# Patient Record
Sex: Female | Born: 1985
Health system: Southern US, Community
[De-identification: ages and names within clinical notes are randomized; demographics above are authoritative.]

## PROBLEM LIST (undated history)

## (undated) ENCOUNTER — Inpatient Hospital Stay (HOSPITAL_COMMUNITY): Payer: Self-pay

## (undated) DIAGNOSIS — N390 Urinary tract infection, site not specified: Secondary | ICD-10-CM

## (undated) DIAGNOSIS — Z21 Asymptomatic human immunodeficiency virus [HIV] infection status: Secondary | ICD-10-CM

## (undated) DIAGNOSIS — B20 Human immunodeficiency virus [HIV] disease: Secondary | ICD-10-CM

## (undated) DIAGNOSIS — F32A Depression, unspecified: Secondary | ICD-10-CM

## (undated) DIAGNOSIS — K649 Unspecified hemorrhoids: Secondary | ICD-10-CM

## (undated) DIAGNOSIS — K819 Cholecystitis, unspecified: Secondary | ICD-10-CM

## (undated) DIAGNOSIS — N83209 Unspecified ovarian cyst, unspecified side: Secondary | ICD-10-CM

## (undated) DIAGNOSIS — D649 Anemia, unspecified: Secondary | ICD-10-CM

## (undated) DIAGNOSIS — I10 Essential (primary) hypertension: Secondary | ICD-10-CM

## (undated) DIAGNOSIS — O139 Gestational [pregnancy-induced] hypertension without significant proteinuria, unspecified trimester: Secondary | ICD-10-CM

## (undated) DIAGNOSIS — R519 Headache, unspecified: Secondary | ICD-10-CM

## (undated) DIAGNOSIS — Z8742 Personal history of other diseases of the female genital tract: Secondary | ICD-10-CM

## (undated) DIAGNOSIS — A630 Anogenital (venereal) warts: Secondary | ICD-10-CM

## (undated) DIAGNOSIS — D573 Sickle-cell trait: Secondary | ICD-10-CM

## (undated) HISTORY — PX: CHOLECYSTECTOMY: SHX55

## (undated) HISTORY — PX: WISDOM TOOTH EXTRACTION: SHX21

## (undated) HISTORY — DX: Anemia, unspecified: D64.9

## (undated) HISTORY — DX: Anogenital (venereal) warts: A63.0

## (undated) HISTORY — DX: Unspecified hemorrhoids: K64.9

## (undated) HISTORY — DX: Cholecystitis, unspecified: K81.9

## (undated) HISTORY — DX: Gestational (pregnancy-induced) hypertension without significant proteinuria, unspecified trimester: O13.9

---
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
## 2005-02-24 ENCOUNTER — Ambulatory Visit: Payer: Self-pay | Admitting: Family Medicine

## 2005-03-06 ENCOUNTER — Ambulatory Visit: Payer: Self-pay | Admitting: Family Medicine

## 2005-04-02 ENCOUNTER — Ambulatory Visit: Payer: Self-pay | Admitting: Family Medicine

## 2005-04-20 ENCOUNTER — Ambulatory Visit: Payer: Self-pay | Admitting: Family Medicine

## 2005-04-20 ENCOUNTER — Encounter: Admission: RE | Admit: 2005-04-20 | Discharge: 2005-04-20 | Payer: Self-pay | Admitting: Sports Medicine

## 2005-04-20 IMAGING — CR DG RIBS W/ CHEST 3+V*R*
3 series · 3 of 3 positions shown · non-contrast
Comparison: none

CLINICAL DATA: Pain lower right ribs. 
 RIGHT VIEWS ? 2 VIEW AND PA CHEST ? 1 VIEW:

[w chest pa]
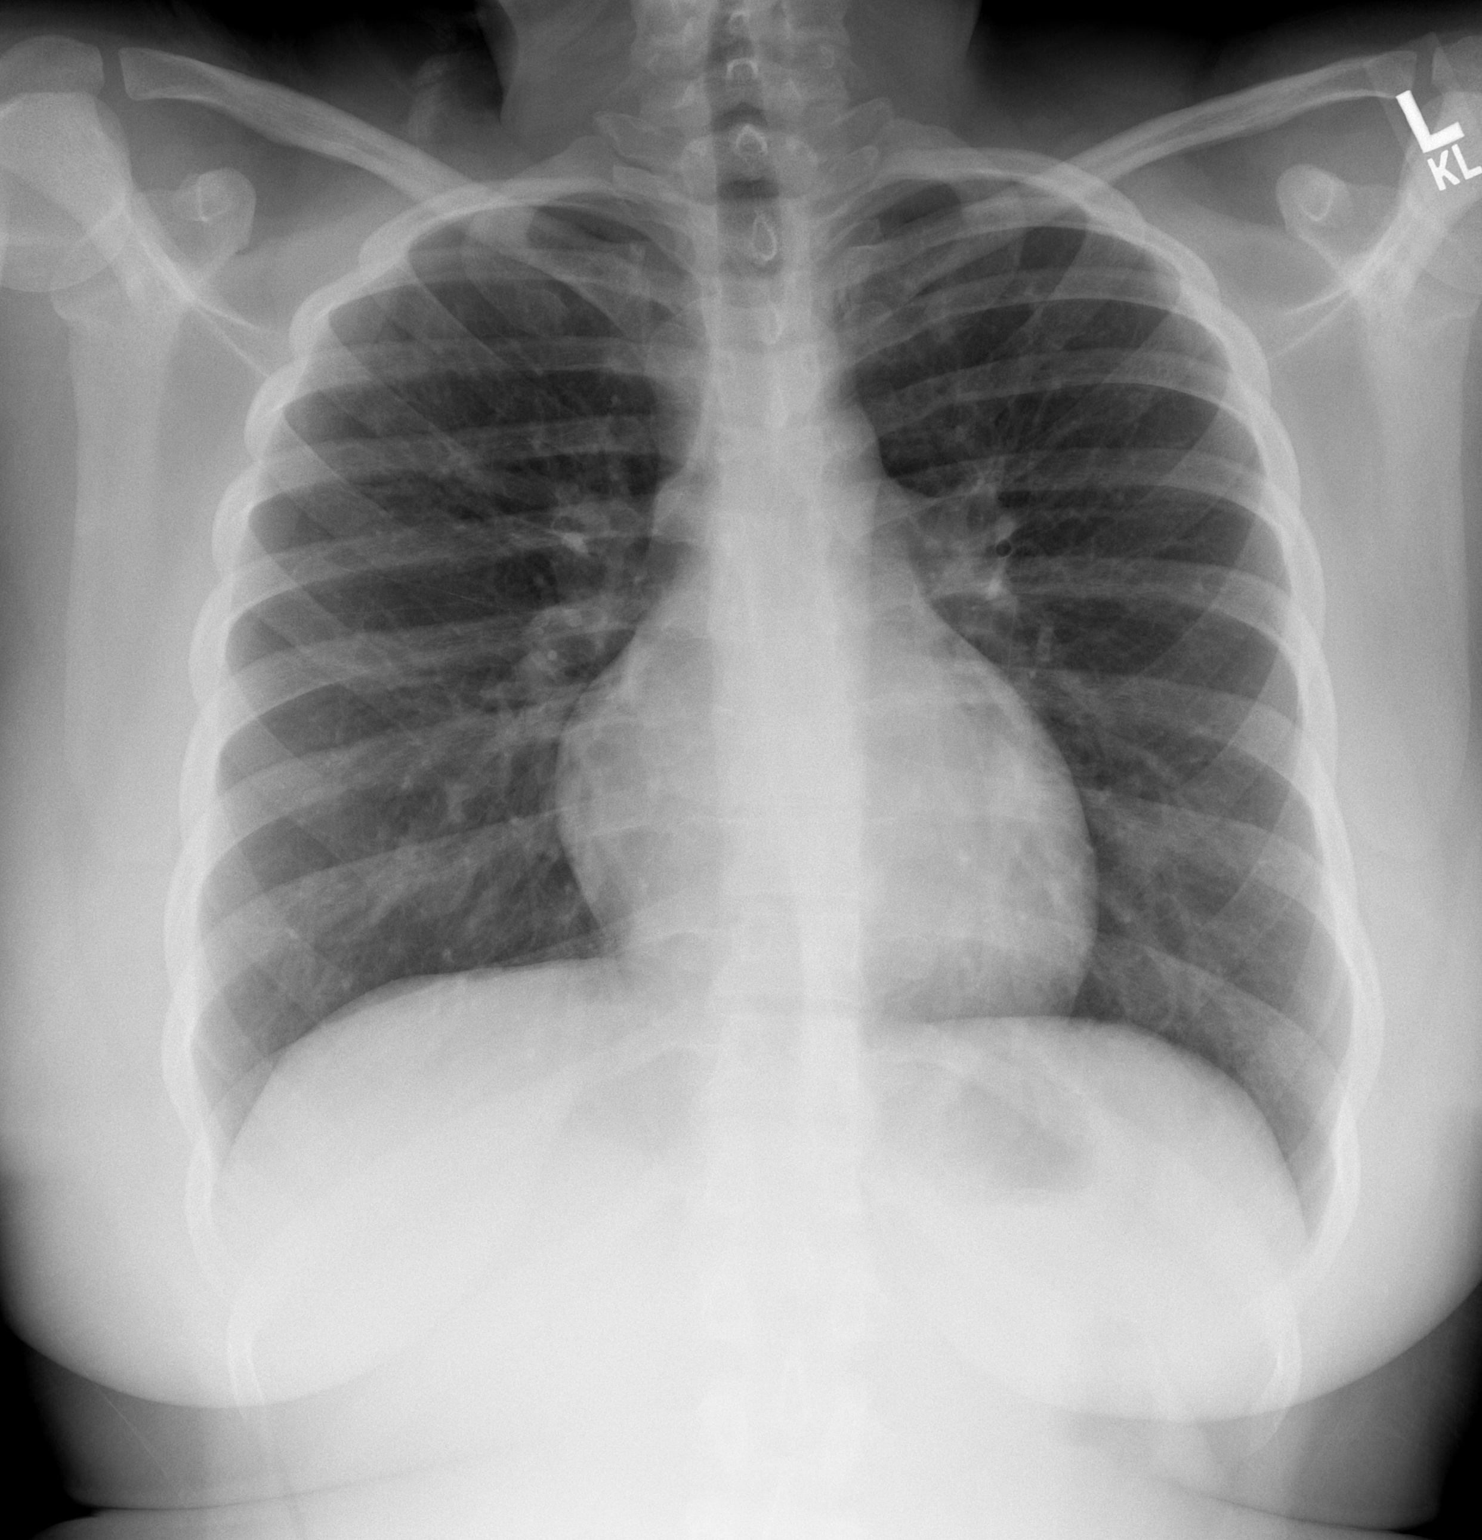

[w ribs ap/pa lower right]
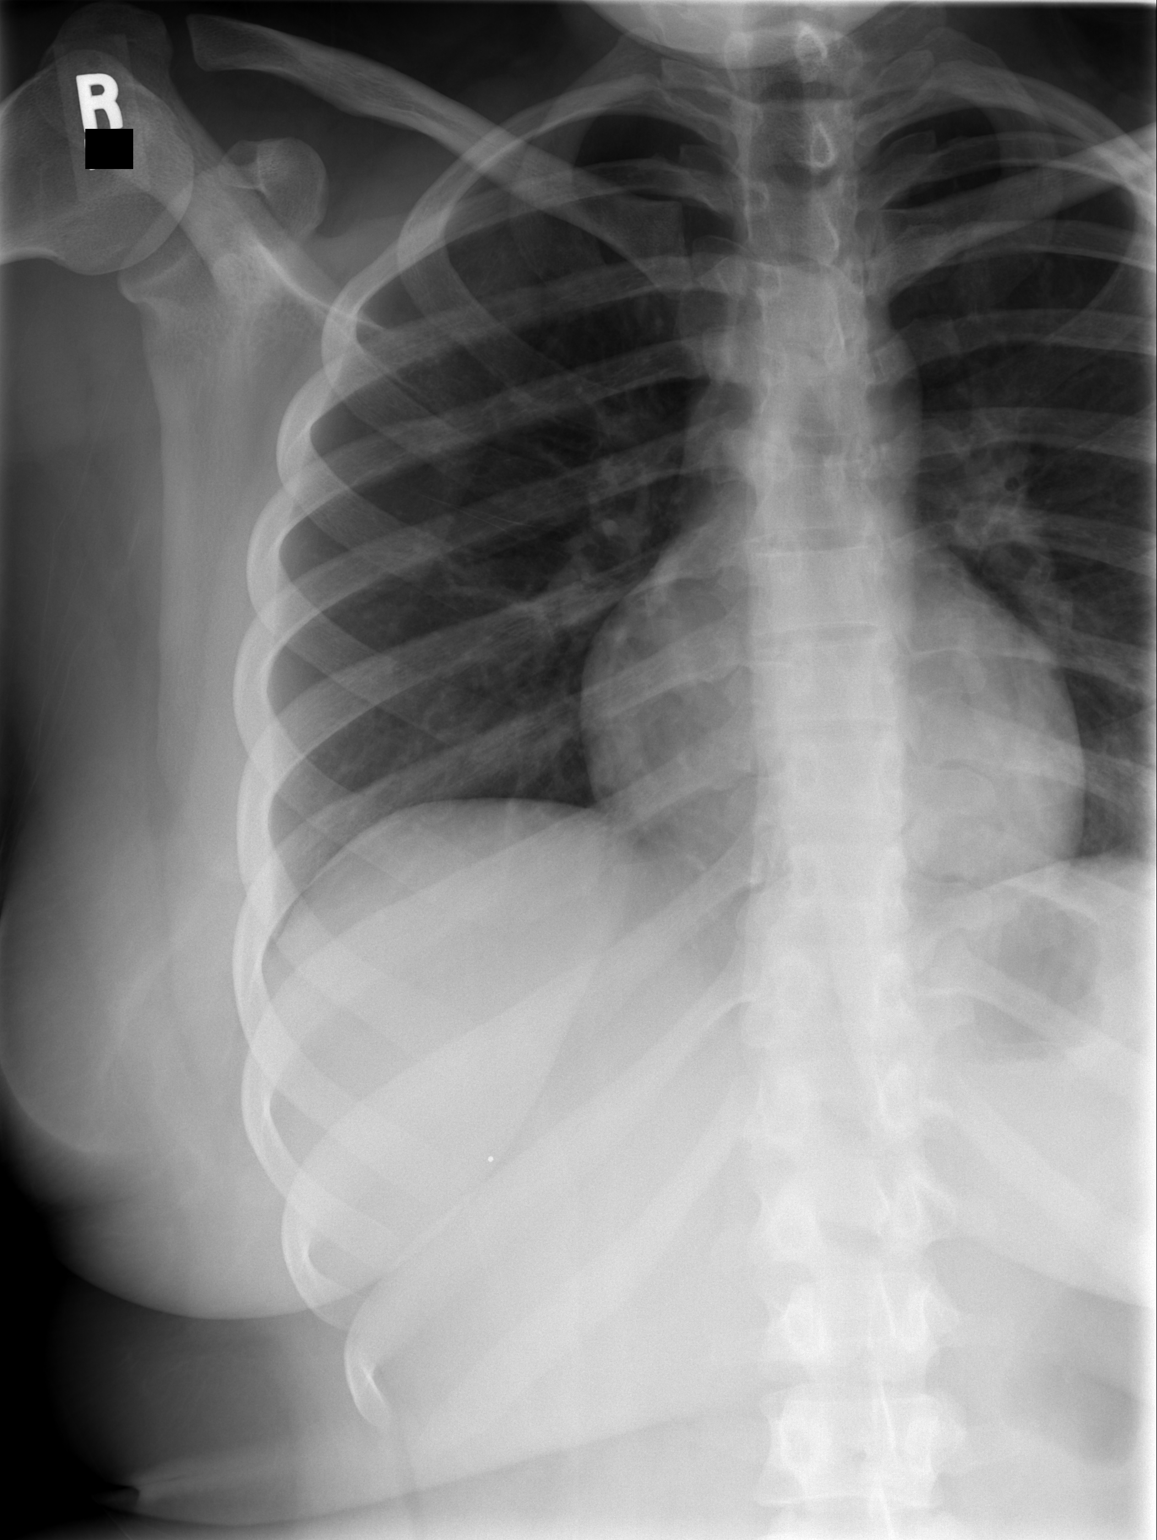

[w ribs oblique right]
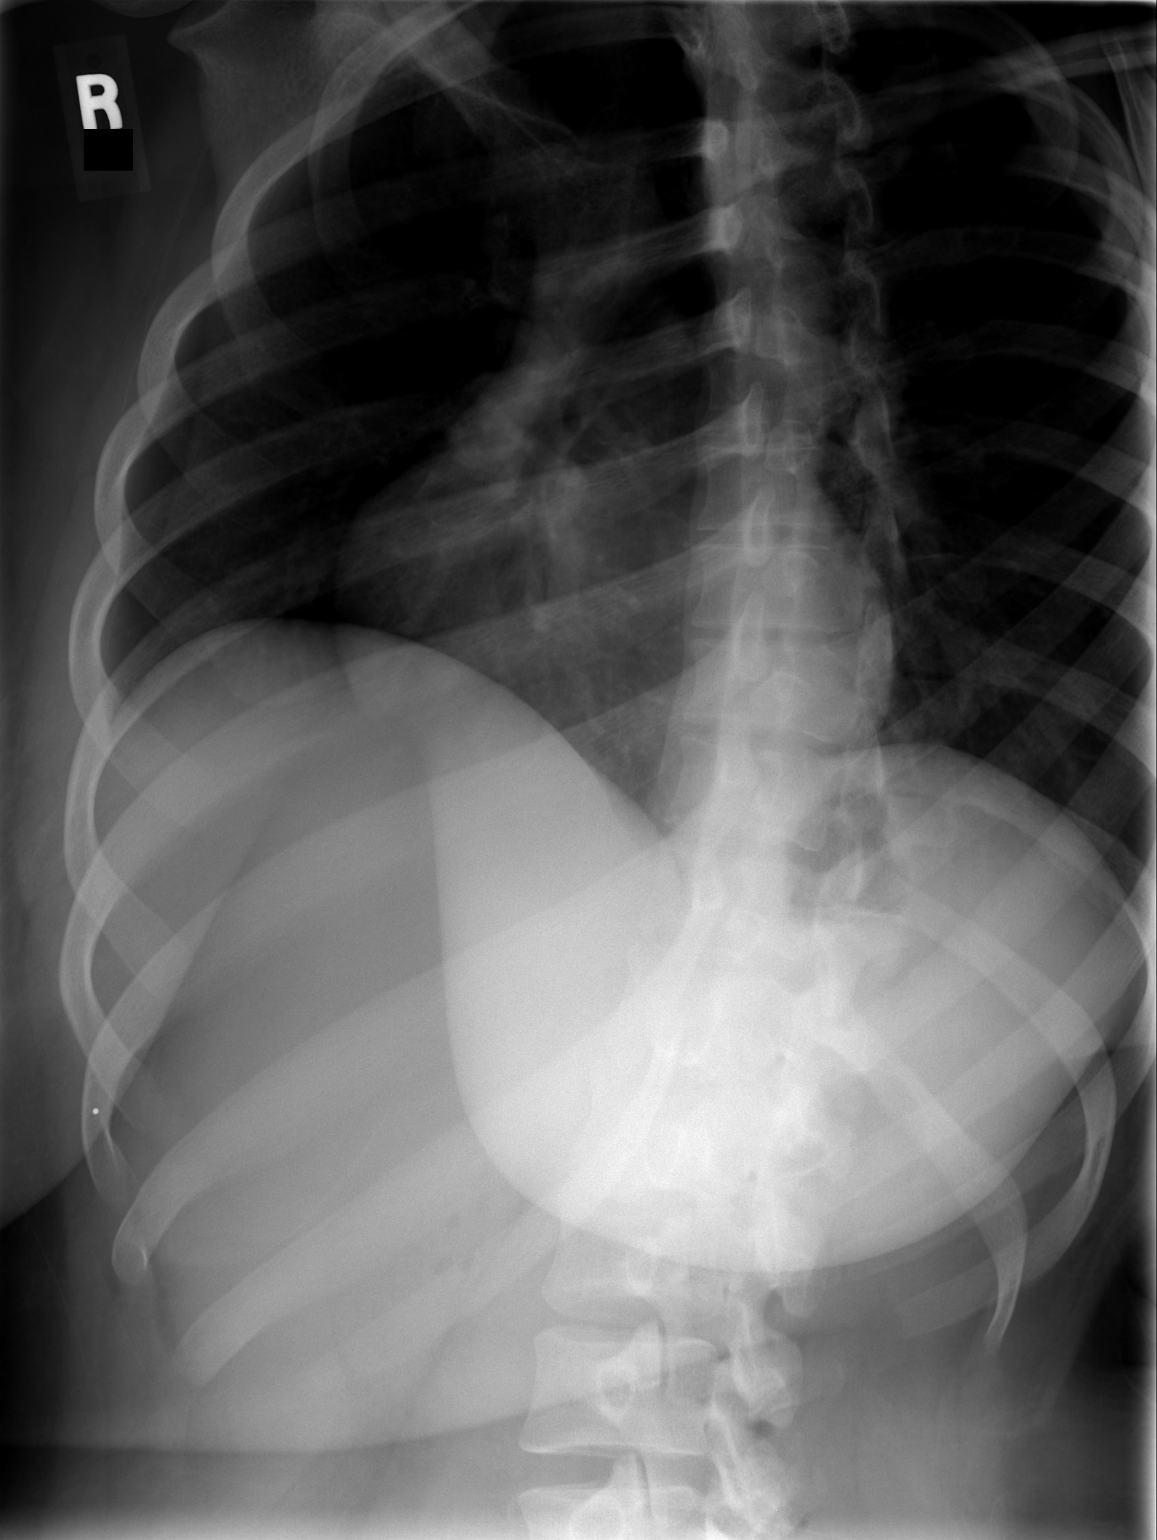

[3 of 3 positions shown; findings below may reference images not displayed]

FINDINGS: There are no fractures.  There are no lytic or blastic lesions, and the bones appear intrinsically normal.  The PA chest view included with the study is normal.
IMPRESSION: Normal study.

## 2005-06-10 ENCOUNTER — Ambulatory Visit: Payer: Self-pay | Admitting: Family Medicine

## 2005-07-10 ENCOUNTER — Ambulatory Visit: Payer: Self-pay | Admitting: Family Medicine

## 2005-07-16 ENCOUNTER — Ambulatory Visit: Payer: Self-pay | Admitting: Family Medicine

## 2005-11-20 ENCOUNTER — Ambulatory Visit: Payer: Self-pay | Admitting: Family Medicine

## 2005-11-26 ENCOUNTER — Ambulatory Visit: Payer: Self-pay | Admitting: Sports Medicine

## 2005-12-11 ENCOUNTER — Ambulatory Visit: Payer: Self-pay | Admitting: Family Medicine

## 2005-12-11 ENCOUNTER — Encounter: Admission: RE | Admit: 2005-12-11 | Discharge: 2005-12-11 | Payer: Self-pay | Admitting: Sports Medicine

## 2006-04-09 ENCOUNTER — Encounter (INDEPENDENT_AMBULATORY_CARE_PROVIDER_SITE_OTHER): Payer: Self-pay | Admitting: *Deleted

## 2006-05-03 ENCOUNTER — Telehealth: Payer: Self-pay | Admitting: *Deleted

## 2006-05-04 ENCOUNTER — Ambulatory Visit: Payer: Self-pay | Admitting: Sports Medicine

## 2006-07-01 ENCOUNTER — Encounter: Payer: Self-pay | Admitting: *Deleted

## 2006-07-01 ENCOUNTER — Ambulatory Visit: Payer: Self-pay | Admitting: Sports Medicine

## 2006-07-01 ENCOUNTER — Encounter: Payer: Self-pay | Admitting: Family Medicine

## 2006-07-01 LAB — CONVERTED CEMR LAB
Nitrite: NEGATIVE
Protein, U semiquant: 30
Urobilinogen, UA: 0.2
pH: 7

## 2006-07-02 ENCOUNTER — Ambulatory Visit: Payer: Self-pay | Admitting: Family Medicine

## 2006-07-02 DIAGNOSIS — N12 Tubulo-interstitial nephritis, not specified as acute or chronic: Secondary | ICD-10-CM | POA: Insufficient documentation

## 2006-07-06 ENCOUNTER — Ambulatory Visit: Payer: Self-pay | Admitting: Family Medicine

## 2006-07-06 ENCOUNTER — Encounter (INDEPENDENT_AMBULATORY_CARE_PROVIDER_SITE_OTHER): Payer: Self-pay | Admitting: Family Medicine

## 2006-07-13 ENCOUNTER — Ambulatory Visit: Payer: Self-pay | Admitting: Sports Medicine

## 2006-07-13 ENCOUNTER — Encounter (INDEPENDENT_AMBULATORY_CARE_PROVIDER_SITE_OTHER): Payer: Self-pay | Admitting: Family Medicine

## 2006-07-13 LAB — CONVERTED CEMR LAB
Blood in Urine, dipstick: NEGATIVE
Protein, U semiquant: NEGATIVE
Specific Gravity, Urine: 1.02

## 2006-10-12 ENCOUNTER — Encounter: Payer: Self-pay | Admitting: Family Medicine

## 2006-10-12 ENCOUNTER — Ambulatory Visit: Payer: Self-pay

## 2006-10-13 LAB — CONVERTED CEMR LAB: Chlamydia, DNA Probe: NEGATIVE

## 2006-11-25 ENCOUNTER — Telehealth (INDEPENDENT_AMBULATORY_CARE_PROVIDER_SITE_OTHER): Payer: Self-pay | Admitting: *Deleted

## 2006-11-26 ENCOUNTER — Encounter (INDEPENDENT_AMBULATORY_CARE_PROVIDER_SITE_OTHER): Payer: Self-pay | Admitting: Family Medicine

## 2006-11-26 ENCOUNTER — Ambulatory Visit: Payer: Self-pay | Admitting: Internal Medicine

## 2006-12-28 ENCOUNTER — Encounter: Payer: Self-pay | Admitting: *Deleted

## 2006-12-28 ENCOUNTER — Ambulatory Visit: Payer: Self-pay | Admitting: Family Medicine

## 2006-12-28 ENCOUNTER — Telehealth (INDEPENDENT_AMBULATORY_CARE_PROVIDER_SITE_OTHER): Payer: Self-pay | Admitting: *Deleted

## 2006-12-29 ENCOUNTER — Encounter (INDEPENDENT_AMBULATORY_CARE_PROVIDER_SITE_OTHER): Payer: Self-pay | Admitting: *Deleted

## 2006-12-30 ENCOUNTER — Telehealth: Payer: Self-pay | Admitting: *Deleted

## 2007-01-12 ENCOUNTER — Encounter: Payer: Self-pay | Admitting: *Deleted

## 2007-03-25 ENCOUNTER — Telehealth: Payer: Self-pay | Admitting: *Deleted

## 2007-03-29 ENCOUNTER — Ambulatory Visit: Payer: Self-pay | Admitting: Family Medicine

## 2007-03-29 DIAGNOSIS — N920 Excessive and frequent menstruation with regular cycle: Secondary | ICD-10-CM | POA: Insufficient documentation

## 2007-03-29 LAB — CONVERTED CEMR LAB
Beta hcg, urine, semiquantitative: NEGATIVE
Bilirubin Urine: NEGATIVE
Hemoglobin: 15.5 g/dL
Ketones, urine, test strip: NEGATIVE
Nitrite: NEGATIVE
Urobilinogen, UA: 0.2

## 2007-11-10 ENCOUNTER — Encounter (INDEPENDENT_AMBULATORY_CARE_PROVIDER_SITE_OTHER): Payer: Self-pay | Admitting: Family Medicine

## 2007-11-10 ENCOUNTER — Ambulatory Visit: Payer: Self-pay | Admitting: Family Medicine

## 2007-11-11 ENCOUNTER — Encounter: Payer: Self-pay | Admitting: Family Medicine

## 2008-01-16 ENCOUNTER — Ambulatory Visit: Payer: Self-pay | Admitting: Family Medicine

## 2008-01-16 ENCOUNTER — Telehealth: Payer: Self-pay | Admitting: *Deleted

## 2008-01-16 ENCOUNTER — Encounter: Payer: Self-pay | Admitting: Family Medicine

## 2008-01-17 ENCOUNTER — Encounter: Payer: Self-pay | Admitting: Family Medicine

## 2008-01-19 ENCOUNTER — Telehealth: Payer: Self-pay | Admitting: *Deleted

## 2008-01-19 LAB — CONVERTED CEMR LAB
Chlamydia, DNA Probe: NEGATIVE
GC Probe Amp, Genital: POSITIVE — AB

## 2008-01-20 ENCOUNTER — Ambulatory Visit: Payer: Self-pay | Admitting: Family Medicine

## 2008-04-04 ENCOUNTER — Emergency Department (HOSPITAL_COMMUNITY): Admission: EM | Admit: 2008-04-04 | Discharge: 2008-04-04 | Payer: Self-pay | Admitting: Emergency Medicine

## 2008-04-04 IMAGING — CT CT HEAD W/O CM
1 series · 16 of 30 positions shown, 20 images · non-contrast
Comparison: None

CLINICAL DATA: Left side headache and left eye pain

CT HEAD WITHOUT CONTRAST
TECHNIQUE: Contiguous axial images were obtained from the base of
the skull through the vertex without contrast.

[Series 2: head_seq 4.5 h37s st · axial · 0.43mm/px · z∈[-147,-21]mm · 16 of 32 slices shown, 20 images]
[im 2/32  brain]
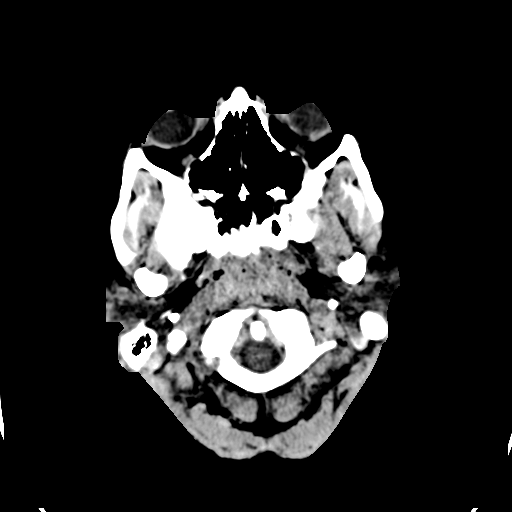
[im 2/32  bone]
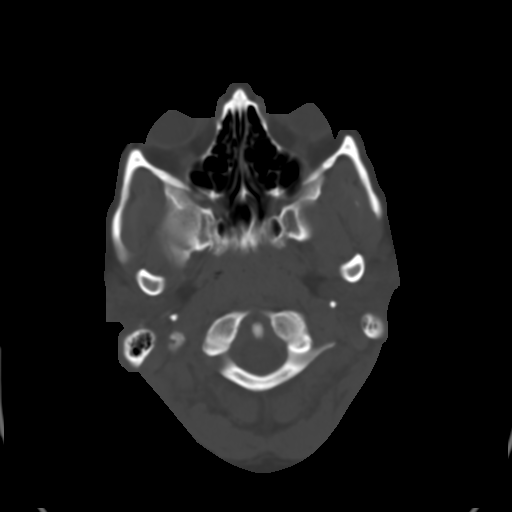
[im 4/32  brain]
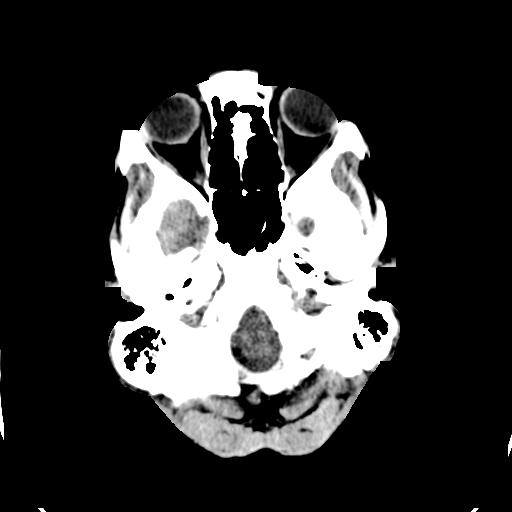
[im 6/32  brain]
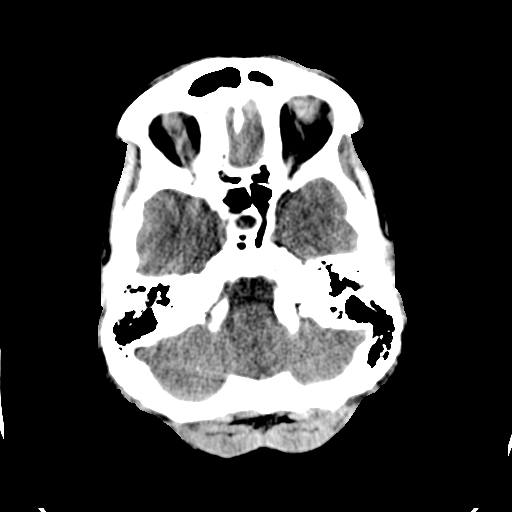
[im 8/32  brain]
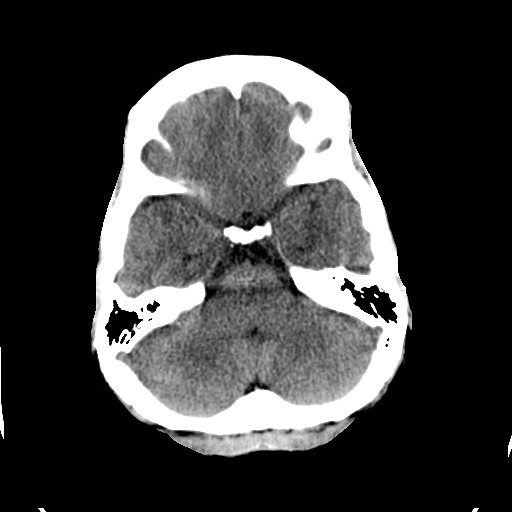
[im 9/32  brain]
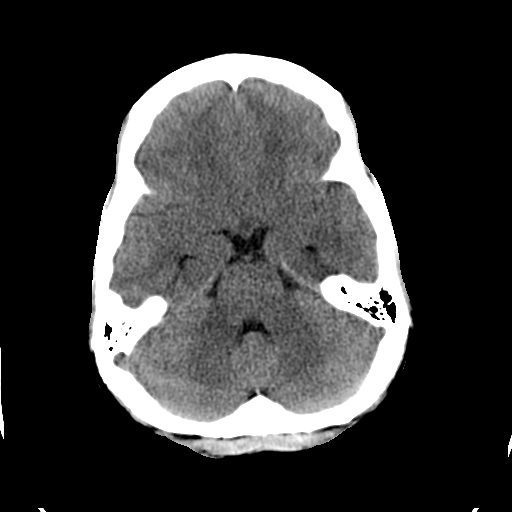
[im 9/32  bone]
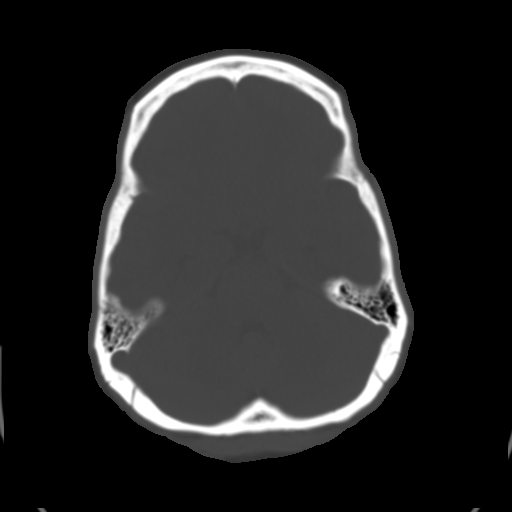
[im 11/32  brain]
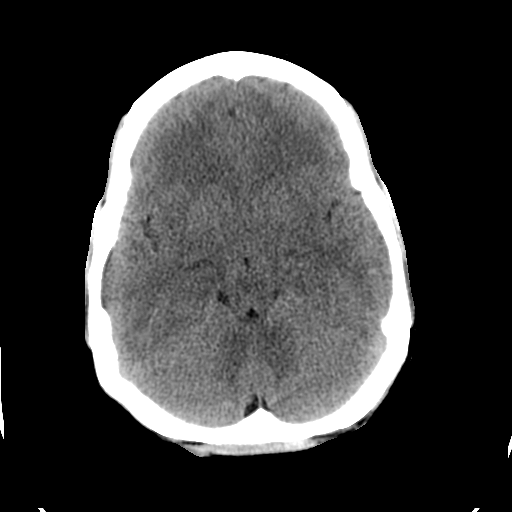
[im 13/32  brain]
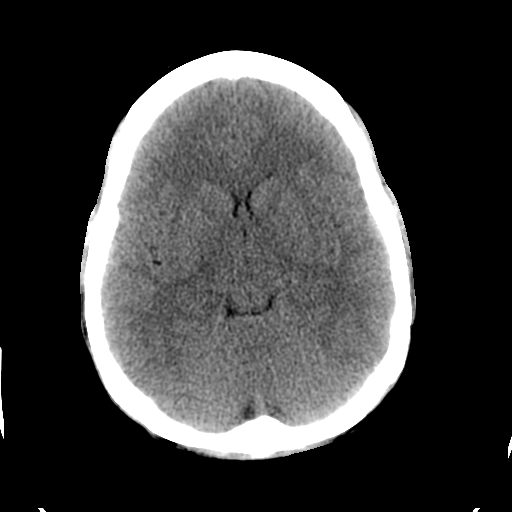
[im 15/32  brain]
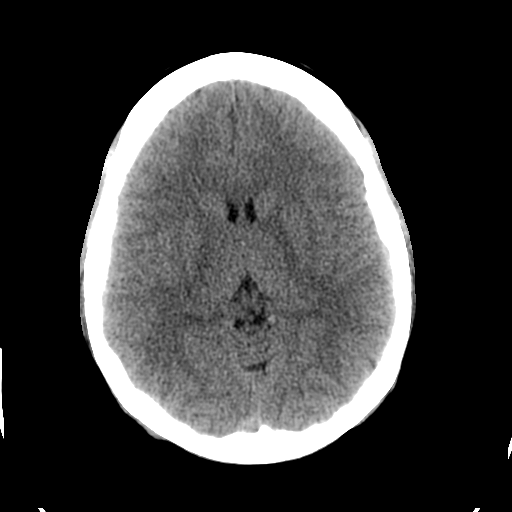
[im 17/32  brain]
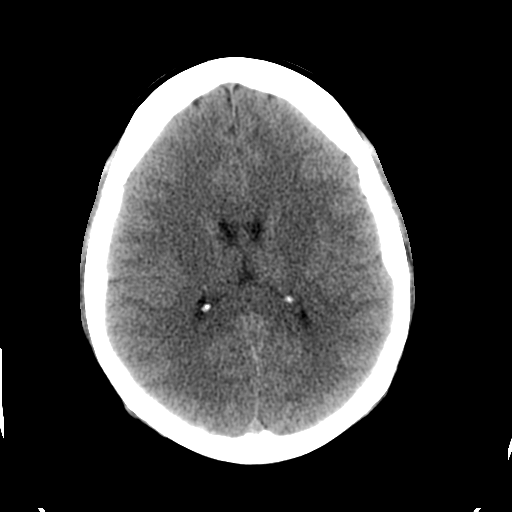
[im 17/32  bone]
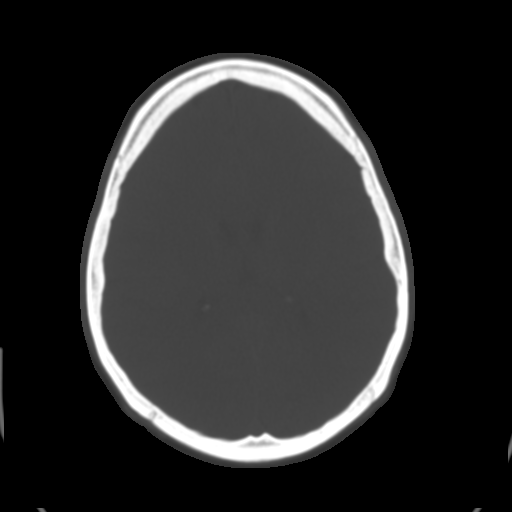
[im 19/32  brain]
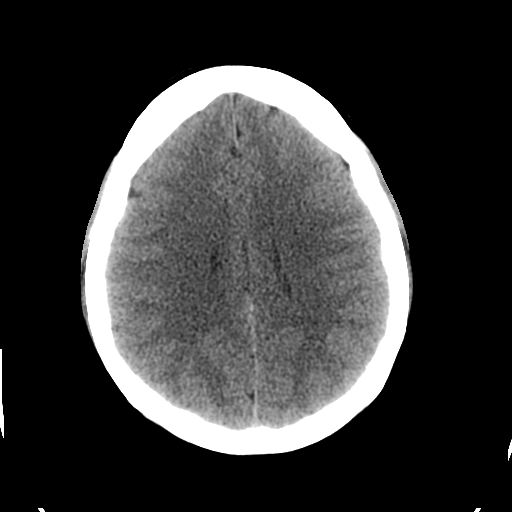
[im 21/32  brain]
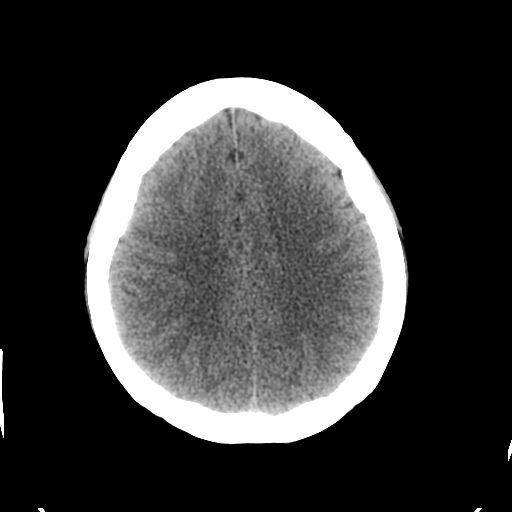
[im 23/32  brain]
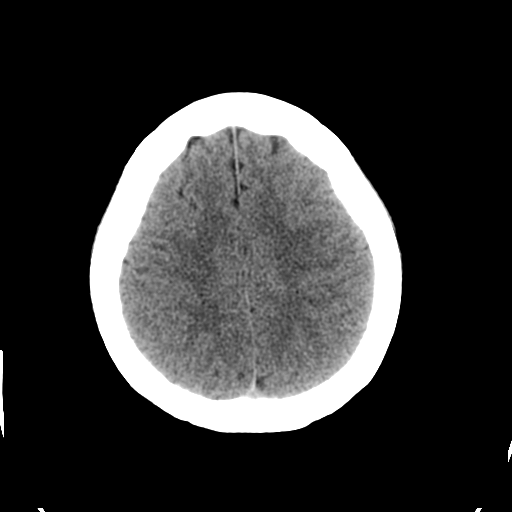
[im 24/32  brain]
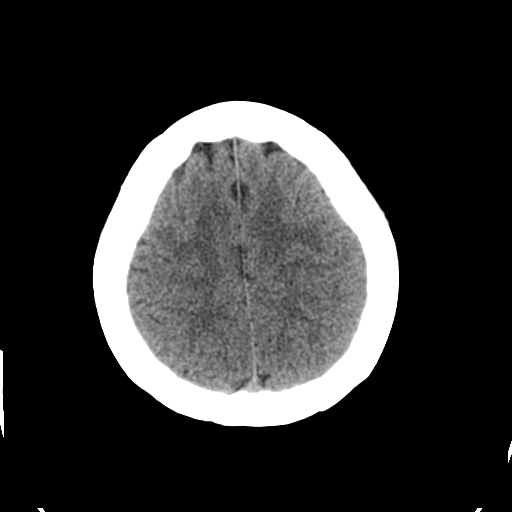
[im 24/32  bone]
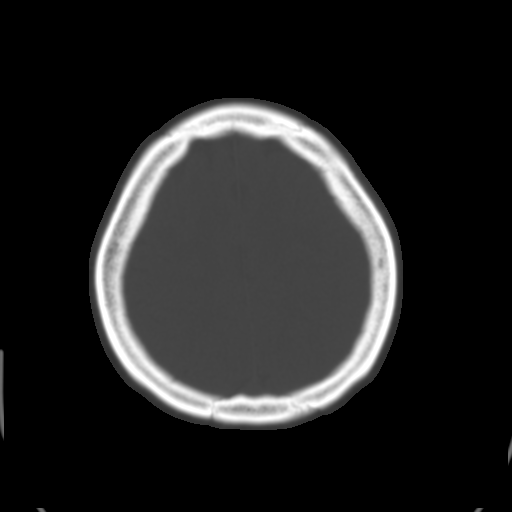
[im 26/32  brain]
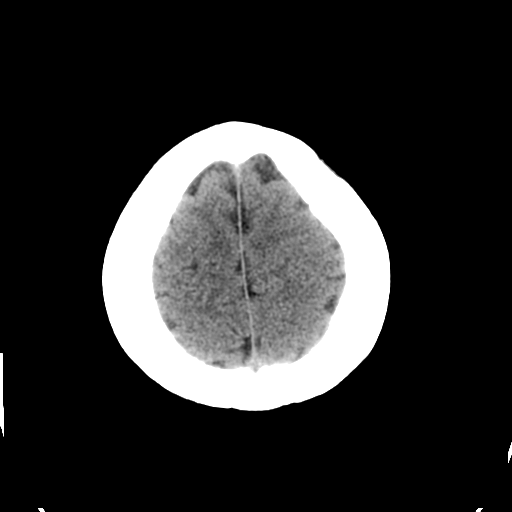
[im 28/32  brain]
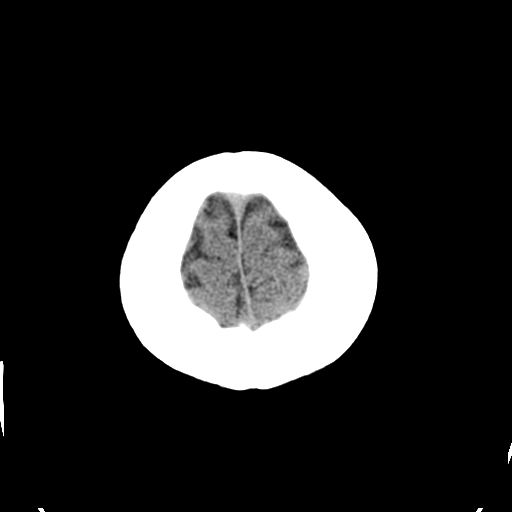
[im 30/32  brain]
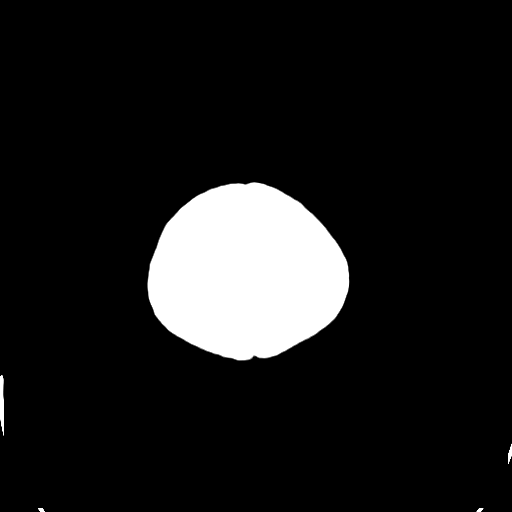

[16 of 30 positions shown; findings below may reference images not displayed]

FINDINGS: Normal ventricular morphology.
No midline shift or mass effect.
Normal appearance of brain parenchyma.
No intracranial hemorrhage, mass lesion, or acute infarct.
Visualized paranasal sinuses and mastoid air cells clear.
Bones unremarkable.
IMPRESSION: No acute intracranial abnormalities.

## 2008-05-09 ENCOUNTER — Encounter (INDEPENDENT_AMBULATORY_CARE_PROVIDER_SITE_OTHER): Payer: Self-pay | Admitting: Family Medicine

## 2008-05-09 ENCOUNTER — Ambulatory Visit: Payer: Self-pay | Admitting: Family Medicine

## 2008-05-09 ENCOUNTER — Other Ambulatory Visit: Admission: RE | Admit: 2008-05-09 | Discharge: 2008-05-09 | Payer: Self-pay | Admitting: Family Medicine

## 2008-05-09 DIAGNOSIS — O99210 Obesity complicating pregnancy, unspecified trimester: Secondary | ICD-10-CM | POA: Insufficient documentation

## 2008-05-09 DIAGNOSIS — A5403 Gonococcal cervicitis, unspecified: Secondary | ICD-10-CM | POA: Insufficient documentation

## 2008-05-09 DIAGNOSIS — F17200 Nicotine dependence, unspecified, uncomplicated: Secondary | ICD-10-CM | POA: Insufficient documentation

## 2008-05-09 DIAGNOSIS — F4321 Adjustment disorder with depressed mood: Secondary | ICD-10-CM | POA: Insufficient documentation

## 2008-05-09 LAB — CONVERTED CEMR LAB
Calcium: 9.1 mg/dL (ref 8.4–10.5)
Chlamydia, DNA Probe: NEGATIVE
Cholesterol: 171 mg/dL (ref 0–200)
GC Probe Amp, Genital: NEGATIVE
Glucose, Bld: 87 mg/dL (ref 70–99)
HDL: 59 mg/dL (ref >39)
LDL Cholesterol: 101 mg/dL — ABNORMAL HIGH (ref 0–99)
Potassium: 4.1 meq/L (ref 3.5–5.3)
Sodium: 141 meq/L (ref 135–145)
TSH: 1.471 microintl units/mL (ref 0.350–4.500)

## 2008-05-14 ENCOUNTER — Encounter (INDEPENDENT_AMBULATORY_CARE_PROVIDER_SITE_OTHER): Payer: Self-pay | Admitting: Family Medicine

## 2008-06-22 ENCOUNTER — Emergency Department (HOSPITAL_COMMUNITY): Admission: EM | Admit: 2008-06-22 | Discharge: 2008-06-22 | Payer: Self-pay | Admitting: Emergency Medicine

## 2008-07-24 ENCOUNTER — Telehealth: Payer: Self-pay | Admitting: Family Medicine

## 2008-09-06 ENCOUNTER — Telehealth: Payer: Self-pay | Admitting: Family Medicine

## 2008-12-13 ENCOUNTER — Emergency Department (HOSPITAL_COMMUNITY): Admission: EM | Admit: 2008-12-13 | Discharge: 2008-12-13 | Payer: Self-pay | Admitting: Emergency Medicine

## 2009-02-20 ENCOUNTER — Emergency Department (HOSPITAL_COMMUNITY): Admission: EM | Admit: 2009-02-20 | Discharge: 2009-02-20 | Payer: Self-pay | Admitting: Emergency Medicine

## 2009-05-07 ENCOUNTER — Telehealth: Payer: Self-pay | Admitting: Family Medicine

## 2009-05-08 ENCOUNTER — Ambulatory Visit: Payer: Self-pay | Admitting: Family Medicine

## 2009-05-08 ENCOUNTER — Encounter: Payer: Self-pay | Admitting: Family Medicine

## 2009-05-08 LAB — CONVERTED CEMR LAB
Beta hcg, urine, semiquantitative: NEGATIVE
Chlamydia, DNA Probe: POSITIVE — AB

## 2009-05-09 ENCOUNTER — Telehealth (INDEPENDENT_AMBULATORY_CARE_PROVIDER_SITE_OTHER): Payer: Self-pay | Admitting: *Deleted

## 2009-05-09 ENCOUNTER — Telehealth: Payer: Self-pay | Admitting: Family Medicine

## 2009-05-09 ENCOUNTER — Encounter: Payer: Self-pay | Admitting: Family Medicine

## 2009-06-22 ENCOUNTER — Emergency Department (HOSPITAL_COMMUNITY): Admission: EM | Admit: 2009-06-22 | Discharge: 2009-06-22 | Payer: Self-pay | Admitting: Emergency Medicine

## 2009-06-22 IMAGING — US US OB TRANSVAGINAL MODIFY
1 series · 13 of 28 positions shown · non-contrast
Comparison: None this gestation.

CLINICAL DATA: 23-year-old G2 P1, unknown LMP, presenting with
pelvic pain.  Quantitative beta HCG pending.

OBSTETRIC <14 WK US AND TRANSVAGINAL OB US [DATE]:
TECHNIQUE: Both transabdominal and transvaginal ultrasound
examinations were performed for complete evaluation of the
gestation as well as the maternal uterus, adnexal regions, and
pelvic cul-de-sac.  Transvaginal imaging was performed as the
transabdominal images did not provide adequate detail.

[Series 1: us ob transvaginal modify · 0.30mm/px · 62 acquisitions, 13 frames shown]
[im 3/62]
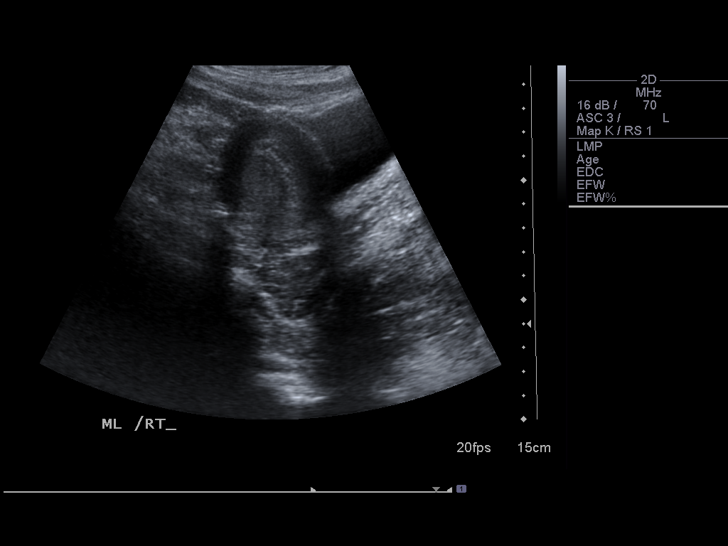
[im 7/62]
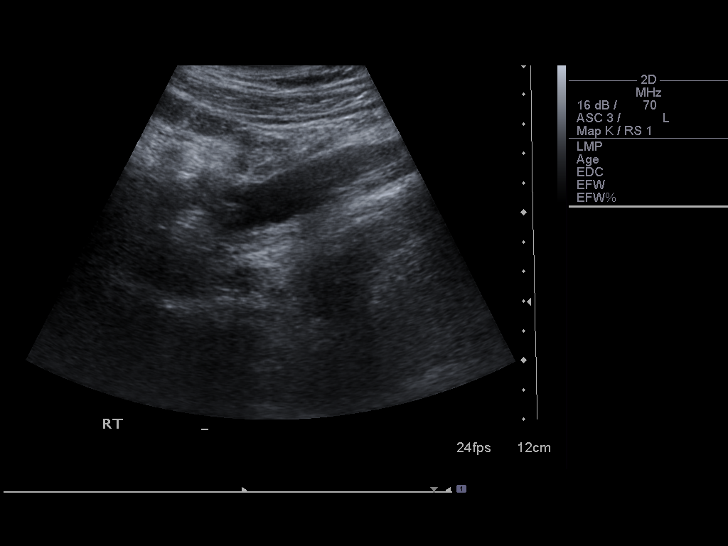
[im 12/62]
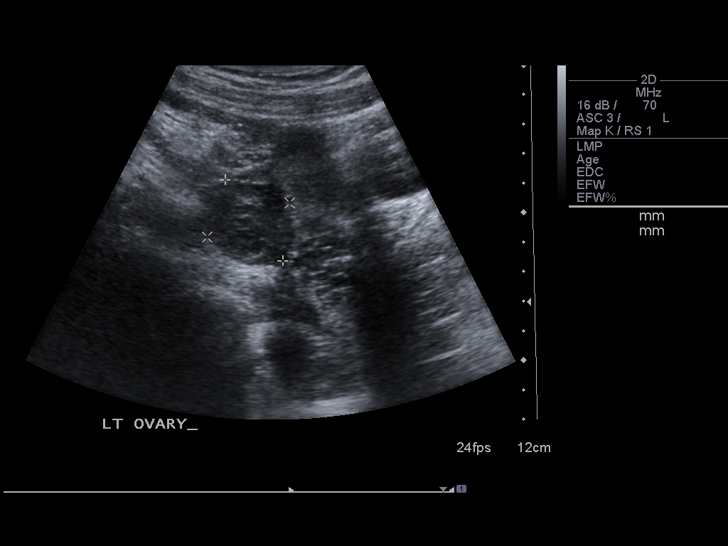
[im 16/62]
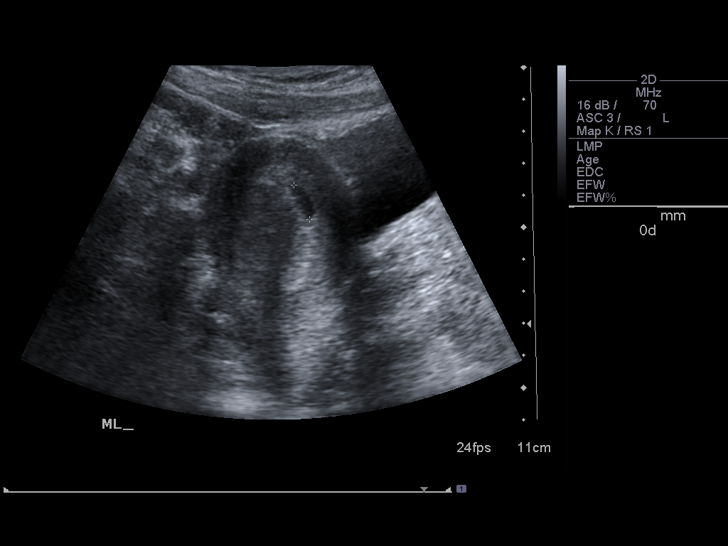
[im 21/62]
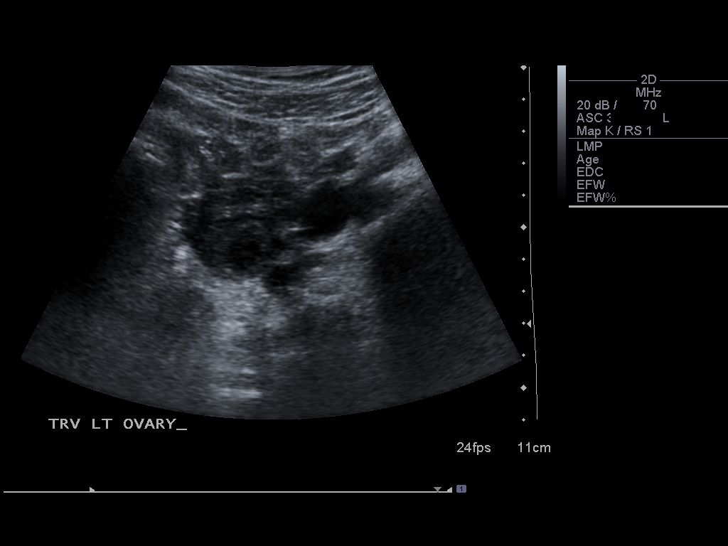
[im 25/62]
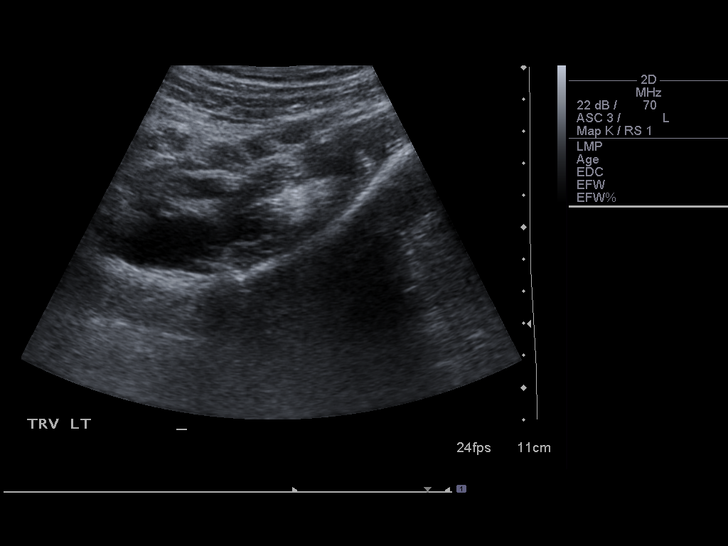
[im 32/62]
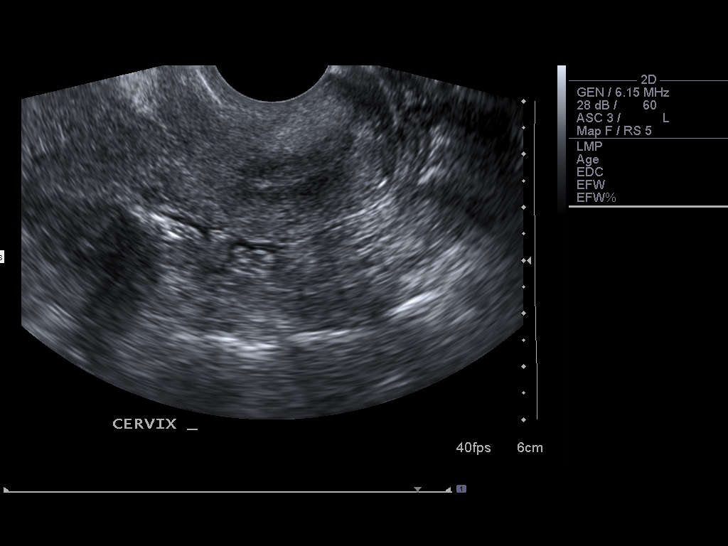
[im 37/62]
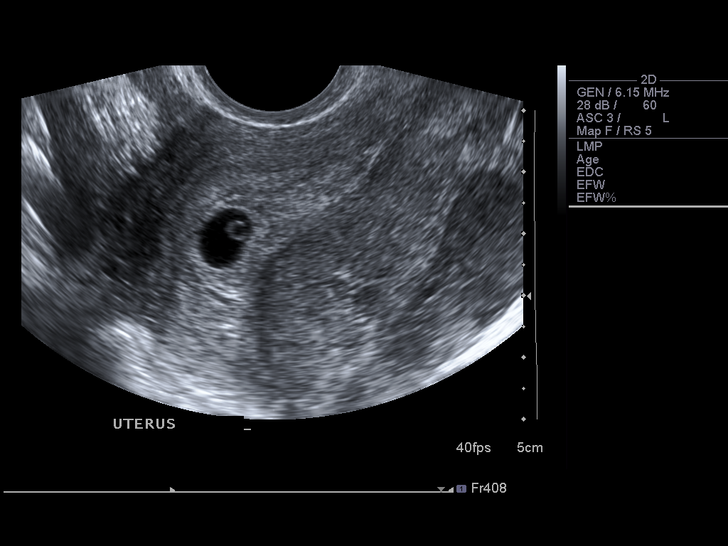
[im 41/62]
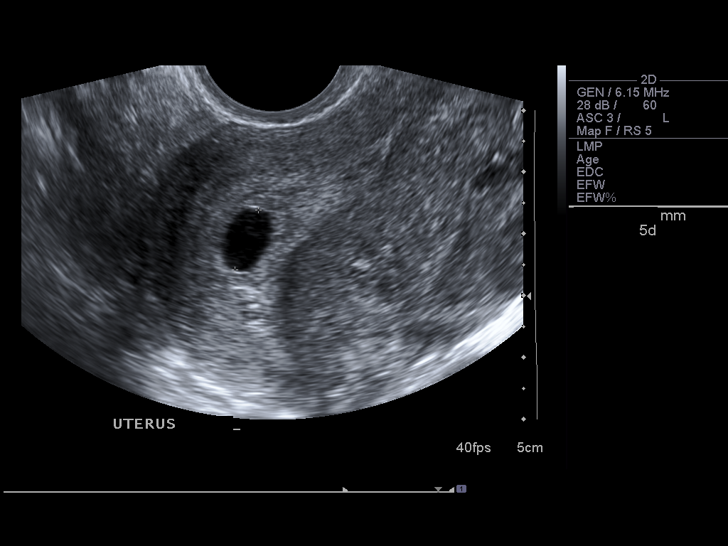
[im 46/62]
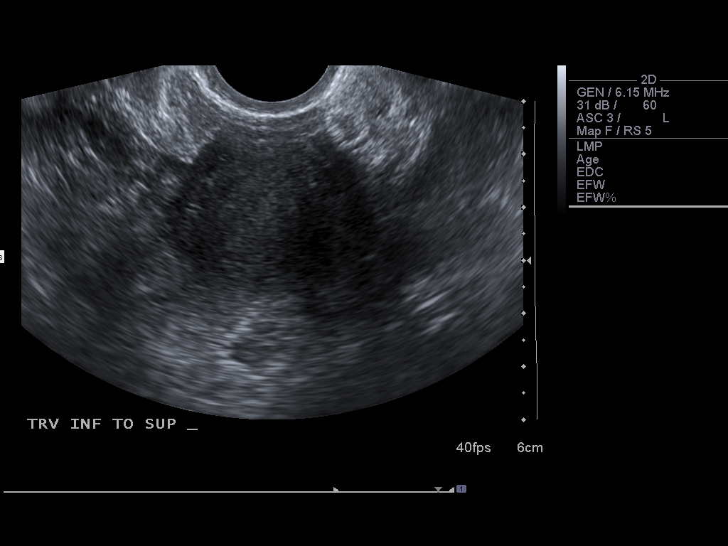
[im 50/62]
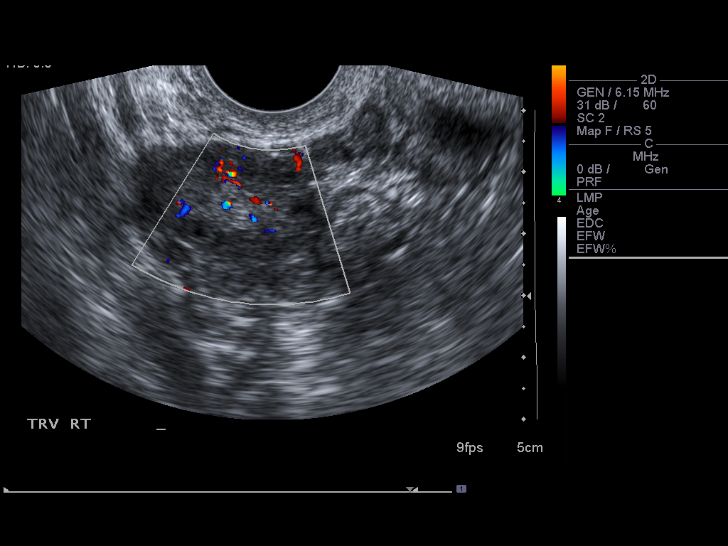
[im 55/62]
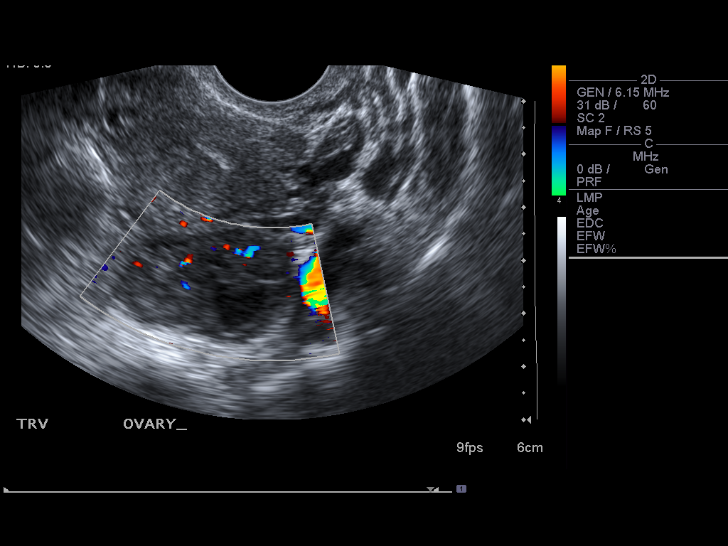
[im 59/62]
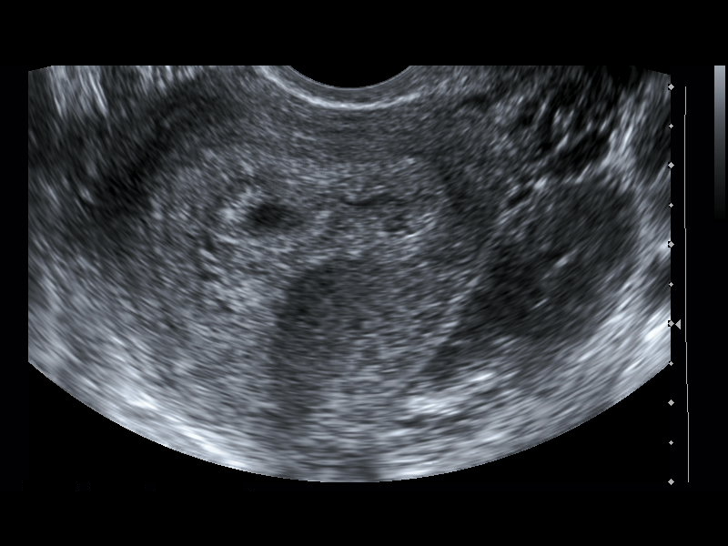

[13 of 28 positions shown; findings below may reference images not displayed]

Intrauterine gestational sac: Single.
Yolk sac: Visualized.
Embryo: Not yet visualized.
Cardiac Activity: Not yet visualized.
Heart Rate: Not applicable.

MSD: 9.9  mm  5 w 5 d
US EDC: [DATE].

Maternal uterus/adnexae:
No subchorionic hemorrhage.  Right ovary normal in appearance
containing several small follicular cysts.  Normal color Doppler
signal within the right ovary.  Left ovary normal in appearance,
containing an approximate 1.1 cm hemorrhagic corpus luteum cyst.
Normal color Doppler signal within the left ovary.  No adnexal
masses or free fluid.
IMPRESSION: 1.  Single intrauterine gestational sac containing a yolk sac.
Embryo not yet visualized. Estimated gestational age of 5 weeks 5
days by mean sac diameter.  It is therefore too early to visualize
the embryo.
2.  Likely hemorrhagic corpus luteum cyst within the left ovary.

## 2009-06-25 ENCOUNTER — Ambulatory Visit: Payer: Self-pay | Admitting: Family Medicine

## 2009-06-25 LAB — CONVERTED CEMR LAB: Beta hcg, urine, semiquantitative: POSITIVE

## 2009-06-26 ENCOUNTER — Ambulatory Visit: Payer: Self-pay | Admitting: Family Medicine

## 2009-06-26 ENCOUNTER — Encounter: Payer: Self-pay | Admitting: Family Medicine

## 2009-06-26 LAB — CONVERTED CEMR LAB
Antibody Screen: NEGATIVE
Basophils Relative: 0 % (ref 0–1)
Hemoglobin: 11.3 g/dL — ABNORMAL LOW (ref 12.0–15.0)
Lymphocytes Relative: 46 % (ref 12–46)
MCV: 79.7 fL (ref 78.0–100.0)
Monocytes Absolute: 0.4 10*3/uL (ref 0.1–1.0)
Neutrophils Relative %: 45 % (ref 43–77)
Platelets: 210 10*3/uL (ref 150–400)
RBC: 4.34 M/uL (ref 3.87–5.11)
Rh Type: POSITIVE
Rubella: 56.9 intl units/mL — ABNORMAL HIGH
Sickle Cell Screen: POSITIVE — AB

## 2009-06-27 ENCOUNTER — Encounter: Payer: Self-pay | Admitting: Family Medicine

## 2009-07-04 ENCOUNTER — Encounter: Payer: Self-pay | Admitting: Family Medicine

## 2009-07-04 ENCOUNTER — Inpatient Hospital Stay (HOSPITAL_COMMUNITY): Admission: AD | Admit: 2009-07-04 | Discharge: 2009-07-04 | Payer: Self-pay | Admitting: Family Medicine

## 2009-07-04 IMAGING — US US OB TRANSVAGINAL
1 series · 14 of 28 positions shown · non-contrast
Comparison: none

OBSTETRICAL ULTRASOUND:
 This ultrasound exam was performed in the [HOSPITAL] Ultrasound Department.  The OB US report was generated in the AS system, and faxed to the ordering physician.  This report is also available in [HOSPITAL]?s Access[employer]ware and in [REDACTED] PACS.

[Series 1: us ob transvaginal · 0.15mm/px · 14 of 38 slices shown]
[im 2/38]
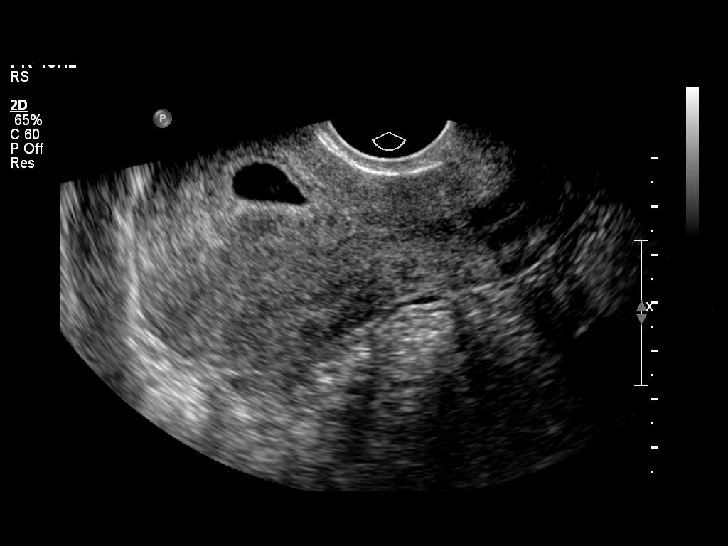
[im 5/38]
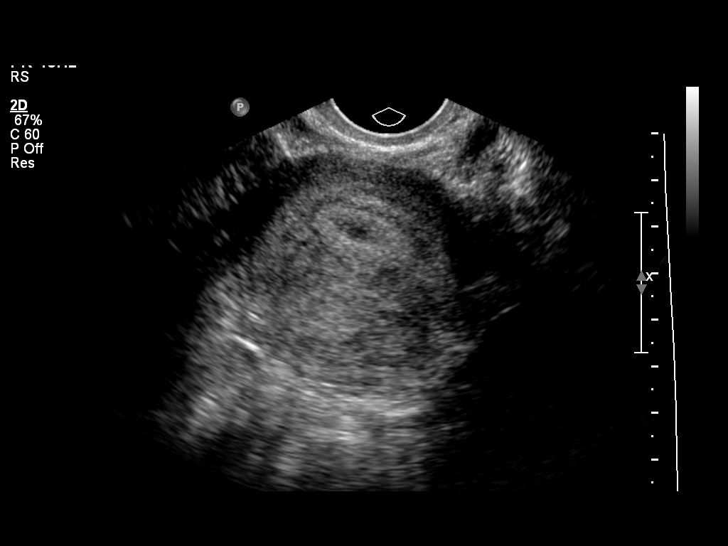
[im 7/38]
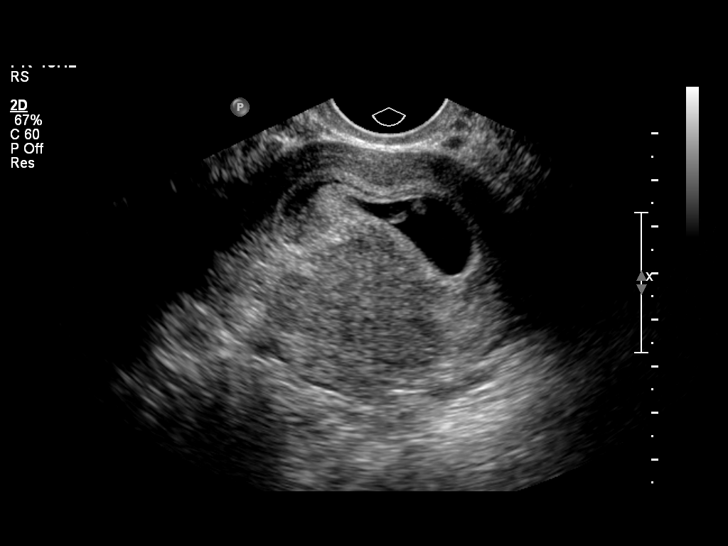
[im 10/38]
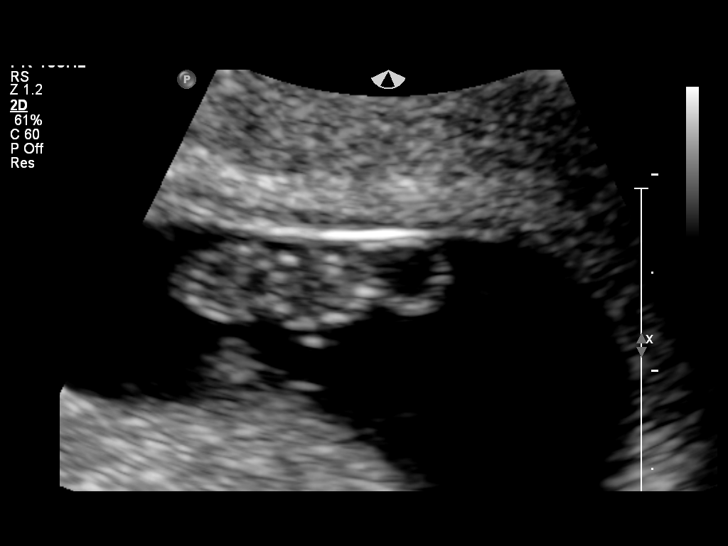
[im 13/38]
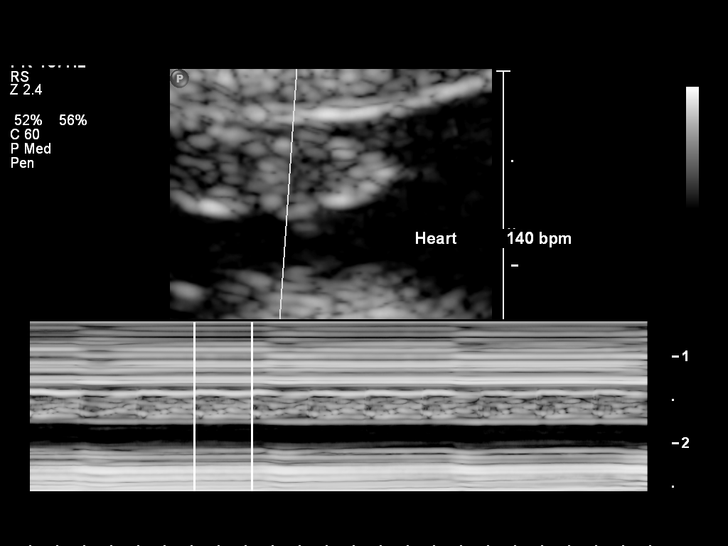
[im 16/38]
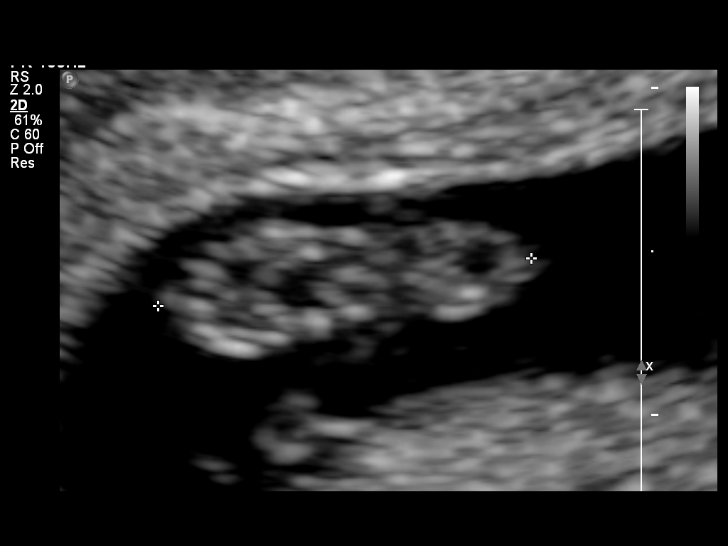
[im 18/38]
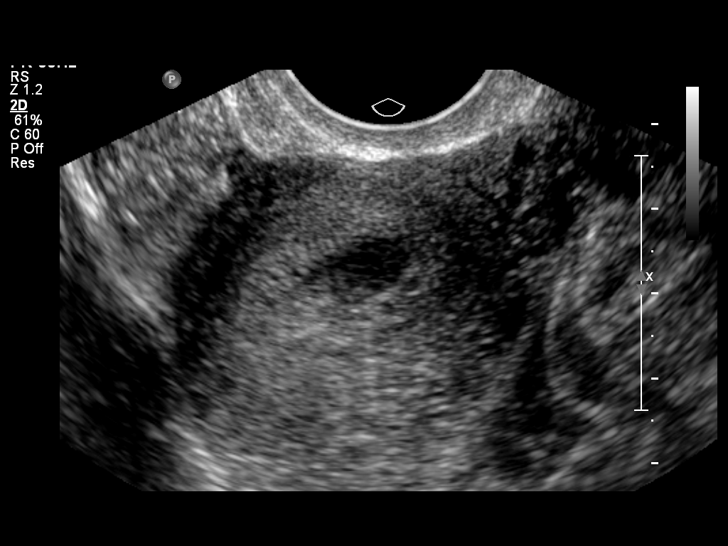
[im 21/38]
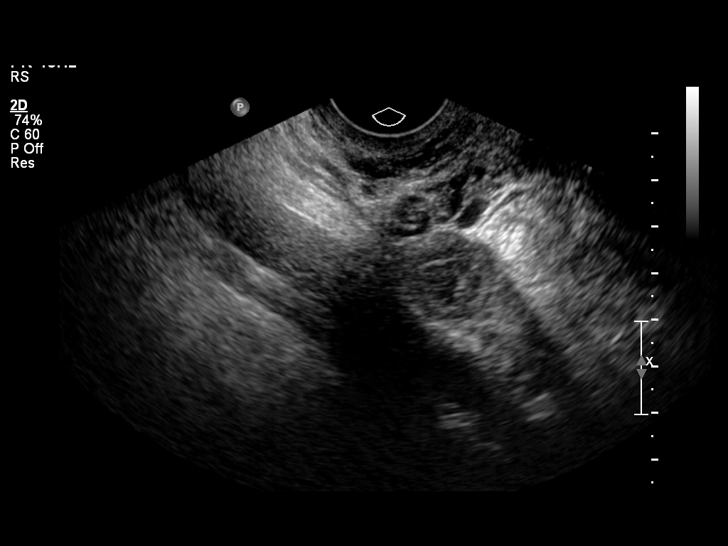
[im 24/38]
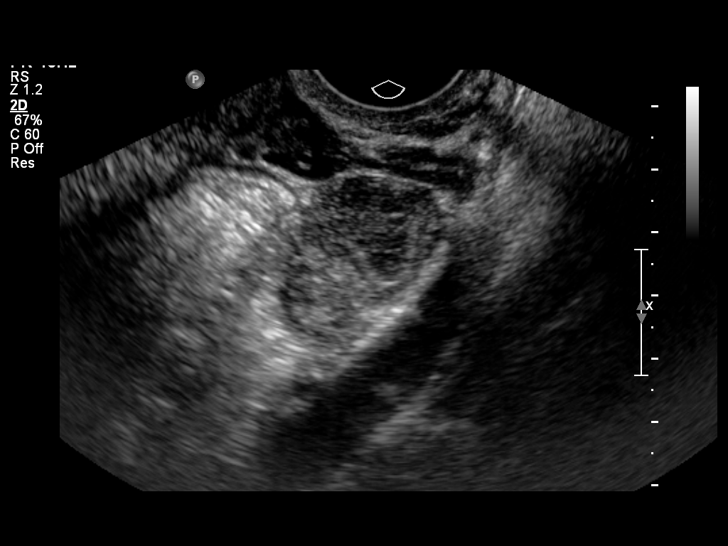
[im 27/38]
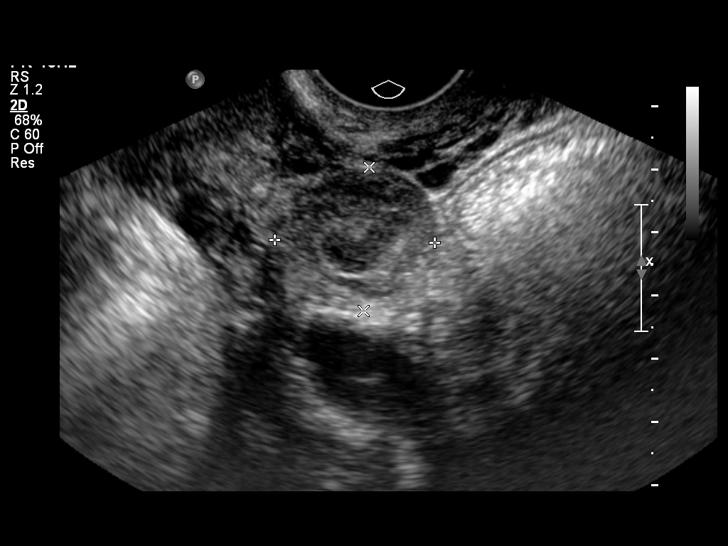
[im 29/38]
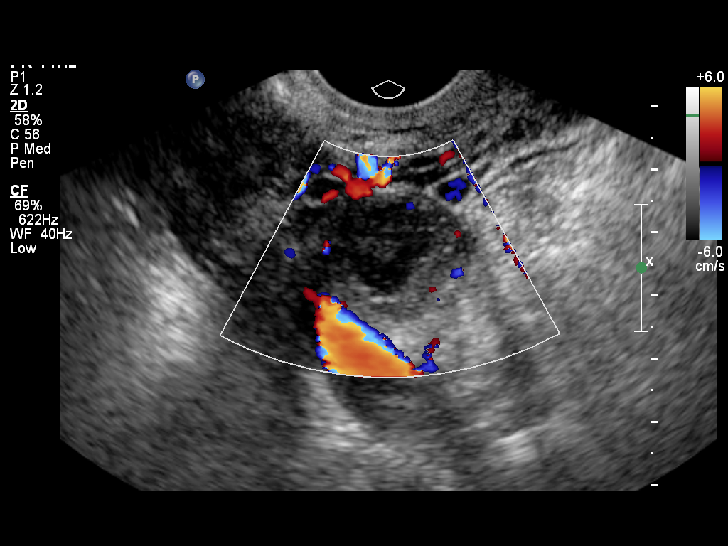
[im 32/38]
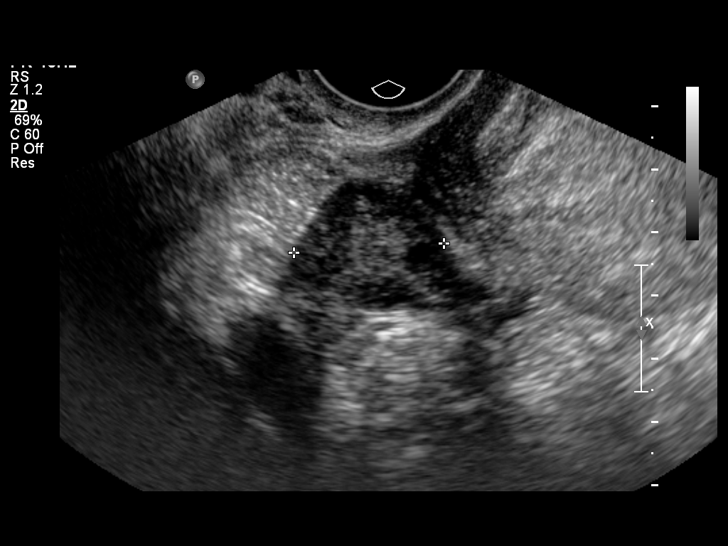
[im 35/38]
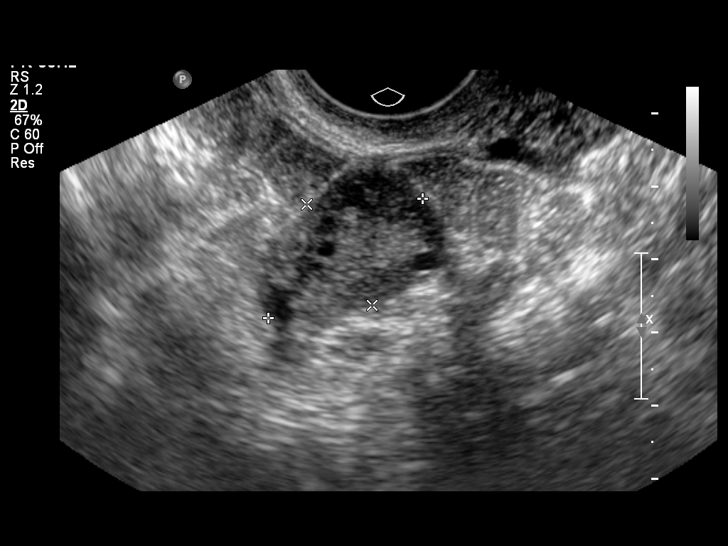
[im 38/38]
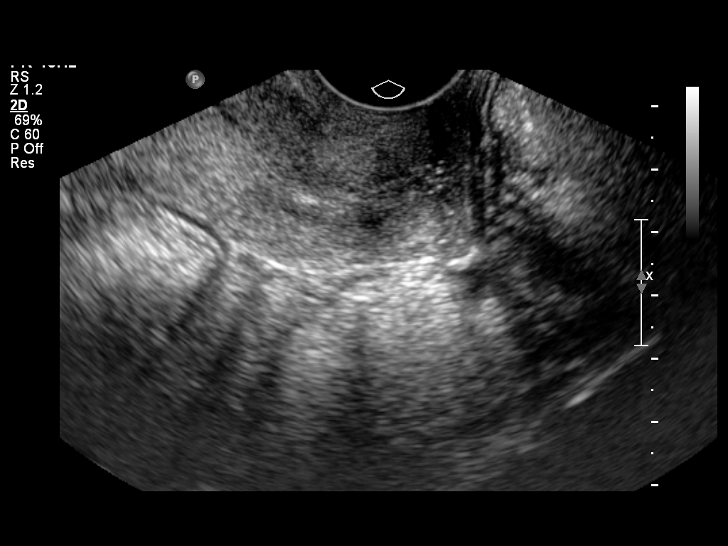

[14 of 28 positions shown; findings below may reference images not displayed]

IMPRESSION: See AS Obstetric US report.

## 2009-07-10 ENCOUNTER — Telehealth: Payer: Self-pay | Admitting: Family Medicine

## 2009-07-10 ENCOUNTER — Encounter: Payer: Self-pay | Admitting: Family Medicine

## 2009-07-10 ENCOUNTER — Ambulatory Visit: Payer: Self-pay | Admitting: Family Medicine

## 2009-07-10 ENCOUNTER — Other Ambulatory Visit: Admission: RE | Admit: 2009-07-10 | Discharge: 2009-07-10 | Payer: Self-pay | Admitting: Family Medicine

## 2009-07-10 LAB — CONVERTED CEMR LAB
Chlamydia, DNA Probe: NEGATIVE
GC Probe Amp, Genital: NEGATIVE
Pap Smear: NEGATIVE

## 2009-07-26 ENCOUNTER — Telehealth: Payer: Self-pay | Admitting: Family Medicine

## 2009-08-13 ENCOUNTER — Ambulatory Visit: Payer: Self-pay | Admitting: Family Medicine

## 2009-08-13 DIAGNOSIS — K089 Disorder of teeth and supporting structures, unspecified: Secondary | ICD-10-CM | POA: Insufficient documentation

## 2009-08-13 LAB — CONVERTED CEMR LAB
Urobilinogen, UA: 0.2
WBC Urine, dipstick: NEGATIVE

## 2009-08-21 ENCOUNTER — Telehealth (INDEPENDENT_AMBULATORY_CARE_PROVIDER_SITE_OTHER): Payer: Self-pay | Admitting: *Deleted

## 2009-08-22 ENCOUNTER — Ambulatory Visit: Payer: Self-pay | Admitting: Family Medicine

## 2009-08-22 DIAGNOSIS — S025XXA Fracture of tooth (traumatic), initial encounter for closed fracture: Secondary | ICD-10-CM | POA: Insufficient documentation

## 2009-09-10 ENCOUNTER — Encounter: Payer: Self-pay | Admitting: Family Medicine

## 2009-09-10 ENCOUNTER — Ambulatory Visit: Payer: Self-pay | Admitting: Family Medicine

## 2009-09-10 DIAGNOSIS — R300 Dysuria: Secondary | ICD-10-CM

## 2009-09-10 DIAGNOSIS — N898 Other specified noninflammatory disorders of vagina: Secondary | ICD-10-CM | POA: Insufficient documentation

## 2009-09-10 LAB — CONVERTED CEMR LAB
Bilirubin Urine: NEGATIVE
Blood in Urine, dipstick: NEGATIVE
Glucose, Urine, Semiquant: NEGATIVE
Ketones, urine, test strip: NEGATIVE
Nitrite: NEGATIVE
Protein, U semiquant: NEGATIVE
Urobilinogen, UA: 0.2
WBC Urine, dipstick: NEGATIVE
pH: 6

## 2009-09-11 ENCOUNTER — Encounter: Payer: Self-pay | Admitting: Family Medicine

## 2009-09-12 ENCOUNTER — Ambulatory Visit: Payer: Self-pay | Admitting: Family Medicine

## 2009-09-17 ENCOUNTER — Telehealth: Payer: Self-pay | Admitting: *Deleted

## 2009-09-17 ENCOUNTER — Ambulatory Visit (HOSPITAL_COMMUNITY): Admission: RE | Admit: 2009-09-17 | Discharge: 2009-09-17 | Payer: Self-pay | Admitting: Family Medicine

## 2009-09-17 ENCOUNTER — Encounter: Payer: Self-pay | Admitting: Family Medicine

## 2009-09-17 IMAGING — US US OB DETAIL+14 WK
2 series · 14 of 28 positions shown · non-contrast
Comparison: none

OBSTETRICAL ULTRASOUND:
 This ultrasound exam was performed in the [HOSPITAL] Ultrasound Department.  The OB US report was generated in the AS system, and faxed to the ordering physician.  This report is also available in [HOSPITAL]?s [employer] and in [REDACTED] PACS.

[Series 1: us ob detail +14 wk · 0.25mm/px · 55 acquisitions, 13 frames shown (1 of 2)]
[im 3/55]
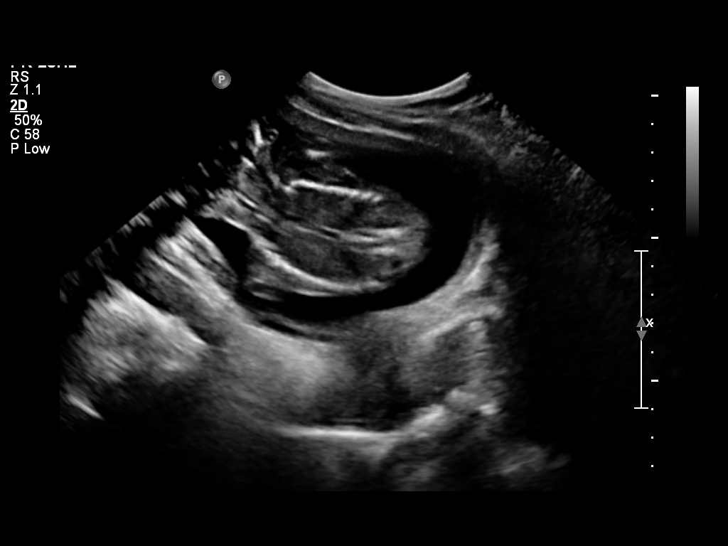
[im 7/55]
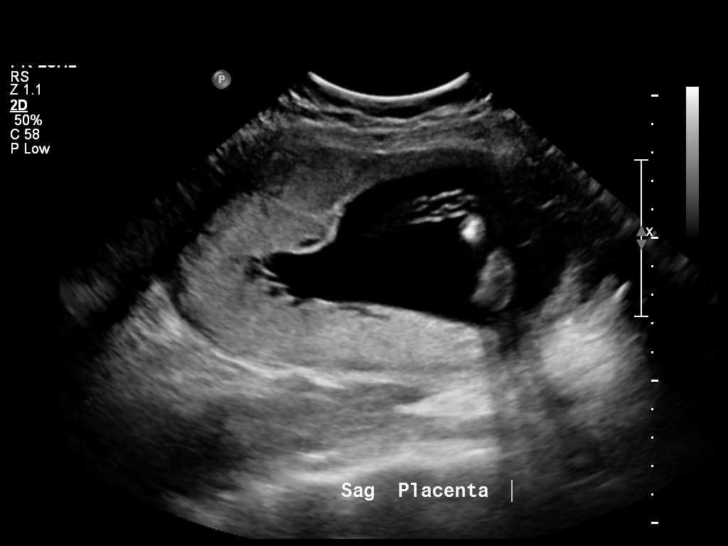
[im 11/55]
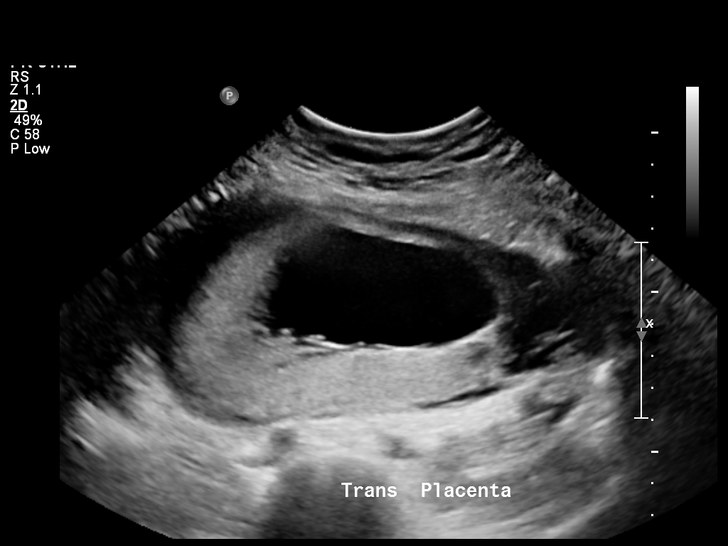
[im 15/55]
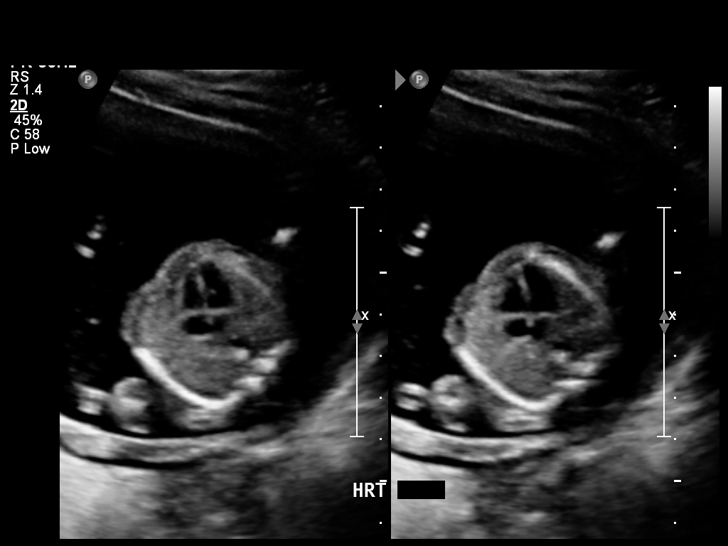
[im 19/55]
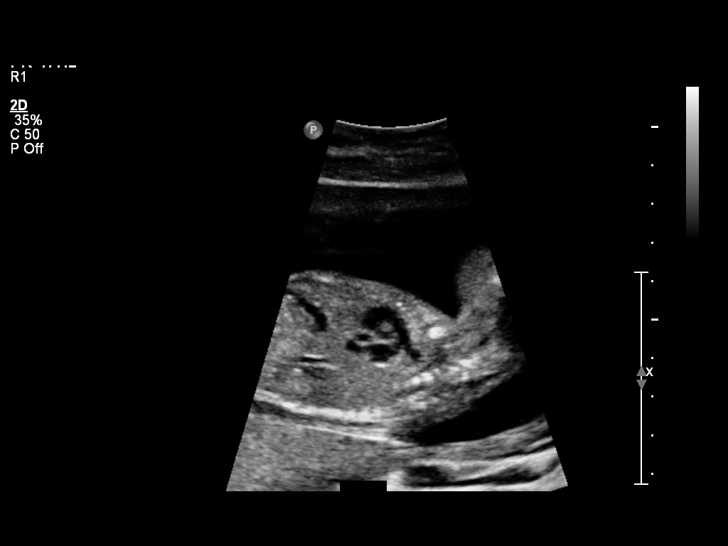
[im 23/55]
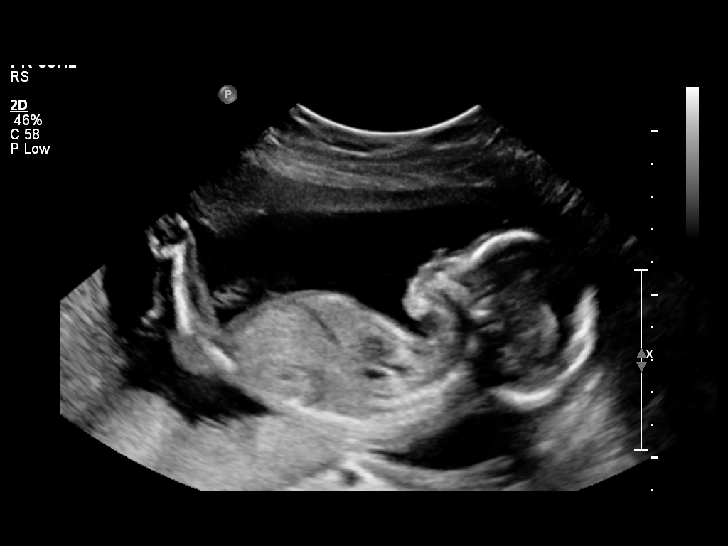
[im 28/55]
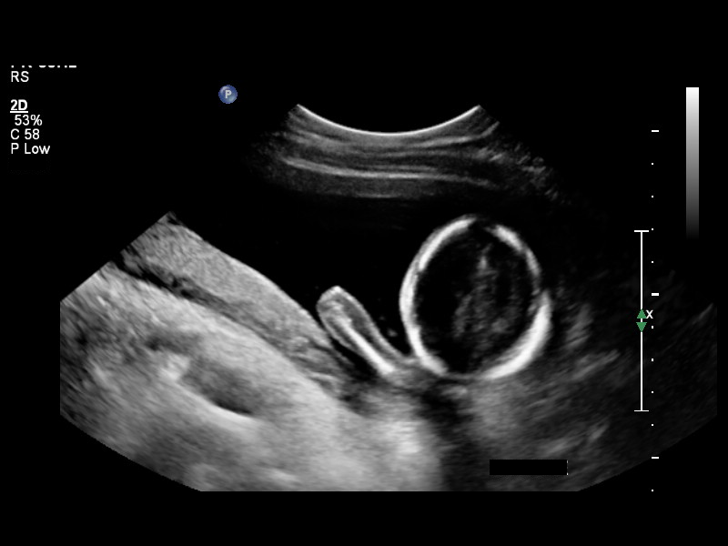
[im 32/55]
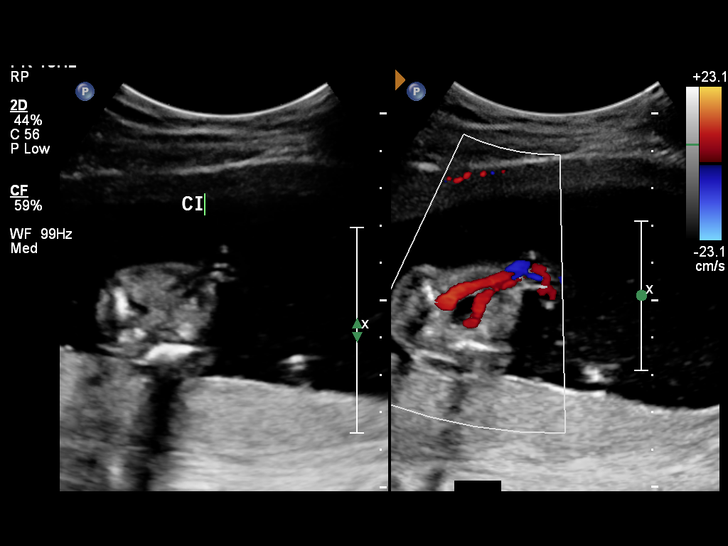
[im 36/55]
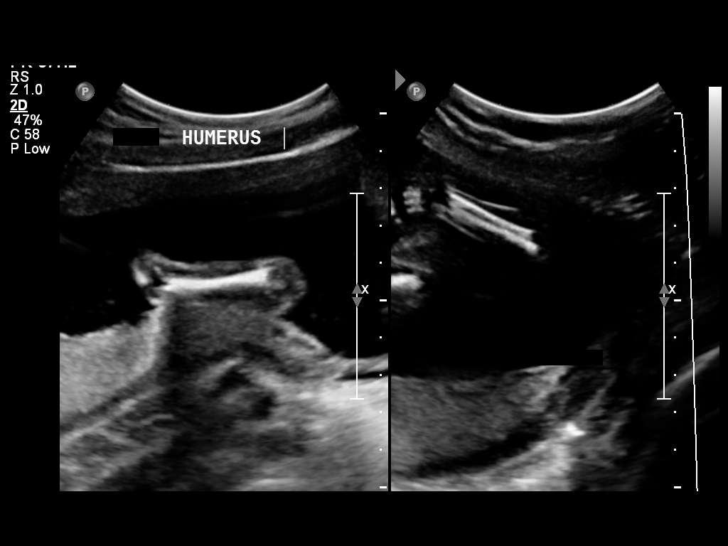
[im 40/55]
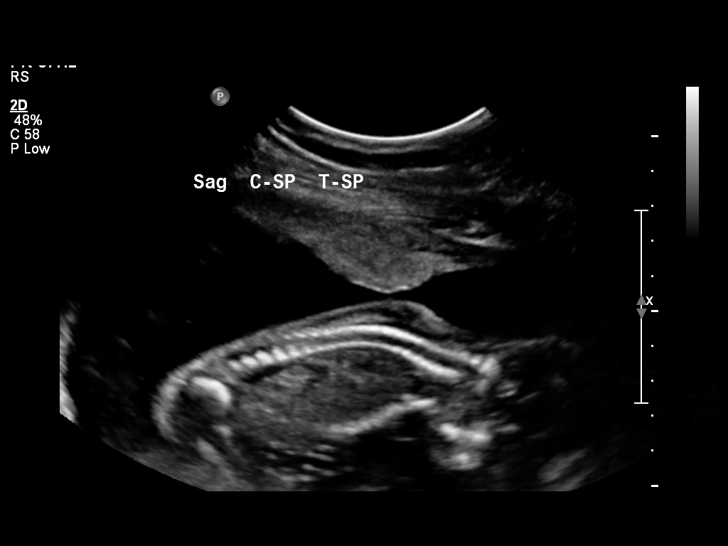
[im 44/55]
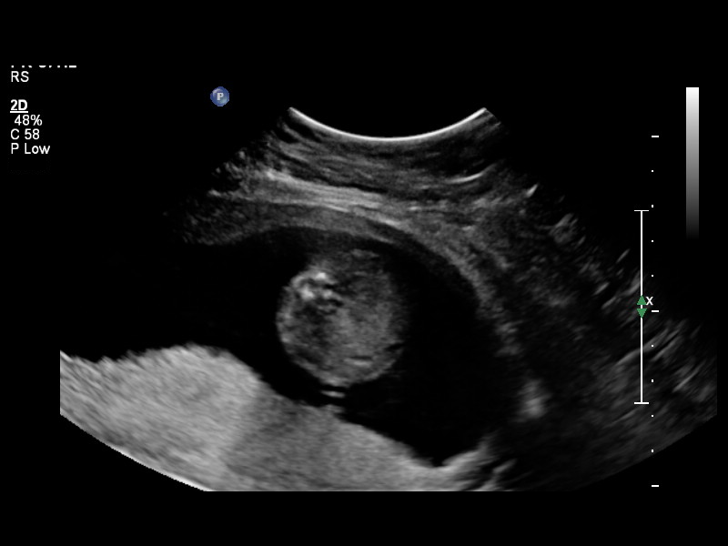
[im 48/55]
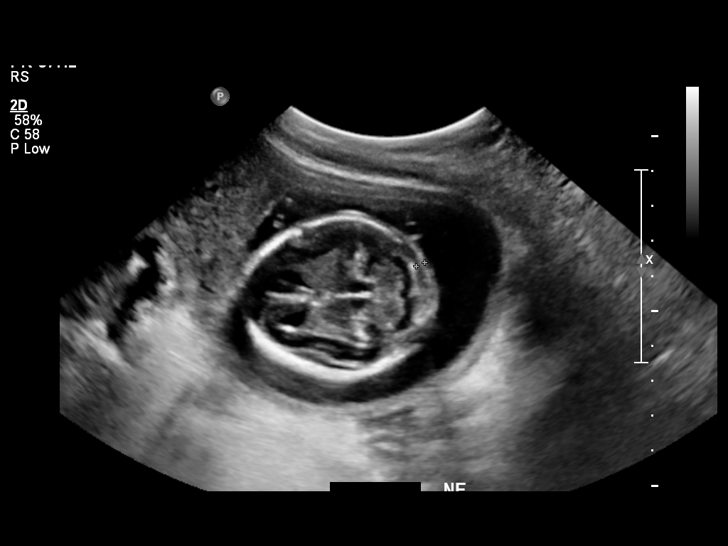
[im 52/55]
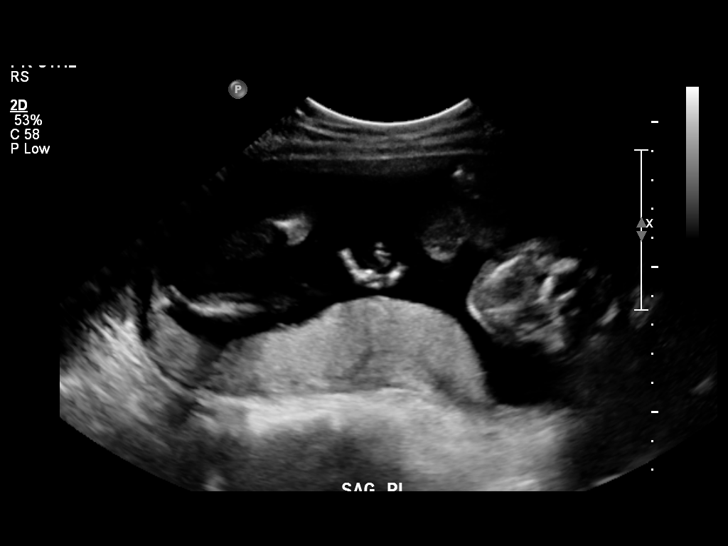

[Series 1: us ob detail +14 wk · 0.27mm/px · 1 of 176 frames shown (2 of 2)]
[frame 89/176]
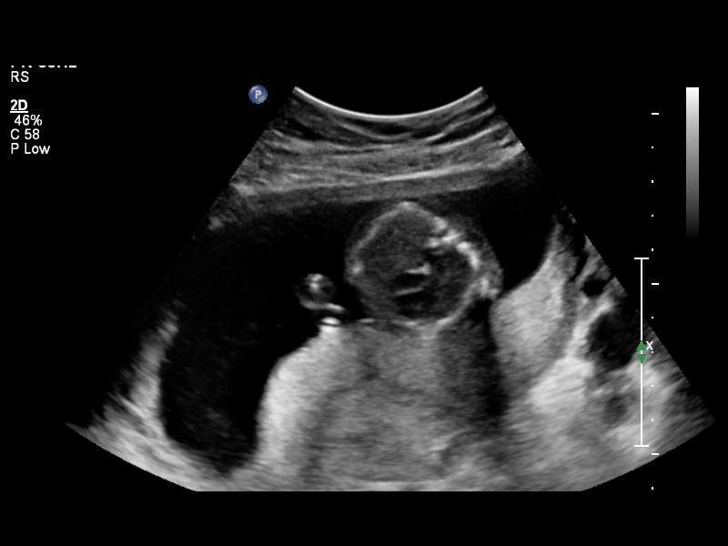

[14 of 28 positions shown; findings below may reference images not displayed]

IMPRESSION: See AS Obstetric US report.

## 2009-10-15 ENCOUNTER — Ambulatory Visit: Payer: Self-pay | Admitting: Family Medicine

## 2009-10-28 ENCOUNTER — Ambulatory Visit: Payer: Self-pay | Admitting: Family Medicine

## 2009-10-28 ENCOUNTER — Encounter: Payer: Self-pay | Admitting: Family Medicine

## 2009-10-28 LAB — CONVERTED CEMR LAB
BUN: 7 mg/dL (ref 6–23)
Chloride: 105 meq/L (ref 96–112)
Creatinine, Ser: 0.37 mg/dL — ABNORMAL LOW (ref 0.40–1.20)
HCT: 28.5 % — ABNORMAL LOW (ref 36.0–46.0)
Hemoglobin: 9.6 g/dL — ABNORMAL LOW (ref 12.0–15.0)
MCHC: 33.7 g/dL (ref 30.0–36.0)
MCV: 82.1 fL (ref 78.0–100.0)
Platelets: 212 10*3/uL (ref 150–400)
Potassium: 3.9 meq/L (ref 3.5–5.3)
Protein, U semiquant: NEGATIVE
RBC: 3.47 M/uL — ABNORMAL LOW (ref 3.87–5.11)
Sodium: 137 meq/L (ref 135–145)
Total Bilirubin: 0.2 mg/dL — ABNORMAL LOW (ref 0.3–1.2)
Total Protein: 5.6 g/dL — ABNORMAL LOW (ref 6.0–8.3)

## 2009-10-30 ENCOUNTER — Encounter: Payer: Self-pay | Admitting: Family Medicine

## 2009-11-14 ENCOUNTER — Ambulatory Visit: Payer: Self-pay | Admitting: Family Medicine

## 2009-11-14 DIAGNOSIS — L259 Unspecified contact dermatitis, unspecified cause: Secondary | ICD-10-CM | POA: Insufficient documentation

## 2009-11-14 DIAGNOSIS — G5600 Carpal tunnel syndrome, unspecified upper limb: Secondary | ICD-10-CM | POA: Insufficient documentation

## 2009-11-16 ENCOUNTER — Inpatient Hospital Stay (HOSPITAL_COMMUNITY): Admission: AD | Admit: 2009-11-16 | Discharge: 2009-11-17 | Payer: Self-pay | Admitting: Obstetrics and Gynecology

## 2009-11-16 ENCOUNTER — Ambulatory Visit: Payer: Self-pay | Admitting: Advanced Practice Midwife

## 2009-11-18 ENCOUNTER — Encounter: Payer: Self-pay | Admitting: Family Medicine

## 2009-11-20 ENCOUNTER — Encounter: Payer: Self-pay | Admitting: Family Medicine

## 2009-11-20 ENCOUNTER — Ambulatory Visit: Payer: Self-pay | Admitting: Family Medicine

## 2009-12-05 ENCOUNTER — Ambulatory Visit: Payer: Self-pay | Admitting: Family Medicine

## 2009-12-05 LAB — CONVERTED CEMR LAB: Hemoglobin: 10.1 g/dL

## 2009-12-06 ENCOUNTER — Telehealth: Payer: Self-pay | Admitting: Family Medicine

## 2009-12-06 ENCOUNTER — Encounter: Payer: Self-pay | Admitting: Family Medicine

## 2009-12-06 LAB — CONVERTED CEMR LAB: HIV: NONREACTIVE

## 2009-12-11 ENCOUNTER — Encounter: Payer: Self-pay | Admitting: Family Medicine

## 2009-12-11 ENCOUNTER — Ambulatory Visit: Payer: Self-pay | Admitting: Family Medicine

## 2009-12-11 LAB — CONVERTED CEMR LAB
ALT: 11 units/L (ref 0–35)
Alkaline Phosphatase: 82 units/L (ref 39–117)
BUN: 4 mg/dL — ABNORMAL LOW (ref 6–23)
Chloride: 107 meq/L (ref 96–112)
Creatinine, Ser: 0.41 mg/dL (ref 0.40–1.20)
Glucose, Urine, Semiquant: NEGATIVE
Hemoglobin: 9.8 g/dL — ABNORMAL LOW (ref 12.0–15.0)
MCHC: 33.3 g/dL (ref 30.0–36.0)
Potassium: 3.9 meq/L (ref 3.5–5.3)
Protein, U semiquant: NEGATIVE
Total Protein: 5.6 g/dL — ABNORMAL LOW (ref 6.0–8.3)

## 2009-12-13 ENCOUNTER — Encounter: Payer: Self-pay | Admitting: Family Medicine

## 2009-12-13 LAB — CONVERTED CEMR LAB: Protein, Ur: 78 mg/24hr (ref 50–100)

## 2009-12-18 ENCOUNTER — Ambulatory Visit: Payer: Self-pay | Admitting: Family Medicine

## 2009-12-24 ENCOUNTER — Ambulatory Visit: Payer: Self-pay | Admitting: Family Medicine

## 2009-12-24 ENCOUNTER — Ambulatory Visit (HOSPITAL_COMMUNITY): Admission: RE | Admit: 2009-12-24 | Discharge: 2009-12-24 | Payer: Self-pay | Admitting: Family Medicine

## 2009-12-24 ENCOUNTER — Encounter: Payer: Self-pay | Admitting: Family Medicine

## 2009-12-24 IMAGING — US US OB FOLLOW-UP
1 series · 14 of 28 positions shown · non-contrast
Comparison: none

[Series 1: us ob follow up · 0.24mm/px · 14 of 37 slices shown]
[im 2/37]
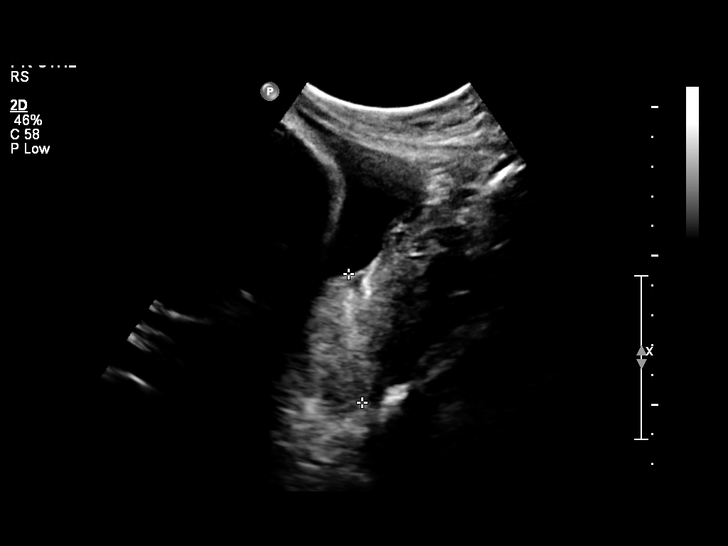
[im 5/37]
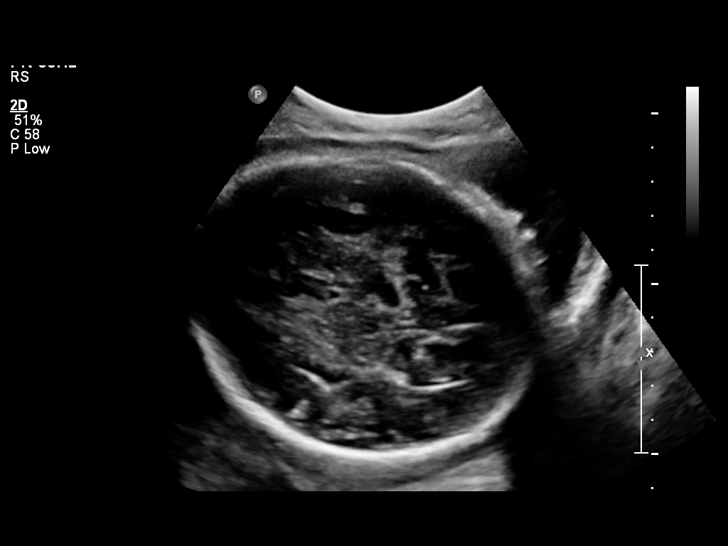
[im 7/37]
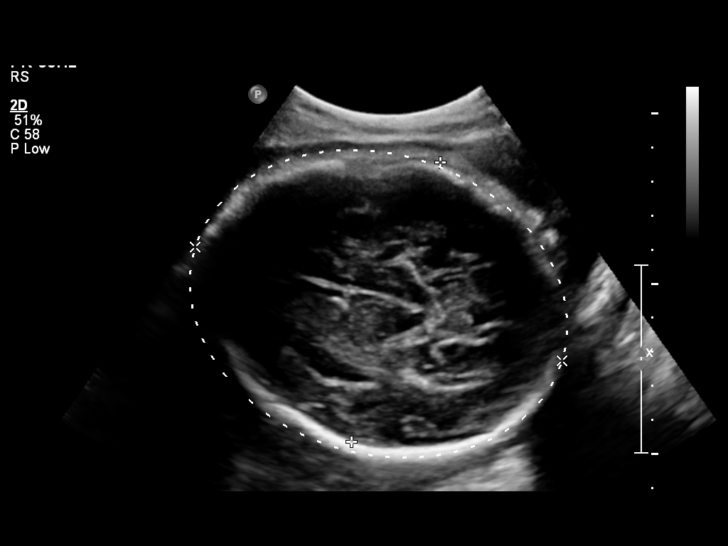
[im 10/37]
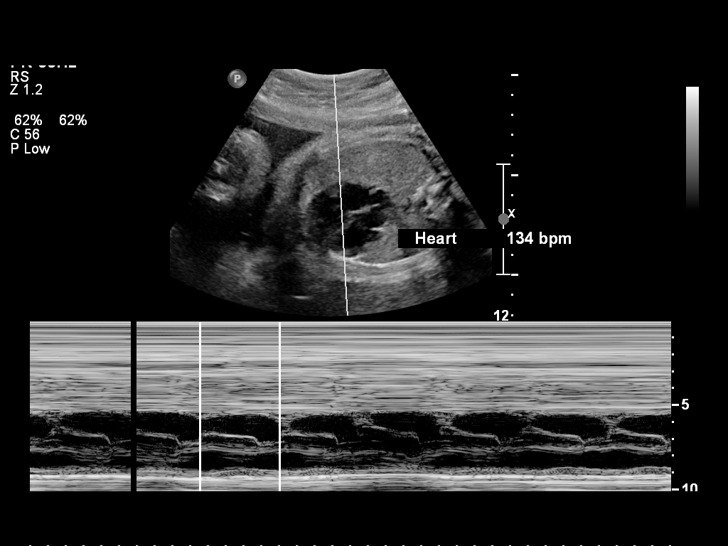
[im 13/37]
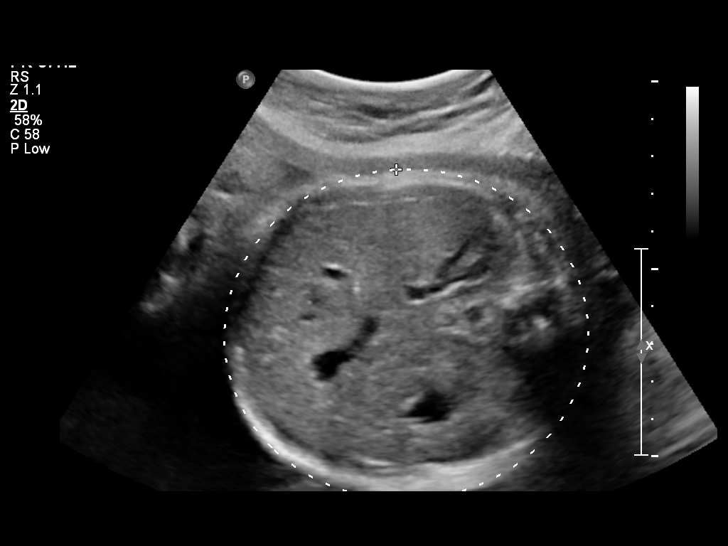
[im 15/37]
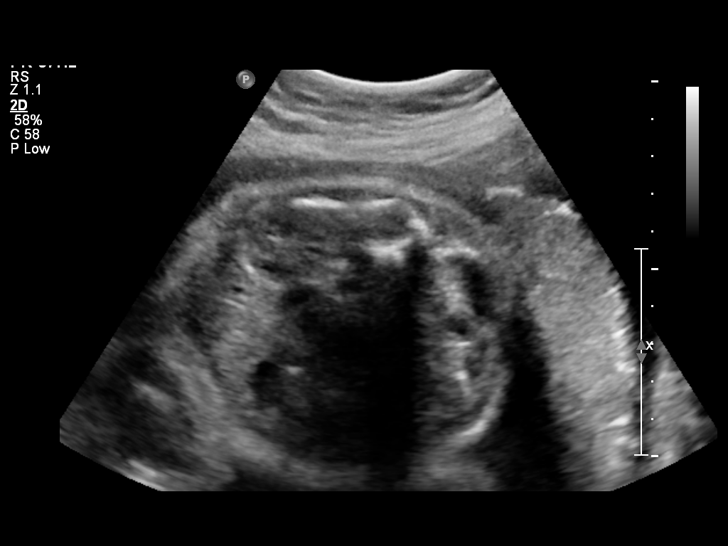
[im 18/37]
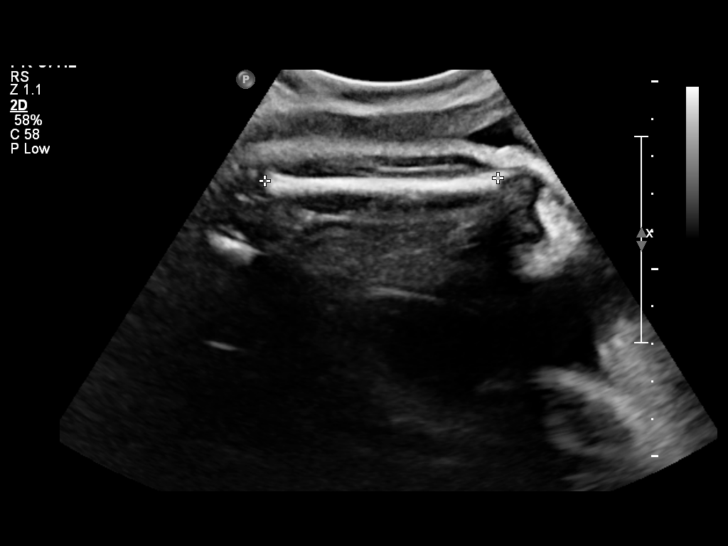
[im 21/37]
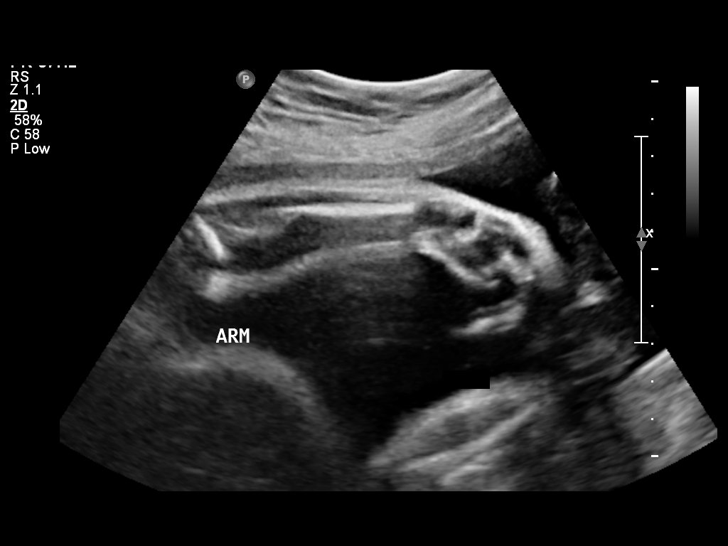
[im 23/37]
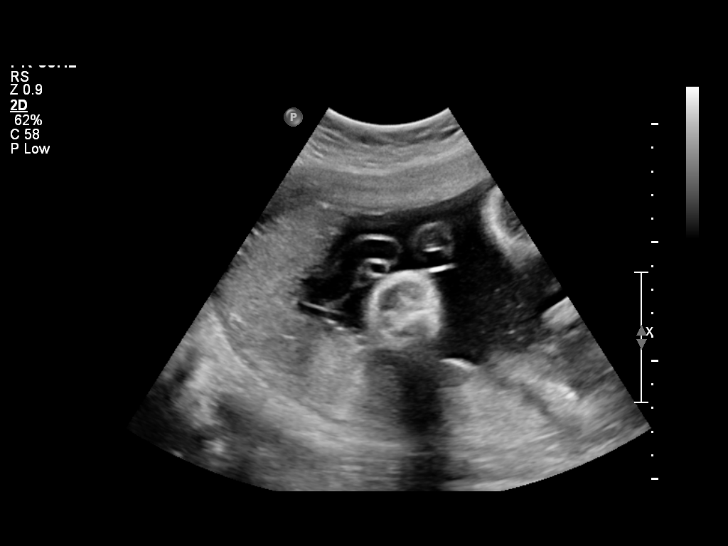
[im 26/37]
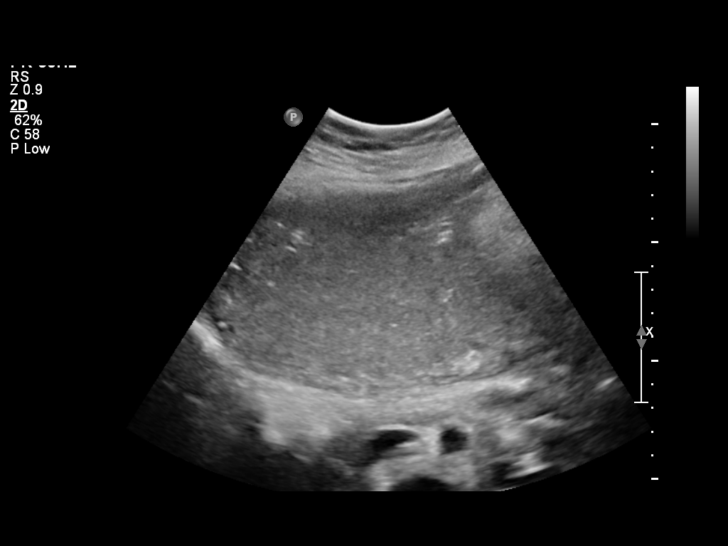
[im 29/37]
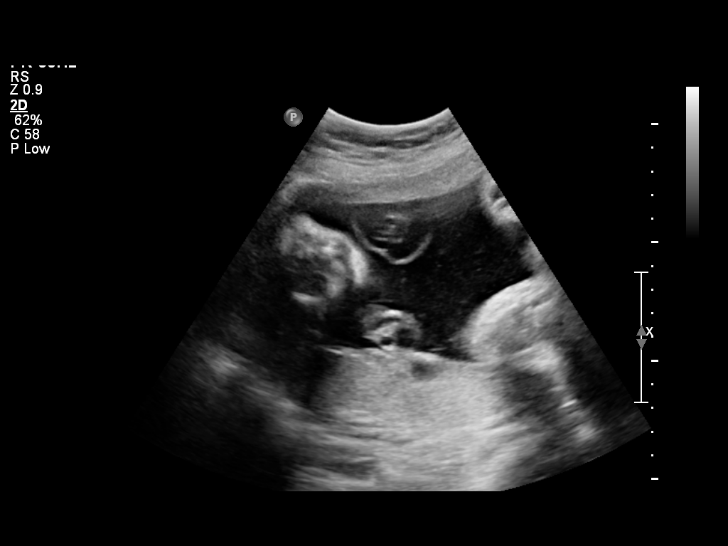
[im 31/37]
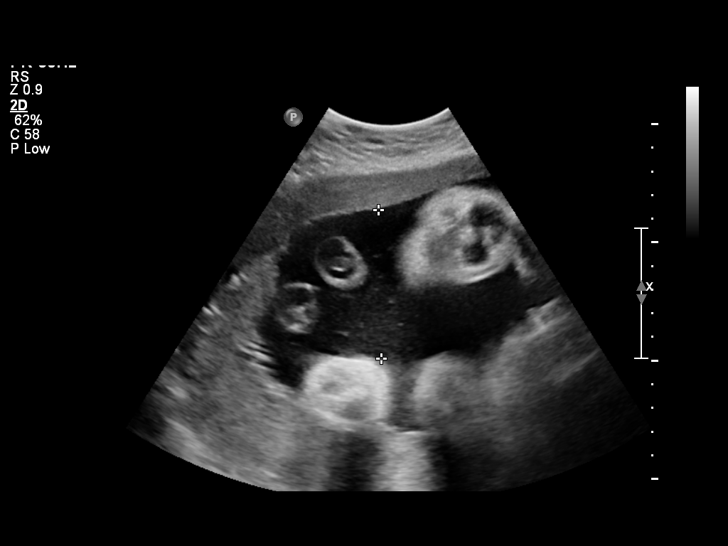
[im 34/37]
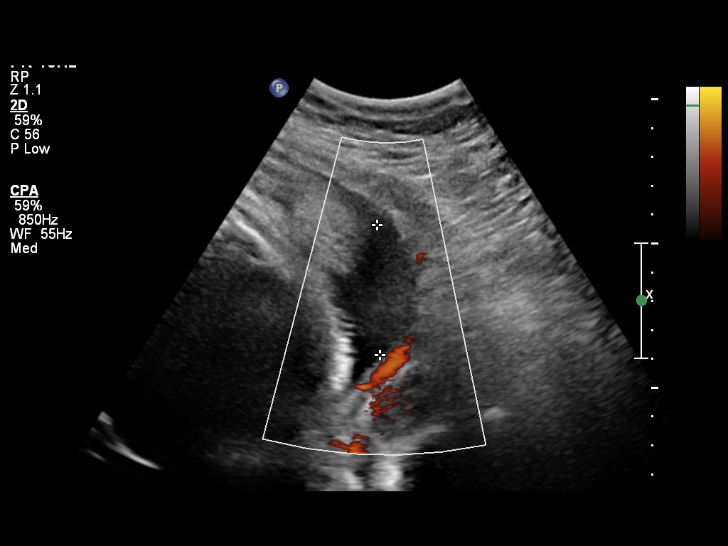
[im 37/37]
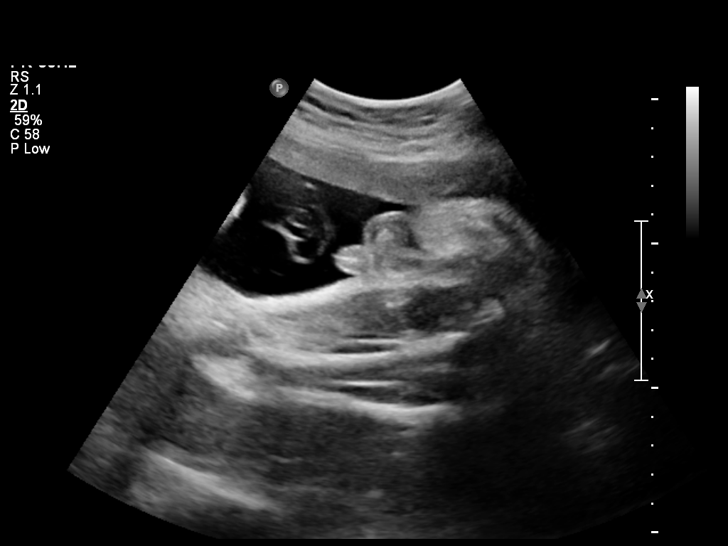

[14 of 28 positions shown; findings below may reference images not displayed]

Canned report from images found in remote index.

Refer to host system for actual result text.

## 2009-12-26 ENCOUNTER — Telehealth: Payer: Self-pay | Admitting: Family Medicine

## 2009-12-30 ENCOUNTER — Ambulatory Visit: Payer: Self-pay | Admitting: Family Medicine

## 2010-01-01 ENCOUNTER — Encounter: Payer: Self-pay | Admitting: Family Medicine

## 2010-01-01 ENCOUNTER — Ambulatory Visit: Payer: Self-pay | Admitting: Family Medicine

## 2010-01-01 DIAGNOSIS — N760 Acute vaginitis: Secondary | ICD-10-CM | POA: Insufficient documentation

## 2010-01-01 LAB — CONVERTED CEMR LAB
Bilirubin Urine: NEGATIVE
Blood in Urine, dipstick: NEGATIVE
Chlamydia, DNA Probe: NEGATIVE
Glucose, Urine, Semiquant: NEGATIVE
HIV: NONREACTIVE
Nitrite: NEGATIVE
Protein, U semiquant: NEGATIVE
pH: 6.5

## 2010-01-02 ENCOUNTER — Encounter: Payer: Self-pay | Admitting: Family Medicine

## 2010-01-08 ENCOUNTER — Ambulatory Visit: Payer: Self-pay | Admitting: Family Medicine

## 2010-01-08 LAB — CONVERTED CEMR LAB: Protein, U semiquant: NEGATIVE

## 2010-01-15 ENCOUNTER — Ambulatory Visit: Payer: Self-pay | Admitting: Family Medicine

## 2010-01-22 ENCOUNTER — Encounter: Payer: Self-pay | Admitting: Family Medicine

## 2010-01-22 ENCOUNTER — Inpatient Hospital Stay (HOSPITAL_COMMUNITY)
Admission: AD | Admit: 2010-01-22 | Discharge: 2010-01-22 | Payer: Self-pay | Source: Home / Self Care | Attending: Obstetrics & Gynecology | Admitting: Obstetrics & Gynecology

## 2010-01-22 ENCOUNTER — Ambulatory Visit: Payer: Self-pay | Admitting: Family Medicine

## 2010-01-22 LAB — CONVERTED CEMR LAB
Glucose, Urine, Semiquant: NEGATIVE
Protein, U semiquant: NEGATIVE

## 2010-01-28 ENCOUNTER — Inpatient Hospital Stay (HOSPITAL_COMMUNITY)
Admission: AD | Admit: 2010-01-28 | Discharge: 2010-01-28 | Payer: Self-pay | Source: Home / Self Care | Attending: Obstetrics & Gynecology | Admitting: Obstetrics & Gynecology

## 2010-01-30 ENCOUNTER — Ambulatory Visit: Payer: Self-pay | Admitting: Family Medicine

## 2010-02-06 ENCOUNTER — Ambulatory Visit: Payer: Self-pay | Admitting: Family Medicine

## 2010-02-06 ENCOUNTER — Encounter: Payer: Self-pay | Admitting: Family Medicine

## 2010-02-06 ENCOUNTER — Inpatient Hospital Stay (HOSPITAL_COMMUNITY)
Admission: AD | Admit: 2010-02-06 | Discharge: 2010-02-07 | Payer: Self-pay | Source: Home / Self Care | Attending: Family Medicine | Admitting: Family Medicine

## 2010-02-06 DIAGNOSIS — Z22330 Carrier of Group B streptococcus: Secondary | ICD-10-CM | POA: Insufficient documentation

## 2010-02-06 LAB — CONVERTED CEMR LAB: Protein, U semiquant: NEGATIVE

## 2010-02-11 ENCOUNTER — Ambulatory Visit: Admit: 2010-02-11 | Payer: Self-pay

## 2010-02-11 ENCOUNTER — Telehealth: Payer: Self-pay | Admitting: Family Medicine

## 2010-02-11 ENCOUNTER — Inpatient Hospital Stay (HOSPITAL_COMMUNITY)
Admission: AD | Admit: 2010-02-11 | Discharge: 2010-02-14 | Payer: Self-pay | Source: Home / Self Care | Attending: Obstetrics & Gynecology | Admitting: Obstetrics & Gynecology

## 2010-02-12 LAB — CBC
HCT: 30.4 % — ABNORMAL LOW (ref 36.0–46.0)
HCT: 28.5 % — ABNORMAL LOW (ref 36.0–46.0)
Hemoglobin: 10 g/dL — ABNORMAL LOW (ref 12.0–15.0)
Hemoglobin: 9.5 g/dL — ABNORMAL LOW (ref 12.0–15.0)
MCH: 25.5 pg — ABNORMAL LOW (ref 26.0–34.0)
MCH: 25.8 pg — ABNORMAL LOW (ref 26.0–34.0)
MCHC: 32.9 g/dL (ref 30.0–36.0)
MCHC: 33.3 g/dL (ref 30.0–36.0)
MCV: 77.6 fL — ABNORMAL LOW (ref 78.0–100.0)
MCV: 77.4 fL — ABNORMAL LOW (ref 78.0–100.0)
Platelets: 192 10*3/uL (ref 150–400)
Platelets: 174 10*3/uL (ref 150–400)
RBC: 3.92 MIL/uL (ref 3.87–5.11)
RBC: 3.68 MIL/uL — ABNORMAL LOW (ref 3.87–5.11)
RDW: 12.6 % (ref 11.5–15.5)
RDW: 12.4 % (ref 11.5–15.5)
WBC: 9.5 10*3/uL (ref 4.0–10.5)
WBC: 8 10*3/uL (ref 4.0–10.5)

## 2010-02-12 LAB — ANTIBODY SCREEN: Antibody Screen: NEGATIVE

## 2010-02-13 LAB — CBC
HCT: 25.4 % — ABNORMAL LOW (ref 36.0–46.0)
Hemoglobin: 8.5 g/dL — ABNORMAL LOW (ref 12.0–15.0)
MCH: 25.8 pg — ABNORMAL LOW (ref 26.0–34.0)
MCHC: 33.5 g/dL (ref 30.0–36.0)
MCV: 77 fL — ABNORMAL LOW (ref 78.0–100.0)
Platelets: 181 10*3/uL (ref 150–400)
RBC: 3.3 MIL/uL — ABNORMAL LOW (ref 3.87–5.11)
RDW: 12.6 % (ref 11.5–15.5)
WBC: 12.3 10*3/uL — ABNORMAL HIGH (ref 4.0–10.5)

## 2010-02-13 LAB — MRSA PCR SCREENING: MRSA by PCR: NEGATIVE

## 2010-02-15 ENCOUNTER — Telehealth: Payer: Self-pay | Admitting: Family Medicine

## 2010-02-16 ENCOUNTER — Inpatient Hospital Stay (HOSPITAL_COMMUNITY)
Admission: AD | Admit: 2010-02-16 | Discharge: 2010-02-18 | Payer: Self-pay | Source: Home / Self Care | Attending: Obstetrics & Gynecology | Admitting: Obstetrics & Gynecology

## 2010-02-16 IMAGING — CR DG CHEST 1V PORT
1 series · 1 of 1 positions shown · non-contrast
Comparison: [DATE]

CLINICAL DATA: Shortness of breath.  Hypertension.

PORTABLE CHEST - 1 VIEW

[view not recorded]
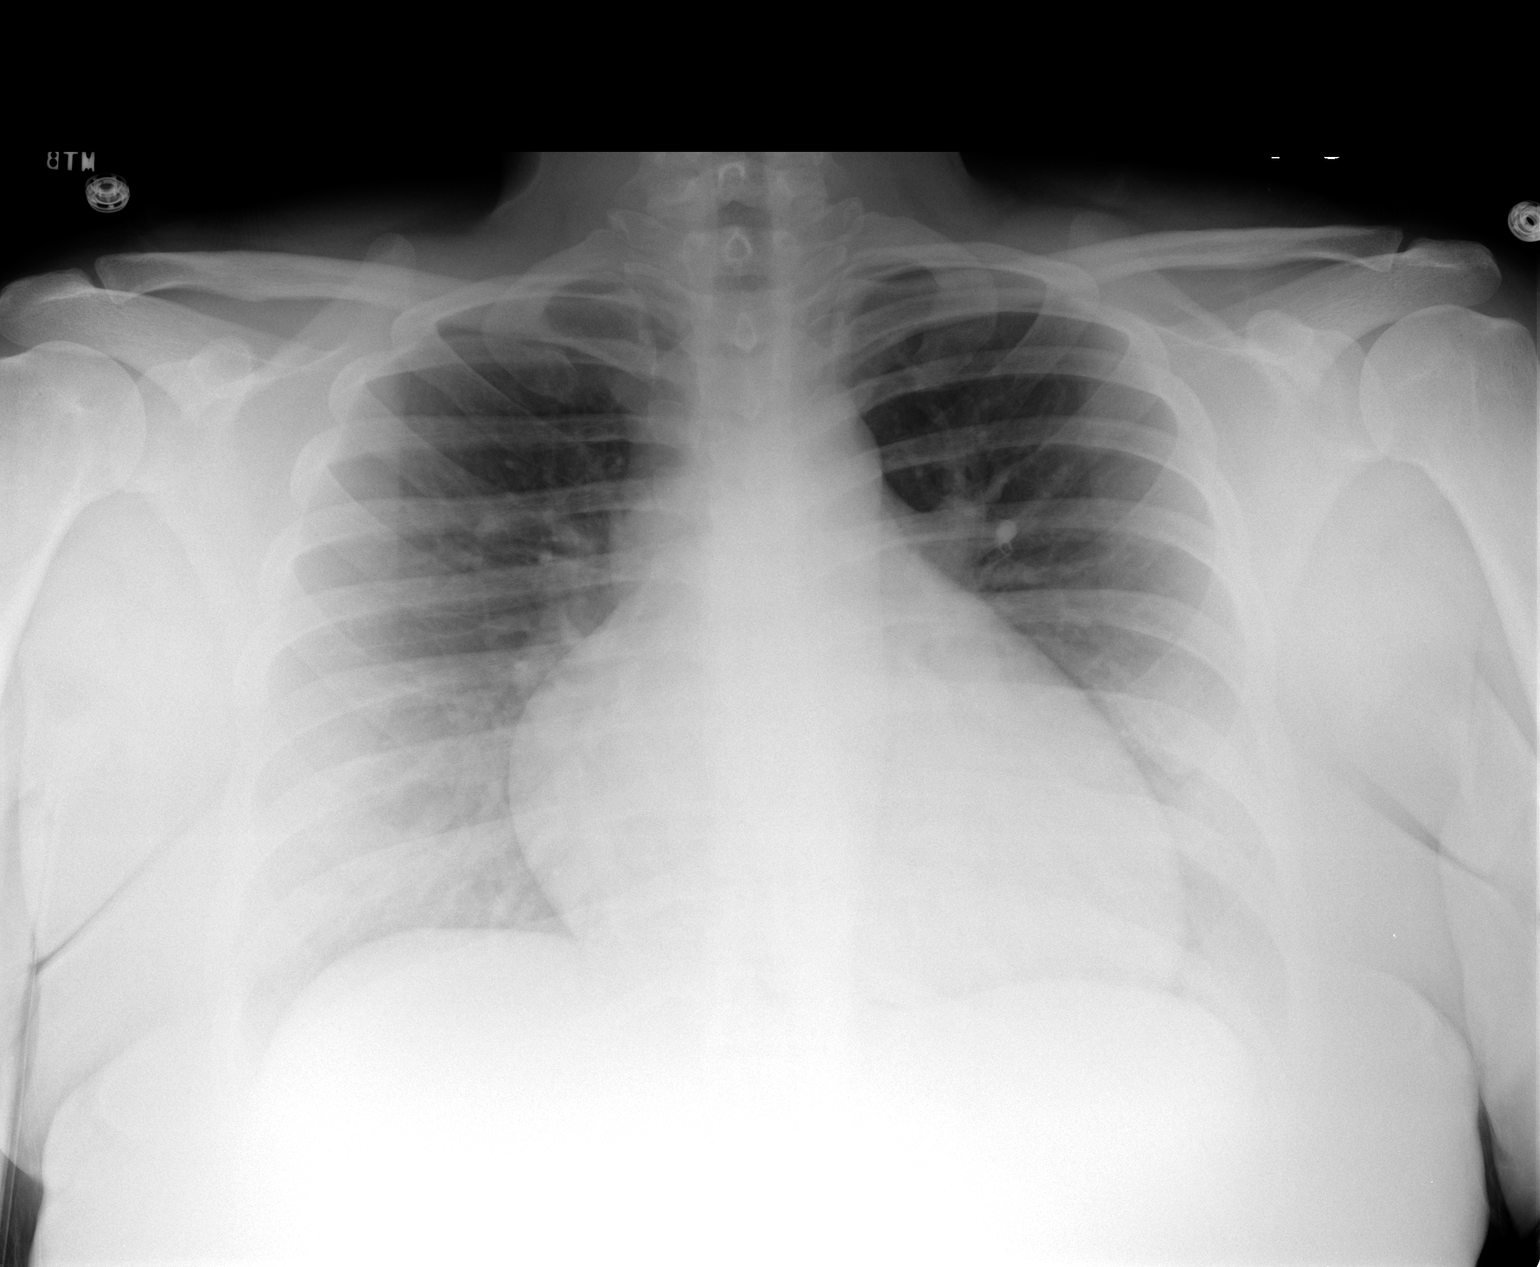

[1 of 1 positions shown; findings below may reference images not displayed]

FINDINGS: Heart size appears slightly prominent even considering
the portable technique.

The vascularity is normal and the lungs are clear.  No osseous
abnormality.
IMPRESSION: Possible mild cardiomegaly.

## 2010-02-16 IMAGING — CT CT ANGIO CHEST
1 series · 20 of 34 positions shown · IV contrast (agent unspecified)
Comparison: None

CLINICAL DATA: Shortness of breath, postpartum

CT ANGIOGRAPHY CHEST WITH CONTRAST
TECHNIQUE: Multidetector CT imaging of the chest was performed
using the standard protocol during bolus administration of
intravenous contrast.  Multiplanar CT image reconstructions
including MIPs were obtained to evaluate the vascular anatomy.
Contrast:  100 ml [7G] IV

[Series 5: pe chest · axial · 0.59mm/px · z∈[+85,+291]mm · 20 of 113 slices shown]
[im 5/113  lung]
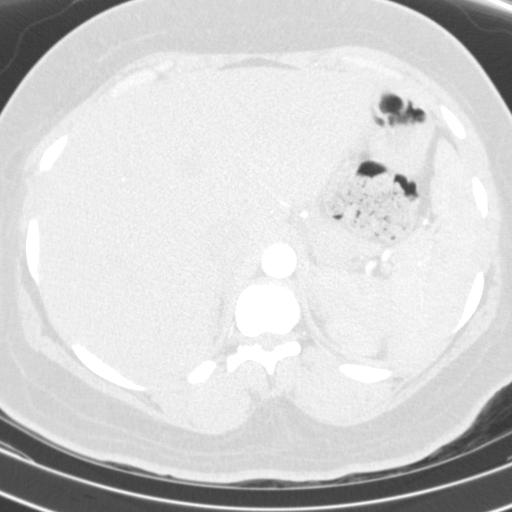
[im 13/113  mediastinal]
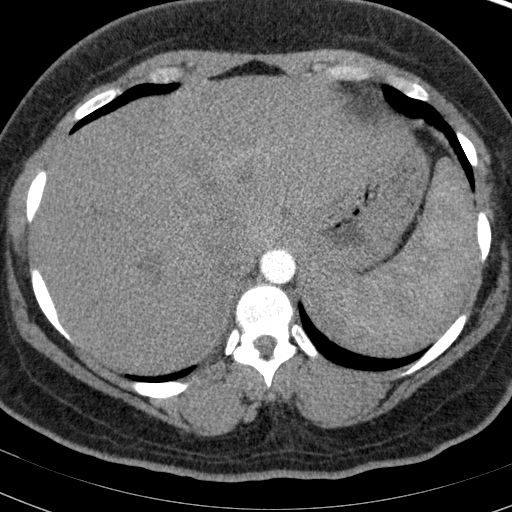
[im 21/113  lung]
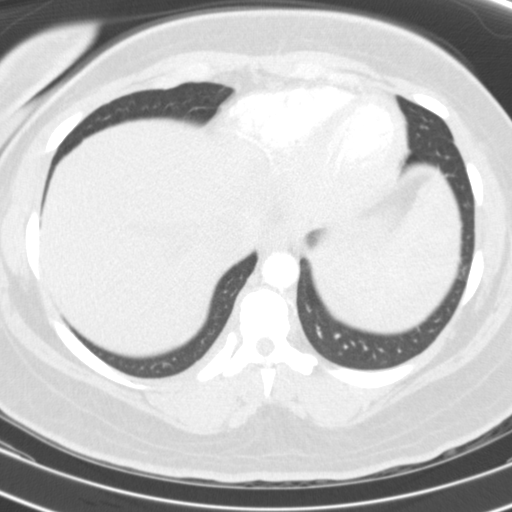
[im 23/113  mediastinal]
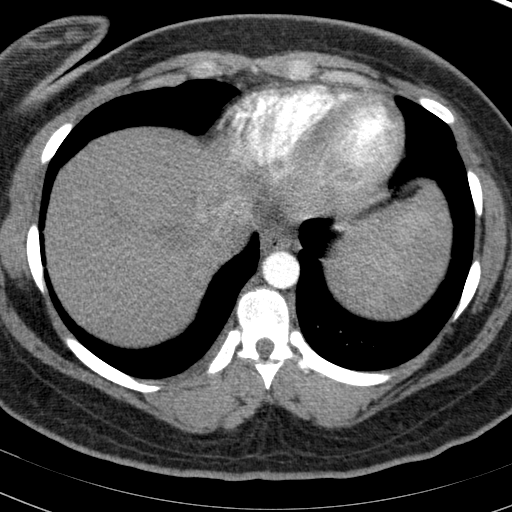
[im 30/113  lung]
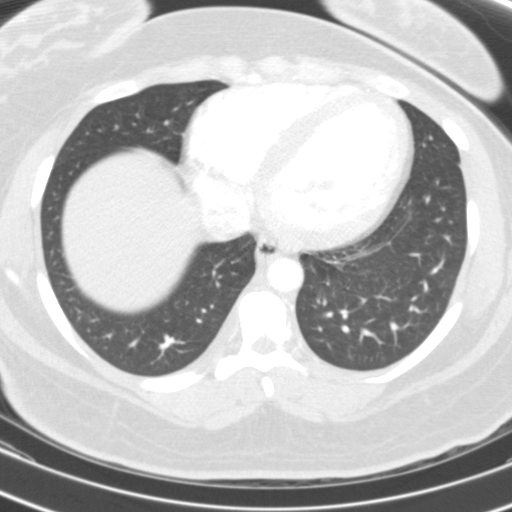
[im 34/113  mediastinal]
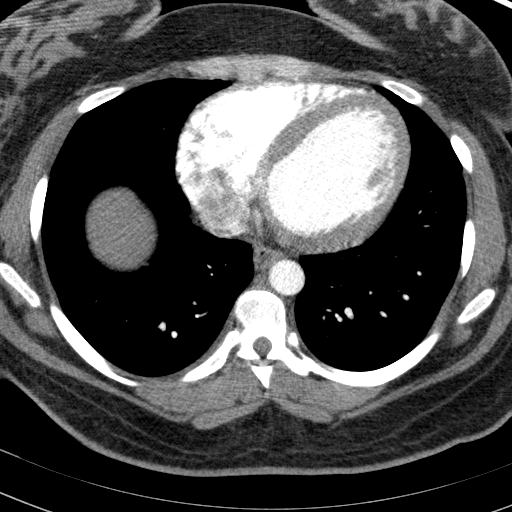
[im 42/113  lung]
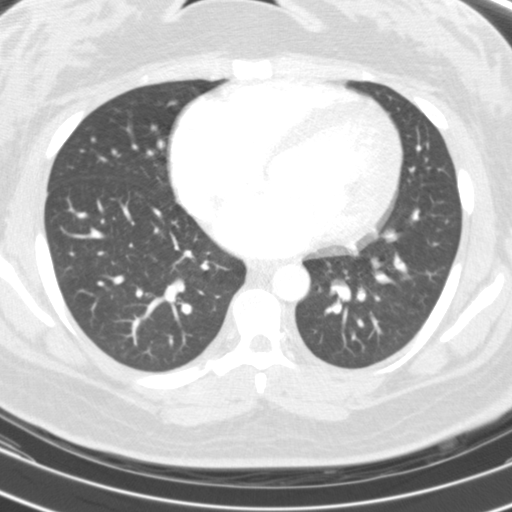
[im 46/113  mediastinal]
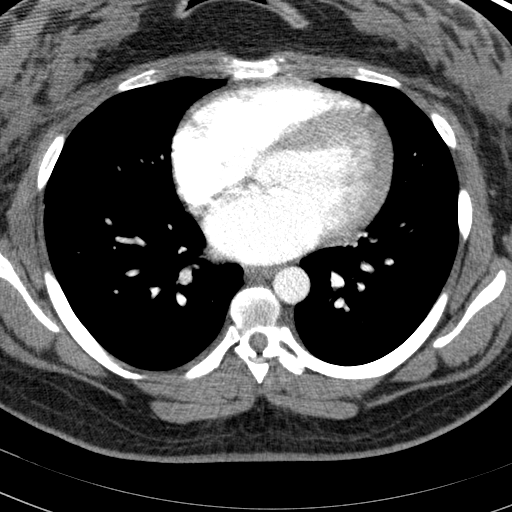
[im 50/113  lung]
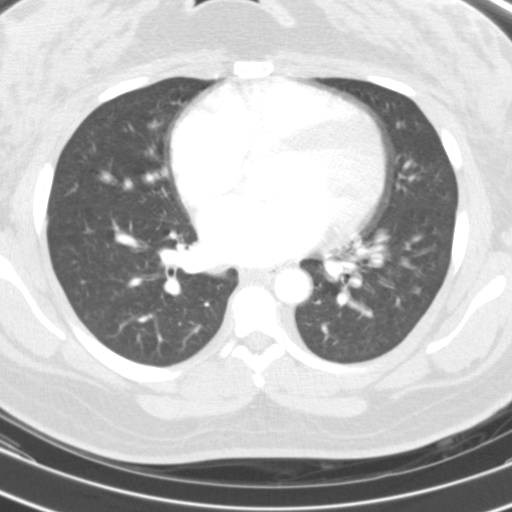
[im 54/113  mediastinal]
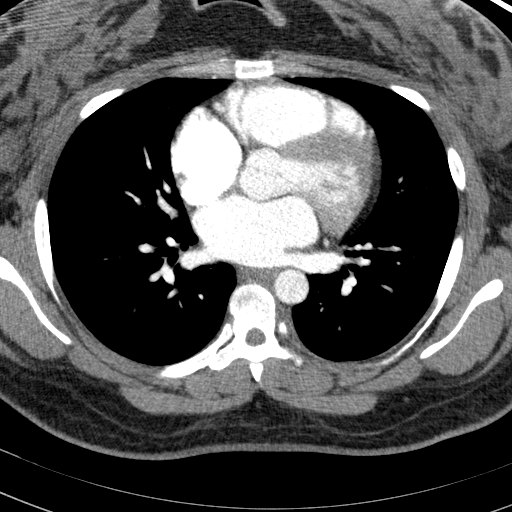
[im 59/113  lung]
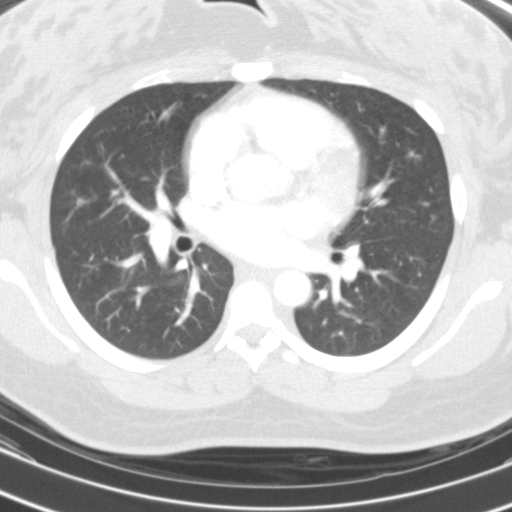
[im 63/113  mediastinal]
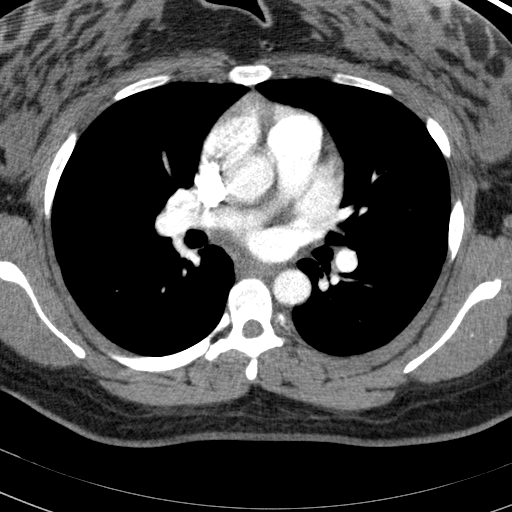
[im 68/113  lung]
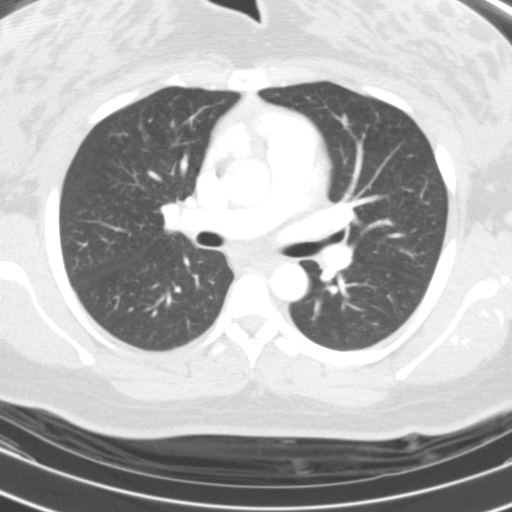
[im 71/113  mediastinal]
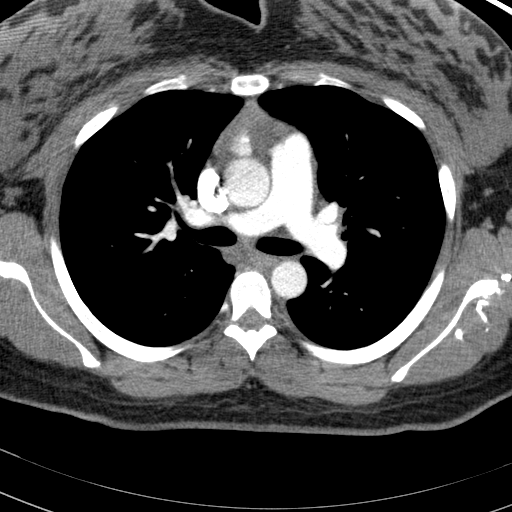
[im 79/113  lung]
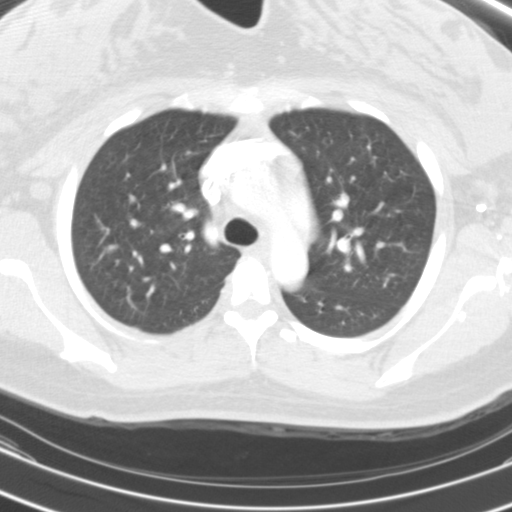
[im 83/113  mediastinal]
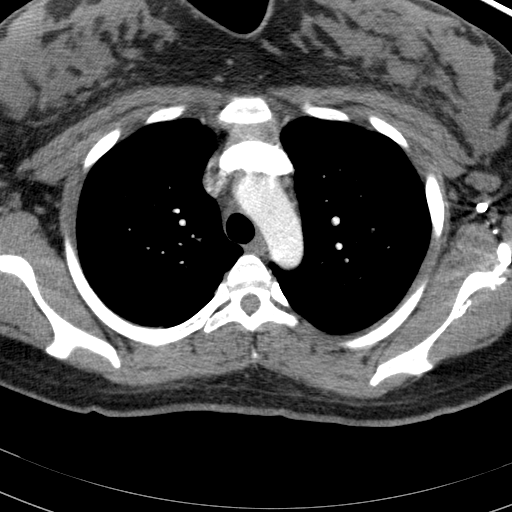
[im 90/113  lung]
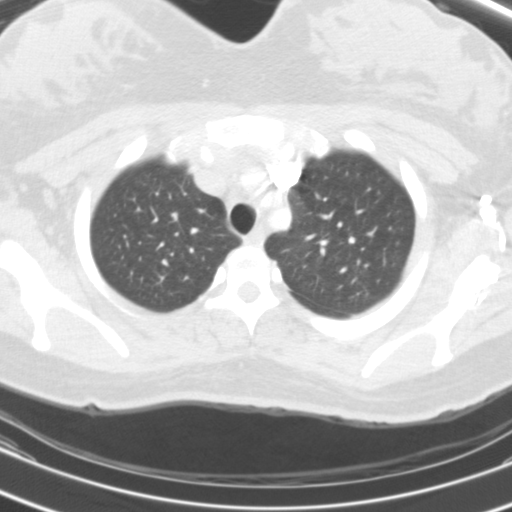
[im 96/113  mediastinal]
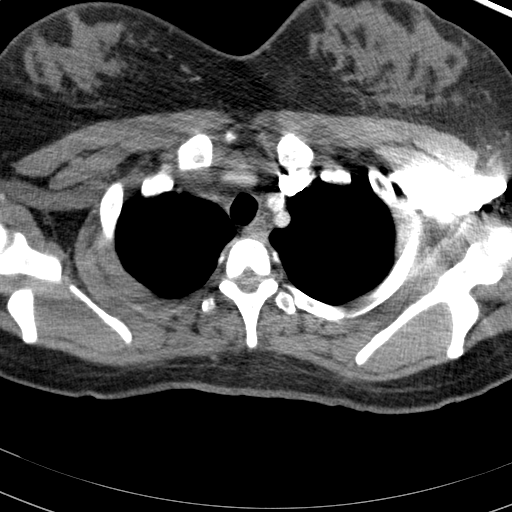
[im 100/113  lung]
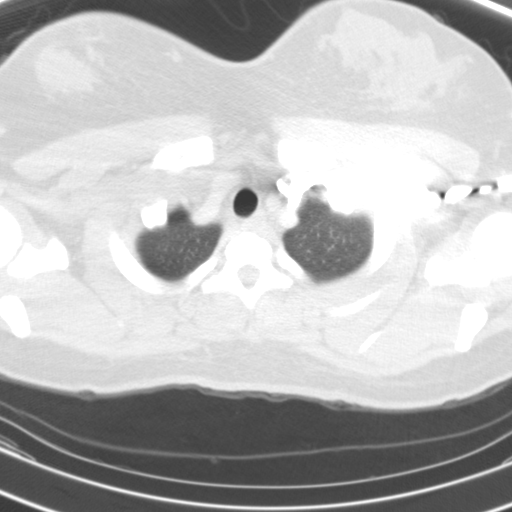
[im 108/113  mediastinal]
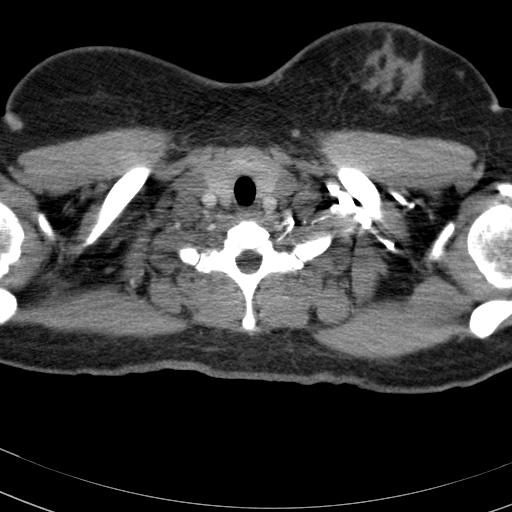

[20 of 34 positions shown; findings below may reference images not displayed]

FINDINGS: There is fairly good contrast opacification of pulmonary
artery branches.  No discrete filling defects to suggest acute PE.
Patient breathing during the acquisition degrades some of the
images.  There is fairly good contrast opacification of the
thoracic aorta with no suggestion of dissection, aneurysm, or
stenosis.

No pleural or pericardial effusion.  No hilar or mediastinal
adenopathy.  Residual thymic tissue.  Visualized portions of upper
abdomen unremarkable.

Lungs are clear.  Regional bones unremarkable.

Review of the MIP images confirms the above findings.
IMPRESSION: 1.  Negative for acute PE or thoracic aortic dissection.

## 2010-02-17 ENCOUNTER — Encounter: Payer: Self-pay | Admitting: Obstetrics & Gynecology

## 2010-02-17 ENCOUNTER — Ambulatory Visit: Admit: 2010-02-17 | Payer: Self-pay

## 2010-02-24 LAB — COMPREHENSIVE METABOLIC PANEL
ALT: 18 U/L (ref 0–35)
ALT: 19 U/L (ref 0–35)
AST: 21 U/L (ref 0–37)
AST: 19 U/L (ref 0–37)
Albumin: 2.5 g/dL — ABNORMAL LOW (ref 3.5–5.2)
Albumin: 2.4 g/dL — ABNORMAL LOW (ref 3.5–5.2)
Alkaline Phosphatase: 139 U/L — ABNORMAL HIGH (ref 39–117)
Alkaline Phosphatase: 134 U/L — ABNORMAL HIGH (ref 39–117)
BUN: 6 mg/dL (ref 6–23)
BUN: 8 mg/dL (ref 6–23)
CO2: 26 mEq/L (ref 19–32)
CO2: 25 mEq/L (ref 19–32)
Calcium: 8.5 mg/dL (ref 8.4–10.5)
Calcium: 8.4 mg/dL (ref 8.4–10.5)
Chloride: 106 mEq/L (ref 96–112)
Chloride: 104 mEq/L (ref 96–112)
Creatinine, Ser: 0.53 mg/dL (ref 0.4–1.2)
Creatinine, Ser: 0.62 mg/dL (ref 0.4–1.2)
GFR calc Af Amer: >60 mL/min (ref >60)
GFR calc Af Amer: >60 mL/min (ref >60)
GFR calc non Af Amer: >60 mL/min (ref >60)
GFR calc non Af Amer: >60 mL/min (ref >60)
Glucose, Bld: 74 mg/dL (ref 70–99)
Glucose, Bld: 86 mg/dL (ref 70–99)
Potassium: 4.1 mEq/L (ref 3.5–5.1)
Potassium: 3.8 mEq/L (ref 3.5–5.1)
Sodium: 137 mEq/L (ref 135–145)
Sodium: 137 mEq/L (ref 135–145)
Total Bilirubin: 0.5 mg/dL (ref 0.3–1.2)
Total Bilirubin: 0.2 mg/dL — ABNORMAL LOW (ref 0.3–1.2)
Total Protein: 5.3 g/dL — ABNORMAL LOW (ref 6.0–8.3)
Total Protein: 5.1 g/dL — ABNORMAL LOW (ref 6.0–8.3)

## 2010-02-24 LAB — URINALYSIS, ROUTINE W REFLEX MICROSCOPIC
Bilirubin Urine: NEGATIVE
Bilirubin Urine: NEGATIVE
Ketones, ur: NEGATIVE mg/dL
Ketones, ur: NEGATIVE mg/dL
Leukocytes, UA: NEGATIVE
Nitrite: NEGATIVE
Nitrite: NEGATIVE
Protein, ur: NEGATIVE mg/dL
Protein, ur: NEGATIVE mg/dL
Specific Gravity, Urine: 1.015 (ref 1.005–1.030)
Specific Gravity, Urine: 1.01 (ref 1.005–1.030)
Urine Glucose, Fasting: NEGATIVE mg/dL
Urine Glucose, Fasting: NEGATIVE mg/dL
Urobilinogen, UA: 0.2 mg/dL (ref 0.0–1.0)
Urobilinogen, UA: 0.2 mg/dL (ref 0.0–1.0)
pH: 7 (ref 5.0–8.0)
pH: 7 (ref 5.0–8.0)

## 2010-02-24 LAB — URINE MICROSCOPIC-ADD ON

## 2010-02-24 LAB — SAMPLE TO BLOOD BANK

## 2010-02-24 LAB — TSH: TSH: 1.729 u[IU]/mL (ref 0.350–4.500)

## 2010-02-24 LAB — CBC
HCT: 23.7 % — ABNORMAL LOW (ref 36.0–46.0)
HCT: 25.7 % — ABNORMAL LOW (ref 36.0–46.0)
Hemoglobin: 7.9 g/dL — ABNORMAL LOW (ref 12.0–15.0)
Hemoglobin: 8.5 g/dL — ABNORMAL LOW (ref 12.0–15.0)
MCH: 26.1 pg (ref 26.0–34.0)
MCH: 25.5 pg — ABNORMAL LOW (ref 26.0–34.0)
MCHC: 33.3 g/dL (ref 30.0–36.0)
MCHC: 33.1 g/dL (ref 30.0–36.0)
MCV: 78.2 fL (ref 78.0–100.0)
MCV: 77.2 fL — ABNORMAL LOW (ref 78.0–100.0)
Platelets: 231 10*3/uL (ref 150–400)
Platelets: 248 10*3/uL (ref 150–400)
RBC: 3.03 MIL/uL — ABNORMAL LOW (ref 3.87–5.11)
RBC: 3.33 MIL/uL — ABNORMAL LOW (ref 3.87–5.11)
RDW: 12.6 % (ref 11.5–15.5)
RDW: 12.8 % (ref 11.5–15.5)
WBC: 7.8 10*3/uL (ref 4.0–10.5)
WBC: 7.7 10*3/uL (ref 4.0–10.5)

## 2010-02-24 LAB — BRAIN NATRIURETIC PEPTIDE
Pro B Natriuretic peptide (BNP): 434 pg/mL — ABNORMAL HIGH (ref 0.0–100.0)
Pro B Natriuretic peptide (BNP): 128 pg/mL — ABNORMAL HIGH (ref 0.0–100.0)

## 2010-02-24 LAB — CARDIAC PANEL(CRET KIN+CKTOT+MB+TROPI)
CK, MB: 1.1 ng/mL (ref 0.3–4.0)
Relative Index: INVALID (ref 0.0–2.5)
Total CK: 62 U/L (ref 7–177)
Troponin I: 0.01 ng/mL (ref 0.00–0.06)

## 2010-03-05 ENCOUNTER — Ambulatory Visit: Admit: 2010-03-05 | Payer: Self-pay | Admitting: Obstetrics and Gynecology

## 2010-03-13 NOTE — Assessment & Plan Note (Signed)
Summary: OB f/up 32.6 weeks   Vital Signs:  Patient profile:   25 year old female Weight:      224.5 pounds BP sitting:   114 / 76  Vitals Entered By: Arlyss Repress CMA, (December 30, 2009 1:42 PM)  Primary Care Provider:  Doree Albee MD   History of Present Illness: pt states that her only complaint today is discomfort low in right lower abd  that occurs sometimes with movement and sometimes when she lays down on bed.  no n/v/d.   Pt also endorses occasional "black spots" in field of vision off and on throughout pregnancy.  Usually occur when she is sitting or watching tv.  no dizziness. no double vision.  No loss of vision. no blurry vision.  pt went to dental appt- she states that they told her she needs to have multiple teeth extracted but that they want to wait until after she has delivered.  Are treating with antibiotics for now. -- amoxicillin.   Habits & Providers  Alcohol-Tobacco-Diet     Cigarette Packs/Day: n/a  Current Medications (verified): 1)  Prenatal Vitamins 0.8 Mg Tabs (Prenatal Multivit-Min-Fe-Fa) .Marland Kitchen.. 1 Tab By Mouth Daily; Dispense Generic 2)  Amoxicillin 500 Mg Caps (Amoxicillin) .Marland Kitchen.. 1 By Mouth Three Times A Day X 10 Days 3)  Fluoxetine Hcl 20 Mg Caps (Fluoxetine Hcl) .... Take One Every Day For Your Mood. 4)  Tylenol With Codeine #3 300-30 Mg Tabs (Acetaminophen-Codeine) .... Take 1-2 Every 8 Hours For Pain 5)  Zofran 4 Mg Tabs (Ondansetron Hcl) .Marland Kitchen.. 1 Tab Every 6-8 Hours As Needed For Nausea  Allergies (verified): 1)  Butorphanol Tartrate (Butorphanol Tartrate)  Review of Systems       as per hpi and ob flowsheet.   Physical Exam  General:  VSS Well-developed,well-nourished,in no acute distress; alert,appropriate and cooperative throughout examination Abdomen:  gravid, no abd pain on palpation.  some discomfort with movement of right leg in the area of the round ligament.  Neurologic:  No cranial nerve deficits noted. Station and gait are normal.   Sensory, motor and coordinative functions appear intact. CN II- XII intact. alert & oriented X3.   Skin:  Intact without suspicious lesions or rashes Psych:  Cognition and judgment appear intact. Alert and cooperative with normal attention span and concentration. No apparent delusions, illusions, hallucinations   Impression & Recommendations:  Problem # 1:  PREGNANCY (ICD-V22.2) Pt is a G2P1001 (previous birth NSVD) with a EDC of 02/18/10 by u/s at 7.2 weeks presents for today's appt at 32.6 weeks.  important labs: Hep B- neg, RPR- NR, Rubella-immune, A+, antibody negative, Sickle cell screen- positive, HIV- NR  today's visit: pt asked to come back for early f/up to follow elevated BP.  Today's bp improved at 114/76.  no le edema on physical. exam.  Some right lower quad discomort consistent with round ligament pain.  +headache approx 3-4 x per week in the morning. reports occasional black spots in field of vision (unsure of cause of this symptom).  EFW 2121 grams- Dr. Alvester Morin discussed pt case with [facility] at last visit (see last note).  Will continue to follow bp and physical exam.  pt given red flags for return.   discussed this pt case with Dr. Mauricio Po- decided to have pt return in 1 week for regular scheduled f/up appt.    Orders: Other OB visit- [facility] ([facility])  Problem # 2:  DEPRESSION (ICD-311) pt states that she has been trying to take  the fluoxetine as directed.  Encouraged pt to continue to take as directed.    Her updated medication list for this problem includes:    Fluoxetine Hcl 20 Mg Caps (Fluoxetine hcl) .Marland Kitchen... Take one every day for your mood.  Problem # 3:  BROKEN TOOTH, INFECTED (ICD-873.73) per dentist- pt to follow up as arranged. continue amoxicillin as directed.   Complete Medication List: 1)  Prenatal Vitamins 0.8 Mg Tabs (Prenatal multivit-min-fe-fa) .Marland Kitchen.. 1 tab by mouth daily; dispense generic 2)  Amoxicillin 500 Mg Caps (Amoxicillin) .Marland Kitchen.. 1 by mouth three  times a day x 10 days 3)  Fluoxetine Hcl 20 Mg Caps (Fluoxetine hcl) .... Take one every day for your mood. 4)  Tylenol With Codeine #3 300-30 Mg Tabs (Acetaminophen-codeine) .... Take 1-2 every 8 hours for pain 5)  Zofran 4 Mg Tabs (Ondansetron hcl) .Marland Kitchen.. 1 tab every 6-8 hours as needed for nausea  Patient Instructions: 1)  return in 1 week for your next ob visit.     Orders Added: 1)  Other OB visit- [facility] [[facility]]     OB Initial Intake Information    Positive HCG by: At [facility]    Race: Black    Marital status: Single    Occupation: homemaker    Education (last grade completed): 12th grade     Number of children at home: 1  FOB Information    Husband/Father of baby: Merilyn Baba     FOB occupation General Labor     Phone: [phone]  Menstrual History    LMP (date): 04/17/2009    LMP - Character: light    Menarche: 12 years    Menses interval: 25 days    Menstrual flow 5 days    On BCP's at conception: no   Flowsheet View for Follow-up Visit    Estimated weeks of       gestation:     32 6/7    Weight:     224.5    Blood pressure:   114 / 76    Headache:     +    Nausea/vomiting:   nausea    Edema:     0    Vaginal bleeding:   no    Vaginal discharge:   no    Fundal height:      36    FHR:       140s    Fetal activity:     yes    Labor symptoms:   few ctx    Taking prenatal vits?   Y    Smoking:     n/a    Next visit:     1 wk    Resident:     Edmonia James    Preceptor:     Montel [place] for Follow-up Visit    Estimated weeks of       gestation:     32 6/7    Weight:     224.5    Blood pressure:   114 / 76    Hx headache?     +    Nausea/vomiting?   nausea    Edema?     0    Bleeding?     no    Leakage/discharge?   no    Fetal activity:       yes    Labor symptoms?   few ctx    Fundal height:      36  FHR:       140s    Taking Vitamins?   Y    Smoking PPD:   n/a    Next visit:     1 wk    Resident:     Edmonia James    Preceptor:      Montel [place] for Follow-up Visit    Estimated weeks of       gestation:     32 6/7    Weight:     224.5    Blood pressure:   114 / 76    Hx headache?     +    Nausea/vomiting?   nausea    Edema?     0    Bleeding?     no    Leakage/discharge?   no    Fetal activity:       yes    Labor symptoms?   few ctx    Fundal height:      36    FHR:       140s    Taking Vitamins?   Y    Smoking PPD:   n/a    Next visit:     1 wk    Resident:     Edmonia James    Preceptor:     Mauricio Po

## 2010-03-13 NOTE — Assessment & Plan Note (Signed)
Summary: pregnancy test & see doctor,tcb   Vital Signs:  Patient profile:   25 year old female LMP:     04/17/2009 Weight:      205.6 pounds Temp:     98 degrees F oral Pulse rate:   67 / minute BP sitting:   124 / 83  (right arm) Cuff size:   large  Vitals Entered By: Loralee Pacas CMA (Jun 25, 2009 11:37 AM) CC: ? pregnancy Comments unexpected preg, went to [facility] ed on sat and was told that she had a cyst and the u preg was positive LMP (date): 04/17/2009     Enter LMP: 04/17/2009 Last PAP Result NEGATIVE FOR INTRAEPITHELIAL LESIONS OR MALIGNANCY.   Primary Care Provider:  Asher Muir MD  CC:  ? pregnancy.  History of Present Illness: 1.  positive pregnancy test--last period 3/7 (relatively certain).  had positive pregnancy in ER, which is confirmed today.  u/s done showed intrauterine pregnancy at about 5 weeks/5 days on the 14th (which would put her at 6 weeks today.    Symptoms include back pain and some abdominal cramps and lightheadedness, but no nausea or vomitting.    Also, diagnosed with BV at that visit and treated with clinda  Below is copy of ultrasound report from 5/14:    1. Single intrauterine gestational sac containing a yolk sac. Embryo not yet visualized. Estimated gestational age of [redacted] weeks 5 days by mean sac diameter. It is therefore too early to visualize the embryo. 2. Likely hemorrhagic corpus luteum cyst within the left ovary.  Current Medications (verified): 1)  None  Allergies: No Known Drug Allergies  Past History:  Past Medical History: Last updated: 05/09/2008 breast abscesses, elev BP post partum G1P1 sickle cell trait - mild anemia (Hgb 11.5-12)  Review of Systems       The patient complains of weight gain.  The patient denies anorexia and chest pain.   GU:  Denies abnormal vaginal bleeding; amennorhea.  Physical Exam  General:  Well-developed,well-nourished,in no acute distress; alert,appropriate and cooperative  throughout examination Abdomen:  soft, non-tender, no distention, no masses, no guarding, and no rigidity.  cannot really appreciate uterine size on exam Additional Exam:  vital signs reviewed    Impression & Recommendations:  Problem # 1:  PREGNANCY (ICD-V22.2) Assessment New pregnancy is unplanned, but wanted.  she will come in for labs tomorrow, including 1-hour GTT because of risk factors (obesity and african [place]).  At that point, Lupita Leash will assign OB continuity provider.  previous pregnancy was uncomplicated.    start prenatal vitamins  Best EDD is January 9th from early ultrasound.   Orders: [facility]- Est  Level 4 (99214)Future Orders: Prenatal-[facility] ([id number]) ... 06/12/2010 HIV-[facility] ([id number]) ... 06/12/2010 Sickle Cell Scr-[facility] ([id number]) ... 06/26/2010 Urine Culture-[facility] ([id number]) ... 06/19/2010 Glucose 1 hr-[facility] ([id number]) ... 06/25/2010  Complete Medication List: 1)  Prenatal Vitamins 0.8 Mg Tabs (Prenatal multivit-min-fe-fa) .Marland Kitchen.. 1 tab by mouth daily; dispense generic  Other Orders: U Preg-[facility] ([id number])  Patient Instructions: 1)  It was nice to see you today. 2)  Congratulations. 3)  Make a lab appointment for tomorrow morning to check your prenatal labs and do your sugar test. 4)  Your due date is January 9th 2012. 5)  Make a new OB appointment at the first available slot. Prescriptions: PRENATAL VITAMINS 0.8 MG TABS (PRENATAL MULTIVIT-MIN-FE-FA) 1 tab by mouth daily; dispense generic  #30 x 12   Entered and Authorized by:   Asher Muir MD  Signed by:   Asher Muir MD on 06/25/2009   Method used:   Electronically to        [facility] (retail)       [address]       [place], [place]  [address]       Ph: [phone]       Fax: [phone]   RxID:   [id number]   Laboratory Results   Urine Tests  Date/Time Received: Jun 25, 2009 11:34 AM  Date/Time Reported: Jun 25, 2009 11:36 AM     Urine HCG:  positive Comments: ...........test performed by...........Marland KitchenTerese Door, CMA

## 2010-03-13 NOTE — Assessment & Plan Note (Signed)
Summary: ob,df   Vital Signs:  Patient profile:   25 year old female Height:      62 inches Weight:      228 pounds Pulse rate:   92 / minute BP sitting:   140 / 88  (left arm) Cuff size:   regular  Vitals Entered By: Tessie Fass CMA (December 11, 2009 10:37 AM)  Habits & Providers  Alcohol-Tobacco-Diet     Cigarette Packs/Day: n/a  Allergies: 1)  Butorphanol Tartrate (Butorphanol Tartrate)  Physical Exam  General:  up in chair, NAD Head:  normocephalic and atraumatic.   Eyes:  vision grossly intact.   Mouth:  good dentition.   Neck:  supple and full ROM.   Lungs:  CTAB, no wheezes, rales, rhoncii  Heart:  RRR, no rubs, gallops, murmurs  Abdomen:  gravid abdomen, fundal height > dates but consistent with previous exams, FHR appropriate  Extremities:  2+ peripheral pulses, no edema  Neurologic:  alert & oriented X3.   Psych:  good eye contact, not anxious appearing, and not depressed appearing.     Impression & Recommendations:  Problem # 1:  PREGNANCY (ICD-V22.2) Overall normal fetal development to date-see flowsheet for full details.  Pre-X sxs: Pt reports + intermittent  RUQ pain, however no headache, visual sxs, or other systemic sxs.  Repeat manual BP 128/86. Will check pre-X labs as well as 24 hour urine. Pt instructed extensively of pre-X symptoms  to go to [facility] for evaluation. Pt agreeable to plan. Will followup in 1 week.  Orders: UA Glucose/Protein-[facility] (81002) CBC-[facility] (16109) Comp Met-[facility] ([id number]) 24hr. Urine TP- [facility] [phone]) [facility]- Est Level  3 (13086)  Problem # 2:  BROKEN TOOTH, INFECTED (ICD-873.73) Assessment: Improved Tooth pain currently well controlled w/ intermittent tylenol use. Using <2 gms daily.   Problem # 3:  DEPRESSION (ICD-311) Assessment: Improved Pt has not fill rx yet. However mood is much improved per pt. Pt encouraged to fill rx. Will followup in 1 week.  Her updated medication list for this problem includes:  Fluoxetine Hcl 20 Mg Caps (Fluoxetine hcl) .Marland Kitchen... Take one every day for your mood.  Complete Medication List: 1)  Prenatal Vitamins 0.8 Mg Tabs (Prenatal multivit-min-fe-fa) .Marland Kitchen.. 1 tab by mouth daily; dispense generic 2)  Amoxicillin 500 Mg Caps (Amoxicillin) .Marland Kitchen.. 1 by mouth three times a day x 10 days 3)  Vicodin 5-500 Mg Tabs (Hydrocodone-acetaminophen) .Marland Kitchen.. 1 by mouth three times a day as needed pain 4)  Fluoxetine Hcl 20 Mg Caps (Fluoxetine hcl) .... Take one every day for your mood. 5)  Tylenol With Codeine #3 300-30 Mg Tabs (Acetaminophen-codeine) .... Take 1-2 every 8 hours for pain  Patient Instructions: 1)  It was good to see you again  2)  I will be checking some labs on you today  3)  If you develop any worsening headache, vision changes, abdominal pain, or any concerning pre term labor symptoms (decreased fetal movement, contraction type pain), give [place] a call, ot go to the MAU at [facility] for further evalaution 4)  Come back to see me in 1 week 5)  Otherwise, call with any questions 6)  God Bless,  7)  Doree Albee MD   Orders Added: 1)  UA Glucose/Protein-[facility] [81002] 2)  CBC-[facility] [85027] 3)  Comp Met-[facility] [[id number]] 4)  24hr. 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   Y    Smoking:     n/a    Next visit:     1 wk    Comment:     Pt reports intermittent RUQ pain     Flowsheet View for Follow-up Visit    Estimated weeks of       gestation:     30 1/7    Weight:     228    Blood pressure:   140 / 88    Urine protein:       negative    Urine glucose:    negative    Fundal height:      33.5    FHR:       140s    Taking Vitamins?   Y    Smoking PPD:   n/a    Comment:     Pt reports intermittent RUQ pain     Next visit:     1 wk  Laboratory Results   Urine Tests  Date/Time Received: December 11, 2009 11:01 AM  Date/Time Reported: December 11, 2009 11:08 AM   Routine Urinalysis   Glucose: negative   (Normal Range: Negative) Protein: negative   (Normal Range: Negative)    Comments: ...........test performed by...........Marland KitchenTerese Door, CMA

## 2010-03-13 NOTE — Progress Notes (Signed)
Summary: results  Phone Note Call from Patient Call back at Home Phone [phone]   Caller: Patient Summary of Call: wants to know results of [place] Initial call taken by: De Nurse,  December 26, 2009 3:24 PM  Follow-up for Phone Call        Call and spoke to pt about u/s and ob discussion about findings (had previously discussed this at most recent OB visit). Told pt that she is not a c-section candidate per ob-gyn and will followup with me as previously scheduled. Pt agreeable to plan.  Doree Albee MD December 31, 2009 11:41 AM

## 2010-03-13 NOTE — Progress Notes (Signed)
Summary: phn msg  Phone Note Call from Patient   Caller: Patient Summary of Call: pt is taking bus to get here and will be about 15 minutes late. Initial call taken by: De Nurse,  July 10, 2009 1:49 PM

## 2010-03-13 NOTE — Progress Notes (Signed)
Summary: pt does not feel well, swollen hands and feet   Phone Note Outgoing Call   Call placed by: Elveria Lauderbaugh MD,  February 11, 2010 9:41 AM Call placed to: Patient Summary of Call: 38 5/7 wga.  Spoke with patient.  She states that her feet and swollen.  She is not having contractions.  Feels like she needs to push.  She is not sure if she is constipated, last BM 2 days ago.  Fetal movement normal.  NO bleeding.  NO discharge.   She is also having stabbing pain in vaginal area.  She also has nausea, but no vomiting yet.  She does not feel well.   She was seen in our clinic last week and was hypertensive.  She was sent to MAU.  She was dilated to 4cm on she was in MAU 02/06/10. Advised pt to go to MAU right away (her dad and mom are at home and can drive her).  I will let Dr Alvester Morin know of our plan.  She agreed.  Initial call taken by: Theophil Thivierge MD,  February 11, 2010 9:46 AM     Appended Document: pt does not feel well, swollen hands and feet  Spoke to pt over the phone. Pt feels the urge to push, but unsure if this is from [facility] or baby. Pt has not felt contractions. Encouraged pt to go to MAU. Instructed pt that she may likley be in labor. Will followup with pt psnding MAU eval.  Doree Albee MD

## 2010-03-13 NOTE — Progress Notes (Signed)
Summary: triage  Phone Note Call from Patient Call back at Home Phone [phone]   Caller: Patient Summary of Call: Pt wondering about having a DNA test done while she is pregnant. Initial call taken by: Clydell Hakim,  July 26, 2009 9:20 AM  Follow-up for Phone Call        states the FOB does not believe he is the dad. they had tried for 3 years to have a baby.  they then broke up, gt back together & she is now pregnant.  told he she most likely will have to wait for baby to be born. amnio carries many risks & they may not do it for this reason. she will discuss with md at next appt Follow-up by: Golden Circle RN,  July 26, 2009 9:26 AM

## 2010-03-13 NOTE — Assessment & Plan Note (Signed)
Summary: 17 week OB visit   Vital Signs:  Patient profile:   25 year old female Weight:      211 pounds BP sitting:   138 / 80  Vitals Entered By: Jone Baseman CMA (September 10, 2009 4:07 PM)  Primary Care Provider:  Doree Albee MD   History of Present Illness: 25 YO G2P1 here at 79 0/7 weeks by U/S w/ EDD 02/18/10 here for OB followup:    Dysuria- increased frequency, foul smelling urine. + vaginal discharge x 1 week. Had unprotected sexual intercourse w/ FOB, sxs started 1-2 days after intercourse. No vaginal bleeding.  Pt concerned it may be from FOB.   Toothache: Much improved s/p amoxicillin and vicodin. No odynophagia, fever,  trismus. Able to tolerate by mouth intake well.   OB: see flowsheet.   Habits & Providers  Alcohol-Tobacco-Diet     Cigarette Packs/Day: n/a  Allergies: No Known Drug Allergies  Physical Exam  General:  alert, well-hydrated, and overweight-appearing.   Head:  normocephalic and atraumatic.   Eyes:  vision grossly intact.   Mouth:  good dentition, no exudates, and no posterior lymphoid hypertrophy.   Neck:  supple and full ROM.   Lungs:  normal respiratory effort, normal breath sounds, and no wheezes.   Heart:  normal rate, regular rhythm, and no murmur.   Abdomen:  gravid abdomen, uterine fundus Extremities:  no edema Neurologic:  alert & oriented X3.  alert & oriented X3.   Psych:  normally interactive, good eye contact, not anxious appearing, not suicidal, and not homicidal.  normally interactive, good eye contact, not anxious appearing, not suicidal, and not homicidal.     Impression & Recommendations:  Problem # 1:  PREGNANCY (ICD-V22.2) Otherwise normal fetal development to date. No signifcant life/social stressors per pt. Weight gain appropriate thus far. No HI/SI. mood much improved per pt. Has been in contact w/ medicaid representative.  Pt noted w/ elevated BP on manual BP recheck. Will check random BPs x3 over the next week to  rule out gestational hypertension. No HA, vision changes, abd pain.  Red flags discussed w/ patient.  Anatomy u/s ordered. Followup in 4 weeks.  Orders: Prenatal U/S > 14 weeks - 32355 (Prenatal U/S) GC/Chlamydia-[facility] (87591/87491) [facility]- Est Level  3 (73220)  Problem # 2:  DYSURIA (ICD-788.1) Plan to obtain wet prep, GC/Chl as well as UA/urine cx to rule out infectious source. No CMT on exam. No vaginal bleeding, FHTs reassuring.  Red flags reviewed w/ patient. Will followup w/ patient about results.  Her updated medication list for this problem includes:    Amoxicillin 500 Mg Caps (Amoxicillin) .Marland Kitchen... 1 by mouth three times a day x 10 days  Orders: Urinalysis-[facility] (00000) Urine Culture-[facility] ([id number]) Wet Prep- [facility] (62831) [facility]- Est Level  3 (51761)  Problem # 3:  BROKEN TOOTH, INFECTED (ICD-873.73)  Improved s/p amoxicillin and vicodin. No active signs of infection on exam. Pt encouraged to followup w/ dental clinic when available. Pt agreeable to plan.   Orders: [facility]- Est Level  3 (60737)  Complete Medication List: 1)  Prenatal Vitamins 0.8 Mg Tabs (Prenatal multivit-min-fe-fa) .Marland Kitchen.. 1 tab by mouth daily; dispense generic 2)  Amoxicillin 500 Mg Caps (Amoxicillin) .Marland Kitchen.. 1 by mouth three times a day x 10 days 3)  Vicodin 5-500 Mg Tabs (Hydrocodone-acetaminophen) .Marland Kitchen.. 1 by mouth three times a day as needed pain  Patient Instructions: 1)  It was good to see you today 2)  We will schedule  you for your prenatal ultrasound anatomy scan  3)  I will call you with results of the tests that we did today 4)  You will need to come in for a few BP checks over the next week; come in this friday as well as 2 times next week.  5)  If you have any severe headache, vision changes or worsening abdominal pain, please give [place] a call. 6)  Otherwise follwup with me in 4 weeks.  7)  If you have any further questions, please feel free to call the [facility] at your earliest convenience.     Flowsheet View for  Follow-up Visit    Estimated weeks of       gestation:     17 0/7    Weight:     211    Blood pressure:   138 / 80    Urine Protein:     negative    Urine Glucose:   negative    Urine Nitrite:     negative    Headache:     No    Nausea/vomiting:   No    Edema:     0    Vaginal bleeding:   no    Vaginal discharge:   d/c    FHR:       150s    Fetal activity:     yes    Labor symptoms:   no    Cx Dilation:     0    Taking prenatal vits?   Y    Smoking:     n/a    Next visit:     4 wk    Resident:     Alvester Morin    Preceptor:     Neal   Laboratory Results   Urine Tests  Date/Time Received: September 10, 2009 4:14 PM  Date/Time Reported: September 10, 2009 4:24 PM   Routine Urinalysis   Color: yellow Appearance: Clear Glucose: negative   (Normal Range: Negative) Bilirubin: negative   (Normal Range: Negative) Ketone: negative   (Normal Range: Negative) Spec. Gravity: 1.015   (Normal Range: 1.003-1.035) Blood: negative   (Normal Range: Negative) pH: 6.0   (Normal Range: 5.0-8.0) Protein: negative   (Normal Range: Negative) Urobilinogen: 0.2   (Normal Range: 0-1) Nitrite: negative   (Normal Range: Negative) Leukocyte Esterace: negative   (Normal Range: Negative)    Comments: urine sent for culture ...............test performed by......Marland KitchenBonnie A. [place], MLS (ASCP)cm  Date/Time Received: September 10, 2009 4:14 PM  Date/Time Reported: September 10, 2009 4:46 PM   [employer] Source: vag WBC/hpf: 10-20 Bacteria/hpf: 3+  Rods Clue cells/hpf: few  Negative whiff Yeast/hpf: few Trichomonas/hpf: none Comments: ...............test performed by......Marland KitchenBonnie A. [place], MLS (ASCP)cm

## 2010-03-13 NOTE — Assessment & Plan Note (Signed)
Summary: OB 37 1/7   Vital Signs:  Patient profile:   25 year old female Weight:      232 pounds Temp:     98.4 degrees F oral Pulse rate:   119 / minute Pulse rhythm:   regular BP sitting:   125 / 82  (left arm) Cuff size:   large  Vitals Entered By: Loralee Pacas CMA (January 30, 2010 2:12 PM) CC: ob   Primary Care Provider:  Doree Albee MD  CC:  ob.  History of Present Illness: 25 y/o G2P1001 @ 37 2/7 wga with EDD 02/17/10  Allergies: 1)  Butorphanol Tartrate (Butorphanol Tartrate)   Impression & Recommendations:  Problem # 1:  PREGNANCY (ICD-V22.2) Assessment Unchanged 24 y/o G2P1001 @ 37 1/7 wga with EDD 02/17/10.  She was at MAU 2 days ago (Tues) for headache and toothache.  She was rx oxycodone-apap for pain.  She was also found to have UTI and Rx nitrofurantoin.  Her CVE on Tues was 2/80/high.  She feels much better today.  She has no questions or concerns.   GBS: positive; discussed with pt that this is normal flora found on skin in some pts and that it means she will receive PCN while in labor so that baby will not be exposed upon delivery.   Pain control: would like epidural Fetus gender: Female, she would like circucision.  Discussed cost of this at [facility] and costs done outpt.  Pt will need to be provided with list of providers in town who can give her this service. Will try to breastfeed, for at least  wks. Contraception:  Discussed IUD, Implanon, Depo.  She states that she bleed continuously on Depo.  Pt states she will likely use condoms and ocp.  Discussed progesterone only ocp if she is breastfeeding.  Labor precautions given.   Orders: Medicaid OB visit - [facility] (16109)  Complete Medication List: 1)  Prenatal Vitamins 0.8 Mg Tabs (Prenatal multivit-min-fe-fa) .Marland Kitchen.. 1 tab by mouth daily; dispense generic 2)  Amoxicillin 500 Mg Caps (Amoxicillin) .Marland Kitchen.. 1 by mouth three times a day x 10 days 3)  Fluoxetine Hcl 20 Mg Caps (Fluoxetine hcl) .... Take one every day for  your mood. 4)  Tylenol With Codeine #3 300-30 Mg Tabs (Acetaminophen-codeine) .... Take 1-2 every 8 hours for pain 5)  Zofran Odt 4 Mg Tbdp (Ondansetron) .... Take 1-2 tabs evry 6-8 hours for nausea 6)  Diflucan 150 Mg Tabs (Fluconazole) .... Take 1 tab by mouth x1 7)  Miralax Powd (Polyethylene glycol 3350) .Marland KitchenMarland KitchenMarland Kitchen 17 grams in 8 ozs of water/juice twice daily  quantity: qs  Patient Instructions: 1)  Please schedule a follow-up appointment in 1 week with Dr Alvester Morin for OB. 2)  If you are admitted to [facility] hospital please ask the nurse to call Dr. Alvester Morin. 3)  Kick Counts:  If the baby is not moving as much as normal, then count for 1 hr.  If less than 5 movements in one hour, then drink water, lay down for 30 minutes.  Then count for 2 hrs, should move at least 10 times.  If not go to [facility] hospital. 4)  Go to [facility] hospital for bleeding or water breaking.  5)  I want you to drink plenty of fluids.   6)      Orders Added: 1)  Medicaid OB visit - [facility] [[id number]]      Flowsheet View for Follow-up Visit    Estimated weeks of  gestation:     37 2/7    Weight:     232    Blood pressure:   125 / 82    Headache:     few    Nausea/vomiting:   nausea    Edema:     0    Vaginal bleeding:   no    Vaginal discharge:   no    Fundal height:      38.5    FHR:       140s    Fetal activity:     yes    Labor symptoms:   no    Next visit:     1 wk    Resident:     Ta    Comment:     Headache 4-5 days per week.  Taking oxycodone 5/325 from MAU for toothache/headache, Nitrofurantoin for UTI (from MAU)    Flowsheet View for Follow-up Visit    Estimated weeks of       gestation:     37 2/7    Weight:     232    Blood pressure:   125 / 82    Hx headache?     few    Nausea/vomiting?   nausea    Edema?     0    Bleeding?     no    Leakage/discharge?   no    Fetal activity:       yes    Labor symptoms?   no    Fundal height:      38.5    FHR:       140s    Comment:     Headache 4-5  days per week.  Taking oxycodone 5/325 from MAU for toothache/headache, Nitrofurantoin for UTI (from MAU)    Next visit:     1 wk    Resident:     Ta

## 2010-03-13 NOTE — Letter (Signed)
Summary: Handout Printed  Printed Handout:  - Prenatal-Record-[facility] 

## 2010-03-13 NOTE — Progress Notes (Signed)
Summary: referral  Phone Note Call from Patient Call back at Home Phone [phone]   Caller: Patient Summary of Call: pt can't sleep b/c of tooth ache and wants to know the status of dental referral Initial call taken by: De Nurse,  August 21, 2009 1:35 PM  Follow-up for Phone Call        spoke with patient and she does not have the orange card that qualifies her to be seen by the [facility].  patient only has pregnancy medicaid . advised patient she will need to call a dentist and work out a plan to be self pay as there are no other resources available that we can refer her to.  gave her [facility] [place]'s phone number to try to see if she would qualify for dental services .   appointment scheduled for work in tomorrow regarding tooth pain. Follow-up by: Theresia Lo RN,  August 21, 2009 2:04 PM

## 2010-03-13 NOTE — Assessment & Plan Note (Signed)
Summary: toothache/ls   Vital Signs:  Patient profile:   25 year old female Weight:      209 pounds Temp:     98 degrees F oral Pulse rate:   84 / minute BP sitting:   130 / 80  (right arm)  Vitals Entered By: Arlyss Repress CMA, (August 22, 2009 9:46 AM) CC: tooth ache. upper left x 2-3 weeks. Pain Assessment Patient in pain? yes     Location: tooth Intensity: 10 Onset of pain  x 2 week    Primary Care Provider:  Doree Albee MD  CC:  tooth ache. upper left x 2-3 weeks.Marland Kitchen  History of Present Illness:   Tooth pain in left upper jaw x 2 weeks, past 2 days pain has worsened to point pt unable to sleep, Tried Tylenol no relief. Denies discharge from mouth, but states breath has a foul odor, no fever,pain with by mouth intake. Unabel to afford a dentist. History of mulitple cavities, fillings. Tooth in question broke off a few weeks ago  Habits & Providers  Alcohol-Tobacco-Diet     Tobacco Status: quit < 6 months     Cigarette Packs/Day: n/a  Current Medications (verified): 1)  Prenatal Vitamins 0.8 Mg Tabs (Prenatal Multivit-Min-Fe-Fa) .Marland Kitchen.. 1 Tab By Mouth Daily; Dispense Generic 2)  Amoxicillin 500 Mg Caps (Amoxicillin) .Marland Kitchen.. 1 By Mouth Three Times A Day X 10 Days 3)  Vicodin 5-500 Mg Tabs (Hydrocodone-Acetaminophen) .Marland Kitchen.. 1 By Mouth Three Times A Day As Needed Pain  Allergies (verified): No Known Drug Allergies  Social History: Smoking Status:  quit < 6 months Packs/Day:  n/a  Physical Exam  General:  alert, well-developed, and normal appearance.   Vital signs noted overweight Ears:  erythema left canal TM intact Right canal obscurred by wax, wax removed, TM clear no erythema Mouth:  fair dentition, multiple fillings L upper (last) molar with half of tooth visualized in gumline, TTP, erythema and mild odor, no abscess seen Lungs:  normal respiratory effort and no wheezes.   Heart:  normal rate, regular rhythm, and no murmur.     Impression &  Recommendations:  Problem # 1:  BROKEN TOOTH, INFECTED (ICD-873.73) Assessment New  Start Amox treatment, vicodin as needed pain, given instruction regarding tylenol. Pt to try payment plan with list of dentitst  Orders: [facility]- Est Level  3 (08657)  Problem # 2:  PREGNANCY (ICD-V22.2) Assessment: Unchanged  FHT heard on exam, RTC with PCP in 2 weeks for routine [facility] given script for chewable vitamins   Orders: [facility]- Est Level  3 (84696)  Complete Medication List: 1)  Prenatal Vitamins 0.8 Mg Tabs (Prenatal multivit-min-fe-fa) .Marland Kitchen.. 1 tab by mouth daily; dispense generic 2)  Amoxicillin 500 Mg Caps (Amoxicillin) .Marland Kitchen.. 1 by mouth three times a day x 10 days 3)  Vicodin 5-500 Mg Tabs (Hydrocodone-acetaminophen) .Marland Kitchen.. 1 by mouth three times a day as needed pain  Patient Instructions: 1)  Start the antibiotics and use the pain medication 2)  Follow-up with Dr. Alvester Morin - 2 weeks 3)  Call the dental list and call  [facility] 4)  If you do not take a pain pill you can take 1 extra strength Tylenol in its place.  Prescriptions: VICODIN 5-500 MG TABS (HYDROCODONE-ACETAMINOPHEN) 1 by mouth three times a day as needed pain  #40 x 0   Entered and Authorized by:   Milinda Antis MD   Signed by:   Milinda Antis MD on 08/22/2009   Method used:  Handwritten   RxID:   [id number] AMOXICILLIN 500 MG CAPS (AMOXICILLIN) 1 by mouth three times a day x 10 days  #30 x 0   Entered and Authorized by:   Milinda Antis MD   Signed by:   Milinda Antis MD on 08/22/2009   Method used:   Electronically to        [facility] [address] (retail)       [address]       [place], [place]  [address]       Ph: [phone]       Fax: [phone]   RxID:   [id number]    Flowsheet View for Follow-up Visit    Estimated weeks of       gestation:     14 2/7    Weight:     209    Blood pressure:   130 / 80    Hx headache?     +secondary in toothache    Nausea/vomiting?    nausea    Edema?     0    Bleeding?     no    Leakage/discharge?   no    Fetal activity:       N/A    Labor symptoms?   no    Fundal height:      n/a    FHR:       150    Fetal position:      N/A    Taking Vitamins?   Y    Smoking PPD:   n/a    Next visit:     2 wk    Resident:     Jeanice Lim    Preceptor:     [place]

## 2010-03-13 NOTE — Assessment & Plan Note (Signed)
Summary: ob visit/eo   Vital Signs:  Patient profile:   25 year old female Height:      62 inches Weight:      224.1 pounds BMI:     41.14 Temp:     98.1 degrees F oral Pulse rate:   90 / minute BP sitting:   127 / 85  (right arm) Cuff size:   regular CC: OB  34  1/7 Is Patient Diabetic? No Pain Assessment Patient in pain? yes     Location: lower back   CC:  OB  34  1/7.  Habits & Providers  Alcohol-Tobacco-Diet     Tobacco Status: quit     Cigarette Packs/Day: n/a  Allergies: 1)  Butorphanol Tartrate (Butorphanol Tartrate)  Social History: Smoking Status:  quit  Physical Exam  General:  alert and well-developed.   Head:  normocephalic and atraumatic.   Eyes:  vision grossly intact.   Nose:  no external deformity.   Mouth:  good dentition.   Neck:  supple and full ROM.   Lungs:  normal respiratory effort and normal breath sounds.   Heart:  normal rate, regular rhythm, and no murmur.   Abdomen:  gravid abdomen, size> dates but consistent w/ previous exams, fetal heart tones reassuring Extremities:  2+ peripheral pulses, trace edema  Neurologic:  alert & oriented X3.  Psych:  PHQ score-9   Impression & Recommendations:  Problem # 1:  PREGNANCY (ICD-V22.2) 24 YO G2P1001 here for followup appt. Otherwise normal fetal development to date. Vaginal discharge from previous visit resiolved per pt. No vaginal bleeding. No abdominal pain. Fetal activity at baseline per pt. Pt does report nausea. However, this has somewhat improved w/ zofran, though pt sometimes unable to swallow zofran. Plan to rx ODT zofran for nausea. Will also rx miralax for constipation. See flowsheet for full details. Will follow up in 1 week for OB visit.  11/23 Labs: GC, Chl, RPR, HIV--> negative Orders: UA Glucose/Protein-[facility] (81002)  Complete Medication List: 1)  Prenatal Vitamins 0.8 Mg Tabs (Prenatal multivit-min-fe-fa) .Marland Kitchen.. 1 tab by mouth daily; dispense generic 2)  Amoxicillin 500 Mg  Caps (Amoxicillin) .Marland Kitchen.. 1 by mouth three times a day x 10 days 3)  Fluoxetine Hcl 20 Mg Caps (Fluoxetine hcl) .... Take one every day for your mood. 4)  Tylenol With Codeine #3 300-30 Mg Tabs (Acetaminophen-codeine) .... Take 1-2 every 8 hours for pain 5)  Zofran Odt 4 Mg Tbdp (Ondansetron) .... Take 1-2 tabs evry 6-8 hours for nausea 6)  Diflucan 150 Mg Tabs (Fluconazole) .... Take 1 tab by mouth x1 7)  Miralax Powd (Polyethylene glycol 3350) .Marland KitchenMarland KitchenMarland Kitchen 17 grams in 8 ozs of water/juice twice daily  quantity: qs  Patient Instructions: 1)  It was good to see you today 2)  The baby is doing well 3)  I will prescribe oral dissolving zofran for your nausea 4)  I will also prescribe miralax for your constipation  5)  If you develop any severe abominal pain, vaginal bleeding, or decreased fetal movement please give [place] a call or [facility] 6)  Otheriwise, come back to see me in 1 week 7)  God Bless,  8)  Doree Albee MD   Prescriptions: Eather Colas  POWD (POLYETHYLENE GLYCOL 3350) 17 grams in 8 ozs of water/juice twice daily  quantity: qs  #1 x 3   Entered and Authorized by:   Doree Albee MD   Signed by:   Doree Albee MD on 01/08/2010  Method used:   [facility]       [address]       [place], [place]  [address]       Ph: [phone]       Fax: [phone]   RxID:   [id number] ZOFRAN ODT 4 MG TBDP (ONDANSETRON) take 1-2 tabs evry 6-8 hours for nausea  #90 x 0   Entered and Authorized by:   Doree Albee MD   Signed by:   Doree Albee MD on 01/08/2010   Method used:   Electronically to        [facility] (retail)       [address]       [place], [place]  [address]       Ph: [phone]       Fax: [phone]   RxID:   [id number]    Orders Added: 1)  UA Glucose/Protein-[facility] [81002]     Flowsheet View for Follow-up Visit    Estimated weeks of       gestation:      34 1/7    Weight:     224.1    Blood pressure:   127 / 85    Urine protein:       negative    Urine glucose:    negative    Hx headache?     No    Nausea/vomiting?   nausea    Edema?     0    Bleeding?     no    Leakage/discharge?   no    Fetal activity:       yes    Labor symptoms?   no    Fundal height:      37.0    FHR:       140s    Taking Vitamins?   Y    Smoking PPD:   n/a    Comment:     Still w/ persistent nausea; improved when can swallow zofran     Next visit:     1 wk    Laboratory Results   Urine Tests  Date/Time Received: January 08, 2010 10:28 AM  Date/Time Reported: January 08, 2010 10:41 AM   Routine Urinalysis   Color: yellow Appearance: Clear Glucose: negative   (Normal Range: Negative) Protein: negative   (Normal Range: Negative)    Comments: ...............test performed by......Marland KitchenBonnie A. [place], MLS (ASCP)cm      Appended Document: ob visit/eo    Clinical Lists Changes  Orders: Added new Test order of [facility]- Est Level  3 (06301) - Signed

## 2010-03-13 NOTE — Progress Notes (Signed)
Summary: Positive Gonorrhea, Positive Chlamydia  Phone Note Outgoing Call Call back at Work Phone [phone]   Call placed by: Romero Belling MD,  May 09, 2009 5:09 PM Call placed to: Patient Summary of Call: Called to report positive Gonorrhea and Chlamydia.  There is no Julio at this number.

## 2010-03-13 NOTE — Assessment & Plan Note (Signed)
Summary: ob ck,df   Vital Signs:  Patient profile:   25 year old female Weight:      211 pounds Temp:     98.4 degrees F oral Pulse rate:   93 / minute Pulse rhythm:   regular BP sitting:   118 / 73  (left arm) Cuff size:   large  Vitals Entered By: Loralee Pacas CMA (August 13, 2009 4:09 PM)  Primary Care Provider:  Doree Albee MD   History of Present Illness: 25 YO G2P1001 here for followup OB visit @ 12 5/7 weeks by U/S w/ EDD 02/18/10. Pt denies any HA, abd pain, vaginal bleeding. Vaginal discharge x 1-non purulent; non-foul smelling. No dysuria. Pt does report intermittent nausea and emesis x 1 episode.  Pt denies SI/HI. Pt w/ intermittent HA-relieved w/ tylenol. Pt reports frustration w/ FOB secondary to FOB inquiring of paternity status of baby(mom w/ multiple partners prior to conception). No domestic/verbal abuse per pt. Good home resources.   Allergies: No Known Drug Allergies  Physical Exam  General:  alert, well-developed, and normal appearance.   Head:  normocephalic and atraumatic.   Eyes:  vision grossly intact.   Ears:  R ear normal and L ear normal.   Nose:  no external deformity.   Mouth:  good dentition.   Neck:  supple and full ROM.   Chest Wall:  no deformities.   Lungs:  normal respiratory effort and no wheezes.   Heart:  normal rate, regular rhythm, and no murmur.   Abdomen:  gravid abdomen, fundal height below umbilicus   Impression & Recommendations:  Problem # 1:  PREGNANCY (ICD-V22.2) Discussed dietary changes for nausea. Appropriate weight gain and fundal growth to date. Unbale to fully assess heart tones-however intra-uterine heart tones heard transiently.  Pt declining quad screening. Advised patient to hold on amniocentesis/chorionic villus sampling for paternity testing-waiting until after birth of child. Pt agreeable. PHQ-9 score at 12-improved from previous visit. No SI-HI. Reemphasized behavioral options for pt. Plan to re-refer patient  through [facility] for behavioral health resources (pink form). Psychiatric red flags discussed. Otherwise plan to followup in 4 weeks.   Serologies: H&H: 11.3/34.6, HepBSag neg, RI, A+, HIV NR, SS trait pos-Will consider Hgb electropheresis for further assessment.   Early 1 hr GTT @ 95 Orders: Urinalysis-[facility] (00000) [facility]- Est Level  3 ([address])  Complete Medication List: 1)  Prenatal Vitamins 0.8 Mg Tabs (Prenatal multivit-min-fe-fa) .Marland Kitchen.. 1 tab by mouth daily; dispense generic  Other Orders: Dental Referral (Dentist)  Patient Instructions: 1)  It was good to see you today.  2)  Try taking vitamin b6 andn eating smaller meals for nausea.  3)  Continue to use tylenol for headache- use no more than 6 extra strength pills in one day.  4)  Try eating a high fruit and vegetable diet 5)  If you have any sever abd pain or vaginal bleeding, go to [facility] ED for evaluation 6)  If you have any other questions, feel free to call the [facility] 7)  God Bless, 8)  Doree Albee MD  Laboratory Results   Urine Tests  Date/Time Received: August 13, 2009 4:15 PM  Date/Time Reported: August 13, 2009 4:43 PM   Routine Urinalysis   Color: yellow Appearance: Clear Glucose: negative   (Normal Range: Negative) Bilirubin: negative   (Normal Range: Negative) Ketone: negative   (Normal Range: Negative) Spec. Gravity: 1.020   (Normal Range: 1.003-1.035) Blood: negative   (Normal Range: Negative)  pH: 5.5   (Normal Range: 5.0-8.0) Protein: negative   (Normal Range: Negative) Urobilinogen: 0.2   (Normal Range: 0-1) Nitrite: negative   (Normal Range: Negative) Leukocyte Esterace: negative   (Normal Range: Negative)    Comments: ...............test performed by......Marland KitchenBonnie A. [place], MLS (ASCP)cm      Flowsheet View for Follow-up Visit    Estimated weeks of       gestation:     13 1/7    Weight:     211    Blood pressure:   118 / 73    Urine protein:       negative    Urine glucose:    negative    Urine  nitrite:     negative    Hx headache?     +    Nausea/vomiting?   nausea    Edema?     0    Bleeding?     no    Leakage/discharge?   d/c    Fetal activity:       no    Labor symptoms?   no    Next visit:     4 wk    Resident:     SN    Preceptor:     Dianah Field Initial Intake Information    Positive HCG by: At [facility]    Race: Black    Marital status: Single    Occupation: homemaker    Education (last grade completed): 12th grade     Number of children at home: 1  FOB Information    Husband/Father of baby: Merilyn Baba     FOB occupation General Labor     Phone: [phone]  Menstrual History    LMP (date): 04/17/2009    [facility]: 02/18/2010    LMP - Character: light    Menarche: 12 years    Menses interval: 25 days    Menstrual flow 5 days    On BCP's at conception: no   Prenatal Visit EDC Confirmation:    [facility] working [facility]: 02/18/2010   Flowsheet View for Follow-up Visit    Estimated weeks of       gestation:     13 1/7    Weight:     211    Blood pressure:   118 / 73    Urine Protein:     negative    Urine Glucose:   negative    Urine Nitrite:     negative    Headache:     +    Nausea/vomiting:   nausea    Edema:     0    Vaginal bleeding:   no    Vaginal discharge:   d/c    Fetal activity:     no    Labor symptoms:   no    Next visit:     4 wk    Resident:     SN    Preceptor:     Sheffield Slider

## 2010-03-13 NOTE — Assessment & Plan Note (Signed)
Summary: OB Visit 26 2/7 weeks   Vital Signs:  Patient profile:   25 year old female Height:      62 inches Weight:      229 pounds Pulse rate:   97 / minute BP sitting:   113 / 69  (left arm) Cuff size:   large  Vitals Entered By: Tessie Fass CMA (November 14, 2009 3:21 PM) CC: OB visit   CC:  OB visit.  Habits & Providers  Alcohol-Tobacco-Diet     Cigarette Packs/Day: n/a  Allergies: No Known Drug Allergies  Physical Exam  General:  alert and overweight-appearing.   Head:  normocephalic and atraumatic.   Eyes:  vision grossly intact.   Nose:  no external deformity.   Mouth:  good dentition.   Neck:  supple and full ROM.   Lungs:  normal respiratory effort and normal breath sounds.   Heart:  normal rate, regular rhythm, and no murmur.   Abdomen:  gravid abdomen, fundal height appropriate in setting of baseline obesity, fetal heart tones adequate  Extremities:  2+ peripheral pulses, no edema  Neurologic:  alert & oriented X3.  + strength grasp and sensation in upper extremities bilaterally Skin:  vitiligo noted on L hip, intermittent coin shaped macular lesions, non tender to palpation  Psych:  Oriented X3, good eye contact, and not anxious appearing.     Impression & Recommendations:  Problem # 1:  PREGNANCY (ICD-V22.2) Otherwise normal fetal development and growth to date. See flowsheet for full details. Overall social situation w/ FOB improved per pt. Has decreased overall contact per pt. Tolerating vitamins per pt. Adequate weight gain to date. Plan to also start pt on iron for low hemoglobin. Also plan to obtain 1 hr glucola today. Will refer pt to PB clinic for next vist w/ subsequent followup PCP visit 1-2 weeks therafter. 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 No other systemic symptoms per pt. Has had history of eczema in the past per pt.  -Overall skin exzm consistent with nummular eczema. Itching has been improving w/ OTC lotion/lubrication per pt. Plan to continue with surveillance. Broached topic of using low-dose topical steroid for  targeted areas if sxs not improved at next clinical visit. Pt agreeable to plan.   Problem # 3:  CARPAL TUNNEL SYNDROME (ICD-354.0) Pt reports hx/o carpal tunnel syndrome in the past. Now with intermittent numbness in hands bilaterally. Strength and function intact per pt. Numbness worse at night per pt.  -Pt instructed to use bilateral wrist splints at night for alleviation of pain in setting of baseline carpal tunnel disease. Pt agreeable to plan.   Complete Medication List: 1)  Prenatal Vitamins 0.8 Mg Tabs (Prenatal multivit-min-fe-fa) .Marland Kitchen.. 1 tab by mouth daily; dispense generic 2)  Amoxicillin 500 Mg Caps (Amoxicillin) .Marland Kitchen.. 1 by mouth three times a day x 10 days 3)  Vicodin 5-500 Mg Tabs (Hydrocodone-acetaminophen) .Marland Kitchen.. 1 by mouth three times a day as needed pain 4)  Ferrous Sulfate 325 (65 Fe) Mg Tabs (Ferrous sulfate) .... Take 1 tablet daily  Patient Instructions: 1)   It was good to see you today 2)   If you have any worsening in headache, visual symptoms, abdominal pain, or any other concerns go to the  [facility] ED for further evaluation 3)  Continue using mostruizing creams for your eczema. However, we may need low dose topical steroid to treat if the itching doesnt improve 4)  Use wrist splints on each hand at night for your carpal tunnel, this should help relieve the pain and numbness 5)  Otherwise, set up an appointment with OB clinic in 2 weeks 6)   God Bless,  7)  Catherine Albee MD Prescriptions: FERROUS SULFATE 325 (65 FE) MG TABS (FERROUS SULFATE) take 1 tablet daily  #30 x 3   Entered and Authorized by:   Catherine Albee MD   Signed by:   Catherine Albee MD on 11/14/2009    Method used:   Electronically to        [facility] (retail)       [address]       [place], [place]  [address]       Ph: [phone]       Fax: [phone]   RxID:   [id number]    OB Initial Intake Information    Positive HCG by: At [facility]    Race: Black    Marital status: Single    Occupation: homemaker    Education (last grade completed): 12th grade     Number of children at home: 1  FOB Information    Husband/Father of baby: Merilyn Baba     FOB occupation General Labor     Phone: [phone]  Menstrual History    LMP (date): 04/17/2009    LMP - Character: light    Menarche: 12 years    Menses interval: 25 days    Menstrual flow 5 days    On BCP's at conception: no   Flowsheet View for Follow-up Visit    Estimated weeks of       gestation:     26 2/7    Weight:     229    Blood pressure:   113 / 69    Headache:     No    Nausea/vomiting:   No    Edema:     0    Vaginal bleeding:   no    Vaginal discharge:   no    Fundal height:      18.0    FHR:       140s    Fetal activity:     yes    Labor symptoms:   no    Taking prenatal vits?   Y    Smoking:     n/a    Next visit:     2 wk    Resident:     SN    Flowsheet View for Follow-up Visit    Estimated weeks of       gestation:     26 2/7    Weight:     229    Blood pressure:   113 / 69    Hx headache?     No    Nausea/vomiting?   No    Edema?     0    Bleeding?     no    Leakage/discharge?   no    Fetal activity:       yes    Labor symptoms?   no    Fundal height:      18.0    FHR:       140s  Taking Vitamins?   Y    Smoking PPD:   n/a    Next visit:     2 wk    Resident:     SN

## 2010-03-13 NOTE — Letter (Signed)
Summary: Positive Gonorrhea, Positive Chlamydia  [facility]  [address]   [place], [place] [address]   Phone: [phone]  Fax: [phone]    05/09/2009  [address] [place], [place]  [address]  Dear Ms. Catherine Simmons,  I attempted to reach you by phone, but neither of the numbers we have listed are correct.  Please call my office to make sure we have your updated telephone number and address.  Gonorrhea -- positive, you have been treated Chlamydia -- positive, you have been treated  Make sure that all of your sexual partners are notified that you have tested positive for both of these sexually transmitted diseases and tell them that they must get treated as well.  Sincerely, Romero Belling MD  Appended Document: Positive Gonorrhea, Positive Chlamydia mailed.

## 2010-03-13 NOTE — Progress Notes (Signed)
  Phone Note Outgoing Call   Call placed by: Ellery Plunk MD,  February 15, 2010 2:24 PM Summary of Call: called pt to let her know about baby's bili level.  direct bili was 13.4.  This is high/int risk and needs to be rechecked in 48 hours.  Mom is not sure when her nurse visit is..  I told her she will need to come in on Monday to have the level rechecked.  Please call her and make an appt for her to go to the lab.  Initial call taken by: Ellery Plunk MD,  February 15, 2010 2:26 PM     Appended Document:  baby has appt for wt and billi check

## 2010-03-13 NOTE — Assessment & Plan Note (Signed)
Summary: OB 38 and 1/7 to MAU for BP   Vital Signs:  Patient profile:   25 year old female Height:      62 inches Weight:      234 pounds BMI:     42.95 Temp:     98.2 degrees F oral Pulse rate:   70 / minute BP sitting:   161 / 93  (left arm) Cuff size:   large  Vitals Entered By: Jimmy Footman, CMA (February 06, 2010 3:34 PM) CC: ob visit Is Patient Diabetic? No Pain Assessment Patient in pain? yes        CC:  ob visit.  Habits & Providers  Alcohol-Tobacco-Diet     Cigarette Packs/Day: n/a  Allergies: 1)  Butorphanol Tartrate (Butorphanol Tartrate)  Physical Exam  General:  Very uncomfortable appearing.  Tearful.   Genitalia:  Nl external genitalia.  Nl vaginal mucosa.  No cervical lesions.  + white discharge, no pooling.  Amnio swab negative.  Cervical exam in flowsheet. Psych:  Tearful, but reports depression is better   Impression & Recommendations:  Problem # 1:  PREGNANCY (ICD-V22.2) 24 you G2P1 at 38 and 1/7 by 7 week sono presents with elevated BP, HA, visual changes and epigastric pain.  (Symptoms have been present during pregnancy and pt has been ruled out for preeclampsia before).  Also with concern of SROM, but SSE negative.  Will send to MAU for further evaluation for concerns of preeclampsia.   Orders: UA Glucose/Protein-[facility] (81002) Urine Culture-[facility] ([id number]) Miscellaneous Lab Charge-[facility] (14782)  Problem # 2:  GROUP B STREPTOCOCCUS CARRIER (ICD-V02.51) Needs intrapartum prophylaxis.  Problem # 3:  DEPRESSION (ICD-311) Improved per pt.  Feels safe at home. Her updated medication list for this problem includes:    Fluoxetine Hcl 20 Mg Caps (Fluoxetine hcl) .Marland Kitchen... Take one every day for your mood.  Complete Medication List: 1)  Prenatal Vitamins 0.8 Mg Tabs (Prenatal multivit-min-fe-fa) .Marland Kitchen.. 1 tab by mouth daily; dispense generic 2)  Amoxicillin 500 Mg Caps (Amoxicillin) .Marland Kitchen.. 1 by mouth three times a day x 10 days 3)  Fluoxetine Hcl 20 Mg Caps  (Fluoxetine hcl) .... Take one every day for your mood. 4)  Tylenol With Codeine #3 300-30 Mg Tabs (Acetaminophen-codeine) .... Take 1-2 every 8 hours for pain 5)  Zofran Odt 4 Mg Tbdp (Ondansetron) .... Take 1-2 tabs evry 6-8 hours for nausea 6)  Diflucan 150 Mg Tabs (Fluconazole) .... Take 1 tab by mouth x1 7)  Miralax Powd (Polyethylene glycol 3350) .Marland KitchenMarland KitchenMarland Kitchen 17 grams in 8 ozs of water/juice twice daily  quantity: qs   Orders Added: 1)  UA Glucose/Protein-[facility] [81002] 2)  Urine Culture-[facility] [[id number]] 3)  Miscellaneous Lab Charge-[facility] [78469]    Prenatal Visit Concerns noted: Pt reports pain and trouble breathing since last night.  Pain is epigastric and in low abdomen, like a circle.  Unsure if this might be contractions.  Reports clear watery discharge in underwear.  Pain and leaking started 2 days ago.  Still with HA, Still sees stars, (no change since previous visit).  + nausea/vominting without relief from zofran.  REports depression is better and she is not in contact with FOP.  Denies feeling unsafe about him.  He is still in contact with her mother.     Flowsheet View for Follow-up Visit    Estimated weeks of       gestation:     38 2/7    Weight:     234  Blood pressure:   161 / 93    Urine Protein:     negative    Urine Glucose:   negative    Headache:     +    Nausea/vomiting:   nausea    Edema:     TrLE    Vaginal bleeding:   no    Vaginal discharge:   leak?    Fundal height:      39    FHR:       130s    Fetal activity:     yes    Labor symptoms:   few ctx    Fetal position:      by cervical examvertex    Cx Dilation:     2-3    Cx Effacement:   20%    Cx Station:     high    Taking prenatal vits?   Y    Smoking:     n/a    Next visit:     1 wk   Laboratory Results   Urine Tests  Date/Time Received: February 06, 2010 4:09 PM  Date/Time Reported: February 06, 2010 4:11 PM   Routine Urinalysis   Glucose: negative   (Normal Range: Negative) Protein:  negative   (Normal Range: Negative)    Comments: urine sent for culture ...............test performed by......Marland KitchenBonnie A. [place], MLS (ASCP)cm  Date/Time Received: February 06, 2010 3:57 PM  Date/Time Reported: February 06, 2010 4:03 PM   Other Tests  [place]: absent Comments: ...........test performed by...........Marland KitchenTerese Door, CMA      Appended Document: OB 38 and 1/7 to MAU for BP Discussed with Sarah at [facility].  Will send pt now.  Appended Document: OB 38 and 1/7 to MAU for BP Repeat BP done (large cuff) 150/82  Appended Document: OB 38 and 1/7 to MAU for BP    Clinical Lists Changes  Orders: Added new Test order of Medicaid OB visit - [facility] [phone]) - Signed

## 2010-03-13 NOTE — Assessment & Plan Note (Signed)
Summary: OB/KH   Vital Signs:  Patient profile:   25 year old female Height:      62 inches Weight:      231.8 pounds Pulse rate:   98 / minute BP sitting:   130 / 78  (right arm) Cuff size:   large  Vitals Entered By: Tessie Fass CMA (January 22, 2010 8:54 AM) CC: OB Visit Pain Assessment Patient in pain? no        CC:  OB Visit.  Allergies: 1)  Butorphanol Tartrate (Butorphanol Tartrate)  Physical Exam  General:  alert and overweight-appearing.   Eyes:  vision grossly intact, pupils equal, pupils round, and pupils reactive to light.   Mouth:  good dentition.   Lungs:  CTAB, no wheezes, rales, rhoncii Heart:  RRR, no rubs, gallops, murmurs  Abdomen:  + RUQ pain gravid abdomen, FHR reassuring  Extremities:  2+ peripheral pulses, no edema  Neurologic:  alert & oriented X3.     Impression & Recommendations:  Problem # 1:  PREGNANCY (ICD-V22.2) 24 YO G2P1001 here at 36 1/7 weeks here for routine prenatal visit. Pt noted to have + HA, blurry vision, RUQ pain. Pt has been worked up in the past with for pre-X with negative workup. Case precepted with Dr. [place]. Plan to send pt to MAU for evaluation of pre-x sxs. Case discussed on batphone with on call intern. GBS obtained prior to visit. Will otherwise followup in 1 week if cleared from MAU. Pt agreeabel to plan. See flowsheet for full details.  Orders: UA Glucose/Protein-[facility] (81002) Grp B Probe-[facility] ([address])  Complete Medication List: 1)  Prenatal Vitamins 0.8 Mg Tabs (Prenatal multivit-min-fe-fa) .Marland Kitchen.. 1 tab by mouth daily; dispense generic 2)  Amoxicillin 500 Mg Caps (Amoxicillin) .Marland Kitchen.. 1 by mouth three times a day x 10 days 3)  Fluoxetine Hcl 20 Mg Caps (Fluoxetine hcl) .... Take one every day for your mood. 4)  Tylenol With Codeine #3 300-30 Mg Tabs (Acetaminophen-codeine) .... Take 1-2 every 8 hours for pain 5)  Zofran Odt 4 Mg Tbdp (Ondansetron) .... Take 1-2 tabs evry 6-8 hours for nausea 6)  Diflucan 150  Mg Tabs (Fluconazole) .... Take 1 tab by mouth x1 7)  Miralax Powd (Polyethylene glycol 3350) .Marland KitchenMarland KitchenMarland Kitchen 17 grams in 8 ozs of water/juice twice daily  quantity: qs  Patient Instructions: 1)  It was good to see you today 2)  I am checking your GBS today 3)  Your mood and nausea seem to be better 4)  I am sending you to [facility] to make sure that you are not developing preeclampsia 5)  Come back to see me in 1 week    Orders Added: 1)  UA Glucose/Protein-[facility] [81002] 2)  Grp B Probe-[facility] [[id number]]    Laboratory Results   Urine Tests  Date/Time Received: January 22, 2010 9:03 AM  Date/Time Reported: January 22, 2010 9:48 AM   Routine Urinalysis   Glucose: negative   (Normal Range: Negative) Protein: negative   (Normal Range: Negative)    Comments: ...............test performed by......Marland KitchenBonnie A. [place], MLS (ASCP)cm      Flowsheet View for Follow-up Visit    Estimated weeks of       gestation:     36 1/7    Weight:     231.8    Blood pressure:   130 / 78    Urine protein:       negative    Urine glucose:    negative  Hx headache?     +    Nausea/vomiting?   improved     Edema?     0    Bleeding?     no    Leakage/discharge?   no    Fetal activity:       yes    Labor symptoms?   no    Fundal height:      38.0    FHR:       140s    Fetal position:      vertex    Cx dilation:     0    Cx effacement:   0    Fetal station:     high    Taking Vitamins?   Y    Comment:     nausea much improved s/p regular BMs    Next visit:     1 wk    Resident:     SN    Preceptor:     [place]   Appended Document: OB/KH    Clinical Lists Changes  Orders: Added new Test order of [facility]- Est Level  3 ([address]) - Signed

## 2010-03-13 NOTE — Assessment & Plan Note (Signed)
Summary: OB Visit @ 31 1/7 weeks   Vital Signs:  Patient profile:   25 year old female Height:      62 inches Weight:      227 pounds Pulse rate:   108 / minute BP sitting:   138 / 68  (right arm) Cuff size:   large  Vitals Entered By: Tessie Fass CMA (December 18, 2009 9:20 AM) CC: OB Visit Pain Assessment Patient in pain? no        CC:  OB Visit.  Allergies: 1)  Butorphanol Tartrate (Butorphanol Tartrate)  Physical Exam  General:  up in bed, NAD Eyes:  vision grossly intact.   Nose:  no external deformity.   Mouth:  good dentition.   Neck:  supple and full ROM.   Lungs:  CTAB, no wheezes, rales, rhoncii  Heart:  RRR, no rubs, gallops, murmurs  Abdomen:  gravid abdomen, fundal height > dates but consistent with previous exams, FHR appropriate  Extremities:  2+ peripheral pulses, no edema  Neurologic:  alert & oriented X3.   Psych:  good eye contact, not anxious appearing, and not depressed appearing.     Impression & Recommendations:  Problem # 1:  PREGNANCY (ICD-V22.2) Overall normal fetal development to date-see flowsheet for full details.  Reviewed pre-x labs with patient. Overall stable/no signs/sxs of Pre-X per pt. Plan to send for U/S for EFW in setting of size consitently >dates.   Will follow up next week. Will obtain GC/Chl and GBS at 36 week mark.  BPs noted to be 130s/90s throughout pregnancy w/ question of BPs trending toward gestational HTN. Will consider NST/BPP if BPs trend toward >140/90 in upcoming visits (currently asymptomatic).   Orders: Prenatal U/S > 14 weeks - [address] (Prenatal U/S)  Problem # 2:  BROKEN TOOTH, INFECTED (ICD-873.73) Tooth pain currently well controlled w/ intermittent tylenol use. Using <2 gms daily.   Problem # 3:  DEPRESSION (ICD-311) Assessment: Unchanged No HI/SI per pt. PHQ-9 score (essentially unchanged form 10/27 visit.) Still not taking prozac. Pt instructed/encouraged  to begin taking. Also rxd zofran to take w/  medication as pt reports nausea with taking fluoxetine. Will re-adress at next clinic visit.  Her updated medication list for this problem includes:    Fluoxetine Hcl 20 Mg Caps (Fluoxetine hcl) .Marland Kitchen... Take one every day for your mood.  Complete Medication List: 1)  Prenatal Vitamins 0.8 Mg Tabs (Prenatal multivit-min-fe-fa) .Marland Kitchen.. 1 tab by mouth daily; dispense generic 2)  Amoxicillin 500 Mg Caps (Amoxicillin) .Marland Kitchen.. 1 by mouth three times a day x 10 days 3)  Vicodin 5-500 Mg Tabs (Hydrocodone-acetaminophen) .Marland Kitchen.. 1 by mouth three times a day as needed pain 4)  Fluoxetine Hcl 20 Mg Caps (Fluoxetine hcl) .... Take one every day for your mood. 5)  Tylenol With Codeine #3 300-30 Mg Tabs (Acetaminophen-codeine) .... Take 1-2 every 8 hours for pain 6)  Zofran 4 Mg Tabs (Ondansetron hcl) .Marland Kitchen.. 1 tab every 6-8 hours as needed for nausea  Patient Instructions: 1)  It was good to see you again  2)  If you develop any worsening headache, vision changes, abdominal pain, or any concerning pre term labor symptoms (decreased fetal movement, contraction type pain), give [place] a call, ot go to the MAU at [facility] for further evalaution 3)  I will prescribe some zofran for your nausea  4)  Start taking the prozac as prescribed 5)  Come back to see me in 1 week 6)  Otherwise,  call with any questions 7)  God Bless,  8)  Doree Albee MD Prescriptions: ZOFRAN 4 MG TABS (ONDANSETRON HCL) 1 tab every 6-8 hours as needed for nausea  #30 x 3   Entered and Authorized by:   Doree Albee MD   Signed by:   Doree Albee MD on 12/18/2009   Method used:   Electronically to        [facility] (retail)       [address]       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     Number of children at home: 1  FOB Information    Husband/Father of baby: Merilyn Baba     FOB occupation General Labor     Phone: [phone]  Menstrual History    LMP (date): 04/17/2009    LMP - Character: light    Menarche: 12 years    Menses interval: 25 days    Menstrual flow 5 days    On BCP's at conception: no   Flowsheet View for Follow-up Visit    Estimated weeks of       gestation:     31 1/7    Weight:     227    Blood pressure:   138 / 68    Headache:     +    Nausea/vomiting:   nausea    Edema:     TrLE    Vaginal bleeding:   no    Vaginal discharge:   no    OB Initial Intake Information    Positive HCG by: At [facility]    Race: Black    Marital status: Single    Occupation: homemaker    Education (last grade completed): 12th grade     Number of children at home: 1  FOB Information    Husband/Father of baby: Merilyn Baba     FOB occupation General Labor     Phone: [phone]  Menstrual History    LMP (date): 04/17/2009    LMP - Character: light    Menarche: 12 years    Menses interval: 25 days    Menstrual flow 5 days    On BCP's at conception: no  Appended Document: OB Visit @ 31 1/7 weeks    Clinical Lists Changes  Orders: Added new Test order of [facility]- Est Level  3 ([address]38) - Signed
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
## 2010-03-13 NOTE — Progress Notes (Signed)
  Phone Note Outgoing Call   Call placed by: Paula Compton MD,  December 06, 2009 9:58 AM Call placed to: Patient Summary of Call: Called [phone] to report negative labs. Left voice message.  I INTEND TO LET HER KNOW THAT ALL HER LABS DONE YESTERDAY WERE NORMAL. Initial call taken by: Paula Compton MD,  December 06, 2009 9:59 AM

## 2010-03-13 NOTE — Assessment & Plan Note (Signed)
Summary: ob visit/eo   Vital Signs:  Patient profile:   25 year old female Height:      62 inches Weight:      226 pounds Temp:     98.7 degrees F oral Pulse rate:   90 / minute BP sitting:   130 / 82  (left arm) Cuff size:   regular  Vitals Entered By: Arlyss Repress CMA, (January 15, 2010 1:44 PM) CC: OB Visit   CC:  OB Visit.  Allergies: 1)  Butorphanol Tartrate (Butorphanol Tartrate)  Physical Exam  General:  alert and well-developed.   Head:  normocephalic and atraumatic.   Eyes:  vision grossly intact.   Nose:  no external deformity.   Mouth:  good dentition.   Neck:  supple and full ROM.   Lungs:  normal respiratory effort.   Heart:  normal rate, regular rhythm, and no murmur.   Abdomen:  gravid abdomen, size> dates but consistent w/ previous exams, fetal heart tones reassuring Extremities:  2+ peripheral pulses, trace edema    Impression & Recommendations:  Problem # 1:  PREGNANCY (ICD-V22.2) 24 YO G2P1001 here for followup appt. Otherwise normal fetal development to date. No vaginal bleeding. No abdominal pain. Fetal activity at baseline per pt. Nausea improved with zofran and multiple BMs (likely contribution of significant constipaton).  Has had multiple screenings for Pre-X. No HA, vision changes, RUQ pain.  Mood stable. Tolerating SSRI. Wil check urine for protein. Will follow up in 1 week for OB visit.  Orders: UA Glucose/Protein-[facility] (81002)  Complete Medication List: 1)  Prenatal Vitamins 0.8 Mg Tabs (Prenatal multivit-min-fe-fa) .Marland Kitchen.. 1 tab by mouth daily; dispense generic 2)  Amoxicillin 500 Mg Caps (Amoxicillin) .Marland Kitchen.. 1 by mouth three times a day x 10 days 3)  Fluoxetine Hcl 20 Mg Caps (Fluoxetine hcl) .... Take one every day for your mood. 4)  Tylenol With Codeine #3 300-30 Mg Tabs (Acetaminophen-codeine) .... Take 1-2 every 8 hours for pain 5)  Zofran Odt 4 Mg Tbdp (Ondansetron) .... Take 1-2 tabs evry 6-8 hours for nausea 6)  Diflucan 150 Mg Tabs  (Fluconazole) .... Take 1 tab by mouth x1 7)  Miralax Powd (Polyethylene glycol 3350) .Marland KitchenMarland KitchenMarland Kitchen 17 grams in 8 ozs of water/juice twice daily  quantity: qs  Patient Instructions: 1)  It was good to see you today 2)  The baby is doing well 3)  The zofran see to be helping alot with your nausea 4)  If you develop any severe abominal pain, vaginal bleeding, or decreased fetal movement please give [place] a call or go to [facility] 5)  Otherwise, come back to see me in 1 week 6)  God Bless,  7)  Doree Albee MD    Orders Added: 1)  UA Glucose/Protein-[facility] [81002]     OB Initial Intake Information    Positive HCG by: At [facility]    Race: Black    Marital status: Single    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     Number of children at home: 1  FOB Information    Husband/Father of baby: Merilyn Baba     FOB occupation General Labor     Phone: [phone]  Menstrual History    LMP (date): 04/17/2009    LMP - Character: light    Menarche: 12 years    Menses interval: 25 days    Menstrual flow 5 days    On BCP's at conception: no  Flowsheet View for Follow-up Visit    Estimated weeks of       gestation:     35 1/7    Weight:     226    Blood pressure:   130 / 82    Urine Protein:     negative    Urine Glucose:   negative    Headache:     +    Nausea/vomiting:   nausea    Edema:     0    Vaginal bleeding:   no    Vaginal discharge:   no    Fundal height:      37.5    FHR:       140s    Fetal activity:     yes    Labor symptoms:   few ctx    Next visit:     1 wk    Resident:     SN    Comment:     nausea much improved s/p multiple BMs   Flowsheet View for Follow-up Visit    Estimated weeks of       gestation:     35 1/7    Weight:     226    Blood pressure:   130 / 82    Urine protein:       negative    Urine glucose:    negative    Hx headache?     +    Nausea/vomiting?   nausea    Edema?     0    Bleeding?     no     Leakage/discharge?   no    Fetal activity:       yes    Labor symptoms?   few ctx    Fundal height:      37.5    FHR:       140s    Comment:     nausea much improved s/p multiple BMs    Next visit:     1 wk    Resident:     SN  Laboratory Results   Urine Tests  Date/Time Received: January 15, 2010 2:20  PM  Date/Time Reported: January 15, 2010 2:51 PM   Routine Urinalysis   Glucose: negative   (Normal Range: Negative) Protein: negative   (Normal Range: Negative)    Comments: ...............test performed by......Marland KitchenBonnie A. [place], MLS (ASCP)cm     Appended Document: ob visit/eo    Clinical Lists Changes  Orders: Added new Test order of [facility]- Est Level  3 ([address]) - Signed

## 2010-03-13 NOTE — Letter (Signed)
Summary: Generic Letter  [facility]  [address]   [place], [place] [address]   Phone: [phone]  Fax: [phone]    12/06/2009  Catherine Simmons [address] [place], [place]  [address]  Dear Ms. [name],    This letter is to let you know that your labs, done yesterday during your visit, were all normal.  Please call with any questions.     Sincerely,   Paula Compton MD  Appended Document: Generic Letter mailed.

## 2010-03-13 NOTE — Letter (Signed)
Summary: Handout Printed  Printed Handout:  - Prenatal-Flowsheet-CCC 

## 2010-03-13 NOTE — Letter (Signed)
Summary: Generic Letter  [facility]     [place], [place]    Phone:   Fax:     01/02/2010  [place] [address] [place], [place]  [address]  Dear Ms. Catherine Simmons,   I am pleased to inform you that the results of your gonorrhea, chlamydia, HIV, and syphilis tests have all come back and are negative. Please let us know if there is anything else that you need.     Sincerely,   Demetria Pore MD  Appended Document: Generic Letter mailed
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
## 2010-03-13 NOTE — Assessment & Plan Note (Signed)
Summary: bp ck,tcb  Nurse Visit   Vital Signs:  Patient profile:   25 year old female Pulse rate:   82 / minute BP sitting:   112 / 80  (left arm)  Vitals Entered By: Theresia Lo RN (September 12, 2009 4:36 PM) will return Monday 09/16/2009 for BP check. Theresia Lo RN  September 12, 2009 4:37 PM   Allergies: No Known Drug Allergies  Orders Added: 1)  No Charge Patient Arrived (NCPA0) [NCPA0]

## 2010-03-13 NOTE — Progress Notes (Signed)
Summary: Positive Gonorrhea, Positive Chlamydia  Phone Note Outgoing Call Call back at [facility] Phone [phone]   Call placed by: Romero Belling MD,  May 09, 2009 5:08 PM Call placed to: Patient Summary of Call: Called to report positive Gonorrhea and Chlamydia.  Listed phone is not in service.

## 2010-03-13 NOTE — Letter (Signed)
Summary: Handout Printed  Printed Handout:  - Pregnancy

## 2010-03-13 NOTE — Assessment & Plan Note (Signed)
Summary: exposed to gonorrhea/[place]   Vital Signs:  Patient profile:   25 year old female Height:      62 inches Weight:      200 pounds BMI:     36.71 BSA:     1.91 Temp:     97.9 degrees F Pulse rate:   82 / minute BP sitting:   148 / 97  Vitals Entered By: Jone Baseman CMA (May 08, 2009 8:41 AM) CC: exposed to gonorrhea Is Patient Diabetic? No Pain Assessment Patient in pain? no        Primary Care Provider:  Asher Muir MD  CC:  exposed to gonorrhea.  History of Present Illness: GONORRHEA EXPOSURE.  2 sexual partners past 3 months.  No condom use.  One partner told her he had gonorrhea, she has informed the other partner.  Denies abdominal pain, vaginal discharge, fever.  Has had gonorrhea in the past, but it has been several years.   AMENORRHEA.  Is 1 week overdue on menses--no birth control.  Periods are regular, lasting 5 days every 26 days.  Is never late on period.  Habits & Providers  Alcohol-Tobacco-Diet     Tobacco Status: current     Tobacco Counseling: to quit use of tobacco products     Cigarette Packs/Day: <0.25  Allergies (verified): No Known Drug Allergies  Social History: Packs/Day:  <0.25  Physical Exam  General:  Well-developed,well-nourished,in no acute distress; alert,appropriate and cooperative throughout examination Genitalia:  Normal introitus for age, no external lesions, no vaginal discharge, mucosa pink and moist, no vaginal or cervical lesions, no vaginal atrophy, no friaility or hemorrhage, normal uterus size and position, no adnexal masses or tenderness   Impression & Recommendations:  Problem # 1:  R/O GONOCOCCAL CERVICITIS (ZOX-096.04) Assessment New Following labs drawn.  Treated presumptively with Ceftriaxone 250 mg IM x1 and Azithromycin 1 gram by mouth x1.  Wet prep essentially wnl (few clue cells, but clinically no discharge and no c/o discharge). Orders: [facility]- Est  Level 4 (99214) HIV-[facility]  ([id number]) GC/Chlamydia-[facility] (87591/87491) RPR-[facility] [phone]) Wet Prep- [facility] (86578)  Problem # 2:  AMENORRHEA (ICD-626.0) Assessment: New UPT today -- negative. Orders: [facility]- Est  Level 4 (99214) U Preg-[facility] (81025)  Complete Medication List: 1)  Ibuprofen 800 Mg Tabs (Ibuprofen) .Marland Kitchen.. 1 tab by mouth three times a day as needed pain 2)  Celexa 20 Mg Tabs (Citalopram hydrobromide) .Marland Kitchen.. 1 tablet by mouth daily  Patient Instructions: 1)  I will let you know the results of the labs I drew today. 2)  Use condoms every time.  Laboratory Results   Urine Tests  Date/Time Received: May 08, 2009 9:07 AM  Date/Time Reported: May 08, 2009 9:23 AM     Urine HCG: negative Comments: ...........test performed by...........Marland KitchenTerese Door, CMA  Date/Time Received: May 08, 2009 9:07 AM  Date/Time Reported: May 08, 2009 9:24 AM   [facility] Source: vaginal WBC/hpf: 5-10 Bacteria/hpf: 3+ Clue cells/hpf: few Yeast/hpf: none Trichomonas/hpf: none Comments: ...........test performed by...........Marland KitchenTerese Door, CMA    Appended Document: exposed to gonorrhea/[place]   Medication Administration  Injection # 1:    Medication: Rocephin  250mg     Diagnosis: CONTACT OR EXPOSURE TO OTHER VIRAL DISEASES (ICD-V01.79)    Route: IM    Site: RUOQ gluteus    Exp Date: 08/10/2011    Lot #: [id number]    Mfr: Sandoz    Patient tolerated injection without complications    Given by: Garen Grams LPN (May 08, 2009 10:34 AM)  Medication # 1:    Medication: Azithromycin oral    Diagnosis: CONTACT OR EXPOSURE TO OTHER VIRAL DISEASES (ICD-V01.79)    Dose: 1 gram    Route: po    Exp Date: 01/09/2010    Lot #: [id number]    Mfr: Pfizer    Patient tolerated medication without complications    Given by: Garen Grams LPN (May 08, 2009 10:35 AM)  Orders Added: 1)  Rocephin  250mg  [[id number]] 2)  Azithromycin oral [Q0144]   Complete Medication List: 1)  Ibuprofen 800 Mg Tabs  (Ibuprofen) .Marland Kitchen.. 1 tab by mouth three times a day as needed pain 2)  Celexa 20 Mg Tabs (Citalopram hydrobromide) .Marland Kitchen.. 1 tablet by mouth daily  Other Orders: Rocephin  250mg  (W1191) Azithromycin oral (Y7829)

## 2010-03-13 NOTE — Assessment & Plan Note (Signed)
Summary: OB/KH   Vital Signs:  Patient profile:   25 year old female Height:      62 inches Weight:      227 pounds Pulse rate:   103 / minute BP sitting:   112 / 77  (right arm) Cuff size:   large  Vitals Entered By: Tessie Fass CMA (December 24, 2009 1:49 PM) CC: OB Visit Pain Assessment Patient in pain? no        CC:  OB Visit.  Habits & Providers  Alcohol-Tobacco-Diet     Cigarette Packs/Day: n/a  Allergies: 1)  Butorphanol Tartrate (Butorphanol Tartrate)  Physical Exam  General:  alert and overweight-appearing.   Head:  normocephalic and atraumatic.   Eyes:  vision grossly intact.   Ears:  R ear normal and L ear normal.   Nose:  no external deformity.   Mouth:  good dentition.   Neck:  supple and full ROM.   Lungs:  normal respiratory effort.   Heart:  normal rate, regular rhythm, and no murmur.   Abdomen:  fundal height >dates but consistent w/ previous exams, FHTs appropriate Msk:  No deformity or scoliosis noted of thoracic or lumbar spine.   Extremities:  2+ peripheral pulses, trace Le edema    Impression & Recommendations:  Problem # 1:  PREGNANCY (ICD-V22.2) Overall normal fetal development to date-see flowsheet for full details.  Overall stable/no signs/sxs of Pre-X per pt.  Discussed U/S for EFW w/ pt. Case precepted on batphone. Pt currently not candidate for Ob-Gyn referral for c-section.  Weight gain ULN thus far throughout pregnancy. Will follow up next week. Will obtain GC/Chl and GBS at 36 week mark.  BP normal in setting of borderline elevated BP.  Will consider NST/BPP if BPs trend toward >140/90 in upcoming visits (currently asymptomatic).   Orders: Prenatal U/S > 14 weeks - 16109 (Prenatal U/S)  Problem # 2:  BROKEN TOOTH, INFECTED (ICD-873.73) Pending dental visit after clinic appt today. Will followup at next clinic visit.   Complete Medication List: 1)  Prenatal Vitamins 0.8 Mg Tabs (Prenatal multivit-min-fe-fa) .Marland Kitchen.. 1 tab by  mouth daily; dispense generic 2)  Amoxicillin 500 Mg Caps (Amoxicillin) .Marland Kitchen.. 1 by mouth three times a day x 10 days 3)  Vicodin 5-500 Mg Tabs (Hydrocodone-acetaminophen) .Marland Kitchen.. 1 by mouth three times a day as needed pain 4)  Fluoxetine Hcl 20 Mg Caps (Fluoxetine hcl) .... Take one every day for your mood. 5)  Tylenol With Codeine #3 300-30 Mg Tabs (Acetaminophen-codeine) .... Take 1-2 every 8 hours for pain 6)  Zofran 4 Mg Tabs (Ondansetron hcl) .Marland Kitchen.. 1 tab every 6-8 hours as needed for nausea      OB Initial Intake Information    Positive HCG by: At [facility]    Race: Black    Marital status: Single    Occupation: homemaker    Education (last grade completed): 12th grade     Number of children at home: 1  FOB Information    Husband/Father of baby: Merilyn Baba     FOB occupation General Labor     Phone: [phone]  Menstrual History    LMP (date): 04/17/2009    LMP - Character: light    Menarche: 12 years    Menses interval: 25 days    Menstrual flow 5 days    On BCP's at conception: no   Flowsheet View for Follow-up Visit    Estimated weeks of       gestation:  32 0/7    Weight:     227    Blood pressure:   112 / 77    Headache:     No    Nausea/vomiting:   nausea    Edema:     0    Vaginal bleeding:   no    Vaginal discharge:   no    Fundal height:      35.0    FHR:       140s    Fetal activity:     yes    Labor symptoms:   no    Taking prenatal vits?   Y    Smoking:     n/a    Next visit:     1 wk    Resident:     SN    Flowsheet View for Follow-up Visit    Estimated weeks of       gestation:     32 0/7    Weight:     227    Blood pressure:   112 / 77    Hx headache?     No    Nausea/vomiting?   nausea    Edema?     0    Bleeding?     no    Leakage/discharge?   no    Fetal activity:       yes    Labor symptoms?   no    Fundal height:      35.0    FHR:       140s    Taking Vitamins?   Y    Smoking PPD:   n/a    Next visit:     1 wk    Resident:      SN    Appended Document: OB/KH    Clinical Lists Changes  Orders: Added new Test order of [facility]- Est Level  3 (04540) - Signed

## 2010-03-13 NOTE — Progress Notes (Signed)
Summary: test results  Phone Note Call from Patient Call back at Home Phone [phone]   Caller: Patient Summary of Call: wants results of std test Initial call taken by: De Nurse,  September 17, 2009 11:07 AM  Follow-up for Phone Call        LM Follow-up by: Golden Circle RN,  September 17, 2009 11:14 AM  Additional Follow-up for Phone Call Additional follow up Details #1::        pt returned call - she is on her way up here to pick up papers for her daughter Additional Follow-up by: De Nurse,  September 17, 2009 11:21 AM    Additional Follow-up for Phone Call Additional follow up Details #2::    told her labs from 8/2 were both negative Follow-up by: Golden Circle RN,  September 17, 2009 11:46 AM

## 2010-03-13 NOTE — Assessment & Plan Note (Signed)
Summary: std ck,df   Vital Signs:  Patient profile:   25 year old female Height:      62 inches Weight:      228.2 pounds BMI:     41.89 Temp:     98.7 degrees F oral Pulse rate:   86 / minute BP sitting:   132 / 86  (left arm) Cuff size:   large  Vitals Entered By: Garen Grams LPN (January 01, 2010 2:04 PM) CC: STD Check Is Patient Diabetic? No Pain Assessment Patient in pain? no        Primary Provider:  Doree Albee MD  CC:  STD Check.  History of Present Illness: 33.1 week pt p/w concern for STD.  Pt has been having suprepubic pain for the past week.  She thinks she may have been having it at her last visit, but it is different than the right sided pain she described at that time.  It is sharp/stabbing and comes and goes.  It has gotten worse.  She does not think it feels like ctx. No dysuria.  Vagina feels "swollen" and she has been having a white discharge with a "funny" smell, but not fishy.  The d/c started  ~1 week ago, around the same time that the pain started.  It is "snotty" and she has itching.  No bleeding. No fevers/chills. No other systemic symptoms.  She states that she doesn't trust her baby daddy.  She thinks that they last had sex 1 1/2-2 weeks ago.  Good fetal movement, no ctx, no vaginal pressure, no LOF, no bleeding.   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) 7)  Leukorrhea   (ICD-623.5) 8)  Dysuria  (ICD-788.1) 9)  Broken Tooth, Infected  (ICD-873.73) 10)  Unspecified Disorder Teeth&supporting Structures  (ICD-525.9) 11)  Pregnancy  (ICD-V22.2) 12)  Amenorrhea  (ICD-626.0) 13)  R/O Gonococcal Cervicitis  (ICD-098.15) 14)  Smoker  (ICD-305.1) 15)  Obesity  (ICD-278.00) 16)  Screening For Lipoid Disorders  (ICD-V77.91) 17)  Depression  (ICD-311) 18)  Hx of Gonococcal Cervicitis  (ICD-098.15) 19)  Routine Gynecological Examination  (ICD-V72.31) 20)  Contact or Exposure To Other Viral Diseases  (ICD-V01.79) 21)  Screening For Malignant Neoplasm of The Cervix  (ICD-V76.2) 22)  Excessive or Frequent Menstruation  (ICD-626.2) 23)  Hx of Pyelonephritis  (ICD-590.80)  Current Medications (verified): 1)  Prenatal Vitamins 0.8 Mg Tabs (Prenatal Multivit-Min-Fe-Fa) .Marland Kitchen.. 1 Tab By Mouth Daily; Dispense Generic 2)  Amoxicillin 500 Mg Caps (Amoxicillin) .Marland Kitchen.. 1 By Mouth Three Times A Day X 10 Days 3)  Fluoxetine Hcl 20 Mg Caps (Fluoxetine Hcl) .... Take One Every Day For Your Mood. 4)  Tylenol With Codeine #3 300-30 Mg Tabs (Acetaminophen-Codeine) .... Take 1-2 Every 8 Hours For Pain 5)  Zofran 4 Mg Tabs (Ondansetron Hcl) .Marland Kitchen.. 1 Tab Every 6-8 Hours As Needed For  Nausea  Allergies (verified): 1)  Butorphanol Tartrate (Butorphanol Tartrate)  Physical Exam  General:  alert, well-developed, well-nourished, and well-hydrated.   Abdomen:  Gravid, no TTP. Genitalia:  no external lesions, labial erythema, thick, white vaginal discharge.  no external lesions, mucosa pink and moist, labial erythema, and vaginal discharge. uterus gravid. no cervical motion tenderness.     Impression & Recommendations:  Problem # 1:  UNSPECIFIED VAGINITIS AND VULVOVAGINITIS (ICD-616.10) Pt's symptoms are consistent with candidal infection.  Rare yeast seen on microscopy but will treat since pt is pregnant. Diflucan 150mg  by mouth x1.  Neg for BV. UA neg for infection.  [facility] GC/Chlamydia-[facility] (87591/87491) [facility]- Est Level  3 (98119)  Problem # 2:  CONTACT OR EXPOSURE TO OTHER VIRAL DISEASES (ICD-V01.79) GC/chlamydia done, RPR and HIV drawn. Will send pt letter w/ results.  Orders: Wet Prep- [facility] ([id number]) GC/Chlamydia-[facility] (87591/87491) [facility]- Est Level  3 (95621)  Complete Medication List: 1)  Prenatal Vitamins 0.8 Mg Tabs (Prenatal multivit-min-fe-fa) .Marland Kitchen.. 1 tab by mouth daily; dispense generic 2)  Amoxicillin 500 Mg Caps (Amoxicillin) .Marland Kitchen.. 1 by mouth three times a day x 10 days 3)  Fluoxetine Hcl 20 Mg Caps (Fluoxetine hcl) .... Take one every day for your mood. 4)  Tylenol With Codeine #3 300-30 Mg Tabs (Acetaminophen-codeine) .... Take 1-2 every 8 hours for pain 5)  Zofran 4 Mg Tabs (Ondansetron hcl) .Marland Kitchen.. 1 tab every 6-8 hours as needed for nausea 6)  Diflucan 150 Mg Tabs (Fluconazole) .... Take 1 tab by mouth x1  Other Orders: Urinalysis-[facility] (00000) HIV-[facility] ([id number]) RPR-[facility] ([id number]) Prescriptions: DIFLUCAN 150 MG TABS (FLUCONAZOLE) Take 1 tab by mouth x1  #1 x 0   Entered and Authorized by:   Demetria Pore MD   Signed by:   Demetria Pore MD on 01/01/2010   Method used:   Electronically to        [facility] (retail)       [address]       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     Number of children at home: 1  FOB Information    Husband/Father of baby: Merilyn Baba     FOB occupation General Labor     Phone: [phone]  Menstrual History    LMP (date):  04/17/2009    LMP - Character: light    Menarche: 12 years    Menses interval: 25 days    Menstrual flow 5 days    On BCP's at conception: no   Flowsheet View for Follow-up Visit    Estimated weeks of       gestation:     33 1/7    Weight:     228.2    Blood pressure:   132 / 86    Urine Protein:     negative    Urine  Glucose:   negative    Urine Nitrite:     negative    Smoking:     n/a   Laboratory Results   Urine Tests  Date/Time Received: January 01, 2010 2:34 PM  Date/Time Reported: January 01, 2010 3:14 PM   Routine Urinalysis   Color: yellow Appearance: Clear Glucose: negative   (Normal Range: Negative) Bilirubin: negative   (Normal Range: Negative) Ketone: small (15)   (Normal Range: Negative) Spec. Gravity: 1.020   (Normal Range: 1.003-1.035) Blood: negative   (Normal Range: Negative) pH: 6.5   (Normal Range: 5.0-8.0) Protein: negative   (Normal Range: Negative) Urobilinogen: 0.2   (Normal Range: 0-1) Nitrite: negative   (Normal Range: Negative) Leukocyte Esterace: negative   (Normal Range: Negative)    Comments: .........Marland Kitchenbiochemical negative; microscopic not indicated ...............test performed by......Marland KitchenBonnie A. [place], MLS (ASCP)cm  Date/Time Received: January 01, 2010 2:34 PM  Date/Time Reported: January 01, 2010 3:15 PM   [employer] Source: vag WBC/hpf: 10-20 Bacteria/hpf: 2+  Rods Clue cells/hpf: none  Negative whiff Yeast/hpf: rare Trichomonas/hpf: none Comments: ...............test performed by......Marland KitchenBonnie A. [place], MLS (ASCP)cm

## 2010-03-13 NOTE — Assessment & Plan Note (Signed)
Summary: OB visit: 23 6/7: Mood/? Pre-X   Vital Signs:  Patient profile:   25 year old female Height:      62 inches Weight:      227 pounds Pulse rate:   90 / minute BP sitting:   133 / 79  (left arm) Cuff size:   large  Vitals Entered By: Tessie Fass CMA (October 28, 2009 3:59 PM) CC: OB Visit   CC:  OB Visit.  Habits & Providers  Alcohol-Tobacco-Diet     Cigarette Packs/Day: n/a  Allergies: No Known Drug Allergies  Physical Exam  General:  alert and well-developed.   Head:  normocephalic and atraumatic.   Eyes:  vision grossly intact.   Mouth:  good dentition.   Neck:  supple and full ROM, no JVD Lungs:  CTAB, no wheezes, rales, rhoncii Heart:  RRR, no rubs, gallops, murmurs Abdomen:  minimal + tenderness to palpation in RUQ; gravid abdomen w/ size greater than dates but consistent; fetal heart tones appropriate Extremities:  2+ peripheral pulses, 1+ edema in LEs bilaterally  Neurologic:  alert & oriented X3, cranial nerves II-XII intact, and strength normal in all extremities, no hyper/hyporeflexia   Impression & Recommendations:  Problem # 1:  PREGNANCY (ICD-V22.2) Improved mood secondary to decreased interaction w/ FOB. No longer using phone/waiting on phone number to be changed.  Overall normal fetal development to date-see flowsheet for full details.  Pre-X sxs: Pt reports + HA, RUQ pain, visual floaters, increased swelling in hands and feet. No CP, minimal SOB.  Noted 7 lb weight gain since last visit. Repeat manual BP 138/88. Case precepted w/ Dr. Leveda Anna and Dr. Macon Large on bat phone. No current need for acute intervention. Dx less likely given gestational age. Will obtain Pre-X labs as well as 24 hour urine. Pt instructed extensively that if sxs worsen at all to go to [facility] ED for evaluation. Pt agreeable to plan. Pt otherwise will followup in OB clinic in 2 weeks. Will also obtain 1 hr gtt given followup at 26 week mark.  Orders: Comp Met-[facility]  ([id number]) CBC-[facility] (28413) UA Glucose/Protein-[facility] (81002) [facility]- Est Level  3 (99213)Future Orders: 24hr. Urine TP- [facility] [id number]) ... 10/28/2010  Complete Medication List: 1)  Prenatal Vitamins 0.8 Mg Tabs (Prenatal multivit-min-fe-fa) .Marland Kitchen.. 1 tab by mouth daily; dispense generic 2)  Amoxicillin 500 Mg Caps (Amoxicillin) .Marland Kitchen.. 1 by mouth three times a day x 10 days 3)  Vicodin 5-500 Mg Tabs (Hydrocodone-acetaminophen) .Marland Kitchen.. 1 by mouth three times a day as needed pain  Patient Instructions: 1)  It was good to see you today 2)  If you have any worsening in headache, visual symptoms, abdominal pain, or any other concerns [facility] 3)  Otherwise, set up an appointment with OB clinic in 2 weeks 4)  God Bless,  5)  Doree Albee MD   OB Initial Intake Information    Positive HCG by: At [facility]    Race: Black    Marital status: Single    Occupation: homemaker    Education (last grade completed): 12th grade     Number of children at home: 1  FOB Information    Husband/Father of baby: Merilyn Baba     FOB occupation General Labor     Phone: [phone]  Menstrual History    LMP (date): 04/17/2009    LMP - Character: light    Menarche: 12 years    Menses interval: 25 days  Menstrual flow 5 days    On BCP's at conception: no   Flowsheet View for Follow-up Visit    Estimated weeks of       gestation:     23 6/7    Weight:     227    Blood pressure:   133 / 79    Urine Protein:     negative    Urine Glucose:   negative    Headache:     +    Nausea/vomiting:   No    Edema:     1+LE    Vaginal bleeding:   no    Vaginal discharge:   d/c    Fundal height:      25.0    FHR:       140s    Fetal activity:     yes    Labor symptoms:   no    Taking prenatal vits?   Y    Smoking:     n/a    Next visit:     2 wk    Resident:     SN     Preceptor:     Hensel    Laboratory Results   Urine Tests  Date/Time Received: October 28, 2009 4:45 PM  Date/Time Reported: October 28, 2009 5:09 PM   Routine Urinalysis   Glucose: negative   (Normal Range: Negative) Protein: negative   (Normal Range: Negative)    Comments: ...............test performed by......Marland KitchenBonnie A. [place], MLS (ASCP)cm

## 2010-03-13 NOTE — Progress Notes (Signed)
Summary: Triage  Phone Note Call from Patient Call back at [phone]   Caller: Patient Summary of Call: Pt needs to be seen due to her partner telling her that he had something. Initial call taken by: Clydell Hakim,  May 07, 2009 11:51 AM  Follow-up for Phone Call        states he has gonorrhea. she will be here at 8:30 tomorrow to see Dr. Constance Goltz Follow-up by: Golden Circle RN,  May 07, 2009 12:14 PM

## 2010-04-08 ENCOUNTER — Other Ambulatory Visit (HOSPITAL_COMMUNITY)
Admission: RE | Admit: 2010-04-08 | Discharge: 2010-04-08 | Disposition: A | Discharge status: Home / Self Care | Payer: Medicaid Other | Source: Ambulatory Visit | Attending: Family Medicine | Admitting: Family Medicine

## 2010-04-08 DIAGNOSIS — Z01419 Encounter for gynecological examination (general) (routine) without abnormal findings: Secondary | ICD-10-CM | POA: Insufficient documentation

## 2010-04-09 ENCOUNTER — Encounter: Payer: Self-pay | Admitting: Family Medicine

## 2010-04-09 ENCOUNTER — Other Ambulatory Visit: Payer: Self-pay | Admitting: Family Medicine

## 2010-04-09 ENCOUNTER — Ambulatory Visit (INDEPENDENT_AMBULATORY_CARE_PROVIDER_SITE_OTHER): Payer: Medicaid Other | Admitting: Family Medicine

## 2010-04-09 DIAGNOSIS — N898 Other specified noninflammatory disorders of vagina: Secondary | ICD-10-CM

## 2010-04-09 DIAGNOSIS — F329 Major depressive disorder, single episode, unspecified: Secondary | ICD-10-CM

## 2010-04-09 DIAGNOSIS — K089 Disorder of teeth and supporting structures, unspecified: Secondary | ICD-10-CM

## 2010-04-09 DIAGNOSIS — I10 Essential (primary) hypertension: Secondary | ICD-10-CM

## 2010-04-09 DIAGNOSIS — K044 Acute apical periodontitis of pulpal origin: Secondary | ICD-10-CM

## 2010-04-09 DIAGNOSIS — K0889 Other specified disorders of teeth and supporting structures: Secondary | ICD-10-CM

## 2010-04-09 DIAGNOSIS — N920 Excessive and frequent menstruation with regular cycle: Secondary | ICD-10-CM

## 2010-04-09 DIAGNOSIS — K047 Periapical abscess without sinus: Secondary | ICD-10-CM

## 2010-04-09 LAB — POCT WET PREP (WET MOUNT): Yeast Wet Prep HPF POC: NEGATIVE

## 2010-04-09 LAB — CONVERTED CEMR LAB: GC Probe Amp, Genital: NEGATIVE

## 2010-04-09 MED ORDER — AMLODIPINE BESYLATE 10 MG PO TABS: 10.0000 mg | ORAL_TABLET | Freq: Every day | ORAL | Status: DC

## 2010-04-09 MED ORDER — FLUOXETINE HCL 20 MG PO CAPS: 20.0000 mg | ORAL_CAPSULE | Freq: Every day | ORAL | Status: DC

## 2010-04-09 MED ORDER — HYDROCODONE-ACETAMINOPHEN 5-500 MG PO TABS: 1.0000 | ORAL_TABLET | ORAL | Status: DC | PRN

## 2010-04-09 MED ORDER — AMOXICILLIN 500 MG PO CAPS: 500.0000 mg | ORAL_CAPSULE | Freq: Three times a day (TID) | ORAL | Status: DC

## 2010-04-09 NOTE — Progress Notes (Signed)
  Subjective:     Catherine Simmons is a 25 y.o. female who presents for a postpartum visit. She is 7 weeks postpartum following a spontaneous vaginal delivery. I have fully reviewed the prenatal and intrapartum course. The delivery was at >39 gestational weeks. Outcome: spontaneous vaginal delivery. Anesthesia: epidural. Postpartum course has been complicated by post partum hypertension.  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 Baby is feeding by bottle - infamil. Bleeding changing a thin pad 4 times a day in setting of taking qid bd powders for tooth pain.. Bowel function is normal. Bladder function is normal. Patient is not sexually active. Contraception method is Depo-Provera injections. Postpartum depression screening:PHQ-9 score of 9.   Hypertension: Pt is noted to have developed hypertension in the post partum period with the patient being discharged on HCTZ 25mg  daily. Pt is noted to have multiple episodes of Pre-X rule outs in the prenatal period that were negative. Pt states that she has been compliant with taking the HCTZ daily. Pt denies any HA, vision changes, RUQ pain, CP, dyspnea, LE swelling. Pt has not been checking blood pressures at home.  Review of Systems See HPI   Objective:    BP 150/100  Pulse 77  Temp(Src) 98.5 F (36.9 C) (Oral)  Ht 5\' 2"  (1.575 m)  Wt 221 lb (100.245 kg)  BMI 40.42 kg/m2  General:  alert and cooperative   Breasts:  negative  Lungs: clear to auscultation bilaterally  Heart:  regular rate and rhythm, S1, S2 normal, no murmur, click, rub or gallop  Abdomen: soft, non-tender; bowel sounds normal; no masses,  no organomegaly   Vulva:  normal and visible blood on exterior of vagina   Vagina: normal vagina and minimal discharge   Cervix:  no cervical motion tenderness and no lesions  Corpus: not examined  Adnexa:  normal adnexa and  no mass, fullness, tenderness  Rectal Exam: Normal rectovaginal exam        Assessment:    Otherwise normal  postpartum exam. Pap smear done at today's visit.   Plan:    1. Contraception: Depo-Provera injections  2. Follow up in: 1 month  For followup on hypertension.

## 2010-04-09 NOTE — Patient Instructions (Addendum)
HOME CARE INSTRUCTIONS AFTER YOUR DELIVERY:  Follow instructions and take the medicines given to you.  Only take over-the-counter or prescription medicines for pain, discomfort, or fever as directed by your caregiver.   Do not take aspirin, because it can cause bleeding.  Increase your activities a little bit every day to build up your strength and endurance.  Do not drink alcohol, especially if you are breastfeeding or taking pain medicine.  Take your temperature twice a day and record it.  You may have a small amount of bleeding or spotting for 2 to 4 weeks. This is normal.  Do not use tampons or douche. Use sanitary pads.  Try to have someone stay and help you for a few days when you go home.  Try to rest or take a nap when the baby is sleeping.  If you are breastfeeding, wear a good support bra. If you are not breastfeeding, wear a tight bra, do not stimulate your nipples, and decrease your fluid intake.  Eat a healthy, nutritious diet and continue to take your prenatal vitamins.  Do not drive, do any heavy activities or travel until your caregiver tells you it is OK.  Do not have intercourse until your caregiver gives you permission to do so.  Ask your caregiver when you can begin to exercise and what type of exercises to do.  Call your caregiver if you think you are having a problem from your delivery.  Call your pediatrician if you are having a problem with the baby.  Schedule your postpartum visit and keep it.   SEEK MEDICAL CARE IF:  You have a temperature of 100 F (37.8 C) or higher.  You have increased vaginal bleeding or are passing clots. Save any clots to show your caregiver.  You have bloody urine, or pain when you urinate.  You have a bad smelling vaginal discharge.  You have increasing pain or swelling on your episiotomy (surgically enlarged opening).  You develop a severe headache.  You feel depressed.  The episiotomy is separating.  You  become dizzy or lightheaded.  You develop a rash.  You have a reaction or problems with your medicine.  You have pain, redness, and/or swelling at the intravenous site.   SEEK IMMEDIATE MEDICAL CARE IF:  You have chest pain.  You develop shortness of breath.  You pass out.  You develop pain, with or without swelling or redness in your leg.  You develop heavy vaginal bleeding, with or without blood clots.  You develop stomach pain.  You develop a bad smelling vaginal discharge.   MAKE SURE YOU:   Understand these instructions.   Will watch your condition.  Will get help right away if you are not doing well or get worse.   Document Released: 11/23/2006  Document Re-Released: 01/14/2009 [facility] Patient Information 2011 [place], [place].Hypertension (High Blood Pressure)   As your heart beats, it forces blood through your arteries. This force is your blood pressure. If the pressure is too high, it is called hypertension (HTN) or high blood pressure. HTN is dangerous because you may have it and not know it. High blood pressure may mean that your heart has to work harder to pump blood. Your arteries may be narrow or stiff. The extra work puts you at risk for heart disease, stroke, and other problems.    Blood pressure consists of two numbers, a higher number over a lower, 110/72, for example. It is stated as "110 over 72." The ideal  is below 120 for the top number (systolic) and under 80 for the bottom (diastolic).   You should pay close attention to your blood pressure if you have certain conditions such as:  Heart failure.  Prior heart attack.   Diabetes  Chronic kidney disease.   Prior stroke.  Multiple risk factors for heart disease.    To see if you have HTN, your blood pressure should be measured while you are seated with your arm held at the level of the heart. It should be measured at least twice. A one-time elevated blood pressure reading (especially in the  Emergency Department) does not mean that you need treatment. There may be conditions in which the blood pressure is different between your right and left arms. It is important to see your caregiver soon for a recheck.   Most people have essential hypertension which means that there is not a specific cause. This type of high blood pressure may be lowered by changing lifestyle factors such as:  Stress.  Smoking.  Lack of exercise.  Excessive weight.  Drug/tobacco/alcohol use.   Eating less salt.     Most people do not have symptoms from high blood pressure until it has caused damage to the body. Effective treatment can often prevent, delay or reduce that damage.   TREATMENT: Treatment for high blood pressure, when a cause has been identified, is directed at the cause. There are a large number of medications to treat HTN. These fall into several categories, and your caregiver will help you select the medicines that are best for you. Medications may have side effects. You should review side effects with your caregiver.   If your blood pressure stays high after you have made lifestyle changes or started on medicines,   Your medication(s) may need to be changed.  Other problems may need to be addressed.  Be certain you understand your prescriptions, and know how and when to take your medicine.  Be sure to follow up with your caregiver within the time frame advised (usually within two weeks) to have your blood pressure rechecked and to review your medications.  If you are taking more than one medicine to lower your blood pressure, make sure you know how and at what times they should be taken. Taking two medicines at the same time can result in blood pressure that is too low.   SEEK IMMEDIATE MEDICAL CARE IF YOU DEVELOP:  A severe headache, blurred or changing vision, or confusion.  Unusual weakness or numbness, or a faint feeling.  Severe chest or abdominal pain, vomiting, or breathing  problems.   It is your responsibility to follow up with your caregiver in order to determine if your elevated blood pressure should be treated.     MAKE SURE YOU:   Understand these instructions.   Will watch your condition.  Will get help right away if you are not doing well or get worse.   Document Released: 07/09/2005  Document Re-Released: 04/24/2008 [facility] Patient Information 2011 [place], [place].Dental Pain (Tooth Ache) A tooth ache may be caused by cavities (tooth decay). Cavities expose the nerve of the tooth to air and hot or cold temperatures. It may come from an infection or abscess (also called a boil or furuncle) around your tooth. It is also often caused by dental caries (tooth decay). This causes the pain you are having. DIAGNOSIS  Your caregiver can diagnose this problem by exam. TREATMENT  If caused by an infection, it may be treated  with medications which kill germs (antibiotics) and pain medications as prescribed by your caregiver. Take medications as directed.   Only take over-the-counter or prescription medicines for pain, discomfort, or fever as directed by your caregiver.   Whether the tooth ache today is caused by infection or dental disease, you should see your dentist as soon as possible for further care.  SEEK MEDICAL CARE: The exam and treatment you received today has been provided on an emergency basis only. This is not a substitute for complete medical or dental care. If your problem worsens or new problems (symptoms) appear, and you are unable to meet with your dentist, call or return to this location. SEEK IMMEDIATE MEDICAL CARE IF:  You develop a fever over .   You develop redness and swelling of your face, jaw, or neck.   You are unable to open your mouth.   You have severe pain uncontrolled by pain medicine.  MAKE SURE YOU:   Understand these instructions.   Will watch your condition.   Will get help right away if you are not doing well or  get worse.  Document Released: 01/26/2005 Document Re-Released: 01/09/2008 [facility] Patient Information 2011 [place], [place]. Place postpartum visit patient instructions here.

## 2010-04-10 DIAGNOSIS — I10 Essential (primary) hypertension: Secondary | ICD-10-CM | POA: Insufficient documentation

## 2010-04-10 LAB — GC/CHLAMYDIA PROBE AMP, GENITAL
Chlamydia, DNA Probe: NEGATIVE
GC Probe Amp, Genital: NEGATIVE

## 2010-04-10 NOTE — Assessment & Plan Note (Addendum)
Hypertension noted in the post partum period. Pt with multiple RFs given obesity, poor diet and exercise. Will check hypertensive labs today. Will add on norvasc to HCTZ.Also broached issue of bc powder avoidance as this is likely major precipitant in hypertensive disease. Discussed hypertensive red flags for return. Will follow up in 1 month.

## 2010-04-10 NOTE — Assessment & Plan Note (Signed)
PAP, wet prep, GC/Chl performed today. Otherwise normal postpartum exam. Will follow up with the patient about results via letter and at next clinical visit.

## 2010-04-10 NOTE — Assessment & Plan Note (Signed)
Pt does report some improvement in mood with prozac. PHQ score at 9 which is somewhat borderline. Will restart prozac given clinical improvement with medication in the past. Will follow up on mood in 1 month.

## 2010-04-10 NOTE — Assessment & Plan Note (Signed)
Excessive post partum bleeding likely secondary to high ASA intake in setting of chronic BC powder use. Stressed discontinuation of product. Will follow up in 1 month.

## 2010-04-10 NOTE — Assessment & Plan Note (Signed)
Pt with chronic teeth issues. L post lower teeth look somewhat infected. Pt pending for medicaid set up in next 4-6 weeks for removal of teeth. Will rx amoxicillin for abx treatment as well as vicodin for prn pain. Will followup in 1 month.

## 2010-04-11 ENCOUNTER — Telehealth: Payer: Self-pay | Admitting: Sports Medicine

## 2010-04-11 NOTE — Telephone Encounter (Signed)
Pt calling for refill on Vicodin, advised emergencies only on this line, she would need to wait until Monday and call office.

## 2010-04-21 LAB — COMPREHENSIVE METABOLIC PANEL
ALT: 10 U/L (ref 0–35)
ALT: 9 U/L (ref 0–35)
ALT: 13 U/L (ref 0–35)
AST: 19 U/L (ref 0–37)
AST: 17 U/L (ref 0–37)
Albumin: 2.6 g/dL — ABNORMAL LOW (ref 3.5–5.2)
Alkaline Phosphatase: 166 U/L — ABNORMAL HIGH (ref 39–117)
Alkaline Phosphatase: 199 U/L — ABNORMAL HIGH (ref 39–117)
Alkaline Phosphatase: 208 U/L — ABNORMAL HIGH (ref 39–117)
Alkaline Phosphatase: 146 U/L — ABNORMAL HIGH (ref 39–117)
BUN: 4 mg/dL — ABNORMAL LOW (ref 6–23)
BUN: 4 mg/dL — ABNORMAL LOW (ref 6–23)
CO2: 24 mEq/L (ref 19–32)
CO2: 24 mEq/L (ref 19–32)
CO2: 24 mEq/L (ref 19–32)
Chloride: 103 mEq/L (ref 96–112)
Chloride: 106 mEq/L (ref 96–112)
Chloride: 106 mEq/L (ref 96–112)
GFR calc Af Amer: >60 mL/min (ref >60)
GFR calc Af Amer: >60 mL/min (ref >60)
GFR calc non Af Amer: >60 mL/min (ref >60)
GFR calc non Af Amer: >60 mL/min (ref >60)
GFR calc non Af Amer: >60 mL/min (ref >60)
Glucose, Bld: 79 mg/dL (ref 70–99)
Glucose, Bld: 89 mg/dL (ref 70–99)
Potassium: 3.6 mEq/L (ref 3.5–5.1)
Potassium: 3.8 mEq/L (ref 3.5–5.1)
Potassium: 3.8 mEq/L (ref 3.5–5.1)
Potassium: 3.6 mEq/L (ref 3.5–5.1)
Sodium: 135 mEq/L (ref 135–145)
Sodium: 135 mEq/L (ref 135–145)
Sodium: 137 mEq/L (ref 135–145)
Total Bilirubin: 0.5 mg/dL (ref 0.3–1.2)
Total Bilirubin: 0.5 mg/dL (ref 0.3–1.2)
Total Bilirubin: 0.8 mg/dL (ref 0.3–1.2)
Total Bilirubin: 0.1 mg/dL — ABNORMAL LOW (ref 0.3–1.2)
Total Protein: 5.3 g/dL — ABNORMAL LOW (ref 6.0–8.3)
Total Protein: 5.4 g/dL — ABNORMAL LOW (ref 6.0–8.3)

## 2010-04-21 LAB — CBC
HCT: 26.1 % — ABNORMAL LOW (ref 36.0–46.0)
HCT: 27.9 % — ABNORMAL LOW (ref 36.0–46.0)
HCT: 29.3 % — ABNORMAL LOW (ref 36.0–46.0)
Hemoglobin: 9 g/dL — ABNORMAL LOW (ref 12.0–15.0)
Hemoglobin: 9.3 g/dL — ABNORMAL LOW (ref 12.0–15.0)
Hemoglobin: 9.2 g/dL — ABNORMAL LOW (ref 12.0–15.0)
MCH: 27.9 pg (ref 26.0–34.0)
MCV: 76.5 fL — ABNORMAL LOW (ref 78.0–100.0)
MCV: 77.9 fL — ABNORMAL LOW (ref 78.0–100.0)
Platelets: 170 10*3/uL (ref 150–400)
Platelets: 184 10*3/uL (ref 150–400)
RBC: 3.67 MIL/uL — ABNORMAL LOW (ref 3.87–5.11)
RBC: 3.29 MIL/uL — ABNORMAL LOW (ref 3.87–5.11)
RDW: 12.2 % (ref 11.5–15.5)
RDW: 12.3 % (ref 11.5–15.5)
RDW: 12.4 % (ref 11.5–15.5)
RDW: 12.5 % (ref 11.5–15.5)
WBC: 7.1 10*3/uL (ref 4.0–10.5)
WBC: 7.4 10*3/uL (ref 4.0–10.5)
WBC: 7.7 10*3/uL (ref 4.0–10.5)
WBC: 9.3 10*3/uL (ref 4.0–10.5)

## 2010-04-21 LAB — RPR: RPR Ser Ql: NONREACTIVE

## 2010-04-21 LAB — URINALYSIS, DIPSTICK ONLY
Bilirubin Urine: NEGATIVE
Glucose, UA: NEGATIVE mg/dL
Ketones, ur: >80 mg/dL — AB
Leukocytes, UA: NEGATIVE
Nitrite: NEGATIVE
Protein, ur: NEGATIVE mg/dL
pH: 6 (ref 5.0–8.0)

## 2010-04-21 LAB — URINALYSIS, ROUTINE W REFLEX MICROSCOPIC
Bilirubin Urine: NEGATIVE
Bilirubin Urine: NEGATIVE
Glucose, UA: NEGATIVE mg/dL
Glucose, UA: NEGATIVE mg/dL
Glucose, UA: NEGATIVE mg/dL
Ketones, ur: NEGATIVE mg/dL
Ketones, ur: NEGATIVE mg/dL
Protein, ur: NEGATIVE mg/dL
Protein, ur: NEGATIVE mg/dL
pH: 6 (ref 5.0–8.0)
pH: 6 (ref 5.0–8.0)

## 2010-04-21 LAB — RAPID URINE DRUG SCREEN, HOSP PERFORMED
Amphetamines: NOT DETECTED
Barbiturates: NOT DETECTED
Benzodiazepines: NOT DETECTED
Cocaine: NOT DETECTED
Opiates: NOT DETECTED
Tetrahydrocannabinol: POSITIVE — AB

## 2010-04-21 LAB — LACTATE DEHYDROGENASE: LDH: 142 U/L (ref 94–250)

## 2010-04-21 LAB — PROTEIN / CREATININE RATIO, URINE: Protein Creatinine Ratio: 0.12 (ref 0.00–0.15)

## 2010-04-21 LAB — URIC ACID: Uric Acid, Serum: 5.6 mg/dL (ref 2.4–7.0)

## 2010-04-23 LAB — COMPREHENSIVE METABOLIC PANEL
ALT: 13 U/L (ref 0–35)
AST: 16 U/L (ref 0–37)
Albumin: 2.9 g/dL — ABNORMAL LOW (ref 3.5–5.2)
Alkaline Phosphatase: 64 U/L (ref 39–117)
BUN: 4 mg/dL — ABNORMAL LOW (ref 6–23)
Chloride: 106 mEq/L (ref 96–112)
Potassium: 3.4 mEq/L — ABNORMAL LOW (ref 3.5–5.1)
Sodium: 135 mEq/L (ref 135–145)
Total Bilirubin: 0.3 mg/dL (ref 0.3–1.2)

## 2010-04-23 LAB — URINALYSIS, ROUTINE W REFLEX MICROSCOPIC
Bilirubin Urine: NEGATIVE
Glucose, UA: NEGATIVE mg/dL
Hgb urine dipstick: NEGATIVE
Ketones, ur: NEGATIVE mg/dL
Nitrite: NEGATIVE
Protein, ur: NEGATIVE mg/dL
Specific Gravity, Urine: 1.02 (ref 1.005–1.030)
Urobilinogen, UA: 0.2 mg/dL (ref 0.0–1.0)
pH: 5.5 (ref 5.0–8.0)

## 2010-04-23 LAB — CBC
HCT: 30.1 % — ABNORMAL LOW (ref 36.0–46.0)
MCV: 85.4 fL (ref 78.0–100.0)
Platelets: 192 10*3/uL (ref 150–400)
RBC: 3.52 MIL/uL — ABNORMAL LOW (ref 3.87–5.11)
WBC: 9.9 10*3/uL (ref 4.0–10.5)

## 2010-04-23 LAB — AMYLASE: Amylase: 68 U/L (ref 0–105)

## 2010-04-23 LAB — WET PREP, GENITAL: Trich, Wet Prep: NONE SEEN

## 2010-04-23 LAB — GC/CHLAMYDIA PROBE AMP, URINE: GC Probe Amp, Urine: NEGATIVE

## 2010-04-24 LAB — GLUCOSE, CAPILLARY
Glucose-Capillary: 142 mg/dL — ABNORMAL HIGH (ref 70–99)
Glucose-Capillary: 95 mg/dL (ref 70–99)

## 2010-04-27 LAB — POCT I-STAT, CHEM 8
Calcium, Ion: 1.08 mmol/L — ABNORMAL LOW (ref 1.12–1.32)
Creatinine, Ser: 0.6 mg/dL (ref 0.4–1.2)
Glucose, Bld: 84 mg/dL (ref 70–99)
HCT: 30 % — ABNORMAL LOW (ref 36.0–46.0)
Hemoglobin: 10.2 g/dL — ABNORMAL LOW (ref 12.0–15.0)

## 2010-04-27 LAB — URINALYSIS, ROUTINE W REFLEX MICROSCOPIC
Bilirubin Urine: NEGATIVE
Ketones, ur: 15 mg/dL — AB
Nitrite: NEGATIVE
Protein, ur: NEGATIVE mg/dL
Urobilinogen, UA: 0.2 mg/dL (ref 0.0–1.0)

## 2010-04-28 LAB — URINE CULTURE

## 2010-04-28 LAB — DIFFERENTIAL
Basophils Absolute: 0 10*3/uL (ref 0.0–0.1)
Eosinophils Absolute: 0.1 10*3/uL (ref 0.0–0.7)
Eosinophils Relative: 1 % (ref 0–5)
Neutrophils Relative %: 49 % (ref 43–77)

## 2010-04-28 LAB — URINALYSIS, ROUTINE W REFLEX MICROSCOPIC
Bilirubin Urine: NEGATIVE
Hgb urine dipstick: NEGATIVE
Ketones, ur: NEGATIVE mg/dL
Nitrite: NEGATIVE
Specific Gravity, Urine: >1.03 — ABNORMAL HIGH (ref 1.005–1.030)
Urobilinogen, UA: 0.2 mg/dL (ref 0.0–1.0)

## 2010-04-28 LAB — CBC
HCT: 33.6 % — ABNORMAL LOW (ref 36.0–46.0)
MCV: 83.1 fL (ref 78.0–100.0)
Platelets: 188 10*3/uL (ref 150–400)
RDW: 12.9 % (ref 11.5–15.5)
WBC: 6.8 10*3/uL (ref 4.0–10.5)

## 2010-04-28 LAB — WET PREP, GENITAL: Yeast Wet Prep HPF POC: NONE SEEN

## 2010-04-28 LAB — URINE MICROSCOPIC-ADD ON

## 2010-04-28 LAB — GC/CHLAMYDIA PROBE AMP, GENITAL: Chlamydia, DNA Probe: NEGATIVE

## 2010-04-29 LAB — WET PREP, GENITAL: Yeast Wet Prep HPF POC: NONE SEEN

## 2010-04-29 LAB — POCT I-STAT, CHEM 8
BUN: 6 mg/dL (ref 6–23)
Calcium, Ion: 1.22 mmol/L (ref 1.12–1.32)
Chloride: 104 meq/L (ref 96–112)
Creatinine, Ser: 0.6 mg/dL (ref 0.4–1.2)
Glucose, Bld: 76 mg/dL (ref 70–99)
HCT: 36 % (ref 36.0–46.0)
Hemoglobin: 12.2 g/dL (ref 12.0–15.0)
Potassium: 3.8 meq/L (ref 3.5–5.1)
Sodium: 137 meq/L (ref 135–145)
TCO2: 25 mmol/L (ref 0–100)

## 2010-04-29 LAB — CBC
HCT: 32.3 % — ABNORMAL LOW (ref 36.0–46.0)
Hemoglobin: 11.2 g/dL — ABNORMAL LOW (ref 12.0–15.0)
MCHC: 34.7 g/dL (ref 30.0–36.0)
MCV: 82.2 fL (ref 78.0–100.0)
Platelets: 173 10*3/uL (ref 150–400)
Platelets: 186 K/uL (ref 150–400)
RBC: 3.93 MIL/uL (ref 3.87–5.11)
RDW: 12.7 % (ref 11.5–15.5)
WBC: 5.5 10*3/uL (ref 4.0–10.5)
WBC: 5.5 K/uL (ref 4.0–10.5)

## 2010-04-29 LAB — HCG, QUANTITATIVE, PREGNANCY: hCG, Beta Chain, Quant, S: 5271 m[IU]/mL — ABNORMAL HIGH (ref <5)

## 2010-04-29 LAB — URINALYSIS, ROUTINE W REFLEX MICROSCOPIC
Bilirubin Urine: NEGATIVE
Glucose, UA: NEGATIVE mg/dL
Hgb urine dipstick: NEGATIVE
Specific Gravity, Urine: 1.009 (ref 1.005–1.030)
Urobilinogen, UA: 0.2 mg/dL (ref 0.0–1.0)
pH: 6 (ref 5.0–8.0)

## 2010-04-29 LAB — DIFFERENTIAL
Basophils Absolute: 0 10*3/uL (ref 0.0–0.1)
Basophils Relative: 1 % (ref 0–1)
Lymphocytes Relative: 55 % — ABNORMAL HIGH (ref 12–46)
Monocytes Relative: 9 % (ref 3–12)
Neutro Abs: 1.9 10*3/uL (ref 1.7–7.7)
Neutrophils Relative %: 34 % — ABNORMAL LOW (ref 43–77)

## 2010-04-29 LAB — GC/CHLAMYDIA PROBE AMP, GENITAL
Chlamydia, DNA Probe: NEGATIVE
GC Probe Amp, Genital: NEGATIVE

## 2010-04-29 LAB — ABO/RH: ABO/RH(D): A POS

## 2010-04-29 LAB — POCT PREGNANCY, URINE: Preg Test, Ur: POSITIVE

## 2010-05-12 ENCOUNTER — Encounter: Payer: Self-pay | Admitting: Family Medicine

## 2010-05-12 ENCOUNTER — Ambulatory Visit (INDEPENDENT_AMBULATORY_CARE_PROVIDER_SITE_OTHER): Payer: Medicaid Other | Admitting: Family Medicine

## 2010-05-12 ENCOUNTER — Ambulatory Visit (HOSPITAL_COMMUNITY)
Admission: RE | Admit: 2010-05-12 | Discharge: 2010-05-12 | Disposition: A | Discharge status: Home / Self Care | Payer: Medicaid Other | Source: Ambulatory Visit | Attending: Family Medicine | Admitting: Family Medicine

## 2010-05-12 DIAGNOSIS — K8020 Calculus of gallbladder without cholecystitis without obstruction: Secondary | ICD-10-CM | POA: Insufficient documentation

## 2010-05-12 DIAGNOSIS — R109 Unspecified abdominal pain: Secondary | ICD-10-CM | POA: Insufficient documentation

## 2010-05-12 DIAGNOSIS — N730 Acute parametritis and pelvic cellulitis: Secondary | ICD-10-CM

## 2010-05-12 DIAGNOSIS — K5900 Constipation, unspecified: Secondary | ICD-10-CM

## 2010-05-12 LAB — POCT URINALYSIS DIPSTICK
Glucose, UA: NEGATIVE
Leukocytes, UA: NEGATIVE
Nitrite, UA: POSITIVE

## 2010-05-12 LAB — POCT UA - MICROSCOPIC ONLY

## 2010-05-12 IMAGING — US US ABDOMEN COMPLETE
1 series · 14 of 25 positions shown · non-contrast
Comparison: None.

CLINICAL DATA: Pain

COMPLETE ABDOMINAL ULTRASOUND

[Series 1: us abdomen complete · 0.35mm/px · 14 of 70 slices shown]
[im 1/70]
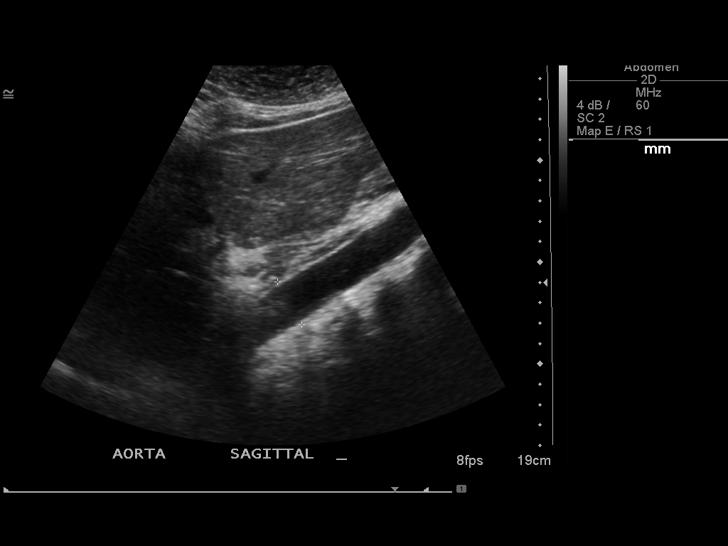
[im 6/70]
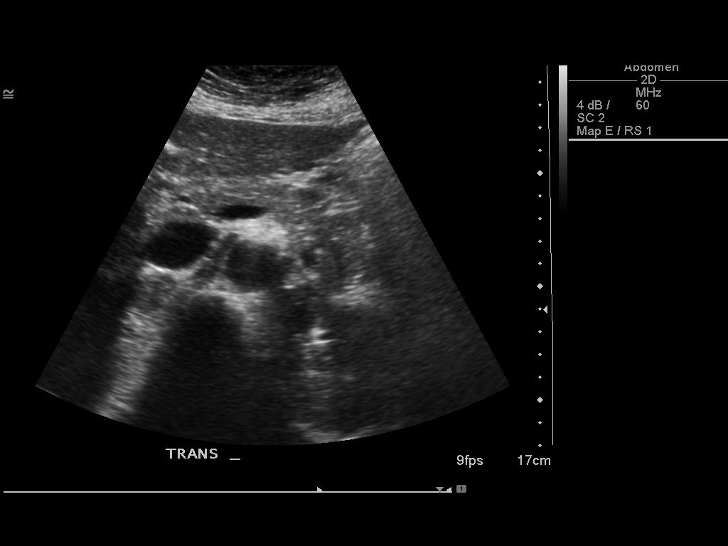
[im 12/70]
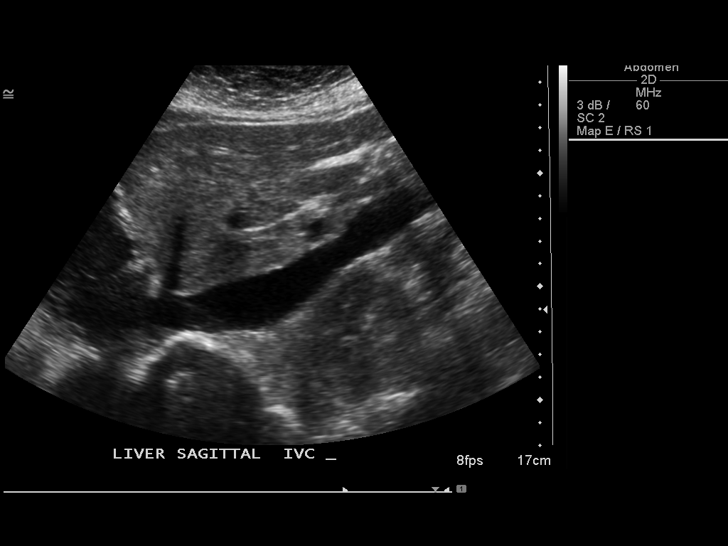
[im 18/70]
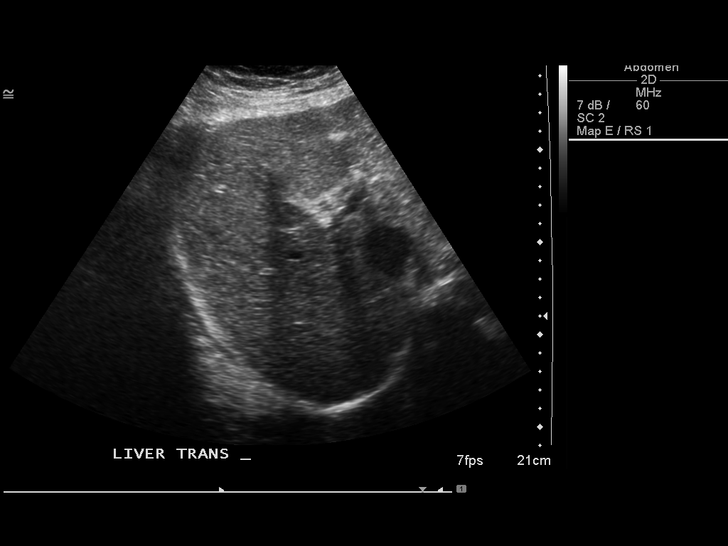
[im 24/70]
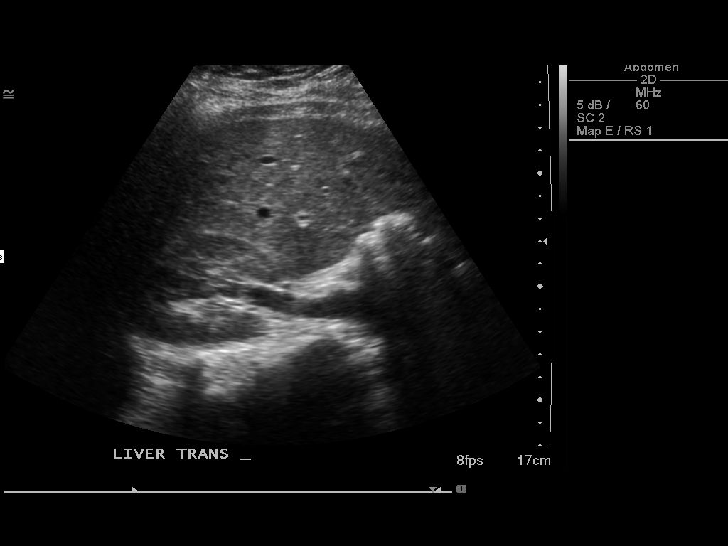
[im 26/70]
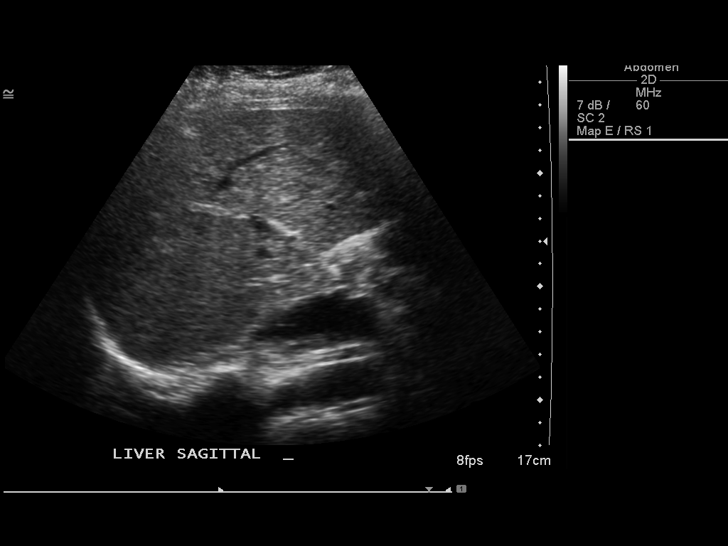
[im 32/70]
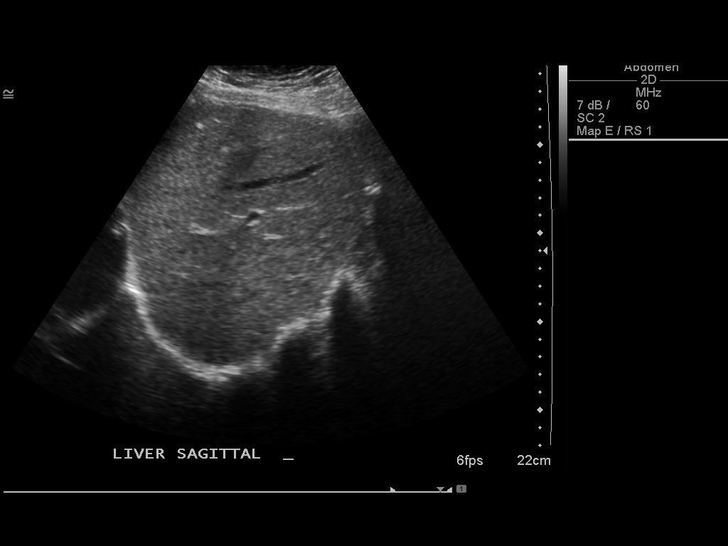
[im 38/70]
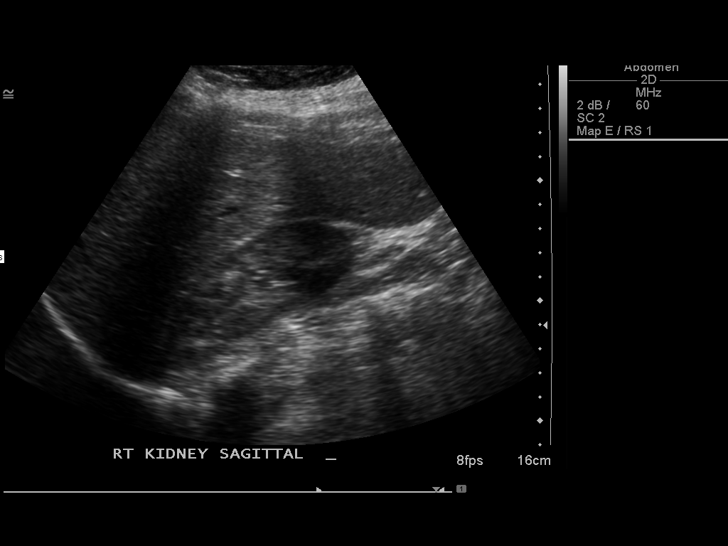
[im 44/70]
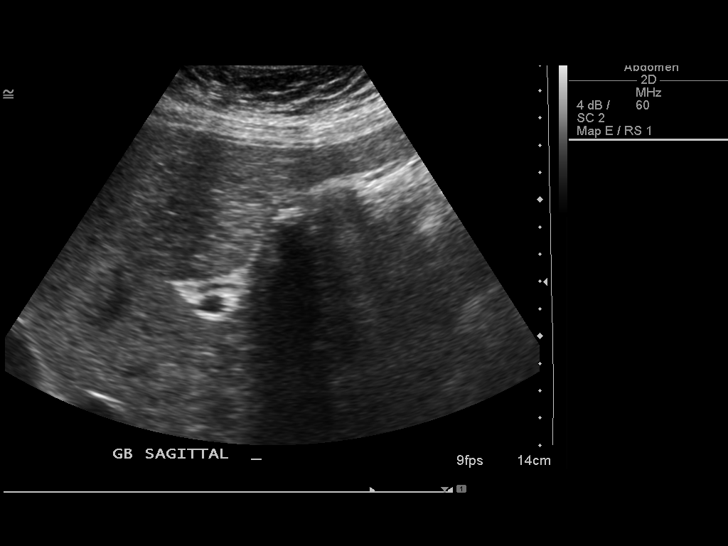
[im 47/70]
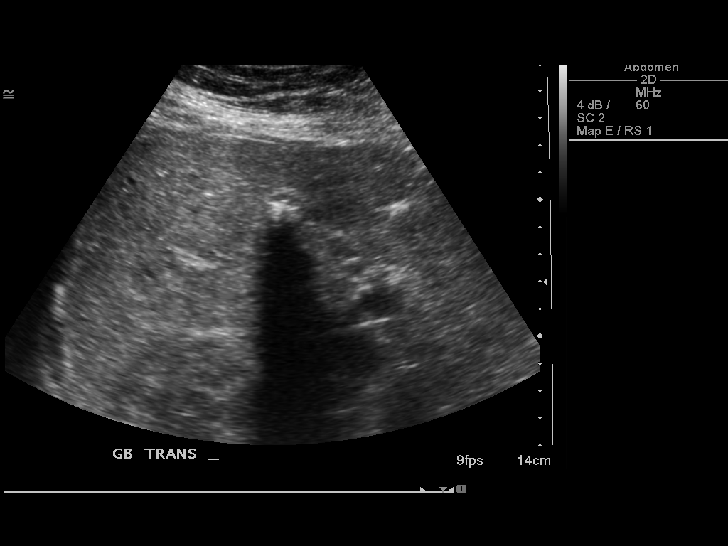
[im 52/70]
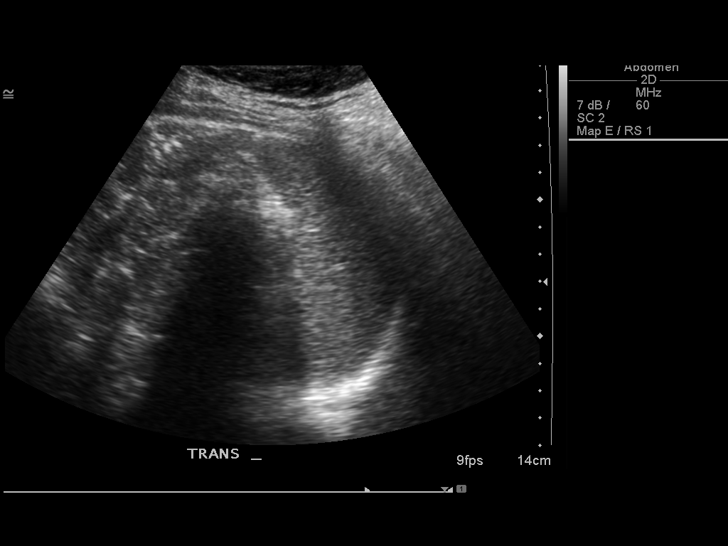
[im 58/70]
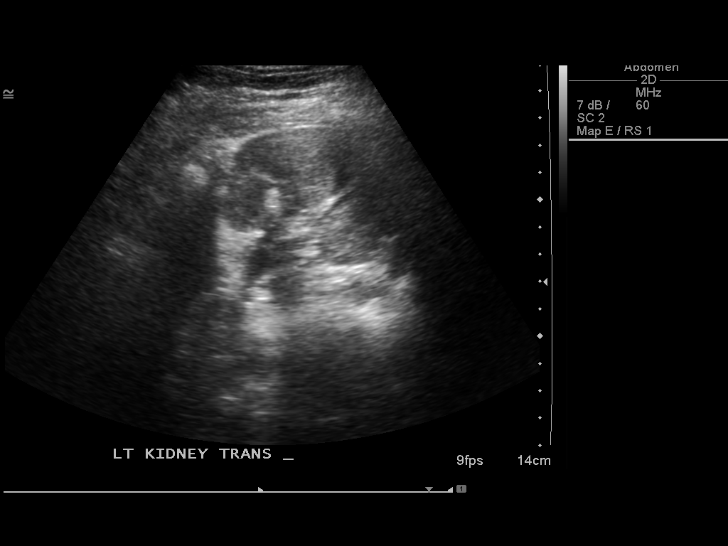
[im 64/70]
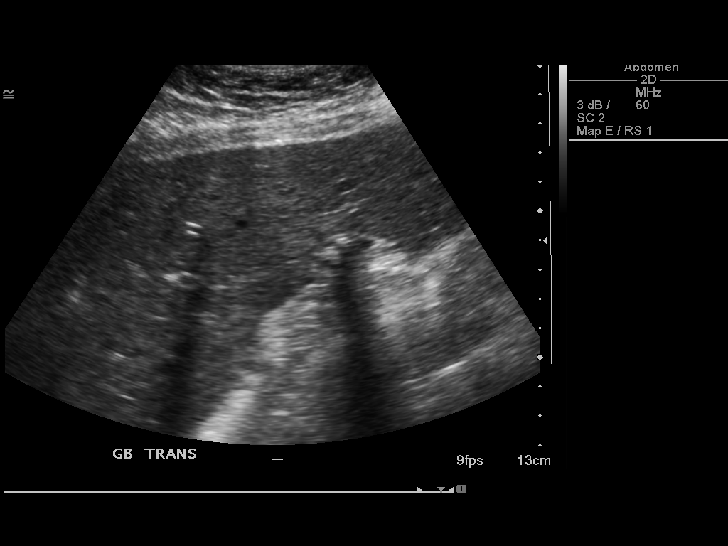
[im 70/70]
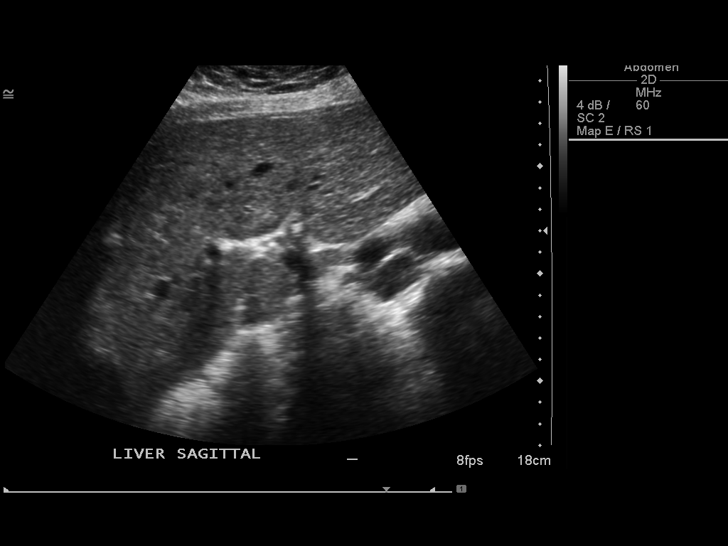

[14 of 25 positions shown; findings below may reference images not displayed]

FINDINGS: Gallbladder:  Incompletely distended containing innumerable stones.
Shadowing obscures visualization of the distal wall (wall-echo-
shadow complex).  No pericholecystic fluid or definite gallbladder
wall thickening.  Sonographer reports no sonographic Murphy's sign.

Common bile duct:  3.6 mm diameter, unremarkable

Liver:  No focal lesion identified.  Within normal limits in
parenchymal echogenicity.

IVC:  Appears normal.

Pancreas:  No focal abnormality seen.

Spleen:  7.9 cm.    Normal size and echotexture without focal
parenchymal abnormality.

Right Kidney:  11.4 cm. No hydronephrosis.  Well-preserved cortex.
Normal size and parenchymal echotexture without focal
abnormalities.

Left Kidney:  11.2 cm. No hydronephrosis.  Well-preserved cortex.
Normal size and parenchymal echotexture without focal
abnormalities.

Abdominal aorta:  No aneurysm identified.
IMPRESSION: 1. Cholelithiasis without other ultrasound evidence of
cholecystitis or biliary obstruction.

## 2010-05-12 MED ORDER — METRONIDAZOLE 500 MG PO TABS: ORAL_TABLET | ORAL | Status: DC

## 2010-05-12 MED ORDER — DOXYCYCLINE HYCLATE 100 MG PO TABS: 100.0000 mg | ORAL_TABLET | Freq: Two times a day (BID) | ORAL | Status: AC

## 2010-05-12 MED ORDER — KETOROLAC TROMETHAMINE 30 MG/ML IJ SOLN: 30.0000 mg | Freq: Once | INTRAMUSCULAR | Status: DC

## 2010-05-12 MED ORDER — CEFTRIAXONE SODIUM 250 MG IJ SOLR
250.0000 mg | Freq: Once | INTRAMUSCULAR | Status: AC
  Administered 2010-05-12: 250 mg via INTRAMUSCULAR

## 2010-05-12 MED ORDER — CEFTRIAXONE SODIUM 250 MG IJ SOLR: 250.0000 mg | Freq: Once | INTRAMUSCULAR | Status: DC

## 2010-05-12 MED ORDER — POLYETHYLENE GLYCOL 3350 17 G PO PACK: PACK | ORAL | Status: DC

## 2010-05-12 MED ORDER — KETOROLAC TROMETHAMINE 30 MG/ML IJ SOLN
30.0000 mg | Freq: Once | INTRAMUSCULAR | Status: AC
  Administered 2010-05-12: 30 mg via INTRAMUSCULAR

## 2010-05-12 NOTE — Progress Notes (Signed)
  Subjective:    Patient ID: Catherine Simmons, female    DOB: 1985/02/27, 25 y.o.   MRN: [id number]  Abdominal Pain This is a new problem. The current episode started in the past 7 days. The onset quality is sudden. The problem occurs intermittently. The problem has been waxing and waning. The pain is located in the RUQ, LLQ and RLQ. The pain is moderate. The quality of the pain is cramping and colicky. The abdominal pain does not radiate. Associated symptoms include constipation. The pain is aggravated by bowel movement. Treatments tried: NSAIDs. The treatment provided mild relief. Constipation, recurrent STDs  Pt states that pain started 2-3 days ago with onset of menses. Had post-partum depo shot 02/14/10. Denies any sexual activity since 12/2010. Pain exacerbated with bowel movements. No fever, rash, nausea, diarrhea, dysuria, vaginal discharge. + Intermittent back pain. Has been treated for constipation in the past with instructions to use miralax daily. Has been intermittently using.  Pt reports pain in RUQ and lower abdomen diffusely. Pain not worsened with eating. Pain is worst that she has ever felt per pt.     Review of Systems  Gastrointestinal: Positive for abdominal pain and constipation.       Objective:   Physical Exam Gen: up in chair, in moderate distress secondary to pain  CV: RRR, no murmurs auscultated PULM: CTAB, no wheezes, rales, rhoncii ABD: + TTP in RUQ and lower abdomen diffusely GU: + mild cervical motion tenderness, marked palpable stool on palpation of rectum posteriorly EXT: 2+ peripheral pulses     Assessment & Plan:  Abd pain- relatively broad differential for pt including constipation, dysmenorrhea, UTI, PID. Will obtain abdominal u/s to r/o cholecystitis, appendicitis, and ovarian torsion. 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 Will start pt on tid miralax given marked distal colon stool burden.  Upreg negative. Toradol 30mg  IM x1 in clinic. High dose ibuprofen for pain. Will avoid narcotics given constipation.

## 2010-05-12 NOTE — Patient Instructions (Addendum)
I have prescribed you a course of antibiotics to take for your pelvic infection I am also sending you for an ultrasound of your belly You can take ibuprofen 600 mg three times a day for your pain  Take miralax 3 times a day for your constipation I will call you or send a letter to you about the results of your lab work.  Come back in 1 week.  Come back sooner or call if your symptoms fail to improve.  God Bless Catherine Albee MD  Pelvic Inflammatory Disease (PID)  PID is an infection in some or all of your female organs. This includes the womb (uterus), ovaries, fallopian tubes and tissues in the pelvis. PID is a common cause of sudden onset (acute) lower abdominal (pelvic) pain. PID can be treated, but it is a serious infection. It may take weeks before you are completely well. In some cases, hospitalization is needed for surgery or to administer medications to kill germs (antibiotics) through your veins (intravenously). CAUSES  It may be caused by germs that are spread during sexual contact.  PID can also occur following: Constipation in Adults Constipation is having fewer than 2 bowel movements per week. Usually, the stools are hard. As we grow older, constipation is more common. If you try to fix constipation with laxatives, the problem may get worse. This is because laxatives taken over a long period of time make the colon muscles weaker. A low-fiber diet, not taking in enough fluids, and taking some medicines may make these problems worse. MEDICATIONS THAT MAY CAUSE CONSTIPATION Water pills (diuretics). Calcium channel blockers (used to control blood pressure and for the heart).  Certain pain medicines (narcotics).  Anticholinergics. Anti-inflammatory agents.  Antacids that contain aluminum.   DISEASES THAT CONTRIBUTE TO CONSTIPATION Diabetes. Parkinson's disease.  Dementia.  Stroke. Depression.  Illnesses that cause problems with salt and water metabolism.   HOME CARE  INSTRUCTIONS Constipation is usually best cared for without medicines. Increasing dietary fiber and eating more fruits and vegetables is the best way to manage constipation.  Slowly increase fiber intake to 25 to 38 grams per day. Whole grains, fruits, vegetables, and legumes are good sources of fiber. A dietitian can further help you incorporate high-fiber foods into your diet.  Drink enough water and fluids to keep your urine clear or pale yellow.  A fiber supplement may be added to your diet if you cannot get enough fiber from foods.  Increasing your activities also helps improve regularity.  Suppositories, as suggested by your caregiver, will also help. If you are using antacids, such as aluminum or calcium containing products, it will be helpful to switch to products containing magnesium if your caregiver says it is okay.  If you have been given a liquid injection (enema) today, this is only a temporary measure. It should not be relied on for treatment of longstanding (chronic) constipation.  Stronger measures, such as magnesium sulfate, should be avoided if possible. This may cause uncontrollable diarrhea. Using magnesium sulfate may not allow you time to make it to the bathroom.  SEEK IMMEDIATE MEDICAL CARE IF: There is bright red blood in the stool.  The constipation stays for more than 4 days.  There is belly (abdominal) or rectal pain.  You do not seem to be getting better.  You have any questions or concerns.  MAKE SURE YOU: Understand these instructions.  Will watch your condition.  Will get help right away if you are not doing well or get  worse.  Document Released: 10/25/2003 Document Re-Released: 04/22/2009  [facility] Patient Information 2011 [place], [place].  The birth of a baby.   A miscarriage.   An abortion.   Major surgery of the pelvis.   Use of an IUD.   Sexual assault.  SYMPTOMS  Abdominal or pelvic pain.   Fever.   Chills.   Abnormal vaginal discharge.   DIAGNOSIS Your caregiver will choose some of these methods to make a diagnosis:  A physical exam and history.   Blood tests.   Cultures of the vagina and cervix.   X-rays or ultrasound.   A procedure to look inside the pelvis (laparoscopy).  TREATMENT  Use of antibiotics by mouth or intravenously.   Treatment of sexual partners when the infection is an sexually transmitted disease (STD).   Hospitalization and surgery may be needed.  RISKS AND COMPLICATIONS  PID can cause women to become unable to have children (sterile) if left untreated or if partially treated. That is why it is important to finish all medications given to you.   Sterility or future tubal (ectopic) pregnancies can occur in fully treated individuals. This is why it is so important to follow your prescribed treatment.   It can cause longstanding (chronic) pelvic pain after frequent infections.   Painful intercourse.   Pelvic abscesses.   In rare cases, surgery or a hysterectomy may be needed.   If this is a sexually transmitted infection (STI), you are also at risk for any other STD including AIDS or human papillomavirus (HIV).  HOME CARE INSTRUCTIONS  Finish all medication as prescribed. Incomplete treatment will put you at risk for sterility and tubal pregnancy.   Only take over-the-counter or prescription medicines for pain, discomfort, or fever as directed by your caregiver.   Do not have sex until treatment is completed or as directed by your caregiver. If PID is confirmed, your recent sexual contacts will need treatment.   Keep your follow-up appointments.  SEEK MEDICAL CARE IF:  You have an oral temperature above 100.5.   You have increased or abnormal vaginal discharge.   You need prescription medication for your pain.   Your partner has an STD.   You are vomiting.   You cannot take your medications.  SEEK IMMEDIATE MEDICAL CARE IF:  You have an oral temperature above 100.5, not  controlled by medicine.   You develop increased abdominal or pelvic pain.   You develop chills.   You have pain when you urinate.   You are not better after 72 hours following treatment.  FINDING OUT THE RESULTS OF YOUR TEST Not all test results are available during your visit. If your test results are not back during the visit, make an appointment with your caregiver to find out the results. Do not assume everything is normal if you have not heard from your caregiver or the medical facility. It is important for you to follow up on all of your test results. Document Released: 01/26/2005 Document Re-Released: 07/16/2009 [facility] Patient Information 2011 [place], [place].

## 2010-05-13 DIAGNOSIS — R109 Unspecified abdominal pain: Secondary | ICD-10-CM | POA: Insufficient documentation

## 2010-05-13 NOTE — Assessment & Plan Note (Signed)
relatively broad differential for pt including constipation, dysmenorrhea, UTI, PID. Will obtain abdominal u/s to r/o cholecystitis, appendicitis, and ovarian torsion. 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 Will start pt on tid miralax given marked distal colon stool burden. Upreg negative. Toradol 30mg  IM x1 in clinic. High dose ibuprofen for pain. Will avoid narcotics given constipation.

## 2010-05-14 ENCOUNTER — Ambulatory Visit (INDEPENDENT_AMBULATORY_CARE_PROVIDER_SITE_OTHER): Payer: Medicaid Other | Admitting: Family Medicine

## 2010-05-14 ENCOUNTER — Encounter: Payer: Self-pay | Admitting: Family Medicine

## 2010-05-14 ENCOUNTER — Ambulatory Visit (HOSPITAL_COMMUNITY): Payer: Medicaid Other | Attending: Family Medicine

## 2010-05-14 VITALS — BP 155/104 | HR 80 | Temp 98.2°F | Wt 215.0 lb

## 2010-05-14 DIAGNOSIS — K047 Periapical abscess without sinus: Secondary | ICD-10-CM

## 2010-05-14 DIAGNOSIS — R079 Chest pain, unspecified: Secondary | ICD-10-CM

## 2010-05-14 DIAGNOSIS — N898 Other specified noninflammatory disorders of vagina: Secondary | ICD-10-CM

## 2010-05-14 DIAGNOSIS — I10 Essential (primary) hypertension: Secondary | ICD-10-CM | POA: Insufficient documentation

## 2010-05-14 DIAGNOSIS — K0889 Other specified disorders of teeth and supporting structures: Secondary | ICD-10-CM

## 2010-05-14 DIAGNOSIS — F32A Depression, unspecified: Secondary | ICD-10-CM

## 2010-05-14 DIAGNOSIS — F329 Major depressive disorder, single episode, unspecified: Secondary | ICD-10-CM

## 2010-05-14 LAB — URINALYSIS, ROUTINE W REFLEX MICROSCOPIC
Bilirubin Urine: NEGATIVE
Glucose, UA: NEGATIVE mg/dL
Hgb urine dipstick: NEGATIVE
Nitrite: NEGATIVE
Protein, ur: NEGATIVE mg/dL
Specific Gravity, Urine: 1.022 (ref 1.005–1.030)
Urobilinogen, UA: 0.2 mg/dL (ref 0.0–1.0)
pH: 6 (ref 5.0–8.0)

## 2010-05-14 LAB — WET PREP, GENITAL
Trich, Wet Prep: NONE SEEN
Yeast Wet Prep HPF POC: NONE SEEN

## 2010-05-14 LAB — PREGNANCY, URINE: Preg Test, Ur: NEGATIVE

## 2010-05-14 LAB — GC/CHLAMYDIA PROBE AMP, GENITAL
Chlamydia, DNA Probe: NEGATIVE
GC Probe Amp, Genital: NEGATIVE

## 2010-05-14 LAB — COMPREHENSIVE METABOLIC PANEL
AST: 14 U/L (ref 0–37)
Alkaline Phosphatase: 72 U/L (ref 39–117)
BUN: 17 mg/dL (ref 6–23)
Calcium: 9.3 mg/dL (ref 8.4–10.5)
Creat: 0.81 mg/dL (ref 0.40–1.20)

## 2010-05-14 MED ORDER — HYDROCHLOROTHIAZIDE 25 MG PO TABS: 25.0000 mg | ORAL_TABLET | Freq: Every day | ORAL | Status: DC

## 2010-05-14 MED ORDER — AMLODIPINE BESYLATE 10 MG PO TABS: 10.0000 mg | ORAL_TABLET | Freq: Every day | ORAL | Status: DC

## 2010-05-14 MED ORDER — CLONIDINE HCL 0.2 MG PO TABS
0.2000 mg | ORAL_TABLET | Freq: Once | ORAL | Status: AC
  Administered 2010-05-14: 0.2 mg via ORAL

## 2010-05-14 NOTE — Progress Notes (Signed)
  Subjective:    Patient ID: Catherine Simmons, female    DOB: 1985-07-19, 25 y.o.   MRN: [id number]  HPI      HTN- pt seen at dentist office today, prior to anesthesia BP was 192/127, multiple readings were elevated, HR was 60 so meds could not be given, upon re-evaluation in office initial manual BP taken by me was 180/120.  Pt admits to chest pain on and off the past few days- located in center of chest, moves to back that last a few seconds, last occurred yesterday while walking to the park with her son. Also admits to floaters she has in both eyes for the past year, no SOB, no nausea, no diaphoresis Seen Monday for GYN complaint given NSAIDS   Review of Systems per above     Objective:   Physical Exam     GEN- NAD, alert and oriented obese    CVS- RRR, no murmur- HR 80's    HEENT- PERRL, EOMI, fundoscopic exam benign    Neck Supple    RESP- CTAB    EXT- no edema  EKG-NSR, mild background interference, flat T wave in V 3, no ST changes  Assessment & Plan:

## 2010-05-14 NOTE — Patient Instructions (Signed)
For your blood pressure- take both the norvasc and the HCTZ Stop taking the ibuprofen until cleared by Dr. Alvester Morin We will check your labs today. Follow-up tomorrow for a nurse visit for blood pressure check- make sure you take both of your medications. Schedule a visit with Dr. Alvester Morin next week to follow up your blood pressure

## 2010-05-14 NOTE — Assessment & Plan Note (Signed)
No signs of ischemia on EKG today BP is being managed Labs obtained Pt stable for d/c home from clinic

## 2010-05-14 NOTE — Assessment & Plan Note (Addendum)
Severely high BP, chest discomoft and vision changes could be from BP, though vision changes occurred prior to diagnosis of HTN.  EKG reassuring very young female otherwise Given clonidine during visit- pt tolerated well BP improved to 150/105, pt should have been on both meds HCTZ and norvasc but was confused. Hold NSAIDS  Restart dual therapy, RTC for recheck Will need a note for BP clearance for Benson Norway DMD

## 2010-05-15 ENCOUNTER — Ambulatory Visit (INDEPENDENT_AMBULATORY_CARE_PROVIDER_SITE_OTHER): Payer: Medicaid Other | Admitting: *Deleted

## 2010-05-15 VITALS — BP 140/90 | HR 84

## 2010-05-15 DIAGNOSIS — I10 Essential (primary) hypertension: Secondary | ICD-10-CM

## 2010-05-15 LAB — CBC
HCT: 33 % — ABNORMAL LOW (ref 36.0–46.0)
Hemoglobin: 10.5 g/dL — ABNORMAL LOW (ref 12.0–15.0)
Platelets: 301 10*3/uL (ref 150–400)
RDW: 14 % (ref 11.5–15.5)

## 2010-05-15 NOTE — Progress Notes (Signed)
In for BP check today. BP checked manually with large adult cuff.  BP 140/90 bilaterally. Pulse 84. Consulted Dr. Deirdre Priest and he advises for patient to continue current meds and follow up with Dr. Alvester Morin in two weeks . Appointment schedueld for 05/28/2010.

## 2010-05-17 NOTE — Progress Notes (Signed)
Lab not drawn

## 2010-05-17 NOTE — Progress Notes (Signed)
Lab not drawn at visit

## 2010-05-19 ENCOUNTER — Telehealth: Payer: Self-pay | Admitting: Family Medicine

## 2010-05-19 NOTE — Telephone Encounter (Signed)
Discussed lab results with patient She likely needs to restart Iron supplementation for another 6 months, post partum She has been taking both anti-hypertensives and tolerating well. She wanted a note faxed to Dr. Chipper Herb office. I told her she needs to see Dr. Alvester Morin first, then he will clear her if BP under control

## 2010-05-28 ENCOUNTER — Ambulatory Visit (INDEPENDENT_AMBULATORY_CARE_PROVIDER_SITE_OTHER): Payer: Medicaid Other | Admitting: Family Medicine

## 2010-05-28 ENCOUNTER — Other Ambulatory Visit: Payer: Self-pay | Admitting: Family Medicine

## 2010-05-28 ENCOUNTER — Encounter: Payer: Self-pay | Admitting: *Deleted

## 2010-05-28 ENCOUNTER — Encounter: Payer: Self-pay | Admitting: Family Medicine

## 2010-05-28 VITALS — BP 133/89 | HR 93 | Temp 98.1°F | Ht 62.0 in | Wt 215.8 lb

## 2010-05-28 DIAGNOSIS — N920 Excessive and frequent menstruation with regular cycle: Secondary | ICD-10-CM

## 2010-05-28 DIAGNOSIS — I10 Essential (primary) hypertension: Secondary | ICD-10-CM

## 2010-05-28 DIAGNOSIS — K089 Disorder of teeth and supporting structures, unspecified: Secondary | ICD-10-CM

## 2010-05-28 DIAGNOSIS — R109 Unspecified abdominal pain: Secondary | ICD-10-CM

## 2010-05-28 DIAGNOSIS — K8020 Calculus of gallbladder without cholecystitis without obstruction: Secondary | ICD-10-CM | POA: Insufficient documentation

## 2010-05-28 MED ORDER — NORGESTIMATE-ETH ESTRADIOL 0.25-35 MG-MCG PO TABS: 1.0000 | ORAL_TABLET | Freq: Every day | ORAL | Status: DC

## 2010-05-28 NOTE — Assessment & Plan Note (Signed)
Medical clearance letter given to pt.

## 2010-05-28 NOTE — Progress Notes (Signed)
[place] scheduled at [place] 05/29/10 at 1:30. Patient notified of appointment in office. Prior Berkley Harvey #[phone]

## 2010-05-28 NOTE — Assessment & Plan Note (Signed)
Overall well controlled currently. Would anticipate titration of BP meds given starting of OCPs. Next step would be increase in HCTZ vs. Addition of ACEi. Will follow up in 2 weeks to reassess BP.

## 2010-05-28 NOTE — Patient Instructions (Signed)
Gallbladder Disease (Cholecystitis) Gallbladder disease (cholecystitis) is an inflammation of your gallbladder. It is usually caused by a build-up of stones (gallstones) or sludge (cholelithiasis) in your gallbladder. The gallbladder is not an essential organ. It is located slightly to the right of center in the belly (abdomen), behind the liver. It stores bile made in the liver. Bile aids in digestion of fats. Gallbladder disease may result in nausea (feeling sick to your stomach), abdominal pain, and jaundice. In severe cases, emergency surgery may be required. The most common type of gallbladder disease is gallstones. They begin as small crystals and slowly grow into stones. Gallstone pain occurs when the bile duct has spasms. The spasms are caused by the stone passing out of the duct. The stone is trying to pass at the same time bile is passing into the small bowel for digestion. The pain usually begins suddenly. It may persist from several minutes to several hours. Infection can occur. Infection can add to discomfort and severity of an acute attack. The pain may be made worse by breathing deeply or by being jarred. There may be fever and tenderness to the touch. In some cases, when gallstones do not move into the bile duct, people have no pain or symptoms. These are called "silent" gallstones. Women are three times more likely to develop gallstones than men. Women who have had several pregnancies are more likely to have gallbladder disease. Physicians sometimes advise removing diseased gallbladders before future pregnancies. Other factors that increase the risk of gallbladder disease are obesity, diets heavy in fried foods and dairy products, increasing age, prolonged use of medications containing female hormones, and heredity. HOME CARE INSTRUCTIONS  If your physician prescribed an antibiotic, take as directed.   Only take over-the-counter or prescription medicines for pain, discomfort, or fever as  directed by your caregiver.   Follow a low fat diet until seen again. (Fat causes the gallbladder to contract.)   Follow-up as instructed. Attacks are almost always recurrent and surgery is usually required for permanent treatment.  SEEK IMMEDIATE MEDICAL CARE IF:  Pain is increasing and not controlled by medications.   The pain moves to another part of your abdomen or to your back. (Right sided pain can be appendicitis and left sided pain in adults can be diverticulitis).   You have an oral temperature above 100.5 develops, is not controlled by medication, or as your caregiver suggests.   You develop nausea and vomiting.  Document Released: 01/26/2005 Document Re-Released: 07/15/2007 [facility] Patient Information 2011 [place], [place].

## 2010-05-28 NOTE — Assessment & Plan Note (Signed)
Multifactorial with likely contribution of intermittent biliary colic in setting of baseline cholelithiasis and dysmenorrhea. Will obtain pelvic u/s to further evaluate dysmenorrhea. Will start pt on trial of OCPs. Also encouraged fatty food avoidance for RUQ pain. Plan to follow up in 1-2 weeks.   -Pt also reports intermittent smoking of "black and mild". Discouraged use. Discussed risk of blood clots with pt. Pt agreeable to discontinuation of use.

## 2010-05-28 NOTE — Progress Notes (Signed)
  Subjective:    Patient ID: Catherine Simmons, female    DOB: 05-07-1985, 25 y.o.   MRN: [id number]  HPI 1)HTN - Pt recently seen 4/9 for elevated SBPs into 180s in setting of high dose NSAID use and medication non-use. SBPs now ranging  in 130s at home. Now has home BP cuff. No HA, CP, SOB. Is no longer taking NSAIDs or tylenol per pt. Currently compliant with taking norvasc and HCTZ.   2)Abd Pain- Still with abdominal pain. Was scheduled for complete U/S. However, only received RUQ ultrasound per report.  Pt reports persistence of abdominal pain. No fevers, dysuria, flank pain. Now stooling once daily in setting of BID miralax use. Pain present in RUQ and pelvis. Pain most prominent in pelvic area.   RUQ- Pain intermittently colicky in nature. + Radiation to back intermittently. Sometimes associated with fatty foods.   Pelvic- Pt also with persistent intemittent menstrual bleeding and pelvic pain. Pt states that pelvic pain is associated with menstrual bleeding. Has been on OCPs in the past for dysmenorrhea with marked improvement and pelvic pain per pt.  3)Dental Pain- Had elevated SBPs into the 180s on day of tooth removal(Clinic visit 4/9). Procedure was cancelled pending BP control. Pt now with intermittent pain. No fevers, swelling, redness, drainage, or trouble swallowing. Pt in need of medical clearance for tooth extraction.    Review of Systems See HPI     Objective:   Physical Exam Gen: up in chair, NAD HEENT: NCAT, + broken tooth in R upper teeth.  CV: RRR, no murmurs auscultated PULM: CTAB, no wheezes ABD: S/mild TTP in lower abdomen EXT: 2+ peripheral pulses    Assessment & Plan:  Abd Pain- Multifactorial with likely contribution of intermittent biliary colic in setting of baseline cholelithiasis and dysmenorrhea. Will obtain pelvic u/s to further evaluate dysmenorrhea. Will start pt on trial of OCPs. Also encouraged fatty food avoidance for RUQ pain. Plan to follow up in 1-2  weeks.   -Pt also reports intermittent smoking of "black and mild". Discouraged use. Discussed risk of blood clots with pt. Pt agreeable to discontinuation of use.   Dental Pain- Medical clearance letter given to pt.  HTN- Overall well controlled currently. Would anticipate titration of BP meds given starting of OCPs. Next step would be increase in HCTZ vs. Addition of ACEi. Will follow up in 2 weeks to reassess BP.

## 2010-05-29 ENCOUNTER — Ambulatory Visit
Admission: RE | Admit: 2010-05-29 | Discharge: 2010-05-29 | Disposition: A | Discharge status: Home / Self Care | Payer: Medicaid Other | Source: Ambulatory Visit | Attending: Family Medicine | Admitting: Family Medicine

## 2010-05-29 DIAGNOSIS — N920 Excessive and frequent menstruation with regular cycle: Secondary | ICD-10-CM

## 2010-05-29 IMAGING — US US TRANSVAGINAL NON-OB
1 series · 14 of 25 positions shown · non-contrast
Comparison: None.

CLINICAL DATA: History of menorrhagia.



[Series 1: us transvaginal non-ob · 0.27mm/px · 14 of 58 slices shown]
[im 1/58]
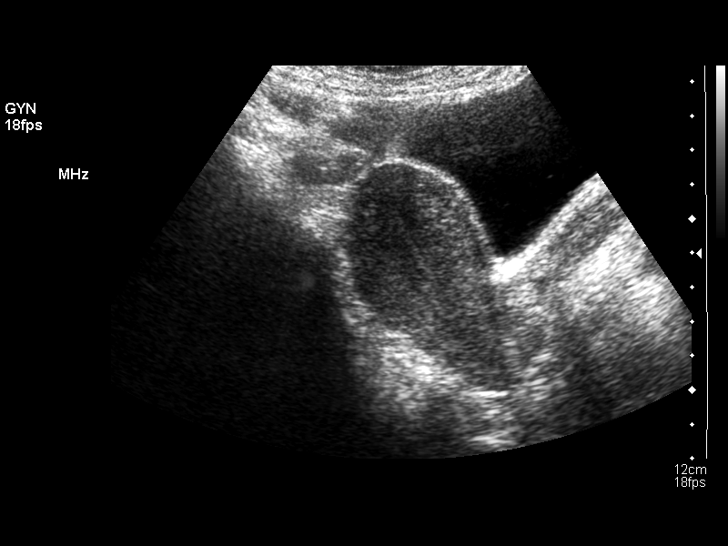
[im 5/58]
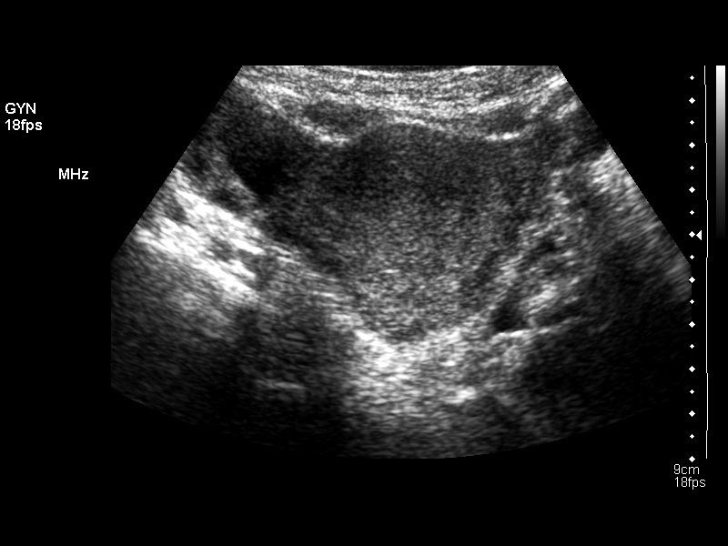
[im 10/58]
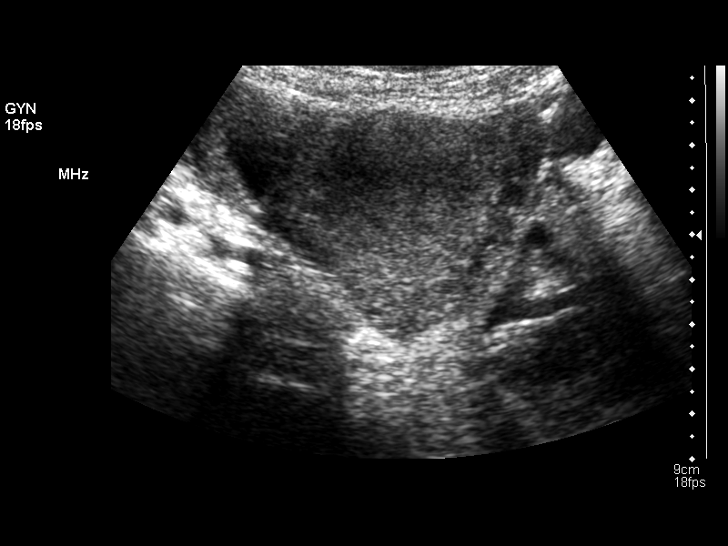
[im 15/58]
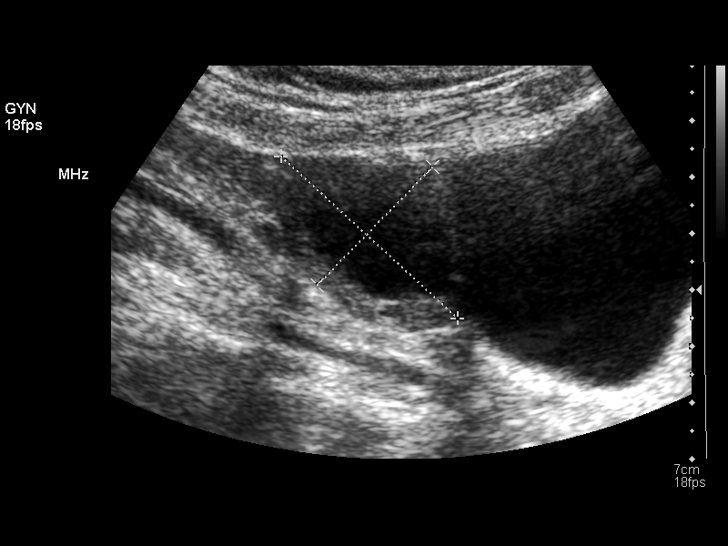
[im 20/58]
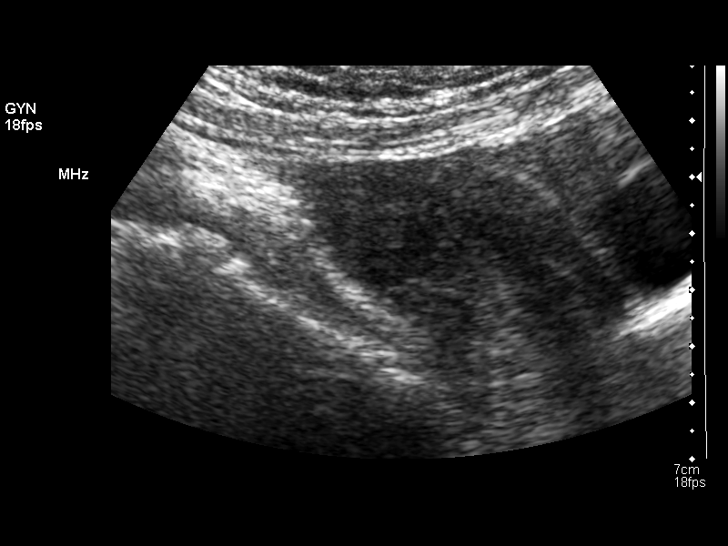
[im 22/58]
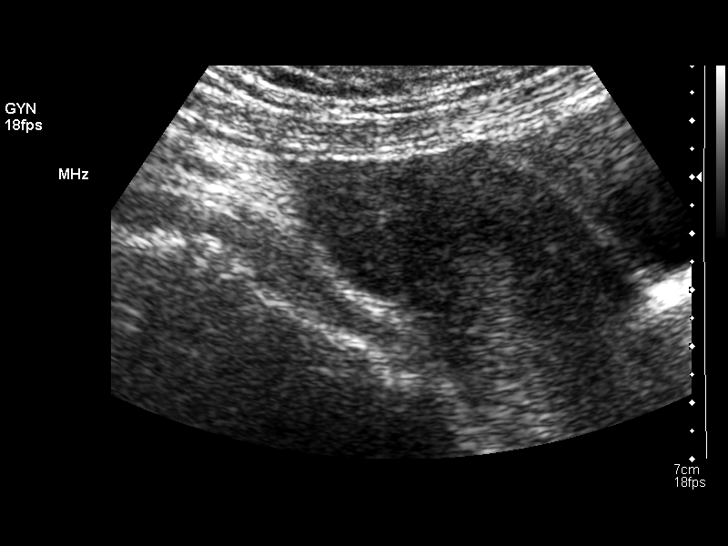
[im 27/58]
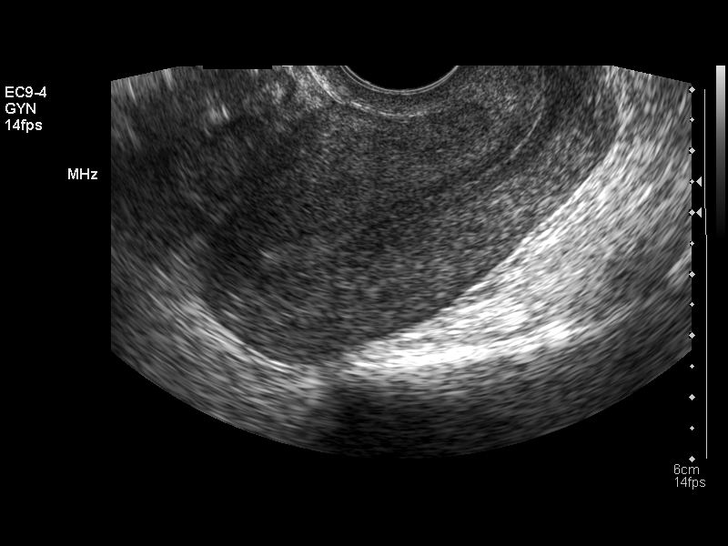
[im 31/58]
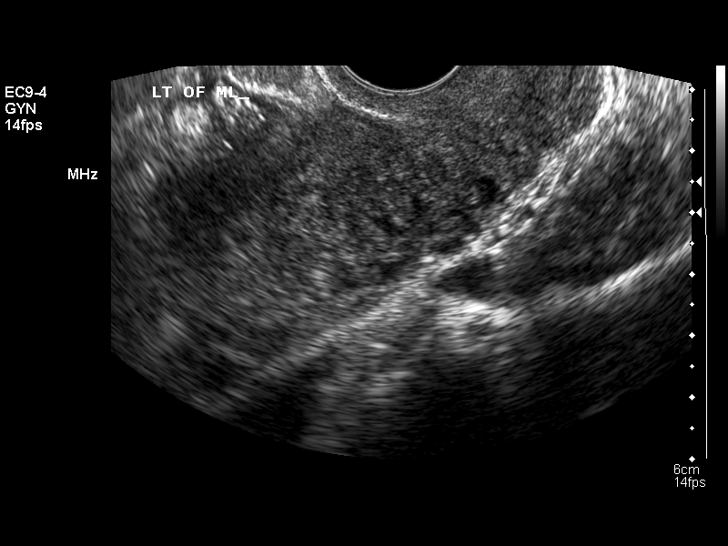
[im 36/58]
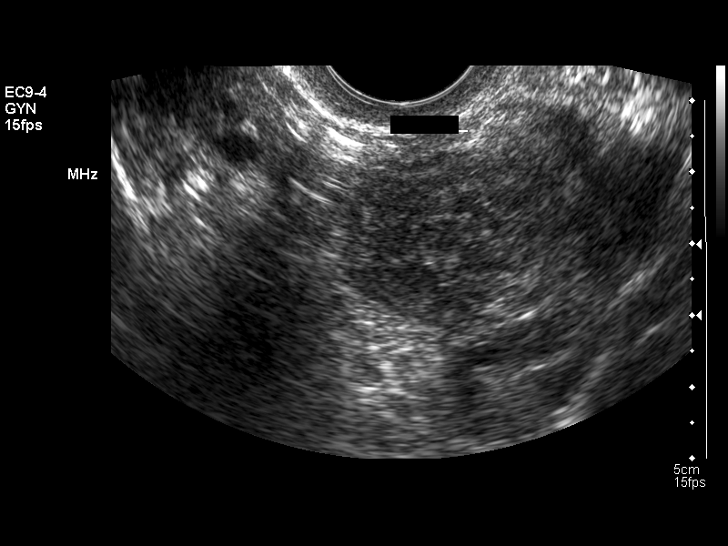
[im 39/58]
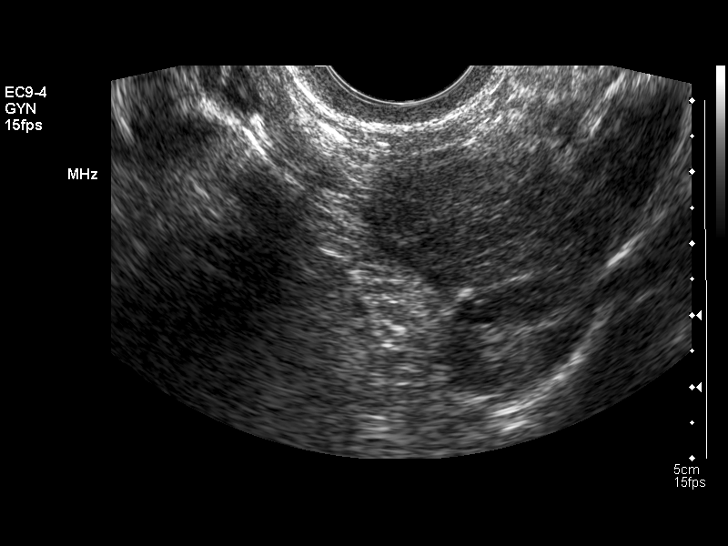
[im 43/58]
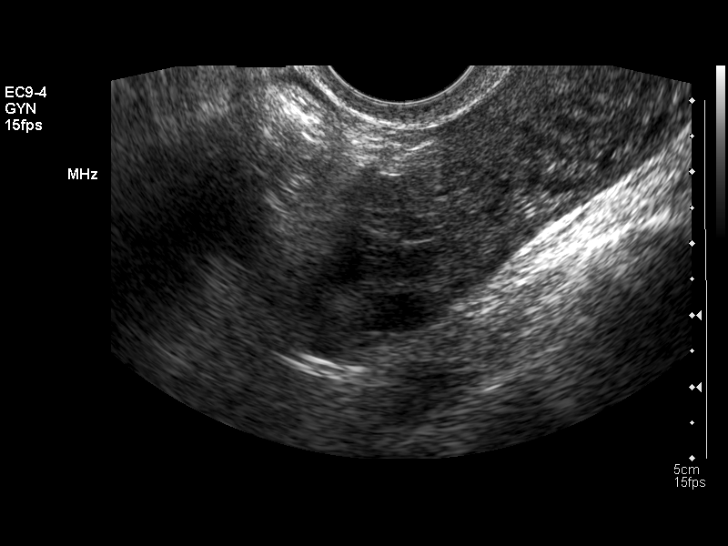
[im 48/58]
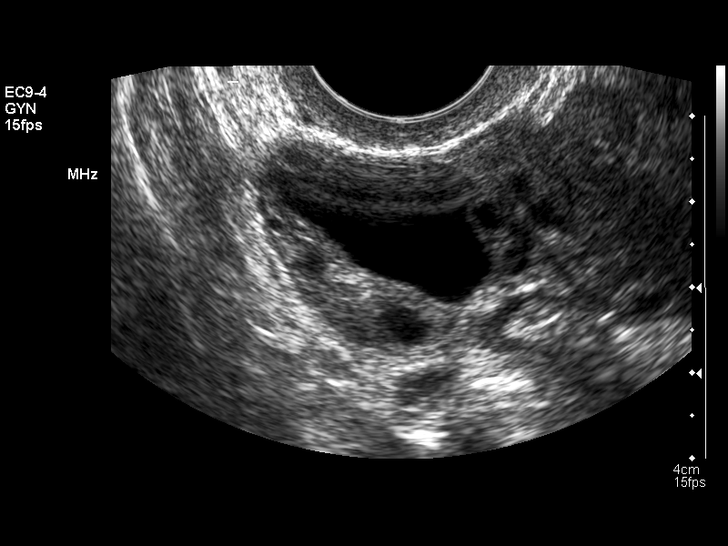
[im 53/58]
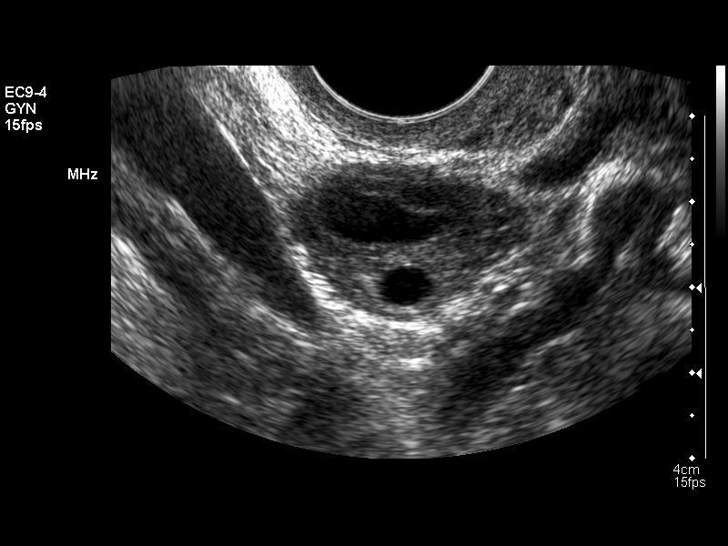
[im 58/58]
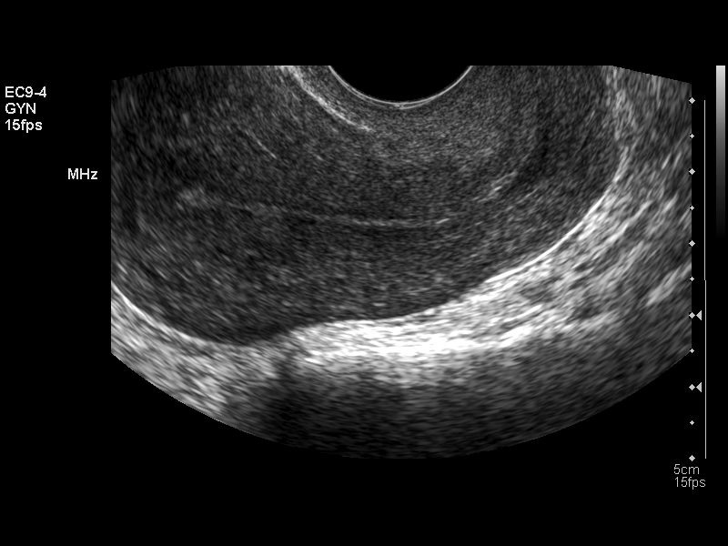

[14 of 25 positions shown; findings below may reference images not displayed]

FINDINGS: Uterus:  Uterus measures 7.7 x 4.0 x 5.3 cm.  No fibroids or other
uterine masses identified.

Endometrium:  Measures at 3.7 mm thickness.  No endometrial mass or
fluid collection is seen.

Right ovary:  Right ovary measures 3.8 x 2.4 x 3.3 cm.  .  A cyst
measures 2.8 x 2.5 x 1.2 cm. This appears simple and is less than 3
cm and is felt to be functional normal cyst.

Left ovary:  Left ovary measures 3.2 x 1.7 x 2.5 cm.  Normal
appearance/no adnexal mass.

Other findings:  No free fluid.
IMPRESSION: No evidence of pelvic mass or other significant abnormality.

## 2010-05-30 ENCOUNTER — Encounter: Payer: Self-pay | Admitting: Family Medicine

## 2010-06-20 ENCOUNTER — Ambulatory Visit: Payer: Medicaid Other | Admitting: Family Medicine

## 2010-07-16 ENCOUNTER — Emergency Department (HOSPITAL_COMMUNITY): Payer: Medicaid Other

## 2010-07-16 ENCOUNTER — Inpatient Hospital Stay (HOSPITAL_COMMUNITY)
Admission: EM | Admit: 2010-07-16 | Discharge: 2010-07-18 | DRG: 419 | Disposition: A | Discharge status: Home / Self Care | Payer: Medicaid Other | Attending: General Surgery | Admitting: General Surgery

## 2010-07-16 DIAGNOSIS — Z6839 Body mass index (BMI) 39.0-39.9, adult: Secondary | ICD-10-CM

## 2010-07-16 DIAGNOSIS — I10 Essential (primary) hypertension: Secondary | ICD-10-CM | POA: Diagnosis present

## 2010-07-16 DIAGNOSIS — E669 Obesity, unspecified: Secondary | ICD-10-CM | POA: Diagnosis present

## 2010-07-16 DIAGNOSIS — F17200 Nicotine dependence, unspecified, uncomplicated: Secondary | ICD-10-CM | POA: Diagnosis present

## 2010-07-16 DIAGNOSIS — G4730 Sleep apnea, unspecified: Secondary | ICD-10-CM | POA: Diagnosis present

## 2010-07-16 DIAGNOSIS — Z79899 Other long term (current) drug therapy: Secondary | ICD-10-CM

## 2010-07-16 DIAGNOSIS — N83209 Unspecified ovarian cyst, unspecified side: Secondary | ICD-10-CM | POA: Diagnosis present

## 2010-07-16 DIAGNOSIS — K8000 Calculus of gallbladder with acute cholecystitis without obstruction: Principal | ICD-10-CM | POA: Diagnosis present

## 2010-07-16 DIAGNOSIS — K819 Cholecystitis, unspecified: Secondary | ICD-10-CM

## 2010-07-16 HISTORY — DX: Cholecystitis, unspecified: K81.9

## 2010-07-16 LAB — CBC
HCT: 34.9 % — ABNORMAL LOW (ref 36.0–46.0)
Hemoglobin: 11.3 g/dL — ABNORMAL LOW (ref 12.0–15.0)
MCH: 23.8 pg — ABNORMAL LOW (ref 26.0–34.0)
MCV: 73.5 fL — ABNORMAL LOW (ref 78.0–100.0)
Platelets: 284 10*3/uL (ref 150–400)
RBC: 4.75 MIL/uL (ref 3.87–5.11)
WBC: 7.7 10*3/uL (ref 4.0–10.5)

## 2010-07-16 LAB — COMPREHENSIVE METABOLIC PANEL
Albumin: 3.1 g/dL — ABNORMAL LOW (ref 3.5–5.2)
Alkaline Phosphatase: 70 U/L (ref 39–117)
BUN: 15 mg/dL (ref 6–23)
CO2: 22 mEq/L (ref 19–32)
Chloride: 106 mEq/L (ref 96–112)
GFR calc non Af Amer: >60 mL/min (ref >60)
Potassium: 4 mEq/L (ref 3.5–5.1)
Total Bilirubin: 0.2 mg/dL — ABNORMAL LOW (ref 0.3–1.2)

## 2010-07-16 LAB — URINALYSIS, ROUTINE W REFLEX MICROSCOPIC
Bilirubin Urine: NEGATIVE
Glucose, UA: NEGATIVE mg/dL
Ketones, ur: NEGATIVE mg/dL
Nitrite: NEGATIVE
Protein, ur: NEGATIVE mg/dL
pH: 5.5 (ref 5.0–8.0)

## 2010-07-16 LAB — DIFFERENTIAL
Basophils Relative: 0 % (ref 0–1)
Eosinophils Absolute: 0.2 10*3/uL (ref 0.0–0.7)
Eosinophils Relative: 3 % (ref 0–5)
Lymphocytes Relative: 50 % — ABNORMAL HIGH (ref 12–46)
Monocytes Relative: 6 % (ref 3–12)
Neutro Abs: 3.2 10*3/uL (ref 1.7–7.7)
Neutrophils Relative %: 41 % — ABNORMAL LOW (ref 43–77)

## 2010-07-16 LAB — POCT PREGNANCY, URINE: Preg Test, Ur: NEGATIVE

## 2010-07-16 IMAGING — US US ABDOMEN COMPLETE
1 series · 14 of 25 positions shown · non-contrast
Comparison: [DATE]

CLINICAL DATA: Abdominal pain

COMPLETE ABDOMINAL ULTRASOUND

[Series 1: us abdomen complete · 0.31mm/px · 14 of 64 slices shown]
[im 1/64]
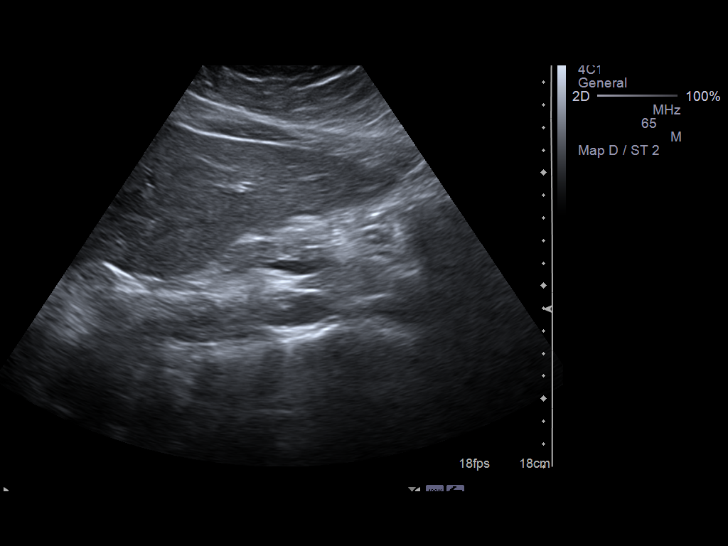
[im 6/64]
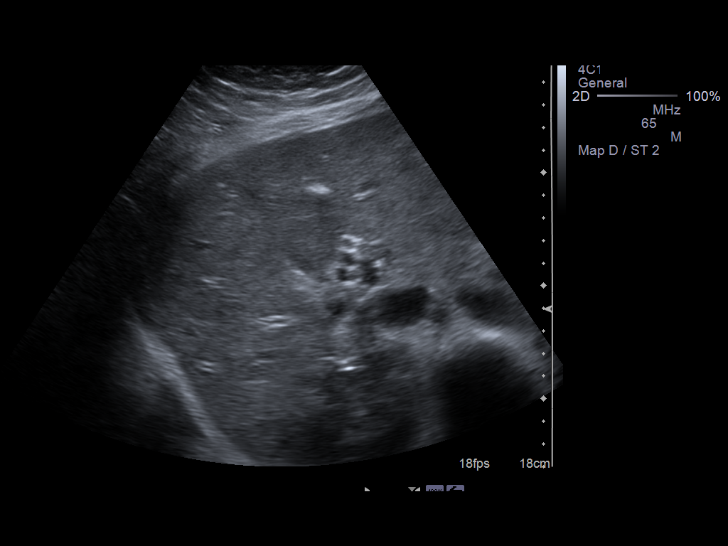
[im 11/64]
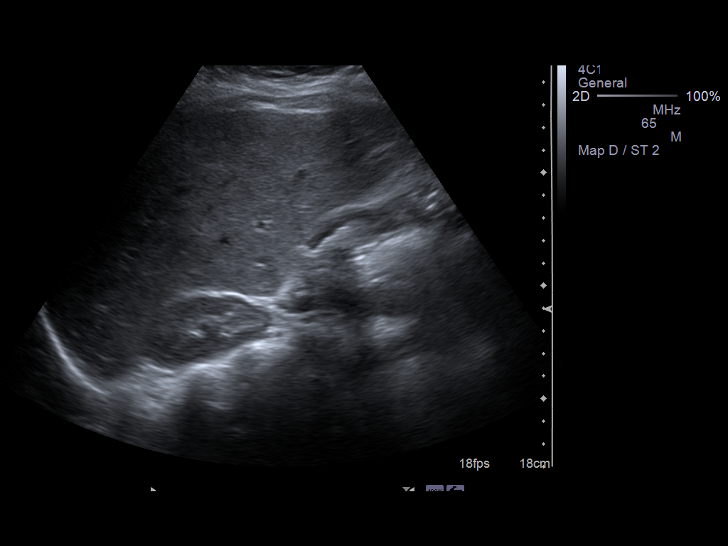
[im 16/64]
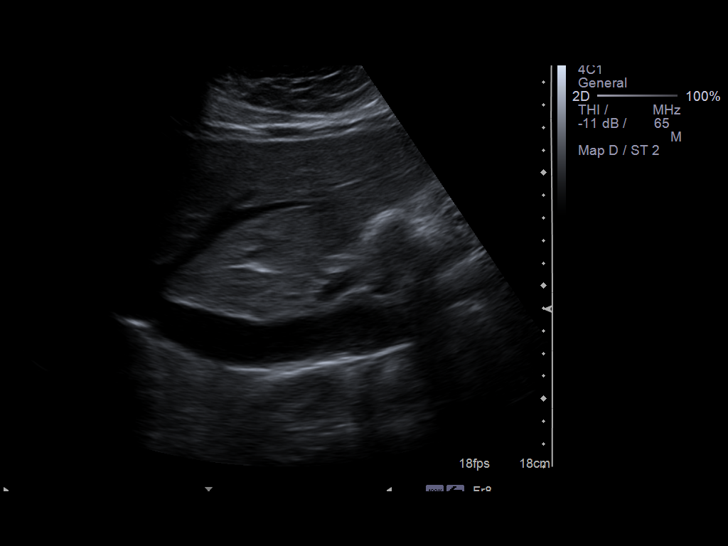
[im 22/64]
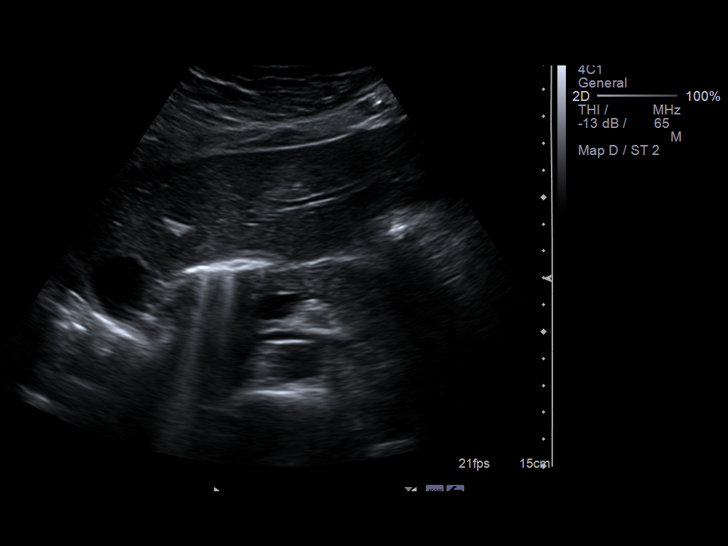
[im 24/64]
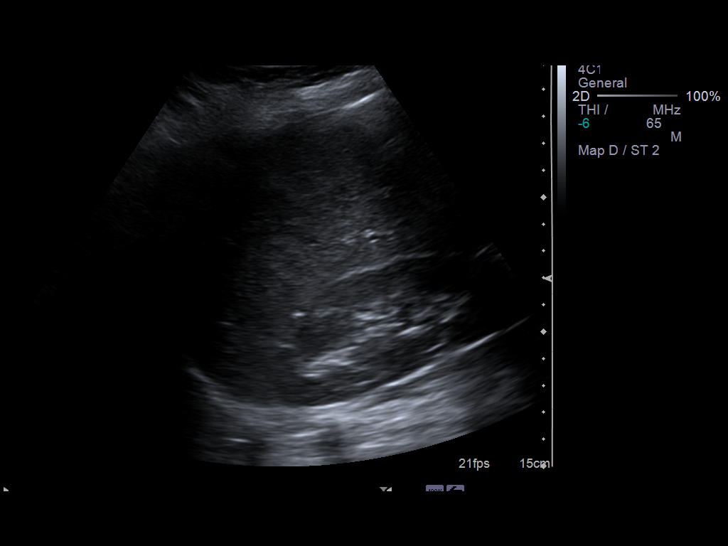
[im 29/64]
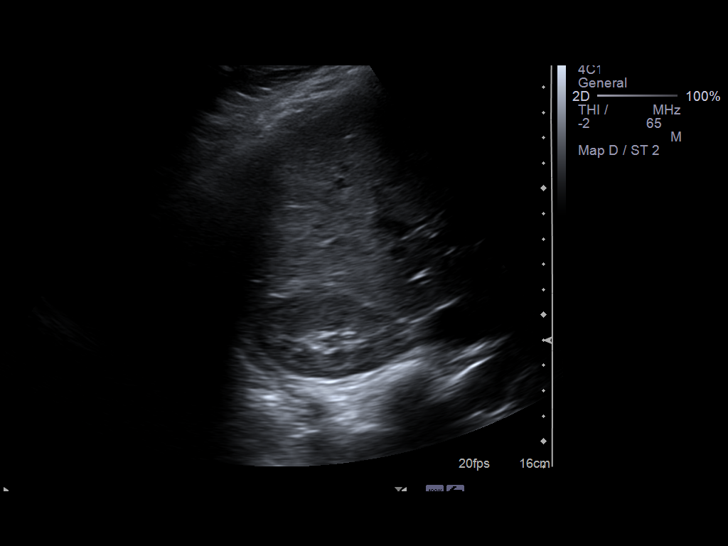
[im 35/64]
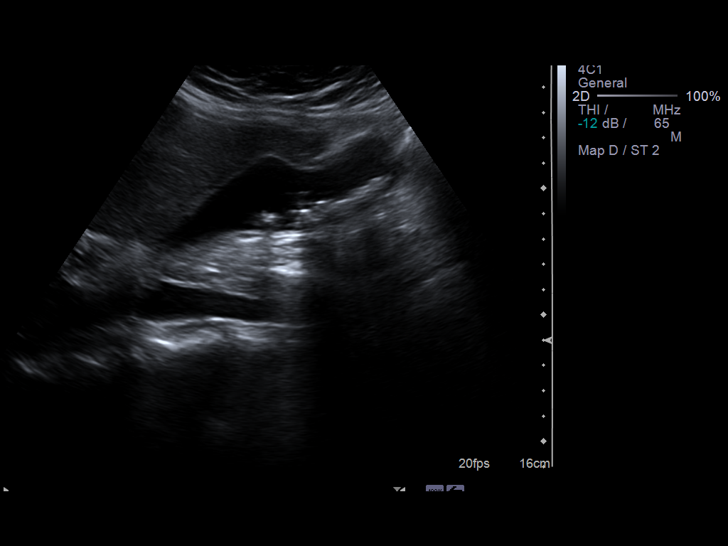
[im 40/64]
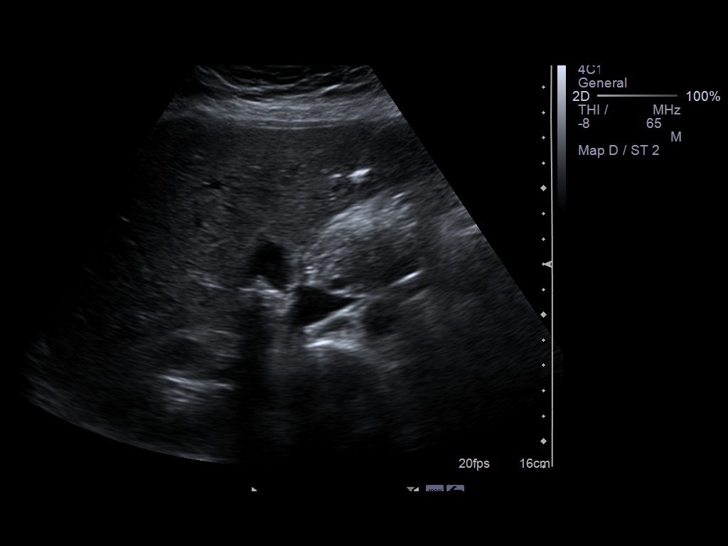
[im 43/64]
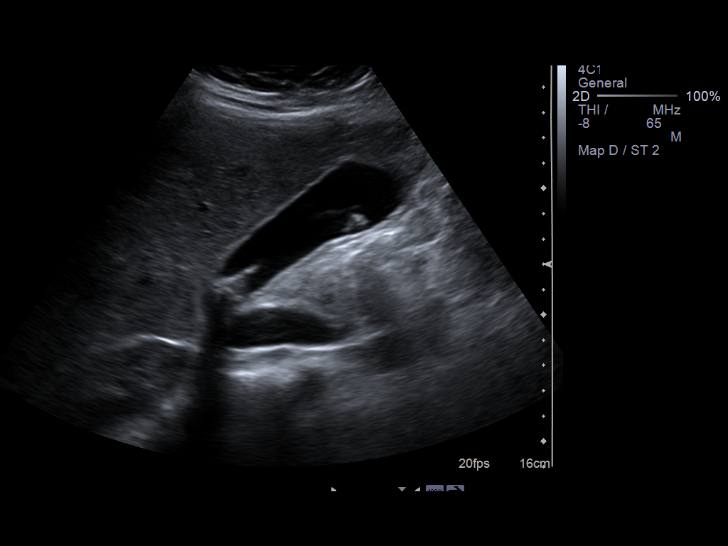
[im 48/64]
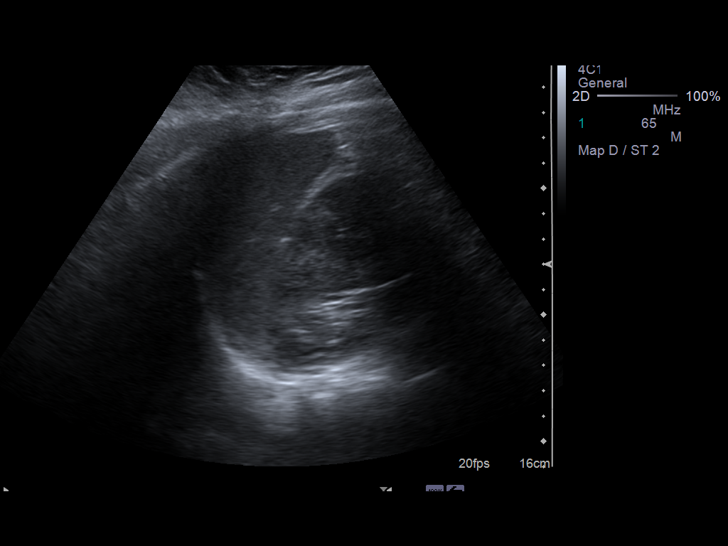
[im 53/64]
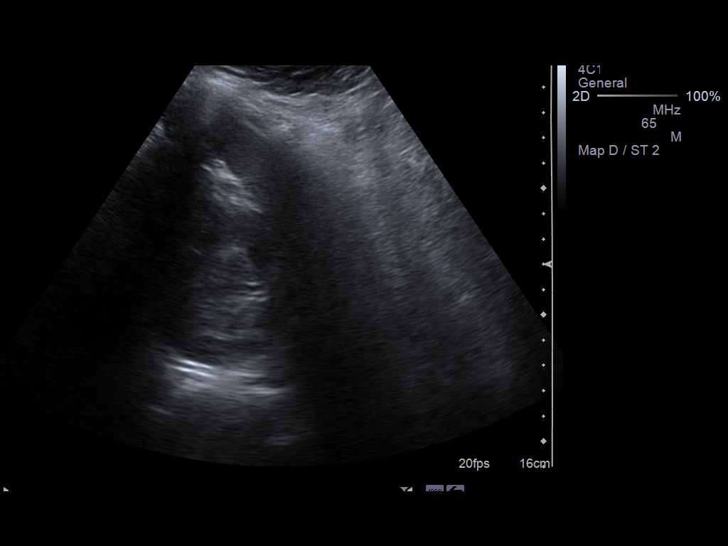
[im 58/64]
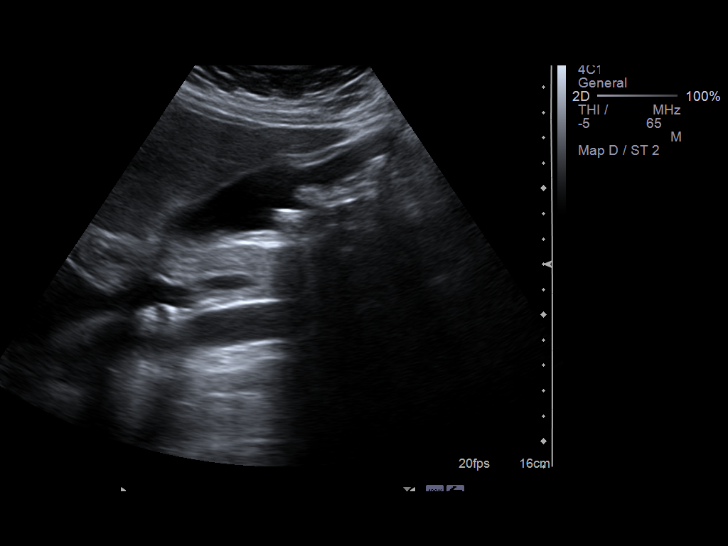
[im 64/64]
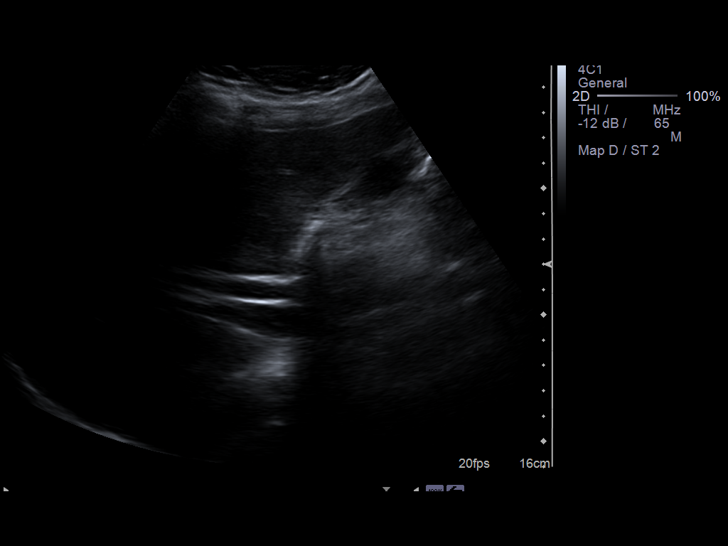

[14 of 25 positions shown; findings below may reference images not displayed]

FINDINGS: Gallbladder:  Gallstones are present.  No pericholecystic fluid or
gallbladder wall thickening.  Negative sonographic Murphy's sign.

Common bile duct:  Mildly prominent at 8 mm.  The distal duct is
obscured by overlying bowel gas artifact.

Liver:  No focal lesion identified.  Within normal limits in
parenchymal echogenicity.

IVC:  Appears normal.

Pancreas:  Incompletely imaged, no focal abnormality where seen.

Spleen:  Measures 8.9 cm in craniocaudal length.

Right Kidney:  11.5 cm in length.  No hydronephrosis or focal
lesion.

Left Kidney:  10.9 cm in length.  No hydronephrosis or focal
lesion.

Abdominal aorta:  No aneurysm identified.  Measures up to 2.2 cm in
diameter.  The distal aorta is not visualized secondary to
overlying bowel gas artifact.
IMPRESSION: Cholelithiasis without sonographic evidence for cholecystitis.

Mildly prominent common bile duct up to 8 mm.  No obstructing
lesion identified however the distal duct is obscured by overlying
bowel gas artifact.  If there is clinical concern for obstruction,
consider MRCP.

## 2010-07-17 ENCOUNTER — Other Ambulatory Visit: Payer: Self-pay | Admitting: General Surgery

## 2010-07-17 ENCOUNTER — Inpatient Hospital Stay (HOSPITAL_COMMUNITY): Payer: Medicaid Other

## 2010-07-17 LAB — COMPREHENSIVE METABOLIC PANEL
Alkaline Phosphatase: 82 U/L (ref 39–117)
BUN: 6 mg/dL (ref 6–23)
Chloride: 103 mEq/L (ref 96–112)
GFR calc non Af Amer: >60 mL/min (ref >60)
Glucose, Bld: 104 mg/dL — ABNORMAL HIGH (ref 70–99)
Potassium: 3.8 mEq/L (ref 3.5–5.1)
Total Bilirubin: 0.5 mg/dL (ref 0.3–1.2)

## 2010-07-17 LAB — CBC
HCT: 32.9 % — ABNORMAL LOW (ref 36.0–46.0)
MCH: 23.9 pg — ABNORMAL LOW (ref 26.0–34.0)
MCV: 72.9 fL — ABNORMAL LOW (ref 78.0–100.0)
RBC: 4.51 MIL/uL (ref 3.87–5.11)
WBC: 6 10*3/uL (ref 4.0–10.5)

## 2010-07-17 LAB — LIPASE, BLOOD: Lipase: 22 U/L (ref 11–59)

## 2010-07-17 IMAGING — RF DG CHOLANGIOGRAM OPERATIVE
1 series · 6 of 6 positions shown · non-contrast
Comparison: Ultrasound [DATE]

CLINICAL DATA: Cholelithiasis.

INTRAOPERATIVE CHOLANGIOGRAM

[Series 1: run · 3 acquisitions, 6 frames shown]
[im 1/3]
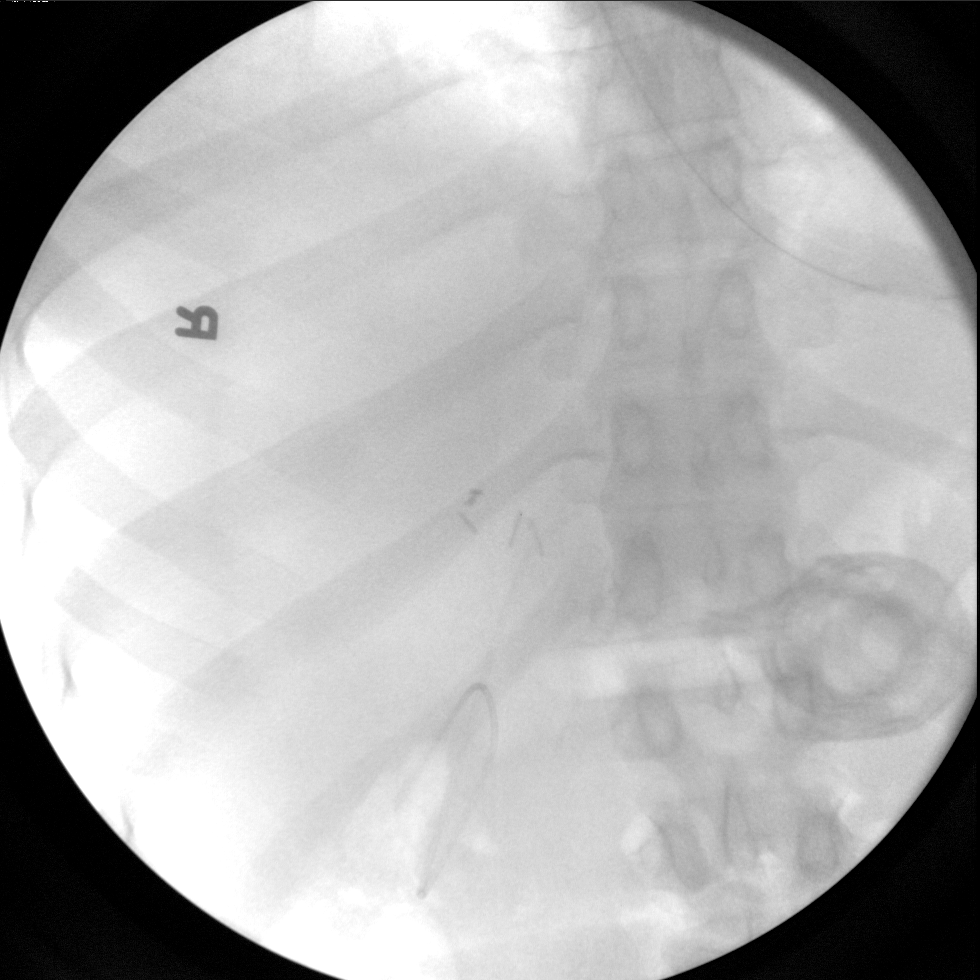
[im 1/3]
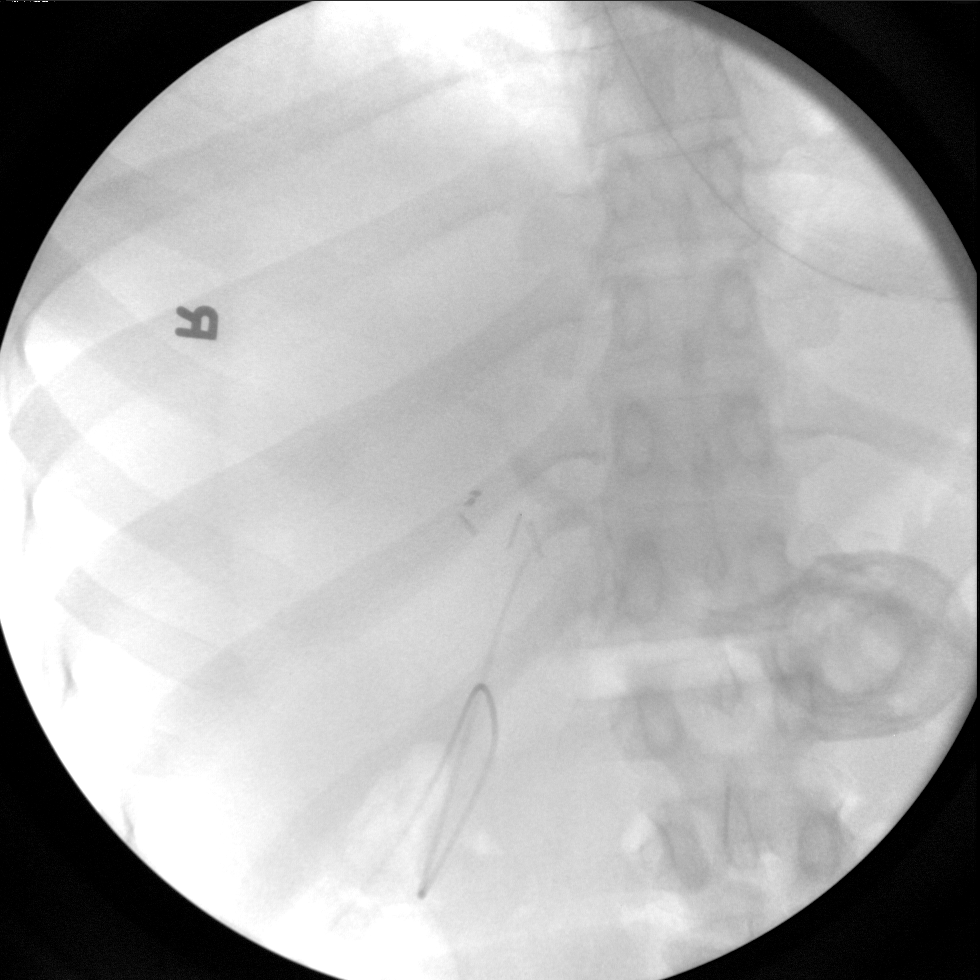
[im 1/3]
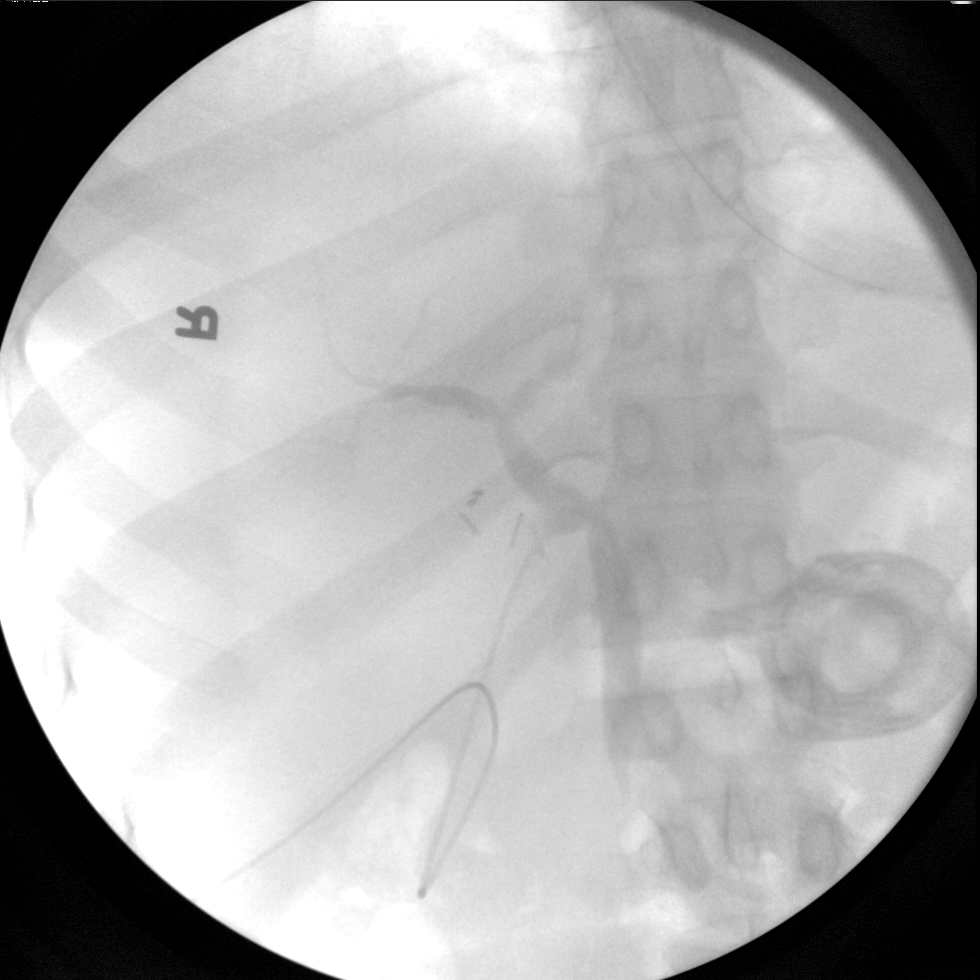
[im 1/3]
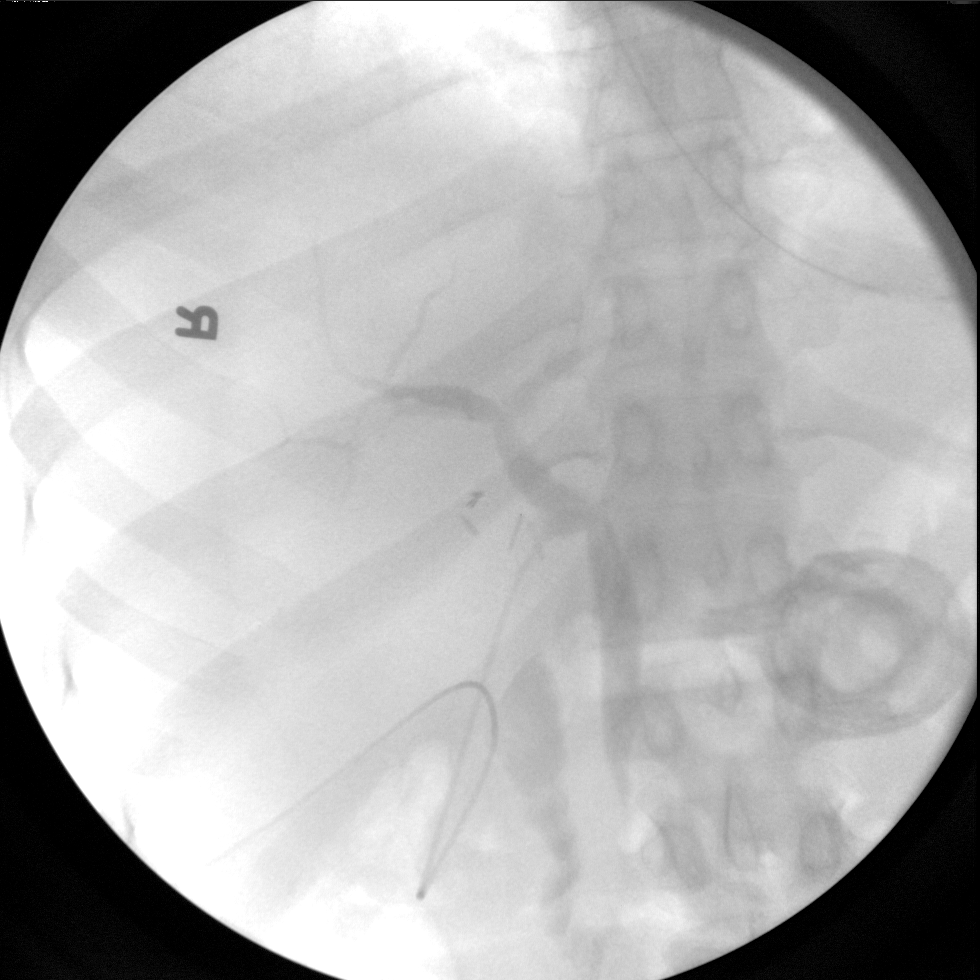
[im 2/3]
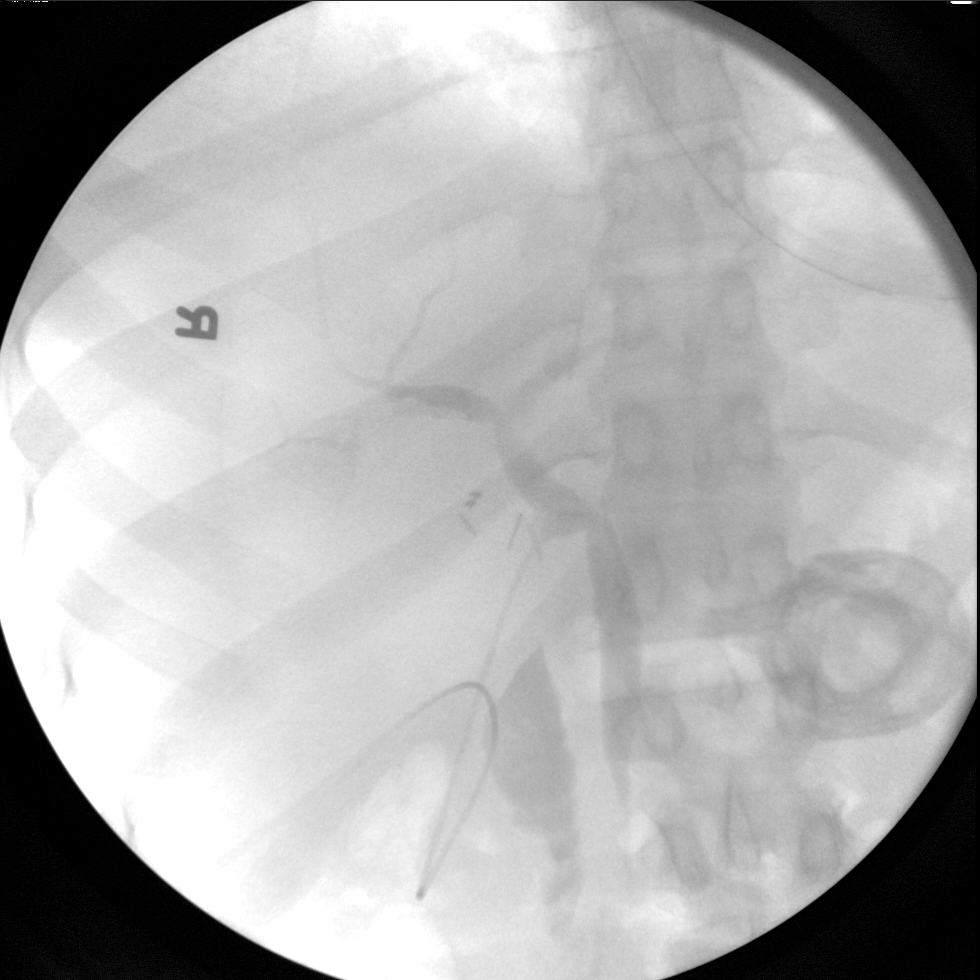
[im 3/3]
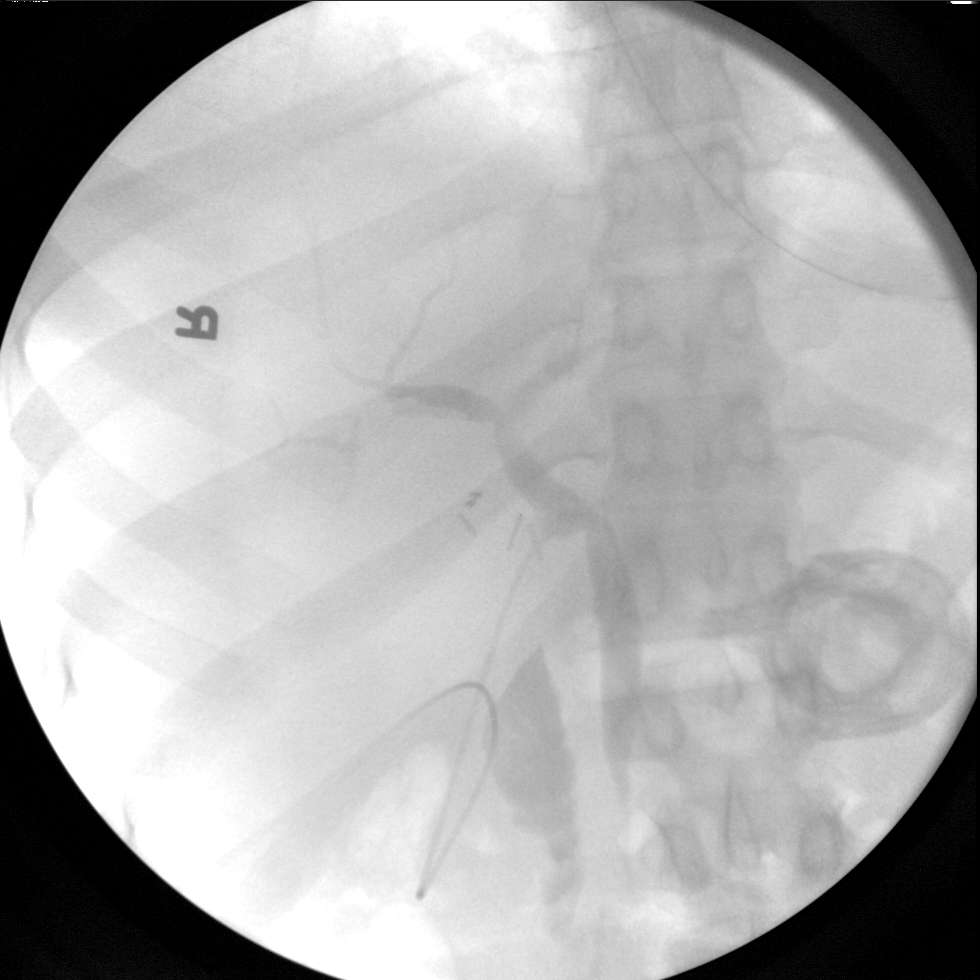

[6 of 6 positions shown; findings below may reference images not displayed]

FINDINGS: Intraoperative spot images show normal caliber biliary
system.  No evidence of retained stone or obstruction.  Free
passage of contrast into the small bowel noted.
IMPRESSION: No evidence of retained stone or obstruction.

These images were submitted for radiologic interpretation only.
Please see the procedural report for the amount of contrast and the
fluoroscopy time utilized.

## 2010-07-19 HISTORY — PX: CHOLECYSTECTOMY: SHX55

## 2010-07-23 ENCOUNTER — Encounter: Payer: Self-pay | Admitting: Family Medicine

## 2010-07-23 ENCOUNTER — Telehealth: Payer: Self-pay | Admitting: Family Medicine

## 2010-07-23 NOTE — Telephone Encounter (Signed)
Called to follow up with pt s/p cholecystectomy. Pt states that she symptomatically feels much better. Has been working hard to cut back on her fatty foods. Is planning to follow up with Surgery in 2-4 weeks. Planning to follow up with me in approx 1-2 months. Told pt to call us if she ahd any questions.  Pt appreciable.

## 2010-07-29 NOTE — Op Note (Signed)
NAMEJASMA, SEEVERS            ACCOUNT NO.:  [id number]  MEDICAL RECORD NO.:  [id number]  LOCATION:  5157                         FACILITY:  [facility]  PHYSICIAN:  Cherylynn Ridges, M.D.    DATE OF BIRTH:  [birth year]  DATE OF PROCEDURE:  07/17/2010 DATE OF DISCHARGE:                              OPERATIVE REPORT   PREOPERATIVE DIAGNOSIS:  Cholelithiasis and acute cholecystitis.  POSTOPERATIVE DIAGNOSIS:  Cholelithiasis and acute cholecystitis with a normal intraoperative cholangiogram.  PROCEDURE:  Laparoscopic cholecystectomy with cholangiogram.  SURGEON:  Cherylynn Ridges, MD.  ASSISTANT:  Letha Cape, PA.  ANESTHESIA:  General endotracheal.  ESTIMATED BLOOD LOSS:  Less than 30 mL.  COMPLICATIONS:  None.  CONDITION:  Stable.  FINDINGS:  An edematous acutely inflamed gallbladder with normal intraoperative cholangiogram.  INDICATIONS FOR OPERATION:  The patient is a 25 year old with abdominal pain and ultrasound demonstrating cholelithiasis.  Ultrasound did not show evidence of cholecystitis, normal LFTs.  She now comes in for laparoscopic cholecystectomy.  OPERATION:  The patient was taken to the operating room, placed on table in supine position.  After an adequate general endotracheal anesthetic was administered, she was prepped and draped in usual sterile manner, exposing entire abdomen.  After proper time-out was performed, identifying the patient and procedure to be performed.  A supraumbilical midline incision was made using #15 blade and taken down to the midline fascia.  We grabbed the fascia with Kocher clamp x2 and then incised in the fascia between the clamps using a #15 blade.  We grabbed the edges of the incised fascia and tented up as we bluntly dissected down into the peritoneal cavity with Kelly clamp.  Once this was done, a pursestring suture of 0 Vicryl was passed around the fascial opening, which secured and Hassan cannula which was subsequently  passed.  Through the [facility] cannula, carbon dioxide gas was insufflated up to maximal intra-abdominal pressure of 50 mmHg.  With this being done, the patient was placed in a reverse Trendelenburg and left-side was tilted down.  Two right costal margin, 5-mm cannulas, and a subxiphoid initially 5-mm, which was converted to a 10/11 mm cannula, and a subxiphoid area were passed under direct vision.  A grasper was passed through the lateral- most cannula and retract the gallbladder towards the anterior abdominal wall and right upper quadrant.  A second grasper was passed onto the infundibulum, which retracted [place] against the dissection of the edematous planes.  We dissected out the peritoneum overlying the triangle of Fallot and hepatic duodenal triangle, isolating what again appeared to be a large cystic duct going into the gallbladder.  We circumferentially dissected out the cystic duct, and once we had an isolated, we clipped the proximal sidewall and gallbladder wall and made cholecystodochotomy, then performed a cholangiogram using a Cook catheter which had been passed through the anterior abdominal wall.  This flowed easily and the cholangiogram showed good flow into the duodenum, good proximal filling, no intraductal filling defects, and no dilatation.  The catheter was subsequently removed.  We clipped the cystic duct distally x3 entirely across the wall, then transected the cystic duct.  We then dissected out the gallbladder from its bed with  minimal difficulty, retrieving it from the supraumbilical site using an EndoCatch bag.  Once this was done, we inspected the bed for hemostasis.  Cauterization was used liberally, but we only required a liter saline to irrigate out the area.  Once this was done, we closed the supraumbilical fascial site using a pursestring suture, which was in place.  We aspirated all fluid and gas from above the liver.  We removed all cannulas.  A 0.25%  Marcaine with epi was injected at all sites.  The supraumbilical and subxiphoid skin sites were closed using running subcuticular stitch of 4-0 Monocryl.  Steri-Strips, Dermabond, and Tegaderm used to complete all dressing.  All needle count, sponge counts, and instrument counts were correct.     Cherylynn Ridges, M.D.     JOW/MEDQ  D:  07/17/2010  T:  07/18/2010  Job:  [id number]  Electronically Signed by Jimmye Norman M.D. on 07/29/2010 08:09:42 AM

## 2010-08-05 ENCOUNTER — Ambulatory Visit (INDEPENDENT_AMBULATORY_CARE_PROVIDER_SITE_OTHER): Payer: Medicaid Other | Admitting: Radiology

## 2010-08-05 ENCOUNTER — Encounter (INDEPENDENT_AMBULATORY_CARE_PROVIDER_SITE_OTHER): Payer: Self-pay | Admitting: Radiology

## 2010-08-05 DIAGNOSIS — K810 Acute cholecystitis: Secondary | ICD-10-CM

## 2010-08-05 NOTE — Progress Notes (Signed)
Catherine Simmons 25 y.o. female had a laparoscopic cholecystectomy with intraoperative cholangiogram on 07/16/10.  The pathology report confirmed chronic cholecystitis.  The patient reports that they are feeling well with normal bowel movements and good appetite. She is using the Miralax to maintain normal bowel movements. She reports some soreness on her right side but it seems to be getting better. The pre-operative symptoms of abdominal pain, nausea, and vomiting have resolved.    Physical examination - Incisions appear well-healed with no sign of infection or bleeding.   Abdomen - soft, non-tender.  Impression:  Chronic cholecystitis s/p laparoscopic cholecystectomy  Plan:  Resume regular diet and full activity.  Follow-up on a PRN basis.

## 2010-08-12 NOTE — H&P (Signed)
Catherine Simmons, Catherine Simmons            ACCOUNT NO.:  [id number]  MEDICAL RECORD NO.:  [id number]  LOCATION:  [facility]                         FACILITY:  [facility]  PHYSICIAN:  Cherylynn Ridges, M.D.    DATE OF BIRTH:  05-12-1985  DATE OF ADMISSION:  07/16/2010 DATE OF DISCHARGE:                             HISTORY & PHYSICAL   CHIEF COMPLAINT:  Abdominal pain, right upper quadrant.  BRIEF HISTORY:  The patient is a 25 year old [place] American female who presents with right upper quadrant pain.  She said this episode of pain started on Monday, it has been ongoing and became progressively worse. She subsequently came to the ER this morning.  The patient relates she has had pain since January after her birth of her second child.  She says it comes and goes usually in the early a.m. while she is in bed asleep.  It gets better normally with a heating pad and is fine in the morning.  She says oral intake including high fatty foods to not reproduce her pain.  Again it normally comes on at night or early a.m. while asleep.  She was seen in January with complaints of chest and abdominal pain.  Workup at that time was negative.  She reports pain that started Monday has been ongoing, it has become progressively worse. She has had a couple episodes of nausea, but no vomiting except for one time on Monday and that is her first episode of nausea and vomiting. She normally gets better in 3 or 4 hours, but this has been ongoing. She also had some diarrhea early Monday, but none since.  She feels better standing or leaning over.  She has been treated with Zofran and Dilaudid here in the ER initially and she said she felt better, but started crying, complained of pain during the interview.  PAST MEDICAL HISTORY: 1. She has a history of Chlamydia, gonorrhea, and genital warts.  She     has a history of depression.  She took herself off medicines last     year.  She has sickle cell trait, but no history of  sickling in the     past.  She had an episode of shortness of breath and chest     discomfort on February 16, 2010, this was accompanied by abdominal     pain.  She got a full workup including a CT scan of the chest which     was negative for PE. 2. History of hypertension. 3. BMI of 39, height is 62 inches, last weight taken was 215.  PAST SURGICAL HISTORY:  She had a right-sided buttocks abscess which was I and D in the past.  FAMILY HISTORY:  Father is living, but she does not know him.  Mother is living with high blood pressure, diabetes, and apparently a benign brain tumor.  One brother in good health.  Two sisters one of whom has asthma, the other is in good health.  SOCIAL HISTORY:  She smoked for 9 years less than a pack a day, it sounds like only a couple cigarettes a day.  Alcohol:  Social.  Drugs: She has tried marijuana in the past.  She has a  68-year-old and 63-month- old at home.  She has had intercourse since her child birth and recently.  She is unemployed and lives with her parents and her two children.  REVIEW OF SYSTEMS:  Fever none.  She thinks she had some chills on Monday.  Skin:  No changes.  Weight is stable since January.  CV: Positive for headache, less frequent than when she was pregnant.  She also has occasional vision changes which she describes as black spots. Pulmonary:  Negative for orthopnea, PND.  She does wake up at night occasionally short of breath.  She gets short of breath with her abdominal pain.  She denies coughing or wheezing, dyspnea on exertion. CARDIAC:  She had chest discomfort with her episode in January and had a workup at which time PE was ruled out.  CKs were negative.  GI: Positive for GERD.  No nausea or vomiting until Monday.  She had some diarrhea on Monday.  It sounds like she has problems with constipation. She actually took some MiraLax on Monday also.  GU:  She denies any odor discomfort voiding.  Lower Extremities:  No edema,  claudication.  GYN: She does note chronic pain with each.  ALLERGIES:  None.  PHYSICAL EXAMINATION:  GENERAL:  This is an overweight African American female, crying at the end of the interview with abdominal pain.  She also having discomfort because of needing to go to the bathroom.  Of note at the end of the interview and exam, she walked to the bathroom without any apparent discomfort. VITAL SIGNS:  Temperature is 98.6, heart rate is 72, blood pressure 175/99, respiratory rate is 21 on room air.  Sats are normal. HEAD:  Normocephalic. EYES, EARS, NOSE, THROAT, AND MOUTH:  All within normal limits. NECK:  Trachea is in the midline.  No bruits.  No palpable thyromegaly. No JVD. CARDIAC:  Normal S1 and S2.  No murmurs or rubs. ABDOMEN:  Soft.  She is tender in the right upper quadrant.  She is actually tearful before I start the exam.  There is no abscesses, hernia or masses noted. GU/RECTAL:  Deferred. LYMPHADENOPATHY:  None palpated cervical, axillary or femoral. PSYCH:  Very anxious. NEUROLOGIC:  No focal changes.  Cranial nerves II through XII are intact.  Gait appears normal.  LABORATORY DATA:  White count is 7.7, hemoglobin 11, hematocrit is 34.9, platelets 284,000.  Lipase is 29.  Sodium is 138, potassium is 4.0, chloride is 106, CO2 is 22, BUN is 15, creatinine 0.57, total bilirubin 0.2, alk phos 70, SGOT and SGPT are 12 and 14 respectively.  Labs from January 2012 showed negative CK and troponin, TSH a 1.7, and BNP of 128 preceded by 434.  DIAGNOSTICS: 1. She had a gallbladder ultrasound which showed no pericholecystic     fluid.  No Murphy's.  Common bile duct was somewhat elevated in     size at 8 mm.  The patient had cholelithiasis without     cholecystitis. 2. Transvaginal ultrasound on May 29, 2010 showed an incomplete     gallbladder distention, multiple stones.  No fluid or Murphy sign     over the gallbladder.  PE was negative.  Gallbladder ultrasound      May 12, 2010 showed incomplete gallbladder distention, multiple     stones.  No fluid or Murphy sign.  IMPRESSION: 1. Cholelithiasis with ongoing abdominal pain since January 2012, when     she was postpartum. 2. History of Chlamydia, gonorrhea, and genital  warts. 3. Ovarian cyst on the right with chronic pain each menstrual cycle. 4. Workup for shortness of breath, abdominal pain January 2012. 5. Hypertension. 6. Sleep apnea. 7. BMI of 39.  PLAN:  We are going to check her UA, urine pregnancy, urine for gonorrhea or Chlamydia.  We will plan to admit her and discuss elective cholecystectomy in the a.m.  We will make her n.p.o. after midnight, clear liquids today.  Antibiotic and nausea medicines.     Eber Hong, P.A.   ______________________________ Cherylynn Ridges, M.D.    WDJ/MEDQ  D:  07/16/2010  T:  07/16/2010  Job:  [id number]  cc:   Doree Albee, MD  Electronically Signed by Sherrie George P.A. on 07/29/2010 06:58:11 PM Electronically Signed by Jimmye Norman M.D. on 08/12/2010 08:14:49 AM

## 2010-09-16 ENCOUNTER — Emergency Department (HOSPITAL_COMMUNITY)
Admission: EM | Admit: 2010-09-16 | Discharge: 2010-09-16 | Disposition: A | Discharge status: Home / Self Care | Payer: Medicaid Other | Attending: Emergency Medicine | Admitting: Emergency Medicine

## 2010-09-16 DIAGNOSIS — R51 Headache: Secondary | ICD-10-CM | POA: Insufficient documentation

## 2010-09-16 DIAGNOSIS — I10 Essential (primary) hypertension: Secondary | ICD-10-CM | POA: Insufficient documentation

## 2010-09-16 DIAGNOSIS — R112 Nausea with vomiting, unspecified: Secondary | ICD-10-CM | POA: Insufficient documentation

## 2010-10-09 ENCOUNTER — Ambulatory Visit: Payer: Medicaid Other | Admitting: Family Medicine

## 2010-10-10 ENCOUNTER — Encounter: Payer: Self-pay | Admitting: Family Medicine

## 2010-10-10 ENCOUNTER — Ambulatory Visit (INDEPENDENT_AMBULATORY_CARE_PROVIDER_SITE_OTHER): Payer: Medicaid Other | Admitting: Family Medicine

## 2010-10-10 VITALS — BP 148/95 | HR 75 | Ht 62.0 in | Wt 243.1 lb

## 2010-10-10 DIAGNOSIS — A599 Trichomoniasis, unspecified: Secondary | ICD-10-CM

## 2010-10-10 DIAGNOSIS — A499 Bacterial infection, unspecified: Secondary | ICD-10-CM

## 2010-10-10 DIAGNOSIS — N760 Acute vaginitis: Secondary | ICD-10-CM

## 2010-10-10 DIAGNOSIS — R109 Unspecified abdominal pain: Secondary | ICD-10-CM

## 2010-10-10 DIAGNOSIS — B9689 Other specified bacterial agents as the cause of diseases classified elsewhere: Secondary | ICD-10-CM

## 2010-10-10 LAB — POCT URINALYSIS DIPSTICK
Nitrite, UA: NEGATIVE
Protein, UA: NEGATIVE
Urobilinogen, UA: 0.2
pH, UA: 6

## 2010-10-10 MED ORDER — METRONIDAZOLE 500 MG PO TABS: 500.0000 mg | ORAL_TABLET | Freq: Three times a day (TID) | ORAL | Status: AC

## 2010-10-10 NOTE — Progress Notes (Signed)
  Subjective:    Patient ID: Catherine Simmons, female    DOB: 10-10-1985, 25 y.o.   MRN: [id number]  HPI 1. Vaginal discharge One week fo vaginal discharge. Had unprotected sex 2 weeks ago. History of GC/CL/Trich. Foul smelling yellow-white discharge. Examined pt. With speculum and sent samples for gc/cl/wet prep. Examined under microscope - few trichomonas seen and many clue cells and wbc's. Urinalysis had LE. No dysuria. No fevers. No arthritis/joint swelling.    Review of Systems  Constitutional: Negative for fever.  Cardiovascular: Negative for leg swelling.  Gastrointestinal: Positive for abdominal pain. Negative for nausea.  Genitourinary: Positive for vaginal discharge. Negative for dysuria, urgency, hematuria, flank pain, vaginal bleeding, difficulty urinating, genital sores and vaginal pain.  Musculoskeletal: Negative for joint swelling and arthralgias.  Skin: Negative for rash.       Objective:   Physical Exam  Nursing note and vitals reviewed. Genitourinary: There is no rash, tenderness, lesion or injury on the right labia. There is no rash, tenderness, lesion or injury on the left labia. No erythema, tenderness or bleeding around the vagina. No foreign body around the vagina. No signs of injury around the vagina. Vaginal discharge found.       Yellowish- white discharge, foul smelling      Assessment & Plan:  1. Bacterial Vaginosis, few Trichomonas seen - will treat with 7 days of Flagyl 500mg  tabs TID for recurrent Trich and extensive BV - advised to inform partner.

## 2010-10-15 NOTE — Progress Notes (Signed)
Visit diagnosis should include Trichomonas vaginitis

## 2010-10-17 ENCOUNTER — Ambulatory Visit (INDEPENDENT_AMBULATORY_CARE_PROVIDER_SITE_OTHER): Payer: Medicaid Other | Admitting: Family Medicine

## 2010-10-17 ENCOUNTER — Encounter: Payer: Self-pay | Admitting: Family Medicine

## 2010-10-17 VITALS — BP 143/95 | HR 82 | Temp 98.4°F | Ht 62.0 in | Wt 239.7 lb

## 2010-10-17 DIAGNOSIS — R109 Unspecified abdominal pain: Secondary | ICD-10-CM

## 2010-10-17 DIAGNOSIS — N730 Acute parametritis and pelvic cellulitis: Secondary | ICD-10-CM

## 2010-10-17 DIAGNOSIS — N390 Urinary tract infection, site not specified: Secondary | ICD-10-CM | POA: Insufficient documentation

## 2010-10-17 LAB — POCT URINALYSIS DIPSTICK
Ketones, UA: NEGATIVE
Protein, UA: 30
Spec Grav, UA: 1.015

## 2010-10-17 MED ORDER — CEFIXIME 400 MG PO TABS: 400.0000 mg | ORAL_TABLET | Freq: Two times a day (BID) | ORAL | Status: DC

## 2010-10-17 MED ORDER — CEFTRIAXONE SODIUM 1 G IJ SOLR
1.0000 g | Freq: Once | INTRAMUSCULAR | Status: AC
  Administered 2010-10-17: 1 g via INTRAMUSCULAR

## 2010-10-17 MED ORDER — CEFIXIME 400 MG PO TABS: 400.0000 mg | ORAL_TABLET | Freq: Every day | ORAL | Status: AC

## 2010-10-17 MED ORDER — AZITHROMYCIN 1 G PO PACK
1.0000 g | PACK | Freq: Once | ORAL | Status: AC
  Administered 2010-10-17: 1 g via ORAL

## 2010-10-17 MED ORDER — CEFTRIAXONE SODIUM 500 MG IJ SOLR: 500.0000 mg | Freq: Once | INTRAMUSCULAR | Status: DC

## 2010-10-17 NOTE — Assessment & Plan Note (Signed)
See abd pain assessment.

## 2010-10-17 NOTE — Assessment & Plan Note (Addendum)
+   Leukocytes on UA. 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 Will treat for UTI and PID. Will culture urine. GC/Chl also obtained. Case reviewed with Dr. Raymondo Band. Rocephin and Azithro x 1 in clinic. Will start pt on suprax(3rd gen oral cephalosporin) for combined anaerobic and gram negative coverage in setting of UTI. Will follow up in 1 week.

## 2010-10-17 NOTE — Assessment & Plan Note (Signed)
See abdominal pain assessment.

## 2010-10-17 NOTE — Patient Instructions (Signed)
Urinary Tract Infection (UTI) Infections of the urinary tract can start in several places. A bladder infection (cystitis), a kidney infection (pyelonephritis), and a prostate infection (prostatitis) are different types of urinary tract infections. They usually get better if treated with medicines (antibiotics) that kill germs. Take all the medicine until it is gone. You or your child may feel better in a few days, but TAKE ALL MEDICINE or the infection may not respond and may become more difficult to treat. HOME CARE INSTRUCTIONS  Drink enough water and fluids to keep the urine clear or pale yellow. Cranberry juice is especially recommended, in addition to large amounts of water.   Avoid caffeine, tea, and carbonated beverages. They tend to irritate the bladder.   Alcohol may irritate the prostate.   Only take over-the-counter or prescription medicines for pain, discomfort, or fever as directed by your caregiver.  FINDING OUT THE RESULTS OF YOUR TEST Not all test results are available during your visit. If your or your child's test results are not back during the visit, make an appointment with your caregiver to find out the results. Do not assume everything is normal if you have not heard from your caregiver or the medical facility. It is important for you to follow up on all test results. TO PREVENT FURTHER INFECTIONS:  Empty the bladder often. Avoid holding urine for long periods of time.   After a bowel movement, women should cleanse from front to back. Use each tissue only once.   Empty the bladder before and after sexual intercourse.  SEEK MEDICAL CARE IF:  There is back pain.   You or your child has an oral temperature above 100.5.   Your baby is older than 3 months with a rectal temperature of 100.5 F (38.1 C) or higher for more than 1 day.   Your or your child's problems (symptoms) are no better in 3 days. Return sooner if you or your child is getting worse.  SEEK IMMEDIATE  MEDICAL CARE IF:  There is severe back pain or lower abdominal pain.   You or your child develops chills.   You or your child has an oral temperature above 100.5, not controlled by medicine.   Your baby is older than 3 months with a rectal temperature of 102 F (38.9 C) or higher.   Your baby is 61 months old or younger with a rectal temperature of 100.4 F (38 C) or higher.   There is nausea or vomiting.   There is continued burning or discomfort with urination.  MAKE SURE YOU:  Understand these instructions.   Will watch this condition.   Will get help right away if you or your child is not doing well or gets worse.  Document Released: 11/05/2004 Document Re-Released: 04/22/2009 [facility] Patient Information 2011 [place], [place].Pelvic Inflammatory Disease (PID) Pelvic inflammatory disease (PID) is an infection of some, or all, of the female pelvic organs. PID is caused by viruses, fungi, parasites or most often by germs (bacteria). HOME CARE  Take all medicine as told by your doctor, especially the ones that kill germs (antibiotics).   Only take over-the-counter medicine for pain and discomfort as told by your doctor.   Do not have sex until the infection is gone. Do so only after your doctor says it is okay.   Tell your sex partner you have PID. He or she may need treatment. It may be a sexually transmitted disease (STD).   Keep your follow-up appointments.  GET HELP  IF:  You have a temperature by mouth above 100.5.   You have an increase in belly pain.   You start to get the chills.   You have pain when you pee (urinate).   You are not better after 72 hours.   Your sex partner tells you he or she has an STD.   You are throwing up (vomiting).   You cannot take your medicines.  MAKE SURE YOU:  Understand these instructions.   Will watch your condition.   Will get help right away if you are not doing well or get worse.  Document Released: 04/24/2008  Document Re-Released: 07/16/2009 [facility] Patient Information 2011 [place], [place].

## 2010-10-17 NOTE — Progress Notes (Signed)
  Subjective:    Patient ID: Catherine Simmons, female    DOB: 09-Nov-1985, 25 y.o.   MRN: [id number]  HPI L sided flank pain x 6 days. Worse with movement and deep breathing.  + Nausea, but no vmiting. + Dysuria. Pt states that she had a temp 104 at home 2 days ago. Also assd with HA and deep breathing. Prior hx/o PID. No vaginal discharge. Currently menstruating. Last sexual activity was 2 months ago per pt. Pt also recently treated for BV and trichamoniasis 10/10/10. Finished flagyl 2 days ago.    Review of Systems See HPI     Objective:   Physical Exam Gen: in bed, in minimal distress CV: RRR PULM: CTAB ABD: +LLQ TTP/+ L flank pain  GU: normal external genitalia, visible blood in cervical os, + CMT on bimanual exam.    Assessment & Plan:

## 2010-10-18 LAB — GC/CHLAMYDIA PROBE AMP, GENITAL
Chlamydia, DNA Probe: NEGATIVE
GC Probe Amp, Genital: NEGATIVE

## 2010-10-20 ENCOUNTER — Encounter: Payer: Self-pay | Admitting: Family Medicine

## 2010-10-20 LAB — CULTURE, URINE COMPREHENSIVE

## 2010-10-29 ENCOUNTER — Ambulatory Visit: Payer: Medicaid Other | Admitting: Family Medicine

## 2011-02-12 ENCOUNTER — Inpatient Hospital Stay (HOSPITAL_COMMUNITY)
Admission: AD | Admit: 2011-02-12 | Discharge: 2011-02-12 | Disposition: A | Discharge status: Home / Self Care | Payer: Medicaid Other | Source: Ambulatory Visit | Attending: Obstetrics & Gynecology | Admitting: Obstetrics & Gynecology

## 2011-02-12 DIAGNOSIS — R109 Unspecified abdominal pain: Secondary | ICD-10-CM | POA: Insufficient documentation

## 2011-02-12 DIAGNOSIS — A499 Bacterial infection, unspecified: Secondary | ICD-10-CM | POA: Insufficient documentation

## 2011-02-12 DIAGNOSIS — B9689 Other specified bacterial agents as the cause of diseases classified elsewhere: Secondary | ICD-10-CM | POA: Insufficient documentation

## 2011-02-12 DIAGNOSIS — K529 Noninfective gastroenteritis and colitis, unspecified: Secondary | ICD-10-CM

## 2011-02-12 DIAGNOSIS — N760 Acute vaginitis: Secondary | ICD-10-CM | POA: Insufficient documentation

## 2011-02-12 DIAGNOSIS — A088 Other specified intestinal infections: Secondary | ICD-10-CM | POA: Insufficient documentation

## 2011-02-12 LAB — WET PREP, GENITAL

## 2011-02-12 LAB — URINALYSIS, ROUTINE W REFLEX MICROSCOPIC
Bilirubin Urine: NEGATIVE
Ketones, ur: NEGATIVE mg/dL
Nitrite: NEGATIVE
Specific Gravity, Urine: 1.025 (ref 1.005–1.030)
Urobilinogen, UA: 0.2 mg/dL (ref 0.0–1.0)

## 2011-02-12 LAB — URINE MICROSCOPIC-ADD ON

## 2011-02-12 MED ORDER — METRONIDAZOLE 500 MG PO TABS: 500.0000 mg | ORAL_TABLET | Freq: Two times a day (BID) | ORAL | Status: DC

## 2011-02-12 MED ORDER — DIPHENOXYLATE-ATROPINE 2.5-0.025 MG PO TABS: 1.0000 | ORAL_TABLET | Freq: Four times a day (QID) | ORAL | Status: AC | PRN

## 2011-02-12 MED ORDER — GI COCKTAIL ~~LOC~~
30.0000 mL | Freq: Once | ORAL | Status: AC
  Administered 2011-02-12: 30 mL via ORAL
  Filled 2011-02-12: qty 30

## 2011-02-12 MED ORDER — ONDANSETRON 8 MG PO TBDP
8.0000 mg | ORAL_TABLET | Freq: Once | ORAL | Status: AC
  Administered 2011-02-12: 8 mg via ORAL
  Filled 2011-02-12: qty 1

## 2011-02-12 MED ORDER — PROMETHAZINE HCL 25 MG PO TABS: 25.0000 mg | ORAL_TABLET | Freq: Four times a day (QID) | ORAL | Status: DC | PRN

## 2011-02-12 NOTE — Progress Notes (Signed)
PT RE-ASSESSED    SAYS UPPER/ LOWER ABD  PAIN- 8.   HAS SOME SPOTTING.  BACK TO LOBBY

## 2011-02-12 NOTE — Progress Notes (Signed)
Patient states she has been having lower abdominal pain for 4 days. Missed her November period. Has had some diarrhea today, no vomiting today. Has a white discharge.

## 2011-02-12 NOTE — ED Provider Notes (Signed)
History     Chief Complaint  Patient presents with  . Abdominal Pain   HPI Generalized, sharp abd pain x 3 days, vomiting at night x 3 days, diarrhea x 2 days. Requesting STD test.     Past Medical History  Diagnosis Date  . Cholecystitis 07/16/10    Past Surgical History  Procedure Date  . Cholecystectomy     No family history on file.  History  Substance Use Topics  . Smoking status: Current Some Day Smoker    Types: Cigars  . Smokeless tobacco: Never Used  . Alcohol Use: No    Allergies:  Allergies  Allergen Reactions  . Butorphanol Tartrate     REACTION: Itching    Prescriptions prior to admission  Medication Sig Dispense Refill  . acetaminophen (TYLENOL) 500 MG tablet Take 500 mg by mouth every 6 (six) hours as needed. Stomach pains       . amLODipine (NORVASC) 10 MG tablet Take 1 tablet (10 mg total) by mouth daily.  30 tablet  6  . hydrochlorothiazide 25 MG tablet Take 1 tablet (25 mg total) by mouth daily.  30 tablet  6  . norgestimate-ethinyl estradiol (ORTHO-CYCLEN) 0.25-35 MG-MCG per tablet Take 1 tablet by mouth daily.  30 tablet  11  . polyethylene glycol (MIRALAX / GLYCOLAX) packet 1 cap in 8 ozs water tid x 10 days  100 each  2  . amoxicillin (AMOXIL) 500 MG capsule Take 1 capsule (500 mg total) by mouth 3 (three) times daily. For 10 days.  30 capsule  0  . FLUoxetine (PROZAC) 20 MG capsule Take 1 capsule (20 mg total) by mouth daily. For mood.  30 capsule  3  . DISCONTD: Prenatal Vit-Fe Sulfate-FA (PRENATAL VITAMIN PO) Take 0.8 mg by mouth daily. Dispense generic.         Review of Systems  Constitutional: Negative.   Respiratory: Negative.   Cardiovascular: Negative.   Gastrointestinal: Positive for nausea, vomiting, abdominal pain and diarrhea. Negative for constipation.  Genitourinary: Negative for dysuria, urgency, frequency, hematuria and flank pain.       Positive for vaginal discharge  Musculoskeletal: Negative.   Neurological: Negative.    Psychiatric/Behavioral: Negative.    Physical Exam   Blood pressure 140/85, pulse 90, temperature 98.5 F (36.9 C), temperature source Oral, resp. rate 18, height 5' 3.5" (1.613 m), weight 249 lb 9.6 oz (113.218 kg), last menstrual period 12/30/2010, SpO2 98.00%.  Physical Exam  Nursing note and vitals reviewed. Constitutional: She is oriented to person, place, and time. She appears well-developed and well-nourished. No distress.  HENT:  Head: Normocephalic and atraumatic.  Cardiovascular: Normal rate, regular rhythm and normal heart sounds.   Respiratory: Effort normal and breath sounds normal. No respiratory distress.  GI: Soft. Bowel sounds are normal. She exhibits no distension and no mass. There is no tenderness. There is no rebound and no guarding.  Genitourinary: There is no rash or lesion on the right labia. There is no rash or lesion on the left labia. Uterus is not deviated, not enlarged, not fixed and not tender. Cervix exhibits no motion tenderness, no discharge and no friability. Right adnexum displays no mass, no tenderness and no fullness. Left adnexum displays no mass, no tenderness and no fullness. No erythema, tenderness or bleeding around the vagina. Vaginal discharge (malodorous, white) found.  Neurological: She is alert and oriented to person, place, and time.  Skin: Skin is warm and dry.  Psychiatric: She has a normal  mood and affect.    MAU Course  Procedures  Results for orders placed during the hospital encounter of 02/12/11 (from the past 24 hour(s))  URINALYSIS, ROUTINE W REFLEX MICROSCOPIC     Status: Abnormal   Collection Time   02/12/11  5:30 PM      Component Value Range   Color, Urine YELLOW  YELLOW    APPearance CLEAR  CLEAR    Specific Gravity, Urine 1.025  1.005 - 1.030    pH 6.0  5.0 - 8.0    Glucose, UA NEGATIVE  NEGATIVE (mg/dL)   Hgb urine dipstick MODERATE (*) NEGATIVE    Bilirubin Urine NEGATIVE  NEGATIVE    Ketones, ur NEGATIVE  NEGATIVE  (mg/dL)   Protein, ur NEGATIVE  NEGATIVE (mg/dL)   Urobilinogen, UA 0.2  0.0 - 1.0 (mg/dL)   Nitrite NEGATIVE  NEGATIVE    Leukocytes, UA NEGATIVE  NEGATIVE   URINE MICROSCOPIC-ADD ON     Status: Abnormal   Collection Time   02/12/11  5:30 PM      Component Value Range   Squamous Epithelial / LPF FEW (*) RARE    WBC, UA 3-6  <3 (WBC/hpf)   RBC / HPF 0-2  <3 (RBC/hpf)   Bacteria, UA FEW (*) RARE   POCT PREGNANCY, URINE     Status: Normal   Collection Time   02/12/11  5:54 PM      Component Value Range   Preg Test, Ur NEGATIVE    WET PREP, GENITAL     Status: Abnormal   Collection Time   02/12/11 10:30 PM      Component Value Range   Yeast, Wet Prep NONE SEEN  NONE SEEN    Trich, Wet Prep NONE SEEN  NONE SEEN    Clue Cells, Wet Prep MODERATE (*) NONE SEEN    WBC, Wet Prep HPF POC FEW (*) NONE SEEN    Abd pain improved with GI cocktail, nausea improved with Zofran ODT  Assessment and Plan  26 y.o. female with BV and likely viral gastroenteritis  Medication List  As of 02/13/2011  2:17 AM   START taking these medications         diphenoxylate-atropine 2.5-0.025 MG per tablet   Commonly known as: LOMOTIL   Take 1 tablet by mouth 4 (four) times daily as needed for diarrhea or loose stools.      metroNIDAZOLE 500 MG tablet   Commonly known as: FLAGYL   Take 1 tablet (500 mg total) by mouth 2 (two) times daily.      promethazine 25 MG tablet   Commonly known as: PHENERGAN   Take 1 tablet (25 mg total) by mouth every 6 (six) hours as needed for nausea.         CONTINUE taking these medications         acetaminophen 500 MG tablet   Commonly known as: TYLENOL      amLODipine 10 MG tablet   Commonly known as: NORVASC   Take 1 tablet (10 mg total) by mouth daily.      amoxicillin 500 MG capsule   Commonly known as: AMOXIL   Take 1 capsule (500 mg total) by mouth 3 (three) times daily. For 10 days.      FLUoxetine 20 MG capsule   Commonly known as: PROZAC   Take 1 capsule  (20 mg total) by mouth daily. For mood.      hydrochlorothiazide 25 MG tablet   Commonly  known as: HYDRODIURIL   Take 1 tablet (25 mg total) by mouth daily.      norgestimate-ethinyl estradiol 0.25-35 MG-MCG tablet   Commonly known as: ORTHO-CYCLEN,SPRINTEC,PREVIFEM   Take 1 tablet by mouth daily.      polyethylene glycol packet   Commonly known as: MIRALAX / GLYCOLAX   1 cap in 8 ozs water tid x 10 days         STOP taking these medications         PRENATAL VITAMIN PO          Where to get your medications    These are the prescriptions that you need to pick up. We sent them to a specific pharmacy, so you will need to go there to get them.   [id number] - [place] ([place]), [place] - [address]    [address] [place] ([place]) [place] [address]    Phone: [phone]        metroNIDAZOLE 500 MG tablet   promethazine 25 MG tablet         You may get these medications from any pharmacy.         diphenoxylate-atropine 2.5-0.025 MG per tablet             Rashaun Wichert 02/12/2011, 9:59 PM

## 2011-02-13 LAB — GC/CHLAMYDIA PROBE AMP, GENITAL
Chlamydia, DNA Probe: NEGATIVE
GC Probe Amp, Genital: NEGATIVE

## 2011-02-13 NOTE — ED Provider Notes (Signed)
Attestation of Attending Supervision of Advanced Practitioner: Evaluation and management procedures were performed by the PA/NP/CNM/OB Fellow under my supervision/collaboration. Chart reviewed, and agree with management and plan.  Maribella Kuna, M.D. 02/13/2011 6:37 AM   

## 2011-04-22 ENCOUNTER — Ambulatory Visit (INDEPENDENT_AMBULATORY_CARE_PROVIDER_SITE_OTHER): Payer: Medicaid Other | Admitting: Family Medicine

## 2011-04-22 ENCOUNTER — Encounter: Payer: Self-pay | Admitting: Family Medicine

## 2011-04-22 VITALS — BP 160/101 | HR 69 | Temp 98.0°F | Ht 63.5 in | Wt 249.0 lb

## 2011-04-22 DIAGNOSIS — R300 Dysuria: Secondary | ICD-10-CM

## 2011-04-22 DIAGNOSIS — N898 Other specified noninflammatory disorders of vagina: Secondary | ICD-10-CM

## 2011-04-22 DIAGNOSIS — N912 Amenorrhea, unspecified: Secondary | ICD-10-CM

## 2011-04-22 DIAGNOSIS — N939 Abnormal uterine and vaginal bleeding, unspecified: Secondary | ICD-10-CM

## 2011-04-22 DIAGNOSIS — I10 Essential (primary) hypertension: Secondary | ICD-10-CM

## 2011-04-22 LAB — POCT URINALYSIS DIPSTICK
Bilirubin, UA: NEGATIVE
Ketones, UA: NEGATIVE
Protein, UA: 30
Spec Grav, UA: 1.02

## 2011-04-22 LAB — POCT UA - MICROSCOPIC ONLY

## 2011-04-22 MED ORDER — CEPHALEXIN 500 MG PO CAPS: 500.0000 mg | ORAL_CAPSULE | Freq: Three times a day (TID) | ORAL | Status: AC

## 2011-04-22 NOTE — Assessment & Plan Note (Signed)
Will check CBC and TSH today. Pt willing to give trial of scheduled NSAIDs to help with bleeding. If this is not effective over 1-2 weeks, will consider higher dose progesterone OCP.

## 2011-04-22 NOTE — Patient Instructions (Signed)
It was good to see you today  You likely have a urinary tract infection Take your antibiotics as prescribed  Take ibuprofen 800mg  3 times a day for your menstrual bleeding for 2 weeks If your bleeding is still severe, I can prescribe you a stronger birth control pill.  I am checking some blood work  Come back to see me in 2 weeks for a blood pressure follow up Call if any questions, God Bless, Doree Albee MD

## 2011-04-22 NOTE — Progress Notes (Signed)
  Subjective:    Patient ID: Catherine Simmons, female    DOB: [birth year], 26 y.o.   MRN: [id number]  HPI Pt presents today with chief complaint of breakthrough menorrhagia and dysuria. Pt states that sxs have been present for the last 4-5 days. Pt is currently on sprintec for contraception. Pt was changed to sprintec from depo-provera 05/2010 because of mennorhagia. Pt states that she has had no episodes of menorrhagia since being on sprintec. However, noticed significant bleeding after her regular menstrual cycle last week. Pt denies any strenuous activity or ASA use. Pt states that she has used up 16 pads in the last 24 hours. No significant abdominal pain. Pt had a TVUS last year that showed no uterine fibroids or endometrial polyps/hyperplasia. Pt has used ibuprofen for bleeding starting this morning.   Pt states that dysuria also started around this same time frame. Mildly increased urinary frequency. No fever, nausea, vomiting. Pt denies any vaginal discharge.    Review of Systems See HPI, otherwise ROS negative     Objective:   Physical Exam Gen: up in chair, NAD, obese HEENT: NCAT, EOMI, TMs clear bilaterally CV: RRR, no murmurs auscultated PULM: CTAB, no wheezes, rales, rhoncii ABD: S/NT/+ bowel sounds, mild suprapubic, RLQ pain EXT: 2+ peripheral pulses         Assessment & Plan:

## 2011-04-22 NOTE — Assessment & Plan Note (Addendum)
Elevated BP in setting of NSAID use. Asymptomatic. Will check Cr and K today. Will likely further tirate up BP meds. Pt agreeable to plan.

## 2011-04-22 NOTE — Assessment & Plan Note (Signed)
Will clinically treat with keflex. Will follow prn. Infectious red flags discuss.

## 2011-04-23 LAB — COMPREHENSIVE METABOLIC PANEL
ALT: 11 U/L (ref 0–35)
AST: 11 U/L (ref 0–37)
Albumin: 3.6 g/dL (ref 3.5–5.2)
Alkaline Phosphatase: 65 U/L (ref 39–117)
Calcium: 8.9 mg/dL (ref 8.4–10.5)
Chloride: 107 mEq/L (ref 96–112)
Potassium: 4.3 mEq/L (ref 3.5–5.3)
Sodium: 141 mEq/L (ref 135–145)

## 2011-04-23 LAB — CBC
MCHC: 32.6 g/dL (ref 30.0–36.0)
Platelets: 279 10*3/uL (ref 150–400)
RDW: 13.6 % (ref 11.5–15.5)
WBC: 7.1 10*3/uL (ref 4.0–10.5)

## 2011-07-28 ENCOUNTER — Emergency Department (HOSPITAL_COMMUNITY)
Admission: EM | Admit: 2011-07-28 | Discharge: 2011-07-28 | Disposition: A | Discharge status: Home / Self Care | Payer: Medicaid Other | Attending: Emergency Medicine | Admitting: Emergency Medicine

## 2011-07-28 ENCOUNTER — Emergency Department (HOSPITAL_COMMUNITY): Payer: Medicaid Other

## 2011-07-28 ENCOUNTER — Encounter (HOSPITAL_COMMUNITY): Payer: Self-pay | Admitting: Emergency Medicine

## 2011-07-28 DIAGNOSIS — N390 Urinary tract infection, site not specified: Secondary | ICD-10-CM | POA: Insufficient documentation

## 2011-07-28 DIAGNOSIS — A499 Bacterial infection, unspecified: Secondary | ICD-10-CM | POA: Insufficient documentation

## 2011-07-28 DIAGNOSIS — R1032 Left lower quadrant pain: Secondary | ICD-10-CM | POA: Insufficient documentation

## 2011-07-28 DIAGNOSIS — M549 Dorsalgia, unspecified: Secondary | ICD-10-CM | POA: Insufficient documentation

## 2011-07-28 DIAGNOSIS — N760 Acute vaginitis: Secondary | ICD-10-CM | POA: Insufficient documentation

## 2011-07-28 DIAGNOSIS — B9689 Other specified bacterial agents as the cause of diseases classified elsewhere: Secondary | ICD-10-CM | POA: Insufficient documentation

## 2011-07-28 LAB — LIPASE, BLOOD: Lipase: 18 U/L (ref 11–59)

## 2011-07-28 LAB — DIFFERENTIAL
Basophils Absolute: 0 10*3/uL (ref 0.0–0.1)
Basophils Relative: 0 % (ref 0–1)
Lymphocytes Relative: 25 % (ref 12–46)
Neutro Abs: 6.7 10*3/uL (ref 1.7–7.7)
Neutrophils Relative %: 64 % (ref 43–77)

## 2011-07-28 LAB — COMPREHENSIVE METABOLIC PANEL
ALT: 12 U/L (ref 0–35)
Alkaline Phosphatase: 80 U/L (ref 39–117)
BUN: 8 mg/dL (ref 6–23)
CO2: 24 mEq/L (ref 19–32)
Calcium: 9.1 mg/dL (ref 8.4–10.5)
GFR calc Af Amer: >90 mL/min (ref >90)
GFR calc non Af Amer: >90 mL/min (ref >90)
Glucose, Bld: 88 mg/dL (ref 70–99)
Potassium: 3.4 mEq/L — ABNORMAL LOW (ref 3.5–5.1)
Sodium: 138 mEq/L (ref 135–145)
Total Protein: 7.6 g/dL (ref 6.0–8.3)

## 2011-07-28 LAB — WET PREP, GENITAL
Trich, Wet Prep: NONE SEEN
Yeast Wet Prep HPF POC: NONE SEEN

## 2011-07-28 LAB — URINALYSIS, ROUTINE W REFLEX MICROSCOPIC
Bilirubin Urine: NEGATIVE
Glucose, UA: NEGATIVE mg/dL
Protein, ur: 30 mg/dL — AB
Urobilinogen, UA: 1 mg/dL (ref 0.0–1.0)

## 2011-07-28 LAB — CBC
HCT: 33.5 % — ABNORMAL LOW (ref 36.0–46.0)
MCHC: 33.7 g/dL (ref 30.0–36.0)
RDW: 12.8 % (ref 11.5–15.5)
WBC: 10.4 10*3/uL (ref 4.0–10.5)

## 2011-07-28 IMAGING — CT CT ABD-PELV W/O CM
3 of 4 series · 16 of 46 positions shown, 20 images · non-contrast
Comparison: None.

CLINICAL DATA: Flank pain.  UTI.  Left lower quadrant pain

CT ABDOMEN AND PELVIS WITHOUT CONTRAST
TECHNIQUE: Multidetector CT imaging of the abdomen and pelvis was
performed following the standard protocol without intravenous
contrast.

[Series 2: — · axial · 0.86mm/px · z∈[-446,-46]mm · 12 of 92 slices shown, 16 images]
[im 8/92  soft-tissue]
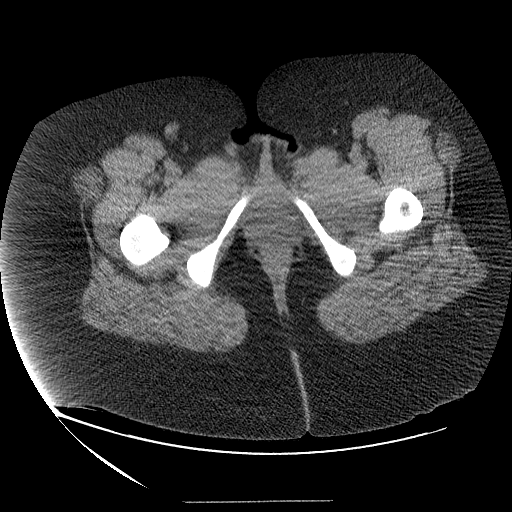
[im 8/92  bone]
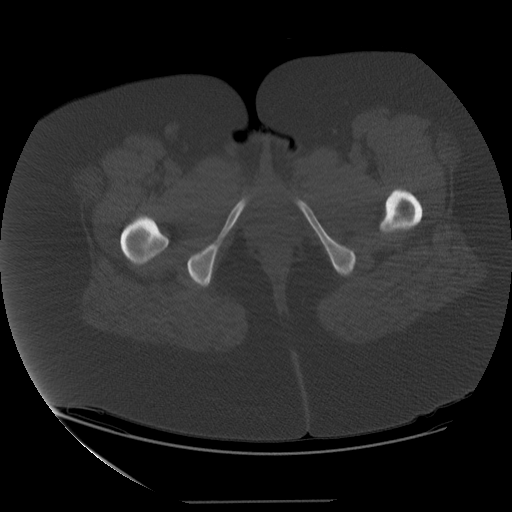
[im 16/92  soft-tissue]
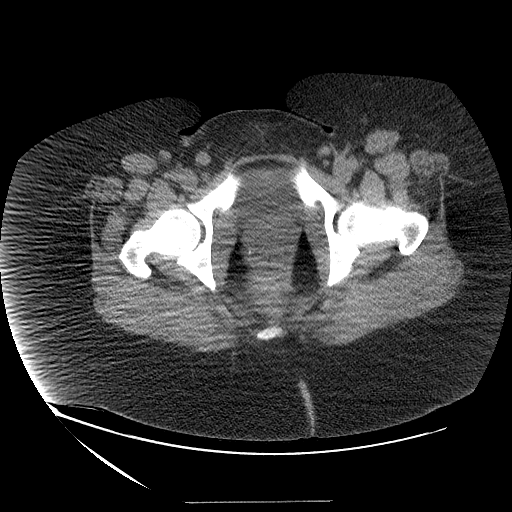
[im 24/92  soft-tissue]
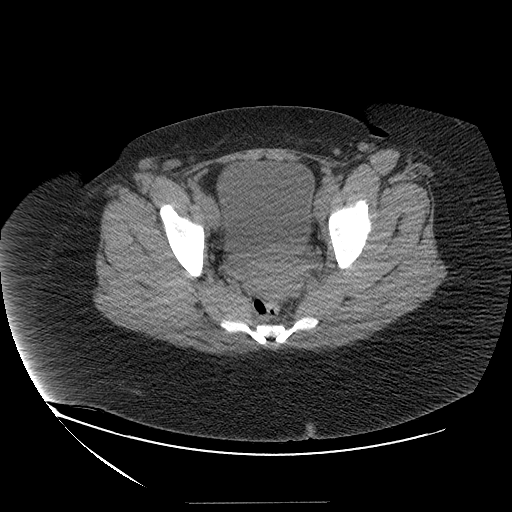
[im 32/92  soft-tissue]
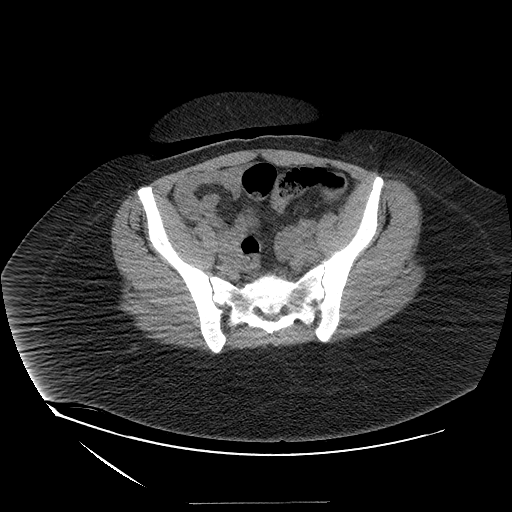
[im 40/92  soft-tissue]
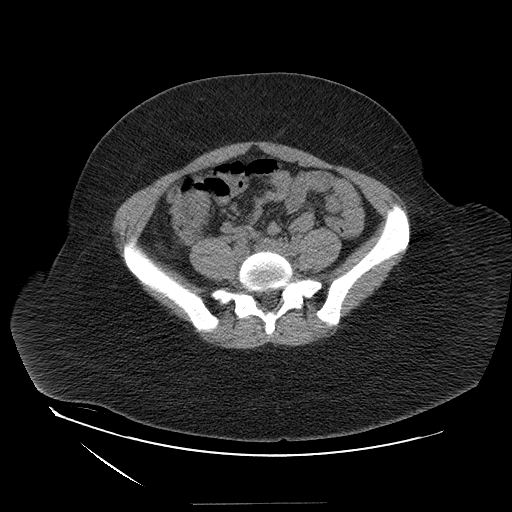
[im 52/92  soft-tissue]
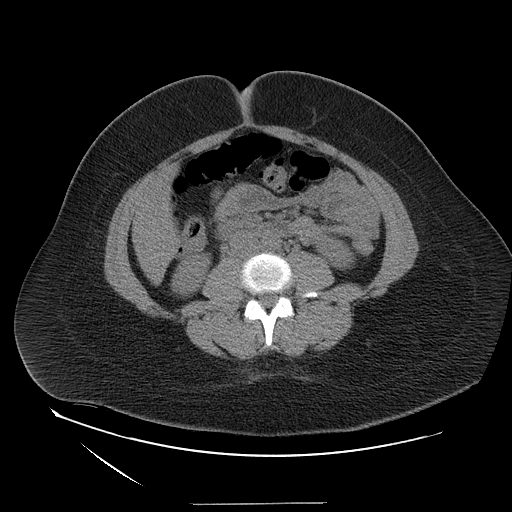
[im 60/92  soft-tissue]
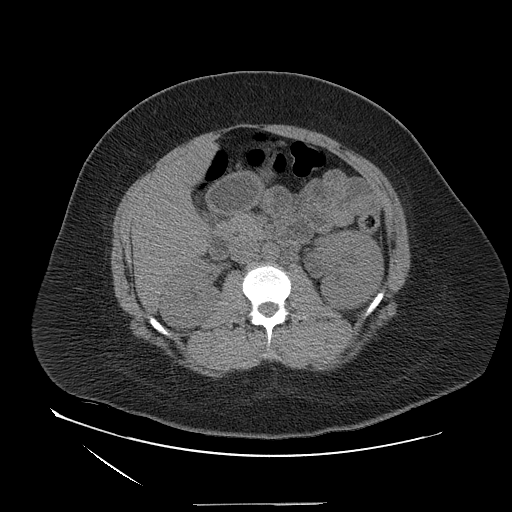
[im 68/92  soft-tissue]
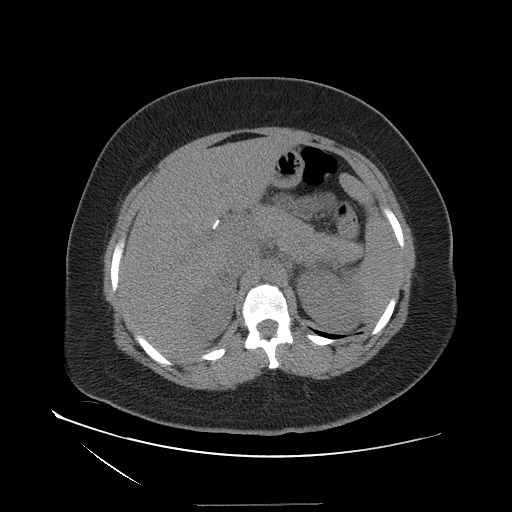
[im 76/92  soft-tissue]
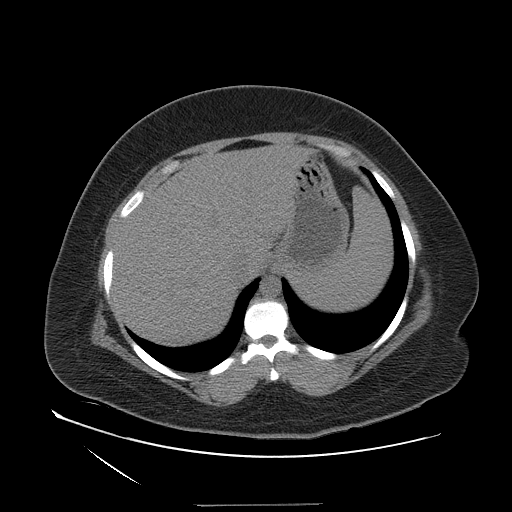
[im 76/92  lung]
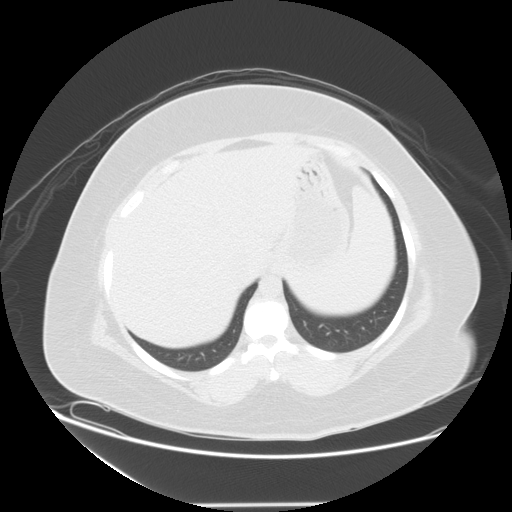
[im 76/92  bone]
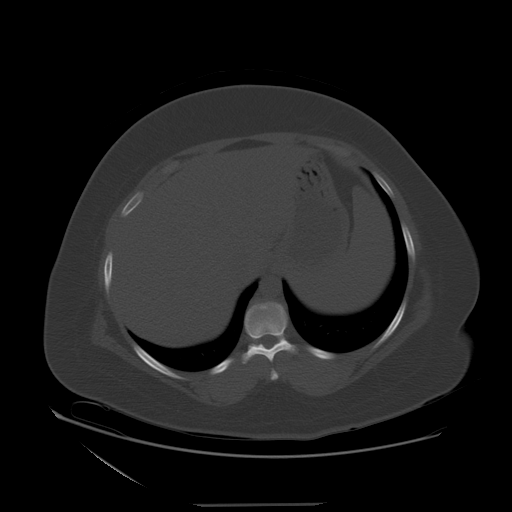
[im 80/92  lung]
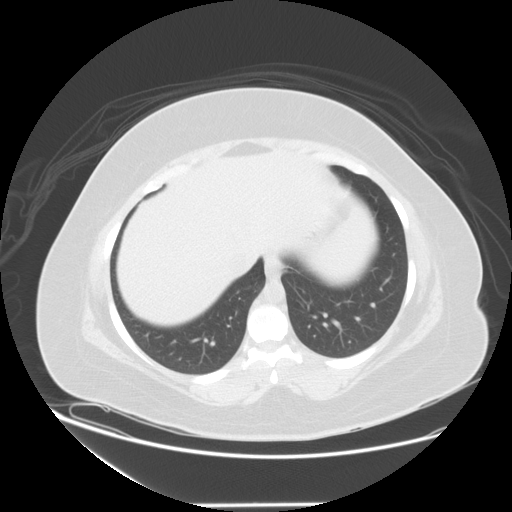
[im 84/92  soft-tissue]
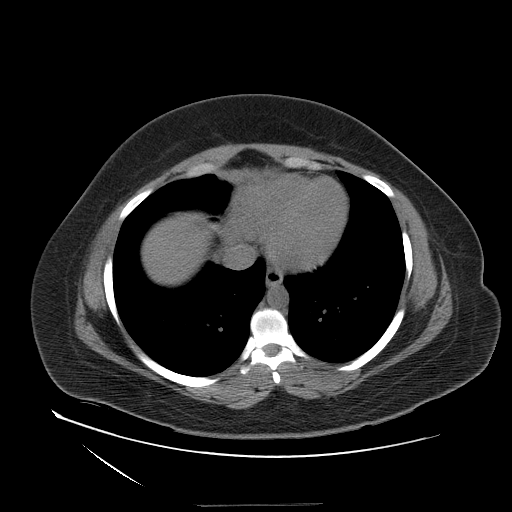
[im 84/92  lung]
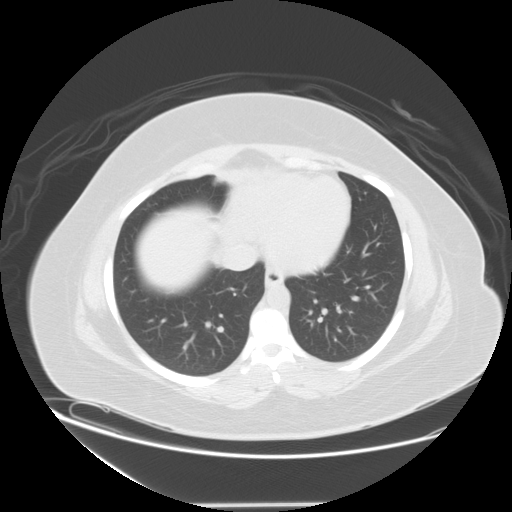
[im 88/92  lung]
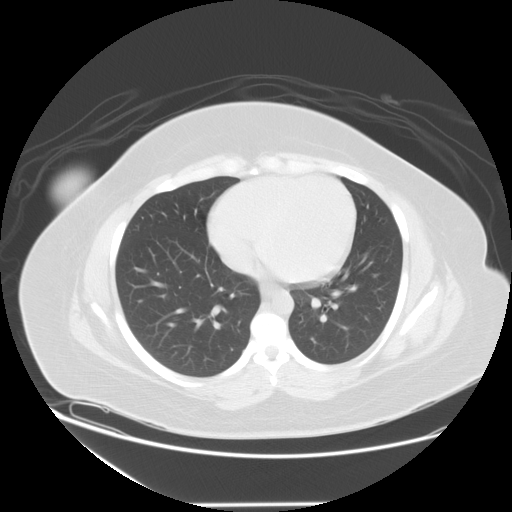

[Series 400: sag · sagittal · 0.92mm/px · 1 of 130 slices shown]
[im 44/130  soft-tissue]
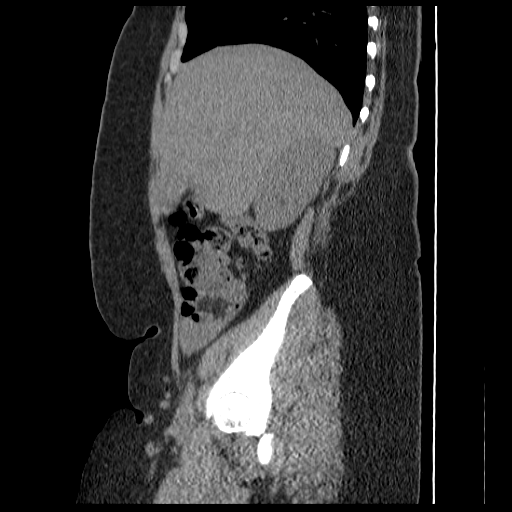

[Series 401: cor · coronal · 0.92mm/px · 3 of 121 slices shown]
[im 41/121  soft-tissue]
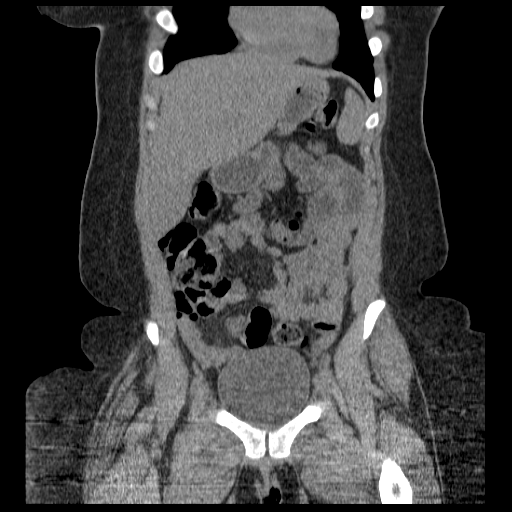
[im 54/121  soft-tissue]
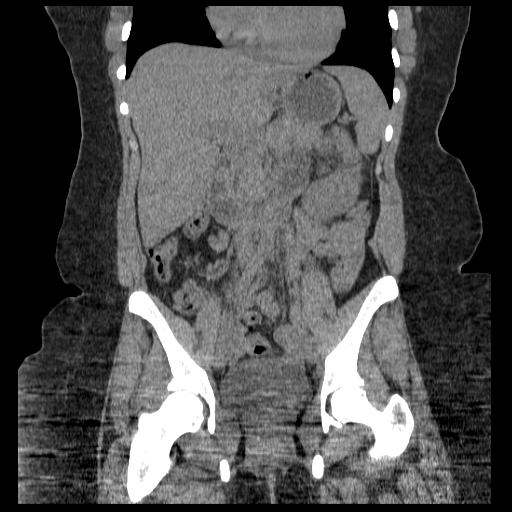
[im 67/121  soft-tissue]
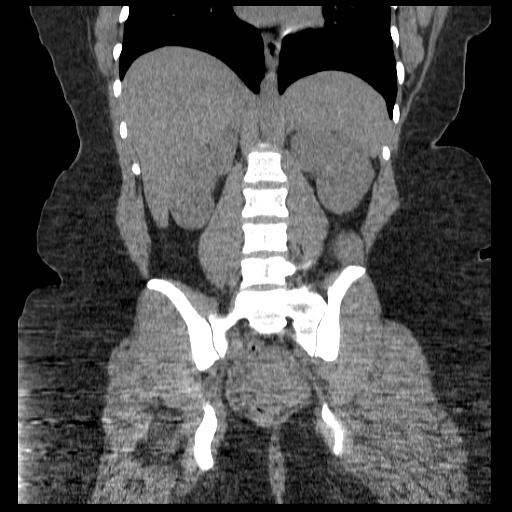

[16 of 46 positions shown; findings below may reference images not displayed]

FINDINGS: Negative for renal calculi or obstruction.  Bladder is
normal.

Lung bases are clear.  Prior cholecystectomy.  Liver spleen and
pancreas are normal.  No bowel obstruction.  No mass or adenopathy
or free fluid.
IMPRESSION: No renal calculi.  No acute abnormality.

## 2011-07-28 MED ORDER — HYDROMORPHONE HCL PF 1 MG/ML IJ SOLN
1.0000 mg | Freq: Once | INTRAMUSCULAR | Status: AC
  Administered 2011-07-28: 1 mg via INTRAVENOUS
  Filled 2011-07-28: qty 1

## 2011-07-28 MED ORDER — ONDANSETRON HCL 4 MG/2ML IJ SOLN
4.0000 mg | Freq: Once | INTRAMUSCULAR | Status: AC
  Administered 2011-07-28: 4 mg via INTRAVENOUS
  Filled 2011-07-28: qty 2

## 2011-07-28 MED ORDER — METRONIDAZOLE 500 MG PO TABS: 500.0000 mg | ORAL_TABLET | Freq: Two times a day (BID) | ORAL | Status: AC

## 2011-07-28 MED ORDER — CIPROFLOXACIN HCL 500 MG PO TABS: 500.0000 mg | ORAL_TABLET | Freq: Two times a day (BID) | ORAL | Status: AC

## 2011-07-28 MED ORDER — CEFTRIAXONE SODIUM 1 G IJ SOLR
1.0000 g | Freq: Once | INTRAMUSCULAR | Status: DC
  Filled 2011-07-28: qty 10

## 2011-07-28 MED ORDER — CEFTRIAXONE SODIUM 1 G IJ SOLR
1.0000 g | Freq: Once | INTRAMUSCULAR | Status: AC
  Administered 2011-07-28: 1 g via INTRAMUSCULAR
  Filled 2011-07-28: qty 10

## 2011-07-28 MED ORDER — HYDROMORPHONE HCL PF 1 MG/ML IJ SOLN
1.0000 mg | Freq: Once | INTRAMUSCULAR | Status: AC
  Administered 2011-07-28: 1 mg via INTRAMUSCULAR
  Filled 2011-07-28: qty 1

## 2011-07-28 MED ORDER — SODIUM CHLORIDE 0.9 % IV BOLUS (SEPSIS)
1000.0000 mL | Freq: Once | INTRAVENOUS | Status: AC
  Administered 2011-07-28: 1000 mL via INTRAVENOUS

## 2011-07-28 NOTE — ED Provider Notes (Signed)
History     CSN: [id number]  Arrival date & time 07/28/11  1305   None     Chief Complaint  Patient presents with  . Abdominal Pain  . Flank Pain    Patient is a 26 y.o. female presenting with abdominal pain. The history is provided by the patient.  Abdominal Pain The primary symptoms of the illness include abdominal pain (LLQ), dysuria, vaginal discharge and vaginal bleeding. The primary symptoms of the illness do not include fever, shortness of breath, vomiting or diarrhea. The current episode started yesterday. The onset of the illness was sudden. The problem has been gradually worsening.  The abdominal pain began yesterday. The pain came on gradually. The abdominal pain has been gradually worsening since its onset. The abdominal pain is located in the LLQ and suprapubic region. The abdominal pain does not radiate. The abdominal pain is relieved by nothing.  The dysuria is not associated with hematuria, frequency, urgency or dyspareunia.  The vaginal discharge is associated with dysuria. The vaginal discharge is not associated with dyspareunia.  Associated with: started menstrual period today. Pregnant now: Unknown. The patient has not had a change in bowel habit. Additional symptoms associated with the illness include back pain (LLQ and suprapubic). Symptoms associated with the illness do not include chills, constipation, urgency, hematuria or frequency.    Past Medical History  Diagnosis Date  . Cholecystitis 07/16/10    Past Surgical History  Procedure Date  . Cholecystectomy     History reviewed. No pertinent family history.  History  Substance Use Topics  . Smoking status: Current Some Day Smoker    Types: Cigars  . Smokeless tobacco: Never Used  . Alcohol Use: Yes    OB History    Grav Para Term Preterm Abortions TAB SAB Ect Mult Living                  Review of Systems  Constitutional: Negative for fever, chills, activity change and appetite change.  HENT:  Negative for neck pain and neck stiffness.   Respiratory: Negative for cough, chest tightness, shortness of breath and wheezing.   Cardiovascular: Negative for chest pain and palpitations.  Gastrointestinal: Positive for abdominal pain (LLQ). Negative for vomiting, diarrhea and constipation.  Genitourinary: Positive for dysuria, flank pain (left flank pain), vaginal bleeding and vaginal discharge. Negative for urgency, frequency, hematuria, decreased urine volume, difficulty urinating, menstrual problem, pelvic pain and dyspareunia.  Musculoskeletal: Positive for back pain (LLQ and suprapubic).  Skin: Negative for rash and wound.  Neurological: Negative for seizures, syncope, facial asymmetry and light-headedness.  Psychiatric/Behavioral: Negative for confusion and agitation.  All other systems reviewed and are negative.    Allergies  Butorphanol tartrate  Home Medications   Current Outpatient Rx  Name Route Sig Dispense Refill  . ACETAMINOPHEN 500 MG PO TABS Oral Take 1,000 mg by mouth every 6 (six) hours as needed. Stomach pains    . AMLODIPINE BESYLATE 10 MG PO TABS Oral Take 10 mg by mouth daily.    Marland Kitchen HYDROCHLOROTHIAZIDE 25 MG PO TABS Oral Take 25 mg by mouth daily.    Marland Kitchen NORGESTIMATE-ETH ESTRADIOL 0.25-35 MG-MCG PO TABS Oral Take 1 tablet by mouth daily.      BP 139/77  Pulse 88  Temp 98.4 F (36.9 C) (Oral)  Resp 18  SpO2 99%  Physical Exam  Nursing note and vitals reviewed. Constitutional: She appears well-developed and well-nourished.  HENT:  Head: Normocephalic and atraumatic.  Right Ear: External  ear normal.  Left Ear: External ear normal.  Nose: Nose normal.  Mouth/Throat: Oropharynx is clear and moist. No oropharyngeal exudate.  Eyes: Conjunctivae are normal. Pupils are equal, round, and reactive to light.  Neck: Normal range of motion. Neck supple.  Cardiovascular: Normal rate, regular rhythm, normal heart sounds and intact distal pulses.  Exam reveals no  gallop and no friction rub.   No murmur heard. Pulmonary/Chest: Effort normal and breath sounds normal. No respiratory distress. She has no wheezes. She has no rales. She exhibits no tenderness.  Abdominal: Soft. Bowel sounds are normal. She exhibits no distension and no mass. There is tenderness (LLQ). There is no rebound and no guarding.  Musculoskeletal: Normal range of motion. She exhibits no edema and no tenderness.  Neurological: She is alert.  Skin: Skin is warm and dry.  Psychiatric: She has a normal mood and affect. Her behavior is normal. Judgment and thought content normal.    ED Course  Procedures (including critical care time)  Labs Reviewed  URINALYSIS, ROUTINE W REFLEX MICROSCOPIC - Abnormal; Notable for the following:    APPearance CLOUDY (*)     Hgb urine dipstick LARGE (*)     Protein, ur 30 (*)     Nitrite POSITIVE (*)     Leukocytes, UA SMALL (*)     All other components within normal limits  COMPREHENSIVE METABOLIC PANEL - Abnormal; Notable for the following:    Potassium 3.4 (*)     All other components within normal limits  CBC - Abnormal; Notable for the following:    Hemoglobin 11.3 (*)     HCT 33.5 (*)     MCV 75.5 (*)     MCH 25.5 (*)     All other components within normal limits  WET PREP, GENITAL - Abnormal; Notable for the following:    Clue Cells Wet Prep HPF POC FEW (*)     WBC, Wet Prep HPF POC FEW (*)     All other components within normal limits  URINE MICROSCOPIC-ADD ON - Abnormal; Notable for the following:    Squamous Epithelial / LPF FEW (*)     Bacteria, UA FEW (*)     All other components within normal limits  DIFFERENTIAL  LIPASE, BLOOD  POCT PREGNANCY, URINE  GC/CHLAMYDIA PROBE AMP, GENITAL  URINE CULTURE   Ct Abdomen Pelvis Wo Contrast  07/28/2011  *RADIOLOGY REPORT*  Clinical Data: Flank pain.  UTI.  Left lower quadrant pain  CT ABDOMEN AND PELVIS WITHOUT CONTRAST  Technique:  Multidetector CT imaging of the abdomen and pelvis  was performed following the standard protocol without intravenous contrast.  Comparison: None.  Findings: Negative for renal calculi or obstruction.  Bladder is normal.  Lung bases are clear.  Prior cholecystectomy.  Liver spleen and pancreas are normal.  No bowel obstruction.  No mass or adenopathy or free fluid.  IMPRESSION: No renal calculi.  No acute abnormality.  Original Report Authenticated By: Camelia Phenes, M.D.     1. Urinary tract infection   2. Bacterial vaginosis       MDM  26 yo F presents with 1 day of intermittent left flank and LLQ abdominal pain. Pt states she started her period today; LMP 6 weeks ago. AFVSS, but very tearful and appears to be in pain on arrival. Pelvic reveals discharge but no cervical motion or adnexal tenderness; doubt PID. Urine hCG negative. U/A c/w UTI.   Because of flank pain with radiation to  groin in setting of UTI, Abd CT obtained and negative for evidence of ureterolithiasis. Pain resolved with symptomatic treatment in ED. Labs also c/w BV. Will treat BV with PO Flagyl and mild pyelonephritis with one dose of IM Rocephin and provide prescription for PO Cipro in this patient who is tolerating PO intake well. Patient given return precautions, including worsening of signs or symptoms. Patient instructed to follow-up with primary care physician.          Clemetine Marker, MD 07/28/11 2350

## 2011-07-28 NOTE — ED Notes (Signed)
Pt c/o left sided flank and abd pain starting yesterday; pt sts burning with urination and sts some cloudy urine; pt tearful

## 2011-07-28 NOTE — ED Notes (Signed)
MD at bedside. 

## 2011-07-28 NOTE — Discharge Instructions (Signed)
Bacterial Vaginosis Bacterial vaginosis (BV) is a vaginal infection where the normal balance of bacteria in the vagina is disrupted. The normal balance is then replaced by an overgrowth of certain bacteria. There are several different kinds of bacteria that can cause BV. BV is the most common vaginal infection in women of childbearing age. CAUSES   The cause of BV is not fully understood. BV develops when there is an increase or imbalance of harmful bacteria.   Some activities or behaviors can upset the normal balance of bacteria in the vagina and put women at increased risk including:   Having a new sex partner or multiple sex partners.   Douching.   Using an intrauterine device (IUD) for contraception.   It is not clear what role sexual activity plays in the development of BV. However, women that have never had sexual intercourse are rarely infected with BV.  Women do not get BV from toilet seats, bedding, swimming pools or from touching objects around them.  SYMPTOMS   Grey vaginal discharge.   A fish-like odor with discharge, especially after sexual intercourse.   Itching or burning of the vagina and vulva.   Burning or pain with urination.   Some women have no signs or symptoms at all.  DIAGNOSIS  Your caregiver must examine the vagina for signs of BV. Your caregiver will perform lab tests and look at the sample of vaginal fluid through a microscope. They will look for bacteria and abnormal cells (clue cells), a pH test higher than 4.5, and a positive amine test all associated with BV.  RISKS AND COMPLICATIONS   Pelvic inflammatory disease (PID).   Infections following gynecology surgery.   Developing HIV.   Developing herpes virus.  TREATMENT  Sometimes BV will clear up without treatment. However, all women with symptoms of BV should be treated to avoid complications, especially if gynecology surgery is planned. Female partners generally do not need to be treated. However,  BV may spread between female sex partners so treatment is helpful in preventing a recurrence of BV.   BV may be treated with antibiotics. The antibiotics come in either pill or vaginal cream forms. Either can be used with nonpregnant or pregnant women, but the recommended dosages differ. These antibiotics are not harmful to the baby.   BV can recur after treatment. If this happens, a second round of antibiotics will often be prescribed.   Treatment is important for pregnant women. If not treated, BV can cause a premature delivery, especially for a pregnant woman who had a premature birth in the past. All pregnant women who have symptoms of BV should be checked and treated.   For chronic reoccurrence of BV, treatment with a type of prescribed gel vaginally twice a week is helpful.  HOME CARE INSTRUCTIONS   Finish all medication as directed by your caregiver.   Do not have sex until treatment is completed.   Tell your sexual partner that you have a vaginal infection. They should see their caregiver and be treated if they have problems, such as a mild rash or itching.   Practice safe sex. Use condoms. Only have 1 sex partner.  PREVENTION  Basic prevention steps can help reduce the risk of upsetting the natural balance of bacteria in the vagina and developing BV:  Do not have sexual intercourse (be abstinent).   Do not douche.   Use all of the medicine prescribed for treatment of BV, even if the signs and symptoms go away.     Tell your sex partner if you have BV. That way, they can be treated, if needed, to prevent reoccurrence.  SEEK MEDICAL CARE IF:   Your symptoms are not improving after 3 days of treatment.   You have increased discharge, pain, or fever.  MAKE SURE YOU:   Understand these instructions.   Will watch your condition.   Will get help right away if you are not doing well or get worse.  FOR MORE INFORMATION  Division of STD Prevention (DSTDP), Centers for Disease  Control and Prevention: SolutionApps.[employer].za American Social Health Association (ASHA): www.ashastd.org  Document Released: 01/26/2005 Document Revised: 01/15/2011 Document Reviewed: 07/19/2008 [facility] Patient Information 2012 [place], [place].Urinary Tract Infection A urinary tract infection (UTI) is often caused by a germ (bacteria). A UTI is usually helped with medicine (antibiotics) that kills germs. Take all the medicine until it is gone. Do this even if you are feeling better. You are usually better in 7 to 10 days. HOME CARE   Drink enough water and fluids to keep your pee (urine) clear or pale yellow. Drink:   Cranberry juice.   Water.   Avoid:   Caffeine.   Tea.   Bubbly (carbonated) drinks.   Alcohol.   Only take medicine as told by your doctor.   To prevent further infections:   Pee often.   After pooping (bowel movement), women should wipe from front to back. Use each tissue only once.   Pee before and after having sex (intercourse).  Ask your doctor when your test results will be ready. Make sure you follow up and get your test results.  GET HELP RIGHT AWAY IF:   There is very bad back pain or lower belly (abdominal) pain.   You get the chills.   You have a fever.   Your baby is older than 3 months with a rectal temperature of 102 F (38.9 C) or higher.   Your baby is 68 months old or younger with a rectal temperature of 100.4 F (38 C) or higher.   You feel sick to your stomach (nauseous) or throw up (vomit).   There is continued burning with peeing.   Your problems are not better in 3 days. Return sooner if you are getting worse.  MAKE SURE YOU:   Understand these instructions.   Will watch your condition.   Will get help right away if you are not doing well or get worse.  Document Released: 07/15/2007 Document Revised: 01/15/2011 Document Reviewed: 07/15/2007 [facility] Patient Information 2012 [place], [place].Vaginitis Vaginitis in a soreness,  swelling and redness (inflammation) of the vagina and vulva. This is not a sexually transmitted infection.  CAUSES  Yeast vaginitis is caused by yeast (candida) that is normally found in your vagina. With a yeast infection, the candida has over grown in number to a point that upsets the chemical balance. SYMPTOMS   White thick vaginal discharge.   Swelling, itching, redness and irritation of the vagina and possibly the lips of the vagina (vulva).   Burning or painful urination.   Painful intercourse.  HOME CARE INSTRUCTIONS   Finish all medication as prescribed.   Do not have sex until treatment is completed or instructed by your healthcare giver.   Take warm sitz baths.   Do not douche.   Do not use tampons, especially scented ones.   Wear cotton underwear.   Avoid tight pants and panty hose.   Tell your sexual partner that you have a yeast infection. They should  go to their caregiver if they have symptoms such as mild rash or itching.   Your sexual partner should be treated if your infection is difficult to eliminate.   Practice safer sex. Use condoms.   Some vaginal medications cause latex condoms to fail. Ask your caregiver this.  SEEK MEDICAL CARE IF:   You develop a fever.   The infection is getting worse after 2 days of treatment.   The infection is not getting better after 3 days of treatment.   You develop blisters in or around your vagina.   You develop vaginal bleeding, and it is not your menstrual period.   You have pain when you urinate.   You develop intestinal problems.   You have pain with sexual intercourse.  Document Released: 03/05/2004 Document Revised: 01/15/2011 Document Reviewed: 10/11/2008 [facility] Patient Information 2012 [place], [place].

## 2011-07-28 NOTE — ED Provider Notes (Signed)
Complains of left flank pain radiating to suprapubic area onset yesterday not made better or worse by anything vomited slight amount of yellow material yesterday no nausea today no fever nothing makes symptoms better or worse. On exam appears uncomfortable tearful lungs clear auscultation abdomen obese minimally tender left lower quadrant, left flank tender.  Doug Sou, MD 07/29/11 [phone]

## 2011-07-28 NOTE — ED Notes (Signed)
Pt discharged in stable condition with significant other. Pt reports no reaction to Rocephin injection. Pt discharge instructions including importance of finishing all antibiotics discussed with pt. Pt verbalizes understanding of these instructions and denies any questions at this time.

## 2011-07-29 LAB — GC/CHLAMYDIA PROBE AMP, GENITAL: Chlamydia, DNA Probe: NEGATIVE

## 2011-07-29 NOTE — ED Provider Notes (Signed)
I have personally seen and examined the patient.  I have discussed the plan of care with the resident.  I have reviewed the documentation on PMH/FH/Soc. History.  I have reviewed the documentation of the resident and agree.  Doug Sou, MD 07/29/11 0157

## 2011-07-31 LAB — URINE CULTURE
Colony Count: >=100000
Culture  Setup Time: 201306190157

## 2011-08-01 NOTE — ED Notes (Signed)
+  Urine. Patient treated with Cipro. Sensitive to same. Per protocol MD. °

## 2011-09-13 ENCOUNTER — Encounter (HOSPITAL_COMMUNITY): Payer: Self-pay

## 2011-09-13 ENCOUNTER — Emergency Department (HOSPITAL_COMMUNITY)
Admission: EM | Admit: 2011-09-13 | Discharge: 2011-09-14 | Disposition: A | Discharge status: Home / Self Care | Payer: Medicaid Other | Attending: Emergency Medicine | Admitting: Emergency Medicine

## 2011-09-13 DIAGNOSIS — A499 Bacterial infection, unspecified: Secondary | ICD-10-CM | POA: Insufficient documentation

## 2011-09-13 DIAGNOSIS — I10 Essential (primary) hypertension: Secondary | ICD-10-CM | POA: Insufficient documentation

## 2011-09-13 DIAGNOSIS — Z9089 Acquired absence of other organs: Secondary | ICD-10-CM | POA: Insufficient documentation

## 2011-09-13 DIAGNOSIS — N760 Acute vaginitis: Secondary | ICD-10-CM | POA: Insufficient documentation

## 2011-09-13 DIAGNOSIS — K0889 Other specified disorders of teeth and supporting structures: Secondary | ICD-10-CM

## 2011-09-13 DIAGNOSIS — R109 Unspecified abdominal pain: Secondary | ICD-10-CM

## 2011-09-13 DIAGNOSIS — K089 Disorder of teeth and supporting structures, unspecified: Secondary | ICD-10-CM | POA: Insufficient documentation

## 2011-09-13 DIAGNOSIS — F17200 Nicotine dependence, unspecified, uncomplicated: Secondary | ICD-10-CM | POA: Insufficient documentation

## 2011-09-13 DIAGNOSIS — B9689 Other specified bacterial agents as the cause of diseases classified elsewhere: Secondary | ICD-10-CM | POA: Insufficient documentation

## 2011-09-13 HISTORY — DX: Essential (primary) hypertension: I10

## 2011-09-13 LAB — URINALYSIS, ROUTINE W REFLEX MICROSCOPIC
Bilirubin Urine: NEGATIVE
Glucose, UA: NEGATIVE mg/dL
Hgb urine dipstick: NEGATIVE
Ketones, ur: NEGATIVE mg/dL
Leukocytes, UA: NEGATIVE
Nitrite: NEGATIVE
Protein, ur: NEGATIVE mg/dL
Specific Gravity, Urine: 1.019 (ref 1.005–1.030)
Urobilinogen, UA: 0.2 mg/dL (ref 0.0–1.0)
pH: 5.5 (ref 5.0–8.0)

## 2011-09-13 LAB — POCT PREGNANCY, URINE: Preg Test, Ur: NEGATIVE

## 2011-09-13 NOTE — ED Notes (Signed)
Pt. Reports lower abdominal pain  X 3 days. Reports nausea and vomiting x 2 episodes. Reports diarrhea yesterday.

## 2011-09-14 ENCOUNTER — Emergency Department (HOSPITAL_COMMUNITY): Payer: Medicaid Other

## 2011-09-14 LAB — CBC WITH DIFFERENTIAL/PLATELET
Basophils Absolute: 0 10*3/uL (ref 0.0–0.1)
Basophils Relative: 0 % (ref 0–1)
Eosinophils Absolute: 0.2 10*3/uL (ref 0.0–0.7)
Eosinophils Relative: 3 % (ref 0–5)
HCT: 31.8 % — ABNORMAL LOW (ref 36.0–46.0)
Hemoglobin: 10.8 g/dL — ABNORMAL LOW (ref 12.0–15.0)
Lymphocytes Relative: 48 % — ABNORMAL HIGH (ref 12–46)
Lymphs Abs: 2.6 K/uL (ref 0.7–4.0)
MCH: 25.7 pg — ABNORMAL LOW (ref 26.0–34.0)
MCHC: 34 g/dL (ref 30.0–36.0)
MCV: 75.5 fL — ABNORMAL LOW (ref 78.0–100.0)
Monocytes Absolute: 0.4 10*3/uL (ref 0.1–1.0)
Monocytes Relative: 8 % (ref 3–12)
Neutro Abs: 2.3 10*3/uL (ref 1.7–7.7)
Neutrophils Relative %: 41 % — ABNORMAL LOW (ref 43–77)
Platelets: 240 K/uL (ref 150–400)
RBC: 4.21 MIL/uL (ref 3.87–5.11)
RDW: 14.4 % (ref 11.5–15.5)
WBC: 5.5 K/uL (ref 4.0–10.5)

## 2011-09-14 LAB — COMPREHENSIVE METABOLIC PANEL
AST: 12 U/L (ref 0–37)
Albumin: 3.4 g/dL — ABNORMAL LOW (ref 3.5–5.2)
Chloride: 103 mEq/L (ref 96–112)
Creatinine, Ser: 0.69 mg/dL (ref 0.50–1.10)
Total Bilirubin: 0.1 mg/dL — ABNORMAL LOW (ref 0.3–1.2)
Total Protein: 6.8 g/dL (ref 6.0–8.3)

## 2011-09-14 LAB — COMPREHENSIVE METABOLIC PANEL WITH GFR
ALT: 9 U/L (ref 0–35)
Alkaline Phosphatase: 66 U/L (ref 39–117)
BUN: 13 mg/dL (ref 6–23)
CO2: 28 meq/L (ref 19–32)
Calcium: 9.4 mg/dL (ref 8.4–10.5)
GFR calc Af Amer: >90 mL/min (ref >90)
GFR calc non Af Amer: >90 mL/min (ref >90)
Glucose, Bld: 99 mg/dL (ref 70–99)
Potassium: 4.1 meq/L (ref 3.5–5.1)
Sodium: 138 meq/L (ref 135–145)

## 2011-09-14 LAB — WET PREP, GENITAL
Trich, Wet Prep: NONE SEEN
Yeast Wet Prep HPF POC: NONE SEEN

## 2011-09-14 IMAGING — US US PELVIS COMPLETE
1 series · 13 of 25 positions shown · non-contrast
Comparison: CT abdomen and pelvis [DATE]

CLINICAL DATA: Pelvic pain.

TRANSABDOMINAL AND TRANSVAGINAL ULTRASOUND OF PELVIS
DOPPLER ULTRASOUND OF OVARIES
TECHNIQUE: Both transabdominal and transvaginal ultrasound
examinations of the pelvis were performed. Transabdominal technique
was performed for global imaging of the pelvis including uterus,
ovaries, adnexal regions, and pelvic cul-de-sac.
It was necessary to proceed with endovaginal exam following the
transabdominal exam to visualize the endometrium and ovaries..
Color and duplex Doppler ultrasound was utilized to evaluate blood
flow to the ovaries.

[Series 1: us pelvis complete · 0.27mm/px · 13 of 58 slices shown]
[im 1/58]
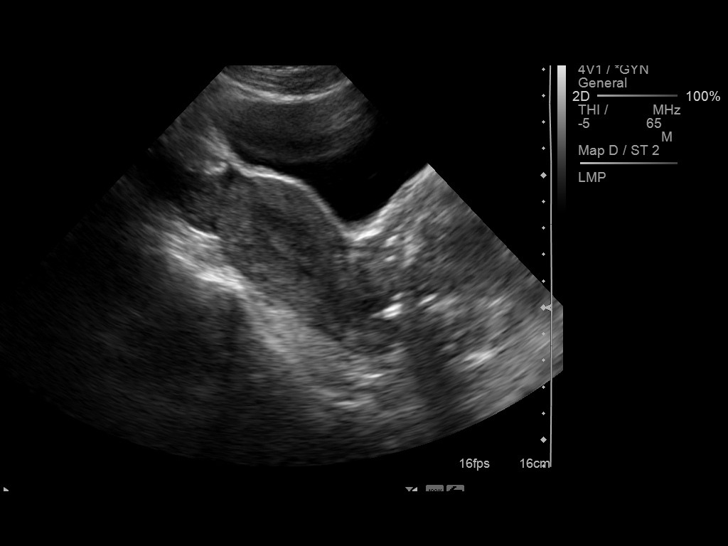
[im 5/58]
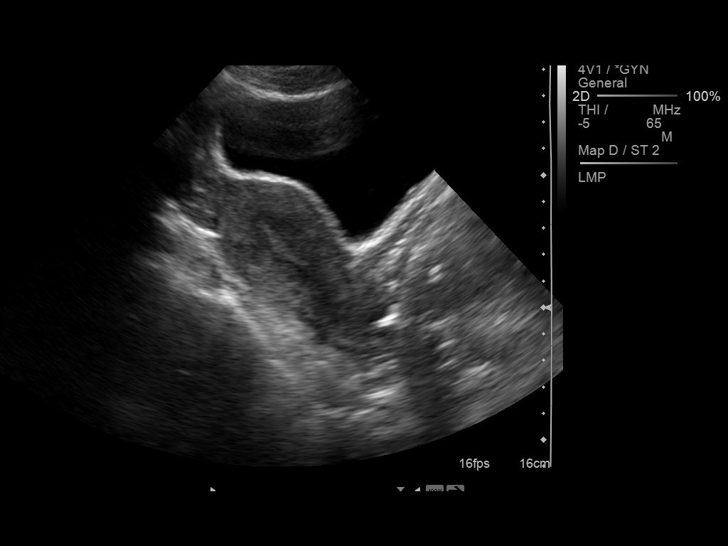
[im 10/58]
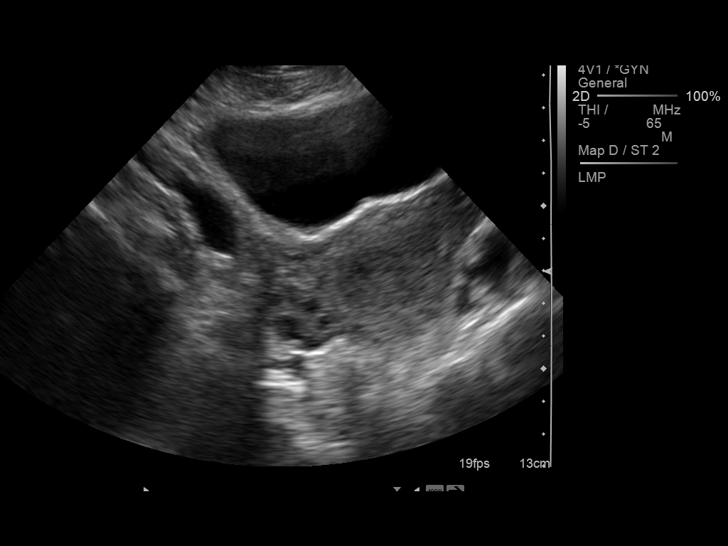
[im 15/58]
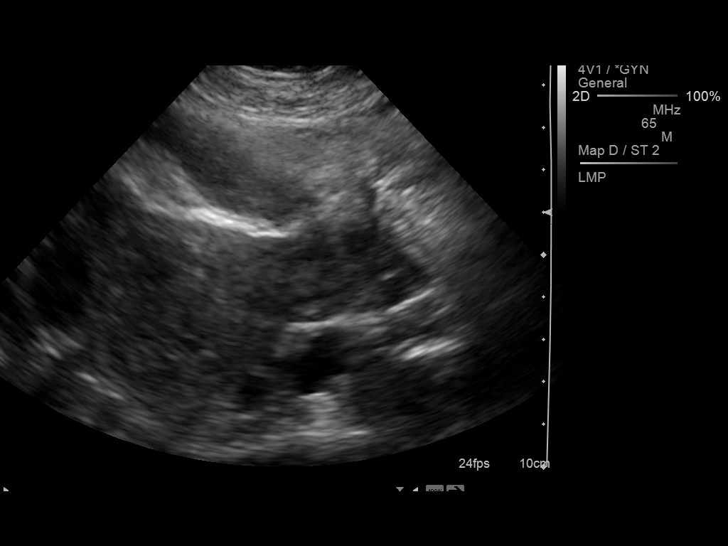
[im 20/58]
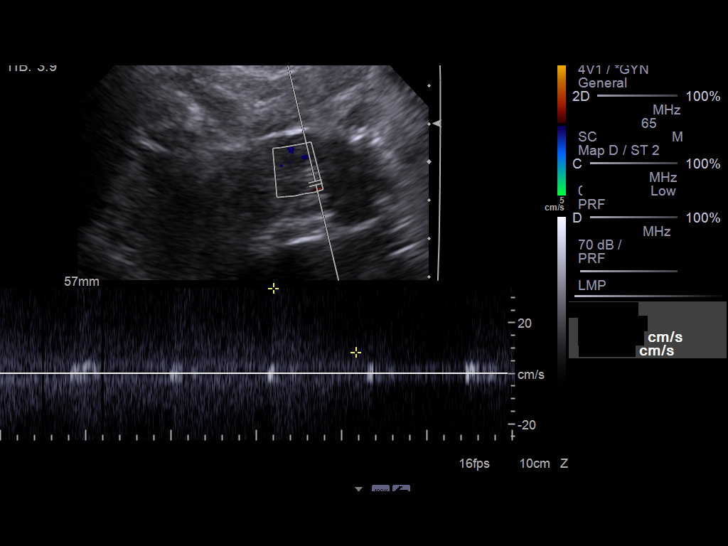
[im 24/58]
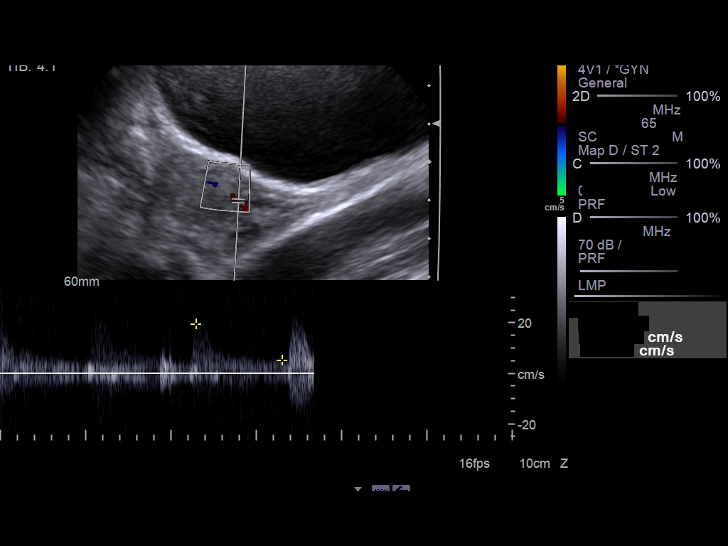
[im 29/58]
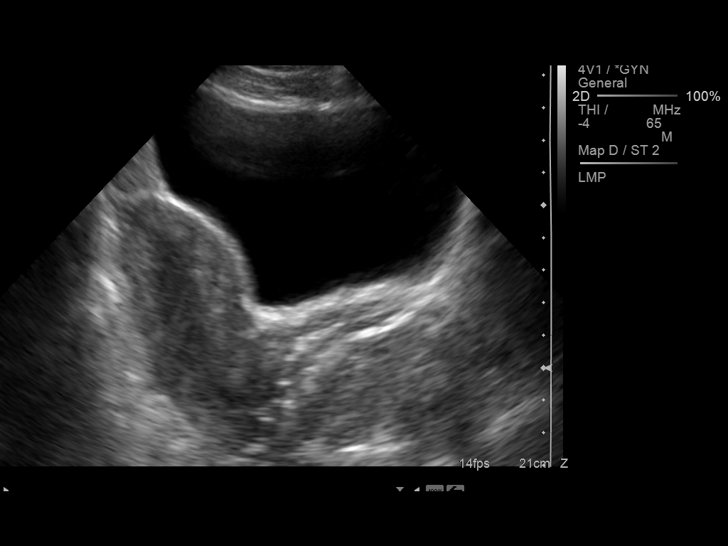
[im 34/58]
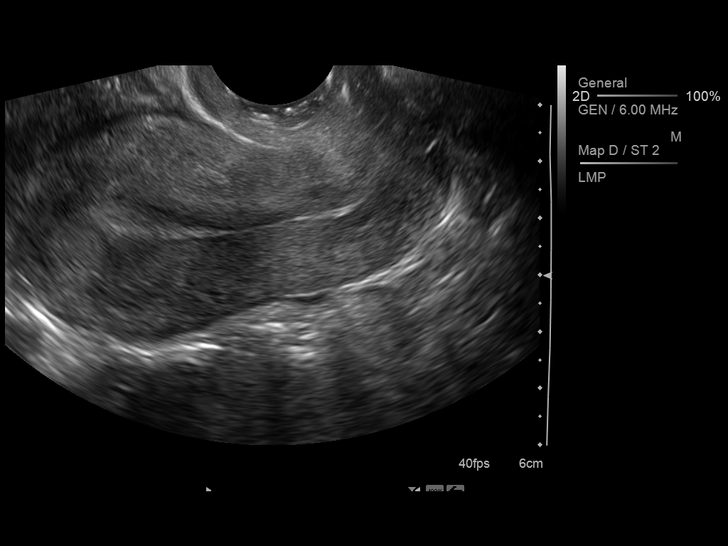
[im 39/58]
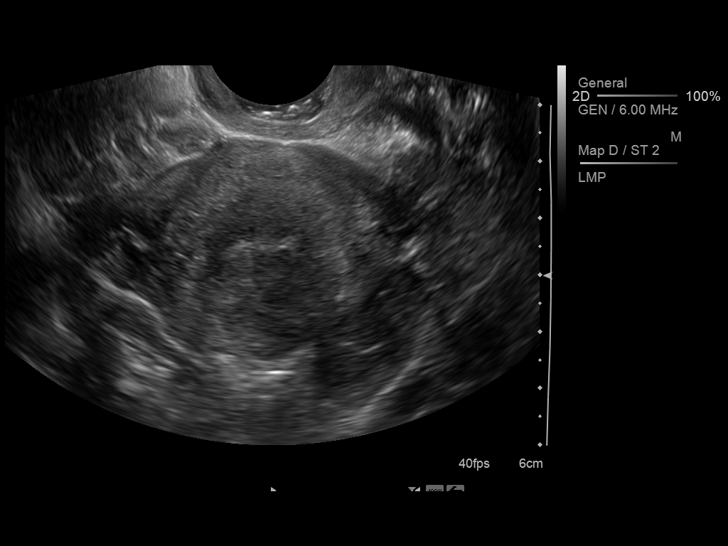
[im 43/58]
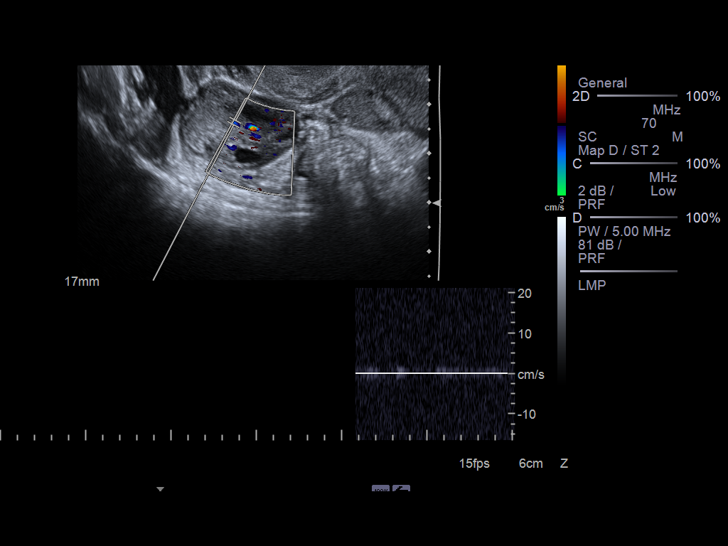
[im 48/58]
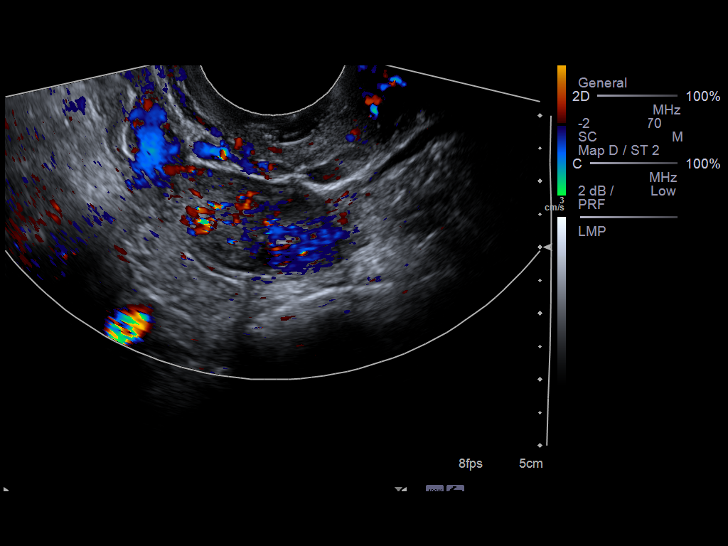
[im 53/58]
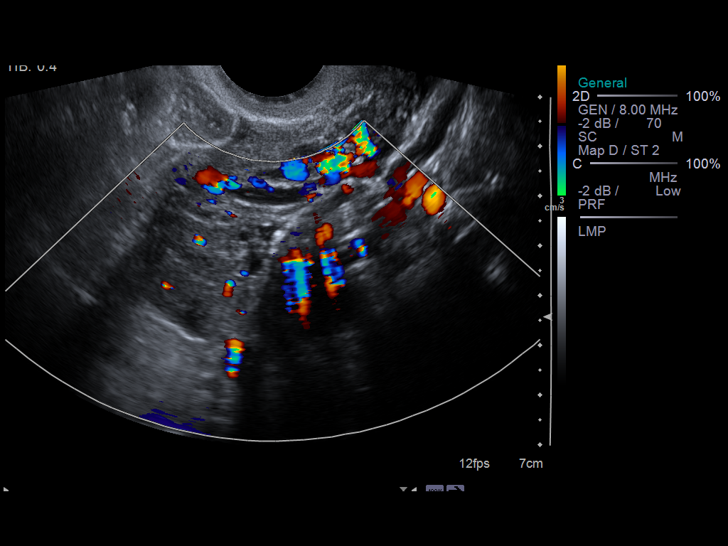
[im 58/58]
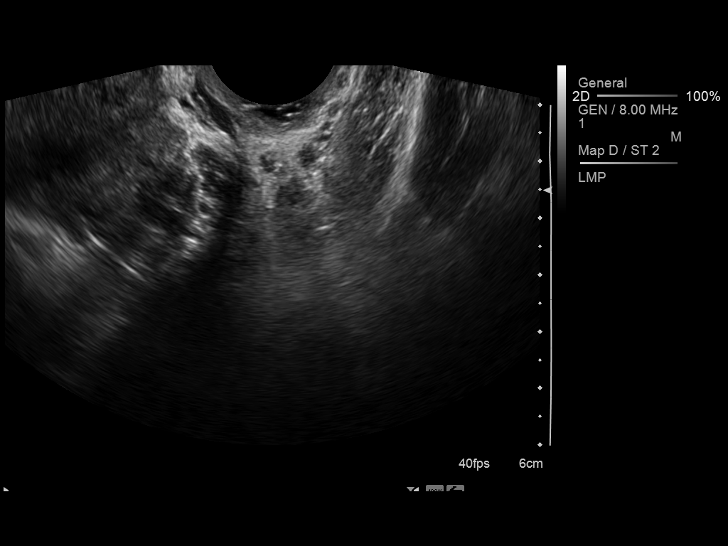

[13 of 25 positions shown; findings below may reference images not displayed]

FINDINGS: Uterus:  The uterus is anteverted and measures 8.1 x 4 x 5 cm.
Normal homogeneous parenchymal echotexture without focal mass
lesions.

Endometrium:  Normal endometrial stripe thickness measured at about
7 mm.  No endometrial fluid collections.

Right ovary: Right ovary measures 2.9 x 1.5 x 2.2 cm.  Normal
follicular changes.  Normal flow on color flow Doppler imaging.

Left ovary:   Left ovary measures 3.5 x 2.1 x 2.8 cm.  Normal
follicular changes.  Normal flow on color flow Doppler imaging.

Pulsed Doppler evaluation demonstrates normal arterial and venous
flow velocity waveform patterns in both ovaries.

No free pelvic fluid collections.
IMPRESSION: Normal exam.  No evidence of pelvic mass or other significant
abnormality.

No sonographic evidence for ovarian torsion.

## 2011-09-14 MED ORDER — METRONIDAZOLE 500 MG PO TABS: 500.0000 mg | ORAL_TABLET | Freq: Two times a day (BID) | ORAL | Status: AC

## 2011-09-14 MED ORDER — SODIUM CHLORIDE 0.9 % IV BOLUS (SEPSIS)
1000.0000 mL | Freq: Once | INTRAVENOUS | Status: AC
  Administered 2011-09-14: 1000 mL via INTRAVENOUS

## 2011-09-14 MED ORDER — ONDANSETRON HCL 4 MG/2ML IJ SOLN
4.0000 mg | Freq: Once | INTRAMUSCULAR | Status: AC
  Administered 2011-09-14: 4 mg via INTRAVENOUS
  Filled 2011-09-14: qty 2

## 2011-09-14 MED ORDER — HYDROCODONE-ACETAMINOPHEN 5-325 MG PO TABS: 1.0000 | ORAL_TABLET | ORAL | Status: AC | PRN

## 2011-09-14 MED ORDER — ONDANSETRON HCL 4 MG/2ML IJ SOLN
4.0000 mg | Freq: Once | INTRAMUSCULAR | Status: DC
  Filled 2011-09-14: qty 2

## 2011-09-14 MED ORDER — PENICILLIN V POTASSIUM 500 MG PO TABS: 500.0000 mg | ORAL_TABLET | Freq: Three times a day (TID) | ORAL | Status: AC

## 2011-09-14 MED ORDER — MORPHINE SULFATE 4 MG/ML IJ SOLN
4.0000 mg | Freq: Once | INTRAMUSCULAR | Status: AC
  Administered 2011-09-14: 4 mg via INTRAVENOUS
  Filled 2011-09-14: qty 1

## 2011-09-14 MED ORDER — HYDROCODONE-ACETAMINOPHEN 5-325 MG PO TABS
2.0000 | ORAL_TABLET | Freq: Once | ORAL | Status: AC
  Administered 2011-09-14: 2 via ORAL
  Filled 2011-09-14: qty 2

## 2011-09-14 NOTE — ED Notes (Signed)
Pt returns from ultrasound

## 2011-09-14 NOTE — ED Provider Notes (Signed)
History     CSN: [id number]  Arrival date & time 09/13/11  2239   First MD Initiated Contact with Patient 09/14/11 0001      Chief Complaint  Patient presents with  . Abdominal Pain  . Dental Pain   HPI  History provided by the patient. Patient is a 26 year old female with history of hypertension and cholecystectomy presents with complaints of diffuse lower abdominal pain for the past 3 days. Pain has been waxing and waning and persistent. Pain was associated with episodes of nausea and vomiting today. Patient also reports one episode of loose soft stool last night. She denies any watery diarrhea. She denies any blood or mucus in stool. She denies any dysuria, hematuria or flank pain. She does report slight urinary frequency. Patient also noticed slight vaginal discharge. She denies any vaginal bleeding. Last menstrual cycle was 3 weeks ago without significant change. Patient denies any fever, chills or sweats. Patient has been using Tylenol without significant relief. She is also use heat over the abdomen with slight improvement. She denies any other aggravating or alleviating factors. Patient also complains of left upper canine tooth pain. Patient reports having part of the tooth broken off. Pain is worse with pressure. Denies any swelling of gums or face.     Past Medical History  Diagnosis Date  . Cholecystitis 07/16/10  . Hypertension     Past Surgical History  Procedure Date  . Cholecystectomy     History reviewed. No pertinent family history.  History  Substance Use Topics  . Smoking status: Current Some Day Smoker    Types: Cigars  . Smokeless tobacco: Never Used  . Alcohol Use: No    OB History    Grav Para Term Preterm Abortions TAB SAB Ect Mult Living                  Review of Systems  Constitutional: Negative for fever, chills and appetite change.  Respiratory: Negative for cough.   Cardiovascular: Negative for chest pain.  Gastrointestinal: Positive for  vomiting, abdominal pain and diarrhea. Negative for constipation.  Genitourinary: Positive for frequency and vaginal discharge. Negative for dysuria, hematuria, flank pain and vaginal bleeding.  Musculoskeletal: Negative for back pain.    Allergies  Stadol  Home Medications   Current Outpatient Rx  Name Route Sig Dispense Refill  . ACETAMINOPHEN 500 MG PO TABS Oral Take 1,000 mg by mouth every 6 (six) hours as needed. Stomach pains    . AMLODIPINE BESYLATE 10 MG PO TABS Oral Take 10 mg by mouth daily.    Marland Kitchen HYDROCHLOROTHIAZIDE 25 MG PO TABS Oral Take 25 mg by mouth daily.      BP 122/85  Pulse 84  Temp 98.6 F (37 C) (Oral)  Resp 16  SpO2 100%  LMP 08/27/2011  Physical Exam  Nursing note and vitals reviewed. Constitutional: She is oriented to person, place, and time. She appears well-developed and well-nourished. No distress.  HENT:  Head: Normocephalic.  Cardiovascular: Normal rate and regular rhythm.   Pulmonary/Chest: Effort normal and breath sounds normal.  Abdominal: Soft. There is tenderness in the right lower quadrant, suprapubic area and left lower quadrant. There is no rebound, no guarding, no CVA tenderness, no tenderness at McBurney's point and negative Murphy's sign.       Obese  Genitourinary:       Chaperone was present. There is thick white discharge. There is tenderness of the right adnexa.  Neurological: She is alert and  oriented to person, place, and time.  Skin: Skin is warm and dry.  Psychiatric: She has a normal mood and affect. Her behavior is normal.    ED Course  Procedures  Results for orders placed during the hospital encounter of 09/13/11  CBC WITH DIFFERENTIAL      Component Value Range   WBC 5.5  4.0 - 10.5 K/uL   RBC 4.21  3.87 - 5.11 MIL/uL   Hemoglobin 10.8 (*) 12.0 - 15.0 g/dL   HCT 16.1 (*) 09.6 - 04.5 %   MCV 75.5 (*) 78.0 - 100.0 fL   MCH 25.7 (*) 26.0 - 34.0 pg   MCHC 34.0  30.0 - 36.0 g/dL   RDW 40.9  81.1 - 91.4 %    Platelets 240  150 - 400 K/uL   Neutrophils Relative 41 (*) 43 - 77 %   Neutro Abs 2.3  1.7 - 7.7 K/uL   Lymphocytes Relative 48 (*) 12 - 46 %   Lymphs Abs 2.6  0.7 - 4.0 K/uL   Monocytes Relative 8  3 - 12 %   Monocytes Absolute 0.4  0.1 - 1.0 K/uL   Eosinophils Relative 3  0 - 5 %   Eosinophils Absolute 0.2  0.0 - 0.7 K/uL   Basophils Relative 0  0 - 1 %   Basophils Absolute 0.0  0.0 - 0.1 K/uL  COMPREHENSIVE METABOLIC PANEL      Component Value Range   Sodium 138  135 - 145 mEq/L   Potassium 4.1  3.5 - 5.1 mEq/L   Chloride 103  96 - 112 mEq/L   CO2 28  19 - 32 mEq/L   Glucose, Bld 99  70 - 99 mg/dL   BUN 13  6 - 23 mg/dL   Creatinine, Ser 7.82  0.50 - 1.10 mg/dL   Calcium 9.4  8.4 - 95.6 mg/dL   Total Protein 6.8  6.0 - 8.3 g/dL   Albumin 3.4 (*) 3.5 - 5.2 g/dL   AST 12  0 - 37 U/L   ALT 9  0 - 35 U/L   Alkaline Phosphatase 66  39 - 117 U/L   Total Bilirubin 0.1 (*) 0.3 - 1.2 mg/dL   GFR calc non Af Amer >90  >90 mL/min   GFR calc Af Amer >90  >90 mL/min  URINALYSIS, ROUTINE W REFLEX MICROSCOPIC      Component Value Range   Color, Urine YELLOW  YELLOW   APPearance CLEAR  CLEAR   Specific Gravity, Urine 1.019  1.005 - 1.030   pH 5.5  5.0 - 8.0   Glucose, UA NEGATIVE  NEGATIVE mg/dL   Hgb urine dipstick NEGATIVE  NEGATIVE   Bilirubin Urine NEGATIVE  NEGATIVE   Ketones, ur NEGATIVE  NEGATIVE mg/dL   Protein, ur NEGATIVE  NEGATIVE mg/dL   Urobilinogen, UA 0.2  0.0 - 1.0 mg/dL   Nitrite NEGATIVE  NEGATIVE   Leukocytes, UA NEGATIVE  NEGATIVE  POCT PREGNANCY, URINE      Component Value Range   Preg Test, Ur NEGATIVE  NEGATIVE  WET PREP, GENITAL      Component Value Range   Yeast Wet Prep HPF POC NONE SEEN  NONE SEEN   Trich, Wet Prep NONE SEEN  NONE SEEN   Clue Cells Wet Prep HPF POC MODERATE (*) NONE SEEN   WBC, Wet Prep HPF POC FEW (*) NONE SEEN   US Transvaginal Non-ob  09/14/2011  *RADIOLOGY REPORT*  Clinical Data:  Pelvic pain.  TRANSABDOMINAL AND TRANSVAGINAL  ULTRASOUND OF PELVIS DOPPLER ULTRASOUND OF OVARIES  Technique:  Both transabdominal and transvaginal ultrasound examinations of the pelvis were performed. Transabdominal technique was performed for global imaging of the pelvis including uterus, ovaries, adnexal regions, and pelvic cul-de-sac.  It was necessary to proceed with endovaginal exam following the transabdominal exam to visualize the endometrium and ovaries.  Color and duplex Doppler ultrasound was utilized to evaluate blood flow to the ovaries.  Comparison:  CT abdomen and pelvis 07/28/2011  Findings:  Uterus:  The uterus is anteverted and measures 8.1 x 4 x 5 cm. Normal homogeneous parenchymal echotexture without focal mass lesions.  Endometrium:  Normal endometrial stripe thickness measured at about 7 mm.  No endometrial fluid collections.  Right ovary: Right ovary measures 2.9 x 1.5 x 2.2 cm.  Normal follicular changes.  Normal flow on color flow Doppler imaging.  Left ovary:   Left ovary measures 3.5 x 2.1 x 2.8 cm.  Normal follicular changes.  Normal flow on color flow Doppler imaging.  Pulsed Doppler evaluation demonstrates normal arterial and venous flow velocity waveform patterns in both ovaries.  No free pelvic fluid collections.  IMPRESSION: Normal exam.  No evidence of pelvic mass or other significant abnormality.  No sonographic evidence for ovarian torsion.  Original Report Authenticated By: Marlon Pel, M.D.   US Pelvis Complete  09/14/2011  *RADIOLOGY REPORT*  Clinical Data:  Pelvic pain.  TRANSABDOMINAL AND TRANSVAGINAL ULTRASOUND OF PELVIS DOPPLER ULTRASOUND OF OVARIES  Technique:  Both transabdominal and transvaginal ultrasound examinations of the pelvis were performed. Transabdominal technique was performed for global imaging of the pelvis including uterus, ovaries, adnexal regions, and pelvic cul-de-sac.  It was necessary to proceed with endovaginal exam following the transabdominal exam to visualize the endometrium and  ovaries.  Color and duplex Doppler ultrasound was utilized to evaluate blood flow to the ovaries.  Comparison:  CT abdomen and pelvis 07/28/2011  Findings:  Uterus:  The uterus is anteverted and measures 8.1 x 4 x 5 cm. Normal homogeneous parenchymal echotexture without focal mass lesions.  Endometrium:  Normal endometrial stripe thickness measured at about 7 mm.  No endometrial fluid collections.  Right ovary: Right ovary measures 2.9 x 1.5 x 2.2 cm.  Normal follicular changes.  Normal flow on color flow Doppler imaging.  Left ovary:   Left ovary measures 3.5 x 2.1 x 2.8 cm.  Normal follicular changes.  Normal flow on color flow Doppler imaging.  Pulsed Doppler evaluation demonstrates normal arterial and venous flow velocity waveform patterns in both ovaries.  No free pelvic fluid collections.  IMPRESSION: Normal exam.  No evidence of pelvic mass or other significant abnormality.  No sonographic evidence for ovarian torsion.  Original Report Authenticated By: Marlon Pel, M.D.   [place] Art/ven Flow Abd Pelv Doppler  09/14/2011  *RADIOLOGY REPORT*  Clinical Data:  Pelvic pain.  TRANSABDOMINAL AND TRANSVAGINAL ULTRASOUND OF PELVIS DOPPLER ULTRASOUND OF OVARIES  Technique:  Both transabdominal and transvaginal ultrasound examinations of the pelvis were performed. Transabdominal technique was performed for global imaging of the pelvis including uterus, ovaries, adnexal regions, and pelvic cul-de-sac.  It was necessary to proceed with endovaginal exam following the transabdominal exam to visualize the endometrium and ovaries.  Color and duplex Doppler ultrasound was utilized to evaluate blood flow to the ovaries.  Comparison:  CT abdomen and pelvis 07/28/2011  Findings:  Uterus:  The uterus is anteverted and measures 8.1 x 4 x 5 cm. Normal  homogeneous parenchymal echotexture without focal mass lesions.  Endometrium:  Normal endometrial stripe thickness measured at about 7 mm.  No endometrial fluid collections.   Right ovary: Right ovary measures 2.9 x 1.5 x 2.2 cm.  Normal follicular changes.  Normal flow on color flow Doppler imaging.  Left ovary:   Left ovary measures 3.5 x 2.1 x 2.8 cm.  Normal follicular changes.  Normal flow on color flow Doppler imaging.  Pulsed Doppler evaluation demonstrates normal arterial and venous flow velocity waveform patterns in both ovaries.  No free pelvic fluid collections.  IMPRESSION: Normal exam.  No evidence of pelvic mass or other significant abnormality.  No sonographic evidence for ovarian torsion.  Original Report Authenticated By: Marlon Pel, M.D.           1. Pain, dental   2. Abdominal pain   3. Bacterial vaginosis       MDM  12:05 AM patient seen and evaluated. Patient appears uncomfortable.  Labs unremarkable. UA without signs of UTI. Will perform pelvic exam.  Patient with tenderness on pelvic exam. Will obtain ultrasound to rule out any concerning pathology.   Ultrasound are unremarkable. Patient having some improvement of abdominal pains. Patient continues to complain of dental pain. Will provide dental referral.      Angus Seller, [place] 09/14/11 [phone]

## 2011-09-14 NOTE — ED Notes (Signed)
Pt given discharge and follow up instructions after speaking with provider. Denies further needs at this time. Ambulates to lobby in NAD  

## 2011-09-14 NOTE — ED Notes (Signed)
Pt c/o LLQ pain x 3 days. Reports nausea and vomiting as well. States 3 episodes in the last 24 hours. Also reports urinary frequency and c/o left sided dental pain for same amt of time

## 2011-09-14 NOTE — ED Provider Notes (Signed)
Medical screening examination/treatment/procedure(s) were performed by non-physician practitioner and as supervising physician I was immediately available for consultation/collaboration.   Gavin Pound. Oletta Lamas, MD 09/14/11 0981

## 2011-09-15 LAB — GC/CHLAMYDIA PROBE AMP, GENITAL
Chlamydia, DNA Probe: NEGATIVE
GC Probe Amp, Genital: POSITIVE — AB

## 2011-09-16 NOTE — ED Notes (Signed)
+  Gonorrhea. Chart sent to EDP office for review. 

## 2011-09-20 NOTE — ED Notes (Signed)
Chart back from MD office.  Chart reviewed by Magnus Sinning PA "Cefixime 400 mg, one tablet po x 1 dose" 8/11 ID verified x 2.  Pt informed of dx, need for adll tx, notify partner(s) for testing and tx and abstain from sex 2 wks from tx.  Rx call to [facility] [phone]

## 2011-09-30 ENCOUNTER — Encounter (HOSPITAL_COMMUNITY): Payer: Self-pay | Admitting: *Deleted

## 2011-09-30 ENCOUNTER — Inpatient Hospital Stay (HOSPITAL_COMMUNITY)
Admission: AD | Admit: 2011-09-30 | Discharge: 2011-09-30 | Disposition: A | Discharge status: Home / Self Care | Payer: Medicaid Other | Source: Ambulatory Visit | Attending: Obstetrics & Gynecology | Admitting: Obstetrics & Gynecology

## 2011-09-30 DIAGNOSIS — N730 Acute parametritis and pelvic cellulitis: Secondary | ICD-10-CM | POA: Insufficient documentation

## 2011-09-30 DIAGNOSIS — R109 Unspecified abdominal pain: Secondary | ICD-10-CM | POA: Insufficient documentation

## 2011-09-30 DIAGNOSIS — N949 Unspecified condition associated with female genital organs and menstrual cycle: Secondary | ICD-10-CM | POA: Insufficient documentation

## 2011-09-30 LAB — CBC
Hemoglobin: 10.8 g/dL — ABNORMAL LOW (ref 12.0–15.0)
MCH: 24.9 pg — ABNORMAL LOW (ref 26.0–34.0)
MCHC: 33.1 g/dL (ref 30.0–36.0)
Platelets: 226 10*3/uL (ref 150–400)
RDW: 14.1 % (ref 11.5–15.5)

## 2011-09-30 LAB — URINALYSIS, ROUTINE W REFLEX MICROSCOPIC
Ketones, ur: NEGATIVE mg/dL
Leukocytes, UA: NEGATIVE
Nitrite: NEGATIVE
Protein, ur: NEGATIVE mg/dL
Urobilinogen, UA: 0.2 mg/dL (ref 0.0–1.0)

## 2011-09-30 LAB — WET PREP, GENITAL: Yeast Wet Prep HPF POC: NONE SEEN

## 2011-09-30 MED ORDER — FENTANYL CITRATE 0.05 MG/ML IJ SOLN
100.0000 ug | Freq: Once | INTRAMUSCULAR | Status: AC
  Administered 2011-09-30: 100 ug via INTRAMUSCULAR
  Filled 2011-09-30: qty 2

## 2011-09-30 MED ORDER — HYDROCODONE-ACETAMINOPHEN 5-500 MG PO TABS: 1.0000 | ORAL_TABLET | Freq: Four times a day (QID) | ORAL | Status: AC | PRN

## 2011-09-30 MED ORDER — DOXYCYCLINE HYCLATE 50 MG PO CAPS: 50.0000 mg | ORAL_CAPSULE | Freq: Two times a day (BID) | ORAL | Status: AC

## 2011-09-30 MED ORDER — METRONIDAZOLE 500 MG PO TABS: 500.0000 mg | ORAL_TABLET | Freq: Two times a day (BID) | ORAL | Status: AC

## 2011-09-30 MED ORDER — DICLOFENAC SODIUM 75 MG PO TBEC: 75.0000 mg | DELAYED_RELEASE_TABLET | Freq: Two times a day (BID) | ORAL | Status: DC

## 2011-09-30 MED ORDER — CEFTRIAXONE SODIUM 250 MG IJ SOLR
250.0000 mg | Freq: Once | INTRAMUSCULAR | Status: AC
  Administered 2011-09-30: 250 mg via INTRAMUSCULAR
  Filled 2011-09-30: qty 250

## 2011-09-30 NOTE — MAU Provider Note (Signed)
Chief Complaint:  Abdominal Pain and Vaginal Discharge    First Provider Initiated Contact with Patient 09/30/11 2152      Catherine Simmons is  26 y.o. G2P2.  Patient's last menstrual period was 08/27/2011.Marland Kitchen  Her pregnancy status is negative.  She presents complaining of Abdominal Pain and Vaginal Discharge . Onset is described as ongoing and has been present for  3 weeks.  Obstetrical/Gynecological History: OB History    Grav Para Term Preterm Abortions TAB SAB Ect Mult Living   2 2              Past Medical History: Past Medical History  Diagnosis Date  . Cholecystitis 07/16/10  . Hypertension     Past Surgical History: Past Surgical History  Procedure Date  . Cholecystectomy     Family History: History reviewed. No pertinent family history.  Social History: History  Substance Use Topics  . Smoking status: Current Some Day Smoker    Types: Cigars  . Smokeless tobacco: Never Used  . Alcohol Use: No    Allergies:  Allergies  Allergen Reactions  . Stadol (Butorphanol Tartrate) Itching    No prescriptions prior to admission    Review of Systems - History obtained from chart review and the patient General ROS: negative for - chills or fever Respiratory ROS: no cough, shortness of breath, or wheezing Cardiovascular ROS: no chest pain or dyspnea on exertion Gastrointestinal ROS: positive for - abdominal pain and nausea negative for - appetite loss or vomiting Genito-Urinary ROS: no dysuria, trouble voiding, or hematuria positive for - genital discharge, pelvic pain and vulvar/vaginal symptoms negative for - genital ulcers or urinary frequency/urgency  Physical Exam   Blood pressure 155/78, pulse 70, temperature 98.6 F (37 C), temperature source Oral, resp. rate 18, height 5\' 2"  (1.575 m), weight 244 lb 9.6 oz (110.95 kg), last menstrual period 08/27/2011.  General: General appearance - alert, well appearing, and in no distress, oriented to person, place,  and time and uncomfortable appearing Mental status - alert, oriented to person, place, and time, normal mood, behavior, speech, dress, motor activity, and thought processes, anxious, teary Abdomen - soft, nontender, nondistended, no masses or organomegaly Extremities - peripheral pulses normal, no pedal edema, no clubbing or cyanosis Focused Gynecological Exam: VULVA: normal appearing vulva with no masses, tenderness or lesions, VAGINA: normal appearing vagina with normal color and discharge, no lesions, CERVIX: cervical motion tenderness present, UTERUS: + tender, ADNEXA: normal adnexa in size, nontender and no masses  Labs: Recent Results (from the past 24 hour(s))  URINALYSIS, ROUTINE W REFLEX MICROSCOPIC   Collection Time   09/30/11  8:03 PM      Component Value Range   Color, Urine YELLOW  YELLOW   APPearance CLEAR  CLEAR   Specific Gravity, Urine 1.020  1.005 - 1.030   pH 5.5  5.0 - 8.0   Glucose, UA NEGATIVE  NEGATIVE mg/dL   Hgb urine dipstick NEGATIVE  NEGATIVE   Bilirubin Urine NEGATIVE  NEGATIVE   Ketones, ur NEGATIVE  NEGATIVE mg/dL   Protein, ur NEGATIVE  NEGATIVE mg/dL   Urobilinogen, UA 0.2  0.0 - 1.0 mg/dL   Nitrite NEGATIVE  NEGATIVE   Leukocytes, UA NEGATIVE  NEGATIVE  POCT PREGNANCY, URINE   Collection Time   09/30/11  8:35 PM      Component Value Range   Preg Test, Ur NEGATIVE  NEGATIVE  WET PREP, GENITAL   Collection Time   09/30/11  9:58 PM  Component Value Range   Yeast Wet Prep HPF POC NONE SEEN  NONE SEEN   Trich, Wet Prep NONE SEEN  NONE SEEN   Clue Cells Wet Prep HPF POC FEW (*) NONE SEEN   WBC, Wet Prep HPF POC FEW (*) NONE SEEN  CBC   Collection Time   09/30/11 10:13 PM      Component Value Range   WBC 9.3  4.0 - 10.5 K/uL   RBC 4.33  3.87 - 5.11 MIL/uL   Hemoglobin 10.8 (*) 12.0 - 15.0 g/dL   HCT 16.1 (*) 09.6 - 04.5 %   MCV 75.3 (*) 78.0 - 100.0 fL   MCH 24.9 (*) 26.0 - 34.0 pg   MCHC 33.1  30.0 - 36.0 g/dL   RDW 40.9  81.1 - 91.4 %    Platelets 226  150 - 400 K/uL   Imaging Studies:  US Transvaginal Non-ob  09/14/2011  *RADIOLOGY REPORT*  Clinical Data:  Pelvic pain.  TRANSABDOMINAL AND TRANSVAGINAL ULTRASOUND OF PELVIS DOPPLER ULTRASOUND OF OVARIES  Technique:  Both transabdominal and transvaginal ultrasound examinations of the pelvis were performed. Transabdominal technique was performed for global imaging of the pelvis including uterus, ovaries, adnexal regions, and pelvic cul-de-sac.  It was necessary to proceed with endovaginal exam following the transabdominal exam to visualize the endometrium and ovaries.  Color and duplex Doppler ultrasound was utilized to evaluate blood flow to the ovaries.  Comparison:  CT abdomen and pelvis 07/28/2011  Findings:  Uterus:  The uterus is anteverted and measures 8.1 x 4 x 5 cm. Normal homogeneous parenchymal echotexture without focal mass lesions.  Endometrium:  Normal endometrial stripe thickness measured at about 7 mm.  No endometrial fluid collections.  Right ovary: Right ovary measures 2.9 x 1.5 x 2.2 cm.  Normal follicular changes.  Normal flow on color flow Doppler imaging.  Left ovary:   Left ovary measures 3.5 x 2.1 x 2.8 cm.  Normal follicular changes.  Normal flow on color flow Doppler imaging.  Pulsed Doppler evaluation demonstrates normal arterial and venous flow velocity waveform patterns in both ovaries.  No free pelvic fluid collections.  IMPRESSION: Normal exam.  No evidence of pelvic mass or other significant abnormality.  No sonographic evidence for ovarian torsion.  Original Report Authenticated By: Marlon Pel, M.D.   US Pelvis Complete  09/14/2011  *RADIOLOGY REPORT*  Clinical Data:  Pelvic pain.  TRANSABDOMINAL AND TRANSVAGINAL ULTRASOUND OF PELVIS DOPPLER ULTRASOUND OF OVARIES  Technique:  Both transabdominal and transvaginal ultrasound examinations of the pelvis were performed. Transabdominal technique was performed for global imaging of the pelvis including uterus,  ovaries, adnexal regions, and pelvic cul-de-sac.  It was necessary to proceed with endovaginal exam following the transabdominal exam to visualize the endometrium and ovaries.  Color and duplex Doppler ultrasound was utilized to evaluate blood flow to the ovaries.  Comparison:  CT abdomen and pelvis 07/28/2011  Findings:  Uterus:  The uterus is anteverted and measures 8.1 x 4 x 5 cm. Normal homogeneous parenchymal echotexture without focal mass lesions.  Endometrium:  Normal endometrial stripe thickness measured at about 7 mm.  No endometrial fluid collections.  Right ovary: Right ovary measures 2.9 x 1.5 x 2.2 cm.  Normal follicular changes.  Normal flow on color flow Doppler imaging.  Left ovary:   Left ovary measures 3.5 x 2.1 x 2.8 cm.  Normal follicular changes.  Normal flow on color flow Doppler imaging.  Pulsed Doppler evaluation demonstrates normal arterial and venous  flow velocity waveform patterns in both ovaries.  No free pelvic fluid collections.  IMPRESSION: Normal exam.  No evidence of pelvic mass or other significant abnormality.  No sonographic evidence for ovarian torsion.  Original Report Authenticated By: Marlon Pel, M.D.   [place] Art/ven Flow Abd Pelv Doppler  09/14/2011  *RADIOLOGY REPORT*  Clinical Data:  Pelvic pain.  TRANSABDOMINAL AND TRANSVAGINAL ULTRASOUND OF PELVIS DOPPLER ULTRASOUND OF OVARIES  Technique:  Both transabdominal and transvaginal ultrasound examinations of the pelvis were performed. Transabdominal technique was performed for global imaging of the pelvis including uterus, ovaries, adnexal regions, and pelvic cul-de-sac.  It was necessary to proceed with endovaginal exam following the transabdominal exam to visualize the endometrium and ovaries.  Color and duplex Doppler ultrasound was utilized to evaluate blood flow to the ovaries.  Comparison:  CT abdomen and pelvis 07/28/2011  Findings:  Uterus:  The uterus is anteverted and measures 8.1 x 4 x 5 cm. Normal homogeneous  parenchymal echotexture without focal mass lesions.  Endometrium:  Normal endometrial stripe thickness measured at about 7 mm.  No endometrial fluid collections.  Right ovary: Right ovary measures 2.9 x 1.5 x 2.2 cm.  Normal follicular changes.  Normal flow on color flow Doppler imaging.  Left ovary:   Left ovary measures 3.5 x 2.1 x 2.8 cm.  Normal follicular changes.  Normal flow on color flow Doppler imaging.  Pulsed Doppler evaluation demonstrates normal arterial and venous flow velocity waveform patterns in both ovaries.  No free pelvic fluid collections.  IMPRESSION: Normal exam.  No evidence of pelvic mass or other significant abnormality.  No sonographic evidence for ovarian torsion.  Original Report Authenticated By: Marlon Pel, M.D.   ED Course: Review of previous labs reveal positive Chlamydia on 09/13/11. Pt reports untreated. Pt and partner informed of + results. Partner referral for treatment.    Assessment: 1. PID (acute pelvic inflammatory disease)     Plan: Discharge home Rocephin in MAU Rx Doxy, Flagyl, Diclofenac, Vicodin sent to pharmacy FU in clinic, staff will call with date/time appt  Trianna Lupien E. 09/30/2011,11:57 PM

## 2011-09-30 NOTE — MAU Note (Signed)
Pt LMP 08/27/2011, had bleeding x 1 day 09/20/2011.  Having lower abd pain x 2 days and vag discharge x 1 week with odor.

## 2011-10-01 LAB — GC/CHLAMYDIA PROBE AMP, GENITAL: GC Probe Amp, Genital: NEGATIVE

## 2011-10-15 ENCOUNTER — Ambulatory Visit (INDEPENDENT_AMBULATORY_CARE_PROVIDER_SITE_OTHER): Payer: Self-pay | Admitting: Obstetrics & Gynecology

## 2011-10-15 ENCOUNTER — Encounter: Payer: Self-pay | Admitting: Obstetrics & Gynecology

## 2011-10-15 VITALS — BP 154/95 | HR 62 | Temp 98.1°F | Wt 243.8 lb

## 2011-10-15 DIAGNOSIS — I10 Essential (primary) hypertension: Secondary | ICD-10-CM

## 2011-10-15 DIAGNOSIS — N730 Acute parametritis and pelvic cellulitis: Secondary | ICD-10-CM

## 2011-10-15 MED ORDER — DOXYCYCLINE HYCLATE 100 MG PO CAPS: 100.0000 mg | ORAL_CAPSULE | Freq: Two times a day (BID) | ORAL | Status: AC

## 2011-10-15 MED ORDER — HYDROCHLOROTHIAZIDE 25 MG PO TABS: 25.0000 mg | ORAL_TABLET | Freq: Every day | ORAL | Status: DC

## 2011-10-15 MED ORDER — AZITHROMYCIN 250 MG PO TABS: 1000.0000 mg | ORAL_TABLET | Freq: Once | ORAL | Status: DC

## 2011-10-15 MED ORDER — AZITHROMYCIN 250 MG PO TABS
1000.0000 mg | ORAL_TABLET | Freq: Once | ORAL | Status: AC
  Administered 2011-10-15: 1000 mg via ORAL

## 2011-10-15 MED ORDER — DICLOFENAC SODIUM 75 MG PO TBEC: 75.0000 mg | DELAYED_RELEASE_TABLET | Freq: Two times a day (BID) | ORAL | Status: DC

## 2011-10-15 MED ORDER — AMLODIPINE BESYLATE 10 MG PO TABS: 10.0000 mg | ORAL_TABLET | Freq: Every day | ORAL | Status: DC

## 2011-10-15 MED ORDER — METRONIDAZOLE 500 MG PO TABS: 500.0000 mg | ORAL_TABLET | Freq: Two times a day (BID) | ORAL | Status: AC

## 2011-10-15 NOTE — Patient Instructions (Signed)
Pelvic Inflammatory Disease  Pelvic Inflammatory Disease (PID) is an infection in some or all of your female organs. This includes the womb (uterus), ovaries, fallopian tubes and tissues in the pelvis. PID is a common cause of sudden onset (acute) lower abdominal (pelvic) pain. PID can be treated, but it is a serious infection. It may take weeks before you are completely well. In some cases, hospitalization is needed for surgery or to administer medications to kill germs (antibiotics) through your veins (intravenously).  CAUSES    It may be caused by germs that are spread during sexual contact.   PID can also occur following:   The birth of a baby.   A miscarriage.   An abortion.   Major surgery of the pelvis.   Use of an IUD.   Sexual assault.  SYMPTOMS    Abdominal or pelvic pain.   Fever.   Chills.   Abnormal vaginal discharge.  DIAGNOSIS   Your caregiver will choose some of these methods to make a diagnosis:   A physical exam and history.   Blood tests.   Cultures of the vagina and cervix.   X-rays or ultrasound.   A procedure to look inside the pelvis (laparoscopy).  TREATMENT    Use of antibiotics by mouth or intravenously.   Treatment of sexual partners when the infection is an sexually transmitted disease (STD).   Hospitalization and surgery may be needed.  RISKS AND COMPLICATIONS    PID can cause women to become unable to have children (sterile) if left untreated or if partially treated. That is why it is important to finish all medications given to you.   Sterility or future tubal (ectopic) pregnancies can occur in fully treated individuals. This is why it is so important to follow your prescribed treatment.   It can cause longstanding (chronic) pelvic pain after frequent infections.   Painful intercourse.   Pelvic abscesses.   In rare cases, surgery or a hysterectomy may be needed.   If this is a sexually transmitted infection (STI), you are also at risk for any other STD  including AIDSor human papillomavirus (HPV).  HOME CARE INSTRUCTIONS    Finish all medication as prescribed. Incomplete treatment will put you at risk for sterility and tubal pregnancy.   Only take over-the-counter or prescription medicines for pain, discomfort, or fever as directed by your caregiver.   Do not have sex until treatment is completed or as directed by your caregiver. If PID is confirmed, your recent sexual contacts will need treatment.   Keep your follow-up appointments.  SEEK MEDICAL CARE IF:    You have increased or abnormal vaginal discharge.   You need prescription medication for your pain.   Your partner has an STD.   You are vomiting.   You cannot take your medications.  SEEK IMMEDIATE MEDICAL CARE IF:    You have a fever.   You develop increased abdominal or pelvic pain.   You develop chills.   You have pain when you urinate.   You are not better after 72 hours following treatment.  Document Released: 01/26/2005 Document Revised: 01/15/2011 Document Reviewed: 10/09/2006  [employer] Patient Information 2012 [employer].

## 2011-10-15 NOTE — Progress Notes (Signed)
History:  26 y.o. G2P2 here today for followup for PID. Was seen in the MAu on 09/30/11 for pelvic pain and vaginal discharge; lab evaluation revealed that patient had gonorrhea and bacterial vaginosis. Her symptoms and lab findings were consistent with PID. Normal pelvic ultrasound. Patient was treated with Rocephin, and given Rx for Doxycycline and Flagyl. She was also prescribed Diclofenac for pain, Norvasc for HTN.  Today, patient reports that she did not fill her prescriptions for Flagyl, Doxycycline, Diclofenac and Norvasc secondary to cost.    The following portions of the patient's history were reviewed and updated as appropriate: allergies, current medications, past family history, past medical history, past social history, past surgical history and problem list. Patient reports having normal pap smear in 03/2011.  Review of Systems:  A comprehensive review of systems was negative.  Objective:  Physical Exam Weight 243 lb 12.8 oz (110.587 kg), last menstrual period 10/14/2011. Gen: NAD Abd: Soft, nontender and nondistended Pelvic: Deferred  Assessment & Plan:  Azithromycin 1 g po x 1 dose given here in clinic Patient encouraged to fill other prescriptions, maybe at [facility] where antibiotics are covered. Routine preventative health maintenance measures emphasized

## 2011-10-15 NOTE — Progress Notes (Signed)
Here for follow up, states due to no insurance  Has not filled Diclofenac or Norvasc prescriptions, will get insurance next month as just got a new job. Encouraged her to call Family Medicine and ask for cheaper alternative on [facility] $4 list. Also states hasn't gotten the STD /PID medicne she was prescribed 2 weeks ago(doxycycline and flagyl) or the inflammation medicine due to lack of money.

## 2012-03-18 ENCOUNTER — Encounter: Payer: Self-pay | Admitting: Family Medicine

## 2012-03-18 ENCOUNTER — Other Ambulatory Visit (HOSPITAL_COMMUNITY)
Admission: RE | Admit: 2012-03-18 | Discharge: 2012-03-18 | Disposition: A | Discharge status: Home / Self Care | Payer: Medicaid Other | Source: Ambulatory Visit | Attending: Family Medicine | Admitting: Family Medicine

## 2012-03-18 ENCOUNTER — Telehealth: Payer: Self-pay | Admitting: Family Medicine

## 2012-03-18 ENCOUNTER — Ambulatory Visit (INDEPENDENT_AMBULATORY_CARE_PROVIDER_SITE_OTHER): Payer: Medicaid Other | Admitting: Family Medicine

## 2012-03-18 VITALS — BP 147/97 | HR 65 | Ht 62.0 in | Wt 237.0 lb

## 2012-03-18 DIAGNOSIS — R300 Dysuria: Secondary | ICD-10-CM

## 2012-03-18 DIAGNOSIS — R0789 Other chest pain: Secondary | ICD-10-CM

## 2012-03-18 DIAGNOSIS — R109 Unspecified abdominal pain: Secondary | ICD-10-CM | POA: Insufficient documentation

## 2012-03-18 DIAGNOSIS — N760 Acute vaginitis: Secondary | ICD-10-CM

## 2012-03-18 DIAGNOSIS — Z113 Encounter for screening for infections with a predominantly sexual mode of transmission: Secondary | ICD-10-CM | POA: Insufficient documentation

## 2012-03-18 DIAGNOSIS — I10 Essential (primary) hypertension: Secondary | ICD-10-CM

## 2012-03-18 LAB — POCT WET PREP (WET MOUNT): Clue Cells Wet Prep Whiff POC: POSITIVE

## 2012-03-18 LAB — POCT URINALYSIS DIPSTICK
Glucose, UA: NEGATIVE
Spec Grav, UA: 1.01
Urobilinogen, UA: 0.2

## 2012-03-18 MED ORDER — IBUPROFEN 800 MG PO TABS: 800.0000 mg | ORAL_TABLET | Freq: Three times a day (TID) | ORAL | Status: DC | PRN

## 2012-03-18 MED ORDER — AMLODIPINE BESYLATE 10 MG PO TABS: 10.0000 mg | ORAL_TABLET | Freq: Every day | ORAL | Status: DC

## 2012-03-18 MED ORDER — METRONIDAZOLE 500 MG PO TABS: 500.0000 mg | ORAL_TABLET | Freq: Two times a day (BID) | ORAL | Status: DC

## 2012-03-18 NOTE — Progress Notes (Addendum)
  Subjective:    Patient ID: Catherine Simmons, female    DOB: 1985/12/24, 27 y.o.   MRN: [id number]  HPI Pt here with acute problems  Chest pain Onset Sunday, on L chest desc as sharp and rad to L scapula occasionally and feeling like it's under her breast No trauma Some associated shortness of breath, denies nausea, resolves spontaneously, not helped by tylenol.  Denies cough or Hx of asthma,  Feels like pain is exertional Smokes 3 -4 ciggarettes daily.  + PND   ABdominal pain Onset 1 month ago, suprapubic in location, dull and non radiating Concerned about STDS, 1 sexual partner in 6 months Hx of mult UTI- + Urgency, frequency, no dysuria Regular periods lasting 3-4 days, 4-5 pads per day, no dyspareunia   No fevers, chills, sweats + vision not as sharp as before Daily headaches relieved by tylenol Not taking BP meds for a while b/c out of Rx  Review of Systems Per hpi    Objective:   Physical Exam  Gen: NAD, alert, cooperative with exam HEENT: NCAT CV: RRR, good S1/S2, no murmur Resp: CTABL, no wheezes, non-labored Abd: SNTND, BS present, no guarding  Neuro: Alert and oriented, No gross deficits Pelvic: Thick white discahrge, no pain or palp masses on Bimanual,  MSK: no tenderness on palpof chest wall, pain reproduced with squeezing BL arms outward against resistnace      Assessment & Plan:

## 2012-03-18 NOTE — Assessment & Plan Note (Signed)
Uncontrolled, out of meds, wants to be compliant  Having headaches and complains of vision changes, some scotoma with headaches  Refill amlodipine

## 2012-03-18 NOTE — Assessment & Plan Note (Signed)
Consistent with chest wall/musculoskeletal pain.  Some rsik factors for CAD- smoking, HTN IS exertional with dyspnea but given her age and reproducibility i feel conficent it is not cardiac in origin  Ibuprofen 800 mg TID PRN, RTC if doesn't resolve  CV warning signs given.

## 2012-03-18 NOTE — Telephone Encounter (Signed)
Called to inform patient that she has bacterial vaginosis. I e-scribed flagyl 500 BID for 7 days elsewhere. Advised to avoid alcohol while taking this medicine.

## 2012-03-18 NOTE — Patient Instructions (Signed)
Your chest pain is most consistent with chostochondritis - I sent you a prescription for ibuprofen, you can take 1 every 8 hours, it should resolve in the next week or two.    I will call you with the results of your tests and send medications as you need them.   If your chest pain becomes worse, persistent, is associated with nausea and vomiting then seek medical help.

## 2012-03-18 NOTE — Assessment & Plan Note (Addendum)
Bacterial vaginosis-  Suprapubic in location, thick whit d/c, abdomen soft Wet prep loaded with WBC UA unremarkable GCC: pending, report and treat when results return  Will call pt with results and Rx flagyl 500 BID for 7 days

## 2012-03-25 ENCOUNTER — Encounter: Payer: Self-pay | Admitting: Medical

## 2012-04-04 ENCOUNTER — Encounter: Payer: Self-pay | Admitting: Advanced Practice Midwife

## 2012-04-21 ENCOUNTER — Encounter: Payer: Medicaid Other | Admitting: Advanced Practice Midwife

## 2012-05-08 ENCOUNTER — Emergency Department (HOSPITAL_COMMUNITY)
Admission: EM | Admit: 2012-05-08 | Discharge: 2012-05-08 | Disposition: A | Discharge status: Home / Self Care | Payer: Medicaid Other | Attending: Emergency Medicine | Admitting: Emergency Medicine

## 2012-05-08 ENCOUNTER — Encounter (HOSPITAL_COMMUNITY): Payer: Self-pay | Admitting: Emergency Medicine

## 2012-05-08 ENCOUNTER — Emergency Department (HOSPITAL_COMMUNITY): Payer: Medicaid Other

## 2012-05-08 DIAGNOSIS — F1010 Alcohol abuse, uncomplicated: Secondary | ICD-10-CM | POA: Insufficient documentation

## 2012-05-08 DIAGNOSIS — R51 Headache: Secondary | ICD-10-CM | POA: Insufficient documentation

## 2012-05-08 DIAGNOSIS — Z3202 Encounter for pregnancy test, result negative: Secondary | ICD-10-CM | POA: Insufficient documentation

## 2012-05-08 DIAGNOSIS — R112 Nausea with vomiting, unspecified: Secondary | ICD-10-CM | POA: Insufficient documentation

## 2012-05-08 DIAGNOSIS — Z8719 Personal history of other diseases of the digestive system: Secondary | ICD-10-CM | POA: Insufficient documentation

## 2012-05-08 DIAGNOSIS — R079 Chest pain, unspecified: Secondary | ICD-10-CM | POA: Insufficient documentation

## 2012-05-08 DIAGNOSIS — R197 Diarrhea, unspecified: Secondary | ICD-10-CM

## 2012-05-08 DIAGNOSIS — I10 Essential (primary) hypertension: Secondary | ICD-10-CM | POA: Insufficient documentation

## 2012-05-08 DIAGNOSIS — R0602 Shortness of breath: Secondary | ICD-10-CM | POA: Insufficient documentation

## 2012-05-08 DIAGNOSIS — Z79899 Other long term (current) drug therapy: Secondary | ICD-10-CM | POA: Insufficient documentation

## 2012-05-08 DIAGNOSIS — Z87891 Personal history of nicotine dependence: Secondary | ICD-10-CM | POA: Insufficient documentation

## 2012-05-08 LAB — URINE MICROSCOPIC-ADD ON

## 2012-05-08 LAB — URINALYSIS, ROUTINE W REFLEX MICROSCOPIC
Glucose, UA: NEGATIVE mg/dL
Hgb urine dipstick: NEGATIVE
Ketones, ur: NEGATIVE mg/dL
Protein, ur: NEGATIVE mg/dL
Urobilinogen, UA: 1 mg/dL (ref 0.0–1.0)

## 2012-05-08 LAB — BASIC METABOLIC PANEL
BUN: 12 mg/dL (ref 6–23)
CO2: 25 mEq/L (ref 19–32)
Calcium: 8.8 mg/dL (ref 8.4–10.5)
Creatinine, Ser: 0.6 mg/dL (ref 0.50–1.10)
Glucose, Bld: 117 mg/dL — ABNORMAL HIGH (ref 70–99)

## 2012-05-08 LAB — CBC
Hemoglobin: 10.8 g/dL — ABNORMAL LOW (ref 12.0–15.0)
MCH: 24.9 pg — ABNORMAL LOW (ref 26.0–34.0)
MCV: 73 fL — ABNORMAL LOW (ref 78.0–100.0)
RBC: 4.34 MIL/uL (ref 3.87–5.11)

## 2012-05-08 IMAGING — CR DG CHEST 2V
2 series · 2 of 2 positions shown · non-contrast
Comparison: [DATE]

CLINICAL DATA: Chest pain, prior smoker, hypertension

CHEST - 2 VIEW

[w chest pa]
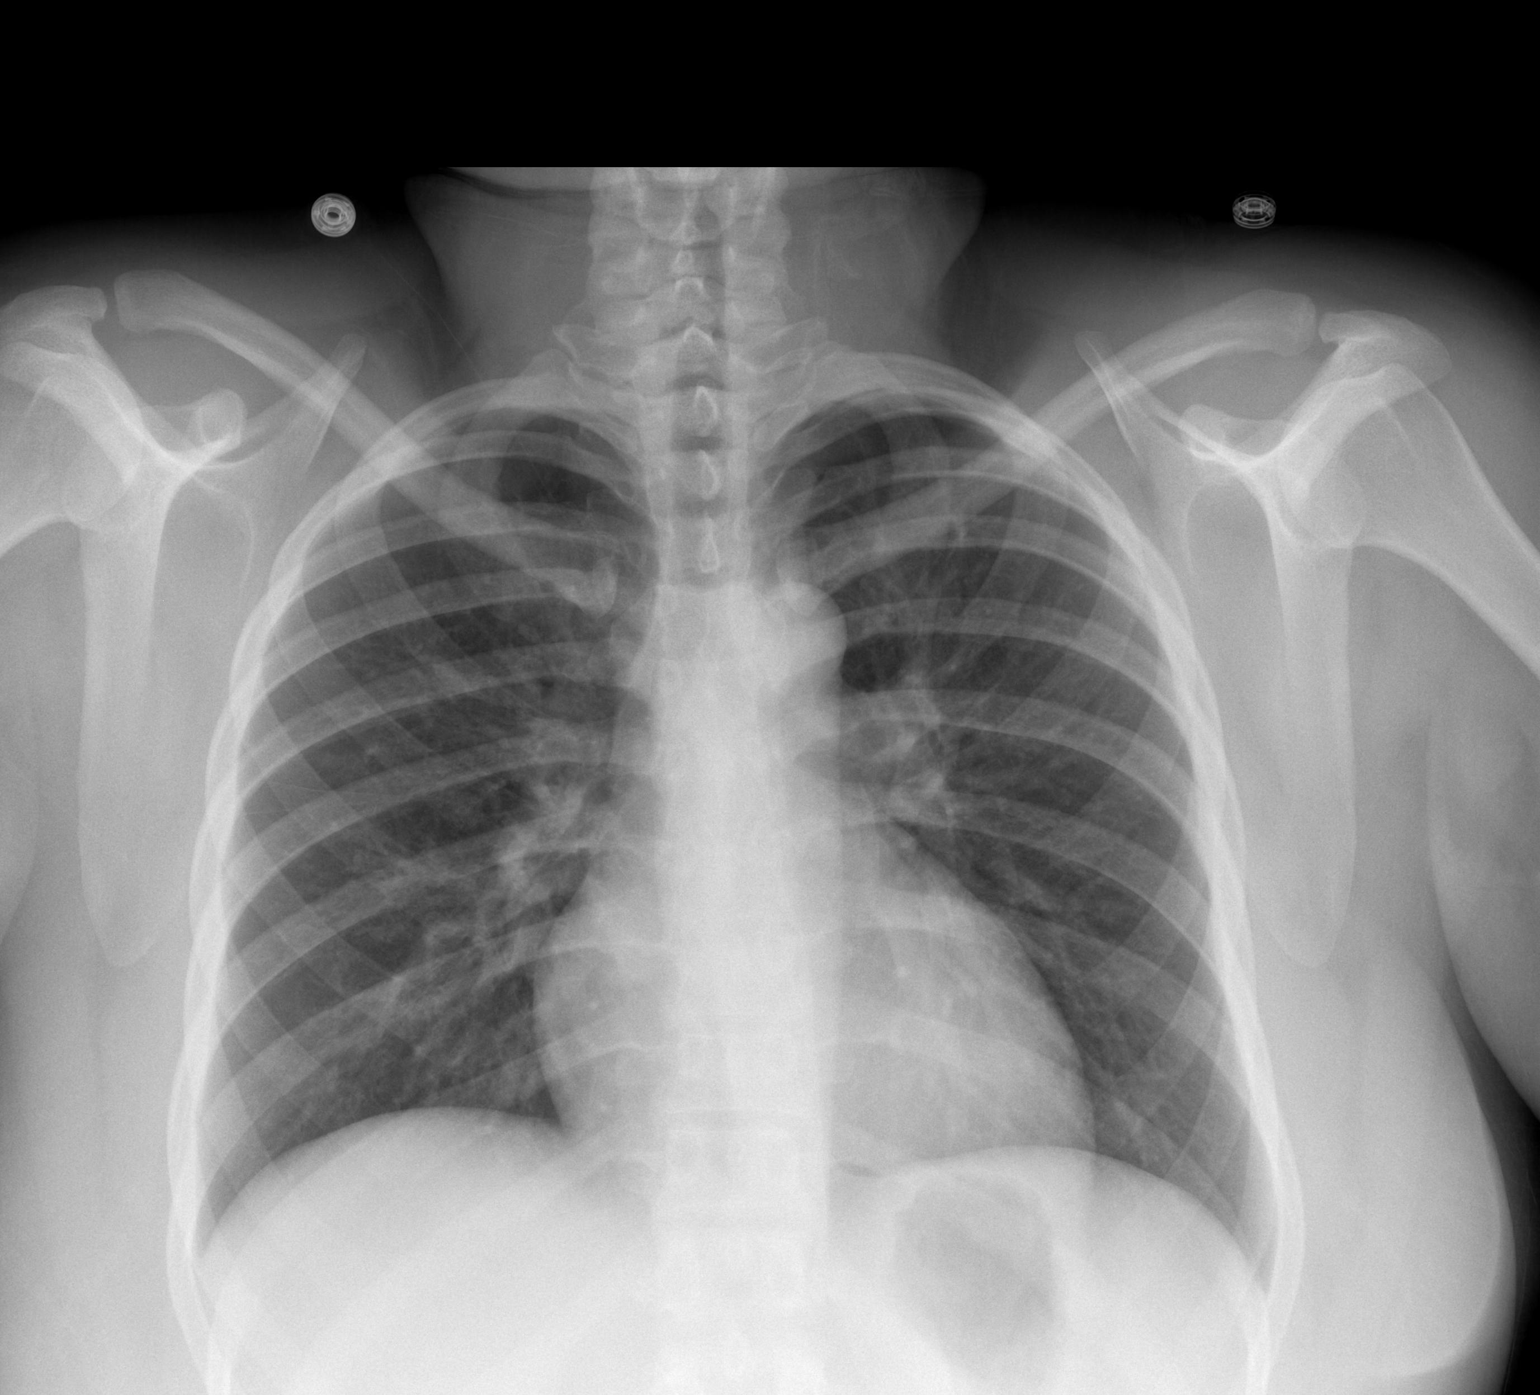

[w chest lat]
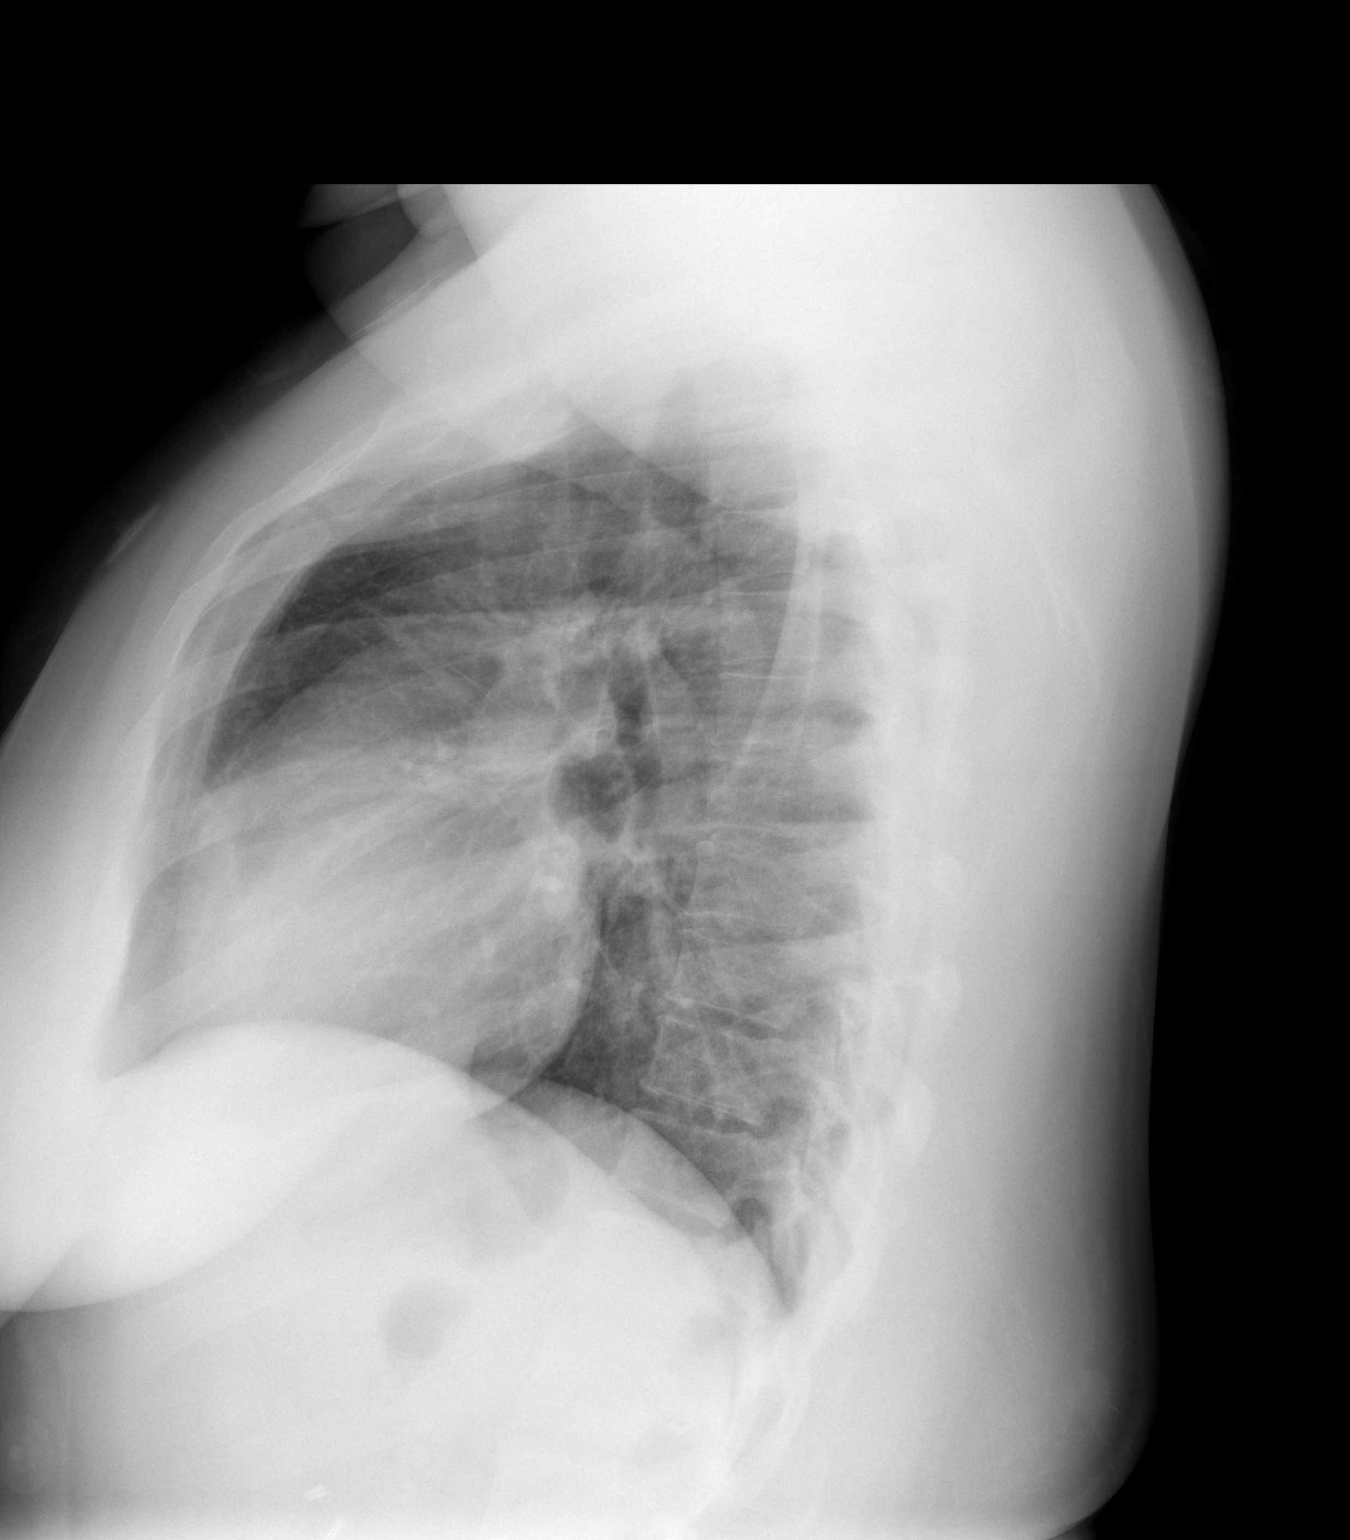

[2 of 2 positions shown; findings below may reference images not displayed]

FINDINGS: Normal heart size and vascularity.  Minimal central
bronchitic changes can be seen with smoking versus bronchitis.  No
focal pneumonia, collapse consolidation.  No effusion or
pneumothorax.  Trachea midline.  No osseous abnormality.  Prior
cholecystectomy noted.
IMPRESSION: Central bronchitic changes.

## 2012-05-08 MED ORDER — ONDANSETRON 4 MG PO TBDP
8.0000 mg | ORAL_TABLET | Freq: Once | ORAL | Status: AC
  Administered 2012-05-08: 8 mg via ORAL
  Filled 2012-05-08: qty 2

## 2012-05-08 MED ORDER — PROMETHAZINE HCL 12.5 MG PO TABS: 12.5000 mg | ORAL_TABLET | Freq: Four times a day (QID) | ORAL | Status: DC | PRN

## 2012-05-08 MED ORDER — HYDROCODONE-ACETAMINOPHEN 5-325 MG PO TABS
2.0000 | ORAL_TABLET | Freq: Once | ORAL | Status: AC
  Administered 2012-05-08: 2 via ORAL
  Filled 2012-05-08: qty 2

## 2012-05-08 NOTE — ED Notes (Signed)
C/o headache, sob, and L sided stabbing chest pain since 9pm last night.  Reports nausea, vomiting, and diarrhea since 2am.

## 2012-05-08 NOTE — ED Notes (Signed)
Pt c/o N/V/D and headache rated 10/10.  Pt laying in bed with baby on her chest and tv volume is loud.  Pt appears to be in NAD.

## 2012-05-08 NOTE — ED Provider Notes (Signed)
History     CSN: [id number]  Arrival date & time 05/08/12  0919   First MD Initiated Contact with Patient 05/08/12 1235      Chief Complaint  Patient presents with  . Headache  . Emesis  . Chest Pain    (Consider location/radiation/quality/duration/timing/severity/associated sxs/prior treatment) HPI Comments: This is a 27 year old patient who is complaining of left sided severe sharp stabbing chest pain that sometimes radiates to her back. She has a headache, nausea, vomiting, and diarrhea that started at 2am. The headache is on the top of her head. She is having some shortness of breath worse when laying down, not relieved by sitting up. She has never had pain like this before. She took ibuprofen with no relief. No recreational drug use. Occasional alcohol use. No fever, abdominal pain, rash, cough, hemoptysis, recent illness.   The history is provided by the patient. No language interpreter was used.    Past Medical History  Diagnosis Date  . Cholecystitis 07/16/10  . Hypertension     Past Surgical History  Procedure Laterality Date  . Cholecystectomy      No family history on file.  History  Substance Use Topics  . Smoking status: Former Smoker    Types: Cigars    Start date: 03/11/2012  . Smokeless tobacco: Never Used  . Alcohol Use: No    OB History   Grav Para Term Preterm Abortions TAB SAB Ect Mult Living   2 2              Review of Systems  Constitutional: Negative for fever and chills.  Respiratory: Positive for shortness of breath. Negative for cough.   Cardiovascular: Positive for chest pain.  Skin: Negative for rash.  All other systems reviewed and are negative.    Allergies  Stadol  Home Medications   Current Outpatient Rx  Name  Route  Sig  Dispense  Refill  . acetaminophen (TYLENOL) 500 MG tablet   Oral   Take 1,000 mg by mouth every 6 (six) hours as needed. Stomach pains         . amLODipine (NORVASC) 10 MG tablet   Oral   Take  1 tablet (10 mg total) by mouth daily.   30 tablet   11   . diclofenac (VOLTAREN) 75 MG EC tablet   Oral   Take 1 tablet (75 mg total) by mouth 2 (two) times daily.   30 tablet   2   . hydrochlorothiazide (HYDRODIURIL) 25 MG tablet   Oral   Take 25 mg by mouth daily.         . hydrochlorothiazide (HYDRODIURIL) 25 MG tablet   Oral   Take 1 tablet (25 mg total) by mouth daily.   30 tablet   3   . ibuprofen (ADVIL,MOTRIN) 800 MG tablet   Oral   Take 1 tablet (800 mg total) by mouth every 8 (eight) hours as needed for pain.   30 tablet   0   . metroNIDAZOLE (FLAGYL) 500 MG tablet   Oral   Take 1 tablet (500 mg total) by mouth 2 (two) times daily.   14 tablet   0     BP 157/103  Pulse 71  Temp(Src) 98.5 F (36.9 C) (Oral)  Resp 22  SpO2 100%  LMP 04/22/2012  Physical Exam  Nursing note and vitals reviewed. Constitutional: She is oriented to person, place, and time. She appears well-developed and well-nourished. No distress.  Baby  on lap, NAD  HENT:  Head: Normocephalic and atraumatic.  Right Ear: External ear normal.  Left Ear: External ear normal.  Nose: Nose normal.  Mouth/Throat: Oropharynx is clear and moist.  Eyes: Conjunctivae are normal.  Neck: Normal range of motion.  Cardiovascular: Normal rate, regular rhythm and normal heart sounds.   Pulmonary/Chest: Effort normal and breath sounds normal. No stridor. No respiratory distress. She has no wheezes. She has no rales. She exhibits no tenderness.  Abdominal: Soft. She exhibits no distension. There is no tenderness.  Musculoskeletal: Normal range of motion.  Neurological: She is alert and oriented to person, place, and time. She has normal strength.  Skin: Skin is warm and dry. No rash noted. She is not diaphoretic. No erythema.  Psychiatric: She has a normal mood and affect. Her behavior is normal.    ED Course  Procedures (including critical care time)  Labs Reviewed  CBC - Abnormal; Notable for  the following:    Hemoglobin 10.8 (*)    HCT 31.7 (*)    MCV 73.0 (*)    MCH 24.9 (*)    All other components within normal limits  BASIC METABOLIC PANEL - Abnormal; Notable for the following:    Glucose, Bld 117 (*)    All other components within normal limits  PRO B NATRIURETIC PEPTIDE - Abnormal; Notable for the following:    Pro B Natriuretic peptide (BNP) 180.3 (*)    All other components within normal limits  URINALYSIS, ROUTINE W REFLEX MICROSCOPIC - Abnormal; Notable for the following:    APPearance HAZY (*)    Leukocytes, UA TRACE (*)    All other components within normal limits  URINE MICROSCOPIC-ADD ON - Abnormal; Notable for the following:    Squamous Epithelial / LPF MANY (*)    Bacteria, UA FEW (*)    All other components within normal limits  URINE CULTURE  PREGNANCY, URINE  POCT I-STAT TROPONIN I   Dg Chest 2 View  05/08/2012  *RADIOLOGY REPORT*  Clinical Data: Chest pain, prior smoker, hypertension  CHEST - 2 VIEW  Comparison: 02/16/2010  Findings: Normal heart size and vascularity.  Minimal central bronchitic changes can be seen with smoking versus bronchitis.  No focal pneumonia, collapse consolidation.  No effusion or pneumothorax.  Trachea midline.  No osseous abnormality.  Prior cholecystectomy noted.  IMPRESSION: Central bronchitic changes.   Original Report Authenticated By: Judie Petit. Miles Costain, M.D.      Date: 05/08/2012  Rate: 66  Rhythm: normal sinus rhythm  QRS Axis: normal  Intervals: normal  ST/T Wave abnormalities: normal  Conduction Disutrbances:none  Narrative Interpretation:   Old EKG Reviewed: unchanged    1. Chest pain   2. Vomiting and diarrhea       MDM  Patient presents to ED today after sudden onset of chest pain since 9pm last night. Associated with headache, nausea, vomiting, diarrhea, and shortness of breath worse while laying down. Oxygen sat remained at 100 on RA through ED stay. EKG WNL, labs generally WNL. CXR shows bronchitic changes  probably due to smoking history. She reveals she still smokes when she goes out drinking. PERC negative. No concern for PE. No concern for endocarditis, pericarditis. Worsening orthopnea after norco given for pain. She states she will follow up with her PCP tomorrow. Discussed this case with the attending and he agrees with plan. Patient / Family / Caregiver informed of clinical course, understand medical decision-making process, and agree with plan.  Mora Bellman, PA-C 05/09/12 1058

## 2012-05-09 LAB — URINE CULTURE
Colony Count: NO GROWTH
Culture: NO GROWTH

## 2012-05-09 NOTE — ED Provider Notes (Signed)
Medical screening examination/treatment/procedure(s) were performed by non-physician practitioner and as supervising physician I was immediately available for consultation/collaboration.  Flint Melter, MD 05/09/12 [phone]

## 2012-06-01 ENCOUNTER — Ambulatory Visit: Payer: Medicaid Other | Admitting: Family Medicine

## 2012-06-02 ENCOUNTER — Ambulatory Visit (INDEPENDENT_AMBULATORY_CARE_PROVIDER_SITE_OTHER): Payer: Medicaid Other | Admitting: Family Medicine

## 2012-06-03 ENCOUNTER — Ambulatory Visit (INDEPENDENT_AMBULATORY_CARE_PROVIDER_SITE_OTHER): Payer: Medicaid Other | Admitting: Family Medicine

## 2012-06-03 ENCOUNTER — Other Ambulatory Visit (HOSPITAL_COMMUNITY)
Admission: RE | Admit: 2012-06-03 | Discharge: 2012-06-03 | Disposition: A | Discharge status: Home / Self Care | Payer: Medicaid Other | Source: Ambulatory Visit | Attending: Family Medicine | Admitting: Family Medicine

## 2012-06-03 ENCOUNTER — Encounter: Payer: Self-pay | Admitting: Family Medicine

## 2012-06-03 VITALS — BP 141/87 | HR 80 | Ht 62.0 in | Wt 233.0 lb

## 2012-06-03 DIAGNOSIS — R102 Pelvic and perineal pain: Secondary | ICD-10-CM | POA: Insufficient documentation

## 2012-06-03 DIAGNOSIS — N898 Other specified noninflammatory disorders of vagina: Secondary | ICD-10-CM

## 2012-06-03 DIAGNOSIS — Z113 Encounter for screening for infections with a predominantly sexual mode of transmission: Secondary | ICD-10-CM | POA: Insufficient documentation

## 2012-06-03 DIAGNOSIS — N72 Inflammatory disease of cervix uteri: Secondary | ICD-10-CM

## 2012-06-03 LAB — POCT WET PREP (WET MOUNT)

## 2012-06-03 MED ORDER — METRONIDAZOLE 500 MG PO TABS: 500.0000 mg | ORAL_TABLET | Freq: Two times a day (BID) | ORAL | Status: DC

## 2012-06-03 MED ORDER — IBUPROFEN 800 MG PO TABS: 800.0000 mg | ORAL_TABLET | Freq: Three times a day (TID) | ORAL | Status: DC | PRN

## 2012-06-03 MED ORDER — NORGESTIMATE-ETH ESTRADIOL 0.25-35 MG-MCG PO TABS: 1.0000 | ORAL_TABLET | Freq: Every day | ORAL | Status: DC

## 2012-06-03 NOTE — Progress Notes (Signed)
S: Pt comes in today for SDA for pelvic pain/STD check.  She reports pain since 11/2011.  She does have a history of PID, which was diagnosed in 09/2011 and treated with doxy and flagyl (+gonorrhea and BV).  She reports 1 sexual partner, but he cheats on her. Last sexual intercourse was 3-4 weeks ago.  LMP was 05/17/12, lasted for 5 days, which is typical.  No intercourse since LMP.  Pain is sharp, bilateral pelvis, feels similar to when she had PID in 09/2011.  Pain has not really changed, she came in today because she found out her boyfriend cheated on her again and she wants to make sure she didn't get something else.  She denies dysuria or vaginal discharge.  She has been using miralax because she thought the pain may be from constipation, but she has been having daily, soft BMs for a few weeks and it is not better yet.   She had a normal pelvic U/S 09/2011.   She does report having frequent and heavy periods.  Has been having q2 week periods, usually lasting 5 days, most of those days she goes through 8 pads per day.  She changes the pad because they are full but not soaked.  She has some small clots with her periods.  Her pain is significantly worse with her periods.     ROS: Per HPI  History  Smoking status  . Former Smoker  . Types: Cigars  . Start date: 03/11/2012  Smokeless tobacco  . Never Used    O:  Filed Vitals:   06/03/12 1631  BP: 141/87  Pulse: 80    Gen: NAD Pelvic: normal external genitalia, vaginal mucosa normal appears, + thick, white, malodorous d/c, cervix not well visualized; no CMT but mild tenderness over uterus and bilateral adnexa on bimanual exam, no adnexal masses appreciated but difficult due to large body habitus    A/P: 27 y.o. female p/w pelvic pain, dysmenorrhea, menometrorrhagia  -See problem list -f/u in 4-8 weeks

## 2012-06-03 NOTE — Assessment & Plan Note (Addendum)
Will try scheduled ibuprofen + OCP to control pain and bleeding. Wet prep + for BV- will treat.  GC/Chlam sent.  F/u 4-8 weeks with PCP

## 2012-06-03 NOTE — Patient Instructions (Addendum)
It was nice to meet you.  We are checking for STDs.  We will call if one is positive.   For your pain, take ibuprofen 800mg  3 times per day, every day, for the next few weeks-- we will see if this helps with the pain. I also want you to start taking the birth control pill that I sent in to the pharmacy.  Come back to see Dr. Ermalinda Memos in 4-8 weeks. Go to the ER if you get swelling or lots of pain in your calf or sudden shortness of breath.

## 2012-06-08 ENCOUNTER — Ambulatory Visit (INDEPENDENT_AMBULATORY_CARE_PROVIDER_SITE_OTHER): Payer: Medicaid Other | Admitting: *Deleted

## 2012-06-08 ENCOUNTER — Telehealth: Payer: Self-pay | Admitting: Family Medicine

## 2012-06-08 ENCOUNTER — Encounter: Payer: Self-pay | Admitting: *Deleted

## 2012-06-08 DIAGNOSIS — A64 Unspecified sexually transmitted disease: Secondary | ICD-10-CM

## 2012-06-08 MED ORDER — AZITHROMYCIN 250 MG PO TABS
1000.0000 mg | ORAL_TABLET | Freq: Once | ORAL | Status: AC
  Administered 2012-06-08: 1000 mg via ORAL

## 2012-06-08 NOTE — Progress Notes (Signed)
Pt given 500mg  x 2 (1000 mg total) as ordered per Dr. Fara Boros after allergies and birthdate verified. Pt given condoms as requested and educational info. Instructed to abstain from intercourse for 7 days and partner needs tx . Letter given for work purposes. Hilman Kissling, Harold Hedge, RN

## 2012-06-08 NOTE — Telephone Encounter (Signed)
Please treat with Azithromycin 1g po slurry.

## 2012-06-08 NOTE — Telephone Encounter (Signed)
Pt with POSITIVE CHLAMYDIA from visit 4/25. Please call pt and let her know she needs to come in for treatment. Thanks!

## 2012-06-08 NOTE — Telephone Encounter (Signed)
Pt returned call and informed of positive test results and need for treatment. Pt stated that she was on her way to the clinic for further instructions and treatment Fran Mcree, Harold Hedge, RN

## 2012-06-08 NOTE — Telephone Encounter (Signed)
Called pt , no answer  - will call back later - Anquanette Bahner, Harold Hedge, RN

## 2012-08-14 ENCOUNTER — Emergency Department (HOSPITAL_COMMUNITY)
Admission: EM | Admit: 2012-08-14 | Discharge: 2012-08-14 | Disposition: A | Discharge status: Home / Self Care | Payer: Self-pay | Attending: Emergency Medicine | Admitting: Emergency Medicine

## 2012-08-14 ENCOUNTER — Other Ambulatory Visit: Payer: Self-pay

## 2012-08-14 ENCOUNTER — Emergency Department (HOSPITAL_COMMUNITY): Payer: Self-pay

## 2012-08-14 ENCOUNTER — Encounter (HOSPITAL_COMMUNITY): Payer: Self-pay

## 2012-08-14 ENCOUNTER — Emergency Department (HOSPITAL_COMMUNITY)
Admission: EM | Admit: 2012-08-14 | Discharge: 2012-08-14 | Disposition: A | Discharge status: Home / Self Care | Payer: Self-pay

## 2012-08-14 DIAGNOSIS — R0609 Other forms of dyspnea: Secondary | ICD-10-CM | POA: Insufficient documentation

## 2012-08-14 DIAGNOSIS — I10 Essential (primary) hypertension: Secondary | ICD-10-CM | POA: Insufficient documentation

## 2012-08-14 DIAGNOSIS — R42 Dizziness and giddiness: Secondary | ICD-10-CM | POA: Insufficient documentation

## 2012-08-14 DIAGNOSIS — Z87891 Personal history of nicotine dependence: Secondary | ICD-10-CM | POA: Insufficient documentation

## 2012-08-14 DIAGNOSIS — F411 Generalized anxiety disorder: Secondary | ICD-10-CM | POA: Insufficient documentation

## 2012-08-14 DIAGNOSIS — Z8719 Personal history of other diseases of the digestive system: Secondary | ICD-10-CM | POA: Insufficient documentation

## 2012-08-14 DIAGNOSIS — R0989 Other specified symptoms and signs involving the circulatory and respiratory systems: Secondary | ICD-10-CM | POA: Insufficient documentation

## 2012-08-14 DIAGNOSIS — Z79899 Other long term (current) drug therapy: Secondary | ICD-10-CM | POA: Insufficient documentation

## 2012-08-14 DIAGNOSIS — R091 Pleurisy: Secondary | ICD-10-CM | POA: Insufficient documentation

## 2012-08-14 LAB — POCT I-STAT TROPONIN I: Troponin i, poc: 0.01 ng/mL (ref 0.00–0.08)

## 2012-08-14 LAB — CBC
HCT: 32.6 % — ABNORMAL LOW (ref 36.0–46.0)
Hemoglobin: 10.9 g/dL — ABNORMAL LOW (ref 12.0–15.0)
RBC: 4.37 MIL/uL (ref 3.87–5.11)
WBC: 7.8 10*3/uL (ref 4.0–10.5)

## 2012-08-14 LAB — BASIC METABOLIC PANEL
BUN: 9 mg/dL (ref 6–23)
CO2: 23 mEq/L (ref 19–32)
Chloride: 101 mEq/L (ref 96–112)
GFR calc non Af Amer: >90 mL/min (ref >90)
Glucose, Bld: 106 mg/dL — ABNORMAL HIGH (ref 70–99)
Potassium: 3.3 mEq/L — ABNORMAL LOW (ref 3.5–5.1)
Sodium: 135 mEq/L (ref 135–145)

## 2012-08-14 IMAGING — CR DG CHEST 2V
2 series · 2 of 2 positions shown · non-contrast
Comparison: [DATE]

CLINICAL DATA: Chest pain, shortness of breath.

CHEST - 2 VIEW

[w chest pa]
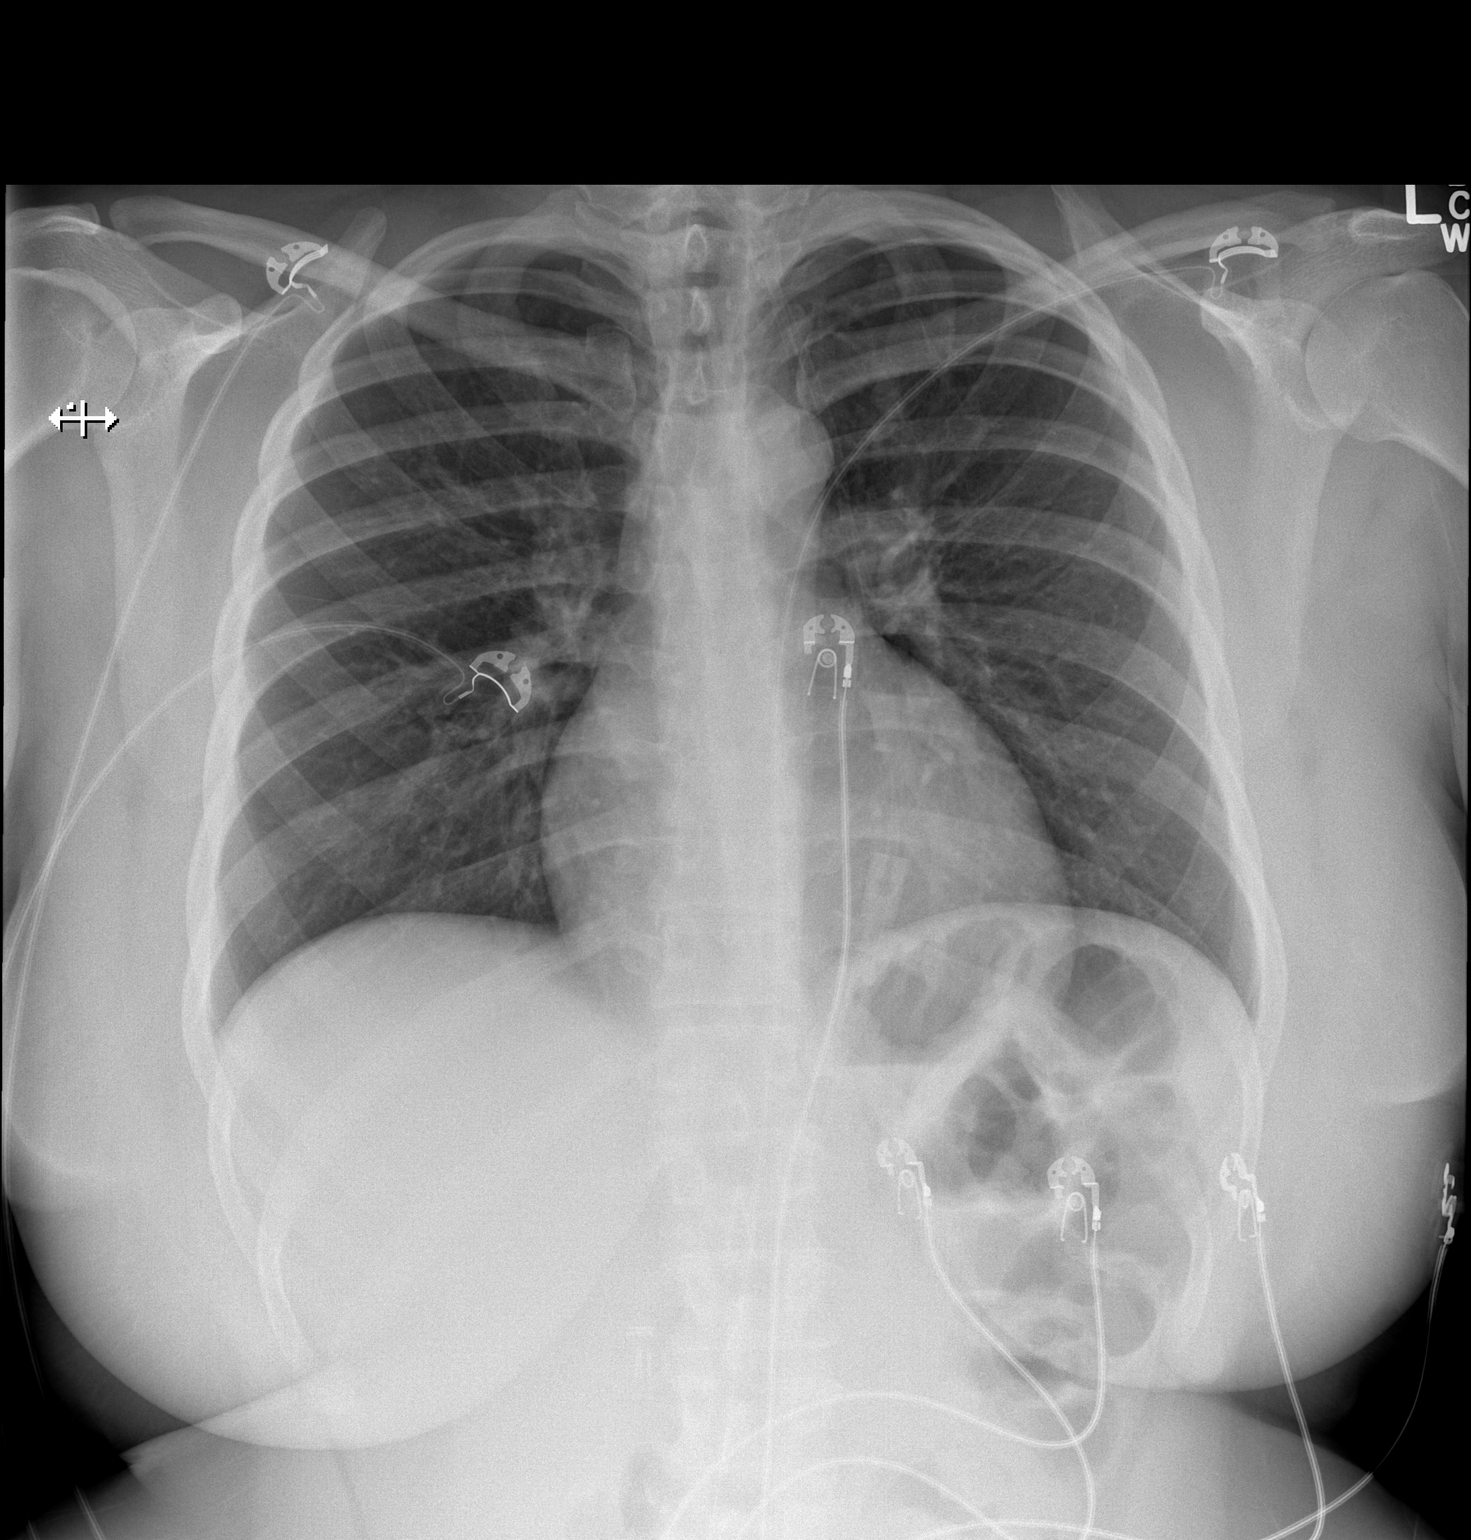

[w chest lat]
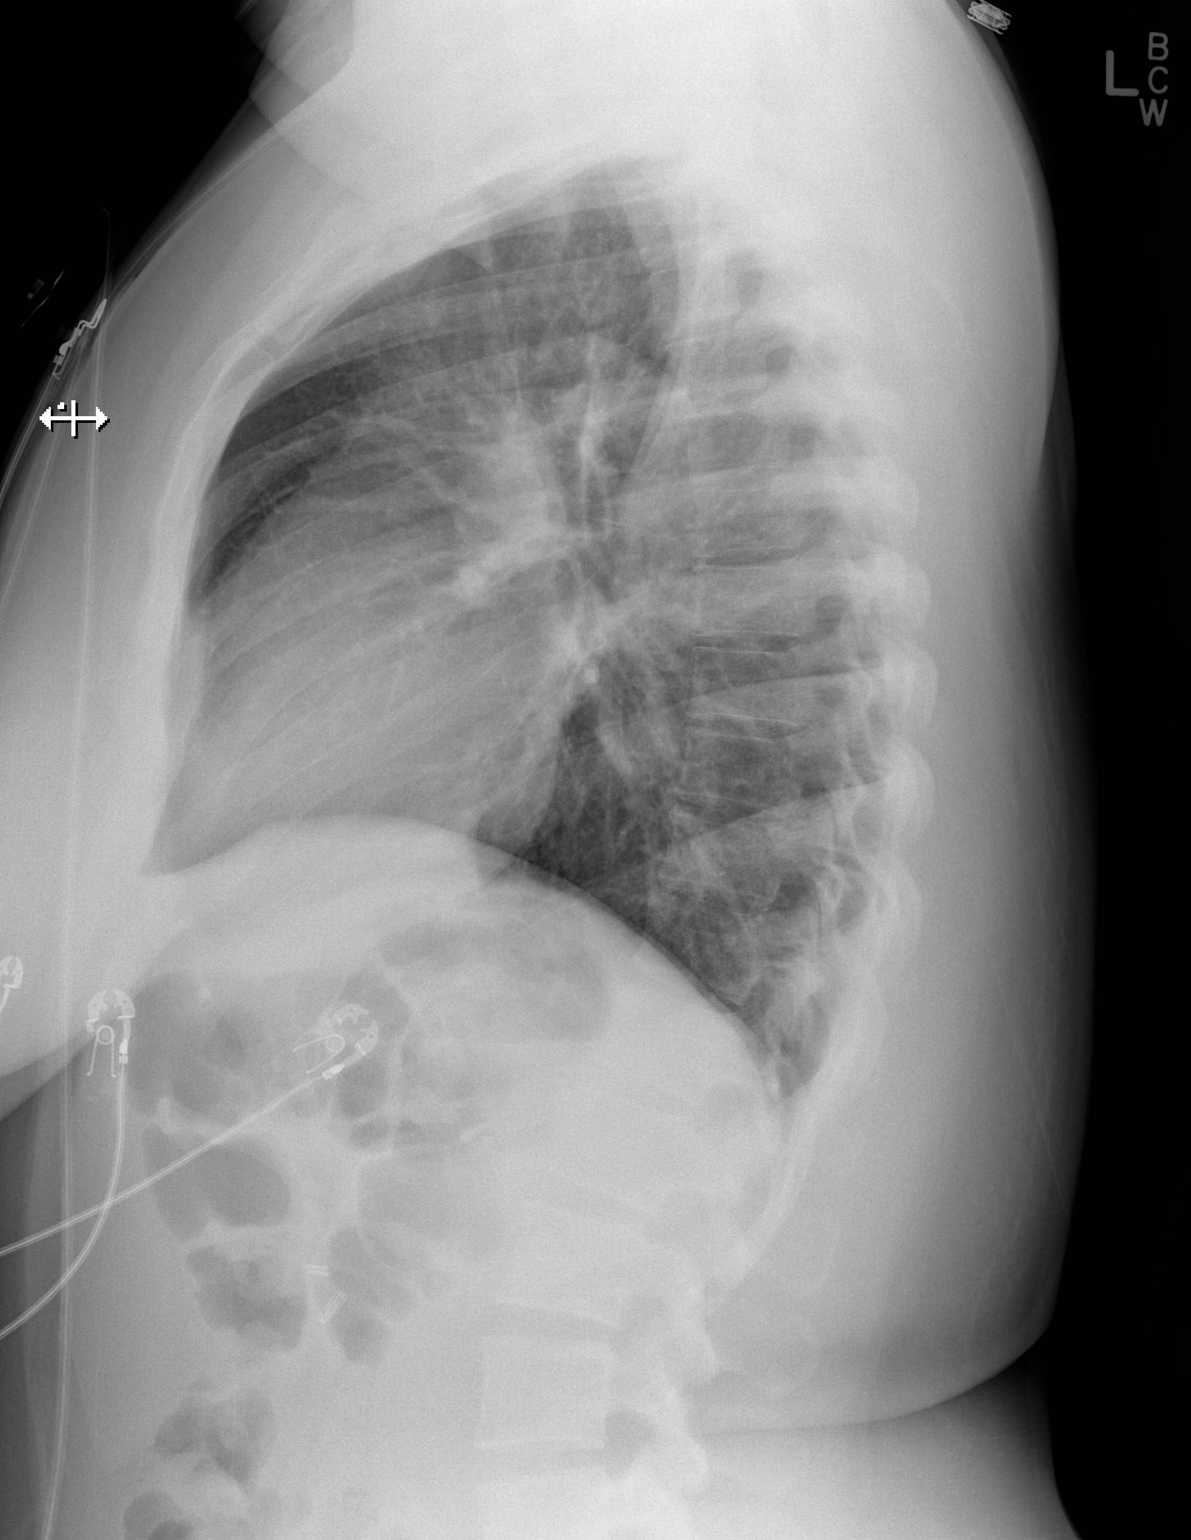

[2 of 2 positions shown; findings below may reference images not displayed]

FINDINGS: Heart and mediastinal contours are within normal limits.
No focal opacities or effusions.  No acute bony abnormality.
IMPRESSION: No active cardiopulmonary disease.

## 2012-08-14 MED ORDER — KETOROLAC TROMETHAMINE 30 MG/ML IJ SOLN
30.0000 mg | Freq: Once | INTRAMUSCULAR | Status: DC
  Filled 2012-08-14: qty 1

## 2012-08-14 MED ORDER — MORPHINE SULFATE 4 MG/ML IJ SOLN
4.0000 mg | Freq: Once | INTRAMUSCULAR | Status: DC
  Filled 2012-08-14: qty 1

## 2012-08-14 MED ORDER — METHOCARBAMOL 750 MG PO TABS: 750.0000 mg | ORAL_TABLET | Freq: Four times a day (QID) | ORAL | Status: DC

## 2012-08-14 MED ORDER — ALBUTEROL SULFATE (5 MG/ML) 0.5% IN NEBU
5.0000 mg | INHALATION_SOLUTION | Freq: Once | RESPIRATORY_TRACT | Status: AC
  Administered 2012-08-14: 5 mg via RESPIRATORY_TRACT
  Filled 2012-08-14: qty 1

## 2012-08-14 MED ORDER — OXYCODONE-ACETAMINOPHEN 5-325 MG PO TABS: 1.0000 | ORAL_TABLET | ORAL | Status: DC | PRN

## 2012-08-14 MED ORDER — IBUPROFEN 600 MG PO TABS: 600.0000 mg | ORAL_TABLET | Freq: Four times a day (QID) | ORAL | Status: DC | PRN

## 2012-08-14 MED ORDER — IPRATROPIUM BROMIDE 0.02 % IN SOLN
0.5000 mg | Freq: Once | RESPIRATORY_TRACT | Status: AC
  Administered 2012-08-14: 0.5 mg via RESPIRATORY_TRACT
  Filled 2012-08-14: qty 2.5

## 2012-08-14 MED ORDER — LORAZEPAM 2 MG/ML IJ SOLN
1.0000 mg | Freq: Once | INTRAMUSCULAR | Status: AC
  Administered 2012-08-14: 1 mg via INTRAVENOUS
  Filled 2012-08-14: qty 1

## 2012-08-14 MED ORDER — MORPHINE SULFATE 4 MG/ML IJ SOLN
4.0000 mg | Freq: Once | INTRAMUSCULAR | Status: AC
  Administered 2012-08-14: 4 mg via INTRAMUSCULAR

## 2012-08-14 MED ORDER — KETOROLAC TROMETHAMINE 60 MG/2ML IM SOLN
60.0000 mg | Freq: Once | INTRAMUSCULAR | Status: AC
  Administered 2012-08-14: 60 mg via INTRAMUSCULAR
  Filled 2012-08-14: qty 2

## 2012-08-14 NOTE — ED Notes (Signed)
Pt presents with c/o chest pain that started last night. Pt says the pain is under her left breast. Pt says she she is also experiencing dizziness and shortness of breath. Pt checked in at [facility] about 30 minutes ago and said she could not wait that long to see a doctor so she came over here. Hyperventilating on arrival.

## 2012-08-14 NOTE — ED Notes (Signed)
[id number]<AV> Expected date:<BR> Expected time:<BR> Means of arrival:<BR> Comments:<BR> Pt in room.

## 2012-08-14 NOTE — ED Notes (Signed)
Patient transported to X-ray 

## 2012-08-14 NOTE — ED Notes (Signed)
Name called no answer x 1 

## 2012-08-14 NOTE — ED Provider Notes (Signed)
History    CSN: [id number] Arrival date & time 08/14/12  1950  First MD Initiated Contact with Patient 08/14/12 2004     Chief Complaint  Patient presents with  . Chest Pain   (Consider location/radiation/quality/duration/timing/severity/associated sxs/prior Treatment) Patient is a 27 y.o. female presenting with chest pain. The history is provided by the patient.  Chest Pain  patient here with constant sharp chest pain under her left breast which began last night. She also notes that she is dyspneic has been dizzy. Denies any associated diaphoresis or syncope or near-syncope. Went to [place] prior to arrival here but became frustrated when he went time and presents here. Denies any fever or chills. Denies any worsening swelling. No prior history of same. No treatment used prior to arrival. Denies any recent travel history. She did note some audible wheezing Past Medical History  Diagnosis Date  . Cholecystitis 07/16/10  . Hypertension    Past Surgical History  Procedure Laterality Date  . Cholecystectomy     No family history on file. History  Substance Use Topics  . Smoking status: Former Smoker    Types: Cigars    Start date: 03/11/2012  . Smokeless tobacco: Never Used  . Alcohol Use: No   OB History   Grav Para Term Preterm Abortions TAB SAB Ect Mult Living   2 2             Review of Systems  Cardiovascular: Positive for chest pain.  All other systems reviewed and are negative.    Allergies  Stadol  Home Medications   Current Outpatient Rx  Name  Route  Sig  Dispense  Refill  . acetaminophen (TYLENOL) 500 MG tablet   Oral   Take 1,000 mg by mouth every 6 (six) hours as needed. Stomach pains         . amLODipine (NORVASC) 10 MG tablet   Oral   Take 1 tablet (10 mg total) by mouth daily.   30 tablet   11   . hydrochlorothiazide (HYDRODIURIL) 25 MG tablet   Oral   Take 25 mg by mouth daily.         Marland Kitchen ibuprofen (ADVIL,MOTRIN) 800 MG tablet  Oral   Take 1 tablet (800 mg total) by mouth every 8 (eight) hours as needed for pain.   30 tablet   0   . metroNIDAZOLE (FLAGYL) 500 MG tablet   Oral   Take 1 tablet (500 mg total) by mouth 2 (two) times daily.   14 tablet   0   . norgestimate-ethinyl estradiol (ORTHO-CYCLEN,SPRINTEC,PREVIFEM) 0.25-35 MG-MCG tablet   Oral   Take 1 tablet by mouth daily.   1 Package   11   . promethazine (PHENERGAN) 12.5 MG tablet   Oral   Take 1 tablet (12.5 mg total) by mouth every 6 (six) hours as needed for nausea.   30 tablet   0   . EXPIRED: promethazine (PHENERGAN) 25 MG tablet   Oral   Take 1 tablet (25 mg total) by mouth every 6 (six) hours as needed for nausea.   60 tablet   0    BP 166/86  Pulse 71  Temp(Src) 99.2 F (37.3 C) (Oral)  Resp 19  SpO2 100%  LMP 08/07/2012 Physical Exam  Nursing note and vitals reviewed. Constitutional: She is oriented to person, place, and time. She appears well-developed and well-nourished.  Non-toxic appearance. No distress.  HENT:  Head: Normocephalic and atraumatic.  Eyes: Conjunctivae,  EOM and lids are normal. Pupils are equal, round, and reactive to light.  Neck: Normal range of motion. Neck supple. No tracheal deviation present. No mass present.  Cardiovascular: Normal rate, regular rhythm and normal heart sounds.  Exam reveals no gallop.   No murmur heard. Pulmonary/Chest: Effort normal. No stridor. No respiratory distress. She has decreased breath sounds. She has wheezes. She has no rhonchi. She has no rales.    Abdominal: Soft. Normal appearance and bowel sounds are normal. She exhibits no distension. There is no tenderness. There is no rebound and no CVA tenderness.  Musculoskeletal: Normal range of motion. She exhibits no edema and no tenderness.  Neurological: She is alert and oriented to person, place, and time. She has normal strength. No cranial nerve deficit or sensory deficit. GCS eye subscore is 4. GCS verbal subscore is 5.  GCS motor subscore is 6.  Skin: Skin is warm and dry. No abrasion and no rash noted.  Psychiatric: Her speech is normal and behavior is normal. Her mood appears anxious.    ED Course  Procedures (including critical care time) Labs Reviewed  CBC  BASIC METABOLIC PANEL  D-DIMER, QUANTITATIVE   No results found. No diagnosis found.  MDM   Date: 08/14/2012  Rate: 67  Rhythm: normal sinus rhythm  QRS Axis: normal  Intervals: normal  ST/T Wave abnormalities: normal  Conduction Disutrbances:none  Narrative Interpretation: LVH  Old EKG Reviewed: unchanged  Patient given medications here for suspected chest wall pain/pleurisy and patient feels better. Her EKG is nonacute. D-dimer is negative. Troponins negative as well 2. She feels better at this time and is stable for discharge. Doubt that patient has ACS   Toy Baker, MD 08/14/12 2157

## 2012-08-16 ENCOUNTER — Ambulatory Visit (INDEPENDENT_AMBULATORY_CARE_PROVIDER_SITE_OTHER): Payer: Self-pay | Admitting: Family Medicine

## 2012-08-16 ENCOUNTER — Encounter: Payer: Self-pay | Admitting: Family Medicine

## 2012-08-16 VITALS — BP 165/100 | HR 91 | Temp 98.5°F | Ht 62.0 in | Wt 215.6 lb

## 2012-08-16 DIAGNOSIS — L0292 Furuncle, unspecified: Secondary | ICD-10-CM

## 2012-08-16 DIAGNOSIS — L02224 Furuncle of groin: Secondary | ICD-10-CM

## 2012-08-16 MED ORDER — SULFAMETHOXAZOLE-TRIMETHOPRIM 800-160 MG PO TABS: 2.0000 | ORAL_TABLET | Freq: Two times a day (BID) | ORAL | Status: DC

## 2012-08-16 MED ORDER — KETOROLAC TROMETHAMINE 30 MG/ML IJ SOLN
30.0000 mg | Freq: Once | INTRAMUSCULAR | Status: AC
  Administered 2012-08-16: 30 mg via INTRAMUSCULAR

## 2012-08-16 NOTE — Patient Instructions (Addendum)
It was a pleasure to see you today.  We drained the boil on the inner right thigh.    I sent a prescription for antibiotic to your pharmacy.  Take the Bactrim DS  2 tablets by mouth TWICE daily, for 7 days.   Please remove the small piece of gauze from the wound in 36 to 48 hours.  Keep covered and dry. For follow up in the coming 1 week.

## 2012-08-16 NOTE — Progress Notes (Signed)
  Subjective:    Patient ID: Catherine Simmons, female    DOB: 1985-03-06, 27 y.o.   MRN: [id number]  HPI Patient with SDA for worsening painful boil that has developed along R medial thigh near groin.  Has grown in the past few days, too painful to wear underwear (rubs against lesion and causes pain). No objective fevers.  Has been using warm compresses that make it hurt worse.  Will be driven home (is not driving today). Review of Systems     Objective:   Physical Exam Generally well appearing, no apparent distress SKIN: 5cm diameter erythematous tense boil along the R inner thigh.  Shotty inguinal adenopathy on R.       Assessment & Plan:  Procedure Note; Explained risks and benefits of I&D boil, as well as r/b of no intervention.  Patient gives written and verbal consent after given opportunity to ask questions. Area prepped in sterile fashion.  2% lido with epinephrine infiltrated into lesion. 11-blade scalpel used to make a 1cm incision across middle of abscess. Copious amount of purulent fluid expressed from wound.  Irrigated with 15cc sterile water. Packed with 5cm of 1/2 inch wide sterile gauze packing.  Well tolerated, adequate hemostasis.

## 2012-08-16 NOTE — Assessment & Plan Note (Signed)
I&D performed today.  Given size (just at 5cm diameter), will initiate oral abx (studies on treatment of SSTI with uncomplicated abscesses limited I&D-only therapy to boils up to 5cm diameter).  Bactrim DS 2 tabs po bid FOR 7 DAYS.  For follow up in the coming week, instructed to remove the gauze packing in 36 to 48 hours, keep covered.

## 2012-09-20 ENCOUNTER — Other Ambulatory Visit: Payer: Self-pay | Admitting: Family Medicine

## 2012-10-05 ENCOUNTER — Telehealth: Payer: Self-pay | Admitting: Family Medicine

## 2012-10-05 NOTE — Telephone Encounter (Signed)
Pt dropped off form to be filled out regarding [facility] Plasma donations.

## 2012-10-05 NOTE — Telephone Encounter (Signed)
Placed in Dr. Felipa Emory box Wyatt Haste, RN-BSN

## 2012-10-06 ENCOUNTER — Encounter: Payer: Self-pay | Admitting: Family Medicine

## 2012-10-06 ENCOUNTER — Ambulatory Visit (INDEPENDENT_AMBULATORY_CARE_PROVIDER_SITE_OTHER): Payer: Self-pay | Admitting: Family Medicine

## 2012-10-06 ENCOUNTER — Other Ambulatory Visit (HOSPITAL_COMMUNITY)
Admission: RE | Admit: 2012-10-06 | Discharge: 2012-10-06 | Disposition: A | Discharge status: Home / Self Care | Payer: Self-pay | Source: Ambulatory Visit | Attending: Family Medicine | Admitting: Family Medicine

## 2012-10-06 VITALS — BP 140/98 | HR 74 | Temp 98.8°F | Ht 62.0 in | Wt 216.0 lb

## 2012-10-06 DIAGNOSIS — R1031 Right lower quadrant pain: Secondary | ICD-10-CM

## 2012-10-06 DIAGNOSIS — N949 Unspecified condition associated with female genital organs and menstrual cycle: Secondary | ICD-10-CM

## 2012-10-06 DIAGNOSIS — Z113 Encounter for screening for infections with a predominantly sexual mode of transmission: Secondary | ICD-10-CM | POA: Insufficient documentation

## 2012-10-06 DIAGNOSIS — Z9189 Other specified personal risk factors, not elsewhere classified: Secondary | ICD-10-CM

## 2012-10-06 DIAGNOSIS — Z202 Contact with and (suspected) exposure to infections with a predominantly sexual mode of transmission: Secondary | ICD-10-CM

## 2012-10-06 DIAGNOSIS — G8929 Other chronic pain: Secondary | ICD-10-CM

## 2012-10-06 DIAGNOSIS — N938 Other specified abnormal uterine and vaginal bleeding: Secondary | ICD-10-CM

## 2012-10-06 DIAGNOSIS — R1032 Left lower quadrant pain: Secondary | ICD-10-CM

## 2012-10-06 LAB — POCT WET PREP (WET MOUNT)

## 2012-10-06 LAB — CBC
Hemoglobin: 10.5 g/dL — ABNORMAL LOW (ref 12.0–15.0)
MCV: 77.3 fL — ABNORMAL LOW (ref 78.0–100.0)
Platelets: 235 10*3/uL (ref 150–400)
RBC: 4.18 MIL/uL (ref 3.87–5.11)
WBC: 5.3 10*3/uL (ref 4.0–10.5)

## 2012-10-06 MED ORDER — NORGESTIMATE-ETH ESTRADIOL 0.25-35 MG-MCG PO TABS: 1.0000 | ORAL_TABLET | Freq: Every day | ORAL | Status: DC

## 2012-10-06 NOTE — Patient Instructions (Addendum)
It was good to see you today.  I have started you on a birth control pill to help regulate your periods.  I will call you with your results if anything is abnormal. We are also checking your blood count and your electrolytes for your fatigue.  Please follow up with Dr. Ermalinda Memos in one month, or sooner if needed.  Zyshonne Malecha M. Antero Derosia, M.D.

## 2012-10-06 NOTE — Progress Notes (Signed)
Patient ID: Catherine Simmons, female   DOB: 1985/06/23, 27 y.o.   MRN: [id number]  [facility] Paysen Goza M. Timotheus Salm, MD Phone: [phone]   Subjective: HPI: Patient is a 27 y.o. female presenting to clinic today for abnormal menstrual periods.  1. Spotting - Had abnormal periods since beginning of July. Not using anything for birth control. Last normal cycle was in June (4-5 days with heavy flow.) She had I&D in July and started antibiotics. Since then she has 2 cycles of abnormal bleeding about 2 weeks ago. Last time was this past Saturday (2 days of heavy bleeding.) She had history of abnormal periods as a teenager, then again post-partum. She has been regulated in the past with OCP. She states current bleeding is painful, which is new. Passed nickel sized clots. She is feeling more fatigued than usual and her mouth is dry. She would like to be checked for diabetes.  2. H/o chlamydia - Diagnosed in April. Would like recheck. Does have clear discharge. Some odor. No dyspareunia, no dysuria. Unsure if her partner has had other partners.   History Reviewed: Former smoker.  ROS: Please see HPI above.  Objective: Office vital signs reviewed. BP 140/98  Pulse 74  Temp(Src) 98.8 F (37.1 C) (Oral)  Ht 5\' 2"  (1.575 m)  Wt 216 lb (97.977 kg)  BMI 39.5 kg/m2  LMP 10/01/2012  Physical Examination:  General: Awake, alert. NAD HEENT: Atraumatic, normocephalic. MMM. Hyperpigmentation of hard/soft palate Pulm: CTAB, no wheezes Cardio: RRR, no murmurs appreciated Abdomen:+BS, soft, nondistended. TTP suprapubic GU: Normal external genitalia. Scant white vaginal discharge with mild odor. No CMT Extremities: No edema Neuro: Grossly intact  Assessment: 27 y.o. female with spotting, abd pain, fatigue and possible exposure to STD  Plan: See Problem List and After Visit Summary

## 2012-10-06 NOTE — Telephone Encounter (Signed)
Filled out form and returned to RN. No apparent contraindications.   Murtis Sink, MD [facility] Resident, PGY-2 10/06/2012, 1:35 PM

## 2012-10-07 DIAGNOSIS — N949 Unspecified condition associated with female genital organs and menstrual cycle: Secondary | ICD-10-CM | POA: Insufficient documentation

## 2012-10-07 DIAGNOSIS — Z202 Contact with and (suspected) exposure to infections with a predominantly sexual mode of transmission: Secondary | ICD-10-CM | POA: Insufficient documentation

## 2012-10-07 LAB — BASIC METABOLIC PANEL
CO2: 25 mEq/L (ref 19–32)
Chloride: 108 mEq/L (ref 96–112)
Creat: 0.65 mg/dL (ref 0.50–1.10)
Glucose, Bld: 85 mg/dL (ref 70–99)

## 2012-10-07 NOTE — Assessment & Plan Note (Signed)
Will start on OCP to try to regulate bleeding. She had some TTP on exam. May benefit from abd ultrasound if pain and bleeding continues.

## 2012-10-07 NOTE — Assessment & Plan Note (Signed)
Will check wet prep, GC/Ch, HIV and RPR.

## 2012-10-10 LAB — HIV 1/2 CONFIRMATION
HIV-1 antibody: POSITIVE — AB
HIV-2 Ab: NEGATIVE

## 2012-10-11 ENCOUNTER — Other Ambulatory Visit: Payer: Self-pay | Admitting: Family Medicine

## 2012-10-11 MED ORDER — METRONIDAZOLE 500 MG PO TABS: 500.0000 mg | ORAL_TABLET | Freq: Two times a day (BID) | ORAL | Status: AC

## 2012-10-11 NOTE — Progress Notes (Signed)
Flagyl sent to pharmacy for BV.  Also, discussed HIV test with patient. She was tearful, but will come to clinic tomorrow at 2:30pm to further discuss results.  Natashia Roseman M. Markez Dowland, M.D.

## 2012-10-12 ENCOUNTER — Encounter: Payer: Self-pay | Admitting: Family Medicine

## 2012-10-12 ENCOUNTER — Ambulatory Visit (INDEPENDENT_AMBULATORY_CARE_PROVIDER_SITE_OTHER): Payer: Self-pay | Admitting: Family Medicine

## 2012-10-12 VITALS — BP 155/101 | HR 64 | Temp 98.2°F

## 2012-10-12 DIAGNOSIS — B20 Human immunodeficiency virus [HIV] disease: Secondary | ICD-10-CM | POA: Insufficient documentation

## 2012-10-12 NOTE — Patient Instructions (Addendum)
Please call us if you have any questions or need to talk. We will make an appointment for the ID clinic. Your labs today will be more for our information about how your immune system is doing, but you can call to ask the results, if you would like to know.  Please follow up with your regular doctor within the next month.   Amber M. Hairford, M.D.

## 2012-10-12 NOTE — Progress Notes (Signed)
Patient ID: [name], female   DOB: 1985/03/15, 27 y.o.   MRN: [id number]  [facility] Latrisa Hellums M. Tondra Reierson, MD Phone: [phone]   Subjective: HPI: Patient is a 27 y.o. female presenting to clinic today for same day appointment for follow up on HIV results.  Patient tested positive for HIV last week; made aware of these results yesterday. She states she does not know how she is going to deal with all of this. She states she has had 4 partners since her last negative result. 2 of these partners are incarcerated, one she has not been in contact with (but was the initial reason we checked the lab test because she thought he was cheating) and she now has a new partner of 2 weeks. She states she has heard rumors that her new partner may also be MSM.   She is having difficulty adjusting to the news, as expected, but I have laid out the plan for her and hopefully she will return as needed for help. At this time no SI/HI although she is afraid someone will "kill her" if they find out about her status.  History Reviewed: Former smoker.  ROS: Please see HPI above.  Objective: Office vital signs reviewed. BP 155/101  Pulse 64  Temp(Src) 98.2 F (36.8 C) (Oral)  LMP 10/01/2012  Physical Examination:  General: Awake, alert. Crying uncontrollably. Pulm: CTAB, no wheezes Cardio: RRR, no murmurs appreciated Neuro: Grossly intact Psych: Makes good eye contact, able to smile with son. Crying.  Assessment: 27 y.o. female follow up for HIV results.  Plan: See Problem List and After Visit Summary

## 2012-10-12 NOTE — Assessment & Plan Note (Signed)
New diagnosis. Will refer to ID. I have completed the report form for the health dept and faxed back. Will collect RNA quant, CD4 and genotype today. F/u with PCP as needed.

## 2012-10-13 LAB — T-HELPER CELLS (CD4) COUNT (NOT AT ARMC)
Total Lymphocyte: 49 % — ABNORMAL HIGH (ref 12–46)
Total lymphocyte count: 2744 /uL (ref 700–3300)
WBC, lymph enumeration: 5.6 10*3/uL (ref 4.0–10.5)

## 2012-10-14 LAB — HIV-1 RNA ULTRAQUANT REFLEX TO GENTYP+
HIV 1 RNA Quant: 620 copies/mL — ABNORMAL HIGH (ref <20)
HIV-1 RNA Quant, Log: 2.79 {Log} — ABNORMAL HIGH (ref <1.30)

## 2012-10-17 ENCOUNTER — Telehealth: Payer: Self-pay

## 2012-10-17 NOTE — Telephone Encounter (Signed)
Spoke with patient regarding new intake for HIV.  Referral from [facility].  She has agreed to come for visit on 10-20-12 @ 9 am.   Laurell Josephs ,RN

## 2012-10-20 ENCOUNTER — Ambulatory Visit (INDEPENDENT_AMBULATORY_CARE_PROVIDER_SITE_OTHER): Payer: Self-pay

## 2012-10-20 DIAGNOSIS — Z79899 Other long term (current) drug therapy: Secondary | ICD-10-CM

## 2012-10-20 DIAGNOSIS — B20 Human immunodeficiency virus [HIV] disease: Secondary | ICD-10-CM

## 2012-10-20 DIAGNOSIS — Z23 Encounter for immunization: Secondary | ICD-10-CM

## 2012-10-20 LAB — CBC WITH DIFFERENTIAL/PLATELET
Basophils Absolute: 0 10*3/uL (ref 0.0–0.1)
Eosinophils Absolute: 0.1 10*3/uL (ref 0.0–0.7)
Eosinophils Relative: 2 % (ref 0–5)
HCT: 33.4 % — ABNORMAL LOW (ref 36.0–46.0)
MCH: 25.1 pg — ABNORMAL LOW (ref 26.0–34.0)
MCV: 77.7 fL — ABNORMAL LOW (ref 78.0–100.0)
Monocytes Absolute: 0.4 10*3/uL (ref 0.1–1.0)
Platelets: 223 10*3/uL (ref 150–400)
RDW: 15.6 % — ABNORMAL HIGH (ref 11.5–15.5)

## 2012-10-20 LAB — LIPID PANEL
HDL: 50 mg/dL (ref >39)
LDL Cholesterol: 83 mg/dL (ref 0–99)
Total CHOL/HDL Ratio: 2.9 Ratio
Triglycerides: 61 mg/dL (ref <150)
VLDL: 12 mg/dL (ref 0–40)

## 2012-10-20 LAB — HEPATITIS B SURFACE ANTIBODY,QUALITATIVE: Hep B S Ab: POSITIVE — AB

## 2012-10-20 LAB — COMPLETE METABOLIC PANEL WITH GFR
ALT: <8 U/L (ref 0–35)
AST: 10 U/L (ref 0–37)
BUN: 10 mg/dL (ref 6–23)
CO2: 26 mEq/L (ref 19–32)
Calcium: 9.2 mg/dL (ref 8.4–10.5)
Creat: 0.64 mg/dL (ref 0.50–1.10)
GFR, Est African American: >89 mL/min
Total Bilirubin: 0.4 mg/dL (ref 0.3–1.2)

## 2012-10-20 LAB — HEPATITIS B SURFACE ANTIGEN: Hepatitis B Surface Ag: NEGATIVE

## 2012-10-20 MED ORDER — PNEUMOCOCCAL VAC POLYVALENT 25 MCG/0.5ML IJ INJ: 0.5000 mL | INJECTION | Freq: Once | INTRAMUSCULAR | Status: DC

## 2012-10-21 LAB — T-HELPER CELL (CD4) - (RCID CLINIC ONLY): CD4 T Cell Abs: 640 /uL (ref 400–2700)

## 2012-10-21 LAB — URINALYSIS
Bilirubin Urine: NEGATIVE
Glucose, UA: NEGATIVE mg/dL
Hgb urine dipstick: NEGATIVE
Ketones, ur: NEGATIVE mg/dL
Protein, ur: NEGATIVE mg/dL

## 2012-10-21 LAB — HEPATITIS B CORE ANTIBODY, TOTAL: Hep B Core Total Ab: NEGATIVE

## 2012-10-28 NOTE — Progress Notes (Signed)
Patient is very anxious and depressed regarding new HIV diagnosis. She is tearful throughout intake.  She really did not expect the test to be positive.  She is also upset about not HIV testing at last primary care visit for STD testing prior to August, 2014.   During the visit she asked for all STD testing and thought a HIV testing was done since she asked for "all STD testing".  She was previously in a relationship for 1 1/2 years in which  partner has tested and the results were negative. Within the last two - three months there have been two new female partners and she is determined to find out which one may have been the source of infection.    Last HIV testing in our system is 01-01-10 but she states she was tested in 2012 with pregnancy of last child.    Copies of HIV test and labs given to patient today per her request.  Long discussion with patient regarding risk factors along with HIV transmission possibilities, stages of HIV along with education and prevention.  Vaccines given.    Laurell Josephs, RN

## 2012-11-01 ENCOUNTER — Ambulatory Visit (INDEPENDENT_AMBULATORY_CARE_PROVIDER_SITE_OTHER): Payer: Medicaid Other | Admitting: *Deleted

## 2012-11-01 ENCOUNTER — Encounter: Payer: Self-pay | Admitting: Internal Medicine

## 2012-11-01 ENCOUNTER — Ambulatory Visit (INDEPENDENT_AMBULATORY_CARE_PROVIDER_SITE_OTHER): Payer: Self-pay | Admitting: Internal Medicine

## 2012-11-01 VITALS — BP 142/91 | HR 60 | Temp 97.4°F | Resp 18 | Ht 62.0 in | Wt 215.0 lb

## 2012-11-01 VITALS — BP 142/91 | HR 60 | Temp 97.4°F | Wt 215.0 lb

## 2012-11-01 DIAGNOSIS — Z23 Encounter for immunization: Secondary | ICD-10-CM

## 2012-11-01 DIAGNOSIS — Z21 Asymptomatic human immunodeficiency virus [HIV] infection status: Secondary | ICD-10-CM

## 2012-11-01 DIAGNOSIS — B20 Human immunodeficiency virus [HIV] disease: Secondary | ICD-10-CM

## 2012-11-01 NOTE — Progress Notes (Signed)
[facility] CLINIC NOTE  RFV: newly diagnosed, engaging care Subjective:    Patient ID: Catherine Simmons, female    DOB: [birth year], 27 y.o.   MRN: [id number]  HPI 27yo F with newly diagnosed HIV, CD 4 count 1015/VL 620, unable to get genotype, previously tested negative in 2012 in setting of pregnancy. She was recently diagnosed with hiv in setting of being asymptomatic. She has numerous questions about relationship with serodiscordant couples and having further babies.  Soc hx: has 8 yr daughter, Jones Bales, and 2 yr old son, marquis. Currently in a relationship, just started dating 2 months ago. He is getting tested. She has disclosed to him.   Current Outpatient Prescriptions on File Prior to Visit  Medication Sig Dispense Refill  . acetaminophen (TYLENOL) 500 MG tablet Take 1,000 mg by mouth every 6 (six) hours as needed. Stomach pains      . amLODipine (NORVASC) 10 MG tablet Take 1 tablet (10 mg total) by mouth daily.  30 tablet  11  . ibuprofen (ADVIL,MOTRIN) 800 MG tablet TAKE ONE TABLET BY MOUTH EVERY 8 HOURS AS NEEDED FOR PAIN  30 tablet  0  . methocarbamol (ROBAXIN-750) 750 MG tablet Take 1 tablet (750 mg total) by mouth 4 (four) times daily.  30 tablet  0  . norgestimate-ethinyl estradiol (ORTHO-CYCLEN,SPRINTEC,PREVIFEM) 0.25-35 MG-MCG tablet Take 1 tablet by mouth daily.  3 Package  prn  . oxyCODONE-acetaminophen (PERCOCET/ROXICET) 5-325 MG per tablet Take 1 tablet by mouth every 4 (four) hours as needed for pain.  15 tablet  0   No current facility-administered medications on file prior to visit.   Active Ambulatory Problems    Diagnosis Date Noted  . GONOCOCCAL CERVICITIS 05/09/2008  . OBESITY 05/09/2008  . SMOKER 05/09/2008  . DEPRESSION 05/09/2008  . CARPAL TUNNEL SYNDROME 11/14/2009  . UNSPECIFIED DISORDER TEETH&SUPPORTING STRUCTURES 08/13/2009  . PYELONEPHRITIS 07/02/2006  . UNSPECIFIED VAGINITIS AND VULVOVAGINITIS 01/01/2010  . Leukorrhea, not specified as infective  09/10/2009  . AMENORRHEA 06/25/2009  . EXCESSIVE OR FREQUENT MENSTRUATION 03/29/2007  . NUMMULAR ECZEMA 11/14/2009  . BROKEN TOOTH, INFECTED 08/22/2009  . GROUP B STREPTOCOCCUS CARRIER 02/06/2010  . Hypertension 04/10/2010  . Postpartum exam 04/10/2010  . Abdominal  pain, other specified site 05/13/2010  . Chest pain 05/14/2010  . PID (acute pelvic inflammatory disease) 10/17/2010  . UTI (lower urinary tract infection) 10/17/2010  . Atypical chest pain 03/18/2012  . Abdominal pain 03/18/2012  . Pelvic pain 06/03/2012  . Boil, groin 08/16/2012  . Other disorder of menstruation and other abnormal bleeding from female genital tract 10/07/2012  . Possible exposure to STD 10/07/2012  . Human immunodeficiency virus (HIV) disease 10/12/2012   Resolved Ambulatory Problems    Diagnosis Date Noted  . Dysuria 09/10/2009  . Cholelithiasis 05/28/2010   Past Medical History  Diagnosis Date  . Cholecystitis 07/16/10   History  Substance Use Topics  . Smoking status: Former Smoker    Types: Cigarettes, Cigars    Start date: 03/11/2012    Quit date: 08/24/2012  . Smokeless tobacco: Never Used  . Alcohol Use: 3.6 oz/week    6 Cans of beer per week   family history includes Cancer in her mother; Diabetes in her maternal aunt.  Review of Systems Review of Systems  Constitutional: Negative for fever, chills, diaphoresis, activity change, appetite change, fatigue and unexpected weight change.  HENT: Negative for congestion, sore throat, rhinorrhea, sneezing, trouble swallowing and sinus pressure.  Eyes: Negative for photophobia and visual disturbance.  Respiratory: Negative for cough, chest tightness, shortness of breath, wheezing and stridor.  Cardiovascular: Negative for chest pain, palpitations and leg swelling.  Gastrointestinal: Negative for nausea, vomiting, abdominal pain, diarrhea, constipation, blood in stool, abdominal distention and anal bleeding.  Genitourinary: Negative for  dysuria, hematuria, flank pain and difficulty urinating.  Musculoskeletal: Negative for myalgias, back pain, joint swelling, arthralgias and gait problem.  Skin: Negative for color change, pallor, rash and wound.  Neurological: Negative for dizziness, tremors, weakness and light-headedness.  Hematological: Negative for adenopathy. Does not bruise/bleed easily.  Psychiatric/Behavioral: Negative for behavioral problems, confusion, sleep disturbance, dysphoric mood, decreased concentration and agitation.       Objective:   Physical Exam BP 142/91  Pulse 60  Temp(Src) 97.4 F (36.3 C) (Oral)  Wt 215 lb (97.523 kg)  BMI 39.31 kg/m2  LMP 10/01/2012 Physical Exam  Constitutional: oriented to person, place, and time.  appears well-developed and well-nourished. No distress.  HENT: tongue pierced Mouth/Throat: Oropharynx is clear and moist. No oropharyngeal exudate.  Cardiovascular: Normal rate, regular rhythm and normal heart sounds. Exam reveals no gallop and no friction rub.  No murmur heard.  Pulmonary/Chest: Effort normal and breath sounds normal. No respiratory distress.  has no wheezes.  Abdominal: Soft. Bowel sounds are normal.  exhibits no distension. There is no tenderness.  Lymphadenopathy:  no cervical adenopathy.  Neurological:  alert and oriented to person, place, and time.  Skin: Skin is warm and dry. No rash noted. No erythema.  Psychiatric:  a normal mood and affect.  behavior is normal.         Assessment & Plan:  27yo F with newly diagnosed HIV, ART naive.  Discussed HIV facts, transmission, treatment and adherence. She met Tania Ade and interested in participating in ARIA study.

## 2012-11-14 ENCOUNTER — Encounter: Payer: Self-pay | Admitting: Internal Medicine

## 2012-11-14 LAB — CD4/CD8 (T-HELPER/T-SUPPRESSOR CELL)
CD4%: 36
CD4: 846

## 2012-11-14 LAB — HLA B*5701: HLA-B*5701: NEGATIVE

## 2012-11-15 NOTE — Progress Notes (Signed)
Pt here to screen for ARIA study. Discussed informed consent with her and answered any questions/concerns. She verbalized understanding and signed the consent witnessed by me. Medical hx, medications, demographics were reviewed and confirmed by patient. Vitals were performed in clinic with Dr. Drue Second. Labs were drawn. Told her I would call her once labs results were final and we could determine if she is eligible. She received a gift card via [employer] for her visit. Tacey Heap RN

## 2012-11-18 ENCOUNTER — Encounter: Payer: Self-pay | Admitting: Internal Medicine

## 2012-11-18 LAB — CD4/CD8 (T-HELPER/T-SUPPRESSOR CELL): CD4: 846

## 2012-11-18 LAB — HIV-1 RNA QUANT-NO REFLEX-BLD: HIV-1 RNA Viral Load: 2293

## 2012-11-22 ENCOUNTER — Ambulatory Visit (INDEPENDENT_AMBULATORY_CARE_PROVIDER_SITE_OTHER): Payer: Self-pay | Admitting: Internal Medicine

## 2012-11-22 ENCOUNTER — Encounter: Payer: Self-pay | Admitting: Internal Medicine

## 2012-11-22 VITALS — BP 147/97 | HR 64 | Temp 98.6°F | Wt 214.0 lb

## 2012-11-22 DIAGNOSIS — N926 Irregular menstruation, unspecified: Secondary | ICD-10-CM

## 2012-11-22 DIAGNOSIS — B20 Human immunodeficiency virus [HIV] disease: Secondary | ICD-10-CM

## 2012-11-22 MED ORDER — ABACAVIR-DOLUTEGRAVIR-LAMIVUD 600-50-300 MG PO TABS: 1.0000 | ORAL_TABLET | Freq: Every day | ORAL | Status: DC
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
## 2012-12-01 ENCOUNTER — Encounter: Payer: Self-pay | Admitting: *Deleted

## 2012-12-07 ENCOUNTER — Other Ambulatory Visit: Payer: Self-pay | Admitting: *Deleted

## 2012-12-07 DIAGNOSIS — B20 Human immunodeficiency virus [HIV] disease: Secondary | ICD-10-CM

## 2012-12-07 MED ORDER — ABACAVIR-DOLUTEGRAVIR-LAMIVUD 600-50-300 MG PO TABS: 1.0000 | ORAL_TABLET | Freq: Every day | ORAL | Status: DC

## 2012-12-15 ENCOUNTER — Encounter: Payer: Self-pay | Admitting: Infectious Disease

## 2012-12-30 ENCOUNTER — Ambulatory Visit: Payer: Self-pay

## 2013-01-12 ENCOUNTER — Other Ambulatory Visit (INDEPENDENT_AMBULATORY_CARE_PROVIDER_SITE_OTHER): Payer: Self-pay

## 2013-01-12 DIAGNOSIS — B20 Human immunodeficiency virus [HIV] disease: Secondary | ICD-10-CM

## 2013-01-12 LAB — CBC WITH DIFFERENTIAL/PLATELET
Basophils Relative: 0 % (ref 0–1)
Eosinophils Relative: 2 % (ref 0–5)
HCT: 34.3 % — ABNORMAL LOW (ref 36.0–46.0)
Hemoglobin: 11.2 g/dL — ABNORMAL LOW (ref 12.0–15.0)
Lymphocytes Relative: 45 % (ref 12–46)
Lymphs Abs: 2.2 10*3/uL (ref 0.7–4.0)
MCH: 25.8 pg — ABNORMAL LOW (ref 26.0–34.0)
MCHC: 32.7 g/dL (ref 30.0–36.0)
MCV: 79 fL (ref 78.0–100.0)
Monocytes Absolute: 0.5 10*3/uL (ref 0.1–1.0)
Monocytes Relative: 10 % (ref 3–12)
Neutro Abs: 2.1 10*3/uL (ref 1.7–7.7)
Neutrophils Relative %: 43 % (ref 43–77)
RBC: 4.34 MIL/uL (ref 3.87–5.11)
RDW: 14.7 % (ref 11.5–15.5)
WBC: 4.9 10*3/uL (ref 4.0–10.5)

## 2013-01-12 LAB — COMPLETE METABOLIC PANEL WITH GFR
ALT: 8 U/L (ref 0–35)
AST: 10 U/L (ref 0–37)
Albumin: 3.8 g/dL (ref 3.5–5.2)
BUN: 9 mg/dL (ref 6–23)
CO2: 26 mEq/L (ref 19–32)
Calcium: 9.3 mg/dL (ref 8.4–10.5)
Chloride: 104 mEq/L (ref 96–112)
Creat: 0.65 mg/dL (ref 0.50–1.10)
GFR, Est African American: >89 mL/min
Glucose, Bld: 86 mg/dL (ref 70–99)
Potassium: 4 mEq/L (ref 3.5–5.3)
Sodium: 137 mEq/L (ref 135–145)

## 2013-01-13 LAB — T-HELPER CELL (CD4) - (RCID CLINIC ONLY)
CD4 % Helper T Cell: 34 % (ref 33–55)
CD4 T Cell Abs: 810 /uL (ref 400–2700)

## 2013-01-16 LAB — HIV-1 RNA QUANT-NO REFLEX-BLD: HIV-1 RNA Quant, Log: 3.02 {Log} — ABNORMAL HIGH (ref <1.30)

## 2013-01-25 ENCOUNTER — Ambulatory Visit (INDEPENDENT_AMBULATORY_CARE_PROVIDER_SITE_OTHER): Payer: Self-pay | Admitting: Internal Medicine

## 2013-01-25 ENCOUNTER — Encounter: Payer: Self-pay | Admitting: Internal Medicine

## 2013-01-25 VITALS — BP 126/86 | HR 87 | Temp 98.7°F | Wt 210.0 lb

## 2013-01-25 DIAGNOSIS — B20 Human immunodeficiency virus [HIV] disease: Secondary | ICD-10-CM

## 2013-01-25 MED ORDER — ABACAVIR-DOLUTEGRAVIR-LAMIVUD 600-50-300 MG PO TABS: 1.0000 | ORAL_TABLET | Freq: Every day | ORAL | Status: DC
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
## 2013-01-26 ENCOUNTER — Ambulatory Visit (INDEPENDENT_AMBULATORY_CARE_PROVIDER_SITE_OTHER): Payer: Self-pay | Admitting: Family Medicine

## 2013-01-26 ENCOUNTER — Encounter: Payer: Self-pay | Admitting: Family Medicine

## 2013-01-26 VITALS — BP 153/103 | HR 55 | Temp 97.9°F | Ht 63.0 in | Wt 214.0 lb

## 2013-01-26 DIAGNOSIS — F329 Major depressive disorder, single episode, unspecified: Secondary | ICD-10-CM

## 2013-01-26 DIAGNOSIS — S62609A Fracture of unspecified phalanx of unspecified finger, initial encounter for closed fracture: Secondary | ICD-10-CM | POA: Insufficient documentation

## 2013-01-26 DIAGNOSIS — R52 Pain, unspecified: Secondary | ICD-10-CM

## 2013-01-26 DIAGNOSIS — M79609 Pain in unspecified limb: Secondary | ICD-10-CM

## 2013-01-26 DIAGNOSIS — M79645 Pain in left finger(s): Secondary | ICD-10-CM

## 2013-01-26 DIAGNOSIS — R110 Nausea: Secondary | ICD-10-CM

## 2013-01-26 MED ORDER — PROMETHAZINE HCL 12.5 MG PO TABS: 12.5000 mg | ORAL_TABLET | Freq: Three times a day (TID) | ORAL | Status: DC | PRN

## 2013-01-26 MED ORDER — NAPROXEN 500 MG PO TABS: 500.0000 mg | ORAL_TABLET | Freq: Two times a day (BID) | ORAL | Status: DC
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
## 2013-01-26 NOTE — Patient Instructions (Signed)
Good to see you  Come back in 3 months, sooner if needed.

## 2013-01-26 NOTE — Assessment & Plan Note (Addendum)
PHQ-9 with score of 18 today, 0: Number 9. She likely has baseline depression as this has been a problem in the past, however it currently exacerbated by her adjustment to this new diagnosis. Most symptoms seem to involve feelings of depression, sleep disturbance, problems concentrating. Recommended counseling, also discussed use of SSRIs if her symptoms do not improve in the next several months. Denies SI, HI. Followup 3 months or sooner if needed.
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
## 2013-01-26 NOTE — Assessment & Plan Note (Signed)
Unclear etiology Recommended and scheduled NSAIDs for 5-7 days then when necessary. Ask her to followup if her symptoms don't improve by then.

## 2013-01-26 NOTE — Assessment & Plan Note (Signed)
Possibly medication related, however recommended continuing her antivirals for 2-3 days to see if symptoms improve. I asked her to contact her ID doctor for recommendations. Given prescription for Phenergan. Followup as needed

## 2013-01-27 ENCOUNTER — Ambulatory Visit (HOSPITAL_COMMUNITY)
Admission: RE | Admit: 2013-01-27 | Discharge: 2013-01-27 | Disposition: A | Discharge status: Home / Self Care | Payer: Medicaid Other | Source: Ambulatory Visit | Attending: Family Medicine | Admitting: Family Medicine

## 2013-01-27 ENCOUNTER — Telehealth: Payer: Self-pay | Admitting: Family Medicine

## 2013-01-27 DIAGNOSIS — S62609A Fracture of unspecified phalanx of unspecified finger, initial encounter for closed fracture: Secondary | ICD-10-CM

## 2013-01-27 DIAGNOSIS — X58XXXA Exposure to other specified factors, initial encounter: Secondary | ICD-10-CM | POA: Insufficient documentation

## 2013-01-27 DIAGNOSIS — S62639A Displaced fracture of distal phalanx of unspecified finger, initial encounter for closed fracture: Secondary | ICD-10-CM | POA: Insufficient documentation

## 2013-01-27 DIAGNOSIS — M79645 Pain in left finger(s): Secondary | ICD-10-CM
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
## 2013-01-27 NOTE — Telephone Encounter (Signed)
Pt wants to know where to get her xray done

## 2013-01-27 NOTE — Telephone Encounter (Signed)
Called pt. Can go to [facility]. .Lorenda Hatchet, Renato Battles

## 2013-02-27 ENCOUNTER — Other Ambulatory Visit (INDEPENDENT_AMBULATORY_CARE_PROVIDER_SITE_OTHER): Payer: Self-pay

## 2013-02-27 DIAGNOSIS — B20 Human immunodeficiency virus [HIV] disease: Secondary | ICD-10-CM

## 2013-02-28 LAB — T-HELPER CELL (CD4) - (RCID CLINIC ONLY)
CD4 % Helper T Cell: 36 % (ref 33–55)
CD4 T Cell Abs: 940 /uL (ref 400–2700)

## 2013-03-01 ENCOUNTER — Encounter (HOSPITAL_COMMUNITY): Payer: Self-pay | Admitting: Emergency Medicine

## 2013-03-01 ENCOUNTER — Emergency Department (HOSPITAL_COMMUNITY): Payer: Medicaid Other

## 2013-03-01 ENCOUNTER — Emergency Department (HOSPITAL_COMMUNITY)
Admission: EM | Admit: 2013-03-01 | Discharge: 2013-03-01 | Disposition: A | Discharge status: Home / Self Care | Payer: Medicaid Other | Attending: Emergency Medicine | Admitting: Emergency Medicine

## 2013-03-01 DIAGNOSIS — Z79899 Other long term (current) drug therapy: Secondary | ICD-10-CM | POA: Insufficient documentation

## 2013-03-01 DIAGNOSIS — Z8719 Personal history of other diseases of the digestive system: Secondary | ICD-10-CM | POA: Insufficient documentation

## 2013-03-01 DIAGNOSIS — R110 Nausea: Secondary | ICD-10-CM | POA: Insufficient documentation

## 2013-03-01 DIAGNOSIS — I10 Essential (primary) hypertension: Secondary | ICD-10-CM | POA: Insufficient documentation

## 2013-03-01 DIAGNOSIS — S0990XA Unspecified injury of head, initial encounter: Secondary | ICD-10-CM

## 2013-03-01 DIAGNOSIS — Z3202 Encounter for pregnancy test, result negative: Secondary | ICD-10-CM | POA: Insufficient documentation

## 2013-03-01 DIAGNOSIS — S139XXA Sprain of joints and ligaments of unspecified parts of neck, initial encounter: Secondary | ICD-10-CM | POA: Insufficient documentation

## 2013-03-01 DIAGNOSIS — Z87891 Personal history of nicotine dependence: Secondary | ICD-10-CM | POA: Insufficient documentation

## 2013-03-01 DIAGNOSIS — Y9389 Activity, other specified: Secondary | ICD-10-CM | POA: Insufficient documentation

## 2013-03-01 DIAGNOSIS — S161XXA Strain of muscle, fascia and tendon at neck level, initial encounter: Secondary | ICD-10-CM

## 2013-03-01 DIAGNOSIS — S335XXA Sprain of ligaments of lumbar spine, initial encounter: Secondary | ICD-10-CM | POA: Insufficient documentation

## 2013-03-01 DIAGNOSIS — S060X9A Concussion with loss of consciousness of unspecified duration, initial encounter: Secondary | ICD-10-CM | POA: Insufficient documentation

## 2013-03-01 DIAGNOSIS — Y92410 Unspecified street and highway as the place of occurrence of the external cause: Secondary | ICD-10-CM | POA: Insufficient documentation

## 2013-03-01 DIAGNOSIS — S20219A Contusion of unspecified front wall of thorax, initial encounter: Secondary | ICD-10-CM | POA: Insufficient documentation

## 2013-03-01 DIAGNOSIS — S39012A Strain of muscle, fascia and tendon of lower back, initial encounter: Secondary | ICD-10-CM

## 2013-03-01 LAB — HIV-1 RNA QUANT-NO REFLEX-BLD
HIV 1 RNA Quant: <20 copies/mL (ref <20)
HIV-1 RNA Quant, Log: <1.3 {Log} (ref <1.30)

## 2013-03-01 LAB — POCT PREGNANCY, URINE
Preg Test, Ur: NEGATIVE
Preg Test, Ur: NEGATIVE
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
## 2013-03-01 MED ORDER — DIAZEPAM 5 MG PO TABS
5.0000 mg | ORAL_TABLET | Freq: Once | ORAL | Status: AC
  Administered 2013-03-01: 5 mg via ORAL
  Filled 2013-03-01: qty 1

## 2013-03-01 MED ORDER — NAPROXEN 500 MG PO TABS: 500.0000 mg | ORAL_TABLET | Freq: Two times a day (BID) | ORAL | Status: DC

## 2013-03-01 MED ORDER — HYDROCODONE-ACETAMINOPHEN 5-325 MG PO TABS: 1.0000 | ORAL_TABLET | Freq: Four times a day (QID) | ORAL | Status: DC | PRN

## 2013-03-01 MED ORDER — ONDANSETRON 4 MG PO TBDP
8.0000 mg | ORAL_TABLET | Freq: Once | ORAL | Status: AC
  Administered 2013-03-01: 8 mg via ORAL
  Filled 2013-03-01: qty 2

## 2013-03-01 MED ORDER — KETOROLAC TROMETHAMINE 60 MG/2ML IM SOLN
60.0000 mg | Freq: Once | INTRAMUSCULAR | Status: AC
  Administered 2013-03-01: 60 mg via INTRAMUSCULAR
  Filled 2013-03-01: qty 2

## 2013-03-01 MED ORDER — DIAZEPAM 5 MG PO TABS: 5.0000 mg | ORAL_TABLET | Freq: Three times a day (TID) | ORAL | Status: DC | PRN

## 2013-03-01 MED ORDER — HYDROCODONE-ACETAMINOPHEN 5-325 MG PO TABS
1.0000 | ORAL_TABLET | Freq: Once | ORAL | Status: AC
  Administered 2013-03-01: 1 via ORAL
  Filled 2013-03-01: qty 1
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
## 2013-03-01 NOTE — ED Notes (Signed)
Pt transported to CT ?

## 2013-03-02 NOTE — ED Provider Notes (Signed)
Medical screening examination/treatment/procedure(s) were performed by non-physician practitioner and as supervising physician I was immediately available for consultation/collaboration.  EKG Interpretation   None         Charles B. Sheldon, MD 03/02/13 0902 

## 2013-03-14 ENCOUNTER — Ambulatory Visit (INDEPENDENT_AMBULATORY_CARE_PROVIDER_SITE_OTHER): Payer: Self-pay | Admitting: Internal Medicine

## 2013-03-14 ENCOUNTER — Encounter: Payer: Self-pay | Admitting: Internal Medicine

## 2013-03-14 VITALS — BP 143/86 | HR 76 | Temp 97.4°F | Wt 215.0 lb

## 2013-03-14 DIAGNOSIS — B20 Human immunodeficiency virus [HIV] disease: Secondary | ICD-10-CM

## 2013-03-14 DIAGNOSIS — I10 Essential (primary) hypertension: Secondary | ICD-10-CM

## 2013-03-14 DIAGNOSIS — Z0000 Encounter for general adult medical examination without abnormal findings: Secondary | ICD-10-CM

## 2013-03-14 NOTE — Progress Notes (Signed)
Subjective:    Patient ID: Catherine MccallumMarkita L Simmons, female    DOB: 07/14/1985, 28 y.o.   MRN: [id number]  HPI Catherine Simmons is a a 27yo F with HIV, Cd 4 count of 940/VL<20 (jan 2015) doing well with triomeq. Taking it at bedtime. Missed 2 doses since we last seen her. She thinks she had a the flu this past winter, otherwise, doing well.  In a serodiscordant relationship, partner last tested 2 months ago, negative. They use condoms but no other method of birth control.  Allergies  Allergen Reactions  . Stadol [Butorphanol Tartrate] Itching    Current Outpatient Prescriptions on File Prior to Visit  Medication Sig Dispense Refill  . Abacavir-Dolutegravir-Lamivud 600-50-300 MG TABS Take 1 tablet by mouth daily.  30 tablet  6  . acetaminophen (TYLENOL) 500 MG tablet Take 1,000 mg by mouth every 6 (six) hours as needed. Stomach pains      . amLODipine (NORVASC) 10 MG tablet Take 1 tablet (10 mg total) by mouth daily.  30 tablet  11  . diazepam (VALIUM) 5 MG tablet Take 1 tablet (5 mg total) by mouth every 8 (eight) hours as needed for anxiety.  15 tablet  0  . hydrochlorothiazide (HYDRODIURIL) 25 MG tablet Take 25 mg by mouth daily.      Marland Kitchen. HYDROcodone-acetaminophen (NORCO) 5-325 MG per tablet Take 1 tablet by mouth every 6 (six) hours as needed for moderate pain.  15 tablet  0  . naproxen (NAPROSYN) 500 MG tablet Take 1 tablet (500 mg total) by mouth 2 (two) times daily.  30 tablet  0  . promethazine (PHENERGAN) 12.5 MG tablet Take 1 tablet (12.5 mg total) by mouth every 8 (eight) hours as needed for nausea or vomiting.  20 tablet  0   No current facility-administered medications on file prior to visit.      Review of Systems  Constitutional: Negative for fever, chills, diaphoresis, activity change, appetite change, fatigue and unexpected weight change.  HENT: Negative for congestion, sore throat, rhinorrhea, sneezing, trouble swallowing and sinus pressure.  Eyes: Negative for photophobia and  visual disturbance.  Respiratory: Negative for cough, chest tightness, shortness of breath, wheezing and stridor.  Cardiovascular: Negative for chest pain, palpitations and leg swelling.  Gastrointestinal: Negative for nausea, vomiting, abdominal pain, diarrhea, constipation, blood in stool, abdominal distention and anal bleeding.  Genitourinary: Negative for dysuria, hematuria, flank pain and difficulty urinating.  Musculoskeletal: Negative for myalgias, back pain, joint swelling, arthralgias and gait problem.  Skin: Negative for color change, pallor, rash and wound.  Neurological: Negative for dizziness, tremors, weakness and light-headedness.  Hematological: Negative for adenopathy. Does not bruise/bleed easily.  Psychiatric/Behavioral: Negative for behavioral problems, confusion, sleep disturbance, dysphoric mood, decreased concentration and agitation.       Objective:   Physical Exam BP 143/86  Pulse 76  Temp(Src) 97.4 F (36.3 C) (Oral)  Wt 215 lb (97.523 kg)  LMP 03/13/2013 Physical Exam  Constitutional:  oriented to person, place, and time. appears well-developed and well-nourished. No distress.  HENT:  Mouth/Throat: Oropharynx is clear and moist. No oropharyngeal exudate.  Cardiovascular: Normal rate, regular rhythm and normal heart sounds. Exam reveals no gallop and no friction rub.  No murmur heard.  Pulmonary/Chest: Effort normal and breath sounds normal. No respiratory distress.  no wheezes.  Abdominal: Soft. Bowel sounds are normal. exhibits no distension. There is no tenderness.  Lymphadenopathy:  no cervical adenopathy.  Neurological:  alert and oriented to person, place, and time.  Skin: Skin is warm and dry. No rash noted. No erythema.  Psychiatric:  a normal mood and affect.  behavior is normal.       Assessment & Plan:  HIv = continue taking triomeq  Health maintenance = uptodate on vaccine  htn = continue with amlodipine, and htcz  Birth  control/preventative health = open to starting ocp, referred to health dept to get free ocp. Gave condoms

## 2013-03-30 ENCOUNTER — Other Ambulatory Visit: Payer: Self-pay | Admitting: *Deleted

## 2013-03-30 DIAGNOSIS — B20 Human immunodeficiency virus [HIV] disease: Secondary | ICD-10-CM

## 2013-03-30 MED ORDER — ABACAVIR-DOLUTEGRAVIR-LAMIVUD 600-50-300 MG PO TABS: 1.0000 | ORAL_TABLET | Freq: Every day | ORAL | Status: DC

## 2013-04-03 ENCOUNTER — Emergency Department (HOSPITAL_COMMUNITY)
Admission: EM | Admit: 2013-04-03 | Discharge: 2013-04-04 | Discharge status: Against Medical Advice | Payer: Medicaid Other | Attending: Emergency Medicine | Admitting: Emergency Medicine

## 2013-04-03 ENCOUNTER — Encounter (HOSPITAL_COMMUNITY): Payer: Self-pay | Admitting: Emergency Medicine

## 2013-04-03 DIAGNOSIS — N760 Acute vaginitis: Secondary | ICD-10-CM | POA: Insufficient documentation

## 2013-04-03 DIAGNOSIS — M79609 Pain in unspecified limb: Secondary | ICD-10-CM | POA: Insufficient documentation

## 2013-04-03 HISTORY — DX: Asymptomatic human immunodeficiency virus (hiv) infection status: Z21

## 2013-04-03 HISTORY — DX: Human immunodeficiency virus (HIV) disease: B20

## 2013-04-03 NOTE — ED Notes (Signed)
Pt presents with c/o an abscess in her vaginal area. Pt says it has been there for three days and it is the size of a golf ball. No drainage coming from the abscess at this time. Pt c/o right wrist pain, denies any injury but says she is unable to move it. Pt is tearful in triage.

## 2013-04-24 ENCOUNTER — Encounter: Payer: Self-pay | Admitting: Family Medicine

## 2013-04-24 ENCOUNTER — Ambulatory Visit (INDEPENDENT_AMBULATORY_CARE_PROVIDER_SITE_OTHER): Payer: Self-pay | Admitting: Family Medicine

## 2013-04-24 VITALS — BP 128/80 | HR 74 | Temp 98.0°F | Wt 224.0 lb

## 2013-04-24 DIAGNOSIS — L02219 Cutaneous abscess of trunk, unspecified: Secondary | ICD-10-CM

## 2013-04-24 DIAGNOSIS — L02214 Cutaneous abscess of groin: Secondary | ICD-10-CM

## 2013-04-24 DIAGNOSIS — L03319 Cellulitis of trunk, unspecified: Secondary | ICD-10-CM

## 2013-04-24 MED ORDER — SULFAMETHOXAZOLE-TMP DS 800-160 MG PO TABS: 1.0000 | ORAL_TABLET | Freq: Two times a day (BID) | ORAL | Status: DC

## 2013-04-24 NOTE — Assessment & Plan Note (Signed)
Area in indurated with no fluctuance to drain - Bactrim x 7 days - Warm compresses and sitz baths - Tylenol for pain

## 2013-04-24 NOTE — Patient Instructions (Signed)
It was great seeing you today.   1. Use warm compress 2-3 times a day to help your boil drain. 2. Use Bactrim twice a day for 7 days.  3. Call/return to clinic if you develop fevers > 102 or the area begins to enlarge/spread   If you have any questions or concerns before then, please call the clinic at [phone].  Take Care,   Dr Wenda LowJames Johne Buckle

## 2013-04-24 NOTE — Progress Notes (Signed)
Subjective:     Patient ID: Catherine MccallumMarkita L Simmons, female   DOB: 06/06/1985, 28 y.o.   MRN: [id number]  HPI Comments: She reports a boil that developed on her groin ~ 1 month ago. It had some drainage initially but has since become hard. She hasn't had any recent abx. She reports hx of similar boils in the past, but has notice fewer boils since she stopped shaving. She denies any fevers or chill.     Review of Systems     Objective:   Physical Exam  Vitals reviewed. Constitutional: She appears well-developed and well-nourished.  Skin:  2x3 cm area of tender induration w/o fluctuance or drainage on the right inferior pubic mons; mild erythema w/o warmth   Assessment/Plan:      See Problem Focused Assessment & Plan

## 2013-05-16 ENCOUNTER — Encounter: Payer: Self-pay | Admitting: *Deleted

## 2013-05-30 ENCOUNTER — Encounter: Payer: Self-pay | Admitting: *Deleted

## 2013-05-30 ENCOUNTER — Ambulatory Visit: Payer: Self-pay

## 2013-05-30 ENCOUNTER — Other Ambulatory Visit: Payer: Medicaid Other

## 2013-05-31 ENCOUNTER — Encounter (HOSPITAL_COMMUNITY): Payer: Self-pay | Admitting: Emergency Medicine

## 2013-05-31 ENCOUNTER — Other Ambulatory Visit (INDEPENDENT_AMBULATORY_CARE_PROVIDER_SITE_OTHER): Payer: Self-pay

## 2013-05-31 ENCOUNTER — Emergency Department (HOSPITAL_COMMUNITY)
Admission: EM | Admit: 2013-05-31 | Discharge: 2013-06-01 | Disposition: A | Discharge status: Home / Self Care | Payer: Medicaid Other | Attending: Emergency Medicine | Admitting: Emergency Medicine

## 2013-05-31 ENCOUNTER — Other Ambulatory Visit: Payer: Self-pay

## 2013-05-31 DIAGNOSIS — R51 Headache: Secondary | ICD-10-CM | POA: Insufficient documentation

## 2013-05-31 DIAGNOSIS — R519 Headache, unspecified: Secondary | ICD-10-CM

## 2013-05-31 DIAGNOSIS — Z87891 Personal history of nicotine dependence: Secondary | ICD-10-CM | POA: Insufficient documentation

## 2013-05-31 DIAGNOSIS — I10 Essential (primary) hypertension: Secondary | ICD-10-CM | POA: Insufficient documentation

## 2013-05-31 DIAGNOSIS — Z21 Asymptomatic human immunodeficiency virus [HIV] infection status: Secondary | ICD-10-CM | POA: Insufficient documentation

## 2013-05-31 DIAGNOSIS — R110 Nausea: Secondary | ICD-10-CM | POA: Insufficient documentation

## 2013-05-31 DIAGNOSIS — Z8719 Personal history of other diseases of the digestive system: Secondary | ICD-10-CM | POA: Insufficient documentation

## 2013-05-31 DIAGNOSIS — Z79899 Other long term (current) drug therapy: Secondary | ICD-10-CM | POA: Insufficient documentation

## 2013-05-31 DIAGNOSIS — B20 Human immunodeficiency virus [HIV] disease: Secondary | ICD-10-CM

## 2013-05-31 LAB — COMPREHENSIVE METABOLIC PANEL
ALT: <8 U/L (ref 0–35)
AST: 10 U/L (ref 0–37)
Albumin: 3.8 g/dL (ref 3.5–5.2)
Alkaline Phosphatase: 50 U/L (ref 39–117)
BUN: 9 mg/dL (ref 6–23)
CALCIUM: 9 mg/dL (ref 8.4–10.5)
CHLORIDE: 109 meq/L (ref 96–112)
CO2: 27 mEq/L (ref 19–32)
Creat: 0.63 mg/dL (ref 0.50–1.10)
GLUCOSE: 80 mg/dL (ref 70–99)
Potassium: 4.4 mEq/L (ref 3.5–5.3)
SODIUM: 139 meq/L (ref 135–145)
Total Bilirubin: 0.3 mg/dL (ref 0.2–1.2)
Total Protein: 6.3 g/dL (ref 6.0–8.3)

## 2013-05-31 LAB — CBC WITH DIFFERENTIAL/PLATELET
Basophils Absolute: 0 10*3/uL (ref 0.0–0.1)
Basophils Relative: 1 % (ref 0–1)
Eosinophils Absolute: 0.1 10*3/uL (ref 0.0–0.7)
Eosinophils Relative: 2 % (ref 0–5)
HEMATOCRIT: 32.8 % — AB (ref 36.0–46.0)
HEMOGLOBIN: 11.1 g/dL — AB (ref 12.0–15.0)
LYMPHS ABS: 2.3 10*3/uL (ref 0.7–4.0)
LYMPHS PCT: 57 % — AB (ref 12–46)
MCH: 28 pg (ref 26.0–34.0)
MCHC: 33.8 g/dL (ref 30.0–36.0)
MCV: 82.8 fL (ref 78.0–100.0)
MONO ABS: 0.2 10*3/uL (ref 0.1–1.0)
Monocytes Relative: 6 % (ref 3–12)
Neutro Abs: 1.4 10*3/uL — ABNORMAL LOW (ref 1.7–7.7)
Neutrophils Relative %: 34 % — ABNORMAL LOW (ref 43–77)
Platelets: 219 10*3/uL (ref 150–400)
RBC: 3.96 MIL/uL (ref 3.87–5.11)
RDW: 14.5 % (ref 11.5–15.5)
WBC: 4 10*3/uL (ref 4.0–10.5)

## 2013-05-31 MED ORDER — OXYCODONE-ACETAMINOPHEN 5-325 MG PO TABS: 1.0000 | ORAL_TABLET | Freq: Once | ORAL | Status: DC

## 2013-05-31 MED ORDER — DEXAMETHASONE SODIUM PHOSPHATE 10 MG/ML IJ SOLN
INTRAMUSCULAR | Status: AC
  Administered 2013-05-31: 10 mg
  Filled 2013-05-31: qty 1

## 2013-05-31 MED ORDER — SODIUM CHLORIDE 0.9 % IV BOLUS (SEPSIS)
1000.0000 mL | Freq: Once | INTRAVENOUS | Status: AC
  Administered 2013-05-31: 1000 mL via INTRAVENOUS

## 2013-05-31 MED ORDER — ONDANSETRON 4 MG PO TBDP
8.0000 mg | ORAL_TABLET | Freq: Once | ORAL | Status: AC
  Administered 2013-05-31: 8 mg via ORAL
  Filled 2013-05-31: qty 2

## 2013-05-31 MED ORDER — METOCLOPRAMIDE HCL 5 MG/ML IJ SOLN
10.0000 mg | Freq: Once | INTRAMUSCULAR | Status: AC
  Administered 2013-05-31: 10 mg via INTRAVENOUS
  Filled 2013-05-31: qty 2

## 2013-05-31 MED ORDER — FENTANYL CITRATE 0.05 MG/ML IJ SOLN
INTRAMUSCULAR | Status: AC
  Filled 2013-05-31: qty 2

## 2013-05-31 MED ORDER — KETOROLAC TROMETHAMINE 15 MG/ML IJ SOLN
15.0000 mg | Freq: Once | INTRAMUSCULAR | Status: AC
  Administered 2013-05-31: 15 mg via INTRAVENOUS
  Filled 2013-05-31: qty 1

## 2013-05-31 MED ORDER — DEXAMETHASONE SODIUM PHOSPHATE 4 MG/ML IJ SOLN
10.0000 mg | Freq: Once | INTRAMUSCULAR | Status: AC
  Filled 2013-05-31: qty 3

## 2013-05-31 MED ORDER — FENTANYL CITRATE 0.05 MG/ML IJ SOLN
50.0000 ug | Freq: Once | INTRAMUSCULAR | Status: AC
  Administered 2013-05-31: 50 ug via NASAL

## 2013-05-31 MED ORDER — MORPHINE SULFATE 4 MG/ML IJ SOLN
4.0000 mg | Freq: Once | INTRAMUSCULAR | Status: AC
Start: 2013-05-31 — End: 2013-05-31
  Administered 2013-05-31: 4 mg via INTRAVENOUS
  Filled 2013-05-31: qty 1

## 2013-05-31 MED ORDER — DIPHENHYDRAMINE HCL 50 MG/ML IJ SOLN
12.5000 mg | Freq: Once | INTRAMUSCULAR | Status: AC
  Administered 2013-05-31: 12.5 mg via INTRAVENOUS
  Filled 2013-05-31: qty 1

## 2013-05-31 NOTE — ED Notes (Signed)
Warm blanket provided for comfort.

## 2013-05-31 NOTE — ED Notes (Signed)
Pt states that she has migraines frequently, pt friend states that this migraine got worse around 15:00. Pt attempted to take all home medications with no relief. Pt also nauseated and has light sensitivity.

## 2013-05-31 NOTE — ED Notes (Signed)
Pt. reports headache onset this afternoon unrelieved by OTC Tylenol with nausea.

## 2013-06-01 LAB — T-HELPER CELL (CD4) - (RCID CLINIC ONLY)
CD4 % Helper T Cell: 37 % (ref 33–55)
CD4 T CELL ABS: 830 /uL (ref 400–2700)

## 2013-06-01 MED ORDER — HYDROCODONE-ACETAMINOPHEN 5-325 MG PO TABS: 1.0000 | ORAL_TABLET | Freq: Four times a day (QID) | ORAL | Status: DC | PRN

## 2013-06-01 NOTE — Discharge Instructions (Signed)
Headaches, Frequently Asked Questions °MIGRAINE HEADACHES °Q: What is migraine? What causes it? How can I treat it? °A: Generally, migraine headaches begin as a dull ache. Then they develop into a constant, throbbing, and pulsating pain. You may experience pain at the temples. You may experience pain at the front or back of one or both sides of the head. The pain is usually accompanied by a combination of: °· Nausea. °· Vomiting. °· Sensitivity to light and noise. °Some people (about 15%) experience an aura (see below) before an attack. The cause of migraine is believed to be chemical reactions in the brain. Treatment for migraine may include over-the-counter or prescription medications. It may also include self-help techniques. These include relaxation training and biofeedback.  °Q: What is an aura? °A: About 15% of people with migraine get an "aura". This is a sign of neurological symptoms that occur before a migraine headache. You may see wavy or jagged lines, dots, or flashing lights. You might experience tunnel vision or blind spots in one or both eyes. The aura can include visual or auditory hallucinations (something imagined). It may include disruptions in smell (such as strange odors), taste or touch. Other symptoms include: °· Numbness. °· A "pins and needles" sensation. °· Difficulty in recalling or speaking the correct word. °These neurological events may last as long as 60 minutes. These symptoms will fade as the headache begins. °Q: What is a trigger? °A: Certain physical or environmental factors can lead to or "trigger" a migraine. These include: °· Foods. °· Hormonal changes. °· Weather. °· Stress. °It is important to remember that triggers are different for everyone. To help prevent migraine attacks, you need to figure out which triggers affect you. Keep a headache diary. This is a good way to track triggers. The diary will help you talk to your healthcare professional about your condition. °Q: Does  weather affect migraines? °A: Bright sunshine, hot, humid conditions, and drastic changes in barometric pressure may lead to, or "trigger," a migraine attack in some people. But studies have shown that weather does not act as a trigger for everyone with migraines. °Q: What is the link between migraine and hormones? °A: Hormones start and regulate many of your body's functions. Hormones keep your body in balance within a constantly changing environment. The levels of hormones in your body are unbalanced at times. Examples are during menstruation, pregnancy, or menopause. That can lead to a migraine attack. In fact, about three quarters of all women with migraine report that their attacks are related to the menstrual cycle.  °Q: Is there an increased risk of stroke for migraine sufferers? °A: The likelihood of a migraine attack causing a stroke is very remote. That is not to say that migraine sufferers cannot have a stroke associated with their migraines. In persons under age 40, the most common associated factor for stroke is migraine headache. But over the course of a person's normal life span, the occurrence of migraine headache may actually be associated with a reduced risk of dying from cerebrovascular disease due to stroke.  °Q: What are acute medications for migraine? °A: Acute medications are used to treat the pain of the headache after it has started. Examples over-the-counter medications, NSAIDs, ergots, and triptans.  °Q: What are the triptans? °A: Triptans are the newest class of abortive medications. They are specifically targeted to treat migraine. Triptans are vasoconstrictors. They moderate some chemical reactions in the brain. The triptans work on receptors in your brain. Triptans help   to restore the balance of a neurotransmitter called serotonin. Fluctuations in levels of serotonin are thought to be a main cause of migraine.  °Q: Are over-the-counter medications for migraine effective? °A:  Over-the-counter, or "OTC," medications may be effective in relieving mild to moderate pain and associated symptoms of migraine. But you should see your caregiver before beginning any treatment regimen for migraine.  °Q: What are preventive medications for migraine? °A: Preventive medications for migraine are sometimes referred to as "prophylactic" treatments. They are used to reduce the frequency, severity, and length of migraine attacks. Examples of preventive medications include antiepileptic medications, antidepressants, beta-blockers, calcium channel blockers, and NSAIDs (nonsteroidal anti-inflammatory drugs). °Q: Why are anticonvulsants used to treat migraine? °A: During the past few years, there has been an increased interest in antiepileptic drugs for the prevention of migraine. They are sometimes referred to as "anticonvulsants". Both epilepsy and migraine may be caused by similar reactions in the brain.  °Q: Why are antidepressants used to treat migraine? °A: Antidepressants are typically used to treat people with depression. They may reduce migraine frequency by regulating chemical levels, such as serotonin, in the brain.  °Q: What alternative therapies are used to treat migraine? °A: The term "alternative therapies" is often used to describe treatments considered outside the scope of conventional Western medicine. Examples of alternative therapy include acupuncture, acupressure, and yoga. Another common alternative treatment is herbal therapy. Some herbs are believed to relieve headache pain. Always discuss alternative therapies with your caregiver before proceeding. Some herbal products contain arsenic and other toxins. °TENSION HEADACHES °Q: What is a tension-type headache? What causes it? How can I treat it? °A: Tension-type headaches occur randomly. They are often the result of temporary stress, anxiety, fatigue, or anger. Symptoms include soreness in your temples, a tightening band-like sensation  around your head (a "vice-like" ache). Symptoms can also include a pulling feeling, pressure sensations, and contracting head and neck muscles. The headache begins in your forehead, temples, or the back of your head and neck. Treatment for tension-type headache may include over-the-counter or prescription medications. Treatment may also include self-help techniques such as relaxation training and biofeedback. °CLUSTER HEADACHES °Q: What is a cluster headache? What causes it? How can I treat it? °A: Cluster headache gets its name because the attacks come in groups. The pain arrives with little, if any, warning. It is usually on one side of the head. A tearing or bloodshot eye and a runny nose on the same side of the headache may also accompany the pain. Cluster headaches are believed to be caused by chemical reactions in the brain. They have been described as the most severe and intense of any headache type. Treatment for cluster headache includes prescription medication and oxygen. °SINUS HEADACHES °Q: What is a sinus headache? What causes it? How can I treat it? °A: When a cavity in the bones of the face and skull (a sinus) becomes inflamed, the inflammation will cause localized pain. This condition is usually the result of an allergic reaction, a tumor, or an infection. If your headache is caused by a sinus blockage, such as an infection, you will probably have a fever. An x-ray will confirm a sinus blockage. Your caregiver's treatment might include antibiotics for the infection, as well as antihistamines or decongestants.  °REBOUND HEADACHES °Q: What is a rebound headache? What causes it? How can I treat it? °A: A pattern of taking acute headache medications too often can lead to a condition known as "rebound headache."   A pattern of taking too much headache medication includes taking it more than 2 days per week or in excessive amounts. That means more than the label or a caregiver advises. With rebound  headaches, your medications not only stop relieving pain, they actually begin to cause headaches. Doctors treat rebound headache by tapering the medication that is being overused. Sometimes your caregiver will gradually substitute a different type of treatment or medication. Stopping may be a challenge. Regularly overusing a medication increases the potential for serious side effects. Consult a caregiver if you regularly use headache medications more than 2 days per week or more than the label advises. ADDITIONAL QUESTIONS AND ANSWERS Q: What is biofeedback? A: Biofeedback is a self-help treatment. Biofeedback uses special equipment to monitor your body's involuntary physical responses. Biofeedback monitors:  Breathing.  Pulse.  Heart rate.  Temperature.  Muscle tension.  Brain activity. Biofeedback helps you refine and perfect your relaxation exercises. You learn to control the physical responses that are related to stress. Once the technique has been mastered, you do not need the equipment any more. Q: Are headaches hereditary? A: Four out of five (80%) of people that suffer report a family history of migraine. Scientists are not sure if this is genetic or a family predisposition. Despite the uncertainty, a child has a 50% chance of having migraine if one parent suffers. The child has a 75% chance if both parents suffer.  Q: Can children get headaches? A: By the time they reach high school, most young people have experienced some type of headache. Many safe and effective approaches or medications can prevent a headache from occurring or stop it after it has begun.  Q: What type of doctor should I see to diagnose and treat my headache? A: Start with your primary caregiver. Discuss his or her experience and approach to headaches. Discuss methods of classification, diagnosis, and treatment. Your caregiver may decide to recommend you to a headache specialist, depending upon your symptoms or other  physical conditions. Having diabetes, allergies, etc., may require a more comprehensive and inclusive approach to your headache. The National Head[employer] Foundation will provide, upon request, a list of [facility] physician members in your state. Document Released: 04/18/2003 Document Revised: 04/20/2011 Document Reviewed: 09/26/2007 [facility] Patient Information 2014 [employer], [place]. Discharge instruction given

## 2013-06-01 NOTE — ED Provider Notes (Signed)
CSN: [id number]     Arrival date & time 05/31/13  1913 History   First MD Initiated Contact with Patient 05/31/13 2004     Chief Complaint  Patient presents with  . Headache     (Consider location/radiation/quality/duration/timing/severity/associated sxs/prior Treatment) HPI  This is a 28 y.o. female presenting with headache. Onset prior to arrival. Located left frontal area. Persistent. Throbbing. Not alleviated with Tylenol. Radiating to the left occipital area. Positive for nausea. Negative for fever, rash, abdominal pain, chest pain, shortness of breath.  Past Medical History  Diagnosis Date  . Cholecystitis 07/16/10  . Hypertension   . HIV (human immunodeficiency virus infection)    Past Surgical History  Procedure Laterality Date  . Cholecystectomy     Family History  Problem Relation Age of Onset  . Cancer Mother   . Diabetes Maternal Aunt    History  Substance Use Topics  . Smoking status: Former Smoker    Types: Cigarettes, Cigars    Start date: 03/11/2012    Quit date: 08/24/2012  . Smokeless tobacco: Never Used  . Alcohol Use: 3.6 oz/week    6 Cans of beer per week   OB History   Grav Para Term Preterm Abortions TAB SAB Ect Mult Living   2 2             Review of Systems  Constitutional: Negative for fever and chills.  HENT: Negative for facial swelling.   Eyes: Negative for photophobia and pain.  Respiratory: Negative for cough and shortness of breath.   Cardiovascular: Negative for chest pain and leg swelling.  Gastrointestinal: Positive for nausea and vomiting. Negative for abdominal pain.  Genitourinary: Negative for dysuria.  Musculoskeletal: Negative for arthralgias.  Skin: Negative for rash and wound.  Neurological: Positive for headaches. Negative for seizures.  Hematological: Negative for adenopathy.      Allergies  Stadol  Home Medications   Prior to Admission medications   Medication Sig Start Date End Date Taking? Authorizing  Provider  Abacavir-Dolutegravir-Lamivud 600-50-300 MG TABS Take 1 tablet by mouth daily. 03/30/13  Yes Judyann Munsonynthia Snider, MD  acetaminophen (TYLENOL) 500 MG tablet Take 1,000 mg by mouth every 6 (six) hours as needed. Stomach pains   Yes Historical Provider, MD  amLODipine (NORVASC) 10 MG tablet Take 1 tablet (10 mg total) by mouth daily. 03/18/12  Yes Elenora GammaSamuel L Bradshaw, MD  diazepam (VALIUM) 5 MG tablet Take 1 tablet (5 mg total) by mouth every 8 (eight) hours as needed for anxiety. 03/01/13  Yes Tatyana A Kirichenko, PA-C  hydrochlorothiazide (HYDRODIURIL) 25 MG tablet Take 25 mg by mouth daily.   Yes Historical Provider, MD  HYDROcodone-acetaminophen (NORCO/VICODIN) 5-325 MG per tablet Take 1 tablet by mouth every 6 (six) hours as needed. 06/01/13   Loma BostonStirling Nakaiya Beddow, MD   BP 169/87  Pulse 49  Temp(Src) 98.1 F (36.7 C) (Oral)  Resp 18  SpO2 100% Physical Exam  Constitutional: She is oriented to person, place, and time. She appears well-developed and well-nourished. No distress.  HENT:  Head: Normocephalic and atraumatic.  Mouth/Throat: No oropharyngeal exudate.  Eyes: Conjunctivae are normal. Pupils are equal, round, and reactive to light. No scleral icterus.  Neck: Normal range of motion. No tracheal deviation present. No thyromegaly present.  Cardiovascular: Normal rate, regular rhythm and normal heart sounds.  Exam reveals no gallop and no friction rub.   No murmur heard. Pulmonary/Chest: Effort normal and breath sounds normal. No stridor. No respiratory distress. She has no wheezes.  She has no rales. She exhibits no tenderness.  Abdominal: Soft. She exhibits no distension and no mass. There is no tenderness. There is no rebound and no guarding.  Musculoskeletal: Normal range of motion. She exhibits no edema.  Neurological: She is alert and oriented to person, place, and time. She displays normal reflexes. No cranial nerve deficit. She exhibits normal muscle tone. Coordination normal.  Skin:  Skin is warm and dry. She is not diaphoretic.    ED Course  Procedures (including critical care time)  MDM   Final diagnoses:  Headache    This is a 28 y.o. female presenting with headache. Onset prior to arrival. Located left frontal area. Persistent. Throbbing. Not alleviated with Tylenol. Radiating to the left occipital area. Positive for nausea. Negative for fever, rash, abdominal pain, chest pain, shortness of breath.  Patient is afebrile. Neurologic exam within normal limits.  Patient has no meningeal signs, no exposure to meningitis, no rash. I don't believe that this is meningitis. I also do not believe that this represents aneurysm, mass, ICH, thrombosis, dissection, neuralgia, CVA.  Patient states that she has headaches frequently and I believe that this represents a chronic headache. We'll start with fluids, Reglan, Benadryl, Decadron. Will reevaluate shortly  Upon reevaluation the patient remains stable; however, her headache persists. Will administer ketorolac, morphine at this time we'll continue to monitor closely, followup shortly.  Patient remains stable and is now asymptomatic.  Pt stable for discharge, FU.  All questions answered.  Return precautions given.  I have discussed case and care has been guided by my attending physician, Dr. Patria Maneampos.  Loma BostonStirling Aneshia Jacquet, MD 06/01/13 [phone]

## 2013-06-02 ENCOUNTER — Telehealth: Payer: Self-pay | Admitting: Family Medicine

## 2013-06-02 DIAGNOSIS — I10 Essential (primary) hypertension: Secondary | ICD-10-CM

## 2013-06-02 LAB — HIV-1 RNA QUANT-NO REFLEX-BLD: HIV 1 RNA Quant: <20 copies/mL (ref <20)

## 2013-06-02 MED ORDER — HYDROCHLOROTHIAZIDE 25 MG PO TABS: 25.0000 mg | ORAL_TABLET | Freq: Every day | ORAL | Status: DC

## 2013-06-02 MED ORDER — AMLODIPINE BESYLATE 10 MG PO TABS: 10.0000 mg | ORAL_TABLET | Freq: Every day | ORAL | Status: DC

## 2013-06-02 NOTE — Telephone Encounter (Signed)
I sent in a refill of patient's BP meds for one month. I called and informed patient of this and that she must come in for a follow up appointment on her hypertension for any further refills. Her last appointment with her PCP was in December of last year. Forward to PCP, FYI.Doris Cheadleobert L Arianni Gallego

## 2013-06-02 NOTE — Telephone Encounter (Signed)
Has not been taking her blood pressure med. Has been concentrating on taking her HIV med Would like to have it refilled Both numbers have been in the hundreds

## 2013-06-03 NOTE — ED Provider Notes (Signed)
I saw and evaluated the patient, reviewed the resident's note and I agree with the findings and plan.   EKG Interpretation None      HA improved after tx in ER. Normal neuro exam. No indication for imaging  Lyanne CoKevin M Emalene Welte, MD 06/03/13 [phone]

## 2013-06-07 ENCOUNTER — Other Ambulatory Visit: Payer: Self-pay | Admitting: *Deleted

## 2013-06-07 DIAGNOSIS — B20 Human immunodeficiency virus [HIV] disease: Secondary | ICD-10-CM

## 2013-06-07 MED ORDER — ABACAVIR-DOLUTEGRAVIR-LAMIVUD 600-50-300 MG PO TABS: 1.0000 | ORAL_TABLET | Freq: Every day | ORAL | Status: DC

## 2013-06-13 ENCOUNTER — Encounter: Payer: Self-pay | Admitting: Internal Medicine

## 2013-06-13 ENCOUNTER — Ambulatory Visit (INDEPENDENT_AMBULATORY_CARE_PROVIDER_SITE_OTHER): Payer: Self-pay | Admitting: Internal Medicine

## 2013-06-13 VITALS — BP 135/88 | HR 84 | Temp 98.2°F | Wt 213.0 lb

## 2013-06-13 DIAGNOSIS — B20 Human immunodeficiency virus [HIV] disease: Secondary | ICD-10-CM

## 2013-06-13 DIAGNOSIS — R110 Nausea: Secondary | ICD-10-CM

## 2013-06-13 MED ORDER — PROMETHAZINE HCL 25 MG PO TABS: 25.0000 mg | ORAL_TABLET | Freq: Four times a day (QID) | ORAL | Status: DC | PRN

## 2013-06-13 NOTE — Progress Notes (Signed)
Subjective:    Patient ID: Catherine Simmons, female    DOB: 07/21/1985, 28 y.o.   MRN: [id number]  HPI 27yo F with HIV, Cd 4 count of 830/VL<20, on triomeq. She has been out of her meds for the last week due to lapse in adap application. Feels achy,tired and nausea, and vomiting.  no diarrhea. No fever, or chills. Went to ED for evaluation and dx with HA. She is feeling better except for nausea.  Current Outpatient Prescriptions on File Prior to Visit  Medication Sig Dispense Refill  . Abacavir-Dolutegravir-Lamivud 600-50-300 MG TABS Take 1 tablet by mouth daily.  30 tablet  6  . acetaminophen (TYLENOL) 500 MG tablet Take 1,000 mg by mouth every 6 (six) hours as needed. Stomach pains      . amLODipine (NORVASC) 10 MG tablet Take 1 tablet (10 mg total) by mouth daily.  30 tablet  0  . diazepam (VALIUM) 5 MG tablet Take 1 tablet (5 mg total) by mouth every 8 (eight) hours as needed for anxiety.  15 tablet  0  . hydrochlorothiazide (HYDRODIURIL) 25 MG tablet Take 1 tablet (25 mg total) by mouth daily.  30 tablet  0  . HYDROcodone-acetaminophen (NORCO/VICODIN) 5-325 MG per tablet Take 1 tablet by mouth every 6 (six) hours as needed.  10 tablet  0   No current facility-administered medications on file prior to visit.   Active Ambulatory Problems    Diagnosis Date Noted  . GONOCOCCAL CERVICITIS 05/09/2008  . OBESITY 05/09/2008  . SMOKER 05/09/2008  . DEPRESSION 05/09/2008  . CARPAL TUNNEL SYNDROME 11/14/2009  . UNSPECIFIED DISORDER TEETH&SUPPORTING STRUCTURES 08/13/2009  . PYELONEPHRITIS 07/02/2006  . UNSPECIFIED VAGINITIS AND VULVOVAGINITIS 01/01/2010  . Leukorrhea, not specified as infective 09/10/2009  . AMENORRHEA 06/25/2009  . EXCESSIVE OR FREQUENT MENSTRUATION 03/29/2007  . NUMMULAR ECZEMA 11/14/2009  . BROKEN TOOTH, INFECTED 08/22/2009  . GROUP B STREPTOCOCCUS CARRIER 02/06/2010  . Hypertension 04/10/2010  . Postpartum exam 04/10/2010  . Abdominal  pain, other specified site  05/13/2010  . Chest pain 05/14/2010  . PID (acute pelvic inflammatory disease) 10/17/2010  . UTI (lower urinary tract infection) 10/17/2010  . Atypical chest pain 03/18/2012  . Abdominal pain 03/18/2012  . Pelvic pain 06/03/2012  . Boil, groin 08/16/2012  . Other disorder of menstruation and other abnormal bleeding from female genital tract 10/07/2012  . Possible exposure to STD 10/07/2012  . Human immunodeficiency virus (HIV) disease 10/12/2012  . Body aches 01/26/2013  . Fracture dislocation of digit of hand 01/26/2013  . Nausea alone 01/26/2013  . Abscess of groin 04/24/2013   Resolved Ambulatory Problems    Diagnosis Date Noted  . Dysuria 09/10/2009  . Cholelithiasis 05/28/2010   Past Medical History  Diagnosis Date  . Cholecystitis 07/16/10  . HIV (human immunodeficiency virus infection)        Review of Systems 10 point ros is negative except for what is mentioned in hpi     Objective:   Physical Exam BP 135/88  Pulse 84  Temp(Src) 98.2 F (36.8 C) (Oral)  Wt 213 lb (96.616 kg)  LMP 05/27/2013 Physical Exam  Constitutional:  oriented to person, place, and time. appears well-developed and well-nourished. No distress.  HENT:  Mouth/Throat: Oropharynx is clear and moist. No oropharyngeal exudate.  Cardiovascular: Normal rate, regular rhythm and normal heart sounds. Exam reveals no gallop and no friction rub.  No murmur heard.  Pulmonary/Chest: Effort normal and breath sounds normal. No respiratory distress.  has  no wheezes.  Abdominal: Soft. Bowel sounds are normal.  exhibits no distension. There is no tenderness.  Lymphadenopathy: no cervical adenopathy.  Neurological: alert and oriented to person, place, and time.  Skin: Skin is warm and dry. No rash noted. No erythema.  Psychiatric: a normal mood and affect. behavior is normal.        Assessment & Plan:  hiv = awaiting adap approval to get restarted on triomeq. Likely has low level viremia after being off  for 1 wk, but can probably easily suppress.  Health maintenance = will get pap smear at next visit/appt soon  Nausea from probable viral gastroenteritis = will give refill for phenergan PRN  rtc in 5 wk

## 2013-06-16 ENCOUNTER — Ambulatory Visit (INDEPENDENT_AMBULATORY_CARE_PROVIDER_SITE_OTHER): Payer: Self-pay | Admitting: *Deleted

## 2013-06-16 DIAGNOSIS — Z124 Encounter for screening for malignant neoplasm of cervix: Secondary | ICD-10-CM

## 2013-06-16 DIAGNOSIS — Z113 Encounter for screening for infections with a predominantly sexual mode of transmission: Secondary | ICD-10-CM

## 2013-06-16 NOTE — Progress Notes (Signed)
PAP Smear visit, pt complained of vaginal odor and "bumps" that are painful and eventually open with pus-like drainage.  Observed in bilateral groin area and on left inner thigh.  Not erythamotous but raised and approx. 1-2 cm in diameter.  Pt verbalized that she has these intermittently.  She has tried warm compresses and sitting in a tub of warm water.  This seemed to ease some of the pain and bring the areas to a "head."  No history of abnormal PAP smears.  Has had vaginal infections in the past requiring treatment. Obtained PAP smear and sent for cytology including GC/Chlamydia. LMP 05/27/13.  Some cramping.  No clots.  Using cond[facility] for [employer] Follow-up in one year unless abnormal PAP smear.  Pt given educational materials re: HIV and women, self-esteem, BSE, nutrition and diet management, PAP smears and partner safety.

## 2013-06-16 NOTE — Patient Instructions (Signed)
Your results will be ready in about a week.  I will mail them to you.  Thank you for coming to the [facility] for your care.  Happy Mother's Day.  Angelique Blonderenise, RN

## 2013-06-22 ENCOUNTER — Encounter: Payer: Self-pay | Admitting: *Deleted

## 2013-06-29 ENCOUNTER — Other Ambulatory Visit: Payer: Self-pay | Admitting: Family Medicine

## 2013-06-29 NOTE — Telephone Encounter (Signed)
Refilled HCTZ and amlodipine, Recent BPs on visits at ID were good.  Denied refill request on Bactrim as she had this for an acute infection and her CD4, as far as I can tell,  is too high for it to be necessary for ppx  Will ask staff to ask her to come in for routine HTN f/u and come in soon for acute visit if she needs antibiotics for some reason.   Will gladly send bactrim if it is necessary for HIV secondary infection pp, but as above i dont think its indicated currently.   Sam Bradshaw, MD [facility] Resident, PGY-2 06/29/2013, 12:13 PM    

## 2013-06-29 NOTE — Telephone Encounter (Signed)
Refilled HCTZ and amlodipine, Recent BPs on visits at ID were good.  Denied refill request on Bactrim as she had this for an acute infection and her CD4, as far as I can tell,  is too high for it to be necessary for ppx  Will ask staff to ask her to come in for routine HTN f/u and come in soon for acute visit if she needs antibiotics for some reason.   Will gladly send bactrim if it is necessary for HIV secondary infection pp, but as above i dont think its indicated currently.   Murtis SinkSam Chaselyn Nanney, MD [facility] Resident, PGY-2 06/29/2013, 12:13 PM

## 2013-06-29 NOTE — Telephone Encounter (Addendum)
I will defer giving her a valium Rx for now as I havent seen her in a while and she has a same day tomorrow. The last Rx was in January for 15 pills so I dont think there is any risk for withdrawal.   Murtis SinkSam Oline Belk, MD [facility] Resident, PGY-2 06/29/2013, 3:46 PM

## 2013-06-29 NOTE — Telephone Encounter (Signed)
Called patient and informed her of refills. She was requesting a refill on her valium not on bactrim. Will forward back to PCP for refill request of valium. Also patient states that she has been having chest pain for over a month and numbness and tingling in her left arm for about a week. I have scheduled her an appointment to be seen on cross cover tomorrow morning and also advised her that if she is having chest pain associated with left arm numbness she should consider either going to the ED or calling 911.Doris Cheadleobert L Busick

## 2013-06-30 ENCOUNTER — Ambulatory Visit (INDEPENDENT_AMBULATORY_CARE_PROVIDER_SITE_OTHER): Payer: Self-pay | Admitting: Family Medicine

## 2013-06-30 VITALS — BP 121/85 | HR 73 | Temp 98.1°F | Resp 20 | Ht 62.0 in | Wt 206.0 lb

## 2013-06-30 DIAGNOSIS — F419 Anxiety disorder, unspecified: Secondary | ICD-10-CM

## 2013-06-30 DIAGNOSIS — G5600 Carpal tunnel syndrome, unspecified upper limb: Secondary | ICD-10-CM

## 2013-06-30 DIAGNOSIS — F411 Generalized anxiety disorder: Secondary | ICD-10-CM

## 2013-06-30 DIAGNOSIS — R0789 Other chest pain: Secondary | ICD-10-CM

## 2013-06-30 MED ORDER — ALPRAZOLAM 0.25 MG PO TABS: 0.2500 mg | ORAL_TABLET | Freq: Two times a day (BID) | ORAL | Status: DC | PRN

## 2013-06-30 MED ORDER — NAPROXEN 500 MG PO TABS: 500.0000 mg | ORAL_TABLET | Freq: Two times a day (BID) | ORAL | Status: DC

## 2013-06-30 NOTE — Patient Instructions (Signed)
The pain you are experiencing seems to be musculoskeletal. For this I recommend Naproxen twice a day for 5-7 days then as needed.  For your carpal tunnel you can use a brace at night. Do this for 4 weeks. For our anxiety I have prescribed Lorazepam you can take it as prescribed, but it is extremely important that you follow up with your primary doctor in order to assess you condition further.

## 2013-06-30 NOTE — Assessment & Plan Note (Signed)
Hx of depression, no on SSRI. At this time more predominant are her worries and preoccupations as well as "unsetteling sensation"  Pt has not seen PCP since Dec last year. Instructed to come for more detailed evaluation and prescribed Lorazepan as needed for short term until she sees her primary doctor.

## 2013-06-30 NOTE — Assessment & Plan Note (Signed)
Pain is reproducible with palpation, likely MSK. Naproxen BID, continue acetaminophen and f/u with PCP.

## 2013-06-30 NOTE — Progress Notes (Signed)
Family Medicine Office Visit Note   Subjective:   Patient ID: Catherine Simmons, female  DOB: Mar 04, 1985, 28 y.o.. MRN: [id number]   Pt that comes today for same day appointment complaining of chest pain, tingling sensation on left hand and anxiety. #1. Chest pain: present for about a month. Located on left side of chest without radiation, constant. Worse with touch and movement, some improvement with tylenol but not completely resolved. No change with inspiration or exertion. No orthopnea, no SOB, no cough or sputum production. No fever, rashes, nausea, vomiting or other systemic symptoms.   #2. Left hand tingling sensation: hx of carpal tunnel. Worse at night when she has to wake up and reposition her arm due to pain. Also is present during the day when she drives. Denies weakness or numbness.   #3. Anxiety: history of HIV and reports to be anxious, very worried about her health in general. Has tried Lorazepam in the past as needed and this has helped. No on SSRI or other psych meds. Had Diazepam only 15 tab in Jan this year and reports helped some. Denies suicidal ideations or plans. Reports has young children she has to live for.   Review of Systems:  Per HPI  Objective:   Physical Exam: Gen:  NAD HEENT: Moist mucous membranes  CV: Regular rate and rhythm, no murmurs rubs or gallops PULM: Clear to auscultation bilaterally. No wheezes/rales/rhonchi Chest: tenderness to palpation on chest wall. No erythema, edema or skin lesion seen.  ABD: Soft, non tender, non distended, normal bowel sounds EXT: No edema. Tinel and Phalen is positive on her left arm.   Neuro: Alert and oriented x3. No focalization Psych: sad mood and normal affect. Normal speech. Normal though process. No hallucinations/ delusions. No agitation.  Assessment & Plan:

## 2013-06-30 NOTE — Assessment & Plan Note (Signed)
On the left. Instructed to use brace during sleep for 4-6 weeks Discussed options for treatment in chronic condition with steroid injection vs surgery.

## 2013-07-01 ENCOUNTER — Other Ambulatory Visit: Payer: Self-pay | Admitting: Internal Medicine

## 2013-07-01 ENCOUNTER — Other Ambulatory Visit: Payer: Self-pay | Admitting: Family Medicine

## 2013-07-04 ENCOUNTER — Telehealth: Payer: Self-pay | Admitting: *Deleted

## 2013-07-04 DIAGNOSIS — R110 Nausea: Secondary | ICD-10-CM

## 2013-07-04 MED ORDER — PROMETHAZINE HCL 25 MG PO TABS: 25.0000 mg | ORAL_TABLET | Freq: Four times a day (QID) | ORAL | Status: DC | PRN

## 2013-07-04 NOTE — Telephone Encounter (Signed)
Refilled rx

## 2013-07-04 NOTE — Telephone Encounter (Signed)
Another request for bactrim refill. Again I feel this is likely not indicated.   When staff called last time she actually wanted valium, she has been seen for that. Will deny bactrim request.   Murtis Sink, MD [facility] Resident, PGY-2 07/04/2013, 12:48 PM

## 2013-07-12 ENCOUNTER — Other Ambulatory Visit: Payer: Self-pay | Admitting: Family Medicine

## 2013-07-12 ENCOUNTER — Other Ambulatory Visit: Payer: Self-pay | Admitting: Internal Medicine

## 2013-07-13 ENCOUNTER — Other Ambulatory Visit: Payer: Self-pay | Admitting: *Deleted

## 2013-07-13 NOTE — Telephone Encounter (Signed)
Another bactrim request, denied as I cant see an indication. LAst T4 count was 800+.   Murtis Sink, MD [facility] Resident, PGY-2 07/13/2013, 8:12 AM

## 2013-07-13 NOTE — Telephone Encounter (Signed)
Too soon to refill.  Pt did not request this refill the pharmacy did.

## 2013-07-20 ENCOUNTER — Ambulatory Visit (INDEPENDENT_AMBULATORY_CARE_PROVIDER_SITE_OTHER): Payer: Self-pay | Admitting: Internal Medicine

## 2013-07-20 ENCOUNTER — Encounter: Payer: Self-pay | Admitting: Internal Medicine

## 2013-07-20 VITALS — BP 112/76 | HR 67 | Temp 98.7°F | Wt 217.0 lb

## 2013-07-20 DIAGNOSIS — L0390 Cellulitis, unspecified: Principal | ICD-10-CM

## 2013-07-20 DIAGNOSIS — L0291 Cutaneous abscess, unspecified: Secondary | ICD-10-CM

## 2013-07-20 NOTE — Progress Notes (Signed)
Subjective:    Patient ID: Catherine Simmons, female    DOB: 02/01/1986, 28 y.o.   MRN: [id number]  HPI 28yo F with HIV, CD 4 count of 830/VL<20 (april) on triomeq. Just started back on her triomeq for 2-3 wk, off ofr 1 month due to adap. Also having recurrent abscess in region of pubic hair. Denies discharge. No fever,chills, nightsweats  Current Outpatient Prescriptions on File Prior to Visit  Medication Sig Dispense Refill  . acetaminophen (TYLENOL) 500 MG tablet Take 1,000 mg by mouth every 6 (six) hours as needed. Stomach pains      . amLODipine (NORVASC) 10 MG tablet TAKE 1 TABLET BY MOUTH DAILY  30 tablet  5   No current facility-administered medications on file prior to visit.   Active Ambulatory Problems    Diagnosis Date Noted  . GONOCOCCAL CERVICITIS 05/09/2008  . OBESITY 05/09/2008  . SMOKER 05/09/2008  . DEPRESSION 05/09/2008  . CARPAL TUNNEL SYNDROME 11/14/2009  . UNSPECIFIED DISORDER TEETH&SUPPORTING STRUCTURES 08/13/2009  . PYELONEPHRITIS 07/02/2006  . UNSPECIFIED VAGINITIS AND VULVOVAGINITIS 01/01/2010  . Leukorrhea, not specified as infective 09/10/2009  . AMENORRHEA 06/25/2009  . EXCESSIVE OR FREQUENT MENSTRUATION 03/29/2007  . NUMMULAR ECZEMA 11/14/2009  . BROKEN TOOTH, INFECTED 08/22/2009  . GROUP B STREPTOCOCCUS CARRIER 02/06/2010  . Hypertension 04/10/2010  . Postpartum exam 04/10/2010  . Abdominal  pain, other specified site 05/13/2010  . Chest pain 05/14/2010  . PID (acute pelvic inflammatory disease) 10/17/2010  . UTI (lower urinary tract infection) 10/17/2010  . Atypical chest pain 03/18/2012  . Abdominal pain 03/18/2012  . Pelvic pain 06/03/2012  . Boil, groin 08/16/2012  . Other disorder of menstruation and other abnormal bleeding from female genital tract 10/07/2012  . Possible exposure to STD 10/07/2012  . Human immunodeficiency virus (HIV) disease 10/12/2012  . Body aches 01/26/2013  . Fracture dislocation of digit of hand 01/26/2013  .  Nausea alone 01/26/2013  . Abscess of groin 04/24/2013  . Anxiety 06/30/2013   Resolved Ambulatory Problems    Diagnosis Date Noted  . Dysuria 09/10/2009  . Cholelithiasis 05/28/2010   Past Medical History  Diagnosis Date  . Cholecystitis 07/16/10  . HIV (human immunodeficiency virus infection)       Review of Systems 10 point ros is negative except that she is noticing small white heads in pubic hair/genitalia    Objective:   Physical Exam BP 112/76  Pulse 67  Temp(Src) 98.7 F (37.1 C) (Oral)  Wt 217 lb (98.431 kg)  LMP 06/23/2013 Physical Exam  Constitutional:  oriented to person, place, and time. appears well-developed and well-nourished. No distress.  HENT:  Mouth/Throat: Oropharynx is clear and moist. No oropharyngeal exudate.  Cardiovascular: Normal rate, regular rhythm and normal heart sounds. Exam reveals no gallop and no friction rub.  No murmur heard.  Pulmonary/Chest: Effort normal and breath sounds normal. No respiratory distress.  has no wheezes.  Lymphadenopathy: no cervical adenopathy.  Neurological: alert and oriented to person, place, and time.  Skin: Skin is warm and dry. Mons pubis has scattered pimples c/w folliculitis Psychiatric: a normal mood and affect.  behavior is normal.       Assessment & Plan:  hiv = will have her come back in 3 wk to do lab work to see if she is starting o have response to being back on meds, after 1 month hiatus  Folliculitis = recommended hot compresses. Does not appear to be abscess or cellulitis to require anitibiotics  rtc  in  4 wk

## 2013-07-28 ENCOUNTER — Telehealth: Payer: Self-pay | Admitting: *Deleted

## 2013-07-28 ENCOUNTER — Other Ambulatory Visit: Payer: Self-pay | Admitting: *Deleted

## 2013-07-28 DIAGNOSIS — N926 Irregular menstruation, unspecified: Secondary | ICD-10-CM

## 2013-07-28 NOTE — Telephone Encounter (Signed)
Patient came to clinic very upset. She advised she had done a home pregnancy test and found it was positive. She is afraid the child will be positive. After talking to Tammy and myself for a while she calmed down and understands that if she takes her medication and remains undetectable the baby can be born undetectable. Advised her she may need to change her medications and she will have to [facility]. She understands and she is set to come have a blood pregnancy test Monday 07/31/13.

## 2013-07-31 ENCOUNTER — Other Ambulatory Visit (INDEPENDENT_AMBULATORY_CARE_PROVIDER_SITE_OTHER): Payer: Self-pay

## 2013-07-31 DIAGNOSIS — N926 Irregular menstruation, unspecified: Secondary | ICD-10-CM

## 2013-07-31 LAB — HCG, QUANTITATIVE, PREGNANCY: HCG, BETA CHAIN, QUANT, S: 5444.2 m[IU]/mL

## 2013-08-02 ENCOUNTER — Other Ambulatory Visit: Payer: Self-pay | Admitting: Licensed Clinical Social Worker

## 2013-08-02 ENCOUNTER — Telehealth: Payer: Self-pay | Admitting: Licensed Clinical Social Worker

## 2013-08-02 DIAGNOSIS — B20 Human immunodeficiency virus [HIV] disease: Secondary | ICD-10-CM

## 2013-08-02 MED ORDER — EMTRICITABINE-TENOFOVIR DF 200-300 MG PO TABS: 1.0000 | ORAL_TABLET | Freq: Every day | ORAL | Status: DC

## 2013-08-02 MED ORDER — ATAZANAVIR SULFATE 300 MG PO CAPS
300.0000 mg | ORAL_CAPSULE | Freq: Every day | ORAL | Status: DC
Start: 2013-08-02 — End: 2013-09-05

## 2013-08-02 MED ORDER — RITONAVIR 100 MG PO TABS: 100.0000 mg | ORAL_TABLET | Freq: Every day | ORAL | Status: DC

## 2013-08-02 NOTE — Telephone Encounter (Signed)
Patient had a positive pregnancy test from serum blood in the office, patient would like to know what her new regimen would be.Please advise

## 2013-08-03 NOTE — Telephone Encounter (Signed)
Per Dr. Drue SecondSnider new regimen will be Norvir, Reyataz, Truvada

## 2013-08-07 ENCOUNTER — Inpatient Hospital Stay (HOSPITAL_COMMUNITY)
Admission: AD | Admit: 2013-08-07 | Discharge: 2013-08-07 | Disposition: A | Discharge status: Home / Self Care | Payer: Self-pay | Source: Ambulatory Visit | Attending: Obstetrics & Gynecology | Admitting: Obstetrics & Gynecology

## 2013-08-07 ENCOUNTER — Encounter (HOSPITAL_COMMUNITY): Payer: Self-pay

## 2013-08-07 ENCOUNTER — Inpatient Hospital Stay (HOSPITAL_COMMUNITY): Payer: Medicaid Other

## 2013-08-07 DIAGNOSIS — A499 Bacterial infection, unspecified: Secondary | ICD-10-CM | POA: Insufficient documentation

## 2013-08-07 DIAGNOSIS — O2390 Unspecified genitourinary tract infection in pregnancy, unspecified trimester: Secondary | ICD-10-CM | POA: Insufficient documentation

## 2013-08-07 DIAGNOSIS — R109 Unspecified abdominal pain: Secondary | ICD-10-CM

## 2013-08-07 DIAGNOSIS — O210 Mild hyperemesis gravidarum: Secondary | ICD-10-CM | POA: Insufficient documentation

## 2013-08-07 DIAGNOSIS — O9989 Other specified diseases and conditions complicating pregnancy, childbirth and the puerperium: Secondary | ICD-10-CM

## 2013-08-07 DIAGNOSIS — O208 Other hemorrhage in early pregnancy: Secondary | ICD-10-CM | POA: Insufficient documentation

## 2013-08-07 DIAGNOSIS — O26899 Other specified pregnancy related conditions, unspecified trimester: Secondary | ICD-10-CM

## 2013-08-07 DIAGNOSIS — Z87891 Personal history of nicotine dependence: Secondary | ICD-10-CM | POA: Insufficient documentation

## 2013-08-07 DIAGNOSIS — N760 Acute vaginitis: Secondary | ICD-10-CM | POA: Insufficient documentation

## 2013-08-07 DIAGNOSIS — O219 Vomiting of pregnancy, unspecified: Secondary | ICD-10-CM

## 2013-08-07 DIAGNOSIS — B9689 Other specified bacterial agents as the cause of diseases classified elsewhere: Secondary | ICD-10-CM | POA: Insufficient documentation

## 2013-08-07 LAB — URINALYSIS, ROUTINE W REFLEX MICROSCOPIC
Bilirubin Urine: NEGATIVE
Glucose, UA: NEGATIVE mg/dL
Hgb urine dipstick: NEGATIVE
KETONES UR: NEGATIVE mg/dL
LEUKOCYTES UA: NEGATIVE
NITRITE: NEGATIVE
Protein, ur: NEGATIVE mg/dL
Specific Gravity, Urine: 1.01 (ref 1.005–1.030)
UROBILINOGEN UA: 0.2 mg/dL (ref 0.0–1.0)
pH: 7 (ref 5.0–8.0)

## 2013-08-07 LAB — CBC WITH DIFFERENTIAL/PLATELET
BASOS ABS: 0 10*3/uL (ref 0.0–0.1)
BASOS PCT: 0 % (ref 0–1)
EOS PCT: 1 % (ref 0–5)
Eosinophils Absolute: 0.1 10*3/uL (ref 0.0–0.7)
HEMATOCRIT: 30.6 % — AB (ref 36.0–46.0)
Hemoglobin: 10.5 g/dL — ABNORMAL LOW (ref 12.0–15.0)
LYMPHS PCT: 36 % (ref 12–46)
Lymphs Abs: 2 10*3/uL (ref 0.7–4.0)
MCH: 28.2 pg (ref 26.0–34.0)
MCHC: 34.3 g/dL (ref 30.0–36.0)
MCV: 82 fL (ref 78.0–100.0)
MONO ABS: 0.4 10*3/uL (ref 0.1–1.0)
Monocytes Relative: 8 % (ref 3–12)
Neutro Abs: 3.2 10*3/uL (ref 1.7–7.7)
Neutrophils Relative %: 55 % (ref 43–77)
Platelets: 210 10*3/uL (ref 150–400)
RBC: 3.73 MIL/uL — ABNORMAL LOW (ref 3.87–5.11)
RDW: 13.8 % (ref 11.5–15.5)
WBC: 5.7 10*3/uL (ref 4.0–10.5)

## 2013-08-07 LAB — HCG, QUANTITATIVE, PREGNANCY: hCG, Beta Chain, Quant, S: 20737 m[IU]/mL — ABNORMAL HIGH (ref <5)

## 2013-08-07 LAB — WET PREP, GENITAL
Trich, Wet Prep: NONE SEEN
Yeast Wet Prep HPF POC: NONE SEEN

## 2013-08-07 IMAGING — US US OB COMP LESS 14 WK
1 series · 14 of 28 positions shown · non-contrast
Comparison: None.

CLINICAL DATA: Pelvic pain.  Early pregnancy.

EXAM:
OBSTETRIC <14 WK US AND TRANSVAGINAL OB US
TECHNIQUE: Both transabdominal and transvaginal ultrasound examinations were
performed for complete evaluation of the gestation as well as the
maternal uterus, adnexal regions, and pelvic cul-de-sac.
Transvaginal technique was performed to assess early pregnancy.

[Series 1: us ob comp less 14 wks · 59 acquisitions, 14 frames shown]
[im 3/59]
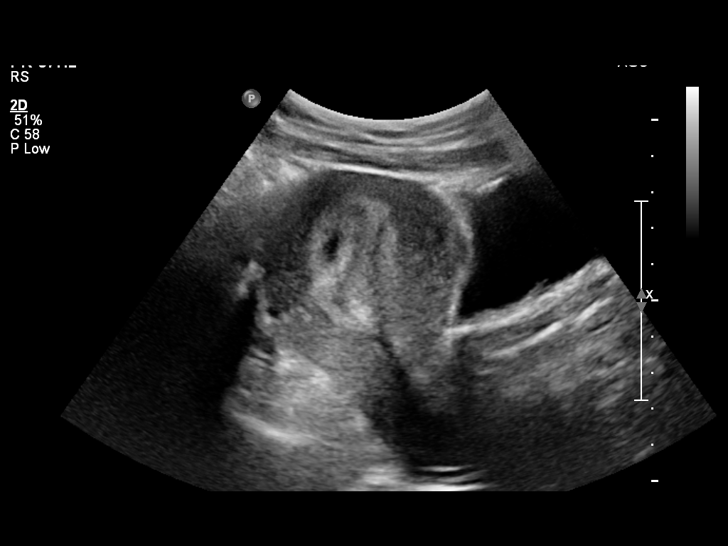
[im 7/59]
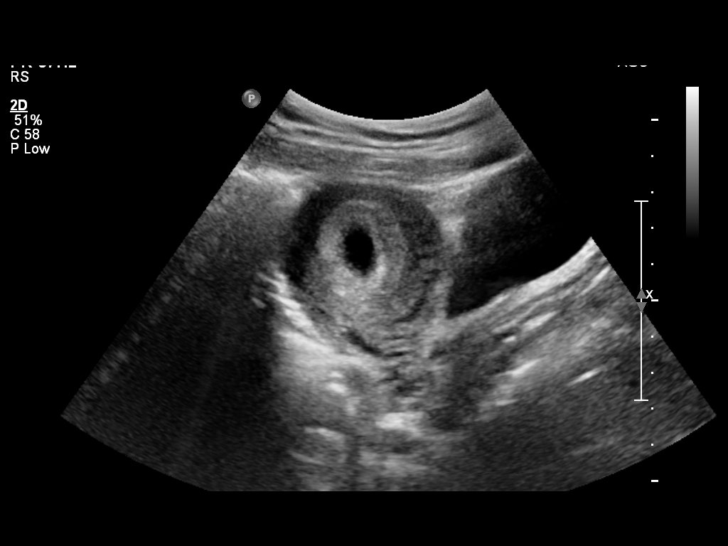
[im 11/59]
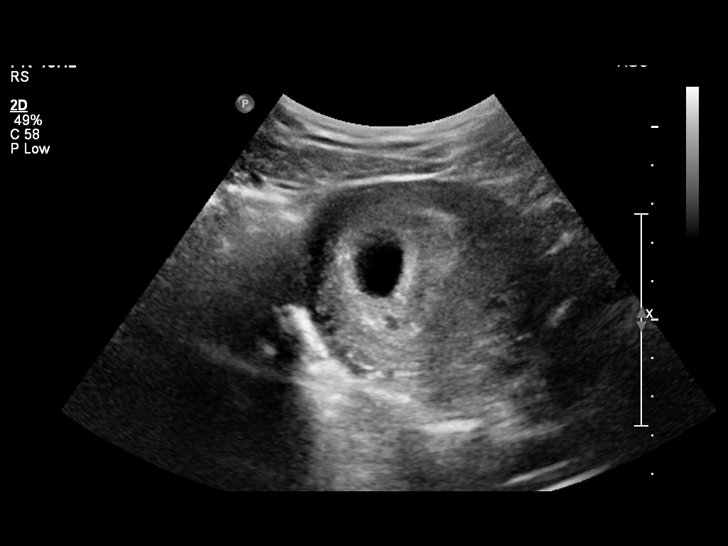
[im 16/59]
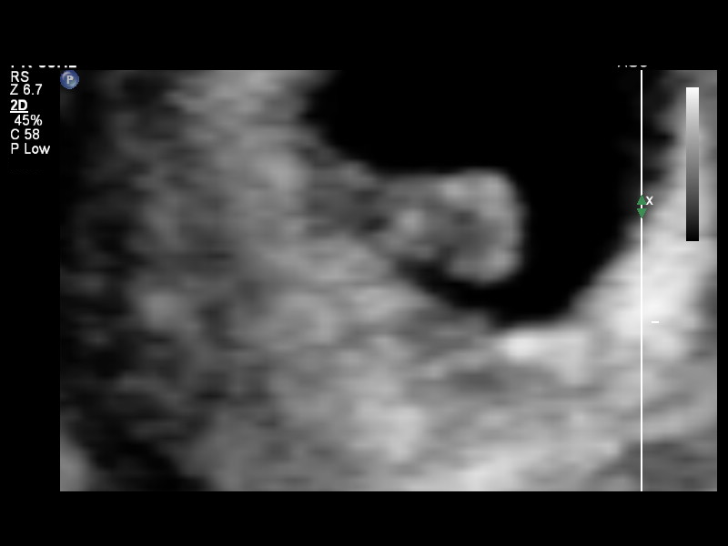
[im 20/59]
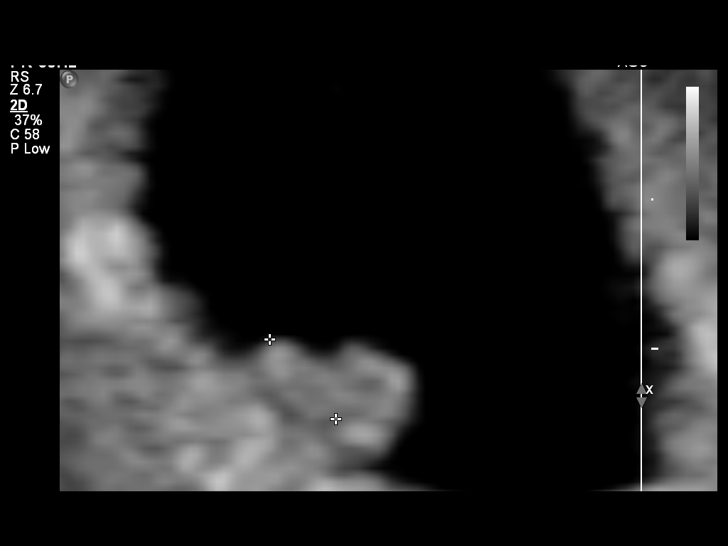
[im 24/59]
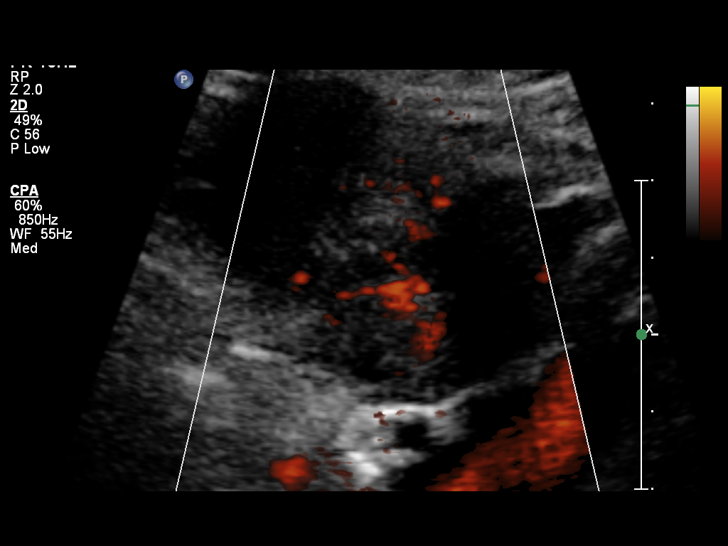
[im 28/59]
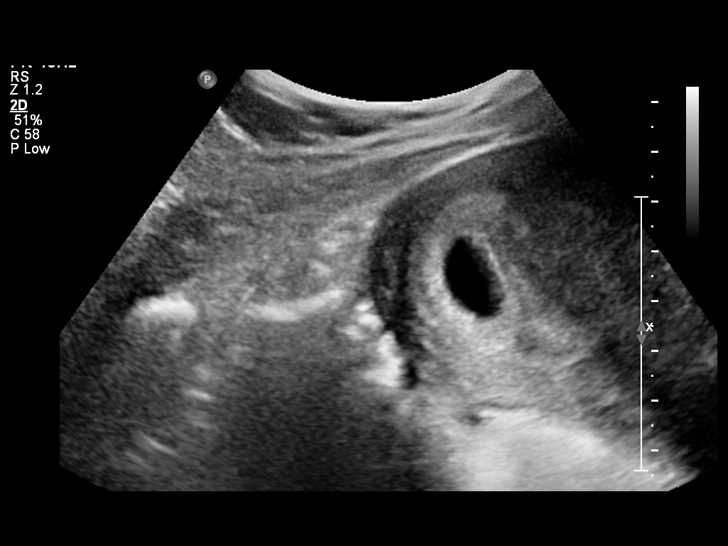
[im 33/59]
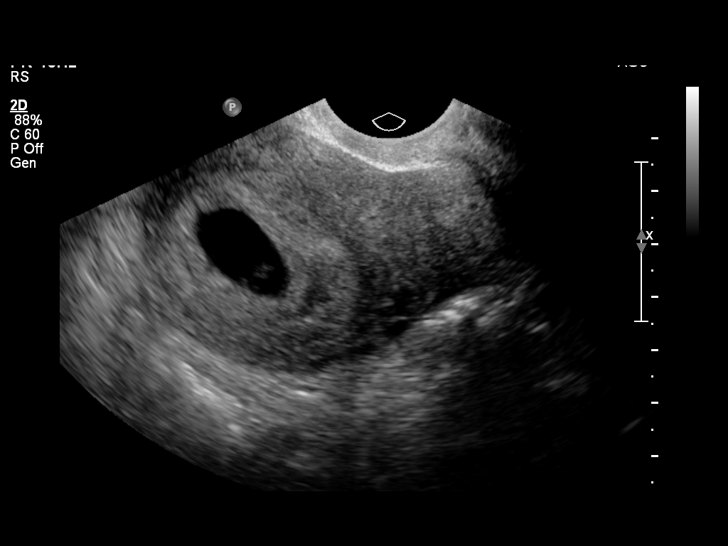
[im 37/59]
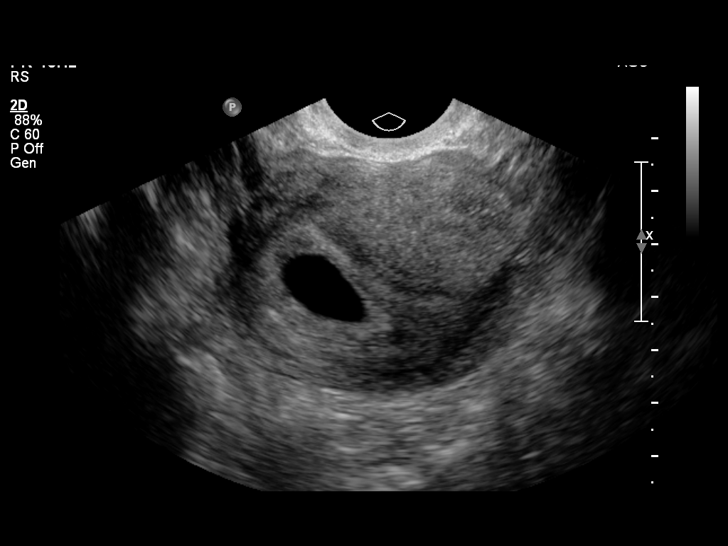
[im 41/59]
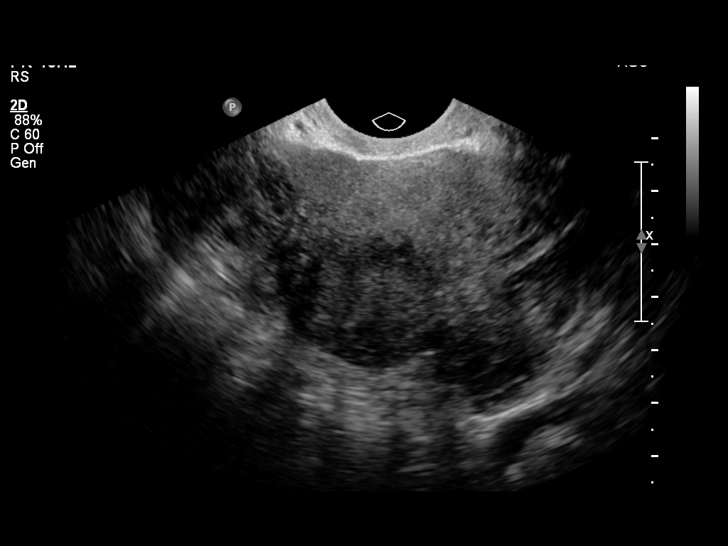
[im 46/59]
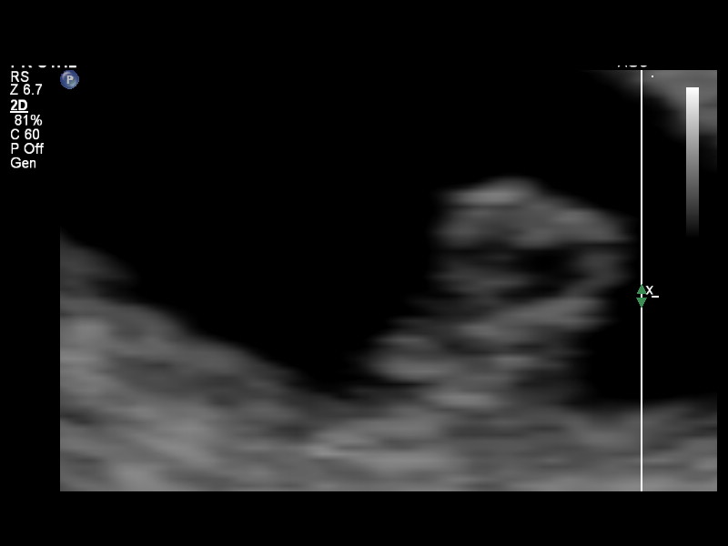
[im 50/59]
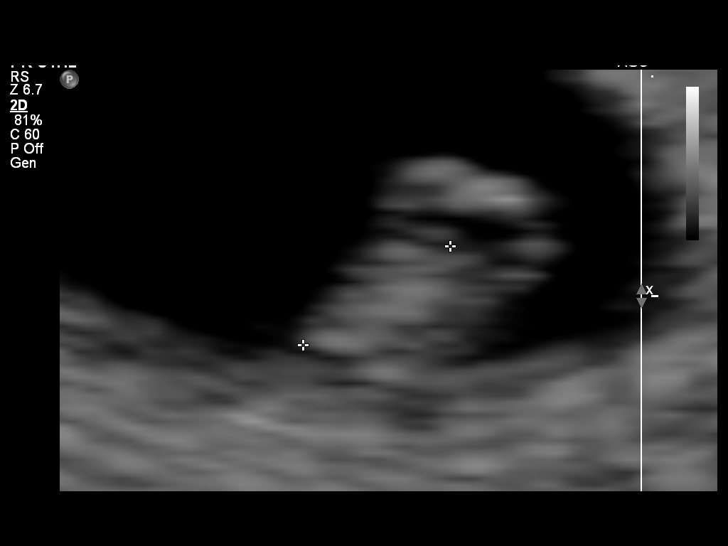
[im 54/59]
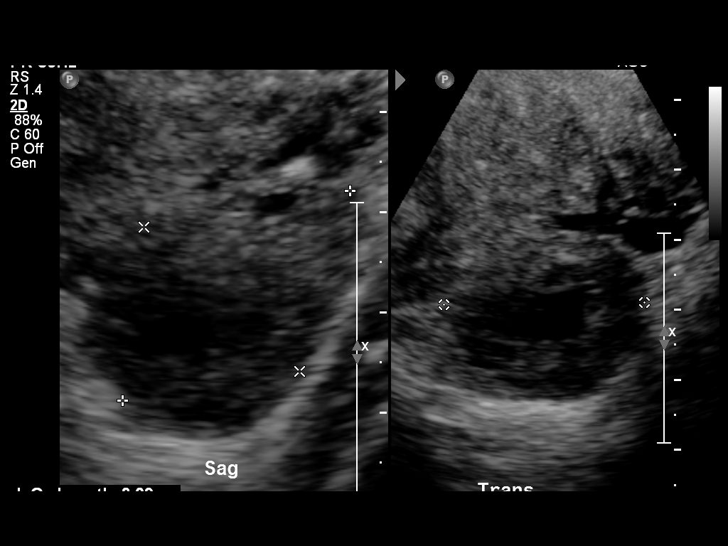
[im 59/59]
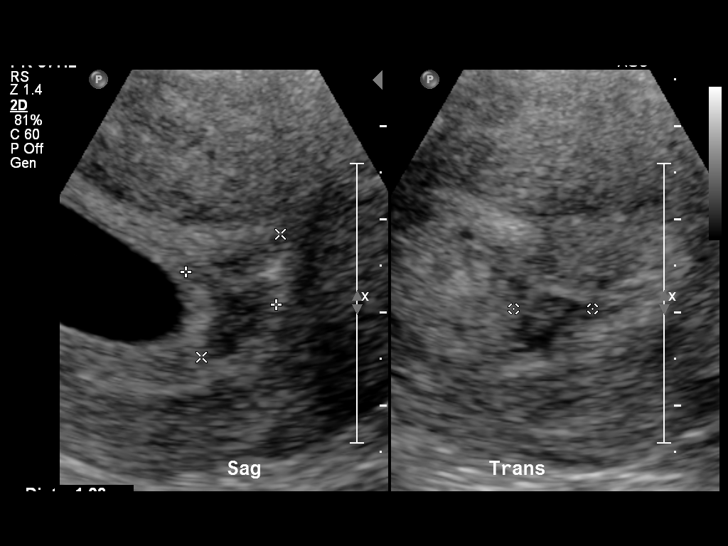

[14 of 28 positions shown; findings below may reference images not displayed]

FINDINGS: Intrauterine gestational sac: Visualized/normal in shape.

Yolk sac:  Present

Embryo:  Present

Cardiac Activity: Present

Heart Rate:  126 bpm

CRL:   3.7  mm   6 w 1 d                  US EDC: [DATE]

Maternal uterus/adnexae:

Small subchorionic hemorrhage (1.0 x 1.6 x 0.8 cm).

Normal right ovary.

Normal left ovary.  Corpus luteum cyst noted.

No free pelvic fluid.
IMPRESSION: 1. Single living intrauterine embryo estimated at 6 weeks and 1 days
gestation.
2. Small subchorionic hemorrhage.
3. Normal ovaries.

## 2013-08-07 MED ORDER — PROMETHAZINE HCL 25 MG PO TABS: 25.0000 mg | ORAL_TABLET | Freq: Four times a day (QID) | ORAL | Status: DC | PRN

## 2013-08-07 MED ORDER — PROMETHAZINE HCL 25 MG PO TABS
12.5000 mg | ORAL_TABLET | Freq: Once | ORAL | Status: AC
  Administered 2013-08-07: 12.5 mg via ORAL
  Filled 2013-08-07: qty 1

## 2013-08-07 MED ORDER — HYDROMORPHONE HCL PF 1 MG/ML IJ SOLN
1.0000 mg | Freq: Once | INTRAMUSCULAR | Status: AC
  Administered 2013-08-07: 1 mg via INTRAMUSCULAR
  Filled 2013-08-07: qty 1

## 2013-08-07 MED ORDER — METRONIDAZOLE 0.75 % VA GEL: 1.0000 | Freq: Two times a day (BID) | VAGINAL | Status: DC

## 2013-08-07 MED ORDER — PROMETHAZINE HCL 25 MG/ML IJ SOLN
25.0000 mg | Freq: Once | INTRAVENOUS | Status: AC
  Administered 2013-08-07: 25 mg via INTRAVENOUS
  Filled 2013-08-07: qty 1

## 2013-08-07 NOTE — MAU Note (Signed)
Patient state she has been having left side pain for 2 days. Has been vomiting and unable to eat for 2 days. Has a clear/cloudy vaginal discharge.

## 2013-08-07 NOTE — Discharge Instructions (Signed)
Pregnancy, First Trimester °The first trimester is the first 3 months your baby is growing inside you. It is important to follow your doctor's instructions. °HOME CARE  °· Do not smoke. °· Do not drink alcohol. °· Only take medicine as told by your doctor. °· Exercise. °· Eat healthy foods. Eat regular, well-balanced meals. °· You can have sex (intercourse) if there are no other problems with the pregnancy. °· Things that help with morning sickness: °¨ Eat soda crackers before getting up in the morning. °¨ Eat 4 to 5 small meals rather than 3 large meals. °¨ Drink liquids between meals, not during meals. °· Go to all appointments as told. °· Take all vitamins or supplements as told by your doctor. °GET HELP RIGHT AWAY IF:  °· You develop a fever. °· You have a bad smelling fluid that is leaking from your vagina. °· There is bleeding from the vagina. °· You develop severe belly (abdominal) or back pain. °· You throw up (vomit) blood. It may look like coffee grounds. °· You lose more than 2 pounds in a week. °· You gain 5 pounds or more in a week. °· You gain more than 2 pounds in a week and you see puffiness (swelling) in your feet, ankles, or legs. °· You have severe dizziness or pass out (faint). °· You are around people who have German measles, chickenpox, or fifth disease. °· You have a headache, watery poop (diarrhea), pain with peeing (urinating), or cannot breath right. °Document Released: 07/15/2007 Document Revised: 04/20/2011 Document Reviewed: 12/06/2012 °ExitCare® Patient Information ©2015 [employer]. This information is not intended to replace advice given to you by your health care provider. Make sure you discuss any questions you have with your health care provider. ° °

## 2013-08-07 NOTE — MAU Provider Note (Signed)
History     CSN: [id number]  Arrival date and time: 08/07/13 1000   None     Chief Complaint  Patient presents with  . Abdominal Pain  . Emesis During Pregnancy  . Vaginal Discharge   HPI Ms. Catherine Simmons is a 28 y.o. G3P2 at 6338w5d who presents to MAU today with complaint of midline suprapubic pain x 2 days. She is having a small amount of clear discharge with mild odor. She denies vaginal bleeding. She states N/V since last night. She denies fever, UTI symptoms, diarrhea, constipation or sick contacts.   OB History   Grav Para Term Preterm Abortions TAB SAB Ect Mult Living   3 2        2       Past Medical History  Diagnosis Date  . Cholecystitis 07/16/10  . Hypertension   . HIV (human immunodeficiency virus infection)     Past Surgical History  Procedure Laterality Date  . Cholecystectomy    . Wisdom tooth extraction      Family History  Problem Relation Age of Onset  . Cancer Mother   . Diabetes Maternal Aunt     History  Substance Use Topics  . Smoking status: Former Smoker    Start date: 03/11/2012    Quit date: 08/24/2012  . Smokeless tobacco: Never Used  . Alcohol Use: No    Allergies:  Allergies  Allergen Reactions  . Stadol [Butorphanol Tartrate] Itching    No prescriptions prior to admission    Review of Systems  Constitutional: Negative for fever and malaise/fatigue.  Gastrointestinal: Positive for nausea, vomiting and abdominal pain. Negative for diarrhea and constipation.  Genitourinary: Negative for dysuria, urgency and frequency.       + vaginal discharge Neg - vaginal bleeding   Physical Exam   Blood pressure 114/59, pulse 53, temperature 98.2 F (36.8 C), temperature source Oral, resp. rate 18, height 5\' 2"  (1.575 m), last menstrual period 06/21/2013, SpO2 100.00%.  Physical Exam  Constitutional: She is oriented to person, place, and time. She appears well-developed and well-nourished. No distress.  HENT:  Head:  Normocephalic.  Cardiovascular: Normal rate.   Respiratory: Effort normal.  GI: Soft. Bowel sounds are normal. She exhibits no distension and no mass. There is tenderness (mild tenderness to palpation of the suprapubic region at midline). There is no rebound and no guarding.  Genitourinary: Uterus is not enlarged and not tender. Cervix exhibits no motion tenderness, no discharge and no friability. Right adnexum displays no mass and no tenderness. Left adnexum displays no mass and no tenderness. No bleeding around the vagina. Vaginal discharge (moderate amount of thin, white, malodorous discharge noted) found.  Neurological: She is alert and oriented to person, place, and time.  Skin: Skin is warm and dry. No erythema.  Psychiatric: She has a normal mood and affect.   Results for orders placed during the hospital encounter of 08/07/13 (from the past 24 hour(s))  URINALYSIS, ROUTINE W REFLEX MICROSCOPIC     Status: None   Collection Time    08/07/13 10:30 AM      Result Value Ref Range   Color, Urine YELLOW  YELLOW   APPearance CLEAR  CLEAR   Specific Gravity, Urine 1.010  1.005 - 1.030   pH 7.0  5.0 - 8.0   Glucose, UA NEGATIVE  NEGATIVE mg/dL   Hgb urine dipstick NEGATIVE  NEGATIVE   Bilirubin Urine NEGATIVE  NEGATIVE   Ketones, ur NEGATIVE  NEGATIVE mg/dL  Protein, ur NEGATIVE  NEGATIVE mg/dL   Urobilinogen, UA 0.2  0.0 - 1.0 mg/dL   Nitrite NEGATIVE  NEGATIVE   Leukocytes, UA NEGATIVE  NEGATIVE  HCG, QUANTITATIVE, PREGNANCY     Status: Abnormal   Collection Time    08/07/13 10:48 AM      Result Value Ref Range   hCG, Beta Francene FindersChain, Quant, S [address] (*) <5 mIU/mL  CBC WITH DIFFERENTIAL     Status: Abnormal   Collection Time    08/07/13 10:48 AM      Result Value Ref Range   WBC 5.7  4.0 - 10.5 K/uL   RBC 3.73 (*) 3.87 - 5.11 MIL/uL   Hemoglobin 10.5 (*) 12.0 - 15.0 g/dL   HCT 16.130.6 (*) 09.636.0 - 04.546.0 %   MCV 82.0  78.0 - 100.0 fL   MCH 28.2  26.0 - 34.0 pg   MCHC 34.3  30.0 - 36.0  g/dL   RDW 40.913.8  81.111.5 - 91.415.5 %   Platelets 210  150 - 400 K/uL   Neutrophils Relative % 55  43 - 77 %   Neutro Abs 3.2  1.7 - 7.7 K/uL   Lymphocytes Relative 36  12 - 46 %   Lymphs Abs 2.0  0.7 - 4.0 K/uL   Monocytes Relative 8  3 - 12 %   Monocytes Absolute 0.4  0.1 - 1.0 K/uL   Eosinophils Relative 1  0 - 5 %   Eosinophils Absolute 0.1  0.0 - 0.7 K/uL   Basophils Relative 0  0 - 1 %   Basophils Absolute 0.0  0.0 - 0.1 K/uL  WET PREP, GENITAL     Status: Abnormal   Collection Time    08/07/13  3:43 PM      Result Value Ref Range   Yeast Wet Prep HPF POC NONE SEEN  NONE SEEN   Trich, Wet Prep NONE SEEN  NONE SEEN   Clue Cells Wet Prep HPF POC MANY (*) NONE SEEN   WBC, Wet Prep HPF POC FEW (*) NONE SEEN   [place] Ob Comp Less 14 Wks  08/07/2013   CLINICAL DATA:  Pelvic pain.  Early pregnancy.  EXAM: OBSTETRIC <14 WK US AND TRANSVAGINAL OB US  TECHNIQUE: Both transabdominal and transvaginal ultrasound examinations were performed for complete evaluation of the gestation as well as the maternal uterus, adnexal regions, and pelvic cul-de-sac. Transvaginal technique was performed to assess early pregnancy.  COMPARISON:  None.  FINDINGS: Intrauterine gestational sac: Visualized/normal in shape.  Yolk sac:  Present  Embryo:  Present  Cardiac Activity: Present  Heart Rate:  126 bpm  CRL:   3.7  mm   6 w 1 d                  US EDC: 04/01/2014  Maternal uterus/adnexae:  Small subchorionic hemorrhage (1.0 x 1.6 x 0.8 cm).  Normal right ovary.  Normal left ovary.  Corpus luteum cyst noted.  No free pelvic fluid.  IMPRESSION: 1. Single living intrauterine embryo estimated at 6 weeks and 1 days gestation. 2. Small subchorionic hemorrhage. 3. Normal ovaries.   Electronically Signed   By: Loralie ChampagneMark  Gallerani M.D.   On: 08/07/2013 12:20   [place] Ob Transvaginal  08/07/2013   CLINICAL DATA:  Pelvic pain.  Early pregnancy.  EXAM: OBSTETRIC <14 WK US AND TRANSVAGINAL OB US  TECHNIQUE: Both transabdominal and transvaginal  ultrasound examinations were performed for complete evaluation of the gestation as well  as the maternal uterus, adnexal regions, and pelvic cul-de-sac. Transvaginal technique was performed to assess early pregnancy.  COMPARISON:  None.  FINDINGS: Intrauterine gestational sac: Visualized/normal in shape.  Yolk sac:  Present  Embryo:  Present  Cardiac Activity: Present  Heart Rate:  126 bpm  CRL:   3.7  mm   6 w 1 d                  [place] EDC: 04/01/2014  Maternal uterus/adnexae:  Small subchorionic hemorrhage (1.0 x 1.6 x 0.8 cm).  Normal right ovary.  Normal left ovary.  Corpus luteum cyst noted.  No free pelvic fluid.  IMPRESSION: 1. Single living intrauterine embryo estimated at 6 weeks and 1 days gestation. 2. Small subchorionic hemorrhage. 3. Normal ovaries.   Electronically Signed   By: Loralie Champagne M.D.   On: 08/07/2013 12:20     MAU Course  Procedures None  MDM Patient appears uncomfortable. 1 mg Dilaudid IM given Patient is actively vomiting. 12.5 mg Phenergan tab given Patient continues to have active vomiting in MAU. 1 liter IV LR with 25 mg Phenergan infusion given Patient reports resolution of nausea and minimal abdominal pain prior to discharge Assessment and Plan  A: SIUP at [redacted]w[redacted]d with normal cardiac activity Small subchorionic hemorrhage Nausea and vomiting in pregnancy prior to [redacted] weeks gestation Bacterial vaginosis  P: Discharge home Rx for Metrogel and Phenergan sent to patient's pharmacy Patient referred to [facility] clinic for prenatal care in [facility]. They will call patient with an appointment First trimester warning signs discussed Patient may return to MAU as needed or if her condition were to change or worsen   Freddi Starr, PA-C  08/07/2013, 4:35 PM

## 2013-08-08 ENCOUNTER — Encounter: Payer: Self-pay | Admitting: Obstetrics & Gynecology

## 2013-08-08 LAB — GC/CHLAMYDIA PROBE AMP
CT Probe RNA: NEGATIVE
GC PROBE AMP APTIMA: NEGATIVE

## 2013-08-10 ENCOUNTER — Encounter (HOSPITAL_COMMUNITY): Payer: Self-pay

## 2013-08-10 ENCOUNTER — Inpatient Hospital Stay (HOSPITAL_COMMUNITY)
Admission: AD | Admit: 2013-08-10 | Discharge: 2013-08-11 | Disposition: A | Discharge status: Home / Self Care | Payer: Medicaid Other | Source: Ambulatory Visit | Attending: Obstetrics & Gynecology | Admitting: Obstetrics & Gynecology

## 2013-08-10 DIAGNOSIS — O210 Mild hyperemesis gravidarum: Secondary | ICD-10-CM | POA: Insufficient documentation

## 2013-08-10 DIAGNOSIS — R109 Unspecified abdominal pain: Secondary | ICD-10-CM | POA: Diagnosis not present

## 2013-08-10 DIAGNOSIS — R42 Dizziness and giddiness: Secondary | ICD-10-CM | POA: Diagnosis not present

## 2013-08-10 DIAGNOSIS — O98519 Other viral diseases complicating pregnancy, unspecified trimester: Secondary | ICD-10-CM | POA: Insufficient documentation

## 2013-08-10 DIAGNOSIS — Z21 Asymptomatic human immunodeficiency virus [HIV] infection status: Secondary | ICD-10-CM | POA: Insufficient documentation

## 2013-08-10 DIAGNOSIS — O219 Vomiting of pregnancy, unspecified: Secondary | ICD-10-CM

## 2013-08-10 DIAGNOSIS — Z87891 Personal history of nicotine dependence: Secondary | ICD-10-CM | POA: Insufficient documentation

## 2013-08-10 DIAGNOSIS — O10019 Pre-existing essential hypertension complicating pregnancy, unspecified trimester: Secondary | ICD-10-CM | POA: Diagnosis not present

## 2013-08-10 LAB — URINALYSIS, ROUTINE W REFLEX MICROSCOPIC
Bilirubin Urine: NEGATIVE
GLUCOSE, UA: NEGATIVE mg/dL
Hgb urine dipstick: NEGATIVE
Ketones, ur: 40 mg/dL — AB
LEUKOCYTES UA: NEGATIVE
Nitrite: NEGATIVE
Protein, ur: NEGATIVE mg/dL
Specific Gravity, Urine: 1.02 (ref 1.005–1.030)
Urobilinogen, UA: 0.2 mg/dL (ref 0.0–1.0)
pH: 6 (ref 5.0–8.0)

## 2013-08-10 LAB — COMPREHENSIVE METABOLIC PANEL
ALBUMIN: 3.5 g/dL (ref 3.5–5.2)
ALT: 57 U/L — AB (ref 0–35)
AST: 27 U/L (ref 0–37)
Alkaline Phosphatase: 52 U/L (ref 39–117)
Anion gap: 13 (ref 5–15)
BILIRUBIN TOTAL: 0.5 mg/dL (ref 0.3–1.2)
BUN: 9 mg/dL (ref 6–23)
CO2: 21 meq/L (ref 19–32)
Calcium: 9.1 mg/dL (ref 8.4–10.5)
Chloride: 102 mEq/L (ref 96–112)
Creatinine, Ser: 0.54 mg/dL (ref 0.50–1.10)
GFR calc Af Amer: >90 mL/min (ref >90)
GFR calc non Af Amer: >90 mL/min (ref >90)
Glucose, Bld: 83 mg/dL (ref 70–99)
POTASSIUM: 4 meq/L (ref 3.7–5.3)
SODIUM: 136 meq/L — AB (ref 137–147)
Total Protein: 6.7 g/dL (ref 6.0–8.3)

## 2013-08-10 LAB — CBC WITH DIFFERENTIAL/PLATELET
BASOS ABS: 0 10*3/uL (ref 0.0–0.1)
Basophils Relative: 0 % (ref 0–1)
Eosinophils Absolute: 0.1 10*3/uL (ref 0.0–0.7)
Eosinophils Relative: 1 % (ref 0–5)
HEMATOCRIT: 30.7 % — AB (ref 36.0–46.0)
Hemoglobin: 10.8 g/dL — ABNORMAL LOW (ref 12.0–15.0)
LYMPHS PCT: 36 % (ref 12–46)
Lymphs Abs: 2.4 10*3/uL (ref 0.7–4.0)
MCH: 28.3 pg (ref 26.0–34.0)
MCHC: 35.2 g/dL (ref 30.0–36.0)
MCV: 80.6 fL (ref 78.0–100.0)
MONO ABS: 0.6 10*3/uL (ref 0.1–1.0)
Monocytes Relative: 9 % (ref 3–12)
Neutro Abs: 3.7 10*3/uL (ref 1.7–7.7)
Neutrophils Relative %: 55 % (ref 43–77)
PLATELETS: 219 10*3/uL (ref 150–400)
RBC: 3.81 MIL/uL — ABNORMAL LOW (ref 3.87–5.11)
RDW: 13.1 % (ref 11.5–15.5)
WBC: 6.8 10*3/uL (ref 4.0–10.5)

## 2013-08-10 MED ORDER — PROMETHAZINE HCL 25 MG/ML IJ SOLN
25.0000 mg | Freq: Once | INTRAVENOUS | Status: AC
  Administered 2013-08-10: 25 mg via INTRAVENOUS
  Filled 2013-08-10: qty 1

## 2013-08-10 MED ORDER — ONDANSETRON 8 MG/NS 50 ML IVPB
8.0000 mg | Freq: Once | INTRAVENOUS | Status: AC
  Administered 2013-08-11: 8 mg via INTRAVENOUS
  Filled 2013-08-10: qty 8

## 2013-08-10 MED ORDER — LACTATED RINGERS IV SOLN
Freq: Once | INTRAVENOUS | Status: AC
  Administered 2013-08-11: via INTRAVENOUS

## 2013-08-10 NOTE — MAU Note (Signed)
Nausea & vomiting worse since last week; vomited over 20 times in last 24 hours. Chills since yesterday, denies fever at home. Denies vaginal bleeding. Right sided abdominal pain since being brought back into MAU.

## 2013-08-10 NOTE — MAU Provider Note (Signed)
History     CSN: [id number]  Arrival date and time: 08/10/13 2012   First Provider Initiated Contact with Patient 08/10/13 2217      Chief Complaint  Patient presents with  . Emesis During Pregnancy  . Dizziness   HPI Ms. Catherine Simmons is a 28 y.o. G3P2002 at 6263w2d who presents to MAU today with complaint of N/V. The patient was seen on 08/07/13 with the same issue and given Phenergan. She states that she has been taking that without relief. She has had N/V daily and today has not been able to keep anything down. She is also having upper abdominal pain and dizziness today. She denies fever or vaginal bleeding.   OB History   Grav Para Term Preterm Abortions TAB SAB Ect Mult Living   3 2 2       2       Past Medical History  Diagnosis Date  . Cholecystitis 07/16/10  . Hypertension   . HIV (human immunodeficiency virus infection)     Past Surgical History  Procedure Laterality Date  . Cholecystectomy    . Wisdom tooth extraction      Family History  Problem Relation Age of Onset  . Cancer Mother   . Diabetes Maternal Aunt     History  Substance Use Topics  . Smoking status: Former Smoker    Start date: 03/11/2012    Quit date: 08/24/2012  . Smokeless tobacco: Never Used  . Alcohol Use: No    Allergies:  Allergies  Allergen Reactions  . Stadol [Butorphanol Tartrate] Itching    Prescriptions prior to admission  Medication Sig Dispense Refill  . acetaminophen (TYLENOL) 500 MG tablet Take 1,000 mg by mouth every 6 (six) hours as needed. Stomach pains      . amLODipine (NORVASC) 10 MG tablet TAKE 1 TABLET BY MOUTH DAILY  30 tablet  5  . atazanavir (REYATAZ) 300 MG capsule Take 1 capsule (300 mg total) by mouth daily with breakfast.  30 capsule  11  . emtricitabine-tenofovir (TRUVADA) 200-300 MG per tablet Take 1 tablet by mouth daily.  30 tablet  11  . metroNIDAZOLE (METROGEL VAGINAL) 0.75 % vaginal gel Place 1 Applicatorful vaginally 2 (two) times daily.  70 g   0  . promethazine (PHENERGAN) 25 MG tablet Take 1 tablet (25 mg total) by mouth every 6 (six) hours as needed for nausea or vomiting.  30 tablet  0  . ritonavir (NORVIR) 100 MG TABS tablet Take 1 tablet (100 mg total) by mouth daily.  30 tablet  11    Review of Systems  Constitutional: Positive for malaise/fatigue. Negative for fever.  Gastrointestinal: Positive for nausea, vomiting, abdominal pain and diarrhea. Negative for constipation.  Genitourinary:       Neg - vaginal bleeding, discharge  Neurological: Positive for dizziness and weakness. Negative for loss of consciousness.   Physical Exam   Blood pressure 138/86, pulse 80, temperature 98.3 F (36.8 C), temperature source Oral, resp. rate 20, height 5\' 2"  (1.575 m), weight 213 lb (96.616 kg), last menstrual period 06/21/2013, SpO2 100.00%.  Physical Exam  Constitutional: She is oriented to person, place, and time. She appears well-developed and well-nourished.  Appears uncomfortable  HENT:  Head: Normocephalic.  Cardiovascular: Normal rate.   Respiratory: Effort normal.  GI: Soft. She exhibits no distension and no mass. There is tenderness (tenderness to palpation of the upper abodmen and mild in the lower abdomen at midline). There is no rebound and  no guarding.  Neurological: She is alert and oriented to person, place, and time.  Skin: Skin is warm and dry. No erythema.  Psychiatric: She has a normal mood and affect.   Results for orders placed during the hospital encounter of 08/10/13 (from the past 24 hour(s))  URINALYSIS, ROUTINE W REFLEX MICROSCOPIC     Status: Abnormal   Collection Time    08/10/13 10:00 PM      Result Value Ref Range   Color, Urine YELLOW  YELLOW   APPearance CLEAR  CLEAR   Specific Gravity, Urine 1.020  1.005 - 1.030   pH 6.0  5.0 - 8.0   Glucose, UA NEGATIVE  NEGATIVE mg/dL   Hgb urine dipstick NEGATIVE  NEGATIVE   Bilirubin Urine NEGATIVE  NEGATIVE   Ketones, ur 40 (*) NEGATIVE mg/dL    Protein, ur NEGATIVE  NEGATIVE mg/dL   Urobilinogen, UA 0.2  0.0 - 1.0 mg/dL   Nitrite NEGATIVE  NEGATIVE   Leukocytes, UA NEGATIVE  NEGATIVE  CBC WITH DIFFERENTIAL     Status: Abnormal   Collection Time    08/10/13 10:56 PM      Result Value Ref Range   WBC 6.8  4.0 - 10.5 K/uL   RBC 3.81 (*) 3.87 - 5.11 MIL/uL   Hemoglobin 10.8 (*) 12.0 - 15.0 g/dL   HCT 21.330.7 (*) 08.636.0 - 57.846.0 %   MCV 80.6  78.0 - 100.0 fL   MCH 28.3  26.0 - 34.0 pg   MCHC 35.2  30.0 - 36.0 g/dL   RDW 46.913.1  62.911.5 - 52.815.5 %   Platelets 219  150 - 400 K/uL   Neutrophils Relative % 55  43 - 77 %   Neutro Abs 3.7  1.7 - 7.7 K/uL   Lymphocytes Relative 36  12 - 46 %   Lymphs Abs 2.4  0.7 - 4.0 K/uL   Monocytes Relative 9  3 - 12 %   Monocytes Absolute 0.6  0.1 - 1.0 K/uL   Eosinophils Relative 1  0 - 5 %   Eosinophils Absolute 0.1  0.0 - 0.7 K/uL   Basophils Relative 0  0 - 1 %   Basophils Absolute 0.0  0.0 - 0.1 K/uL  COMPREHENSIVE METABOLIC PANEL     Status: Abnormal   Collection Time    08/10/13 10:56 PM      Result Value Ref Range   Sodium 136 (*) 137 - 147 mEq/L   Potassium 4.0  3.7 - 5.3 mEq/L   Chloride 102  96 - 112 mEq/L   CO2 21  19 - 32 mEq/L   Glucose, Bld 83  70 - 99 mg/dL   BUN 9  6 - 23 mg/dL   Creatinine, Ser 4.130.54  0.50 - 1.10 mg/dL   Calcium 9.1  8.4 - 24.410.5 mg/dL   Total Protein 6.7  6.0 - 8.3 g/dL   Albumin 3.5  3.5 - 5.2 g/dL   AST 27  0 - 37 U/L   ALT 57 (*) 0 - 35 U/L   Alkaline Phosphatase 52  39 - 117 U/L   Total Bilirubin 0.5  0.3 - 1.2 mg/dL   GFR calc non Af Amer >90  >90 mL/min   GFR calc Af Amer >90  >90 mL/min   Anion gap 13  5 - 15    MAU Course  Procedures None  MDM CBC, CMP today IV LR with 25 mg Phenergan given Patient continues to have active  vomiting in MAU 8 mg Zofran IV given  1 additional liter of LR given ALT is slightly elevated. Amylase and Lipase ordered as add-on labs Patient able to tolerate PO in MAU. No additional episodes of emesis  Assessment and  Plan  A: Nausea and vomiting in pregnancy prior to [redacted] weeks gestation CHTN  P: Discharge home Rx for Reglan given to patient Discussed appropriate food choices Discussed use of TUMs or Pepcid PRN  Patient advised to follow-up with [facility] as scheduled to start prenatal care in [facility] Patient may return to MAU as needed or if her condition were to change or worsen   Freddi Starr, PA-C  08/10/2013, 11:54 PM

## 2013-08-11 DIAGNOSIS — O210 Mild hyperemesis gravidarum: Secondary | ICD-10-CM

## 2013-08-11 LAB — LIPASE, BLOOD: Lipase: 25 U/L (ref 11–59)

## 2013-08-11 LAB — AMYLASE: Amylase: 68 U/L (ref 0–105)

## 2013-08-11 MED ORDER — METOCLOPRAMIDE HCL 10 MG PO TABS: 10.0000 mg | ORAL_TABLET | Freq: Four times a day (QID) | ORAL | Status: DC

## 2013-08-11 NOTE — MAU Provider Note (Signed)
Attestation of Attending Supervision of Advanced Practitioner (PA/CNM/NP): Evaluation and management procedures were performed by the Advanced Practitioner under my supervision and collaboration.  I have reviewed the Advanced Practitioner's note and chart, and I agree with the management and plan.  Makisha Marrin, MD, FACOG Attending Obstetrician & Gynecologist Faculty Practice, [facility]   

## 2013-08-11 NOTE — Discharge Instructions (Signed)
Morning Sickness °Morning sickness is when you feel sick to your stomach (nauseous) during pregnancy. This nauseous feeling may or may not come with vomiting. It often occurs in the morning but can be a problem any time of day. Morning sickness is most common during the first trimester, but it may continue throughout pregnancy. While morning sickness is unpleasant, it is usually harmless unless you develop severe and continual vomiting (hyperemesis gravidarum). This condition requires more intense treatment.  °CAUSES  °The cause of morning sickness is not completely known but seems to be related to normal hormonal changes that occur in pregnancy. °RISK FACTORS °You are at greater risk if you: °· Experienced nausea or vomiting before your pregnancy. °· Had morning sickness during a previous pregnancy. °· Are pregnant with more than one baby, such as twins. °TREATMENT  °Do not use any medicines (prescription, over-the-counter, or herbal) for morning sickness without first talking to your health care provider. Your health care provider may prescribe or recommend: °· Vitamin B6 supplements. °· Anti-nausea medicines. °· The herbal medicine ginger. °HOME CARE INSTRUCTIONS  °· Only take over-the-counter or prescription medicines as directed by your health care provider. °· Taking multivitamins before getting pregnant can prevent or decrease the severity of morning sickness in most women. °· Eat a piece of dry toast or unsalted crackers before getting out of bed in the morning. °· Eat five or six small meals a day. °· Eat dry and bland foods (rice, baked potato). Foods high in carbohydrates are often helpful. °· Do not drink liquids with your meals. Drink liquids between meals. °· Avoid greasy, fatty, and spicy foods. °· Get someone to cook for you if the smell of any food causes nausea and vomiting. °· If you feel nauseous after taking prenatal vitamins, take the vitamins at night or with a snack.  °· Snack on protein  foods (nuts, yogurt, cheese) between meals if you are hungry. °· Eat unsweetened gelatins for desserts. °· Wearing an acupressure wristband (worn for sea sickness) may be helpful. °· Acupuncture may be helpful. °· Do not smoke. °· Get a humidifier to keep the air in your house free of odors. °· Get plenty of fresh air. °SEEK MEDICAL CARE IF:  °· Your home remedies are not working, and you need medicine. °· You feel dizzy or lightheaded. °· You are losing weight. °SEEK IMMEDIATE MEDICAL CARE IF:  °· You have persistent and uncontrolled nausea and vomiting. °· You pass out (faint). °MAKE SURE YOU: °· Understand these instructions. °· Will watch your condition. °· Will get help right away if you are not doing well or get worse. °Document Released: 03/19/2006 Document Revised: 01/31/2013 Document Reviewed: 07/13/2012 °ExitCare® Patient Information ©2015 [employer]. This information is not intended to replace advice given to you by your health care provider. Make sure you discuss any questions you have with your health care provider. ° °Hyperemesis Gravidarum °Hyperemesis gravidarum is a severe form of nausea and vomiting that happens during pregnancy. Hyperemesis is worse than morning sickness. It may cause you to have nausea or vomiting all day for many days. It may keep you from eating and drinking enough food and liquids. Hyperemesis usually occurs during the first half (the first 20 weeks) of pregnancy. It often goes away once a woman is in her second half of pregnancy. However, sometimes hyperemesis continues through an entire pregnancy.  °CAUSES  °The cause of this condition is not completely known but is thought to be related to changes in   the body's hormones when pregnant. It could be from the high level of the pregnancy hormone or an increase in estrogen in the body.  °SIGNS AND SYMPTOMS  °· Severe nausea and vomiting. °· Nausea that does not go away. °· Vomiting that does not allow you to keep any food  down. °· Weight loss and body fluid loss (dehydration). °· Having no desire to eat or not liking food you have previously enjoyed. °DIAGNOSIS  °Your health care provider will do a physical exam and ask you about your symptoms. He or she may also order blood tests and urine tests to make sure something else is not causing the problem.  °TREATMENT  °You may only need medicine to control the problem. If medicines do not control the nausea and vomiting, you will be treated in the hospital to prevent dehydration, increased acid in the blood (acidosis), weight loss, and changes in the electrolytes in your body that may harm the unborn baby (fetus). You may need IV fluids.  °HOME CARE INSTRUCTIONS  °· Only take over-the-counter or prescription medicines as directed by your health care provider. °· Try eating a couple of dry crackers or toast in the morning before getting out of bed. °· Avoid foods and smells that upset your stomach. °· Avoid fatty and spicy foods. °· Eat 5-6 small meals a day. °· Do not drink when eating meals. Drink between meals. °· For snacks, eat high-protein foods, such as cheese. °· Eat or suck on things that have ginger in them. Ginger helps nausea. °· Avoid food preparation. The smell of food can spoil your appetite. °· Avoid iron pills and iron in your multivitamins until after 3-4 months of being pregnant. However, consult with your health care provider before stopping any prescribed iron pills. °SEEK MEDICAL CARE IF:  °· Your abdominal pain increases. °· You have a severe headache. °· You have vision problems. °· You are losing weight. °SEEK IMMEDIATE MEDICAL CARE IF:  °· You are unable to keep fluids down. °· You vomit blood. °· You have constant nausea and vomiting. °· You have excessive weakness. °· You have extreme thirst. °· You have dizziness or fainting. °· You have a fever or persistent symptoms for more than 2-3 days. °· You have a fever and your symptoms suddenly get worse. °MAKE SURE  YOU:  °· Understand these instructions. °· Will watch your condition. °· Will get help right away if you are not doing well or get worse. °Document Released: 01/26/2005 Document Revised: 11/16/2012 Document Reviewed: 09/07/2012 °ExitCare® Patient Information ©2015 [employer]. This information is not intended to replace advice given to you by your health care provider. Make sure you discuss any questions you have with your health care provider. °Eating Plan for Hyperemesis Gravidarum °Severe cases of hyperemesis gravidarum can lead to dehydration and malnutrition. The hyperemesis eating plan is one way to lessen the symptoms of nausea and vomiting. It is often used with prescribed medicines to control your symptoms.  °WHAT CAN I DO TO RELIEVE MY SYMPTOMS? °Listen to your body. Everyone is different and has different preferences. Find what works best for you. Some of the following things may help: °· Eat and drink slowly. °· Eat 5-6 small meals daily instead of 3 large meals.   °· Eat crackers before you get out of bed in the morning.   °· Starchy foods are usually well tolerated (such as cereal, toast, bread, potatoes, pasta, rice, and pretzels).   °· Ginger may help with nausea. Add ¼   tsp ground ginger to hot tea or choose ginger tea.   °· Try drinking 100% fruit juice or an electrolyte drink. °· Continue to take your prenatal vitamins as directed by your health care provider. If you are having trouble taking your prenatal vitamins, talk with your health care provider about different options. °· Include at least 1 serving of protein with your meals and snacks (such as meats or poultry, beans, nuts, eggs, or yogurt). Try eating a protein-rich snack before bed (such as cheese and crackers or a half turkey or peanut butter sandwich). °WHAT THINGS SHOULD I AVOID TO REDUCE MY SYMPTOMS? °The following things may help reduce your symptoms: °· Avoid foods with strong smells. Try eating meals in well-ventilated areas that  are free of odors. °· Avoid drinking water or other beverages with meals. Try not to drink anything less than 30 minutes before and after meals. °· Avoid drinking more than 1 cup of fluid at a time. °· Avoid fried or high-fat foods, such as butter and cream sauces. °· Avoid spicy foods. °· Avoid skipping meals the best you can. Nausea can be more intense on an empty stomach. If you cannot tolerate food at that time, do not force it. Try sucking on ice chips or other frozen items and make up the calories later. °· Avoid lying down within 2 hours after eating. °Document Released: 11/23/2006 Document Revised: 01/31/2013 Document Reviewed: 11/30/2012 °ExitCare® Patient Information ©2015 [employer]. This information is not intended to replace advice given to you by your health care provider. Make sure you discuss any questions you have with your health care provider. ° °

## 2013-08-13 ENCOUNTER — Inpatient Hospital Stay (HOSPITAL_COMMUNITY)
Admission: AD | Admit: 2013-08-13 | Discharge: 2013-08-14 | Disposition: A | Discharge status: Home / Self Care | Payer: Medicaid Other | Source: Ambulatory Visit | Attending: Obstetrics and Gynecology | Admitting: Obstetrics and Gynecology

## 2013-08-13 ENCOUNTER — Encounter (HOSPITAL_COMMUNITY): Payer: Self-pay | Admitting: *Deleted

## 2013-08-13 DIAGNOSIS — F411 Generalized anxiety disorder: Secondary | ICD-10-CM | POA: Insufficient documentation

## 2013-08-13 DIAGNOSIS — Z87891 Personal history of nicotine dependence: Secondary | ICD-10-CM | POA: Diagnosis not present

## 2013-08-13 DIAGNOSIS — O219 Vomiting of pregnancy, unspecified: Secondary | ICD-10-CM

## 2013-08-13 DIAGNOSIS — O98519 Other viral diseases complicating pregnancy, unspecified trimester: Secondary | ICD-10-CM | POA: Insufficient documentation

## 2013-08-13 DIAGNOSIS — O210 Mild hyperemesis gravidarum: Secondary | ICD-10-CM | POA: Diagnosis not present

## 2013-08-13 DIAGNOSIS — O99340 Other mental disorders complicating pregnancy, unspecified trimester: Secondary | ICD-10-CM | POA: Diagnosis not present

## 2013-08-13 DIAGNOSIS — Z21 Asymptomatic human immunodeficiency virus [HIV] infection status: Secondary | ICD-10-CM | POA: Diagnosis not present

## 2013-08-13 LAB — URINALYSIS, ROUTINE W REFLEX MICROSCOPIC
BILIRUBIN URINE: NEGATIVE
Glucose, UA: NEGATIVE mg/dL
HGB URINE DIPSTICK: NEGATIVE
Ketones, ur: >80 mg/dL — AB
Leukocytes, UA: NEGATIVE
Nitrite: NEGATIVE
PH: 6 (ref 5.0–8.0)
Protein, ur: NEGATIVE mg/dL
SPECIFIC GRAVITY, URINE: 1.01 (ref 1.005–1.030)
UROBILINOGEN UA: 1 mg/dL (ref 0.0–1.0)

## 2013-08-13 NOTE — MAU Note (Signed)
Pt states she is feeling "light in my body", states she does not feel dizzy. States she keeps throwing up and she feels like she cannot breathe.

## 2013-08-14 DIAGNOSIS — O210 Mild hyperemesis gravidarum: Secondary | ICD-10-CM

## 2013-08-14 MED ORDER — ONDANSETRON 8 MG PO TBDP
8.0000 mg | ORAL_TABLET | Freq: Once | ORAL | Status: AC
  Administered 2013-08-14: 8 mg via ORAL
  Filled 2013-08-14: qty 1

## 2013-08-14 MED ORDER — ONDANSETRON 8 MG PO TBDP: 8.0000 mg | ORAL_TABLET | Freq: Three times a day (TID) | ORAL | Status: DC | PRN

## 2013-08-14 NOTE — MAU Provider Note (Signed)
History     CSN: [id number]  Arrival date and time: 08/13/13 2247   First Provider Initiated Contact with Patient 08/13/13 2349      Chief Complaint  Patient presents with  . Emesis During Pregnancy   HPI This is a 28 y.o. female at 1970w5d who presents with c/o nausea, vomiting, weakness, and trouble breathing. Very agitated and crying while being interviewed. Tells me different things than she told nurse. States she is taking Phenergan every 6 hrs but told nurse she was out of it. Worried that the HIV is causing the N/V, since she did not have it with other pregnancies. New OB appt is at end of the month. States drinks a lot of water, keeps it down.   OB History   Grav Para Term Preterm Abortions TAB SAB Ect Mult Living   3 2 2       2       Past Medical History  Diagnosis Date  . Cholecystitis 07/16/10  . Hypertension   . HIV (human immunodeficiency virus infection)     Past Surgical History  Procedure Laterality Date  . Cholecystectomy    . Wisdom tooth extraction      Family History  Problem Relation Age of Onset  . Cancer Mother   . Diabetes Maternal Aunt     History  Substance Use Topics  . Smoking status: Former Smoker    Start date: 03/11/2012    Quit date: 08/24/2012  . Smokeless tobacco: Never Used  . Alcohol Use: No    Allergies:  Allergies  Allergen Reactions  . Stadol [Butorphanol Tartrate] Itching    Prescriptions prior to admission  Medication Sig Dispense Refill  . amLODipine (NORVASC) 10 MG tablet TAKE 1 TABLET BY MOUTH DAILY  30 tablet  5  . atazanavir (REYATAZ) 300 MG capsule Take 1 capsule (300 mg total) by mouth daily with breakfast.  30 capsule  11  . emtricitabine-tenofovir (TRUVADA) 200-300 MG per tablet Take 1 tablet by mouth daily.  30 tablet  11  . hydrochlorothiazide (MICROZIDE) 12.5 MG capsule Take 12.5 mg by mouth daily.      . metoCLOPramide (REGLAN) 10 MG tablet Take 1 tablet (10 mg total) by mouth every 6 (six) hours.  30  tablet  0  . metroNIDAZOLE (METROGEL VAGINAL) 0.75 % vaginal gel Place 1 Applicatorful vaginally 2 (two) times daily.  70 g  0  . promethazine (PHENERGAN) 25 MG tablet Take 1 tablet (25 mg total) by mouth every 6 (six) hours as needed for nausea or vomiting.  30 tablet  0  . ritonavir (NORVIR) 100 MG TABS tablet Take 1 tablet (100 mg total) by mouth daily.  30 tablet  11  . acetaminophen (TYLENOL) 500 MG tablet Take 1,000 mg by mouth every 6 (six) hours as needed. Stomach pains        Review of Systems  Constitutional: Positive for malaise/fatigue. Negative for fever and chills.  Respiratory: Positive for shortness of breath. Negative for cough, sputum production and wheezing.   Gastrointestinal: Positive for nausea and vomiting. Negative for abdominal pain.  Neurological: Positive for dizziness and weakness. Negative for focal weakness and headaches.  Psychiatric/Behavioral: The patient is nervous/anxious.    Physical Exam   Blood pressure 148/98, pulse 71, temperature 98.7 F (37.1 C), temperature source Oral, resp. rate 22, height 5\' 2"  (1.575 m), weight 96.616 kg (213 lb), last menstrual period 06/21/2013, SpO2 100.00%. Filed Vitals:   08/13/13 2306 08/14/13 0119  BP:  148/98 124/69  Pulse: 71 60  Temp: 98.7 F (37.1 C)   TempSrc: Oral   Resp: 22 20  Height: 5\' 2"  (1.575 m)   Weight: 96.616 kg (213 lb)   SpO2: 100%    Was very agitated/panic attack with first blood pressure, calmer with second  Physical Exam  Constitutional: She is oriented to person, place, and time. She appears well-developed and well-nourished. No distress.  HENT:  Head: Normocephalic.  Cardiovascular: Normal rate, regular rhythm and normal heart sounds.  Exam reveals no gallop and no friction rub.   No murmur heard. Respiratory: Effort normal and breath sounds normal. No respiratory distress. She has no wheezes. She has no rales. She exhibits no tenderness.  GI: Soft. She exhibits no distension. There is  no tenderness. There is no rebound and no guarding.  Musculoskeletal: Normal range of motion.  Neurological: She is alert and oriented to person, place, and time.  Skin: Skin is warm and dry.  Psychiatric: She has a normal mood and affect.    MAU Course  Procedures  MDM Given Zofran with good relief  Assessment and Plan  A:  SIUP at 436w5d       Nausea and vomiting of pregnancy, well - hydrated       HIV       Anxiety  P:  Discharge home       Has failed several other meds. Discussed unkown safety profile of Zofran, risk/benefit.        Rx Zofran, advised take only 4mg  and space out as much as possible, take as little as she can.        Small frequent meals        Followup in clinic  [facility] CenterWILLIAMS,Floella Ensz 08/14/2013, 12:03 AM

## 2013-08-14 NOTE — Discharge Instructions (Signed)

## 2013-08-15 NOTE — MAU Provider Note (Signed)
Attestation of Attending Supervision of Advanced Practitioner (CNM/NP): Evaluation and management procedures were performed by the Advanced Practitioner under my supervision and collaboration.  I have reviewed the Advanced Practitioner's note and chart, and I agree with the management and plan.  Gid Schoffstall 08/15/2013 12:14 PM

## 2013-08-22 ENCOUNTER — Ambulatory Visit: Payer: Self-pay

## 2013-08-22 ENCOUNTER — Ambulatory Visit (INDEPENDENT_AMBULATORY_CARE_PROVIDER_SITE_OTHER): Payer: Self-pay | Admitting: Internal Medicine

## 2013-08-22 VITALS — BP 124/87 | HR 101 | Temp 97.5°F | Wt 203.0 lb

## 2013-08-22 DIAGNOSIS — Z3491 Encounter for supervision of normal pregnancy, unspecified, first trimester: Secondary | ICD-10-CM

## 2013-08-22 DIAGNOSIS — R1115 Cyclical vomiting syndrome unrelated to migraine: Secondary | ICD-10-CM

## 2013-08-22 DIAGNOSIS — I10 Essential (primary) hypertension: Secondary | ICD-10-CM

## 2013-08-22 DIAGNOSIS — E876 Hypokalemia: Secondary | ICD-10-CM

## 2013-08-22 DIAGNOSIS — Z331 Pregnant state, incidental: Secondary | ICD-10-CM

## 2013-08-22 DIAGNOSIS — L282 Other prurigo: Secondary | ICD-10-CM

## 2013-08-22 DIAGNOSIS — B20 Human immunodeficiency virus [HIV] disease: Secondary | ICD-10-CM

## 2013-08-22 MED ORDER — ONDANSETRON 8 MG PO TBDP: 8.0000 mg | ORAL_TABLET | Freq: Three times a day (TID) | ORAL | Status: DC | PRN

## 2013-08-22 NOTE — Progress Notes (Signed)
Subjective:    Patient ID: Catherine Simmons, female    DOB: [birth year], 28 y.o.   MRN: [id number]  HPI 28yo F with HIV disease, Cd 4 count of 830/VL<20, geno K103N, previously on triomeq doing well then found to be pregnant on 08/10/13. She was switched to truvada-boosted atazanavir. She is G3P3, at 8wk 6 d gestation. She has not had her first OB appt yet (next week), although has already had 2 visits to [facility]U with one visit with vaginal spotting found ot have subchorionic hemorrhage and BV on 6/22, then back to [facility] on 7/6 for protracted N/V. She is stating that she vomits frequently and notices her vomitus has remants of atazanavir gel cap. She states that this drug causes her the most GI upset, however she is vomiting throughout the day. She also has concurrent pruritic rash/ mostly torso nad appendages, spares face. Not on prenatals. She thinks rash started roughly 1-2 wk after switch in regimen. Has been using hydrocortisone cream without success. Also noticed some weight loss  Current Outpatient Prescriptions on File Prior to Visit  Medication Sig Dispense Refill  . acetaminophen (TYLENOL) 500 MG tablet Take 1,000 mg by mouth every 6 (six) hours as needed. Stomach pains      . amLODipine (NORVASC) 10 MG tablet TAKE 1 TABLET BY MOUTH DAILY  30 tablet  5  . atazanavir (REYATAZ) 300 MG capsule Take 1 capsule (300 mg total) by mouth daily with breakfast.  30 capsule  11  . emtricitabine-tenofovir (TRUVADA) 200-300 MG per tablet Take 1 tablet by mouth daily.  30 tablet  11  . hydrochlorothiazide (MICROZIDE) 12.5 MG capsule Take 12.5 mg by mouth daily.      . metoCLOPramide (REGLAN) 10 MG tablet Take 1 tablet (10 mg total) by mouth every 6 (six) hours.  30 tablet  0  . metroNIDAZOLE (METROGEL VAGINAL) 0.75 % vaginal gel Place 1 Applicatorful vaginally 2 (two) times daily.  70 g  0  . ondansetron (ZOFRAN ODT) 8 MG disintegrating tablet Take 1 tablet (8 mg total) by mouth every 8 (eight) hours as needed  for nausea or vomiting.  20 tablet  0  . promethazine (PHENERGAN) 25 MG tablet Take 1 tablet (25 mg total) by mouth every 6 (six) hours as needed for nausea or vomiting.  30 tablet  0  . ritonavir (NORVIR) 100 MG TABS tablet Take 1 tablet (100 mg total) by mouth daily.  30 tablet  11   No current facility-administered medications on file prior to visit.       Review of Systems 10 point ros is reviewed, positive pertinents listed above    Objective:   Physical Exam BP 124/87  Pulse 101  Temp(Src) 97.5 F (36.4 C) (Oral)  Wt 203 lb (92.08 kg)  LMP 06/21/2013 Physical Exam  Constitutional:  oriented to person, place, and time. appears well-developed and well-nourished. Looks ill appearing but no acute distress.  HENT: slight jaundice to eyes Mouth/Throat: Oropharynx is clear and moist. No oropharyngeal exudate.  Cardiovascular: Normal rate, regular rhythm and normal heart sounds. Exam reveals no gallop and no friction rub.  No murmur heard.  Pulmonary/Chest: Effort normal and breath sounds normal. No respiratory distress.  has no wheezes.  Abdominal: Soft. Bowel sounds are normal.  exhibits no distension. There is no tenderness.  Lymphadenopathy: no cervical adenopathy.  Neurological: alert and oriented to person, place, and time.  Skin: Skin is warm and dry. No rash noted. No erythema.  Psychiatric:  a normal mood and affect.  behavior is normal.   Labs: CMP     Component Value Date/Time   NA 132* 08/22/2013 1254   K 2.9* 08/22/2013 1254   CL 95* 08/22/2013 1254   CO2 28 08/22/2013 1254   GLUCOSE 108* 08/22/2013 1254   BUN 6 08/22/2013 1254   CREATININE 0.54 08/22/2013 1254   CREATININE 0.54 08/10/2013 2256   CALCIUM 9.2 08/22/2013 1254   PROT 6.8 08/22/2013 1254   ALBUMIN 4.1 08/22/2013 1254   AST 12 08/22/2013 1254   ALT 11 08/22/2013 1254   ALKPHOS 55 08/22/2013 1254   BILITOT 2.5* 08/22/2013 1254   GFRNONAA >89 08/22/2013 1254   GFRNONAA >90 08/10/2013 2256   GFRAA >89 08/22/2013  1254   GFRAA >90 08/10/2013 2256       Assessment & Plan:  First trimester intractable n/v, concern for hyperemesis gravidum = will refill zofran and ask her to take it scheduled so that she has better management of her symptoms. Very concern that she is not absorbing her HIV medications. After consulting [facility] perinatal hotline, we discussed various options. I have asked her to stop taking her HIV medications for 2 wk, optimize her anti-emetics, then re-start the regimen at next visit  - will check cmp to see if she has electrolye imbalance  Hypokalemia = will replete her potassium 40meq x 3 days, then retest her blood work at next visit to see if she needs further repletion  hypertension = she is to see OB next week on 7/223, will defer to them to change her anti-hypertensive medications to be more compatible for pregnancy  pregnancy = she does not appear to be on prenatal vitamins yet. Instructed her to get prenatals PLUS folic acid 1mg  daily. Also See if can move up her OB appointment  Protracted n/v = on zofran with some success. Will give refills.  hiv = concern that she is not having absorption of meds. After discussion with hotline, have asked her to TEMPORARILY stop HIV regimen for 2 wk until we see her and have her restart once her nausea is improved. Will register her onto the antiretroviral-pregnancy registry  Rash = not completely consistent with drug rash, however this did start after she was on new regimen. Could be due to truvada or atazanavir. For now, will do benadryl cream and switch to bland soap.   Isolated increase in alt = will recheck cmp  Hyperbilirubinemia = likely due to atazanavir as we would expect  rtc in 2 wk

## 2013-08-23 ENCOUNTER — Telehealth: Payer: Self-pay | Admitting: *Deleted

## 2013-08-23 LAB — COMPLETE METABOLIC PANEL WITH GFR
ALBUMIN: 4.1 g/dL (ref 3.5–5.2)
ALK PHOS: 55 U/L (ref 39–117)
ALT: 11 U/L (ref 0–35)
AST: 12 U/L (ref 0–37)
BUN: 6 mg/dL (ref 6–23)
CALCIUM: 9.2 mg/dL (ref 8.4–10.5)
CHLORIDE: 95 meq/L — AB (ref 96–112)
CO2: 28 mEq/L (ref 19–32)
Creat: 0.54 mg/dL (ref 0.50–1.10)
GFR, Est African American: >89 mL/min
GFR, Est Non African American: >89 mL/min
Glucose, Bld: 108 mg/dL — ABNORMAL HIGH (ref 70–99)
POTASSIUM: 2.9 meq/L — AB (ref 3.5–5.3)
SODIUM: 132 meq/L — AB (ref 135–145)
TOTAL PROTEIN: 6.8 g/dL (ref 6.0–8.3)
Total Bilirubin: 2.5 mg/dL — ABNORMAL HIGH (ref 0.2–1.2)

## 2013-08-23 MED ORDER — POTASSIUM CHLORIDE ER 10 MEQ PO TBCR
40.0000 meq | EXTENDED_RELEASE_TABLET | Freq: Every day | ORAL | Status: DC
Start: 2013-08-23 — End: 2013-12-14

## 2013-08-23 NOTE — Telephone Encounter (Signed)
Called to see if [facility] could [facility] patient's appointment d/t protracted n/v.  They are unable to, but advised patient to go to the [facility] if she is having trouble. RN called patient, had to leave a message with the directions.

## 2013-08-31 ENCOUNTER — Encounter: Payer: Self-pay | Admitting: Obstetrics & Gynecology

## 2013-08-31 ENCOUNTER — Ambulatory Visit (INDEPENDENT_AMBULATORY_CARE_PROVIDER_SITE_OTHER): Payer: Medicaid Other | Admitting: Family Medicine

## 2013-08-31 VITALS — BP 128/76 | HR 82 | Temp 98.6°F | Wt 213.8 lb

## 2013-08-31 DIAGNOSIS — O169 Unspecified maternal hypertension, unspecified trimester: Secondary | ICD-10-CM

## 2013-08-31 DIAGNOSIS — E669 Obesity, unspecified: Secondary | ICD-10-CM

## 2013-08-31 DIAGNOSIS — O0990 Supervision of high risk pregnancy, unspecified, unspecified trimester: Secondary | ICD-10-CM | POA: Insufficient documentation

## 2013-08-31 DIAGNOSIS — B20 Human immunodeficiency virus [HIV] disease: Secondary | ICD-10-CM

## 2013-08-31 DIAGNOSIS — O161 Unspecified maternal hypertension, first trimester: Secondary | ICD-10-CM

## 2013-08-31 DIAGNOSIS — Z8742 Personal history of other diseases of the female genital tract: Secondary | ICD-10-CM

## 2013-08-31 DIAGNOSIS — O0991 Supervision of high risk pregnancy, unspecified, first trimester: Secondary | ICD-10-CM

## 2013-08-31 DIAGNOSIS — O10019 Pre-existing essential hypertension complicating pregnancy, unspecified trimester: Secondary | ICD-10-CM

## 2013-08-31 DIAGNOSIS — O10919 Unspecified pre-existing hypertension complicating pregnancy, unspecified trimester: Secondary | ICD-10-CM | POA: Insufficient documentation

## 2013-08-31 DIAGNOSIS — O10911 Unspecified pre-existing hypertension complicating pregnancy, first trimester: Secondary | ICD-10-CM

## 2013-08-31 HISTORY — DX: Personal history of other diseases of the female genital tract: Z87.42

## 2013-08-31 LAB — POCT URINALYSIS DIP (DEVICE)
BILIRUBIN URINE: NEGATIVE
Bilirubin Urine: NEGATIVE
GLUCOSE, UA: NEGATIVE mg/dL
Glucose, UA: NEGATIVE mg/dL
Hgb urine dipstick: NEGATIVE
Hgb urine dipstick: NEGATIVE
Ketones, ur: NEGATIVE mg/dL
Leukocytes, UA: NEGATIVE
Leukocytes, UA: NEGATIVE
NITRITE: NEGATIVE
NITRITE: NEGATIVE
PH: 6 (ref 5.0–8.0)
Protein, ur: NEGATIVE mg/dL
Protein, ur: NEGATIVE mg/dL
Specific Gravity, Urine: 1.02 (ref 1.005–1.030)
Specific Gravity, Urine: 1.015 (ref 1.005–1.030)
UROBILINOGEN UA: 0.2 mg/dL (ref 0.0–1.0)
Urobilinogen, UA: 0.2 mg/dL (ref 0.0–1.0)
pH: 6 (ref 5.0–8.0)

## 2013-08-31 NOTE — Progress Notes (Signed)
Nutrition note: 1st visit consult Pt has h/o obesity, HIV & HTN. Pt has lost 4.2# @ 1333w1d. Pt reports she had a lot of N/V in the beginning, which is decreasing but still has some @ night. Pt reports eating 2 meals & 3 snacks/d. Pt reports no heartburn but has some constipation. Pt is taking gummy prenatal vitamins, which do not have iron (pt's iron was low 08/10/13). Pt received verbal & written education on general nutrition during pregnancy. Discussed tips to decrease N/V and constipation. Encouraged PNV or 2 chewable multivitamins that have iron. Discussed tips to DC milk supply safely (pumping for comfort, cold cabbage leaves, etc). Discussed wt gain goals of 11-20# or 0.5#/wk. Pt agrees to take PNV or equivalent.  Pt does not have WIC but plans to apply. Pt is unable to BF. F/u in 4-6 wks Blondell RevealLaura Eulis Salazar, MS, RD, LDN, [facility]CLC

## 2013-08-31 NOTE — Patient Instructions (Addendum)
Hypertension During Pregnancy Hypertension is also called high blood pressure. Blood pressure moves blood in your body. Sometimes, the force that moves the blood becomes too strong. When you are pregnant, this condition should be watched carefully. It can cause problems for you and your baby. HOME CARE   Make and keep all of your doctor visits.  Take medicine as told by your doctor. Tell your doctor about all medicines you take.  Eat very little salt.  Exercise regularly.  Do not drink alcohol.  Do not smoke.  Do not have drinks with caffeine.  Lie on your left side when resting.  Your health care provider may ask you to take one low-dose aspirin (81mg ) each day. GET HELP RIGHT AWAY IF:  You have bad belly (abdominal) pain.  You have sudden puffiness (swelling) in the hands, ankles, or face.  You gain 4 pounds (1.8 kilograms) or more in 1 week.  You throw up (vomit) repeatedly.  You have bleeding from the vagina.  You do not feel the baby moving as much.  You have a headache.  You have blurred or double vision.  You have muscle twitching or spasms.  You have shortness of breath.  You have blue fingernails and lips.  You have blood in your pee (urine). MAKE SURE YOU:  Understand these instructions.  Will watch your condition.  Will get help right away if you are not doing well or get worse. Document Released: 02/28/2010 Document Revised: 06/12/2013 Document Reviewed: 08/25/2012 [facility] Patient Information 2015 [place], [place]. This information is not intended to replace advice given to you by your health care provider. Make sure you discuss any questions you have with your health care provider.    Pregnancy and HIV DIAGNOSIS  The diagnosis is made by suspicion and a positive ELISA assay blood test. This is confirmed by a Western blot test (types of lab tests). If you have not been tested for HIV, it is a good idea to do so. Especially if you are planning to  get pregnant, have been exposed to or taken part in high risk behavior. This would include use of intravenous drugs, exposure to multiple sex partners, having had a sexually transmitted disease or having a relationship with a person involved in high risk behavior. Approximately 25-35% of mothers who carry the human immunodeficiency virus will pass this infection on to their baby if not treated. TREATMENT It is advised that women should be tested for HIV before getting pregnant. The following is also recommended:  All pregnant women should know that they are being tested for HIV when the routine blood tests are taken on their first prenatal visit. If a woman refuses to be tested for HIV, it should be documented in her medical record and be signed by her and the caregiver.  The test should be repeated in the third trimester in women who are at high risk for HIV.  The HIV viral counts in the blood should be repeated every three months and if the antiviral medications change.  If a pregnant woman is tested positive for HIV, she should take antiviral HIV medications during the pregnancy, labor and delivery (vaginal delivery or Cesarean Section). The baby needs to be treated right away because it can lower the risk of the baby being infected with HIV infection from 25% to 2%.  Pregnant women with high viral counts should have a Cesarean delivery at 38 to 39 weeks of the pregnancy before labor begins or the membranes rupture.  EFFECTS ON PREGNANCY HIV does not significantly affect the pregnancy. The birth weights, gestational age at delivery, and abortion rates are about the same as a pregnancy which has not been infected. However, if the pregnant woman is not treated during labor and delivery and the baby is not treated right after delivery, the baby can get HIV. The baby can get what is called opportunistic infections. These types of infections can include pneumonia, intestinal problems and convulsions.  Also, if HIV progresses to AIDS in a pregnant woman and more serious problems develop, the baby can be affected. If AIDS with its serious problems occur during the pregnancy, then the baby can have problems including growth retardation, prematurity, premature rupture of the membranes with early delivery and other problems. DELIVERY The use of fetal scalp electrodes and fetal scalp blood sampling during labor should be avoided. This decreases the chances of passing infected blood from the mother to the baby. Breastfeeding should be avoided following delivery. You may discuss all this information with your caregiver.  HOME CARE INSTRUCTIONS   Only take over-the-counter or prescription medicine for pain, discomfort or fever as directed by your caregiver.  Do not take aspirin. It can cause bleeding.  Take your antiviral HIV medications as directed.  Consider joining a HIV support group or counseling.  Get rest and sleep.  Eat a balanced diet.  Take your prenatal vitamins and any other supplements as directed.  Get vaccinated against Hepatitis A and B, influenza, pneumococcal pneumonia, tetanus, polio, measles, mumps, whooping cough and other diseases that you may be more likely to get if you did not get these vaccinations.  Do not smoke, take illegal drugs or drink alcohol.  Inform all your sexual contacts that you have been diagnosed with HIV. SEEK MEDICAL CARE IF:   You think you have or been exposed to HIV.  You develop dizziness.  You have a lingering sore throat with or without fever.  You develop ulcers in your mouth or on your genital area.  You develop an uncontrollable cough.  You have severe headaches not controlled with recommended medicine.  You become confused or depressed. SEEK IMMEDIATE MEDICAL CARE IF:   You faint or pass out.  You have a temperature of 100 F (37.8 C) or higher.  You have a convulsion.  You a have problem breathing.  You cough up  blood.  You have uncontrolled vomiting or diarrhea.  You develop abdominal pain.  You have leakage of fluid or blood from the vagina.  You develop uterine contractions.  You develop visual problems.  You develop purple or blue skin blotches. Document Released: 05/04/2000 Document Revised: 04/20/2011 Document Reviewed: 11/25/2012 [facility] Patient Information 2015 [place], [place]. This information is not intended to replace advice given to you by your health care provider. Make sure you discuss any questions you have with your health care provider.

## 2013-08-31 NOTE — Progress Notes (Signed)
Pain-epigastric, lower abd "cramping" and lower back.  Pt is observed grimacing, holding her back, breathing due to cramping today  New ob packet given  Weight 11-20lbs Early glucola given due to BMI >30  Pt has reported not take medication for HIV infection for one week due to provider took her off it

## 2013-08-31 NOTE — Progress Notes (Signed)
HISTORY AND PHYSICAL  Catherine Simmons is a 28 y.o. female G3P2002 with IUP at [redacted]w[redacted]d by LMP presenting to establish care.  She request tubal ligation for birth control.  Viral load has been low, +emesis and ID (Dr. Ilsa Iha) discontinued HAART therapy, new meds to be started on Tuesday per pt.  HTN: norvasc, HCTZ  Dating: By LMP --->  Estimated Date of Delivery: 03/28/14   Prenatal History/Complications:  Past Medical History: Past Medical History  Diagnosis Date  . Cholecystitis 07/16/10  . Hypertension   . HIV (human immunodeficiency virus infection)     Past Surgical History: Past Surgical History  Procedure Laterality Date  . Cholecystectomy    . Wisdom tooth extraction      Obstetrical History: OB History   Grav Para Term Preterm Abortions TAB SAB Ect Mult Living   3 2 2       2      2006 TSVD female 7lb 1oz 2012 TSVD female 7lb 12oz   Social History: History   Social History  . Marital Status: Single    Spouse Name: N/A    Number of Children: N/A  . Years of Education: N/A   Social History Main Topics  . Smoking status: Former Smoker    Start date: 03/11/2012    Quit date: 08/24/2012  . Smokeless tobacco: Never Used  . Alcohol Use: No  . Drug Use: No  . Sexual Activity: Yes    Partners: Male   Other Topics Concern  . Not on file   Social History Narrative  . No narrative on file    Family History: Family History  Problem Relation Age of Onset  . Cancer Mother   . Diabetes Maternal Aunt     Allergies: Allergies  Allergen Reactions  . Stadol [Butorphanol Tartrate] Itching     (Not in a hospital admission)   Review of Systems   All systems reviewed and negative except as stated in HPI  Last menstrual period 06/21/2013. General appearance: alert Lungs: clear to auscultation bilaterally Heart: regular rate and rhythm Abdomen: soft, non-tender; bowel sounds normal Pelvic: deferred, had pap in May Extremities: Homans sign is  negative, no sign of DVT DTR's normal     Results for orders placed in visit on 08/31/13 (from the past 24 hour(s))  POCT URINALYSIS DIP (DEVICE)   Collection Time    08/31/13  9:00 AM      Result Value Ref Range   Glucose, UA NEGATIVE  NEGATIVE mg/dL   Bilirubin Urine NEGATIVE  NEGATIVE   Ketones, ur NEGATIVE  NEGATIVE mg/dL   Specific Gravity, Urine 1.020  1.005 - 1.030   Hgb urine dipstick NEGATIVE  NEGATIVE   pH 6.0  5.0 - 8.0   Protein, ur NEGATIVE  NEGATIVE mg/dL   Urobilinogen, UA 0.2  0.0 - 1.0 mg/dL   Nitrite NEGATIVE  NEGATIVE   Leukocytes, UA NEGATIVE  NEGATIVE    Patient Active Problem List   Diagnosis Date Noted  . Anxiety 06/30/2013  . Abscess of groin 04/24/2013  . Body aches 01/26/2013  . Fracture dislocation of digit of hand 01/26/2013  . Nausea alone 01/26/2013  . Human immunodeficiency virus (HIV) disease 10/12/2012  . Other disorder of menstruation and other abnormal bleeding from female genital tract 10/07/2012  . Possible exposure to STD 10/07/2012  . Boil, groin 08/16/2012  . Pelvic pain 06/03/2012  . Atypical chest pain 03/18/2012  . Abdominal pain 03/18/2012  . PID (acute pelvic inflammatory disease)  10/17/2010  . UTI (lower urinary tract infection) 10/17/2010  . Chest pain 05/14/2010  . Abdominal  pain, other specified site 05/13/2010  . Hypertension 04/10/2010  . Postpartum exam 04/10/2010  . GROUP B STREPTOCOCCUS CARRIER 02/06/2010  . UNSPECIFIED VAGINITIS AND VULVOVAGINITIS 01/01/2010  . CARPAL TUNNEL SYNDROME 11/14/2009  . NUMMULAR ECZEMA 11/14/2009  . Leukorrhea, not specified as infective 09/10/2009  . BROKEN TOOTH, INFECTED 08/22/2009  . UNSPECIFIED DISORDER TEETH&SUPPORTING STRUCTURES 08/13/2009  . AMENORRHEA 06/25/2009  . GONOCOCCAL CERVICITIS 05/09/2008  . OBESITY 05/09/2008  . SMOKER 05/09/2008  . DEPRESSION 05/09/2008  . EXCESSIVE OR FREQUENT MENSTRUATION 03/29/2007  . PYELONEPHRITIS 07/02/2006    Assessment: Catherine MccallumMarkita L  Simmons is a 28 y.o. G3P2002 at 6028w1d here to establish care cHTN group 1: d/c HCTZ, RTC for nurse visit BP check 2 weeks; continue norvasc 10mg ; 24h urine prot/cr ordered, CMET as well.  Diet advised  HIV: discussed risks, f/u with ID, pt compliant with low viral load  Supervision of high-risk pregnancy, first trimester - Prenatal (OB Panel) - Hemoglobinopathy evaluation - Culture, OB Urine - Glucose Tolerance, 1 HR (50g) - GC/chlamydia probe amp, urine - AMB Referral to Maternal Fetal Medicine (MFM) - agrees to genetic testing  Hypertension in pregnancy, antepartum, first trimester - Protein, Urine, 24 hour - Creatinine Clearance, Urine, 24 Hour; Future - CMET  Catherine Simmons 08/31/2013, 9:06 AM

## 2013-09-01 LAB — OBSTETRIC PANEL
Antibody Screen: NEGATIVE
Basophils Absolute: 0 10*3/uL (ref 0.0–0.1)
Basophils Relative: 0 % (ref 0–1)
Eosinophils Absolute: 0.1 10*3/uL (ref 0.0–0.7)
Eosinophils Relative: 1 % (ref 0–5)
HCT: 29.3 % — ABNORMAL LOW (ref 36.0–46.0)
Hemoglobin: 9.9 g/dL — ABNORMAL LOW (ref 12.0–15.0)
Hepatitis B Surface Ag: NEGATIVE
Lymphocytes Relative: 28 % (ref 12–46)
Lymphs Abs: 1.5 10*3/uL (ref 0.7–4.0)
MCH: 27.8 pg (ref 26.0–34.0)
MCHC: 33.8 g/dL (ref 30.0–36.0)
MCV: 82.3 fL (ref 78.0–100.0)
Monocytes Absolute: 0.4 10*3/uL (ref 0.1–1.0)
Monocytes Relative: 7 % (ref 3–12)
Neutro Abs: 3.4 10*3/uL (ref 1.7–7.7)
Neutrophils Relative %: 64 % (ref 43–77)
Platelets: 243 10*3/uL (ref 150–400)
RBC: 3.56 MIL/uL — ABNORMAL LOW (ref 3.87–5.11)
RDW: 13.8 % (ref 11.5–15.5)
Rh Type: POSITIVE
Rubella: 3.1 Index — ABNORMAL HIGH (ref <0.90)
WBC: 5.3 10*3/uL (ref 4.0–10.5)

## 2013-09-01 LAB — GLUCOSE TOLERANCE, 1 HOUR (50G) W/O FASTING: GLUCOSE 1 HOUR GTT: 110 mg/dL (ref 70–140)

## 2013-09-03 LAB — CULTURE, OB URINE: Colony Count: 70000

## 2013-09-04 ENCOUNTER — Telehealth: Payer: Self-pay | Admitting: Internal Medicine

## 2013-09-04 LAB — HEMOGLOBINOPATHY EVALUATION
Hemoglobin Other: 0 %
Hgb A2 Quant: 3.3 % — ABNORMAL HIGH (ref 2.2–3.2)
Hgb A: 57.1 % — ABNORMAL LOW (ref 96.8–97.8)
Hgb F Quant: 0 % (ref 0.0–2.0)
Hgb S Quant: 39.6 % — ABNORMAL HIGH

## 2013-09-04 NOTE — Telephone Encounter (Signed)
Doing a lot better in terms of n/v. Has appt tomorrow where we will start her on new regimen. Decision to either do truvada-prezcobix vs. truvada-RLG

## 2013-09-05 ENCOUNTER — Encounter: Payer: Self-pay | Admitting: Internal Medicine

## 2013-09-05 ENCOUNTER — Encounter: Payer: Self-pay | Admitting: *Deleted

## 2013-09-05 ENCOUNTER — Ambulatory Visit (INDEPENDENT_AMBULATORY_CARE_PROVIDER_SITE_OTHER): Payer: Medicaid Other | Admitting: Internal Medicine

## 2013-09-05 VITALS — BP 124/84 | HR 73 | Temp 98.7°F | Wt 215.0 lb

## 2013-09-05 DIAGNOSIS — B20 Human immunodeficiency virus [HIV] disease: Secondary | ICD-10-CM

## 2013-09-05 LAB — COMPREHENSIVE METABOLIC PANEL
ALK PHOS: 50 U/L (ref 39–117)
ALT: 10 U/L (ref 0–35)
AST: 8 U/L (ref 0–37)
Albumin: 3.3 g/dL — ABNORMAL LOW (ref 3.5–5.2)
BUN: 7 mg/dL (ref 6–23)
CALCIUM: 8.9 mg/dL (ref 8.4–10.5)
CHLORIDE: 104 meq/L (ref 96–112)
CO2: 24 mEq/L (ref 19–32)
Creat: 0.51 mg/dL (ref 0.50–1.10)
GLUCOSE: 90 mg/dL (ref 70–99)
Potassium: 3.9 mEq/L (ref 3.5–5.3)
SODIUM: 135 meq/L (ref 135–145)
TOTAL PROTEIN: 6.1 g/dL (ref 6.0–8.3)
Total Bilirubin: 0.2 mg/dL (ref 0.2–1.2)

## 2013-09-05 LAB — GC/CHLAMYDIA PROBE AMP, URINE
Chlamydia, Swab/Urine, PCR: NEGATIVE
GC Probe Amp, Urine: NEGATIVE

## 2013-09-05 MED ORDER — DARUNAVIR-COBICISTAT 800-150 MG PO TABS: 1.0000 | ORAL_TABLET | Freq: Every day | ORAL | Status: DC

## 2013-09-05 NOTE — Progress Notes (Signed)
Subjective:    Patient ID: Catherine Simmons, female    DOB: [birth year], 28 y.o.   MRN: [id number]  HPI 28yo F with HIV, who is [redacted] wks gestation, CD4 count 830/VL<20 in April. She had significant hyperemesis gravida in her first trimester. We had stopped her HIV meds temporarily for 2 wk due to concern of malabsorption of medications. Patient is feeling much improved. Only uses anti emetics prn. She is ready to get restarted on hiv meds.  Current Outpatient Prescriptions on File Prior to Visit  Medication Sig Dispense Refill  . acetaminophen (TYLENOL) 500 MG tablet Take 1,000 mg by mouth every 6 (six) hours as needed. Stomach pains      . amLODipine (NORVASC) 10 MG tablet TAKE 1 TABLET BY MOUTH DAILY  30 tablet  5  . metoCLOPramide (REGLAN) 10 MG tablet Take 1 tablet (10 mg total) by mouth every 6 (six) hours.  30 tablet  0  . metroNIDAZOLE (METROGEL VAGINAL) 0.75 % vaginal gel Place 1 Applicatorful vaginally 2 (two) times daily.  70 g  0  . potassium chloride (K-DUR) 10 MEQ tablet Take 4 tablets (40 mEq total) by mouth daily. X 3 days only. Then use as instructed  30 tablet  0  . Prenatal Vit-Min-FA-Fish Oil ([employer] PRENATAL GUMMY PO) Take 1 tablet by mouth daily.      . promethazine (PHENERGAN) 25 MG tablet Take 1 tablet (25 mg total) by mouth every 6 (six) hours as needed for nausea or vomiting.  30 tablet  0  . emtricitabine-tenofovir (TRUVADA) 200-300 MG per tablet Take 1 tablet by mouth daily.  30 tablet  11  . ondansetron (ZOFRAN ODT) 8 MG disintegrating tablet Take 1 tablet (8 mg total) by mouth every 8 (eight) hours as needed for nausea or vomiting.  30 tablet  2   No current facility-administered medications on file prior to visit.   Active Ambulatory Problems    Diagnosis Date Noted  . OBESITY 05/09/2008  . SMOKER 05/09/2008  . DEPRESSION 05/09/2008  . CARPAL TUNNEL SYNDROME 11/14/2009  . AMENORRHEA 06/25/2009  . NUMMULAR ECZEMA 11/14/2009  . Pelvic pain 06/03/2012  . Other  disorder of menstruation and other abnormal bleeding from female genital tract 10/07/2012  . Human immunodeficiency virus (HIV) disease 10/12/2012  . Anxiety 06/30/2013  . Supervision of high-risk pregnancy 08/31/2013  . HTN in pregnancy, chronic 08/31/2013  . Hx of pelvic inflammatory disease 08/31/2013   Resolved Ambulatory Problems    Diagnosis Date Noted  . GONOCOCCAL CERVICITIS 05/09/2008  . UNSPECIFIED DISORDER TEETH&SUPPORTING STRUCTURES 08/13/2009  . PYELONEPHRITIS 07/02/2006  . UNSPECIFIED VAGINITIS AND VULVOVAGINITIS 01/01/2010  . Leukorrhea, not specified as infective 09/10/2009  . Excessive or frequent menstruation 03/29/2007  . Dysuria 09/10/2009  . BROKEN TOOTH, INFECTED 08/22/2009  . GROUP B STREPTOCOCCUS CARRIER 02/06/2010  . Hypertension 04/10/2010  . Postpartum exam 04/10/2010  . Abdominal pain, other specified site 05/13/2010  . Chest pain 05/14/2010  . Cholelithiasis 05/28/2010  . PID (acute pelvic inflammatory disease) 10/17/2010  . UTI (lower urinary tract infection) 10/17/2010  . Atypical chest pain 03/18/2012  . Abdominal pain 03/18/2012  . Boil, groin 08/16/2012  . Possible exposure to STD 10/07/2012  . Body aches 01/26/2013  . Fracture dislocation of digit of hand 01/26/2013  . Nausea alone 01/26/2013  . Abscess of groin 04/24/2013   Past Medical History  Diagnosis Date  . Cholecystitis 07/16/10  . HIV (human immunodeficiency virus infection)   . Genital warts 2004  Review of Systems Much improved nausea    Objective:   Physical Exam BP 124/84  Pulse 73  Temp(Src) 98.7 F (37.1 C) (Oral)  Wt 215 lb (97.523 kg)  LMP 06/21/2013 Physical Exam  Constitutional:  oriented to person, place, and time. appears well-developed and well-nourished. No distress.  HENT:  Mouth/Throat: Oropharynx is clear and moist. No oropharyngeal exudate.  Cardiovascular: Normal rate, regular rhythm and normal heart sounds. Exam reveals no gallop and no  friction rub.  No murmur heard.  Pulmonary/Chest: Effort normal and breath sounds normal. No respiratory distress.  has no wheezes.  Skin: Skin is warm and dry. No rash noted. No erythema.  Psychiatric: a normal mood and affect.  behavior is normal.     Lab Results  Component Value Date   CD4TCELL 37 05/31/2013   CD4TABS 830 05/31/2013   Lab Results  Component Value Date   HIV1RNAQUANT <20 05/31/2013        Assessment & Plan:  hiv = we will start her on truvada-prezcobix since she is interested in enrolling in pregnancy-HIV study that enrolls at [redacted] wk gestation.  Pregnancy = continue to use folic acid and prenatal vitamins  htn = well controlled rtc in 4 wks for evaluation and labs

## 2013-09-06 LAB — PROTEIN, URINE, 24 HOUR
Protein, 24H Urine: 89 mg/d (ref 50–100)
Protein, Urine: 3 mg/dL

## 2013-09-14 ENCOUNTER — Ambulatory Visit: Payer: Medicaid Other | Admitting: *Deleted

## 2013-09-14 VITALS — BP 134/76 | HR 86

## 2013-09-14 DIAGNOSIS — Z0130 Encounter for examination of blood pressure without abnormal findings: Secondary | ICD-10-CM

## 2013-09-14 NOTE — Progress Notes (Addendum)
At last visit hctz was stopped. Todays BP 134/76. Patient advised if she develops headache, visual disturbances or dizziness to contact Catherine Simmons.

## 2013-09-18 ENCOUNTER — Telehealth: Payer: Self-pay | Admitting: Infectious Disease

## 2013-09-18 NOTE — Telephone Encounter (Signed)
This patient is interested in the [facility] Pregnancy study  She is currrently on the drugs that would be studied in that HIV pregnancy study and tolerating them well namely  PREZCOBIX AND TRUVADA  I want to make sure we can coordinate her OB care to facilitate her enrollment if she meets enrollment criteria and does not have exclusion criteria  I am cc to my Research RN, as well as OB Amanda Rash, Dr. Loreta AveAcosta as to Dr. Shawnie PonsPratt with whom I have discussed this study in the past.  This study is a PK study and will help elucidate , the kinetics of  Prezista during pregnancy. The patient would receive free PREZCOBIX from a [facility] as well as considerable compensation for her intense PK visits in particular  Screening can occur after 18 weeks with normal US and a few other criteria  Kim feel free to chime in

## 2013-09-22 ENCOUNTER — Encounter: Payer: Self-pay | Admitting: General Practice

## 2013-09-28 ENCOUNTER — Encounter: Payer: Self-pay | Admitting: Obstetrics and Gynecology

## 2013-09-28 ENCOUNTER — Ambulatory Visit (INDEPENDENT_AMBULATORY_CARE_PROVIDER_SITE_OTHER): Payer: Medicaid Other | Admitting: Obstetrics and Gynecology

## 2013-09-28 VITALS — BP 116/74 | HR 84 | Temp 98.4°F | Wt 213.5 lb

## 2013-09-28 DIAGNOSIS — O10912 Unspecified pre-existing hypertension complicating pregnancy, second trimester: Secondary | ICD-10-CM

## 2013-09-28 DIAGNOSIS — O0992 Supervision of high risk pregnancy, unspecified, second trimester: Secondary | ICD-10-CM

## 2013-09-28 DIAGNOSIS — O10019 Pre-existing essential hypertension complicating pregnancy, unspecified trimester: Secondary | ICD-10-CM

## 2013-09-28 DIAGNOSIS — O0990 Supervision of high risk pregnancy, unspecified, unspecified trimester: Secondary | ICD-10-CM

## 2013-09-28 DIAGNOSIS — B20 Human immunodeficiency virus [HIV] disease: Secondary | ICD-10-CM

## 2013-09-28 DIAGNOSIS — E669 Obesity, unspecified: Secondary | ICD-10-CM

## 2013-09-28 LAB — POCT URINALYSIS DIP (DEVICE)
BILIRUBIN URINE: NEGATIVE
GLUCOSE, UA: NEGATIVE mg/dL
HGB URINE DIPSTICK: NEGATIVE
KETONES UR: NEGATIVE mg/dL
Leukocytes, UA: NEGATIVE
NITRITE: NEGATIVE
PH: 6.5 (ref 5.0–8.0)
Protein, ur: NEGATIVE mg/dL
Specific Gravity, Urine: 1.01 (ref 1.005–1.030)
Urobilinogen, UA: 0.2 mg/dL (ref 0.0–1.0)

## 2013-09-28 LAB — RPR

## 2013-09-28 LAB — HEPATITIS C ANTIBODY: HCV Ab: NEGATIVE

## 2013-09-28 LAB — HEPATITIS B SURFACE ANTIGEN: Hepatitis B Surface Ag: NEGATIVE

## 2013-09-28 NOTE — Progress Notes (Signed)
Anatomy u/s scheduled 10/26/13 @ 8am

## 2013-09-28 NOTE — Progress Notes (Signed)
Pt feels she may have been exposed to sexually transmitted disease/desires testing

## 2013-09-28 NOTE — Progress Notes (Signed)
Patient is doing well without complaints. She is concerned about exposure to STD as her FOB has been unfaithful. Will schedule anatomy ultrasound in 4 weeks. Patient scheduled to see ID next week

## 2013-09-29 LAB — GC/CHLAMYDIA PROBE AMP
CT Probe RNA: NEGATIVE
GC Probe RNA: NEGATIVE

## 2013-10-03 ENCOUNTER — Ambulatory Visit: Payer: Self-pay | Admitting: Internal Medicine

## 2013-10-06 ENCOUNTER — Other Ambulatory Visit: Payer: Self-pay | Admitting: Infectious Disease

## 2013-10-10 ENCOUNTER — Telehealth: Payer: Self-pay | Admitting: Infectious Disease

## 2013-10-10 NOTE — Telephone Encounter (Signed)
Catherine Batten do you think we could try to enroll Catherine Simmons in [place] study?  She is now on prezcobix and truvada per Aram Beecham and tolerating it with intent of getting her into the study.   She had [place] in late June that was normal but not an 18 week one to my knowledge  Catherine Simmons

## 2013-10-12 ENCOUNTER — Encounter: Payer: Self-pay | Admitting: Internal Medicine

## 2013-10-12 ENCOUNTER — Ambulatory Visit (INDEPENDENT_AMBULATORY_CARE_PROVIDER_SITE_OTHER): Payer: Medicaid Other | Admitting: Internal Medicine

## 2013-10-12 VITALS — BP 116/79 | HR 80 | Temp 98.8°F | Wt 215.0 lb

## 2013-10-12 DIAGNOSIS — Z23 Encounter for immunization: Secondary | ICD-10-CM

## 2013-10-12 DIAGNOSIS — Z21 Asymptomatic human immunodeficiency virus [HIV] infection status: Secondary | ICD-10-CM

## 2013-10-12 LAB — COMPLETE METABOLIC PANEL WITH GFR
ALK PHOS: 57 U/L (ref 39–117)
AST: 7 U/L (ref 0–37)
Albumin: 3.3 g/dL — ABNORMAL LOW (ref 3.5–5.2)
BUN: 6 mg/dL (ref 6–23)
CALCIUM: 8.3 mg/dL — AB (ref 8.4–10.5)
CHLORIDE: 105 meq/L (ref 96–112)
CO2: 23 mEq/L (ref 19–32)
CREATININE: 0.51 mg/dL (ref 0.50–1.10)
GFR, Est African American: >89 mL/min
GFR, Est Non African American: >89 mL/min
Glucose, Bld: 73 mg/dL (ref 70–99)
Potassium: 3.9 mEq/L (ref 3.5–5.3)
Sodium: 134 mEq/L — ABNORMAL LOW (ref 135–145)
Total Bilirubin: 0.3 mg/dL (ref 0.2–1.2)
Total Protein: 6 g/dL (ref 6.0–8.3)

## 2013-10-12 LAB — CBC WITH DIFFERENTIAL/PLATELET
BASOS PCT: 0 % (ref 0–1)
Basophils Absolute: 0 10*3/uL (ref 0.0–0.1)
EOS ABS: 0.1 10*3/uL (ref 0.0–0.7)
Eosinophils Relative: 1 % (ref 0–5)
HCT: 29.6 % — ABNORMAL LOW (ref 36.0–46.0)
HEMOGLOBIN: 10.2 g/dL — AB (ref 12.0–15.0)
LYMPHS ABS: 1.7 10*3/uL (ref 0.7–4.0)
Lymphocytes Relative: 22 % (ref 12–46)
MCH: 27.9 pg (ref 26.0–34.0)
MCHC: 34.5 g/dL (ref 30.0–36.0)
MCV: 81.1 fL (ref 78.0–100.0)
MONO ABS: 0.7 10*3/uL (ref 0.1–1.0)
Monocytes Relative: 9 % (ref 3–12)
NEUTROS PCT: 68 % (ref 43–77)
Neutro Abs: 5.1 10*3/uL (ref 1.7–7.7)
Platelets: 246 10*3/uL (ref 150–400)
RBC: 3.65 MIL/uL — AB (ref 3.87–5.11)
RDW: 13.7 % (ref 11.5–15.5)
WBC: 7.5 10*3/uL (ref 4.0–10.5)

## 2013-10-12 NOTE — Progress Notes (Signed)
Patient ID: Catherine Simmons, female   DOB: 08/06/1985, 28 y.o.   MRN: [id number]       Patient ID: Catherine Simmons, female   DOB: Jan 03, 1986, 28 y.o.   MRN: [id number]  HPI   Outpatient Encounter Prescriptions as of 10/12/2013  Medication Sig  . acetaminophen (TYLENOL) 500 MG tablet Take 1,000 mg by mouth every 6 (six) hours as needed. Stomach pains  . amLODipine (NORVASC) 10 MG tablet TAKE 1 TABLET BY MOUTH DAILY  . darunavir-cobicistat (PREZCOBIX) 800-150 MG per tablet Take 1 tablet by mouth daily. Swallow whole. Do NOT crush, break or chew tablets. Take with food.  Marland Kitchen emtricitabine-tenofovir (TRUVADA) 200-300 MG per tablet Take 1 tablet by mouth daily.  . metoCLOPramide (REGLAN) 10 MG tablet Take 1 tablet (10 mg total) by mouth every 6 (six) hours.  . ondansetron (ZOFRAN ODT) 8 MG disintegrating tablet Take 1 tablet (8 mg total) by mouth every 8 (eight) hours as needed for nausea or vomiting.  . potassium chloride (K-DUR) 10 MEQ tablet Take 4 tablets (40 mEq total) by mouth daily. X 3 days only. Then use as instructed  . Prenatal Vit-Min-FA-Fish Oil ([facility] PRENATAL GUMMY PO) Take 1 tablet by mouth daily.  . promethazine (PHENERGAN) 25 MG tablet Take 1 tablet (25 mg total) by mouth every 6 (six) hours as needed for nausea or vomiting.     Patient Active Problem List   Diagnosis Date Noted  . Supervision of high-risk pregnancy 08/31/2013  . HTN in pregnancy, chronic 08/31/2013  . Hx of pelvic inflammatory disease 08/31/2013  . Anxiety 06/30/2013  . Human immunodeficiency virus (HIV) disease 10/12/2012  . Other disorder of menstruation and other abnormal bleeding from female genital tract 10/07/2012  . Pelvic pain 06/03/2012  . CARPAL TUNNEL SYNDROME 11/14/2009  . NUMMULAR ECZEMA 11/14/2009  . AMENORRHEA 06/25/2009  . OBESITY 05/09/2008  . SMOKER 05/09/2008  . DEPRESSION 05/09/2008     Health Maintenance Due  Topic Date Due  . Influenza Vaccine  09/09/2013     Review of  Systems  Physical Exam   BP 116/79  Pulse 80  Temp(Src) 98.8 F (37.1 C) (Oral)  Wt 215 lb (97.523 kg)  LMP 06/21/2013  Lab Results  Component Value Date   CD4TCELL 37 05/31/2013   Lab Results  Component Value Date   CD4TABS 830 05/31/2013   CD4TABS 940 02/27/2013   CD4TABS 810 01/12/2013   Lab Results  Component Value Date   HIV1RNAQUANT <20 05/31/2013   Lab Results  Component Value Date   HEPBSAB POS* 10/20/2012   No results found for this basename: RPR    CBC Lab Results  Component Value Date   WBC 5.3 08/31/2013   RBC 3.56* 08/31/2013   HGB 9.9* 08/31/2013   HCT 29.3* 08/31/2013   PLT 243 08/31/2013   MCV 82.3 08/31/2013   MCH 27.8 08/31/2013   MCHC 33.8 08/31/2013   RDW 13.8 08/31/2013   LYMPHSABS 1.5 08/31/2013   MONOABS 0.4 08/31/2013   EOSABS 0.1 08/31/2013   BASOSABS 0.0 08/31/2013   BMET Lab Results  Component Value Date   NA 135 09/05/2013   K 3.9 09/05/2013   CL 104 09/05/2013   CO2 24 09/05/2013   GLUCOSE 90 09/05/2013   BUN 7 09/05/2013   CREATININE 0.51 09/05/2013   CALCIUM 8.9 09/05/2013   GFRNONAA >89 08/22/2013   GFRAA >89 08/22/2013     Assessment and Plan   = check labs, maybe considered for enrollment  in clinical trial.  = flu vaccine today  = baby preparation - give referral for babylove.  Dental pain = will set up with clinic dental program

## 2013-10-13 LAB — HIV-1 RNA QUANT-NO REFLEX-BLD

## 2013-10-13 LAB — T-HELPER CELL (CD4) - (RCID CLINIC ONLY)
CD4 T CELL ABS: 600 /uL (ref 400–2700)
CD4 T CELL HELPER: 37 % (ref 33–55)

## 2013-10-17 ENCOUNTER — Other Ambulatory Visit: Payer: Self-pay | Admitting: Licensed Clinical Social Worker

## 2013-10-17 ENCOUNTER — Other Ambulatory Visit: Payer: Self-pay | Admitting: Internal Medicine

## 2013-10-17 MED ORDER — PROMETHAZINE HCL 25 MG PO TABS: 25.0000 mg | ORAL_TABLET | Freq: Four times a day (QID) | ORAL | Status: DC | PRN

## 2013-10-19 ENCOUNTER — Other Ambulatory Visit: Payer: Self-pay | Admitting: Family Medicine

## 2013-10-19 ENCOUNTER — Ambulatory Visit (INDEPENDENT_AMBULATORY_CARE_PROVIDER_SITE_OTHER): Payer: Medicaid Other | Admitting: Family Medicine

## 2013-10-19 VITALS — BP 110/62 | HR 78 | Temp 98.4°F | Wt 215.9 lb

## 2013-10-19 DIAGNOSIS — O9989 Other specified diseases and conditions complicating pregnancy, childbirth and the puerperium: Secondary | ICD-10-CM

## 2013-10-19 DIAGNOSIS — O98519 Other viral diseases complicating pregnancy, unspecified trimester: Secondary | ICD-10-CM

## 2013-10-19 DIAGNOSIS — O26892 Other specified pregnancy related conditions, second trimester: Secondary | ICD-10-CM

## 2013-10-19 DIAGNOSIS — B20 Human immunodeficiency virus [HIV] disease: Secondary | ICD-10-CM

## 2013-10-19 DIAGNOSIS — O0992 Supervision of high risk pregnancy, unspecified, second trimester: Secondary | ICD-10-CM

## 2013-10-19 DIAGNOSIS — N898 Other specified noninflammatory disorders of vagina: Secondary | ICD-10-CM

## 2013-10-19 LAB — POCT URINALYSIS DIP (DEVICE)
Bilirubin Urine: NEGATIVE
Glucose, UA: NEGATIVE mg/dL
Hgb urine dipstick: NEGATIVE
Ketones, ur: NEGATIVE mg/dL
Nitrite: NEGATIVE
Protein, ur: NEGATIVE mg/dL
Specific Gravity, Urine: 1.015 (ref 1.005–1.030)
Urobilinogen, UA: 1 mg/dL (ref 0.0–1.0)
pH: 6 (ref 5.0–8.0)

## 2013-10-19 NOTE — Progress Notes (Signed)
Reports occasional pelvic pressure and "stabbing pain" in vaginal area. C/o of white discharge with odor, denies itching or irritation.

## 2013-10-19 NOTE — Progress Notes (Signed)
Patient doing well.  Feeling movements.  Having copious thick white discharge with foul odor.  Wet prep and Gc/chlamydia obtained.  No bleeding, cramping, leaking fluid.

## 2013-10-20 LAB — GC/CHLAMYDIA PROBE AMP
CT Probe RNA: NEGATIVE
GC Probe RNA: NEGATIVE

## 2013-10-20 LAB — WET PREP, GENITAL
Clue Cells Wet Prep HPF POC: NONE SEEN
TRICH WET PREP: NONE SEEN
WBC, Wet Prep HPF POC: NONE SEEN
YEAST WET PREP: NONE SEEN

## 2013-10-26 ENCOUNTER — Ambulatory Visit (HOSPITAL_COMMUNITY)
Admission: RE | Admit: 2013-10-26 | Discharge: 2013-10-26 | Disposition: A | Discharge status: Home / Self Care | Payer: Medicaid Other | Source: Ambulatory Visit | Attending: Obstetrics and Gynecology | Admitting: Obstetrics and Gynecology

## 2013-10-26 DIAGNOSIS — O10912 Unspecified pre-existing hypertension complicating pregnancy, second trimester: Secondary | ICD-10-CM

## 2013-10-26 DIAGNOSIS — O10019 Pre-existing essential hypertension complicating pregnancy, unspecified trimester: Secondary | ICD-10-CM | POA: Diagnosis present

## 2013-10-26 DIAGNOSIS — O09899 Supervision of other high risk pregnancies, unspecified trimester: Secondary | ICD-10-CM | POA: Insufficient documentation

## 2013-10-26 DIAGNOSIS — O0992 Supervision of high risk pregnancy, unspecified, second trimester: Secondary | ICD-10-CM

## 2013-10-26 IMAGING — US US OB COMP +14 WK
1 series · 12 of 28 positions shown · non-contrast
Comparison: none

[Series 1: us ob comp +14 wk mfm · 90 acquisitions, 12 frames shown]
[im 4/90]
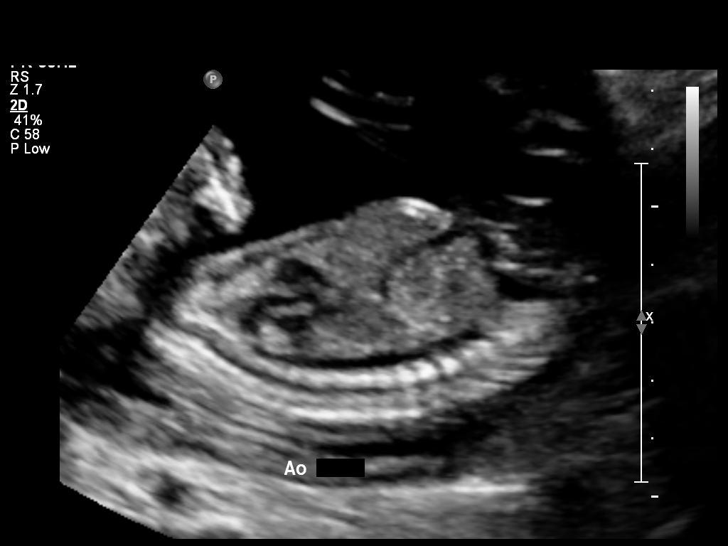
[im 10/90]
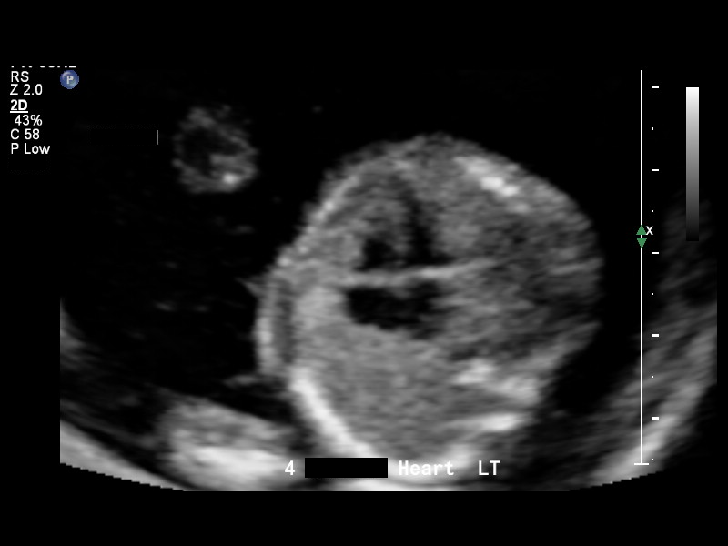
[im 17/90]
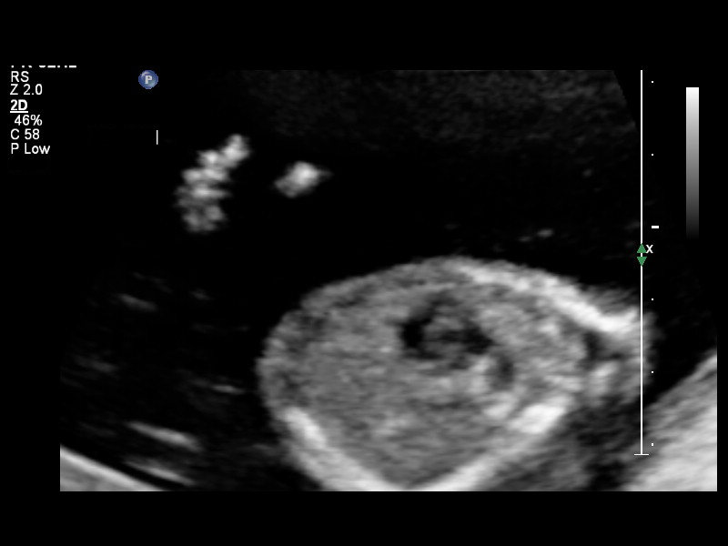
[im 27/90]
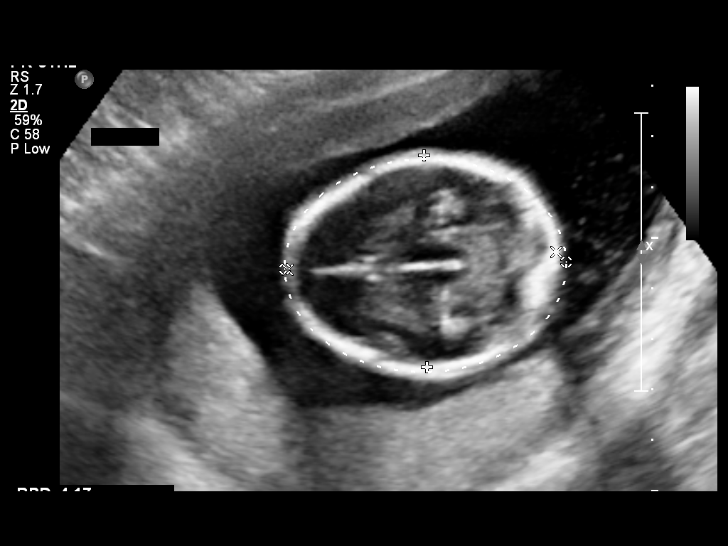
[im 33/90]
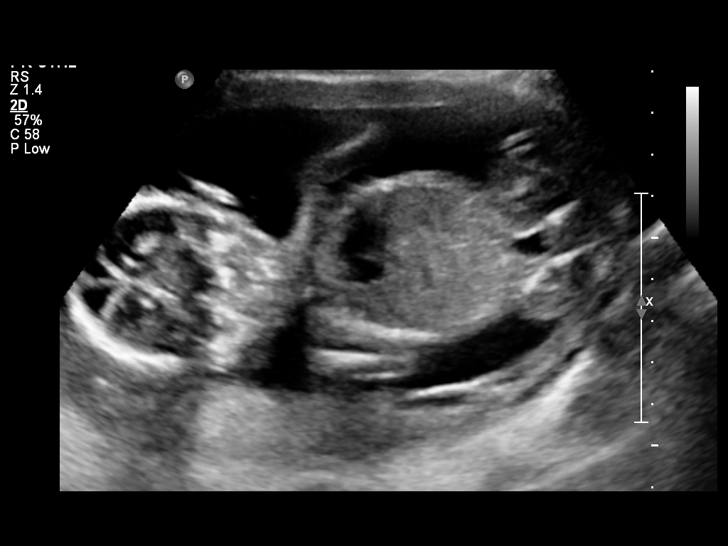
[im 40/90]
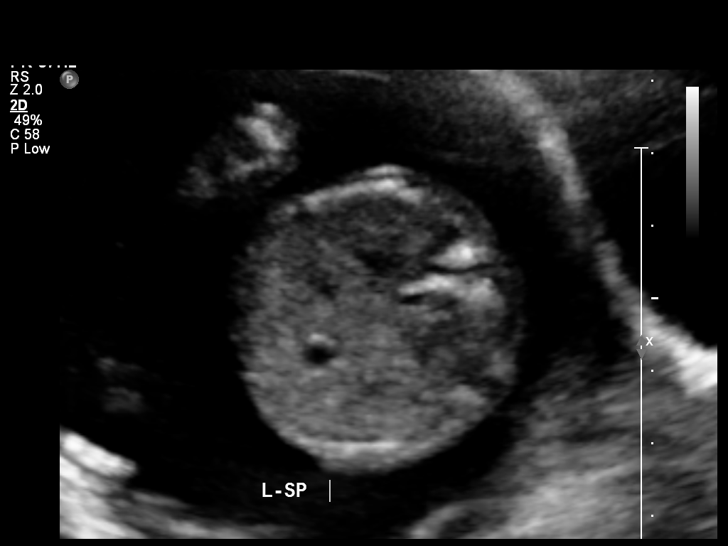
[im 50/90]
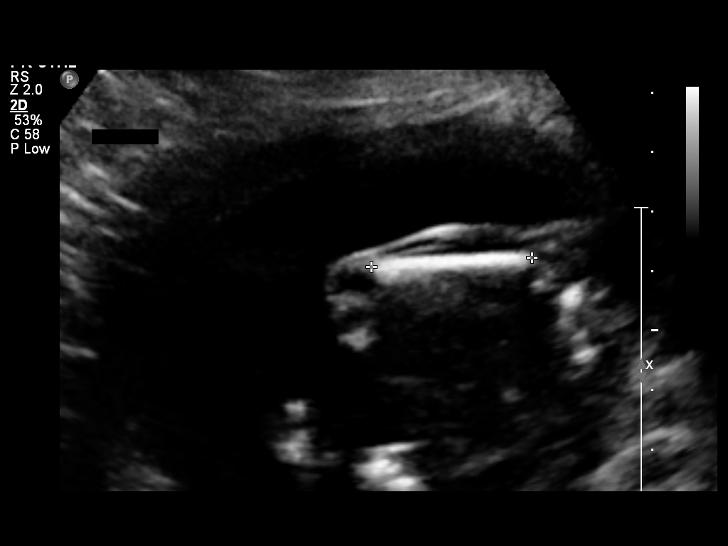
[im 57/90]
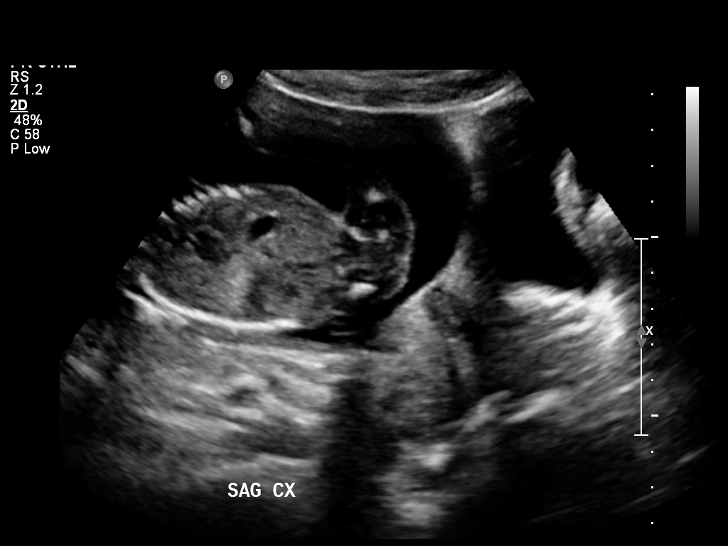
[im 63/90]
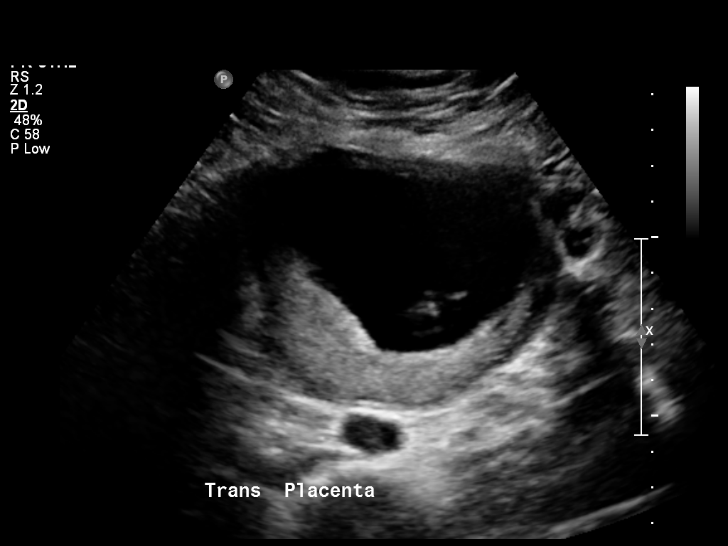
[im 73/90]
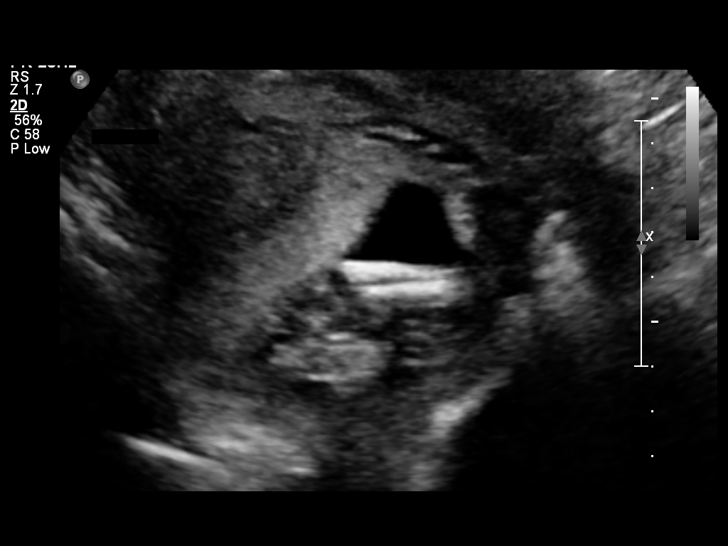
[im 80/90]
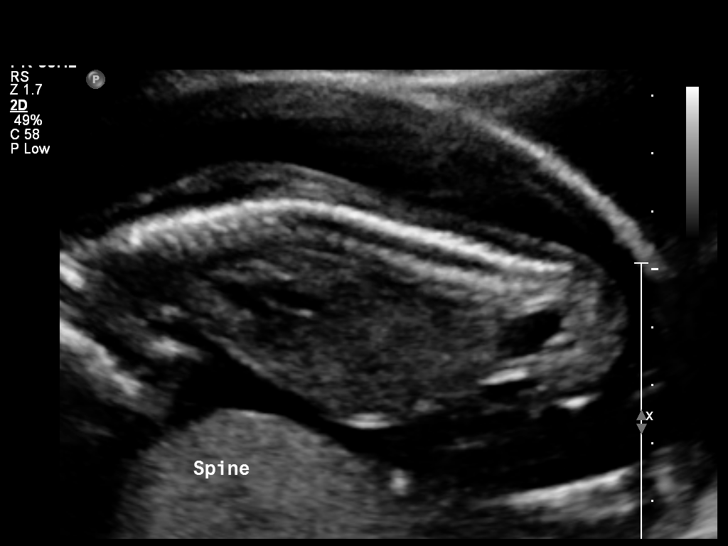
[im 86/90]
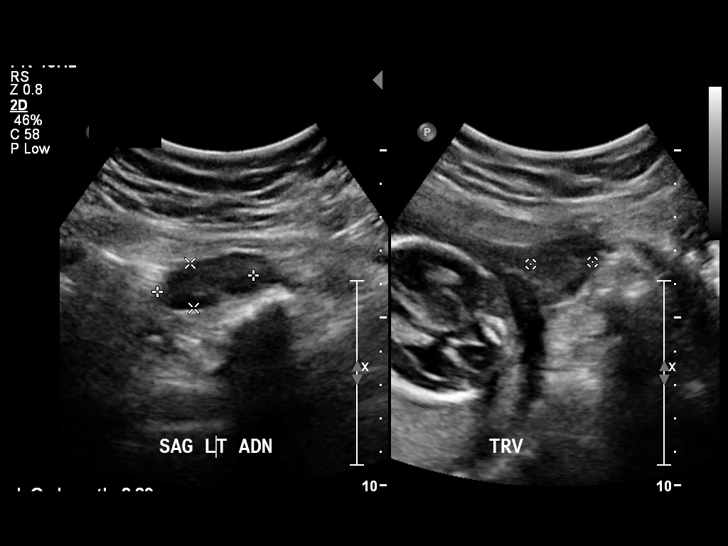

[12 of 28 positions shown; findings below may reference images not displayed]

OBSTETRICS REPORT
                      (Signed Final [DATE] [DATE])

Service(s) Provided

 US OB COMP + 14 WK                                    76805.1
Indications

 Basic anatomic survey                                 [Z3]
 HIV: Asymptomatic                                     647.63 V08
 Hypertension - Chronic/Pre-existing
Fetal Evaluation

 Num Of Fetuses:    1
 Preg. Location:    Intrauterine
 Fetal Heart Rate:  142                          bpm
 Cardiac Activity:  Observed
 Presentation:      Breech
 Placenta:          Posterior, above cervical
                    os
 P. Cord            Visualized, central
 Insertion:

 Amniotic Fluid
 AFI FV:      Subjectively within normal limits
                                             Larg Pckt:    5.09  cm
Biometry

 BPD:     41.7  mm     G. Age:  18w 4d                CI:         78.7   70 - 86
 OFD:       53  mm                                    FL/HC:      16.9   15.8 -
                                                                         18
 HC:     155.4  mm     G. Age:  18w 3d       59  %    HC/AC:      1.15   1.07 -

 AC:       135  mm     G. Age:  19w 0d       74  %    FL/BPD:
 FL:      26.3  mm     G. Age:  18w 0d       40  %    FL/AC:      19.5   20 - 24
 HUM:     25.5  mm     G. Age:  18w 0d       51  %
 CER:     18.4  mm     G. Age:  18w 1d       52  %
 NFT:     4.47  mm

 Est. FW:     243  gm      0 lb 9 oz     54  %
Gestational Age

 LMP:           18w 1d        Date:  [DATE]                 EDD:   [DATE]
 U/S Today:     18w 4d                                        EDD:   [DATE]
 Best:          18w 1d     Det. By:  LMP  ([DATE])          EDD:   [DATE]
Anatomy

 Cranium:          Appears normal         Aortic Arch:      Appears normal
 Fetal Cavum:      Appears normal         Ductal Arch:      Appears normal
 Ventricles:       Appears normal         Diaphragm:        Appears normal
 Choroid Plexus:   Appears normal         Stomach:          Appears normal, left
                                                            sided
 Cerebellum:       Appears normal         Abdomen:          Appears normal
 Posterior Fossa:  Appears normal         Abdominal Wall:   Appears nml (cord
                                                            insert, abd wall)
 Nuchal Fold:      Appears normal         Cord Vessels:     Appears normal (3
                                                            vessel cord)
 Face:             Appears normal         Kidneys:          Appear normal
                   (orbits and profile)
 Lips:             Appears normal         Bladder:          Appears normal
 Palate:           Not well visualized    Spine:            Appears normal
 Heart:            Appears normal         Lower             Appears normal
                   (4CH, axis, and        Extremities:
                   situs)
 RVOT:             Appears normal         Upper             Appears normal
                                          Extremities:
 LVOT:             Appears normal

 Other:  Fetus appears to be a male. Heels and 5th digit visualized.
Cervix Uterus Adnexa

 Cervical Length:    3.18     cm

 Cervix:       Normal appearance by transabdominal scan.
 Uterus:       No abnormality visualized.
 Cul De Sac:   No free fluid seen.
 Left Ovary:    Size(cm) L: 2.89 x W: 1.84 x H: 1.34  Volume(cc):
 Right Ovary:   Size(cm) L: 2.89 x W: 2 x H: 1.74  Volume(cc):

 Adnexa:     No adnexal mass visualized.
Impression

 SIUP at 18+1 weeks
 Normal detailed fetal anatomy
 Markers of aneuploidy: none
 Normal amniotic fluid volume
 Measurements consistent with LMP dating
Recommendations

 Follow-up as clinically indicated

## 2013-10-27 ENCOUNTER — Encounter: Payer: Self-pay | Admitting: Obstetrics and Gynecology

## 2013-11-09 ENCOUNTER — Encounter: Payer: Self-pay | Admitting: Internal Medicine

## 2013-11-09 ENCOUNTER — Ambulatory Visit (INDEPENDENT_AMBULATORY_CARE_PROVIDER_SITE_OTHER): Payer: Medicaid Other | Admitting: Internal Medicine

## 2013-11-09 VITALS — BP 114/76 | HR 87 | Temp 97.2°F | Wt 223.0 lb

## 2013-11-09 DIAGNOSIS — R110 Nausea: Secondary | ICD-10-CM

## 2013-11-09 MED ORDER — ONDANSETRON 8 MG PO TBDP: 8.0000 mg | ORAL_TABLET | Freq: Three times a day (TID) | ORAL | Status: DC | PRN

## 2013-11-09 NOTE — Progress Notes (Signed)
Patient ID: Catherine Simmons, female   DOB: 07/28/1985, 28 y.o.   MRN: [id number]       Patient ID: Catherine Simmons, female   DOB: 05/04/1985, 28 y.o.   MRN: [id number]  HPI 28yo F with HIV disease, pregnant into 2nd trimester, on truvada/prezcobix doing well with her hiv regimen. She is having recurrent nausea most noticeable around lunch time, independent of eating. She took phenergan without relief. CD 4 count of 600/VL<20 (early sep 2015). Will have upcoming visit in 2 wk to see if she qualifies for enrollment in clinical trial. U/S found she is having a baby boy.  Outpatient Encounter Prescriptions as of 11/09/2013  Medication Sig  . acetaminophen (TYLENOL) 500 MG tablet Take 1,000 mg by mouth every 6 (six) hours as needed. Stomach pains  . amLODipine (NORVASC) 10 MG tablet TAKE 1 TABLET BY MOUTH DAILY  . darunavir-cobicistat (PREZCOBIX) 800-150 MG per tablet Take 1 tablet by mouth daily. Swallow whole. Do NOT crush, break or chew tablets. Take with food.  Marland Kitchen. emtricitabine-tenofovir (TRUVADA) 200-300 MG per tablet Take 1 tablet by mouth daily.  . metoCLOPramide (REGLAN) 10 MG tablet Take 1 tablet (10 mg total) by mouth every 6 (six) hours.  . ondansetron (ZOFRAN-ODT) 8 MG disintegrating tablet DISSOLVE ONE TABLET BY MOUTH EVERY 8 HOURS AS NEEDED FOR NAUSEA/VOMITING(MAY USE 1/2 TO START THEN OTHER HALF IF NOT RELIEVED)  . potassium chloride (K-DUR) 10 MEQ tablet Take 4 tablets (40 mEq total) by mouth daily. X 3 days only. Then use as instructed  . Prenatal Vit-Min-FA-Fish Oil ([facility] PRENATAL GUMMY PO) Take 1 tablet by mouth daily.  . promethazine (PHENERGAN) 25 MG tablet Take 1 tablet (25 mg total) by mouth every 6 (six) hours as needed for nausea or vomiting.     Patient Active Problem List   Diagnosis Date Noted  . Supervision of high-risk pregnancy 08/31/2013  . HTN in pregnancy, chronic 08/31/2013  . Hx of pelvic inflammatory disease 08/31/2013  . Anxiety 06/30/2013  . Human  immunodeficiency virus (HIV) disease 10/12/2012  . Other disorder of menstruation and other abnormal bleeding from female genital tract 10/07/2012  . Pelvic pain 06/03/2012  . CARPAL TUNNEL SYNDROME 11/14/2009  . NUMMULAR ECZEMA 11/14/2009  . AMENORRHEA 06/25/2009  . OBESITY 05/09/2008  . SMOKER 05/09/2008  . DEPRESSION 05/09/2008     There are no preventive care reminders to display for this patient.   Review of Systems +nausea Physical Exam   BP 114/76  Pulse 87  Temp(Src) 97.2 F (36.2 C) (Oral)  Wt 223 lb (101.152 kg)  LMP 06/21/2013 Physical Exam  Constitutional: He is oriented to person, place, and time. appears well-developed and well-nourished. No distress.  HENT:  Mouth/Throat: Oropharynx is clear and moist. No oropharyngeal exudate.  Cardiovascular: Normal rate, regular rhythm and normal heart sounds. Exam reveals no gallop and no friction rub.  No murmur heard.  Pulmonary/Chest: Effort normal and breath sounds normal. No respiratory distress. H no wheezes.  Abdominal: Soft. Bowel sounds are normal. gravid   Lab Results  Component Value Date   CD4TCELL 37 10/12/2013   Lab Results  Component Value Date   CD4TABS 600 10/12/2013   CD4TABS 830 05/31/2013   CD4TABS 940 02/27/2013   Lab Results  Component Value Date   HIV1RNAQUANT <20 10/12/2013   Lab Results  Component Value Date   HEPBSAB POS* 10/20/2012   No results found for this basename: RPR    CBC Lab Results  Component Value  Date   WBC 7.5 10/12/2013   RBC 3.65* 10/12/2013   HGB 10.2* 10/12/2013   HCT 29.6* 10/12/2013   PLT 246 10/12/2013   MCV 81.1 10/12/2013   MCH 27.9 10/12/2013   MCHC 34.5 10/12/2013   RDW 13.7 10/12/2013   LYMPHSABS 1.7 10/12/2013   MONOABS 0.7 10/12/2013   EOSABS 0.1 10/12/2013   BASOSABS 0.0 10/12/2013   BMET Lab Results  Component Value Date   NA 134* 10/12/2013   K 3.9 10/12/2013   CL 105 10/12/2013   CO2 23 10/12/2013   GLUCOSE 73 10/12/2013   BUN 6 10/12/2013   CREATININE 0.51 10/12/2013    CALCIUM 8.3* 10/12/2013   GFRNONAA >89 10/12/2013   GFRAA >89 10/12/2013     Assessment and Plan  hiv = well controlled. Continue on truvada and prezcobix  Nausea = will prescribe zofran 8mg  ODT PRN #30 with 4 refills  Pregnancy = continue with prenatals and folic acid  Health miantenance = already had flu shot  rtc in 5 wk

## 2013-11-13 ENCOUNTER — Telehealth: Payer: Self-pay | Admitting: *Deleted

## 2013-11-13 NOTE — Telephone Encounter (Signed)
Patient called, [facility] having difficulty filling her medication (Prezcobix) via medicaid.  [facility], he will call medicaid and see what the issue might be.  He will contact patient and let her know.  RN advised it is especially important that she have this filled, as patient is [redacted] weeks pregnant. Andree CossHowell, Karla Pavone M, RN   Rob called back, was able to run the prescriptions through.  Will contact patient to see if she needs anything else filled.

## 2013-11-16 ENCOUNTER — Ambulatory Visit (INDEPENDENT_AMBULATORY_CARE_PROVIDER_SITE_OTHER): Payer: Medicaid Other | Admitting: Obstetrics & Gynecology

## 2013-11-16 VITALS — BP 127/63 | HR 78 | Temp 98.4°F | Wt 224.7 lb

## 2013-11-16 DIAGNOSIS — O10912 Unspecified pre-existing hypertension complicating pregnancy, second trimester: Secondary | ICD-10-CM

## 2013-11-16 DIAGNOSIS — O0992 Supervision of high risk pregnancy, unspecified, second trimester: Secondary | ICD-10-CM

## 2013-11-16 DIAGNOSIS — O98712 Human immunodeficiency virus [HIV] disease complicating pregnancy, second trimester: Secondary | ICD-10-CM

## 2013-11-16 DIAGNOSIS — O98719 Human immunodeficiency virus [HIV] disease complicating pregnancy, unspecified trimester: Secondary | ICD-10-CM | POA: Insufficient documentation

## 2013-11-16 DIAGNOSIS — B20 Human immunodeficiency virus [HIV] disease: Secondary | ICD-10-CM

## 2013-11-16 LAB — POCT URINALYSIS DIP (DEVICE)
Bilirubin Urine: NEGATIVE
Glucose, UA: NEGATIVE mg/dL
Hgb urine dipstick: NEGATIVE
Leukocytes, UA: NEGATIVE
NITRITE: NEGATIVE
PH: 7 (ref 5.0–8.0)
PROTEIN: NEGATIVE mg/dL
Specific Gravity, Urine: 1.015 (ref 1.005–1.030)
Urobilinogen, UA: 2 mg/dL — ABNORMAL HIGH (ref 0.0–1.0)

## 2013-11-16 NOTE — Progress Notes (Signed)
Reports intermittent pelvic pain-- "feels like stabbing pain, like he's kicking out of me."  Patient reports she has not been taking amlodipine-- BP WNL today.

## 2013-11-16 NOTE — Progress Notes (Signed)
10/1 VL <20. Candidate for vaginal delivery as long as VL <1000. Reassured about pelvic pain. Normal anatomy scan, quad screen today BTS papers signed today No other complaints or concerns.  Routine obstetric precautions reviewed.

## 2013-11-16 NOTE — Patient Instructions (Signed)
Return to clinic for any obstetric concerns or go to [facility] for evaluation  

## 2013-11-17 LAB — AFP, QUAD SCREEN
AFP: 68.9 ng/mL
Age Alone: 1:852 {titer}
Curr Gest Age: 21.1 wks.days
Down Syndrome Scr Risk Est: <1:38500 {titer}
HCG TOTAL: 12.69 [IU]/mL
INH: 138.3 pg/mL
INTERPRETATION-AFP: NEGATIVE
MOM FOR HCG: 0.84
MoM for AFP: 1.22
MoM for INH: 0.88
Open Spina bifida: NEGATIVE
TRI 18 SCR RISK EST: NEGATIVE
Trisomy 18 (Edward) Syndrome Interp.: 1:77500 {titer}
UE3 MOM: 1.26
UE3 VALUE: 2.49 ng/mL

## 2013-11-20 ENCOUNTER — Encounter: Payer: Self-pay | Admitting: Obstetrics & Gynecology

## 2013-11-22 ENCOUNTER — Ambulatory Visit (INDEPENDENT_AMBULATORY_CARE_PROVIDER_SITE_OTHER): Payer: Medicaid Other | Admitting: *Deleted

## 2013-11-22 ENCOUNTER — Ambulatory Visit (INDEPENDENT_AMBULATORY_CARE_PROVIDER_SITE_OTHER): Payer: Medicaid Other | Admitting: Infectious Disease

## 2013-11-22 ENCOUNTER — Encounter: Payer: Self-pay | Admitting: *Deleted

## 2013-11-22 VITALS — BP 120/81 | HR 78 | Temp 98.2°F | Resp 16 | Wt 227.8 lb

## 2013-11-22 VITALS — BP 120/81 | HR 78 | Temp 98.2°F | Wt 227.8 lb

## 2013-11-22 DIAGNOSIS — Z006 Encounter for examination for normal comparison and control in clinical research program: Secondary | ICD-10-CM

## 2013-11-22 DIAGNOSIS — B20 Human immunodeficiency virus [HIV] disease: Secondary | ICD-10-CM

## 2013-11-22 DIAGNOSIS — O98719 Human immunodeficiency virus [HIV] disease complicating pregnancy, unspecified trimester: Secondary | ICD-10-CM

## 2013-11-22 DIAGNOSIS — R110 Nausea: Secondary | ICD-10-CM

## 2013-11-22 NOTE — Progress Notes (Signed)
Catherine Simmons is here to screen for the [id number] Study. Informed consent was obtained after she had a chance to review the consent and we discussed it in detail. We reviewed the risks associated with the study and she understands that there is not much information available yet on the use of Catherine Simmons in pregnancy. She understands that the drug Catherine Simmons will be provided from the study and she will need to get her Catherine Simmons elsewhere. She is all ready on that regimen and started it in July of this year. She had previously been on Triumeq and that was stopped when she saw Dr. Drue Simmons in June and was switched to Catherine Simmons, Catherine Simmons and Catherine Simmons. She developed moderate nausea and vomiting while on that regimen and was changed to Catherine Simmons in July and is tolerating that much better. She still has occasional nausea which she takes zofran for prn. She also takes normodyne for BP and prenatal gummi vitamins. She has been pregnant twice before and delivered healthy babies and had no complications during those pregnancies. She has been fasting since last night at 10pm and labs were drawn for study at 9:55am. She had a fetal ultrasound on 10/26/13 which was normal and an OB exam on 10/8. She will see Catherine Simmons today for the screening physical.

## 2013-11-22 NOTE — Progress Notes (Signed)
Subjective:    Patient ID: Catherine Simmons, female    DOB: 08/15/1985, 28 y.o.   MRN: [id number]  HPI  Catherine Simmons is a 28 y.o. female with HIV infection who is pregnant and currently o PREZCOBIX AND Truvada with an undetectable viral load and healthy CD4 count.  Lab Results  Component Value Date   HIV1RNAQUANT <20 10/12/2013   Lab Results  Component Value Date   CD4TABS 600 10/12/2013   CD4TABS 830 05/31/2013   CD4TABS 940 02/27/2013   She has a little nausea during this pregnancy but is much improved currently the nausea is in the morning and response to Zofran she has no nausea while taking her anti-retro-viral syndrome the evening.  There is no vomiting. She presents to clinic for a physical exam by me to make sure she is eligible for a moment into a [facility] PREGNANCY STUDY OF PREZCOBIX.     Review of Systems  Constitutional: Negative for fever, chills, diaphoresis, activity change, appetite change, fatigue and unexpected weight change.  HENT: Negative for congestion, rhinorrhea, sinus pressure, sneezing, sore throat and trouble swallowing.   Eyes: Negative for photophobia and visual disturbance.  Respiratory: Negative for cough, chest tightness, shortness of breath, wheezing and stridor.   Cardiovascular: Negative for chest pain, palpitations and leg swelling.  Gastrointestinal: Positive for nausea. Negative for vomiting, abdominal pain, diarrhea, constipation, blood in stool, abdominal distention and anal bleeding.  Genitourinary: Negative for dysuria, hematuria, flank pain and difficulty urinating.  Musculoskeletal: Negative for arthralgias, back pain, gait problem, joint swelling and myalgias.  Skin: Negative for color change, pallor, rash and wound.  Neurological: Negative for dizziness, tremors, weakness and light-headedness.  Hematological: Negative for adenopathy. Does not bruise/bleed easily.  Psychiatric/Behavioral: Negative for behavioral problems, confusion,  sleep disturbance, dysphoric mood, decreased concentration and agitation.       Objective:   Physical Exam  Constitutional: She is oriented to person, place, and time. She appears well-developed and well-nourished. No distress.  HENT:  Head: Normocephalic and atraumatic.  Right Ear: Hearing and external ear normal.  Left Ear: Hearing and external ear normal.  Nose: Nose normal.  Mouth/Throat: Uvula is midline and oropharynx is clear and moist. Normal dentition. No oropharyngeal exudate.    Eyes: Conjunctivae and EOM are normal. Pupils are equal, round, and reactive to light. No scleral icterus.  Neck: Normal range of motion. Neck supple. No JVD present.  Cardiovascular: Normal rate, regular rhythm and normal heart sounds.  Exam reveals no gallop and no friction rub.   No murmur heard. Pulmonary/Chest: Effort normal and breath sounds normal. No respiratory distress. She has no wheezes. She has no rales. She exhibits no tenderness.  Abdominal: Soft. There is no tenderness. There is no rebound and no guarding.  Genitourinary: Vagina normal.    There is no rash on the right labia. There is no rash on the left labia.  Musculoskeletal: She exhibits no edema and no tenderness.  Lymphadenopathy:    She has no cervical adenopathy.    She has no axillary adenopathy.  Neurological: She is alert and oriented to person, place, and time. She exhibits normal muscle tone. Coordination normal.  Skin: Skin is warm, dry and intact. No abrasion, no bruising, no burn, no ecchymosis, no laceration, no lesion, no petechiae and no rash noted. She is not diaphoretic. No cyanosis or erythema. No pallor. Nails show no clubbing.     Psychiatric: She has a normal mood and affect. Her behavior is  normal. Judgment and thought content normal.          Assessment & Plan:   HIV: Perfect control on current regimen and seems to be eligible for a moment into the [place] pregnancy study.  High-risk pregnancy  colon should be followed closely both by ourselves obstetrics and by research team as part of the [place] pregnancy study if she is indeed eligible as I believe she should be. Will need to coordinate with obstetrics regarding obtaining cord blood at the time of delivery as this will need to be collected special viral for the drug company.

## 2013-12-04 ENCOUNTER — Encounter: Payer: Self-pay | Admitting: *Deleted

## 2013-12-08 ENCOUNTER — Ambulatory Visit (INDEPENDENT_AMBULATORY_CARE_PROVIDER_SITE_OTHER): Payer: Medicaid Other | Admitting: *Deleted

## 2013-12-08 VITALS — BP 111/76 | HR 71 | Temp 98.2°F | Resp 16 | Wt 232.8 lb

## 2013-12-08 DIAGNOSIS — B20 Human immunodeficiency virus [HIV] disease: Secondary | ICD-10-CM

## 2013-12-08 DIAGNOSIS — Z006 Encounter for examination for normal comparison and control in clinical research program: Secondary | ICD-10-CM

## 2013-12-08 NOTE — Progress Notes (Signed)
Catherine Simmons is here today for the [id number]. She denies any new problems or concerns. She still continues to have occasional nausea for which she takes zofran for. She still wishes to continue with the study and the 2nd trimester PK visit is planned for November 18th at which time she will be [redacted] weeks along in her pregnancy. She was given a bottle of Prexcobix and told to start using that bottle today and continue her Truvada from home. She was also given the medication diary and knows to keep the time and date of her doses documented until she comes back. She was already on Prezcobix and truvada and has not noticed any side effects from the regimen. She was given the safety contact card with phone numbers to contact us in case of emergency or when she goes in labor. She was instructed to bring the bottle of Prezcobix back with any unused medication and to be fasting before that visit. She normally takes her [facility] at 9pm, so we will find out when she needs to start taking them earlier in the day prior to the next visit.

## 2013-12-11 ENCOUNTER — Encounter: Payer: Self-pay | Admitting: *Deleted

## 2013-12-14 ENCOUNTER — Ambulatory Visit (INDEPENDENT_AMBULATORY_CARE_PROVIDER_SITE_OTHER): Payer: Medicaid Other | Admitting: Internal Medicine

## 2013-12-14 ENCOUNTER — Ambulatory Visit (INDEPENDENT_AMBULATORY_CARE_PROVIDER_SITE_OTHER): Payer: Medicaid Other | Admitting: Family

## 2013-12-14 VITALS — BP 129/81 | HR 100 | Wt 233.0 lb

## 2013-12-14 VITALS — BP 120/77 | HR 81 | Temp 98.2°F | Wt 234.2 lb

## 2013-12-14 DIAGNOSIS — R110 Nausea: Secondary | ICD-10-CM

## 2013-12-14 DIAGNOSIS — B20 Human immunodeficiency virus [HIV] disease: Secondary | ICD-10-CM

## 2013-12-14 DIAGNOSIS — O10912 Unspecified pre-existing hypertension complicating pregnancy, second trimester: Secondary | ICD-10-CM

## 2013-12-14 DIAGNOSIS — O98712 Human immunodeficiency virus [HIV] disease complicating pregnancy, second trimester: Secondary | ICD-10-CM

## 2013-12-14 DIAGNOSIS — Z006 Encounter for examination for normal comparison and control in clinical research program: Secondary | ICD-10-CM

## 2013-12-14 DIAGNOSIS — O98719 Human immunodeficiency virus [HIV] disease complicating pregnancy, unspecified trimester: Secondary | ICD-10-CM

## 2013-12-14 LAB — POCT URINALYSIS DIP (DEVICE)
Bilirubin Urine: NEGATIVE
Glucose, UA: NEGATIVE mg/dL
Hgb urine dipstick: NEGATIVE
Ketones, ur: NEGATIVE mg/dL
Nitrite: NEGATIVE
PROTEIN: NEGATIVE mg/dL
SPECIFIC GRAVITY, URINE: 1.015 (ref 1.005–1.030)
UROBILINOGEN UA: 0.2 mg/dL (ref 0.0–1.0)
pH: 6 (ref 5.0–8.0)

## 2013-12-14 MED ORDER — INTEGRA F 125-1 MG PO CAPS: 1.0000 | ORAL_CAPSULE | Freq: Every day | ORAL | Status: DC

## 2013-12-14 NOTE — Progress Notes (Signed)
Reports white vaginal discharge with odor; wet prep collected.  Exam - no odor noted, small amount of white dc.  Growth ultrasound scheduled at 28 weeks.  Updated patient medication.  Not taking Norvasc as indicated.  Reports not taking in 2 months.

## 2013-12-14 NOTE — Progress Notes (Signed)
Patient ID: Catherine Simmons, female   DOB: 12/21/1985, 28 y.o.   MRN: [id number]       Patient ID: Catherine Simmons, female   DOB: 02/27/1985, 28 y.o.   MRN: [id number]  HPI 28 yo F with CD 4 count of 600/VL<20 on truvada-prezcobix, in her 2nd trimester of pregnancy. Nausea is well controlled with zofran. She is doing much better than earlier in her pregnancy. No contractions, no vaginal bleeding. She states that she is mostly fatigued. No recent illnesses. Getting repeat ultrasound in a few weeks in addition to oral glucose challenge. She sees research team next week.  Outpatient Encounter Prescriptions as of 12/14/2013  Medication Sig  . acetaminophen (TYLENOL) 500 MG tablet Take 1,000 mg by mouth every 6 (six) hours as needed. Stomach pains  . amLODipine (NORVASC) 10 MG tablet TAKE 1 TABLET BY MOUTH DAILY  . darunavir-cobicistat (PREZCOBIX) 800-150 MG per tablet Take 1 tablet by mouth daily. Swallow whole. Do NOT crush, break or chew tablets. Take with food.  Marland Kitchen. emtricitabine-tenofovir (TRUVADA) 200-300 MG per tablet Take 1 tablet by mouth daily.  . Fe Fum-FePoly-FA-Vit C-Vit B3 (INTEGRA F) 125-1 MG CAPS Take 1 capsule by mouth daily.  . ondansetron (ZOFRAN ODT) 8 MG disintegrating tablet Take 1 tablet (8 mg total) by mouth every 8 (eight) hours as needed for nausea or vomiting.  . Prenatal Vit-Min-FA-Fish Oil ([employer] PRENATAL GUMMY PO) Take 1 tablet by mouth daily.  . [DISCONTINUED] metoCLOPramide (REGLAN) 10 MG tablet Take 1 tablet (10 mg total) by mouth every 6 (six) hours.  . [DISCONTINUED] potassium chloride (K-DUR) 10 MEQ tablet Take 4 tablets (40 mEq total) by mouth daily. X 3 days only. Then use as instructed  . [DISCONTINUED] promethazine (PHENERGAN) 25 MG tablet Take 1 tablet (25 mg total) by mouth every 6 (six) hours as needed for nausea or vomiting.     Patient Active Problem List   Diagnosis Date Noted  . HIV disease affecting pregnancy, antepartum 11/16/2013  . Supervision of  high-risk pregnancy 08/31/2013  . HTN in pregnancy, chronic 08/31/2013  . Hx of pelvic inflammatory disease 08/31/2013  . Anxiety 06/30/2013  . Human immunodeficiency virus (HIV) disease 10/12/2012  . Other disorder of menstruation and other abnormal bleeding from female genital tract 10/07/2012  . Pelvic pain 06/03/2012  . CARPAL TUNNEL SYNDROME 11/14/2009  . NUMMULAR ECZEMA 11/14/2009  . AMENORRHEA 06/25/2009  . OBESITY 05/09/2008  . SMOKER 05/09/2008  . DEPRESSION 05/09/2008     There are no preventive care reminders to display for this patient.   Review of Systems  Constitutional: Negative for fever, chills, diaphoresis, activity change, appetite change, fatigue and unexpected weight change.  HENT: Negative for congestion, sore throat, rhinorrhea, sneezing, trouble swallowing and sinus pressure.  Eyes: Negative for photophobia and visual disturbance.  Respiratory: Negative for cough, chest tightness, shortness of breath, wheezing and stridor.  Cardiovascular: Negative for chest pain, palpitations and leg swelling.  Gastrointestinal: Negative for nausea, vomiting, abdominal pain, diarrhea, constipation, blood in stool, abdominal distention and anal bleeding.  Genitourinary: Negative for dysuria, hematuria, flank pain and difficulty urinating.  Musculoskeletal: Negative for myalgias, back pain, joint swelling, arthralgias and gait problem.  Skin: Negative for color change, pallor, rash and wound.  Neurological: Negative for dizziness, tremors, weakness and light-headedness.  Hematological: Negative for adenopathy. Does not bruise/bleed easily.  Psychiatric/Behavioral: Negative for behavioral problems, confusion, sleep disturbance, dysphoric mood, decreased concentration and agitation.    Physical Exam   BP 120/77 mmHg  Pulse 81  Temp(Src) 98.2 F (36.8 C) (Oral)  Wt 234 lb 4 oz (106.255 kg)  LMP 06/21/2013 Physical Exam  Constitutional:  oriented to person, place, and  time. appears well-developed and well-nourished. No distress.  HENT:  Mouth/Throat: Oropharynx is clear and moist. No oropharyngeal exudate.  Cardiovascular: Normal rate, regular rhythm and normal heart sounds. Exam reveals no gallop and no friction rub.  No murmur heard.  Pulmonary/Chest: Effort normal and breath sounds normal. No respiratory distress.  has no wheezes.  Abdominal: Soft. Bowel sounds are normal.  exhibits no distension. There is no tenderness. Gravid abdomen Lymphadenopathy: no cervical adenopathy.  Skin: Skin is warm and dry. No rash noted. No erythema.    Lab Results  Component Value Date   CD4TCELL 37 10/12/2013   Lab Results  Component Value Date   CD4TABS 600 10/12/2013   CD4TABS 830 05/31/2013   CD4TABS 940 02/27/2013   Lab Results  Component Value Date   HIV1RNAQUANT <20 10/12/2013   Lab Results  Component Value Date   HEPBSAB POS* 10/20/2012   No results found for: RPR  CBC Lab Results  Component Value Date   WBC 7.5 10/12/2013   RBC 3.65* 10/12/2013   HGB 10.2* 10/12/2013   HCT 29.6* 10/12/2013   PLT 246 10/12/2013   MCV 81.1 10/12/2013   MCH 27.9 10/12/2013   MCHC 34.5 10/12/2013   RDW 13.7 10/12/2013   LYMPHSABS 1.7 10/12/2013   MONOABS 0.7 10/12/2013   EOSABS 0.1 10/12/2013   BASOSABS 0.0 10/12/2013   BMET Lab Results  Component Value Date   NA 134* 10/12/2013   K 3.9 10/12/2013   CL 105 10/12/2013   CO2 23 10/12/2013   GLUCOSE 73 10/12/2013   BUN 6 10/12/2013   CREATININE 0.51 10/12/2013   CALCIUM 8.3* 10/12/2013   GFRNONAA >89 10/12/2013   GFRAA >89 10/12/2013     Assessment and Plan  hiv = well controlled, continue on truvada-prezcobix. Likely to have viral load checked next week with clinical trial  Nausea = continue on prn zofran  Fatigue = likely related to pregnancy  Pregnancy = continue with prenatal vitamins and hiv medications and iron  htn = currently on amlodipine, will defer to ob to see if she needs  to change agents  rtc in 5-6 wk

## 2013-12-14 NOTE — Progress Notes (Signed)
Pt reports discharge with odor.

## 2013-12-14 NOTE — Progress Notes (Signed)
Follow-up growth U/S with Radiology 01/03/14 @ 915a.

## 2013-12-15 ENCOUNTER — Other Ambulatory Visit: Payer: Self-pay | Admitting: Family

## 2013-12-15 LAB — WET PREP, GENITAL
Trich, Wet Prep: NONE SEEN
YEAST WET PREP: NONE SEEN

## 2013-12-15 MED ORDER — METRONIDAZOLE 500 MG PO TABS: 500.0000 mg | ORAL_TABLET | Freq: Two times a day (BID) | ORAL | Status: DC

## 2013-12-15 NOTE — Progress Notes (Signed)
Left message on patient voice mail to return call.  Flagyl called to pharmacy for BV.

## 2013-12-18 ENCOUNTER — Telehealth: Payer: Self-pay | Admitting: General Practice

## 2013-12-18 DIAGNOSIS — N760 Acute vaginitis: Principal | ICD-10-CM

## 2013-12-18 DIAGNOSIS — B9689 Other specified bacterial agents as the cause of diseases classified elsewhere: Secondary | ICD-10-CM

## 2013-12-18 MED ORDER — METRONIDAZOLE 0.75 % VA GEL: 1.0000 | Freq: Two times a day (BID) | VAGINAL | Status: DC

## 2013-12-18 NOTE — Telephone Encounter (Signed)
Patient called and left message stating she received a call from us and she would like her test results. Called patient back and informed her of BV and that a medication has already been sent to her pharmacy to treat this. Patient verbalized understanding and states she isn't good with pills and wants to know if we can send the gel in. Told patient I will put that Rx in. Patient verbalized understanding and had no other questions

## 2013-12-20 ENCOUNTER — Encounter: Payer: Self-pay | Admitting: *Deleted

## 2013-12-26 ENCOUNTER — Encounter: Payer: Self-pay | Admitting: Internal Medicine

## 2013-12-26 LAB — CD4/CD8 (T-HELPER/T-SUPPRESSOR CELL)
CD4%: 39.3
CD4: 701
CD8 % Suppressor T Cell: 29.3
CD8: 524

## 2013-12-26 LAB — HEMOGLOBIN: HGB: 9.3 g/dL

## 2013-12-26 LAB — HEMATOCRIT, BODY FLUID: HCT: 28 %

## 2013-12-26 LAB — HIV-1 RNA QUANT-NO REFLEX-BLD: HIV 1 RNA VIRAL LOAD: 20

## 2013-12-27 ENCOUNTER — Ambulatory Visit (INDEPENDENT_AMBULATORY_CARE_PROVIDER_SITE_OTHER): Payer: Medicaid Other | Admitting: *Deleted

## 2013-12-27 VITALS — BP 115/76 | HR 99 | Temp 98.2°F | Resp 16 | Wt 235.5 lb

## 2013-12-27 DIAGNOSIS — B20 Human immunodeficiency virus [HIV] disease: Secondary | ICD-10-CM

## 2013-12-27 DIAGNOSIS — Z006 Encounter for examination for normal comparison and control in clinical research program: Secondary | ICD-10-CM

## 2013-12-27 DIAGNOSIS — Z21 Asymptomatic human immunodeficiency virus [HIV] infection status: Secondary | ICD-10-CM

## 2013-12-27 NOTE — Progress Notes (Signed)
Catherine Simmons is here for her 2nd trimester PK visit. She denies any new problems except for some white vaginal discharge. She was diagnosed with bacterial vaginosis by her OB clinician and was prescribed metrogel vaginal gel. She says she hasn't used it yet because she was afraid it may affect this study visit. I told her it was fine to go ahead and use it and she plans to tomorrow. She continues to have some nausea and takes zofran once or twice a day as needed. She ate last at 8:30 pm and has been fasting since. We drew her regular study labs first at 8am and then let her eat breakfast, 2 ham sandwiches with 200 ml of water. Predose PK lab was drawn after she finished eating at 8:19am. She took her prezcobix and truvada immediately afterward at 8:20am. We then began the q hour PK draws through 12:20pm. Lunch was provided for her by the research team to include a subway tuna sub and doritoes. A saline lock was put in the patient's hand for ease of blood draws. Each blood draw was preceded by drawing off appr. 3 cc of blood first and discarding it and then getting the sample. The line was then flushed with 5 cc NS. She returned her medication diary and her pill bottle with 10 prezcobix and was given 2 new bottles to take starting tomorrow.This morning's labs drawn through predose will be sent out today and remaining frozen PK samples will be sent tomorrow with the 24 hr sample. The last pickup from [employer] here today is at 2pm, so it necessitated batching the other PK samples for tomorrow's shipment. At the 5:20pm blood draw, her saline well was clotted so we removed it and then drew the specimens as regular blood draws. After the 8:20pm blood draw, she will return for the 24 hr PK sample in the morning at 8:20am.

## 2013-12-28 ENCOUNTER — Ambulatory Visit: Payer: Medicaid Other | Admitting: *Deleted

## 2013-12-28 DIAGNOSIS — Z006 Encounter for examination for normal comparison and control in clinical research program: Secondary | ICD-10-CM

## 2013-12-28 DIAGNOSIS — Z21 Asymptomatic human immunodeficiency virus [HIV] infection status: Secondary | ICD-10-CM

## 2013-12-28 NOTE — Progress Notes (Signed)
Catherine Simmons came in this morning for the 24 hr PK draw which was done at 8:18am. We discussed her need to take iron supplementation and she was given some 65 mg tablets to take. She was already prescribed prenatal supplementation for iron which she had not gotten filled. She will return on January 11th for the 2nd PK visit.

## 2014-01-03 ENCOUNTER — Ambulatory Visit (INDEPENDENT_AMBULATORY_CARE_PROVIDER_SITE_OTHER): Payer: Medicaid Other | Admitting: Family

## 2014-01-03 ENCOUNTER — Ambulatory Visit (HOSPITAL_COMMUNITY)
Admission: RE | Admit: 2014-01-03 | Discharge: 2014-01-03 | Disposition: A | Discharge status: Home / Self Care | Payer: Medicaid Other | Source: Ambulatory Visit | Attending: Family | Admitting: Family

## 2014-01-03 VITALS — BP 131/73 | HR 88 | Wt 234.6 lb

## 2014-01-03 DIAGNOSIS — O98712 Human immunodeficiency virus [HIV] disease complicating pregnancy, second trimester: Secondary | ICD-10-CM | POA: Insufficient documentation

## 2014-01-03 DIAGNOSIS — Z3A00 Weeks of gestation of pregnancy not specified: Secondary | ICD-10-CM | POA: Insufficient documentation

## 2014-01-03 DIAGNOSIS — O0992 Supervision of high risk pregnancy, unspecified, second trimester: Secondary | ICD-10-CM

## 2014-01-03 LAB — POCT URINALYSIS DIP (DEVICE)
Bilirubin Urine: NEGATIVE
GLUCOSE, UA: NEGATIVE mg/dL
Hgb urine dipstick: NEGATIVE
Ketones, ur: NEGATIVE mg/dL
Leukocytes, UA: NEGATIVE
NITRITE: NEGATIVE
Protein, ur: NEGATIVE mg/dL
Specific Gravity, Urine: 1.015 (ref 1.005–1.030)
UROBILINOGEN UA: 0.2 mg/dL (ref 0.0–1.0)
pH: 5.5 (ref 5.0–8.0)

## 2014-01-03 IMAGING — US US OB FOLLOW-UP
1 series · 12 of 28 positions shown · non-contrast
Comparison: none

[Series 1: us ob follow-up · 0.20mm/px · 53 acquisitions, 12 frames shown]
[im 2/53]
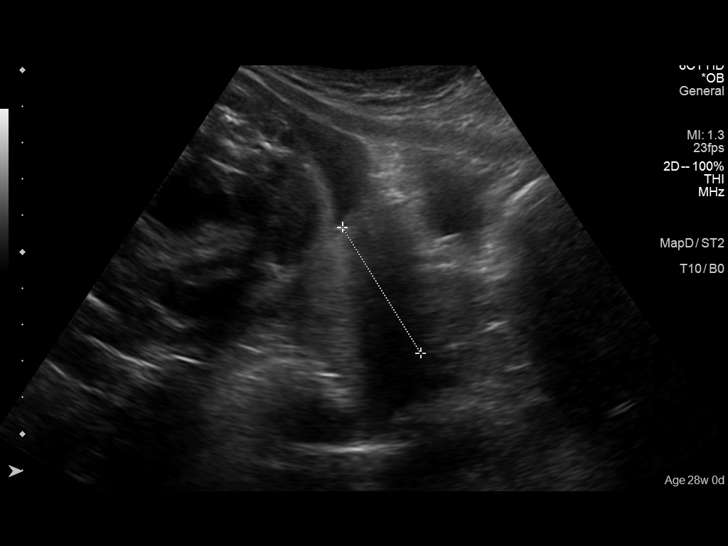
[im 6/53]
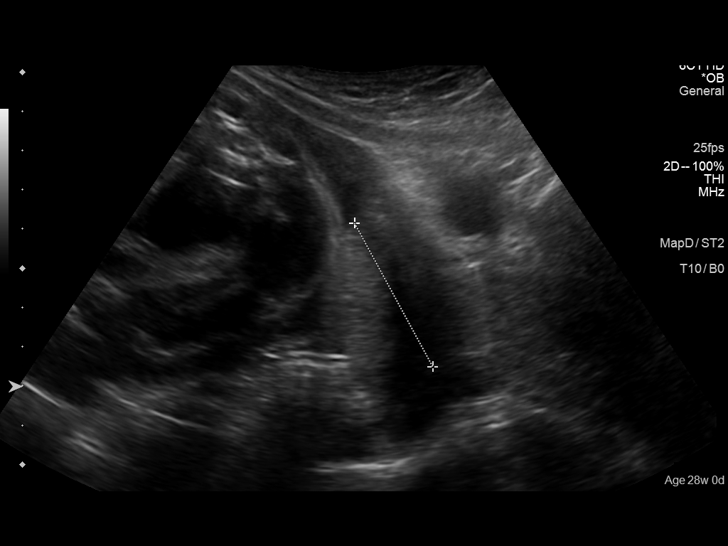
[im 10/53]
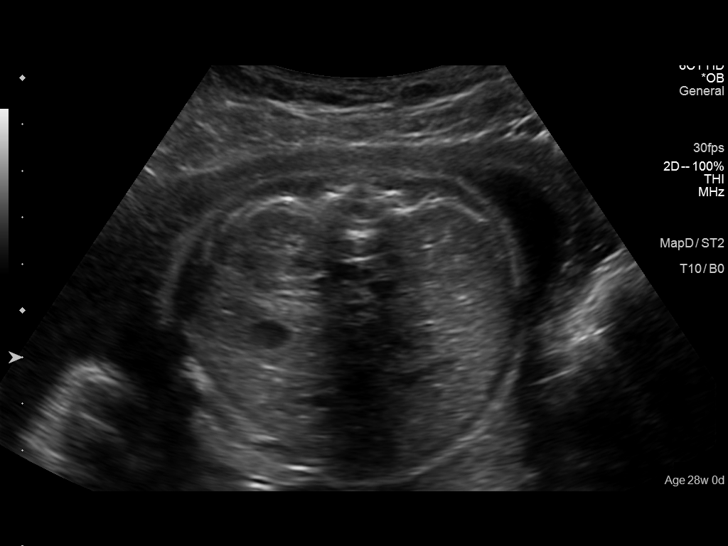
[im 16/53]
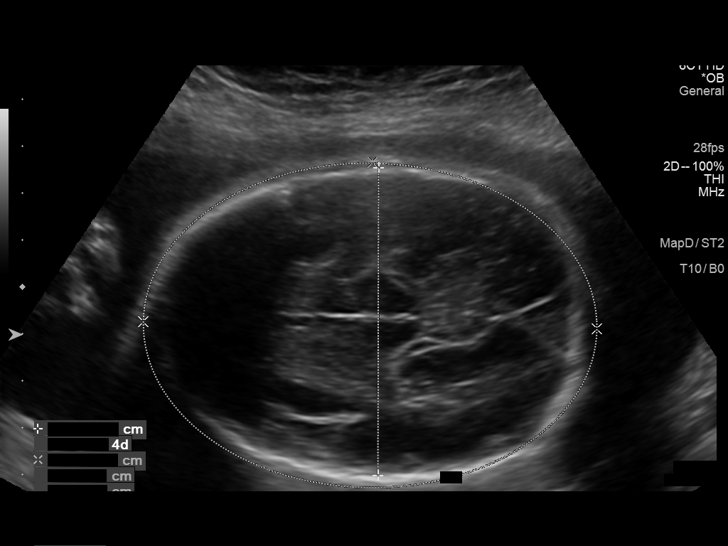
[im 20/53]
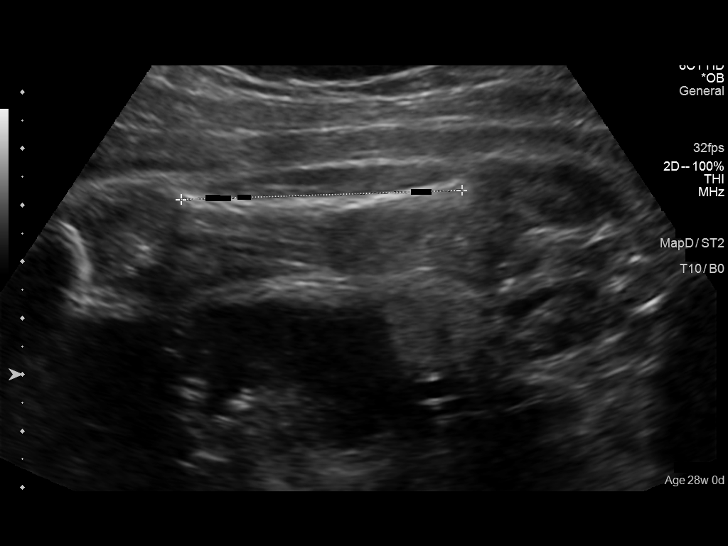
[im 24/53]
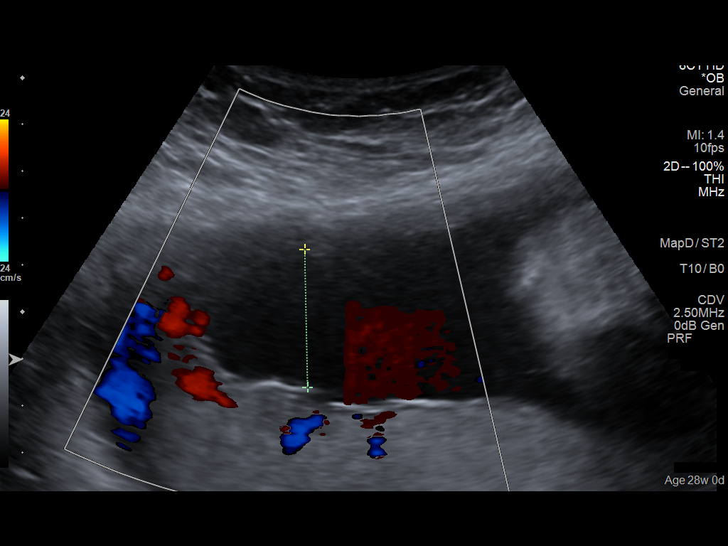
[im 29/53]
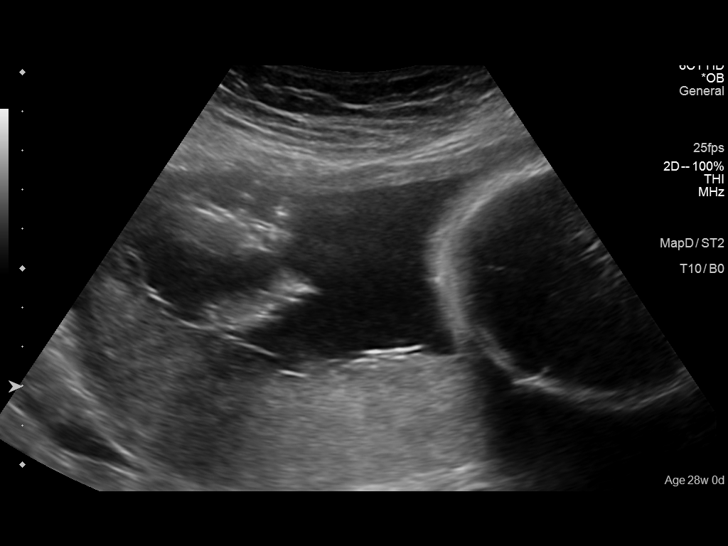
[im 33/53]
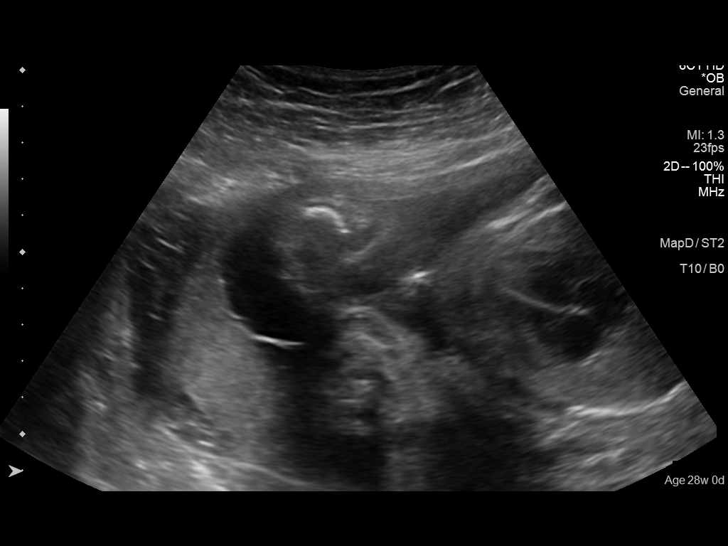
[im 37/53]
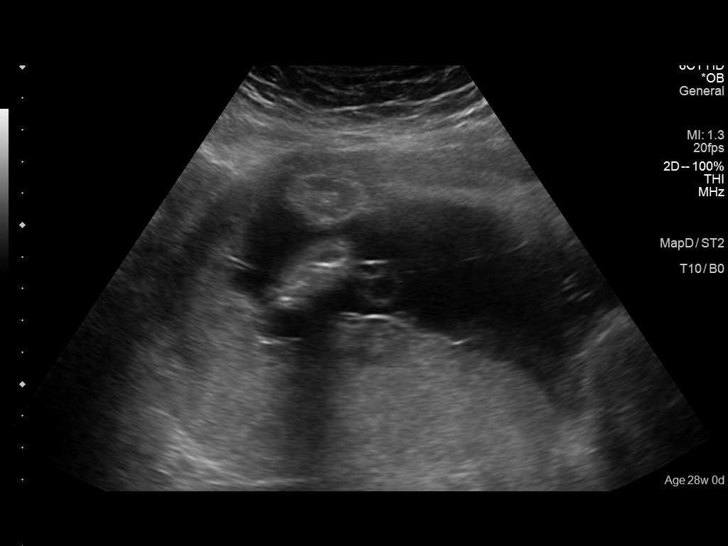
[im 43/53]
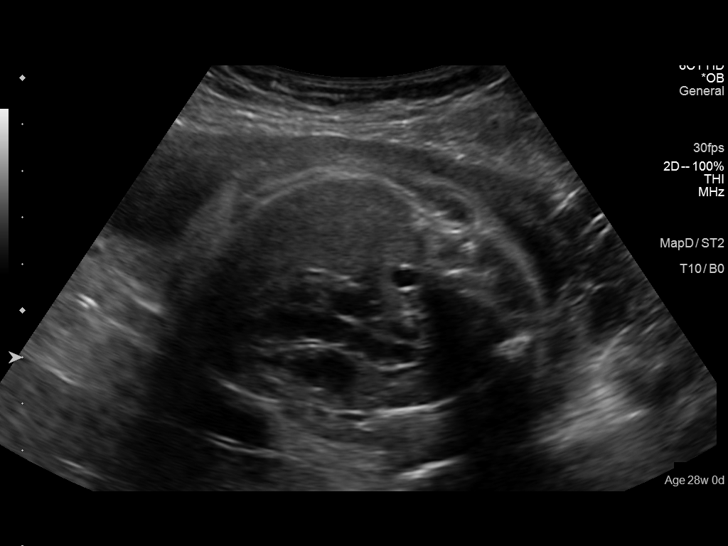
[im 47/53]
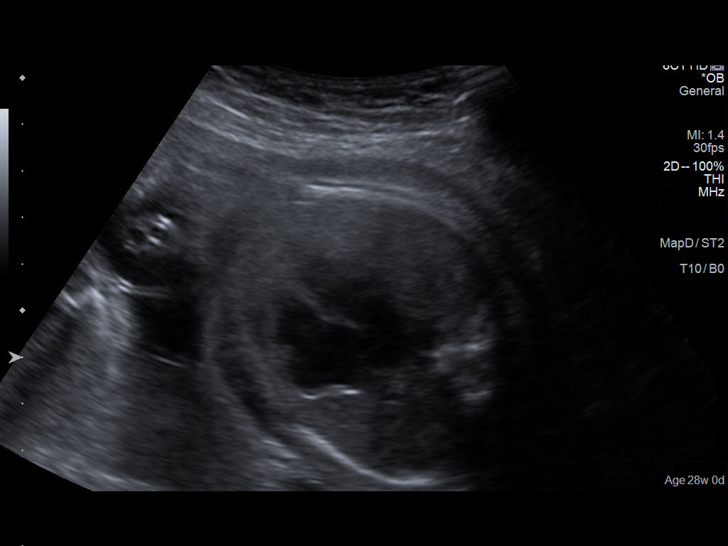
[im 51/53]
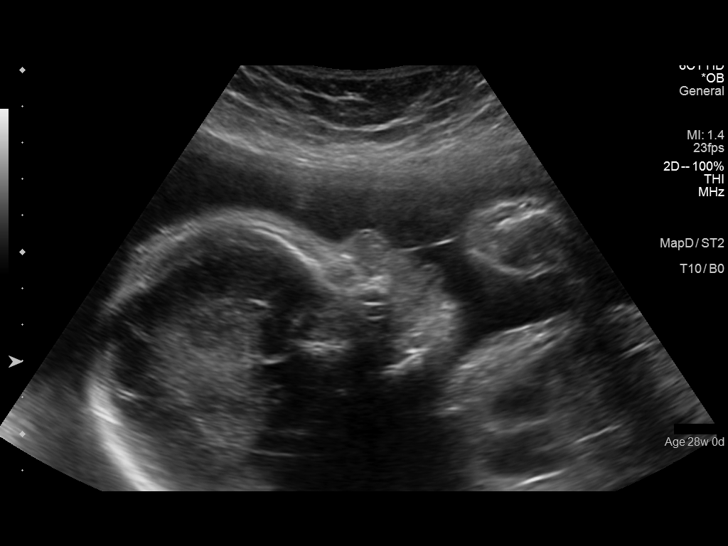

[12 of 28 positions shown; findings below may reference images not displayed]

OBSTETRICS REPORT
                      (Signed Final [DATE] [DATE])

Service(s) Provided

 US OB FOLLOW UP                                       76816.1
Indications

 27 weeks gestation of pregnancy
 Maternal morbid obesity                               [LB] [LB]
 Hypertension - Chronic/Pre-existing                   [LB]
 HIV: Asymptomatic                                     Z21
Fetal Evaluation

 Num Of Fetuses:    1
 Fetal Heart Rate:  157                          bpm
 Cardiac Activity:  Observed
 Presentation:      Breech
 Placenta:          Posterior, above cervical
                    os
 P. Cord            Visualized, central
 Insertion:

 Amniotic Fluid
 AFI FV:      Subjectively within normal limits
 AFI Sum:     11.83   cm       26  %Tile     Larg Pckt:    4.37  cm
 RUQ:   2.94    cm   RLQ:    2.37   cm    LUQ:   2.15    cm   LLQ:    4.37   cm
Biometry

 BPD:     66.1  mm     G. Age:  26w 4d                CI:         65.1   70 - 86
                                                      FL/HC:      19.4   18.8 -

 HC:     263.2  mm     G. Age:  28w 4d       39  %    HC/AC:      1.10   1.05 -

 AC:     238.5  mm     G. Age:  28w 1d       47  %    FL/BPD:     77.3   71 - 87
 FL:      51.1  mm     G. Age:  27w 2d       19  %    FL/AC:      21.4   20 - 24
 HUM:       49  mm     G. Age:  28w 6d       61  %
 Est. FW:    [LB]  gm      2 lb 8 oz     48  %
Gestational Age

 LMP:           28w 0d        Date:  [DATE]                 EDD:   [DATE]
 U/S Today:     27w 4d                                        EDD:   [DATE]
 Best:          28w 0d     Det. By:  LMP  ([DATE])          EDD:   [DATE]
Anatomy

 Cranium:          Appears normal         Aortic Arch:      Previously seen
 Fetal Cavum:      Previously seen        Ductal Arch:      Previously seen
 Ventricles:       Appears normal         Diaphragm:        Previously seen
 Choroid Plexus:   Previously seen        Stomach:          Appears normal, left
                                                            sided
 Cerebellum:       Previously seen        Abdomen:          Previously seen
 Posterior Fossa:  Previously seen        Abdominal Wall:   Not well visualized
 Nuchal Fold:      Previously seen        Cord Vessels:     Not well visualized
 Face:             Orbits and profile     Kidneys:          Appear normal
                   previously seen
 Lips:             Previously seen        Bladder:          Appears normal
 Palate:           Not well visualized    Spine:            Previously seen
 Heart:            Appears normal         Lower             Previously seen
                   (4CH, axis, and        Extremities:
                   situs)
 RVOT:             Previously seen        Upper             Previously seen
                                          Extremities:
 LVOT:             Appears normal

 Other:  Fetus appears to be a male. Heels and 5th digit previously visualized.
Cervix Uterus Adnexa

 Cervical Length:    3.8      cm

 Cervix:       Normal appearance by transabdominal scan.
 Uterus:       No abnormality visualized.
 Cul De Sac:   No free fluid seen.

 Left Ovary:    Not visualized.
 Right Ovary:   Not visualized.
 Adnexa:     No abnormality visualized.
Impression

 SIUP at [LB], CHTN, HIV
 EFW 48th%'le
 No dysmorphology on interval fetal anatomy
 Normal amniotic fluid volume
Recommendations

 1. monthly interval growth
 2. antenatal testing at 32 weeks

## 2014-01-03 NOTE — Progress Notes (Signed)
Will come back next week for 1 hr, unable to stay today.

## 2014-01-03 NOTE — Progress Notes (Signed)
Doing well.  Late for appt due to kids being unexpectedly out of school for Thanksgiving Holiday.  Ultrasound at 9:15.  Plans to return for 1 hr next week.

## 2014-01-09 ENCOUNTER — Encounter: Payer: Self-pay | Admitting: Infectious Disease

## 2014-01-09 LAB — CHOLESTEROL, TOTAL: Cholesterol, Total: 237

## 2014-01-09 LAB — LDL CHOLESTEROL, DIRECT: LDL CALC: 139 mg/dL

## 2014-01-09 LAB — HEMOGLOBIN: HGB: 9.7 g/dL

## 2014-01-09 LAB — CD4/CD8 (T-HELPER/T-SUPPRESSOR CELL)
CD4%: 31.4
CD4: 651
CD8 % Suppressor T Cell: 34
CD8: 706

## 2014-01-09 LAB — HIV-1 RNA QUANT-NO REFLEX-BLD: HIV-1 RNA Viral Load: <20

## 2014-01-10 ENCOUNTER — Other Ambulatory Visit: Payer: Self-pay | Admitting: Family Medicine

## 2014-01-10 ENCOUNTER — Other Ambulatory Visit (INDEPENDENT_AMBULATORY_CARE_PROVIDER_SITE_OTHER): Payer: Medicaid Other

## 2014-01-10 DIAGNOSIS — Z3493 Encounter for supervision of normal pregnancy, unspecified, third trimester: Secondary | ICD-10-CM

## 2014-01-10 DIAGNOSIS — O0992 Supervision of high risk pregnancy, unspecified, second trimester: Secondary | ICD-10-CM

## 2014-01-10 DIAGNOSIS — Z23 Encounter for immunization: Secondary | ICD-10-CM

## 2014-01-10 DIAGNOSIS — O98712 Human immunodeficiency virus [HIV] disease complicating pregnancy, second trimester: Secondary | ICD-10-CM

## 2014-01-10 MED ORDER — TETANUS-DIPHTH-ACELL PERTUSSIS 5-2.5-18.5 LF-MCG/0.5 IM SUSP
0.5000 mL | Freq: Once | INTRAMUSCULAR | Status: AC
  Administered 2014-01-10: 0.5 mL via INTRAMUSCULAR

## 2014-01-11 LAB — OBSTETRIC PANEL
Antibody Screen: NEGATIVE
BASOS PCT: 0 % (ref 0–1)
Basophils Absolute: 0 10*3/uL (ref 0.0–0.1)
EOS ABS: 0.1 10*3/uL (ref 0.0–0.7)
Eosinophils Relative: 1 % (ref 0–5)
HEMATOCRIT: 26.9 % — AB (ref 36.0–46.0)
HEP B S AG: NEGATIVE
Hemoglobin: 9.2 g/dL — ABNORMAL LOW (ref 12.0–15.0)
Lymphocytes Relative: 22 % (ref 12–46)
Lymphs Abs: 1.5 10*3/uL (ref 0.7–4.0)
MCH: 26.1 pg (ref 26.0–34.0)
MCHC: 34.2 g/dL (ref 30.0–36.0)
MCV: 76.2 fL — ABNORMAL LOW (ref 78.0–100.0)
MPV: 9.2 fL — ABNORMAL LOW (ref 9.4–12.4)
Monocytes Absolute: 0.6 10*3/uL (ref 0.1–1.0)
Monocytes Relative: 9 % (ref 3–12)
NEUTROS PCT: 68 % (ref 43–77)
Neutro Abs: 4.6 10*3/uL (ref 1.7–7.7)
PLATELETS: 263 10*3/uL (ref 150–400)
RBC: 3.53 MIL/uL — ABNORMAL LOW (ref 3.87–5.11)
RDW: 12.8 % (ref 11.5–15.5)
Rh Type: POSITIVE
Rubella: 3.05 Index — ABNORMAL HIGH (ref <0.90)
WBC: 6.8 10*3/uL (ref 4.0–10.5)

## 2014-01-11 LAB — GLUCOSE TOLERANCE, 1 HOUR (50G) W/O FASTING: GLUCOSE 1 HOUR GTT: 119 mg/dL (ref 70–140)

## 2014-01-15 ENCOUNTER — Encounter (HOSPITAL_COMMUNITY): Payer: Self-pay | Admitting: *Deleted

## 2014-01-15 ENCOUNTER — Inpatient Hospital Stay (HOSPITAL_COMMUNITY): Payer: Medicaid Other

## 2014-01-15 ENCOUNTER — Inpatient Hospital Stay (HOSPITAL_COMMUNITY)
Admission: AD | Admit: 2014-01-15 | Discharge: 2014-01-15 | Disposition: A | Discharge status: Home / Self Care | Payer: Medicaid Other | Source: Ambulatory Visit | Attending: Family Medicine | Admitting: Family Medicine

## 2014-01-15 ENCOUNTER — Telehealth: Payer: Self-pay | Admitting: *Deleted

## 2014-01-15 DIAGNOSIS — Z87891 Personal history of nicotine dependence: Secondary | ICD-10-CM | POA: Insufficient documentation

## 2014-01-15 DIAGNOSIS — O98713 Human immunodeficiency virus [HIV] disease complicating pregnancy, third trimester: Secondary | ICD-10-CM | POA: Insufficient documentation

## 2014-01-15 DIAGNOSIS — K5900 Constipation, unspecified: Secondary | ICD-10-CM | POA: Insufficient documentation

## 2014-01-15 DIAGNOSIS — D573 Sickle-cell trait: Secondary | ICD-10-CM | POA: Insufficient documentation

## 2014-01-15 DIAGNOSIS — O10013 Pre-existing essential hypertension complicating pregnancy, third trimester: Secondary | ICD-10-CM | POA: Diagnosis not present

## 2014-01-15 DIAGNOSIS — Z21 Asymptomatic human immunodeficiency virus [HIV] infection status: Secondary | ICD-10-CM | POA: Insufficient documentation

## 2014-01-15 DIAGNOSIS — K5909 Other constipation: Secondary | ICD-10-CM

## 2014-01-15 DIAGNOSIS — Z3A29 29 weeks gestation of pregnancy: Secondary | ICD-10-CM | POA: Insufficient documentation

## 2014-01-15 DIAGNOSIS — O9989 Other specified diseases and conditions complicating pregnancy, childbirth and the puerperium: Secondary | ICD-10-CM

## 2014-01-15 DIAGNOSIS — O99013 Anemia complicating pregnancy, third trimester: Secondary | ICD-10-CM | POA: Diagnosis not present

## 2014-01-15 DIAGNOSIS — R109 Unspecified abdominal pain: Secondary | ICD-10-CM | POA: Insufficient documentation

## 2014-01-15 DIAGNOSIS — O26899 Other specified pregnancy related conditions, unspecified trimester: Secondary | ICD-10-CM

## 2014-01-15 HISTORY — DX: Sickle-cell trait: D57.3

## 2014-01-15 LAB — URINALYSIS, ROUTINE W REFLEX MICROSCOPIC
Bilirubin Urine: NEGATIVE
Glucose, UA: NEGATIVE mg/dL
HGB URINE DIPSTICK: NEGATIVE
Ketones, ur: 40 mg/dL — AB
Leukocytes, UA: NEGATIVE
Nitrite: NEGATIVE
Protein, ur: NEGATIVE mg/dL
SPECIFIC GRAVITY, URINE: 1.02 (ref 1.005–1.030)
Urobilinogen, UA: 0.2 mg/dL (ref 0.0–1.0)
pH: 5.5 (ref 5.0–8.0)

## 2014-01-15 LAB — FETAL FIBRONECTIN: FETAL FIBRONECTIN: NEGATIVE

## 2014-01-15 IMAGING — US US RENAL
1 series · 14 of 25 positions shown · non-contrast
Comparison: [DATE]

CLINICAL DATA: Intermittent back pain, 30 weeks pregnant

EXAM:
RENAL/URINARY TRACT ULTRASOUND COMPLETE

[Series 1: us renal · 38 acquisitions, 14 frames shown]
[im 1/38]
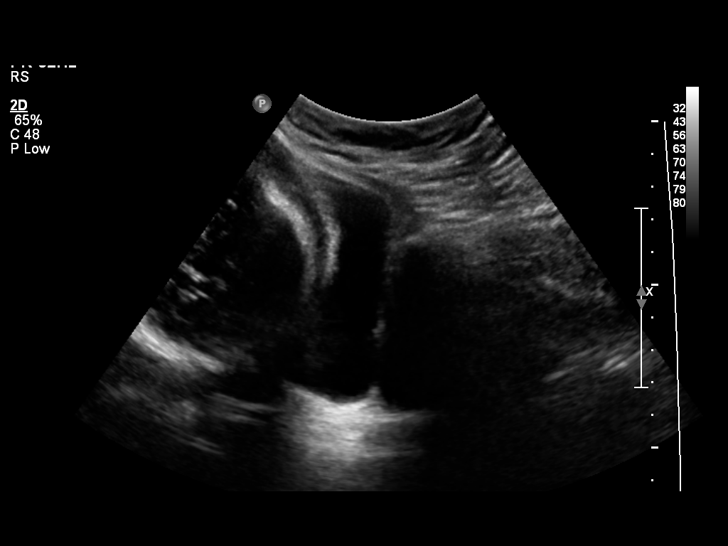
[im 4/38]
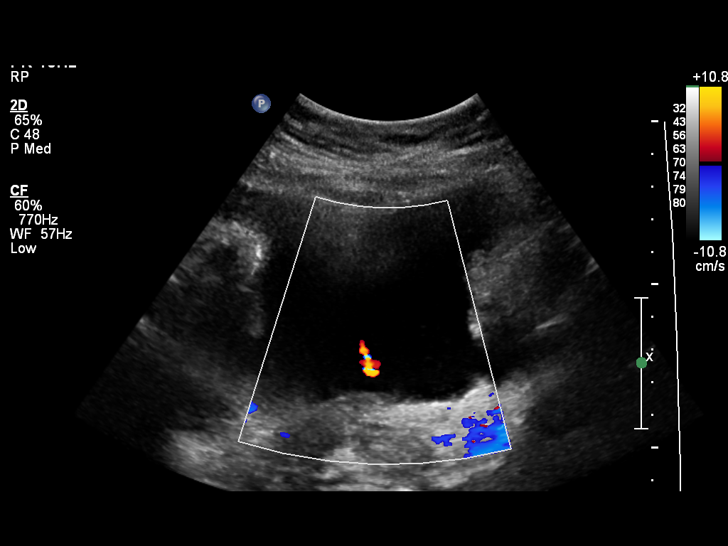
[im 7/38]
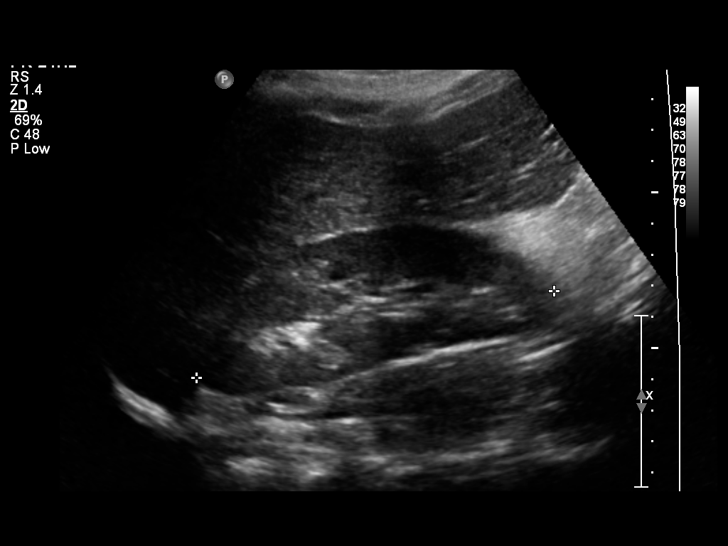
[im 10/38]
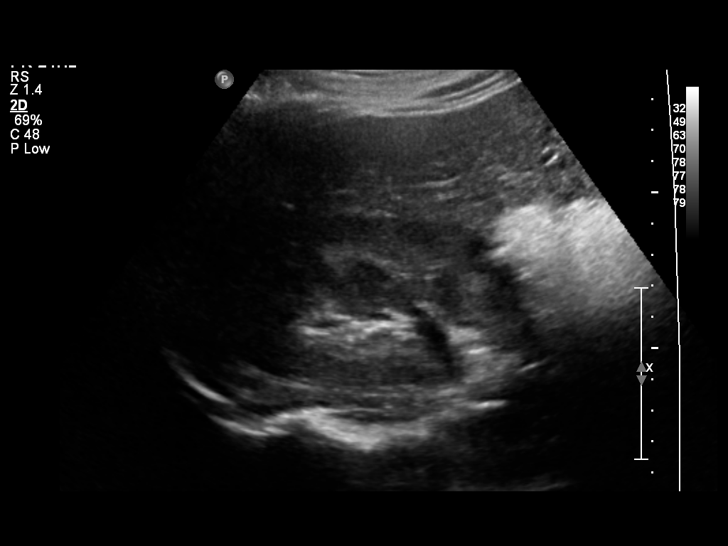
[im 13/38]
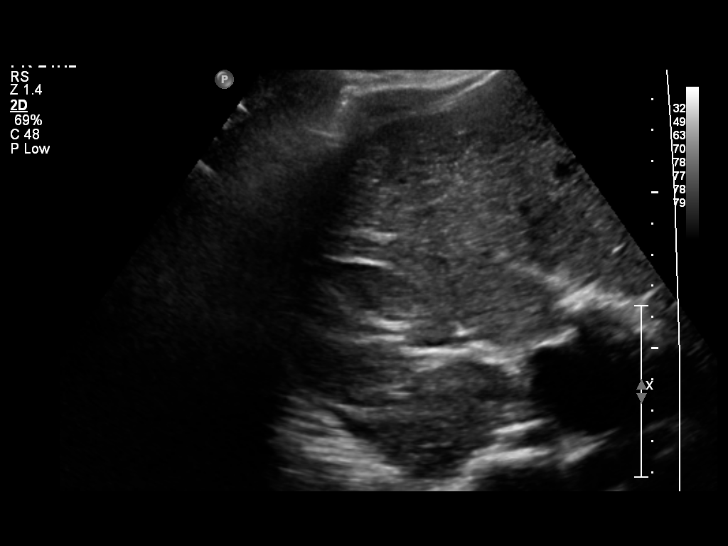
[im 14/38]
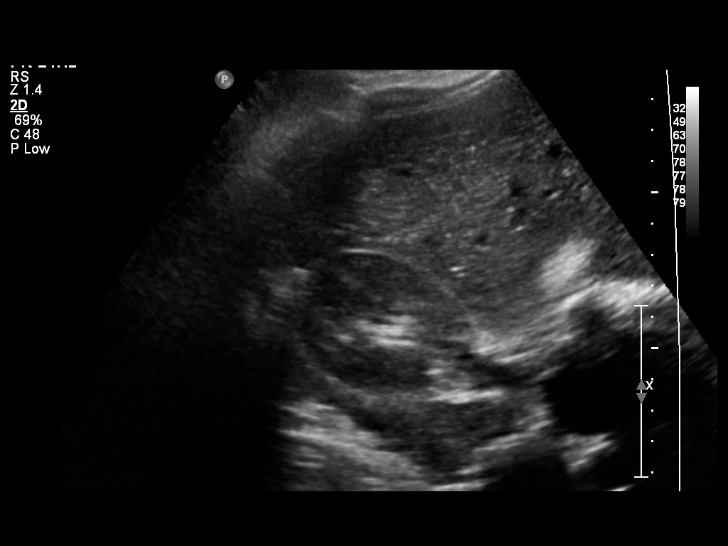
[im 17/38]
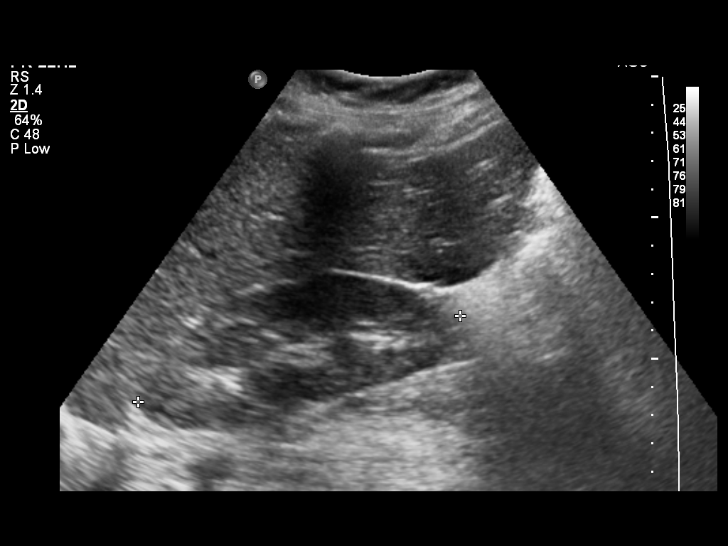
[im 21/38]
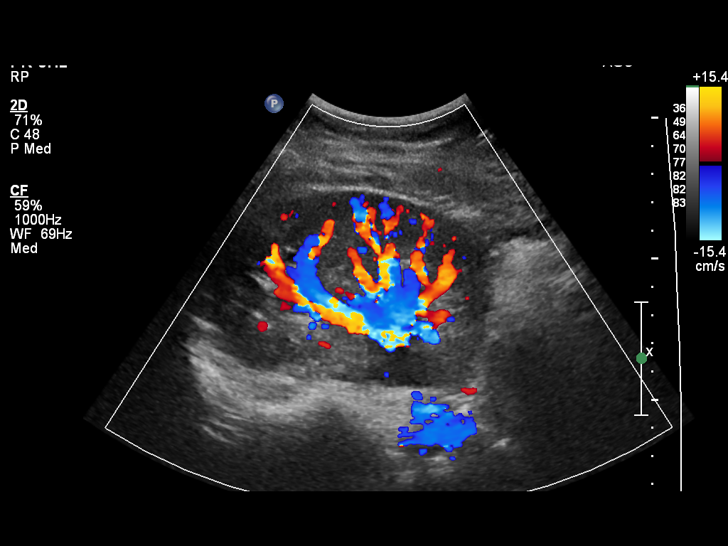
[im 24/38]
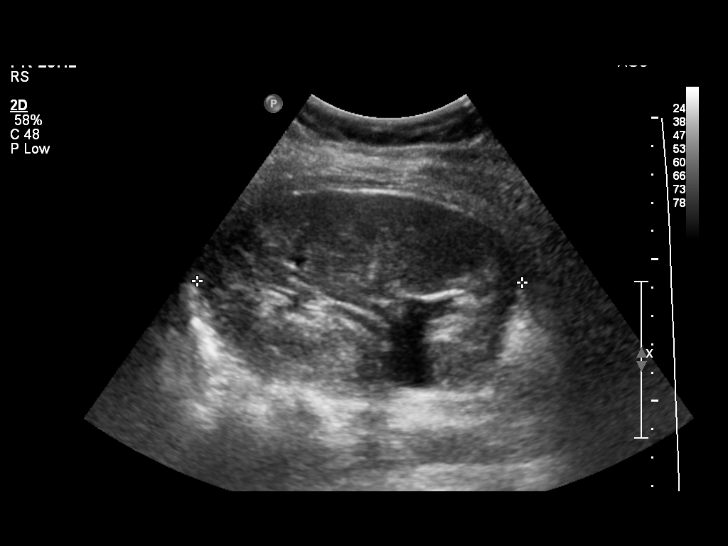
[im 25/38]
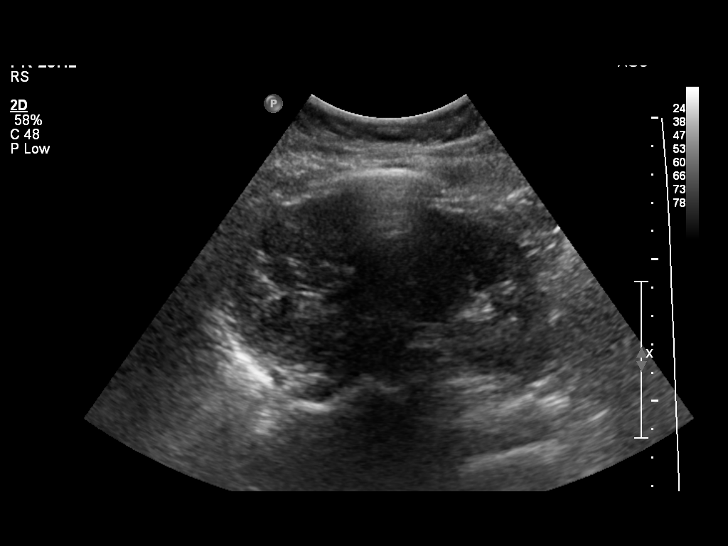
[im 28/38]
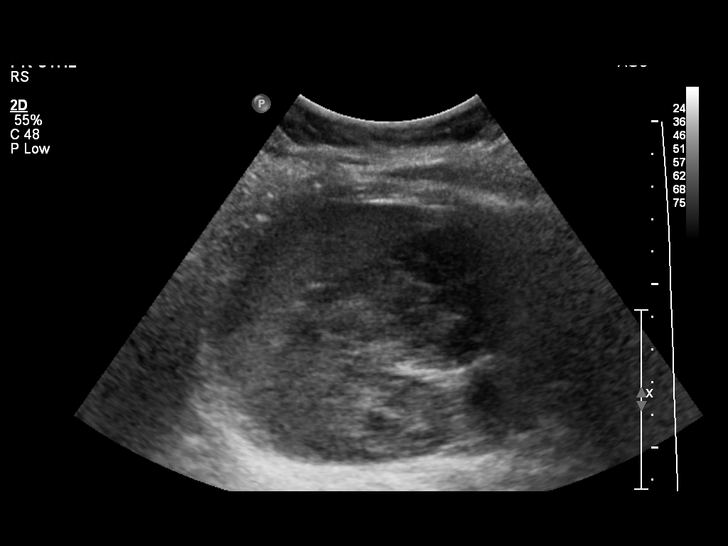
[im 31/38]
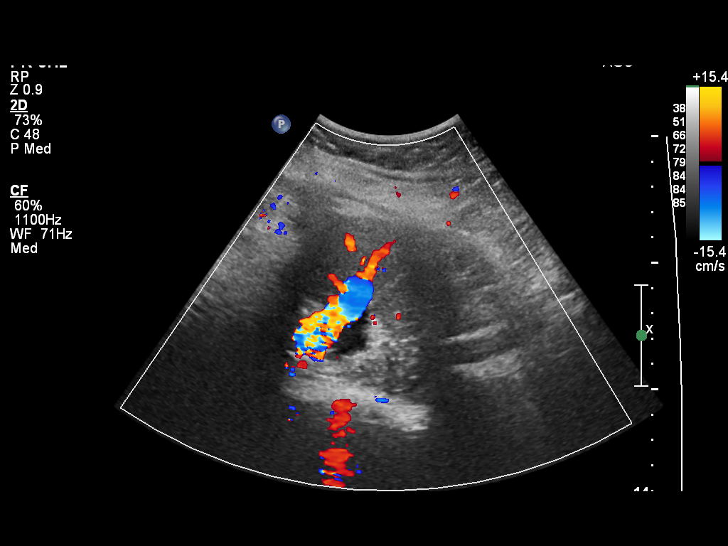
[im 34/38]
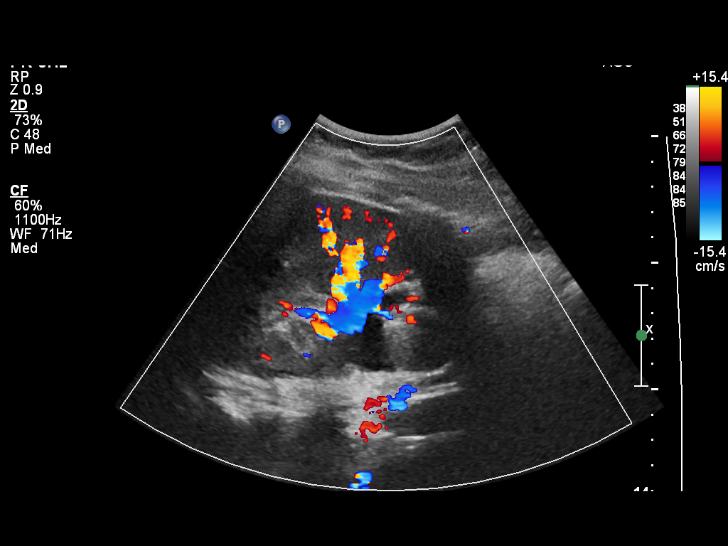
[im 38/38]
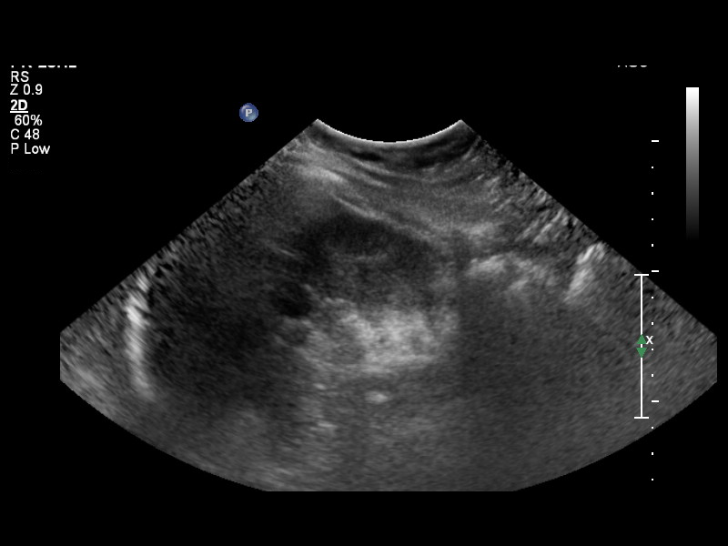

[14 of 25 positions shown; findings below may reference images not displayed]

FINDINGS: Right Kidney:

Length: 11.8 cm.. Echogenicity within normal limits. No mass or
hydronephrosis visualized.

Left Kidney:

Length: 11.5 cm.. Mild central fullness of the collecting system is
noted. This does not appear to extend into the proximal ureter.

Bladder:

Appears normal for degree of bladder distention. Bilateral ureteral
jets are noted.
IMPRESSION: Mild left hydronephrosis without definitive hydroureter.

## 2014-01-15 MED ORDER — POLYETHYLENE GLYCOL 3350 17 GM/SCOOP PO POWD
17.0000 g | Freq: Every day | ORAL | Status: DC
Start: 2014-01-15 — End: 2014-03-16

## 2014-01-15 MED ORDER — LACTATED RINGERS IV BOLUS (SEPSIS)
1000.0000 mL | Freq: Once | INTRAVENOUS | Status: AC
  Administered 2014-01-15: 1000 mL via INTRAVENOUS

## 2014-01-15 NOTE — Telephone Encounter (Signed)
Patient called and stated that she is experiencing severe pain and dizziness. This has been going on since 100, she was tearful on the phone. I advised that she come to [facility] for evaluation of her pain and dizziness, advised patient to not drive herself, patient agreed and states that she has someone to bring her.

## 2014-01-15 NOTE — [facility] Note (Signed)
Intermittent severe pelvic pain, also back pain since 1300.  Denies bleeding or LOF.  Nausea since 1330, no vomiting, took zofran.

## 2014-01-15 NOTE — Discharge Instructions (Signed)
[facility]. We ran several tests which were negative for early labor or other concerning causes of abdominal pain. Your symptoms are likely due to round ligament pain and constipation. Please follow up with your primary OB as scheduled on Wednesday. You may use tylenol as needed for pain. You should take Miralax daily to help with constipation.    Abdominal Pain During Pregnancy Belly (abdominal) pain is common during pregnancy. Most of the time, it is not a serious problem. Other times, it can be a sign that something is wrong with the pregnancy. Always tell your doctor if you have belly pain. HOME CARE Monitor your belly pain for any changes. The following actions may help you feel better:  Do not have sex (intercourse) or put anything in your vagina until you feel better.  Rest until your pain stops.  Drink clear fluids if you feel sick to your stomach (nauseous). Do not eat solid food until you feel better.  Only take medicine as told by your doctor.  Keep all doctor visits as told. GET HELP RIGHT AWAY IF:   You are bleeding, leaking fluid, or pieces of tissue come out of your vagina.  You have more pain or cramping.  You keep throwing up (vomiting).  You have pain when you pee (urinate) or have blood in your pee.  You have a fever.  You do not feel your baby moving as much.  You feel very weak or feel like passing out.  You have trouble breathing, with or without belly pain.  You have a very bad headache and belly pain.  You have fluid leaking from your vagina and belly pain.  You keep having watery poop (diarrhea).  Your belly pain does not go away after resting, or the pain gets worse. MAKE SURE YOU:   Understand these instructions.  Will watch your condition.  Will get help right away if you are not doing well or get worse. Document Released: 01/14/2009 Document Revised: 09/28/2012 Document Reviewed:  08/25/2012 [facility] Patient Information 2015 [employer], [place]. This information is not intended to replace advice given to you by your health care provider. Make sure you discuss any questions you have with your health care provider.

## 2014-01-15 NOTE — [facility] Provider Note (Cosign Needed)
History     CSN: [id number]  Arrival date and time: 01/15/14 1549   First Provider Initiated Contact with Patient 01/15/14 1647      Chief Complaint  Patient presents with  . Abdominal Pain  . Nausea   HPI  Catherine Simmons is a 28 y.o. G3P2002 at 2871w5d who presents to [facility] for evaluation of abdominal pain. Her pregnancy is complicated by HIV disease (VL <20), obesity, and chronic hypertension. Was in her usual state of health until this afternoon when she developed abdominal pain (left flank and suprapubic pressure). Pain is sharp and intermittent. Associated with nausea, no vomiting. Denies dysuria, but states she feels the abdominal pain more during urination. Denies vaginal bleeding, discharge. No new sexual partners. Last bowel movement on Saturday. Passing gas. Took zofran this afternoon with some improvement in nausea.   +Fetal movement. Denies uterine contractions, loss of fluid, vaginal bleeding.    OB History    Gravida Para Term Preterm AB TAB SAB Ectopic Multiple Living   3 2 2       2       Past Medical History  Diagnosis Date  . Cholecystitis 07/16/10  . Hypertension   . HIV (human immunodeficiency virus infection)   . Genital warts 2004  . Sickle cell trait     Past Surgical History  Procedure Laterality Date  . Cholecystectomy    . Wisdom tooth extraction      Family History  Problem Relation Age of Onset  . Cancer Mother   . Diabetes Maternal Aunt     History  Substance Use Topics  . Smoking status: Former Smoker    Start date: 03/11/2012    Quit date: 08/24/2012  . Smokeless tobacco: Never Used  . Alcohol Use: No    Allergies:  Allergies  Allergen Reactions  . Stadol [Butorphanol Tartrate] Itching    Prescriptions prior to admission  Medication Sig Dispense Refill Last Dose  . acetaminophen (TYLENOL) 500 MG tablet Take 1,000 mg by mouth every 6 (six) hours as needed for mild pain. Stomach pains   Past Week at Unknown  time  . darunavir-cobicistat (PREZCOBIX) 800-150 MG per tablet Take 1 tablet by mouth daily. Swallow whole. Do NOT crush, break or chew tablets. Take with food. 30 tablet 11 01/15/2014 at Unknown time  . emtricitabine-tenofovir (TRUVADA) 200-300 MG per tablet Take 1 tablet by mouth daily. 30 tablet 11 01/15/2014 at Unknown time  . ondansetron (ZOFRAN ODT) 8 MG disintegrating tablet Take 1 tablet (8 mg total) by mouth every 8 (eight) hours as needed for nausea or vomiting. 30 tablet 4 01/15/2014 at Unknown time  . Prenatal Vit-Fe Fumarate-FA (PRENATAL MULTIVITAMIN) TABS tablet Take 1 tablet by mouth daily at 12 noon.   Past Week at Unknown time  . amLODipine (NORVASC) 10 MG tablet TAKE 1 TABLET BY MOUTH DAILY (Patient not taking: Reported on 01/03/2014) 30 tablet 5 Not Taking at Unknown time  . Fe Fum-FePoly-FA-Vit C-Vit B3 (INTEGRA F) 125-1 MG CAPS Take 1 capsule by mouth daily. (Patient not taking: Reported on 01/15/2014) 30 capsule 1 Not Taking at Unknown time  . metroNIDAZOLE (METROGEL VAGINAL) 0.75 % vaginal gel Place 1 Applicatorful vaginally 2 (two) times daily. (Patient not taking: Reported on 01/03/2014) 70 g 0 Completed Course at Unknown time    Review of Systems  Constitutional: Negative.  Negative for fever, chills and malaise/fatigue.  HENT: Negative.  Negative for congestion and sore throat.   Eyes: Negative.  Negative  for double vision and photophobia.  Respiratory: Negative.  Negative for cough, shortness of breath and wheezing.   Cardiovascular: Negative.  Negative for chest pain and leg swelling.  Gastrointestinal: Positive for nausea, abdominal pain and constipation. Negative for vomiting, diarrhea, blood in stool and melena.  Genitourinary: Positive for flank pain. Negative for dysuria, urgency, frequency and hematuria.  Musculoskeletal: Negative.  Negative for myalgias.  Skin: Negative.   Neurological: Negative.  Negative for weakness and headaches.  Psychiatric/Behavioral:  Negative.   All other systems reviewed and are negative.  Physical Exam   Blood pressure 134/84, pulse 99, temperature 97.5 F (36.4 C), temperature source Oral, resp. rate 20, last menstrual period 06/21/2013.  Physical Exam  Nursing note and vitals reviewed. Constitutional: She is oriented to person, place, and time. She appears well-developed and well-nourished. No distress.  HENT:  Head: Normocephalic and atraumatic.  Cardiovascular: Normal rate.   Respiratory: Effort normal.  GI: Soft. There is tenderness (suprapubic and left flank). There is no rebound and no guarding.  Neurological: She is alert and oriented to person, place, and time.  Skin: Skin is warm and dry.  Psychiatric: She has a normal mood and affect. Her behavior is normal.    MAU Course  Procedures  MDM FFN negative GC/CT collected Urinalysis +ketones, otherwise wnl NST reactive Renal ultrasound  IMPRESSION: Mild left hydronephrosis without definitive hydroureter.  Assessment and Plan  Catherine Simmons is a 28 y.o. G3P2002 at 7739w5d here for evaluation of abdominal pain.   #Abdominal Pain Etiology remains unclear although suspect ligamentous pain in pregnancy. Constipation also likely contributing. Afebrile with stable vital signs. Abdominal examination benign. No evidence of preterm labor, no contractions on toco, cervix FT/Th/Hi, and FFN negative. Urinalysis +ketones, but otherwise negative. Renal ultrasound negative (mild hydronephrosis although likely physiologic).  - 1L LR bolus here. Recommend continued oral hydration at home.  - Recommend abdominal binder.  - Tylenol 1000 mg every 8 hours as needed for pain. - Strict return precautions given.   #Constipation - Rx Miralax 17 grams daily  #SIUP at 2439w5d - Not in preterm labor, safe for discharge to home.  - Follow up with primary prenatal provider on Wednesday as scheduled.  - FM/PTL precautions reviewed.   William DaltonMcEachern, Deiondra Denley 01/15/2014, 5:41  PM

## 2014-01-16 LAB — GC/CHLAMYDIA PROBE AMP
CT PROBE, AMP APTIMA: NEGATIVE
GC PROBE AMP APTIMA: NEGATIVE

## 2014-01-17 ENCOUNTER — Ambulatory Visit (INDEPENDENT_AMBULATORY_CARE_PROVIDER_SITE_OTHER): Payer: Medicaid Other | Admitting: Advanced Practice Midwife

## 2014-01-17 VITALS — BP 114/67 | HR 107 | Temp 98.1°F | Wt 234.4 lb

## 2014-01-17 DIAGNOSIS — O0993 Supervision of high risk pregnancy, unspecified, third trimester: Secondary | ICD-10-CM

## 2014-01-17 LAB — POCT URINALYSIS DIP (DEVICE)
BILIRUBIN URINE: NEGATIVE
Glucose, UA: NEGATIVE mg/dL
Hgb urine dipstick: NEGATIVE
KETONES UR: NEGATIVE mg/dL
Leukocytes, UA: NEGATIVE
NITRITE: NEGATIVE
PH: 5.5 (ref 5.0–8.0)
PROTEIN: NEGATIVE mg/dL
Specific Gravity, Urine: 1.02 (ref 1.005–1.030)
Urobilinogen, UA: 0.2 mg/dL (ref 0.0–1.0)

## 2014-01-17 NOTE — Progress Notes (Signed)
Doing well. Agree with Resident's note.

## 2014-01-17 NOTE — Progress Notes (Signed)
28yo female at 7830weeks. Complains of nausea. She has tried Phenergan, Promethazine, and is currently taking Zofran. States Zofran is the only thing that has ever helped at all, however it has only prevented vomiting and she continues to have nausea. Discussed separating fluids and solids when she is eating to aid with nausea. Has noted constipation with Zofran, however she has Miralax to use for this. Discussed increased intake of fiber. States she occasionally has headaches at night, but denies changes in vision or swelling. Denies contractions. Denies vaginal bleeding or leakage of fluids. Continues to feel fetal movements. No other concerns today. Follow up in 2weeks at high risk clinic due to history of HIV and chronic HTN.

## 2014-01-17 NOTE — Progress Notes (Signed)
C/o of nausea 

## 2014-01-17 NOTE — Assessment & Plan Note (Signed)
Recommend following up at [facility] Clinic at next visit in 2 weeks.

## 2014-01-17 NOTE — Patient Instructions (Addendum)
Third Trimester of Pregnancy The third trimester is from week 29 through week 42, months 7 through 9. The third trimester is a time when the fetus is growing rapidly. At the end of the ninth month, the fetus is about 20 inches in length and weighs 6-10 pounds.  BODY CHANGES Your body goes through many changes during pregnancy. The changes vary from woman to woman.   Your weight will continue to increase. You can expect to gain 25-35 pounds (11-16 kg) by the end of the pregnancy.  You may begin to get stretch marks on your hips, abdomen, and breasts.  You may urinate more often because the fetus is moving lower into your pelvis and pressing on your bladder.  You may develop or continue to have heartburn as a result of your pregnancy.  You may develop constipation because certain hormones are causing the muscles that push waste through your intestines to slow down.  You may develop hemorrhoids or swollen, bulging veins (varicose veins).  You may have pelvic pain because of the weight gain and pregnancy hormones relaxing your joints between the bones in your pelvis. Backaches may result from overexertion of the muscles supporting your posture.  You may have changes in your hair. These can include thickening of your hair, rapid growth, and changes in texture. Some women also have hair loss during or after pregnancy, or hair that feels dry or thin. Your hair will most likely return to normal after your baby is born.  Your breasts will continue to grow and be tender. A yellow discharge may leak from your breasts called colostrum.  Your belly button may stick out.  You may feel short of breath because of your expanding uterus.  You may notice the fetus "dropping," or moving lower in your abdomen.  You may have a bloody mucus discharge. This usually occurs a few days to a week before labor begins.  Your cervix becomes thin and soft (effaced) near your due date. WHAT TO EXPECT AT YOUR PRENATAL  EXAMS  You will have prenatal exams every 2 weeks until week 36. Then, you will have weekly prenatal exams. During a routine prenatal visit:  You will be weighed to make sure you and the fetus are growing normally.  Your blood pressure is taken.  Your abdomen will be measured to track your baby's growth.  The fetal heartbeat will be listened to.  Any test results from the previous visit will be discussed.  You may have a cervical check near your due date to see if you have effaced. At around 36 weeks, your caregiver will check your cervix. At the same time, your caregiver will also perform a test on the secretions of the vaginal tissue. This test is to determine if a type of bacteria, Group B streptococcus, is present. Your caregiver will explain this further. Your caregiver may ask you:  What your birth plan is.  How you are feeling.  If you are feeling the baby move.  If you have had any abnormal symptoms, such as leaking fluid, bleeding, severe headaches, or abdominal cramping.  If you have any questions. Other tests or screenings that may be performed during your third trimester include:  Blood tests that check for low iron levels (anemia).  Fetal testing to check the health, activity level, and growth of the fetus. Testing is done if you have certain medical conditions or if there are problems during the pregnancy. FALSE LABOR You may feel small, irregular contractions that   eventually go away. These are called Braxton Hicks contractions, or false labor. Contractions may last for hours, days, or even weeks before true labor sets in. If contractions come at regular intervals, intensify, or become painful, it is best to be seen by your caregiver.  SIGNS OF LABOR   Menstrual-like cramps.  Contractions that are 5 minutes apart or less.  Contractions that start on the top of the uterus and spread down to the lower abdomen and back.  A sense of increased pelvic pressure or back  pain.  A watery or bloody mucus discharge that comes from the vagina. If you have any of these signs before the 37th week of pregnancy, call your caregiver right away. You need to go to the hospital to get checked immediately. HOME CARE INSTRUCTIONS   Avoid all smoking, herbs, alcohol, and unprescribed drugs. These chemicals affect the formation and growth of the baby.  Follow your caregiver's instructions regarding medicine use. There are medicines that are either safe or unsafe to take during pregnancy.  Exercise only as directed by your caregiver. Experiencing uterine cramps is a good sign to stop exercising.  Continue to eat regular, healthy meals.  Wear a good support bra for breast tenderness.  Do not use hot tubs, steam rooms, or saunas.  Wear your seat belt at all times when driving.  Avoid raw meat, uncooked cheese, cat litter boxes, and soil used by cats. These carry germs that can cause birth defects in the baby.  Take your prenatal vitamins.  Try taking a stool softener (if your caregiver approves) if you develop constipation. Eat more high-fiber foods, such as fresh vegetables or fruit and whole grains. Drink plenty of fluids to keep your urine clear or pale yellow.  Take warm sitz baths to soothe any pain or discomfort caused by hemorrhoids. Use hemorrhoid cream if your caregiver approves.  If you develop varicose veins, wear support hose. Elevate your feet for 15 minutes, 3-4 times a day. Limit salt in your diet.  Avoid heavy lifting, wear low heal shoes, and practice good posture.  Rest a lot with your legs elevated if you have leg cramps or low back pain.  Visit your dentist if you have not gone during your pregnancy. Use a soft toothbrush to brush your teeth and be gentle when you floss.  A sexual relationship may be continued unless your caregiver directs you otherwise.  Do not travel far distances unless it is absolutely necessary and only with the approval  of your caregiver.  Take prenatal classes to understand, practice, and ask questions about the labor and delivery.  Make a trial run to the hospital.  Pack your hospital bag.  Prepare the baby's nursery.  Continue to go to all your prenatal visits as directed by your caregiver. SEEK MEDICAL CARE IF:  You are unsure if you are in labor or if your water has broken.  You have dizziness.  You have mild pelvic cramps, pelvic pressure, or nagging pain in your abdominal area.  You have persistent nausea, vomiting, or diarrhea.  You have a bad smelling vaginal discharge.  You have pain with urination. SEEK IMMEDIATE MEDICAL CARE IF:   You have a fever.  You are leaking fluid from your vagina.  You have spotting or bleeding from your vagina.  You have severe abdominal cramping or pain.  You have rapid weight loss or gain.  You have shortness of breath with chest pain.  You notice sudden or extreme swelling   of your face, hands, ankles, feet, or legs.  You have not felt your baby move in over an hour.  You have severe headaches that do not go away with medicine.  You have vision changes. Document Released: 01/20/2001 Document Revised: 01/31/2013 Document Reviewed: 03/29/2012 [employer] Patient Information 2015 [employer]. This information is not intended to replace advice given to you by your health care provider. Make sure you discuss any questions you have with your health care provider.  

## 2014-01-23 ENCOUNTER — Other Ambulatory Visit: Payer: Self-pay | Admitting: Internal Medicine

## 2014-01-24 ENCOUNTER — Other Ambulatory Visit: Payer: Self-pay

## 2014-01-24 ENCOUNTER — Telehealth: Payer: Self-pay

## 2014-01-24 MED ORDER — ONDANSETRON 8 MG PO TBDP: ORAL_TABLET | ORAL | Status: DC

## 2014-01-24 NOTE — Telephone Encounter (Signed)
Patient is calling for Zofran due to increased nausea.  Laurell Josephsammy K Parry Po, RN

## 2014-01-25 ENCOUNTER — Other Ambulatory Visit: Payer: Self-pay | Admitting: Obstetrics & Gynecology

## 2014-01-25 ENCOUNTER — Ambulatory Visit (INDEPENDENT_AMBULATORY_CARE_PROVIDER_SITE_OTHER): Payer: Medicaid Other | Admitting: Obstetrics & Gynecology

## 2014-01-25 VITALS — BP 133/84 | HR 95 | Temp 98.3°F | Wt 241.1 lb

## 2014-01-25 DIAGNOSIS — O0993 Supervision of high risk pregnancy, unspecified, third trimester: Secondary | ICD-10-CM

## 2014-01-25 LAB — POCT URINALYSIS DIP (DEVICE)
Bilirubin Urine: NEGATIVE
Glucose, UA: NEGATIVE mg/dL
Hgb urine dipstick: NEGATIVE
Ketones, ur: NEGATIVE mg/dL
Leukocytes, UA: NEGATIVE
Nitrite: NEGATIVE
Protein, ur: NEGATIVE mg/dL
SPECIFIC GRAVITY, URINE: 1.015 (ref 1.005–1.030)
Urobilinogen, UA: 1 mg/dL (ref 0.0–1.0)
pH: 7 (ref 5.0–8.0)

## 2014-01-25 MED ORDER — BUTALBITAL-APAP-CAFFEINE 50-500-40 MG PO TABS: 1.0000 | ORAL_TABLET | ORAL | Status: DC | PRN

## 2014-01-25 MED ORDER — PANTOPRAZOLE SODIUM 40 MG PO TBEC
40.0000 mg | DELAYED_RELEASE_TABLET | Freq: Every day | ORAL | Status: DC
Start: 2014-01-25 — End: 2014-02-28

## 2014-01-25 NOTE — Progress Notes (Signed)
Wants to sign BTL papers. Headaches, nausea, reflux, vulvar itch. Wet prep sent. Protonix 40 mg daily. Fioricet for headache.

## 2014-01-25 NOTE — Patient Instructions (Signed)
Third Trimester of Pregnancy The third trimester is from week 29 through week 42, months 7 through 9. The third trimester is a time when the fetus is growing rapidly. At the end of the ninth month, the fetus is about 20 inches in length and weighs 6-10 pounds.  BODY CHANGES Your body goes through many changes during pregnancy. The changes vary from woman to woman.   Your weight will continue to increase. You can expect to gain 25-35 pounds (11-16 kg) by the end of the pregnancy.  You may begin to get stretch marks on your hips, abdomen, and breasts.  You may urinate more often because the fetus is moving lower into your pelvis and pressing on your bladder.  You may develop or continue to have heartburn as a result of your pregnancy.  You may develop constipation because certain hormones are causing the muscles that push waste through your intestines to slow down.  You may develop hemorrhoids or swollen, bulging veins (varicose veins).  You may have pelvic pain because of the weight gain and pregnancy hormones relaxing your joints between the bones in your pelvis. Backaches may result from overexertion of the muscles supporting your posture.  You may have changes in your hair. These can include thickening of your hair, rapid growth, and changes in texture. Some women also have hair loss during or after pregnancy, or hair that feels dry or thin. Your hair will most likely return to normal after your baby is born.  Your breasts will continue to grow and be tender. A yellow discharge may leak from your breasts called colostrum.  Your belly button may stick out.  You may feel short of breath because of your expanding uterus.  You may notice the fetus "dropping," or moving lower in your abdomen.  You may have a bloody mucus discharge. This usually occurs a few days to a week before labor begins.  Your cervix becomes thin and soft (effaced) near your due date. WHAT TO EXPECT AT YOUR PRENATAL  EXAMS  You will have prenatal exams every 2 weeks until week 36. Then, you will have weekly prenatal exams. During a routine prenatal visit:  You will be weighed to make sure you and the fetus are growing normally.  Your blood pressure is taken.  Your abdomen will be measured to track your baby's growth.  The fetal heartbeat will be listened to.  Any test results from the previous visit will be discussed.  You may have a cervical check near your due date to see if you have effaced. At around 36 weeks, your caregiver will check your cervix. At the same time, your caregiver will also perform a test on the secretions of the vaginal tissue. This test is to determine if a type of bacteria, Group B streptococcus, is present. Your caregiver will explain this further. Your caregiver may ask you:  What your birth plan is.  How you are feeling.  If you are feeling the baby move.  If you have had any abnormal symptoms, such as leaking fluid, bleeding, severe headaches, or abdominal cramping.  If you have any questions. Other tests or screenings that may be performed during your third trimester include:  Blood tests that check for low iron levels (anemia).  Fetal testing to check the health, activity level, and growth of the fetus. Testing is done if you have certain medical conditions or if there are problems during the pregnancy. FALSE LABOR You may feel small, irregular contractions that   eventually go away. These are called Braxton Hicks contractions, or false labor. Contractions may last for hours, days, or even weeks before true labor sets in. If contractions come at regular intervals, intensify, or become painful, it is best to be seen by your caregiver.  SIGNS OF LABOR   Menstrual-like cramps.  Contractions that are 5 minutes apart or less.  Contractions that start on the top of the uterus and spread down to the lower abdomen and back.  A sense of increased pelvic pressure or back  pain.  A watery or bloody mucus discharge that comes from the vagina. If you have any of these signs before the 37th week of pregnancy, call your caregiver right away. You need to go to the hospital to get checked immediately. HOME CARE INSTRUCTIONS   Avoid all smoking, herbs, alcohol, and unprescribed drugs. These chemicals affect the formation and growth of the baby.  Follow your caregiver's instructions regarding medicine use. There are medicines that are either safe or unsafe to take during pregnancy.  Exercise only as directed by your caregiver. Experiencing uterine cramps is a good sign to stop exercising.  Continue to eat regular, healthy meals.  Wear a good support bra for breast tenderness.  Do not use hot tubs, steam rooms, or saunas.  Wear your seat belt at all times when driving.  Avoid raw meat, uncooked cheese, cat litter boxes, and soil used by cats. These carry germs that can cause birth defects in the baby.  Take your prenatal vitamins.  Try taking a stool softener (if your caregiver approves) if you develop constipation. Eat more high-fiber foods, such as fresh vegetables or fruit and whole grains. Drink plenty of fluids to keep your urine clear or pale yellow.  Take warm sitz baths to soothe any pain or discomfort caused by hemorrhoids. Use hemorrhoid cream if your caregiver approves.  If you develop varicose veins, wear support hose. Elevate your feet for 15 minutes, 3-4 times a day. Limit salt in your diet.  Avoid heavy lifting, wear low heal shoes, and practice good posture.  Rest a lot with your legs elevated if you have leg cramps or low back pain.  Visit your dentist if you have not gone during your pregnancy. Use a soft toothbrush to brush your teeth and be gentle when you floss.  A sexual relationship may be continued unless your caregiver directs you otherwise.  Do not travel far distances unless it is absolutely necessary and only with the approval  of your caregiver.  Take prenatal classes to understand, practice, and ask questions about the labor and delivery.  Make a trial run to the hospital.  Pack your hospital bag.  Prepare the baby's nursery.  Continue to go to all your prenatal visits as directed by your caregiver. SEEK MEDICAL CARE IF:  You are unsure if you are in labor or if your water has broken.  You have dizziness.  You have mild pelvic cramps, pelvic pressure, or nagging pain in your abdominal area.  You have persistent nausea, vomiting, or diarrhea.  You have a bad smelling vaginal discharge.  You have pain with urination. SEEK IMMEDIATE MEDICAL CARE IF:   You have a fever.  You are leaking fluid from your vagina.  You have spotting or bleeding from your vagina.  You have severe abdominal cramping or pain.  You have rapid weight loss or gain.  You have shortness of breath with chest pain.  You notice sudden or extreme swelling   of your face, hands, ankles, feet, or legs.  You have not felt your baby move in over an hour.  You have severe headaches that do not go away with medicine.  You have vision changes. Document Released: 01/20/2001 Document Revised: 01/31/2013 Document Reviewed: 03/29/2012 [employer] Patient Information 2015 [employer]. This information is not intended to replace advice given to you by your health care provider. Make sure you discuss any questions you have with your health care provider.  

## 2014-01-25 NOTE — Progress Notes (Signed)
Fioricet RX printed. Called medication into patient's preferred pharmacy,

## 2014-01-25 NOTE — Progress Notes (Signed)
Patient reports pelvic pressure; also reports daily headaches since Saturday with dizziness and blurry vision; also reports vaginal itching on the outside

## 2014-01-30 LAB — SURESWAB BACTERIAL VAGINOSIS/ITIS
Atopobium vaginae: NOT DETECTED Log (cells/mL)
BV CATEGORY: UNDETERMINED — AB
C. GLABRATA, DNA: NOT DETECTED
C. albicans, DNA: NOT DETECTED
C. parapsilosis, DNA: NOT DETECTED
C. tropicalis, DNA: NOT DETECTED
Gardnerella vaginalis: 6 Log (cells/mL)
LACTOBACILLUS SPECIES: 7.8 Log (cells/mL)
MEGASPHAERA SPECIES: 6 Log (cells/mL)
T. vaginalis RNA, QL TMA: NOT DETECTED

## 2014-01-31 ENCOUNTER — Encounter: Payer: Self-pay | Admitting: Infectious Disease

## 2014-01-31 ENCOUNTER — Telehealth: Payer: Self-pay | Admitting: *Deleted

## 2014-01-31 ENCOUNTER — Inpatient Hospital Stay (HOSPITAL_COMMUNITY)
Admission: AD | Admit: 2014-01-31 | Discharge: 2014-01-31 | Disposition: A | Discharge status: Home / Self Care | Payer: Medicaid Other | Source: Ambulatory Visit | Attending: Family Medicine | Admitting: Family Medicine

## 2014-01-31 ENCOUNTER — Inpatient Hospital Stay (HOSPITAL_COMMUNITY): Payer: Medicaid Other

## 2014-01-31 ENCOUNTER — Ambulatory Visit (INDEPENDENT_AMBULATORY_CARE_PROVIDER_SITE_OTHER): Payer: Medicaid Other | Admitting: Family

## 2014-01-31 VITALS — BP 128/85 | HR 96 | Temp 98.5°F | Wt 240.9 lb

## 2014-01-31 DIAGNOSIS — Z21 Asymptomatic human immunodeficiency virus [HIV] infection status: Secondary | ICD-10-CM | POA: Diagnosis not present

## 2014-01-31 DIAGNOSIS — O98713 Human immunodeficiency virus [HIV] disease complicating pregnancy, third trimester: Secondary | ICD-10-CM | POA: Diagnosis not present

## 2014-01-31 DIAGNOSIS — O163 Unspecified maternal hypertension, third trimester: Secondary | ICD-10-CM

## 2014-01-31 DIAGNOSIS — R51 Headache: Secondary | ICD-10-CM

## 2014-01-31 DIAGNOSIS — O0993 Supervision of high risk pregnancy, unspecified, third trimester: Secondary | ICD-10-CM

## 2014-01-31 DIAGNOSIS — O9989 Other specified diseases and conditions complicating pregnancy, childbirth and the puerperium: Secondary | ICD-10-CM | POA: Diagnosis not present

## 2014-01-31 DIAGNOSIS — O10919 Unspecified pre-existing hypertension complicating pregnancy, unspecified trimester: Secondary | ICD-10-CM | POA: Insufficient documentation

## 2014-01-31 DIAGNOSIS — Z87891 Personal history of nicotine dependence: Secondary | ICD-10-CM | POA: Diagnosis not present

## 2014-01-31 DIAGNOSIS — O10013 Pre-existing essential hypertension complicating pregnancy, third trimester: Secondary | ICD-10-CM | POA: Insufficient documentation

## 2014-01-31 DIAGNOSIS — Z3A32 32 weeks gestation of pregnancy: Secondary | ICD-10-CM

## 2014-01-31 LAB — POCT URINALYSIS DIP (DEVICE)
BILIRUBIN URINE: NEGATIVE
Glucose, UA: NEGATIVE mg/dL
KETONES UR: NEGATIVE mg/dL
Leukocytes, UA: NEGATIVE
NITRITE: NEGATIVE
PH: 6 (ref 5.0–8.0)
Protein, ur: NEGATIVE mg/dL
Specific Gravity, Urine: 1.02 (ref 1.005–1.030)
Urobilinogen, UA: 0.2 mg/dL (ref 0.0–1.0)

## 2014-01-31 LAB — COMPREHENSIVE METABOLIC PANEL
ALT: 7 U/L (ref 0–35)
ANION GAP: 7 (ref 5–15)
AST: 11 U/L (ref 0–37)
Albumin: 2.6 g/dL — ABNORMAL LOW (ref 3.5–5.2)
Alkaline Phosphatase: 108 U/L (ref 39–117)
BUN: <5 mg/dL — ABNORMAL LOW (ref 6–23)
CO2: 22 mmol/L (ref 19–32)
Calcium: 8.3 mg/dL — ABNORMAL LOW (ref 8.4–10.5)
Chloride: 105 mEq/L (ref 96–112)
Creatinine, Ser: 0.4 mg/dL — ABNORMAL LOW (ref 0.50–1.10)
Glucose, Bld: 85 mg/dL (ref 70–99)
Potassium: 4 mmol/L (ref 3.5–5.1)
Sodium: 134 mmol/L — ABNORMAL LOW (ref 135–145)
Total Bilirubin: 0.5 mg/dL (ref 0.3–1.2)
Total Protein: 6 g/dL (ref 6.0–8.3)

## 2014-01-31 LAB — PROTEIN / CREATININE RATIO, URINE
Creatinine, Urine: 67 mg/dL
Protein Creatinine Ratio: 0.1 (ref 0.00–0.15)
TOTAL PROTEIN, URINE: 7 mg/dL

## 2014-01-31 LAB — CBC
HCT: 26.8 % — ABNORMAL LOW (ref 36.0–46.0)
HEMOGLOBIN: 8.9 g/dL — AB (ref 12.0–15.0)
MCH: 25.6 pg — AB (ref 26.0–34.0)
MCHC: 33.2 g/dL (ref 30.0–36.0)
MCV: 77.2 fL — ABNORMAL LOW (ref 78.0–100.0)
PLATELETS: 216 10*3/uL (ref 150–400)
RBC: 3.47 MIL/uL — AB (ref 3.87–5.11)
RDW: 12.6 % (ref 11.5–15.5)
WBC: 7.3 10*3/uL (ref 4.0–10.5)

## 2014-01-31 IMAGING — US US FETAL BPP W/O NONSTRESS
1 series · 13 of 28 positions shown · non-contrast
Comparison: none

[Series 1: us fetal bpp w/o nonstress · 0.22mm/px · 52 acquisitions, 13 frames shown]
[im 2/52]
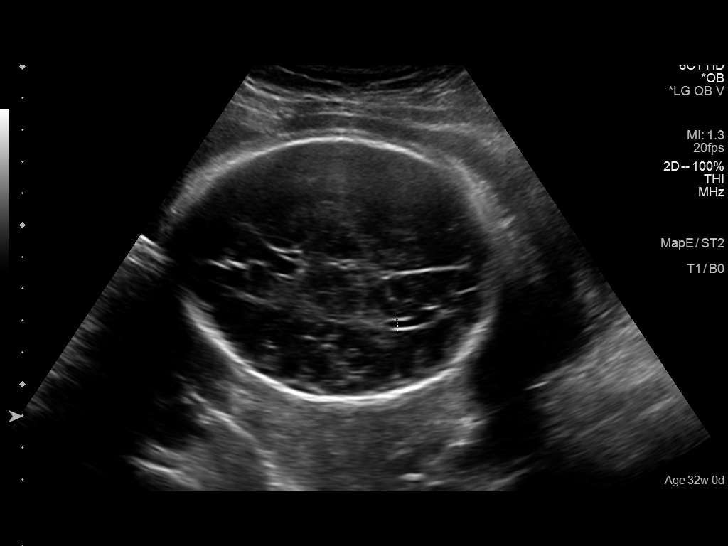
[im 6/52]
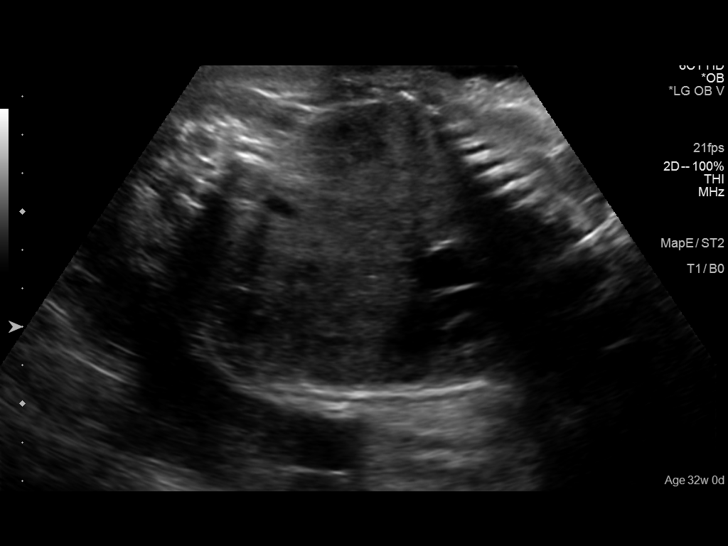
[im 10/52]
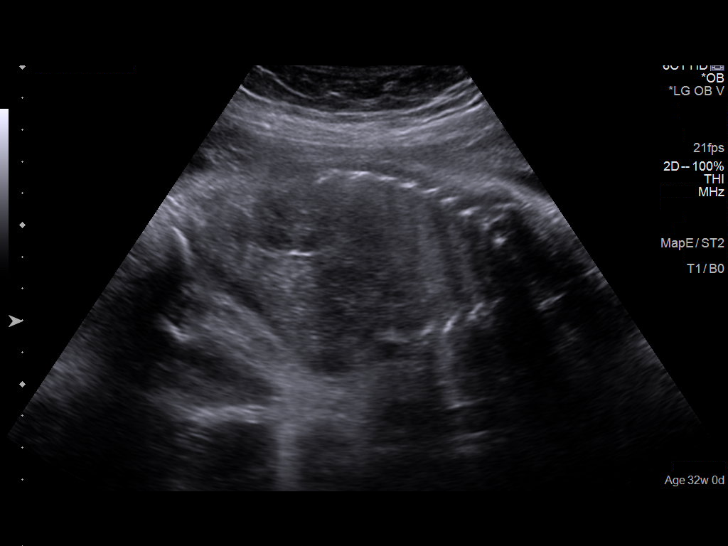
[im 14/52]
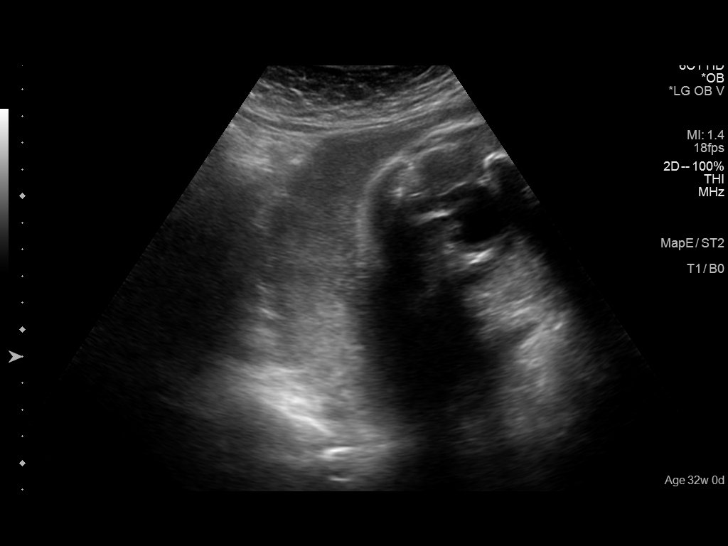
[im 18/52]
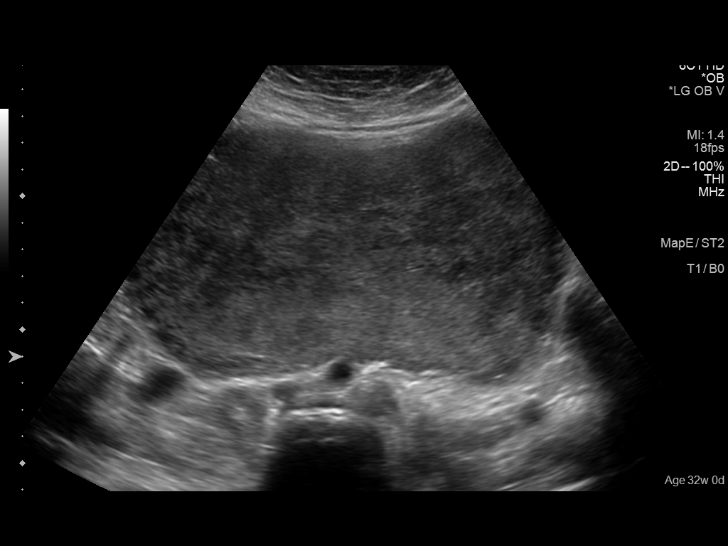
[im 21/52]
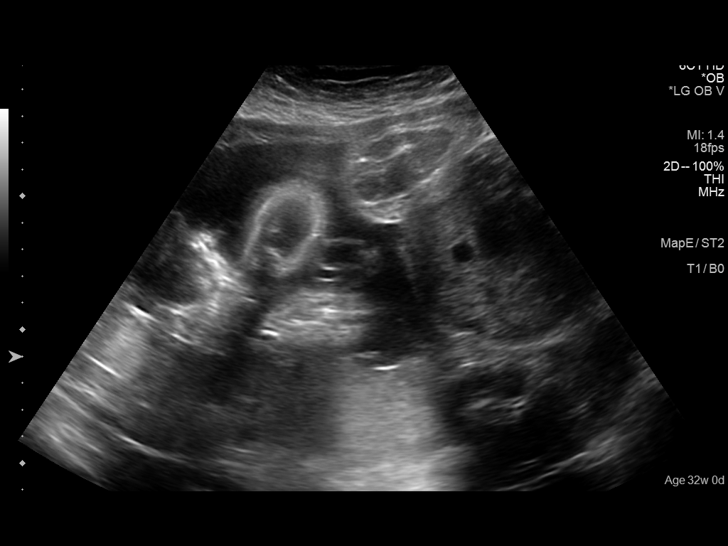
[im 27/52]
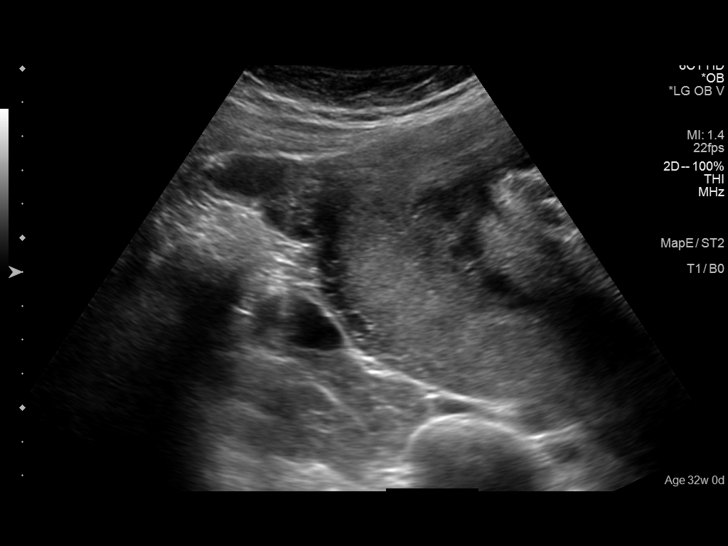
[im 31/52]
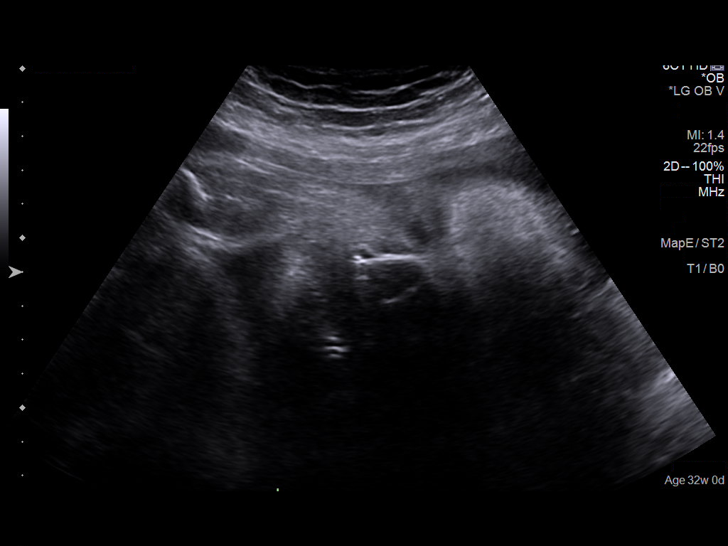
[im 35/52]
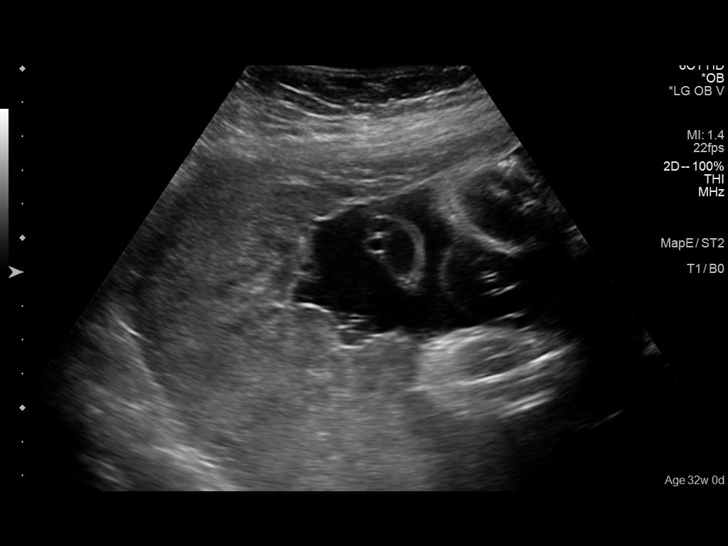
[im 38/52]
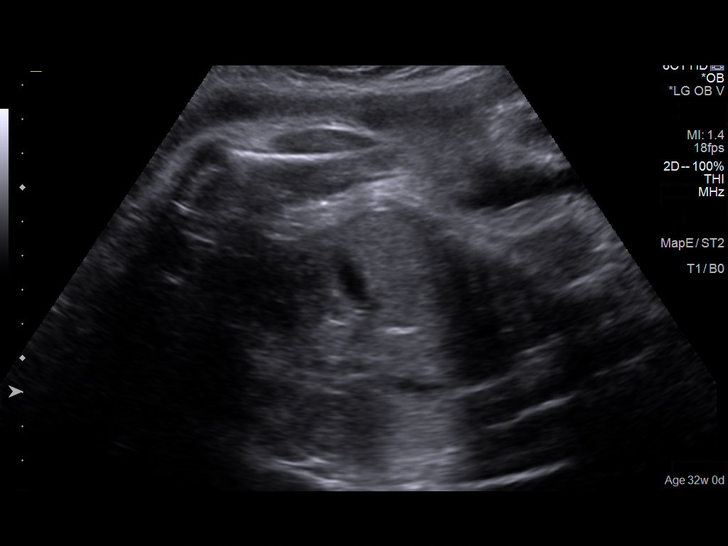
[im 42/52]
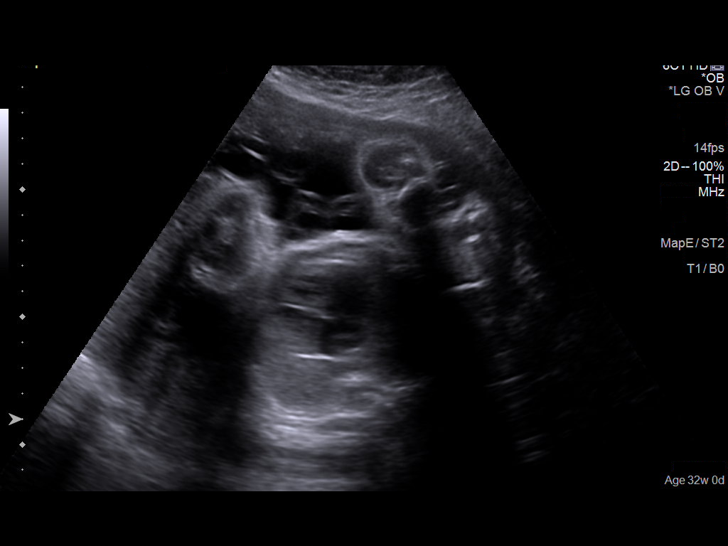
[im 46/52]
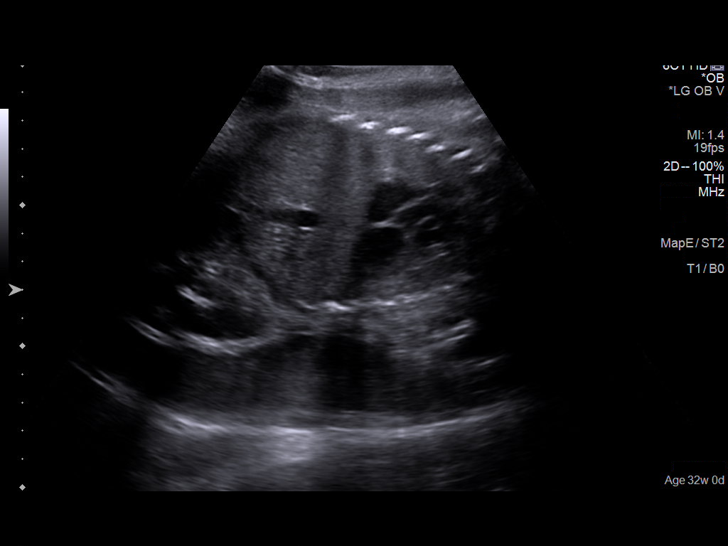
[im 50/52]
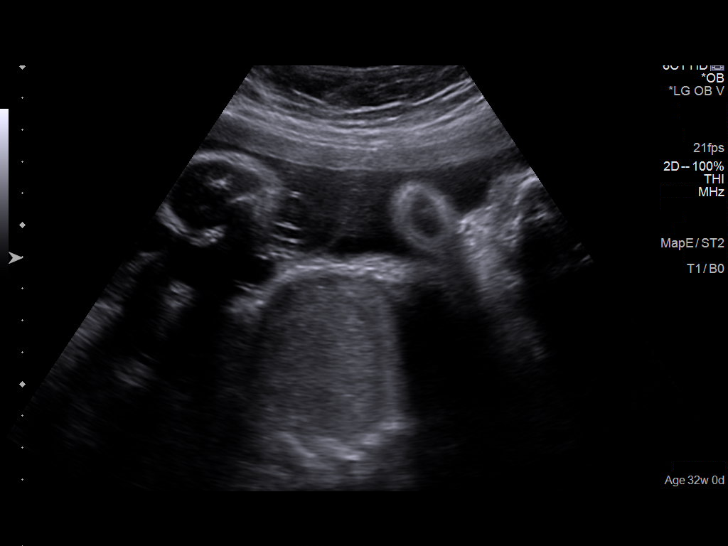

[13 of 28 positions shown; findings below may reference images not displayed]

OBSTETRICS REPORT
                      (Signed Final [DATE] [DATE])

Service(s) Provided

Indications

 Hypertension - Chronic/Pre-existing                   [MM]
 32 weeks gestation of pregnancy
 Maternal morbid obesity                               [MM] [MM]
 HIV: Asymptomatic                                     Z21
Fetal Evaluation

 Num Of Fetuses:    1
 Fetal Heart Rate:  136                          bpm
 Cardiac Activity:  Observed
 Presentation:      Cephalic
 Placenta:          Posterior, above cervical
                    os

 Amniotic Fluid
 AFI FV:      Subjectively within normal limits
                                             Larg Pckt:    4.61  cm
Biophysical Evaluation

 Amniotic F.V:   Within normal limits       F. Tone:        Observed
 F. Movement:    Observed                   Score:          [DATE]
 F. Breathing:   Observed
Gestational Age

 LMP:           32w 0d        Date:  [DATE]                 EDD:   [DATE]
 Best:          32w 0d     Det. By:  LMP  ([DATE])          EDD:   [DATE]
Cervix Uterus Adnexa

 Cervix:       Not visualized (advanced GA >[MM])
 Uterus:       No abnormality visualized.
 Cul De Sac:   No free fluid seen.

 Left Ovary:    No adnexal mass visualized.
 Right Ovary:   No adnexal mass visualized.
 Adnexa:     No adnexal mass visualized.
Impression

 SIUP at 32+0 weeks
 Normal amniotic fluid volume
 BPP [DATE]
Recommendations

 Continue antenatal testing
 Growth US in 
 1 - 2 weeks

## 2014-01-31 MED ORDER — MORPHINE SULFATE 4 MG/ML IJ SOLN
4.0000 mg | Freq: Once | INTRAMUSCULAR | Status: AC
  Administered 2014-01-31: 4 mg via INTRAVENOUS
  Filled 2014-01-31: qty 1

## 2014-01-31 MED ORDER — CYCLOBENZAPRINE HCL 10 MG PO TABS: 10.0000 mg | ORAL_TABLET | Freq: Every day | ORAL | Status: DC

## 2014-01-31 MED ORDER — LACTATED RINGERS IV BOLUS (SEPSIS)
1000.0000 mL | Freq: Once | INTRAVENOUS | Status: AC
  Administered 2014-01-31: 1000 mL via INTRAVENOUS

## 2014-01-31 MED ORDER — BUTALBITAL-APAP-CAFFEINE 50-500-40 MG PO TABS: 1.0000 | ORAL_TABLET | ORAL | Status: DC | PRN

## 2014-01-31 MED ORDER — ZOLPIDEM TARTRATE ER 6.25 MG PO TBCR: 6.2500 mg | EXTENDED_RELEASE_TABLET | Freq: Every evening | ORAL | Status: DC | PRN

## 2014-01-31 NOTE — Discharge Instructions (Signed)

## 2014-01-31 NOTE — MAU Provider Note (Signed)
History     CSN: [id number]  Arrival date and time: 01/31/14 0830   None     Chief Complaint  Patient presents with  . Hypertension   HPI  Patient is 28 y.o. [id number] 4280w0d with hx of HIV VL <20 here with complaints of headache currently 8/10, worse with light, worse in morning when she awakens, takes tylenol for headache.  Occasionally sleep helps headache. - also notes seeing spots, and some abdominal pain.  Bilateral hand pain: using CT braces at night  +FM, denies LOF, VB, contractions, vaginal discharge.    Past Medical History  Diagnosis Date  . Cholecystitis 07/16/10  . Hypertension   . HIV (human immunodeficiency virus infection)   . Genital warts 2004  . Sickle cell trait     Past Surgical History  Procedure Laterality Date  . Cholecystectomy    . Wisdom tooth extraction      Family History  Problem Relation Age of Onset  . Cancer Mother   . Diabetes Maternal Aunt     History  Substance Use Topics  . Smoking status: Former Smoker    Start date: 03/11/2012    Quit date: 08/24/2012  . Smokeless tobacco: Never Used  . Alcohol Use: No    Allergies:  Allergies  Allergen Reactions  . Stadol [Butorphanol Tartrate] Itching    Prescriptions prior to admission  Medication Sig Dispense Refill Last Dose  . acetaminophen (TYLENOL) 500 MG tablet Take 1,000 mg by mouth every 6 (six) hours as needed for mild pain. Stomach pains   01/30/2014 at Unknown time  . darunavir-cobicistat (PREZCOBIX) 800-150 MG per tablet Take 1 tablet by mouth daily. Swallow whole. Do NOT crush, break or chew tablets. Take with food. 30 tablet 11 01/30/2014 at 1000  . emtricitabine-tenofovir (TRUVADA) 200-300 MG per tablet Take 1 tablet by mouth daily. 30 tablet 11 01/30/2014 at Unknown time  . ondansetron (ZOFRAN-ODT) 8 MG disintegrating tablet DISSOLVE ONE TABLET BY MOUTH EVERY 8 HOURS AS NEEDED FOR NAUSEA OR VOMITING (Patient taking differently: Take 8 mg by mouth every 8 (eight)  hours as needed for nausea. ) 30 tablet 0 01/31/2014 at Unknown time  . Prenatal Vit-Fe Fumarate-FA (PRENATAL MULTIVITAMIN) TABS tablet Take 1 tablet by mouth daily at 12 noon.   01/30/2014 at Unknown time  . butalbital-acetaminophen-caffeine (ESGIC PLUS) 50-500-40 MG per tablet Take 1-2 tablets by mouth every 4 (four) hours as needed for pain or headache. (Patient not taking: Reported on 01/31/2014) 15 tablet 0 Not Taking  . cyclobenzaprine (FLEXERIL) 10 MG tablet Take 1 tablet (10 mg total) by mouth at bedtime. (Patient not taking: Reported on 01/31/2014) 30 tablet 2   . pantoprazole (PROTONIX) 40 MG tablet Take 1 tablet (40 mg total) by mouth daily. (Patient not taking: Reported on 01/31/2014) 30 tablet 2 Not Taking  . polyethylene glycol powder (GLYCOLAX/MIRALAX) powder Take 17 g by mouth daily. 850 g 0 01/29/2014    Review of Systems  Constitutional: Negative for fever and chills.  Respiratory: Negative for cough and shortness of breath.   Cardiovascular: Negative for chest pain and leg swelling.  Gastrointestinal: Positive for abdominal pain (RUQ). Negative for heartburn, nausea, vomiting and diarrhea.  Genitourinary: Negative for dysuria, urgency, frequency and hematuria.  Neurological: Positive for headaches.   Physical Exam   Blood pressure 127/62, pulse 87, temperature 97.6 F (36.4 C), temperature source Oral, resp. rate 18, height 5\' 1"  (1.549 m), weight 241 lb 6 oz (109.487 kg), last menstrual period 06/21/2013,  SpO2 98 %.  Physical Exam  Constitutional: She is oriented to person, place, and time. She appears well-developed and well-nourished.  HENT:  Head: Normocephalic and atraumatic.  Eyes: Conjunctivae and EOM are normal.  Neck: Normal range of motion.  Cardiovascular: Normal rate.   Respiratory: Effort normal. No respiratory distress.  GI: Soft. Bowel sounds are normal. She exhibits no distension. There is no tenderness (no RUQ TTP). There is no rebound.   Musculoskeletal: Normal range of motion. She exhibits no edema.  Neurological: She is alert and oriented to person, place, and time. She has normal reflexes.  Skin: Skin is warm and dry. No erythema.    MAU Course  Procedures  MDM NST reactive  Labs: Results for orders placed or performed during the hospital encounter of 01/31/14 (from the past 24 hour(s))  Protein / creatinine ratio, urine   Collection Time: 01/31/14  8:35 AM  Result Value Ref Range   Creatinine, Urine 67.00 mg/dL   Total Protein, Urine 7 mg/dL   Protein Creatinine Ratio 0.10 0.00 - 0.15  CBC   Collection Time: 01/31/14  9:00 AM  Result Value Ref Range   WBC 7.3 4.0 - 10.5 K/uL   RBC 3.47 (L) 3.87 - 5.11 MIL/uL   Hemoglobin 8.9 (L) 12.0 - 15.0 g/dL   HCT 11.9 (L) 14.7 - 82.9 %   MCV 77.2 (L) 78.0 - 100.0 fL   MCH 25.6 (L) 26.0 - 34.0 pg   MCHC 33.2 30.0 - 36.0 g/dL   RDW 56.2 13.0 - 86.5 %   Platelets 216 150 - 400 K/uL  Comprehensive metabolic panel   Collection Time: 01/31/14  9:00 AM  Result Value Ref Range   Sodium 134 (L) 135 - 145 mmol/L   Potassium 4.0 3.5 - 5.1 mmol/L   Chloride 105 96 - 112 mEq/L   CO2 22 19 - 32 mmol/L   Glucose, Bld 85 70 - 99 mg/dL   BUN <5 (L) 6 - 23 mg/dL   Creatinine, Ser 7.84 (L) 0.50 - 1.10 mg/dL   Calcium 8.3 (L) 8.4 - 10.5 mg/dL   Total Protein 6.0 6.0 - 8.3 g/dL   Albumin 2.6 (L) 3.5 - 5.2 g/dL   AST 11 0 - 37 U/L   ALT 7 0 - 35 U/L   Alkaline Phosphatase 108 39 - 117 U/L   Total Bilirubin 0.5 0.3 - 1.2 mg/dL   GFR calc non Af Amer >90 >90 mL/min   GFR calc Af Amer >90 >90 mL/min   Anion gap 7 5 - 15  Results for orders placed or performed in visit on 01/31/14 (from the past 24 hour(s))  POCT urinalysis dip (device)   Collection Time: 01/31/14  8:13 AM  Result Value Ref Range   Glucose, UA NEGATIVE NEGATIVE mg/dL   Bilirubin Urine NEGATIVE NEGATIVE   Ketones, ur NEGATIVE NEGATIVE mg/dL   Specific Gravity, Urine 1.020 1.005 - 1.030   Hgb urine dipstick  TRACE (A) NEGATIVE   pH 6.0 5.0 - 8.0   Protein, ur NEGATIVE NEGATIVE mg/dL   Urobilinogen, UA 0.2 0.0 - 1.0 mg/dL   Nitrite NEGATIVE NEGATIVE   Leukocytes, UA NEGATIVE NEGATIVE    Imaging Studies:  US Ob Follow Up  01/03/2014   OBSTETRICAL ULTRASOUND: This exam was performed within a [facility]. The OB US report was generated in the AS system, and faxed to the ordering physician.   This report is available in the [employer]. See the  [facility] Obstetric US report via the Image Link.  [place] Renal  01/15/2014   CLINICAL DATA:  Intermittent back pain, [redacted] weeks pregnant  EXAM: RENAL/URINARY TRACT ULTRASOUND COMPLETE  COMPARISON:  01/03/2014  FINDINGS: Right Kidney:  Length: 11.8 cm. Echogenicity within normal limits. No mass or hydronephrosis visualized.  Left Kidney:  Length: 11.5 cm. Mild central fullness of the collecting system is noted. This does not appear to extend into the proximal ureter.  Bladder:  Appears normal for degree of bladder distention. Bilateral ureteral jets are noted.  IMPRESSION: Mild left hydronephrosis without definitive hydroureter.   Electronically Signed   By: Alcide CleverMark  Lukens M.D.   On: 01/15/2014 18:22      Assessment and Plan  Patient is 28 y.o. [id number] 361w0d reporting headache, seeing spots likely secondary to migraine - fetal kick counts reinforced - preterm labor precautions - HA: likely with migraine, not sleeping well. ?component of OSA  Peripheral IV with 1L bolus, morphine 4mg  now, rx ambien 6.25mg  #10 no RF, fiorcet rx  No elevated pressures, however given pt reporting increased swelling/HA/RUQ pain preE labs ordered and were normal - bilateral hand pain: continue with CT braces    Tyesha Joffe ROCIO 01/31/2014, 9:59 AM

## 2014-01-31 NOTE — Telephone Encounter (Signed)
[facility] strength of esgic plus 50/500/40  , they only have 50/325/40. Substitution approved by Dr. Jolayne Pantheronstant

## 2014-01-31 NOTE — Progress Notes (Signed)
Reports intermittent headaches for a week.  Also reports intermittent RUQ pain.  +finger numbness x 2 weeks, increases when in bed.  Use bilateral wrist braces with some relief.   Pt crying in room due to headache > to MAU for eval.  Growth US scheduled.

## 2014-01-31 NOTE — Progress Notes (Signed)
Pt complains of daily headaches since the 12th with no relief from medication, reports blurry vision, and epigastric pain. Pt reports numbness in both hands.

## 2014-01-31 NOTE — MAU Note (Addendum)
Pt was seen in high risk clinic this morning.  Pt is HIV positive.  Sent to MAU after being seen in clinic for elevated BP, headache, Blurred vision and URQ abd pain which is intermittent.  Denies vaginal or ROM.   Good Fetal movement.  Pt was on meds for HTN prior to pregnancy and was told to discontinue this past October.

## 2014-02-01 DIAGNOSIS — O10919 Unspecified pre-existing hypertension complicating pregnancy, unspecified trimester: Secondary | ICD-10-CM | POA: Insufficient documentation

## 2014-02-05 ENCOUNTER — Ambulatory Visit (INDEPENDENT_AMBULATORY_CARE_PROVIDER_SITE_OTHER): Payer: Medicaid Other | Admitting: *Deleted

## 2014-02-05 VITALS — BP 123/82 | HR 94

## 2014-02-05 DIAGNOSIS — O10913 Unspecified pre-existing hypertension complicating pregnancy, third trimester: Secondary | ICD-10-CM

## 2014-02-06 NOTE — Progress Notes (Signed)
NST: Reactive tracing (Category 1) with baseline in 140s.  Moderate variability, multiple accelerations, no decelerations.  

## 2014-02-07 ENCOUNTER — Ambulatory Visit (HOSPITAL_COMMUNITY)
Admission: RE | Admit: 2014-02-07 | Discharge: 2014-02-07 | Disposition: A | Discharge status: Home / Self Care | Payer: Medicaid Other | Source: Ambulatory Visit | Attending: Family | Admitting: Family

## 2014-02-07 DIAGNOSIS — Z3A33 33 weeks gestation of pregnancy: Secondary | ICD-10-CM | POA: Diagnosis not present

## 2014-02-07 DIAGNOSIS — O0993 Supervision of high risk pregnancy, unspecified, third trimester: Secondary | ICD-10-CM

## 2014-02-07 DIAGNOSIS — Z21 Asymptomatic human immunodeficiency virus [HIV] infection status: Secondary | ICD-10-CM | POA: Insufficient documentation

## 2014-02-07 DIAGNOSIS — O99213 Obesity complicating pregnancy, third trimester: Secondary | ICD-10-CM | POA: Diagnosis not present

## 2014-02-07 DIAGNOSIS — O98713 Human immunodeficiency virus [HIV] disease complicating pregnancy, third trimester: Secondary | ICD-10-CM | POA: Diagnosis not present

## 2014-02-07 DIAGNOSIS — O10013 Pre-existing essential hypertension complicating pregnancy, third trimester: Secondary | ICD-10-CM | POA: Insufficient documentation

## 2014-02-07 DIAGNOSIS — O10913 Unspecified pre-existing hypertension complicating pregnancy, third trimester: Secondary | ICD-10-CM

## 2014-02-07 DIAGNOSIS — B20 Human immunodeficiency virus [HIV] disease: Secondary | ICD-10-CM

## 2014-02-07 IMAGING — US US OB FOLLOW-UP
1 series · 12 of 28 positions shown · non-contrast
Comparison: none

[Series 1: us ob follow up · 52 acquisitions, 12 frames shown]
[im 2/52]
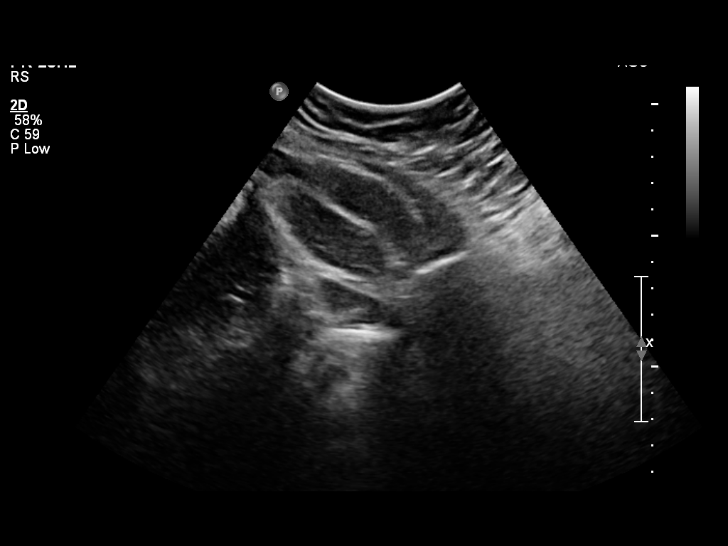
[im 6/52]
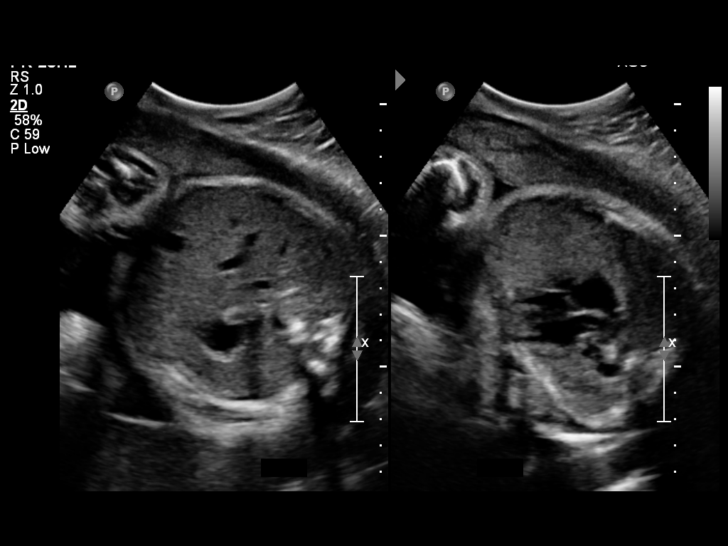
[im 10/52]
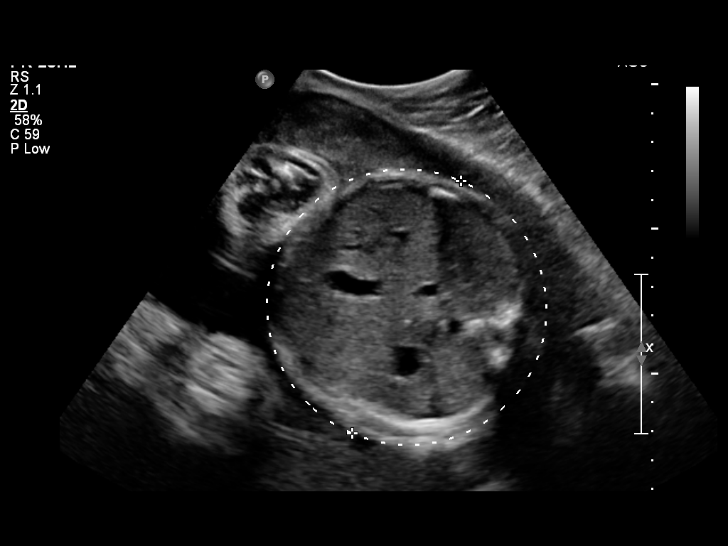
[im 16/52]
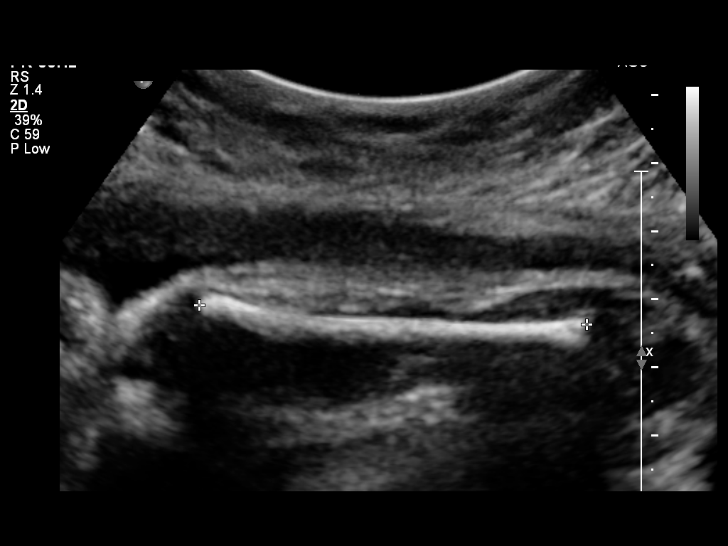
[im 19/52]
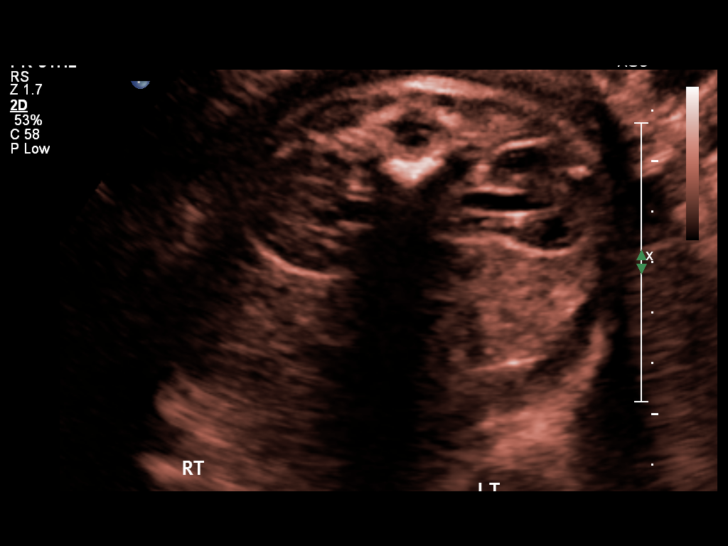
[im 23/52]
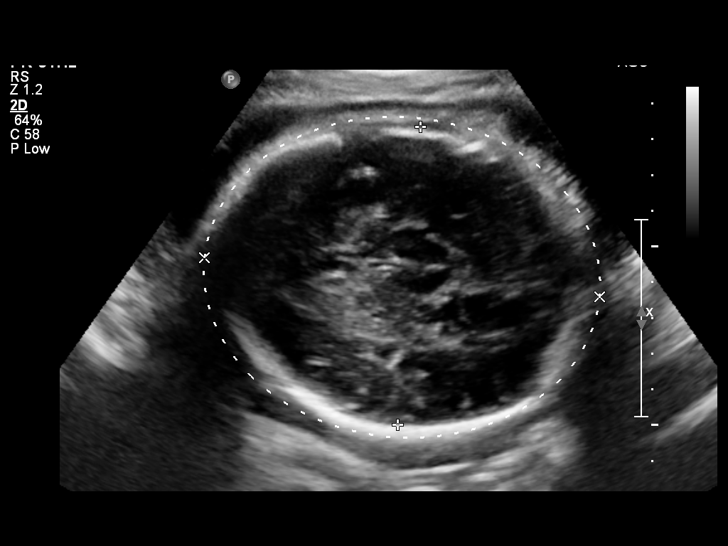
[im 29/52]
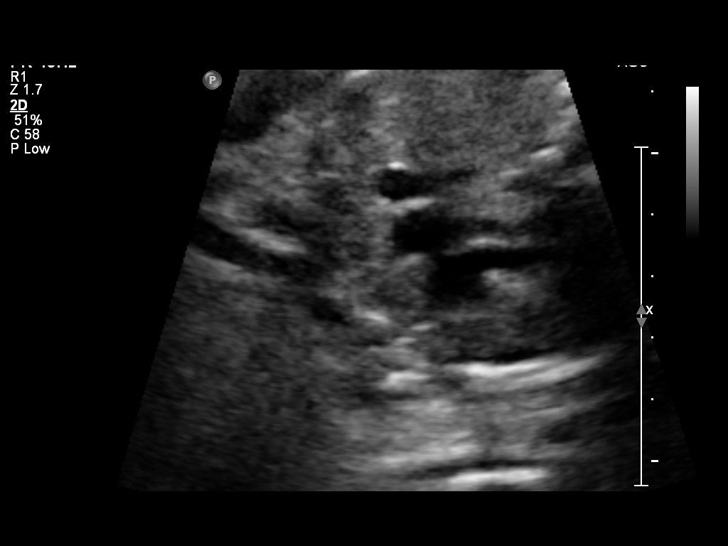
[im 33/52]
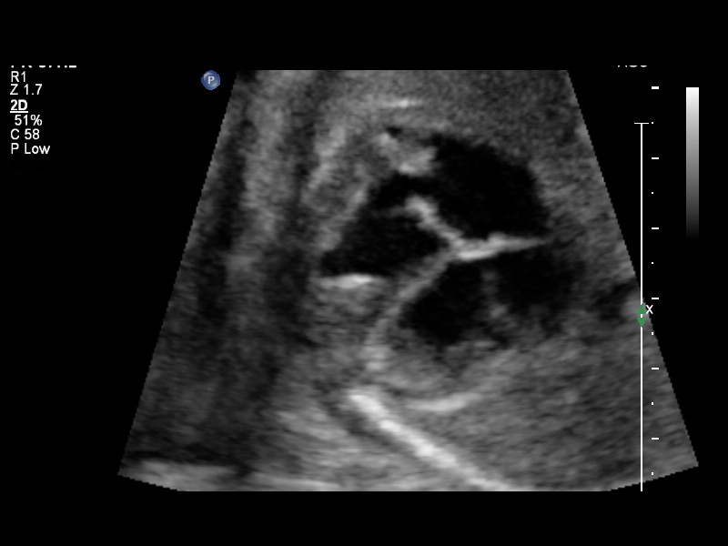
[im 36/52]
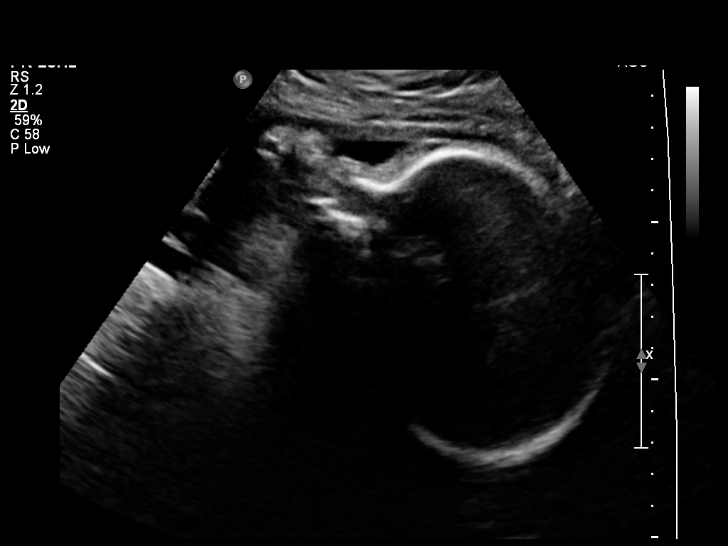
[im 42/52]
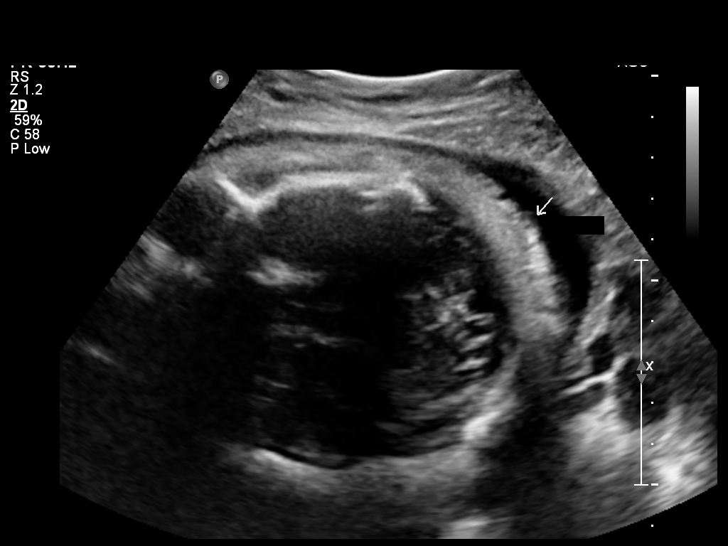
[im 46/52]
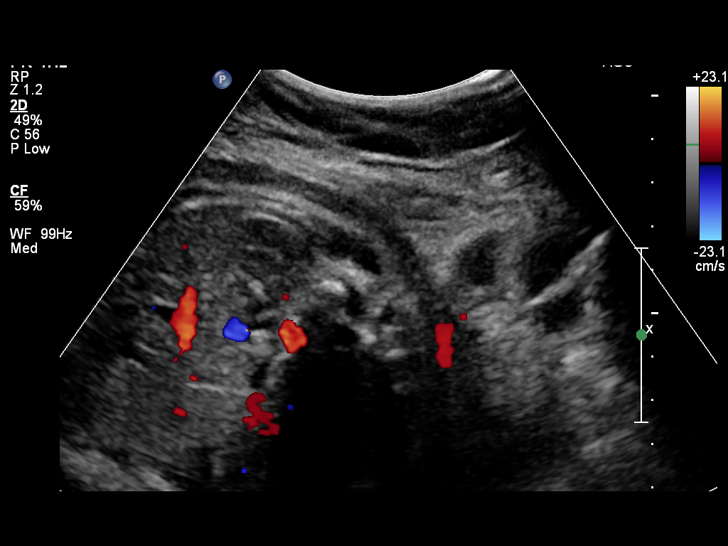
[im 50/52]
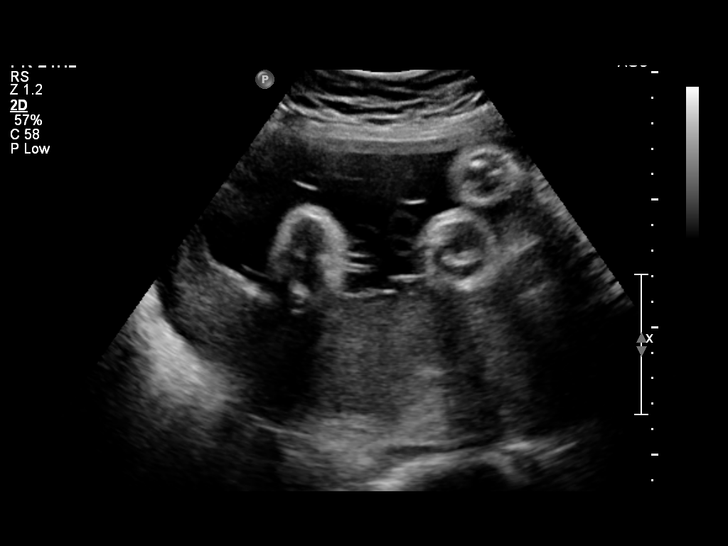

[12 of 28 positions shown; findings below may reference images not displayed]

OBSTETRICS REPORT
                      (Signed Final [DATE] [DATE])

Service(s) Provided

 US OB FOLLOW UP                                       76816.1
Indications

 Hypertension - Chronic/Pre-existing                   [97]
 Maternal morbid obesity                               [97] [97]
 HIV: Asymptomatic                                     Z21
 33 weeks gestation of pregnancy
Fetal Evaluation

 Num Of Fetuses:    1
 Fetal Heart Rate:  152                          bpm
 Cardiac Activity:  Observed
 Presentation:      Cephalic
 Placenta:          Posterior, above cervical
                    os
 P. Cord            Previously Visualized
 Insertion:

 Amniotic Fluid
 AFI FV:      Subjectively within normal limits
 AFI Sum:     11.54    cm      29  %Tile      Larg Pckt:   4.23  cm
 RUQ:   4.23    cm   RLQ:    3.86   cm    LLQ:    3.45   cm
Biometry

 BPD:     82.6   mm    G. Age:  33w 2d                CI:        70.55    70 - 86
                                                      FL/HC:       20.6   19.9 -

 HC:     313.5   mm    G. Age:  35w 1d       69   %   HC/AC:       1.04   0.96 -

 AC:     301.4   mm    G. Age:  34w 1d       80   %   FL/BPD:      78.1   71 - 87
 FL:      64.5   mm    G. Age:  33w 2d       46   %   FL/AC:       21.4   20 - 24
 HUM:       57   mm    G. Age:  33w 1d       61   %

 Est. FW:    [97]   gm     5 lb 1 oz     76  %
Gestational Age

 LMP:           33w 0d        Date:  [DATE]                 EDD:    [DATE]
 U/S Today:     34w 0d                                        EDD:    [DATE]
 Best:          33w 0d     Det. By:  LMP  ([DATE])          EDD:    [DATE]
Anatomy

 Cranium:          Appears normal         Aortic Arch:       Previously seen
 Fetal Cavum:      Previously seen        Ductal Arch:       Previously seen
 Ventricles:       Appears normal         Diaphragm:         Previously seen
 Choroid Plexus:   Previously seen        Stomach:           Appears normal, left
                                                             sided
 Cerebellum:       Previously seen        Abdomen:           Previously seen
 Posterior Fossa:  Previously seen        Abdominal Wall:    Previously seen
 Nuchal Fold:      Previously seen        Cord Vessels:      Previously seen
 Face:             Orbits and profile     Kidneys:           Appear normal
                   previously seen
 Lips:             Previously seen        Bladder:           Appears normal
 Heart:            Appears normal         Spine:             Previously seen
                   (4CH, axis, and
                   situs)
 RVOT:             Previously seen        Lower              Previously seen
                                          Extremities:
 LVOT:             Appears normal         Upper              Previously seen
                                          Extremities:

 Other:  Fetus appears to be a male. Heels and 5th digit previously visualized.
Targeted Anatomy

 Fetal Central Nervous System
 Lat. Ventricles:
Cervix Uterus Adnexa

 Cervix:       Not visualized (advanced GA >[97])
 Uterus:       No abnormality visualized.
 Left Ovary:    Not visualized.
 Right Ovary:   Not visualized.
 Adnexa:     No abnormality visualized. No adnexal mass visualized.
Impression

 SIUP at 33+0 weeks
 Normal interval anatomy; anatomic survey complete
 Normal amniotic fluid volume
 Appropriate interval growth with EFW at the 76th %tile
Recommendations

 Follow-up ultrasound for growth in 4 weeks
 Continue antenatal testing

## 2014-02-08 ENCOUNTER — Ambulatory Visit (INDEPENDENT_AMBULATORY_CARE_PROVIDER_SITE_OTHER): Payer: Medicaid Other | Admitting: Obstetrics & Gynecology

## 2014-02-08 ENCOUNTER — Other Ambulatory Visit: Payer: Self-pay | Admitting: Obstetrics & Gynecology

## 2014-02-08 VITALS — BP 120/72 | HR 109 | Temp 98.3°F | Wt 241.8 lb

## 2014-02-08 DIAGNOSIS — O163 Unspecified maternal hypertension, third trimester: Secondary | ICD-10-CM

## 2014-02-08 DIAGNOSIS — O10913 Unspecified pre-existing hypertension complicating pregnancy, third trimester: Secondary | ICD-10-CM

## 2014-02-08 LAB — POCT URINALYSIS DIP (DEVICE)
Bilirubin Urine: NEGATIVE
Glucose, UA: NEGATIVE mg/dL
HGB URINE DIPSTICK: NEGATIVE
Ketones, ur: NEGATIVE mg/dL
Nitrite: NEGATIVE
PROTEIN: NEGATIVE mg/dL
Specific Gravity, Urine: 1.02 (ref 1.005–1.030)
UROBILINOGEN UA: 0.2 mg/dL (ref 0.0–1.0)
pH: 7 (ref 5.0–8.0)

## 2014-02-08 MED ORDER — FLUCONAZOLE 150 MG PO TABS: 150.0000 mg | ORAL_TABLET | Freq: Once | ORAL | Status: DC

## 2014-02-08 NOTE — Progress Notes (Signed)
Still had headache, will refer to [facility] for evaluation. Vaginal itch and d/c, c/w yeats, rx diflucan. NST reactive today

## 2014-02-08 NOTE — Progress Notes (Signed)
C/o itching around perineal area.

## 2014-02-08 NOTE — Patient Instructions (Signed)
Third Trimester of Pregnancy The third trimester is from week 29 through week 42, months 7 through 9. The third trimester is a time when the fetus is growing rapidly. At the end of the ninth month, the fetus is about 20 inches in length and weighs 6-10 pounds.  BODY CHANGES Your body goes through many changes during pregnancy. The changes vary from woman to woman.   Your weight will continue to increase. You can expect to gain 25-35 pounds (11-16 kg) by the end of the pregnancy.  You may begin to get stretch marks on your hips, abdomen, and breasts.  You may urinate more often because the fetus is moving lower into your pelvis and pressing on your bladder.  You may develop or continue to have heartburn as a result of your pregnancy.  You may develop constipation because certain hormones are causing the muscles that push waste through your intestines to slow down.  You may develop hemorrhoids or swollen, bulging veins (varicose veins).  You may have pelvic pain because of the weight gain and pregnancy hormones relaxing your joints between the bones in your pelvis. Backaches may result from overexertion of the muscles supporting your posture.  You may have changes in your hair. These can include thickening of your hair, rapid growth, and changes in texture. Some women also have hair loss during or after pregnancy, or hair that feels dry or thin. Your hair will most likely return to normal after your baby is born.  Your breasts will continue to grow and be tender. A yellow discharge may leak from your breasts called colostrum.  Your belly button may stick out.  You may feel short of breath because of your expanding uterus.  You may notice the fetus "dropping," or moving lower in your abdomen.  You may have a bloody mucus discharge. This usually occurs a few days to a week before labor begins.  Your cervix becomes thin and soft (effaced) near your due date. WHAT TO EXPECT AT YOUR PRENATAL  EXAMS  You will have prenatal exams every 2 weeks until week 36. Then, you will have weekly prenatal exams. During a routine prenatal visit:  You will be weighed to make sure you and the fetus are growing normally.  Your blood pressure is taken.  Your abdomen will be measured to track your baby's growth.  The fetal heartbeat will be listened to.  Any test results from the previous visit will be discussed.  You may have a cervical check near your due date to see if you have effaced. At around 36 weeks, your caregiver will check your cervix. At the same time, your caregiver will also perform a test on the secretions of the vaginal tissue. This test is to determine if a type of bacteria, Group B streptococcus, is present. Your caregiver will explain this further. Your caregiver may ask you:  What your birth plan is.  How you are feeling.  If you are feeling the baby move.  If you have had any abnormal symptoms, such as leaking fluid, bleeding, severe headaches, or abdominal cramping.  If you have any questions. Other tests or screenings that may be performed during your third trimester include:  Blood tests that check for low iron levels (anemia).  Fetal testing to check the health, activity level, and growth of the fetus. Testing is done if you have certain medical conditions or if there are problems during the pregnancy. FALSE LABOR You may feel small, irregular contractions that   eventually go away. These are called Braxton Hicks contractions, or false labor. Contractions may last for hours, days, or even weeks before true labor sets in. If contractions come at regular intervals, intensify, or become painful, it is best to be seen by your caregiver.  SIGNS OF LABOR   Menstrual-like cramps.  Contractions that are 5 minutes apart or less.  Contractions that start on the top of the uterus and spread down to the lower abdomen and back.  A sense of increased pelvic pressure or back  pain.  A watery or bloody mucus discharge that comes from the vagina. If you have any of these signs before the 37th week of pregnancy, call your caregiver right away. You need to go to the hospital to get checked immediately. HOME CARE INSTRUCTIONS   Avoid all smoking, herbs, alcohol, and unprescribed drugs. These chemicals affect the formation and growth of the baby.  Follow your caregiver's instructions regarding medicine use. There are medicines that are either safe or unsafe to take during pregnancy.  Exercise only as directed by your caregiver. Experiencing uterine cramps is a good sign to stop exercising.  Continue to eat regular, healthy meals.  Wear a good support bra for breast tenderness.  Do not use hot tubs, steam rooms, or saunas.  Wear your seat belt at all times when driving.  Avoid raw meat, uncooked cheese, cat litter boxes, and soil used by cats. These carry germs that can cause birth defects in the baby.  Take your prenatal vitamins.  Try taking a stool softener (if your caregiver approves) if you develop constipation. Eat more high-fiber foods, such as fresh vegetables or fruit and whole grains. Drink plenty of fluids to keep your urine clear or pale yellow.  Take warm sitz baths to soothe any pain or discomfort caused by hemorrhoids. Use hemorrhoid cream if your caregiver approves.  If you develop varicose veins, wear support hose. Elevate your feet for 15 minutes, 3-4 times a day. Limit salt in your diet.  Avoid heavy lifting, wear low heal shoes, and practice good posture.  Rest a lot with your legs elevated if you have leg cramps or low back pain.  Visit your dentist if you have not gone during your pregnancy. Use a soft toothbrush to brush your teeth and be gentle when you floss.  A sexual relationship may be continued unless your caregiver directs you otherwise.  Do not travel far distances unless it is absolutely necessary and only with the approval  of your caregiver.  Take prenatal classes to understand, practice, and ask questions about the labor and delivery.  Make a trial run to the hospital.  Pack your hospital bag.  Prepare the baby's nursery.  Continue to go to all your prenatal visits as directed by your caregiver. SEEK MEDICAL CARE IF:  You are unsure if you are in labor or if your water has broken.  You have dizziness.  You have mild pelvic cramps, pelvic pressure, or nagging pain in your abdominal area.  You have persistent nausea, vomiting, or diarrhea.  You have a bad smelling vaginal discharge.  You have pain with urination. SEEK IMMEDIATE MEDICAL CARE IF:   You have a fever.  You are leaking fluid from your vagina.  You have spotting or bleeding from your vagina.  You have severe abdominal cramping or pain.  You have rapid weight loss or gain.  You have shortness of breath with chest pain.  You notice sudden or extreme swelling   of your face, hands, ankles, feet, or legs.  You have not felt your baby move in over an hour.  You have severe headaches that do not go away with medicine.  You have vision changes. Document Released: 01/20/2001 Document Revised: 01/31/2013 Document Reviewed: 03/29/2012 [employer] Patient Information 2015 [employer]. This information is not intended to replace advice given to you by your health care provider. Make sure you discuss any questions you have with your health care provider.  

## 2014-02-09 NOTE — L&D Delivery Note (Signed)
Delivery Note At 11:35 AM a viable female was delivered via Vaginal, Spontaneous Delivery (Presentation: Middle Occiput Posterior).  APGAR: 9, 9; weight 7 lb 6.5 oz (3360 g).   Placenta status: Intact, Spontaneous.  Cord: 3 vessels with the following complications: None.  Patient a [id number] @ 6012w6d who is HIV+. Received proper treatment for this. No complications at delivery.  Anesthesia: Epidural  Episiotomy: None Lacerations: 1st degree;Perineal Suture Repair: 3.0 vicryl rapide Est. Blood Loss (mL): 350  Mom to postpartum.  Baby to Couplet care / Skin to Skin.  Kathee DeltonMcKeag, Ian D 03/20/2014, 1:07 PM

## 2014-02-12 ENCOUNTER — Ambulatory Visit (INDEPENDENT_AMBULATORY_CARE_PROVIDER_SITE_OTHER): Payer: Medicaid Other | Admitting: *Deleted

## 2014-02-12 VITALS — BP 132/77 | HR 99

## 2014-02-12 DIAGNOSIS — O10913 Unspecified pre-existing hypertension complicating pregnancy, third trimester: Secondary | ICD-10-CM

## 2014-02-13 ENCOUNTER — Encounter: Payer: Self-pay | Admitting: Internal Medicine

## 2014-02-13 ENCOUNTER — Ambulatory Visit (INDEPENDENT_AMBULATORY_CARE_PROVIDER_SITE_OTHER): Payer: Medicaid Other | Admitting: Internal Medicine

## 2014-02-13 VITALS — BP 118/82 | HR 99 | Temp 97.2°F | Wt 244.0 lb

## 2014-02-13 DIAGNOSIS — B20 Human immunodeficiency virus [HIV] disease: Secondary | ICD-10-CM

## 2014-02-13 DIAGNOSIS — F329 Major depressive disorder, single episode, unspecified: Secondary | ICD-10-CM

## 2014-02-13 DIAGNOSIS — O98719 Human immunodeficiency virus [HIV] disease complicating pregnancy, unspecified trimester: Secondary | ICD-10-CM

## 2014-02-13 DIAGNOSIS — Z006 Encounter for examination for normal comparison and control in clinical research program: Secondary | ICD-10-CM

## 2014-02-13 DIAGNOSIS — O99343 Other mental disorders complicating pregnancy, third trimester: Secondary | ICD-10-CM

## 2014-02-13 DIAGNOSIS — R110 Nausea: Secondary | ICD-10-CM

## 2014-02-13 LAB — SURESWAB BACTERIAL VAGINOSIS/ITIS
Atopobium vaginae: NOT DETECTED Log (cells/mL)
C. GLABRATA, DNA: NOT DETECTED
C. TROPICALIS, DNA: NOT DETECTED
C. albicans, DNA: DETECTED — AB
C. parapsilosis, DNA: NOT DETECTED
Gardnerella vaginalis: 4.7 Log (cells/mL)
LACTOBACILLUS SPECIES: 7.8 Log (cells/mL)
MEGASPHAERA SPECIES: NOT DETECTED Log (cells/mL)
T. vaginalis RNA, QL TMA: NOT DETECTED

## 2014-02-13 NOTE — Progress Notes (Signed)
Patient ID: Catherine Simmons, female   DOB: 07-Oct-1985, 29 y.o.   MRN: [id number]       Patient ID: Catherine Simmons, female   DOB: Oct 13, 1985, 29 y.o.   MRN: [id number]  HPI  28yo f with HIV, CD 4 count of 651/VL<20 on prezcobix+ truvada at [redacted] wks gestation. Looking forward to having baby. Difficulty now with sleeping, headache, nausea, constipation. She is doing well with continue to take her HIV medication. She expresses feeling poorly with all her symptoms associated with her pregnancy, feeling down. Outpatient Encounter Prescriptions as of 02/13/2014  Medication Sig  . acetaminophen (TYLENOL) 500 MG tablet Take 1,000 mg by mouth every 6 (six) hours as needed for mild pain. Stomach pains  . butalbital-acetaminophen-caffeine (ESGIC PLUS) 50-500-40 MG per tablet Take 1-2 tablets by mouth every 4 (four) hours as needed for pain or headache.  . cyclobenzaprine (FLEXERIL) 10 MG tablet Take 1 tablet (10 mg total) by mouth at bedtime.  . darunavir-cobicistat (PREZCOBIX) 800-150 MG per tablet Take 1 tablet by mouth daily. Swallow whole. Do NOT crush, break or chew tablets. Take with food.  Marland Kitchen emtricitabine-tenofovir (TRUVADA) 200-300 MG per tablet Take 1 tablet by mouth daily.  . fluconazole (DIFLUCAN) 150 MG tablet Take 1 tablet (150 mg total) by mouth once.  . ondansetron (ZOFRAN-ODT) 8 MG disintegrating tablet DISSOLVE ONE TABLET BY MOUTH EVERY 8 HOURS AS NEEDED FOR NAUSEA OR VOMITING (Patient taking differently: Take 8 mg by mouth every 8 (eight) hours as needed for nausea. )  . pantoprazole (PROTONIX) 40 MG tablet Take 1 tablet (40 mg total) by mouth daily.  . polyethylene glycol powder (GLYCOLAX/MIRALAX) powder Take 17 g by mouth daily.  . Prenatal Vit-Fe Fumarate-FA (PRENATAL MULTIVITAMIN) TABS tablet Take 1 tablet by mouth daily at 12 noon.  Marland Kitchen zolpidem (AMBIEN CR) 6.25 MG CR tablet Take 1 tablet (6.25 mg total) by mouth at bedtime as needed for sleep.     Patient Active Problem List   Diagnosis Date Noted  . [redacted] weeks gestation of pregnancy   . Obesity affecting pregnancy in third trimester, antepartum   . [redacted] weeks gestation of pregnancy   . Preexisting hypertension complicating pregnancy, antepartum   . [redacted] weeks gestation of pregnancy   . HIV disease affecting pregnancy, antepartum 11/16/2013  . Supervision of high-risk pregnancy 08/31/2013  . HTN in pregnancy, chronic 08/31/2013  . Hx of pelvic inflammatory disease 08/31/2013  . Anxiety 06/30/2013  . Human immunodeficiency virus (HIV) disease 10/12/2012  . Other disorder of menstruation and other abnormal bleeding from female genital tract 10/07/2012  . Pelvic pain 06/03/2012  . CARPAL TUNNEL SYNDROME 11/14/2009  . NUMMULAR ECZEMA 11/14/2009  . AMENORRHEA 06/25/2009  . OBESITY 05/09/2008  . SMOKER 05/09/2008  . DEPRESSION 05/09/2008     There are no preventive care reminders to display for this patient.   Review of Systems Per hpi Physical Exam   BP 118/82 mmHg  Pulse 99  Temp(Src) 97.2 F (36.2 C) (Oral)  Wt 244 lb (110.678 kg)  LMP 06/21/2013 Physical Exam  Constitutional:  oriented to person, place, and time. appears well-developed and well-nourished. No distress.  HENT:  Mouth/Throat: Oropharynx is clear and moist. No oropharyngeal exudate.  Cardiovascular: Normal rate, regular rhythm and normal heart sounds. Exam reveals no gallop and no friction rub.  No murmur heard.  Pulmonary/Chest: Effort normal and breath sounds normal. No respiratory distress.  has no wheezes.  Abdominal: Soft. Bowel sounds are normal.  exhibits no  distension. There is no tenderness. Gravid abdomen Lymphadenopathy: no cervical adenopathy.  Neurological: alert and oriented to person, place, and time.  Skin: Skin is warm and dry. No rash noted. No erythema.  Psychiatric: mood is flat   Lab Results  Component Value Date   CD4TCELL 37 10/12/2013   Lab Results  Component Value Date   CD4TABS 651 12/27/2013   CD4TABS  701 12/09/2013   CD4TABS 600 10/12/2013   Lab Results  Component Value Date   HIV1RNAQUANT <20 10/12/2013   Lab Results  Component Value Date   HEPBSAB POS* 10/20/2012   No results found for: RPR  CBC Lab Results  Component Value Date   WBC 7.3 01/31/2014   RBC 3.47* 01/31/2014   HGB 8.9* 01/31/2014   HCT 26.8* 01/31/2014   PLT 216 01/31/2014   MCV 77.2* 01/31/2014   MCH 25.6* 01/31/2014   MCHC 33.2 01/31/2014   RDW 12.6 01/31/2014   LYMPHSABS 1.5 01/10/2014   MONOABS 0.6 01/10/2014   EOSABS 0.1 01/10/2014   BASOSABS 0.0 01/10/2014   BMET Lab Results  Component Value Date   NA 134* 01/31/2014   K 4.0 01/31/2014   CL 105 01/31/2014   CO2 22 01/31/2014   GLUCOSE 85 01/31/2014   BUN <5* 01/31/2014   CREATININE 0.40* 01/31/2014   CALCIUM 8.3* 01/31/2014   GFRNONAA >90 01/31/2014   GFRAA >90 01/31/2014     Assessment and Plan hiv = continue on current regimen. She is enrolled in research trial with her current regimen while pregnant. She follows up next week her next visit with research team.  Depression = concern she is developing pre-pregnancy depression. She states that she has no intention for harm to herself or others. She is overall fatigued, due to poor sleep and other constellation of symptoms  Pregnancy = continue with prenatal vitamin  Nausea = continue with prn antiemetic  rtc in 3 months

## 2014-02-14 ENCOUNTER — Encounter: Payer: Medicaid Other | Admitting: Advanced Practice Midwife

## 2014-02-15 ENCOUNTER — Encounter: Payer: Self-pay | Admitting: Family Medicine

## 2014-02-15 ENCOUNTER — Ambulatory Visit (INDEPENDENT_AMBULATORY_CARE_PROVIDER_SITE_OTHER): Payer: Medicaid Other | Admitting: Family Medicine

## 2014-02-15 VITALS — BP 118/78 | HR 108 | Temp 98.0°F | Wt 242.9 lb

## 2014-02-15 DIAGNOSIS — O163 Unspecified maternal hypertension, third trimester: Secondary | ICD-10-CM

## 2014-02-15 DIAGNOSIS — O98713 Human immunodeficiency virus [HIV] disease complicating pregnancy, third trimester: Secondary | ICD-10-CM

## 2014-02-15 DIAGNOSIS — O0993 Supervision of high risk pregnancy, unspecified, third trimester: Secondary | ICD-10-CM

## 2014-02-15 DIAGNOSIS — B20 Human immunodeficiency virus [HIV] disease: Secondary | ICD-10-CM

## 2014-02-15 DIAGNOSIS — O10913 Unspecified pre-existing hypertension complicating pregnancy, third trimester: Secondary | ICD-10-CM

## 2014-02-15 LAB — POCT URINALYSIS DIP (DEVICE)
BILIRUBIN URINE: NEGATIVE
GLUCOSE, UA: NEGATIVE mg/dL
Hgb urine dipstick: NEGATIVE
KETONES UR: NEGATIVE mg/dL
LEUKOCYTES UA: NEGATIVE
Nitrite: NEGATIVE
Protein, ur: NEGATIVE mg/dL
Specific Gravity, Urine: 1.015 (ref 1.005–1.030)
UROBILINOGEN UA: 0.2 mg/dL (ref 0.0–1.0)
pH: 5.5 (ref 5.0–8.0)

## 2014-02-15 LAB — US OB FOLLOW UP

## 2014-02-15 MED ORDER — FLUCONAZOLE 150 MG PO TABS: 150.0000 mg | ORAL_TABLET | Freq: Once | ORAL | Status: DC

## 2014-02-15 NOTE — Patient Instructions (Signed)
Third Trimester of Pregnancy The third trimester is from week 29 through week 42, months 7 through 9. The third trimester is a time when the fetus is growing rapidly. At the end of the ninth month, the fetus is about 20 inches in length and weighs 6-10 pounds.  BODY CHANGES Your body goes through many changes during pregnancy. The changes vary from woman to woman.   Your weight will continue to increase. You can expect to gain 25-35 pounds (11-16 kg) by the end of the pregnancy.  You may begin to get stretch marks on your hips, abdomen, and breasts.  You may urinate more often because the fetus is moving lower into your pelvis and pressing on your bladder.  You may develop or continue to have heartburn as a result of your pregnancy.  You may develop constipation because certain hormones are causing the muscles that push waste through your intestines to slow down.  You may develop hemorrhoids or swollen, bulging veins (varicose veins).  You may have pelvic pain because of the weight gain and pregnancy hormones relaxing your joints between the bones in your pelvis. Backaches may result from overexertion of the muscles supporting your posture.  You may have changes in your hair. These can include thickening of your hair, rapid growth, and changes in texture. Some women also have hair loss during or after pregnancy, or hair that feels dry or thin. Your hair will most likely return to normal after your baby is born.  Your breasts will continue to grow and be tender. A yellow discharge may leak from your breasts called colostrum.  Your belly button may stick out.  You may feel short of breath because of your expanding uterus.  You may notice the fetus "dropping," or moving lower in your abdomen.  You may have a bloody mucus discharge. This usually occurs a few days to a week before labor begins.  Your cervix becomes thin and soft (effaced) near your due date. WHAT TO EXPECT AT YOUR PRENATAL  EXAMS  You will have prenatal exams every 2 weeks until week 36. Then, you will have weekly prenatal exams. During a routine prenatal visit:  You will be weighed to make sure you and the fetus are growing normally.  Your blood pressure is taken.  Your abdomen will be measured to track your baby's growth.  The fetal heartbeat will be listened to.  Any test results from the previous visit will be discussed.  You may have a cervical check near your due date to see if you have effaced. At around 36 weeks, your caregiver will check your cervix. At the same time, your caregiver will also perform a test on the secretions of the vaginal tissue. This test is to determine if a type of bacteria, Group B streptococcus, is present. Your caregiver will explain this further. Your caregiver may ask you:  What your birth plan is.  How you are feeling.  If you are feeling the baby move.  If you have had any abnormal symptoms, such as leaking fluid, bleeding, severe headaches, or abdominal cramping.  If you have any questions. Other tests or screenings that may be performed during your third trimester include:  Blood tests that check for low iron levels (anemia).  Fetal testing to check the health, activity level, and growth of the fetus. Testing is done if you have certain medical conditions or if there are problems during the pregnancy. FALSE LABOR You may feel small, irregular contractions that   eventually go away. These are called Braxton Hicks contractions, or false labor. Contractions may last for hours, days, or even weeks before true labor sets in. If contractions come at regular intervals, intensify, or become painful, it is best to be seen by your caregiver.  SIGNS OF LABOR   Menstrual-like cramps.  Contractions that are 5 minutes apart or less.  Contractions that start on the top of the uterus and spread down to the lower abdomen and back.  A sense of increased pelvic pressure or back  pain.  A watery or bloody mucus discharge that comes from the vagina. If you have any of these signs before the 37th week of pregnancy, call your caregiver right away. You need to go to the hospital to get checked immediately. HOME CARE INSTRUCTIONS   Avoid all smoking, herbs, alcohol, and unprescribed drugs. These chemicals affect the formation and growth of the baby.  Follow your caregiver's instructions regarding medicine use. There are medicines that are either safe or unsafe to take during pregnancy.  Exercise only as directed by your caregiver. Experiencing uterine cramps is a good sign to stop exercising.  Continue to eat regular, healthy meals.  Wear a good support bra for breast tenderness.  Do not use hot tubs, steam rooms, or saunas.  Wear your seat belt at all times when driving.  Avoid raw meat, uncooked cheese, cat litter boxes, and soil used by cats. These carry germs that can cause birth defects in the baby.  Take your prenatal vitamins.  Try taking a stool softener (if your caregiver approves) if you develop constipation. Eat more high-fiber foods, such as fresh vegetables or fruit and whole grains. Drink plenty of fluids to keep your urine clear or pale yellow.  Take warm sitz baths to soothe any pain or discomfort caused by hemorrhoids. Use hemorrhoid cream if your caregiver approves.  If you develop varicose veins, wear support hose. Elevate your feet for 15 minutes, 3-4 times a day. Limit salt in your diet.  Avoid heavy lifting, wear low heal shoes, and practice good posture.  Rest a lot with your legs elevated if you have leg cramps or low back pain.  Visit your dentist if you have not gone during your pregnancy. Use a soft toothbrush to brush your teeth and be gentle when you floss.  A sexual relationship may be continued unless your caregiver directs you otherwise.  Do not travel far distances unless it is absolutely necessary and only with the approval  of your caregiver.  Take prenatal classes to understand, practice, and ask questions about the labor and delivery.  Make a trial run to the hospital.  Pack your hospital bag.  Prepare the baby's nursery.  Continue to go to all your prenatal visits as directed by your caregiver. SEEK MEDICAL CARE IF:  You are unsure if you are in labor or if your water has broken.  You have dizziness.  You have mild pelvic cramps, pelvic pressure, or nagging pain in your abdominal area.  You have persistent nausea, vomiting, or diarrhea.  You have a bad smelling vaginal discharge.  You have pain with urination. SEEK IMMEDIATE MEDICAL CARE IF:   You have a fever.  You are leaking fluid from your vagina.  You have spotting or bleeding from your vagina.  You have severe abdominal cramping or pain.  You have rapid weight loss or gain.  You have shortness of breath with chest pain.  You notice sudden or extreme swelling   of your face, hands, ankles, feet, or legs.  You have not felt your baby move in over an hour.  You have severe headaches that do not go away with medicine.  You have vision changes. Document Released: 01/20/2001 Document Revised: 01/31/2013 Document Reviewed: 03/29/2012 [employer] Patient Information 2015 [employer]. This information is not intended to replace advice given to you by your health care provider. Make sure you discuss any questions you have with your health care provider.  

## 2014-02-15 NOTE — Addendum Note (Signed)
Addended by: Candelaria StagersHAIZLIP, Alexiz Cothran E on: 02/15/2014 10:03 AM   Modules accepted: Orders, Level of Service

## 2014-02-15 NOTE — Progress Notes (Signed)
Having some cramps, no contractions.   Labor precautions given Did not get script for diflucan - resent to pharmacy.

## 2014-02-15 NOTE — Progress Notes (Signed)
1/4 NST reviewed and reactive 

## 2014-02-15 NOTE — Progress Notes (Signed)
NST reactive AFI 11

## 2014-02-19 ENCOUNTER — Encounter: Payer: Self-pay | Admitting: Obstetrics & Gynecology

## 2014-02-19 ENCOUNTER — Ambulatory Visit (INDEPENDENT_AMBULATORY_CARE_PROVIDER_SITE_OTHER): Payer: Medicaid Other | Admitting: General Practice

## 2014-02-19 ENCOUNTER — Ambulatory Visit (INDEPENDENT_AMBULATORY_CARE_PROVIDER_SITE_OTHER): Payer: Medicaid Other | Admitting: Infectious Disease

## 2014-02-19 VITALS — BP 113/78 | HR 93 | Temp 97.8°F | Resp 16 | Wt 244.5 lb

## 2014-02-19 VITALS — BP 127/78 | HR 106 | Wt 245.3 lb

## 2014-02-19 DIAGNOSIS — O210 Mild hyperemesis gravidarum: Secondary | ICD-10-CM

## 2014-02-19 DIAGNOSIS — O98713 Human immunodeficiency virus [HIV] disease complicating pregnancy, third trimester: Secondary | ICD-10-CM

## 2014-02-19 DIAGNOSIS — Z006 Encounter for examination for normal comparison and control in clinical research program: Secondary | ICD-10-CM

## 2014-02-19 DIAGNOSIS — O10913 Unspecified pre-existing hypertension complicating pregnancy, third trimester: Secondary | ICD-10-CM

## 2014-02-19 DIAGNOSIS — G4489 Other headache syndrome: Secondary | ICD-10-CM

## 2014-02-19 NOTE — Progress Notes (Signed)
NST reactive.

## 2014-02-19 NOTE — Progress Notes (Signed)
Subjective:    Patient ID: Catherine Simmons, female    DOB: [birth year], 29 y.o.   MRN: [id number]  HPI  Monzerrath comes in to clinic for her 2nd day of PK monitoring. She is doing well. Her main physical complaints relate to "morning sickness" that is controlled with the zofran, GERD that is tolerable and headaches that she is also able to manage. She was sleeping comfortably in the exam room when I arrived.  Review of Systems  Constitutional: Positive for fatigue. Negative for fever, chills, diaphoresis, activity change, appetite change and unexpected weight change.  HENT: Negative for congestion, rhinorrhea, sinus pressure, sneezing, sore throat and trouble swallowing.   Eyes: Negative for photophobia and visual disturbance.  Respiratory: Negative for cough, chest tightness, shortness of breath, wheezing and stridor.   Cardiovascular: Negative for chest pain, palpitations and leg swelling.  Gastrointestinal: Positive for nausea, abdominal pain and abdominal distention. Negative for vomiting, diarrhea, constipation, blood in stool and anal bleeding.  Endocrine: Negative for heat intolerance, polydipsia, polyphagia and polyuria.  Genitourinary: Negative for dysuria, frequency, hematuria, flank pain, decreased urine volume, vaginal bleeding, vaginal discharge, difficulty urinating, genital sores, vaginal pain and pelvic pain.  Musculoskeletal: Negative for myalgias, back pain, joint swelling, arthralgias, gait problem, neck pain and neck stiffness.  Skin: Negative for color change, pallor, rash and wound.  Neurological: Positive for headaches. Negative for dizziness, tremors, facial asymmetry, weakness and light-headedness.  Hematological: Negative for adenopathy. Does not bruise/bleed easily.  Psychiatric/Behavioral: Negative for behavioral problems, confusion, sleep disturbance, dysphoric mood, decreased concentration and agitation. The patient is not nervous/anxious and is not hyperactive.          Objective:   Physical Exam  Constitutional: She is oriented to person, place, and time. She appears well-developed and well-nourished. No distress.  HENT:  Head: Normocephalic and atraumatic.  Right Ear: Hearing and external ear normal.  Left Ear: Hearing and external ear normal.  Nose: Nose normal. Right sinus exhibits no maxillary sinus tenderness and no frontal sinus tenderness. Left sinus exhibits no maxillary sinus tenderness and no frontal sinus tenderness.  Mouth/Throat: No oropharyngeal exudate.  Eyes: Conjunctivae, EOM and lids are normal. Pupils are equal, round, and reactive to light. Right eye exhibits no chemosis, no discharge, no exudate and no hordeolum. Left eye exhibits no chemosis, no discharge, no exudate and no hordeolum. Right conjunctiva is not injected. Left conjunctiva is not injected. No scleral icterus.  Neck: Normal range of motion. Neck supple.  Cardiovascular: Normal rate and regular rhythm.  Exam reveals no gallop and no friction rub.   No murmur heard. Pulmonary/Chest: Effort normal and breath sounds normal. No respiratory distress. She has no wheezes.  Abdominal: Soft. Bowel sounds are normal. There is tenderness in the right upper quadrant. There is no rigidity, no rebound, no guarding, no CVA tenderness and negative Murphy's sign. Hernia confirmed negative in the right inguinal area and confirmed negative in the left inguinal area.    Genitourinary: Vagina normal.  Musculoskeletal: She exhibits no edema or tenderness.       Right shoulder: Normal.       Left shoulder: Normal.       Right elbow: Normal.      Left elbow: Normal.       Right wrist: Normal.       Right hip: Normal.       Left hip: Normal.       Right knee: Normal.       Left knee:  Normal.       Right ankle: Normal.       Left ankle: Normal.       Cervical back: Normal.       Right hand: Normal.  Lymphadenopathy:       Head (right side): No submental, no submandibular, no  posterior auricular and no occipital adenopathy present.       Head (left side): No submental, no submandibular, no posterior auricular and no occipital adenopathy present.    She has no cervical adenopathy.    She has no axillary adenopathy.       Right: No inguinal adenopathy present.       Left: No inguinal adenopathy present.  Neurological: She is alert and oriented to person, place, and time. She has normal strength. She displays no atrophy and no tremor. No cranial nerve deficit or sensory deficit. She exhibits normal muscle tone. She displays no seizure activity. Coordination and gait normal. GCS eye subscore is 4. GCS verbal subscore is 5. GCS motor subscore is 6.  Skin: Skin is warm and dry. No abrasion, no bruising, no ecchymosis, no laceration, no lesion, no petechiae and no rash noted. She is not diaphoretic. No cyanosis or erythema. No pallor. Nails show no clubbing.  Psychiatric: She has a normal mood and affect. Her behavior is normal. Judgment and thought content normal.          Assessment & Plan:   HIV:   HIV VL <20 when last checked as part of Epic, Per last migration of data to EPIc <20 on November 18th, 2015. I will look at paper records for December paperwork   Lab Results  Component Value Date   CD4TABS 651 12/27/2013   CD4TABS 701 12/09/2013   CD4TABS 600 10/12/2013   Continue Prezcobix and Truvada  She will complete her 2nd PK visit today. She tells me she is at 36 weeks and that Pecola LeisureBaby is doing well, Fetal US on 02/07/14 showed fetus in cephalic position and US measurements were normal  WHEN SHE GOES INTO LABOR SHE WILL PHONE KIM EPPERSON OUR LEAD RESEARCH RN SO THAT WE CAN ENSURE THAT CORD BLOOD IS PROPERLY COLLECTED INTO SPECIFIC TUBES THAT JANNSEN REQUIRES TO PERFORM PROPER PK ANALYSIS ON DRUG PRESENT IN CORD BLOOD  Headaches: appears to be managing these without more than oTC meds  Nausea: had been major issue early in pregnancy. Now completely  manageable with zofran.

## 2014-02-20 NOTE — Progress Notes (Signed)
Catherine Simmons was here today for her 24 hr PK visit for the SwartzJanssen study.

## 2014-02-21 ENCOUNTER — Ambulatory Visit (INDEPENDENT_AMBULATORY_CARE_PROVIDER_SITE_OTHER): Payer: Medicaid Other | Admitting: *Deleted

## 2014-02-21 DIAGNOSIS — Z006 Encounter for examination for normal comparison and control in clinical research program: Secondary | ICD-10-CM

## 2014-02-21 LAB — CBC WITH DIFFERENTIAL/PLATELET
BASOS ABS: 0 10*3/uL (ref 0.0–0.1)
Basophils Relative: 0 % (ref 0–1)
Eosinophils Absolute: 0.1 10*3/uL (ref 0.0–0.7)
Eosinophils Relative: 1 % (ref 0–5)
HCT: 27.3 % — ABNORMAL LOW (ref 36.0–46.0)
Hemoglobin: 8.8 g/dL — ABNORMAL LOW (ref 12.0–15.0)
LYMPHS PCT: 26 % (ref 12–46)
Lymphs Abs: 1.7 10*3/uL (ref 0.7–4.0)
MCH: 24 pg — AB (ref 26.0–34.0)
MCHC: 32.2 g/dL (ref 30.0–36.0)
MCV: 74.4 fL — AB (ref 78.0–100.0)
MONO ABS: 0.6 10*3/uL (ref 0.1–1.0)
MONOS PCT: 9 % (ref 3–12)
MPV: 9.5 fL (ref 8.6–12.4)
Neutro Abs: 4.2 10*3/uL (ref 1.7–7.7)
Neutrophils Relative %: 64 % (ref 43–77)
PLATELETS: 221 10*3/uL (ref 150–400)
RBC: 3.67 MIL/uL — AB (ref 3.87–5.11)
RDW: 13.9 % (ref 11.5–15.5)
WBC: 6.6 10*3/uL (ref 4.0–10.5)

## 2014-02-21 NOTE — Progress Notes (Signed)
Catherine AbbeyMarkita is here today to recheck her CBC. hgb was 8.7 2 days ago and hct was 27%. She does c/o fatigue . She states she is taking her iron tabs since I told her yesterday she had to start taking them. Her next visit will be the intrapartum visit for blood work.

## 2014-02-22 ENCOUNTER — Other Ambulatory Visit: Payer: Medicaid Other

## 2014-02-23 ENCOUNTER — Other Ambulatory Visit: Payer: Self-pay | Admitting: Internal Medicine

## 2014-02-26 ENCOUNTER — Ambulatory Visit (INDEPENDENT_AMBULATORY_CARE_PROVIDER_SITE_OTHER): Payer: Medicaid Other | Admitting: Obstetrics & Gynecology

## 2014-02-26 ENCOUNTER — Other Ambulatory Visit: Payer: Self-pay | Admitting: Obstetrics & Gynecology

## 2014-02-26 VITALS — BP 131/69 | HR 88 | Temp 98.2°F | Wt 242.3 lb

## 2014-02-26 DIAGNOSIS — Z3493 Encounter for supervision of normal pregnancy, unspecified, third trimester: Secondary | ICD-10-CM

## 2014-02-26 DIAGNOSIS — O0993 Supervision of high risk pregnancy, unspecified, third trimester: Secondary | ICD-10-CM

## 2014-02-26 DIAGNOSIS — O10913 Unspecified pre-existing hypertension complicating pregnancy, third trimester: Secondary | ICD-10-CM

## 2014-02-26 DIAGNOSIS — O98713 Human immunodeficiency virus [HIV] disease complicating pregnancy, third trimester: Secondary | ICD-10-CM

## 2014-02-26 LAB — OB RESULTS CONSOLE GC/CHLAMYDIA
Chlamydia: NEGATIVE
Gonorrhea: NEGATIVE

## 2014-02-26 LAB — US OB FOLLOW UP

## 2014-02-26 LAB — POCT URINALYSIS DIP (DEVICE)
Bilirubin Urine: NEGATIVE
Glucose, UA: NEGATIVE mg/dL
HGB URINE DIPSTICK: NEGATIVE
KETONES UR: 40 mg/dL — AB
Nitrite: NEGATIVE
Protein, ur: NEGATIVE mg/dL
SPECIFIC GRAVITY, URINE: 1.02 (ref 1.005–1.030)
UROBILINOGEN UA: 0.2 mg/dL (ref 0.0–1.0)
pH: 5.5 (ref 5.0–8.0)

## 2014-02-26 LAB — OB RESULTS CONSOLE GBS: STREP GROUP B AG: POSITIVE

## 2014-02-26 MED ORDER — BUTALBITAL-APAP-CAFFEINE 50-500-40 MG PO TABS: 1.0000 | ORAL_TABLET | ORAL | Status: DC | PRN

## 2014-02-26 NOTE — Progress Notes (Signed)
Cultures today 

## 2014-02-26 NOTE — Progress Notes (Signed)
NST performed today was reviewed and was found to be reactive.  Continue recommended antenatal testing and prenatal care. Pelvic cultures done today. No other complaints or concerns.  Labor and fetal movement precautions reviewed.

## 2014-02-26 NOTE — Patient Instructions (Signed)
Return to clinic for any obstetric concerns or go to [facility] for evaluation  

## 2014-02-27 LAB — GC/CHLAMYDIA PROBE AMP
CT PROBE, AMP APTIMA: NEGATIVE
GC Probe RNA: NEGATIVE

## 2014-02-28 ENCOUNTER — Inpatient Hospital Stay (HOSPITAL_COMMUNITY)
Admission: AD | Admit: 2014-02-28 | Discharge: 2014-02-28 | Disposition: A | Discharge status: Home / Self Care | Payer: Medicaid Other | Source: Ambulatory Visit | Attending: Obstetrics & Gynecology | Admitting: Obstetrics & Gynecology

## 2014-02-28 ENCOUNTER — Encounter (HOSPITAL_COMMUNITY): Payer: Self-pay

## 2014-02-28 DIAGNOSIS — Z87891 Personal history of nicotine dependence: Secondary | ICD-10-CM | POA: Diagnosis not present

## 2014-02-28 DIAGNOSIS — Z3A36 36 weeks gestation of pregnancy: Secondary | ICD-10-CM | POA: Insufficient documentation

## 2014-02-28 DIAGNOSIS — O4703 False labor before 37 completed weeks of gestation, third trimester: Secondary | ICD-10-CM

## 2014-02-28 LAB — URINALYSIS, ROUTINE W REFLEX MICROSCOPIC
BILIRUBIN URINE: NEGATIVE
GLUCOSE, UA: NEGATIVE mg/dL
KETONES UR: NEGATIVE mg/dL
Nitrite: NEGATIVE
Protein, ur: NEGATIVE mg/dL
Specific Gravity, Urine: 1.01 (ref 1.005–1.030)
Urobilinogen, UA: 0.2 mg/dL (ref 0.0–1.0)
pH: 5.5 (ref 5.0–8.0)

## 2014-02-28 LAB — URINE MICROSCOPIC-ADD ON

## 2014-02-28 LAB — CULTURE, BETA STREP (GROUP B ONLY)

## 2014-02-28 NOTE — Progress Notes (Signed)
Notified of pt arrival in MAU, complaint, and cervical exam. Will come see pt

## 2014-02-28 NOTE — MAU Provider Note (Signed)
History     CSN: [id number]  Arrival date and time: 02/28/14 1032   First Provider Initiated Contact with Patient 02/28/14 1128      Chief Complaint  Patient presents with  . Labor Eval   HPI 29 yo G3P2002 at [redacted]w[redacted]d presents for labor evaluation. Contractions started at 3 am this morning, every 3-4 minutes. Was able to sleep during this time. Now every 6-9 minutes.   Denies LOF, blood, decreased fetal movement.  Past Medical History  Diagnosis Date  . Cholecystitis 07/16/10  . Hypertension   . HIV (human immunodeficiency virus infection)   . Genital warts 2004  . Sickle cell trait     Past Surgical History  Procedure Laterality Date  . Cholecystectomy    . Wisdom tooth extraction      Family History  Problem Relation Age of Onset  . Cancer Mother   . Diabetes Maternal Aunt     History  Substance Use Topics  . Smoking status: Former Smoker    Start date: 03/11/2012    Quit date: 08/24/2012  . Smokeless tobacco: Never Used  . Alcohol Use: No    Allergies:  Allergies  Allergen Reactions  . Stadol [Butorphanol Tartrate] Itching    Prescriptions prior to admission  Medication Sig Dispense Refill Last Dose  . acetaminophen-codeine (TYLENOL #3) 300-30 MG per tablet Take 1 tablet by mouth every 4 (four) hours as needed for moderate pain.   02/27/2014 at Unknown time  . darunavir-cobicistat (PREZCOBIX) 800-150 MG per tablet Take 1 tablet by mouth daily. Swallow whole. Do NOT crush, break or chew tablets. Take with food. 30 tablet 11 02/28/2014 at Unknown time  . emtricitabine-tenofovir (TRUVADA) 200-300 MG per tablet Take 1 tablet by mouth daily. 30 tablet 11 02/28/2014 at Unknown time  . ondansetron (ZOFRAN-ODT) 8 MG disintegrating tablet TAKE 1 TABLET BY MOUTH EVERY 8 HOURS AS NEEDED FOR NAUSEA AND VOMITING 30 tablet 0 Past Week at Unknown time  . polyethylene glycol powder (GLYCOLAX/MIRALAX) powder Take 17 g by mouth daily. 850 g 0 02/27/2014 at Unknown time  .  Prenatal Vit-Fe Fumarate-FA (PRENATAL MULTIVITAMIN) TABS tablet Take 1 tablet by mouth daily at 12 noon.   Past Week at Unknown time  . butalbital-acetaminophen-caffeine (ESGIC PLUS) 50-500-40 MG per tablet Take 1-2 tablets by mouth every 4 (four) hours as needed for pain or headache. 30 tablet 2 prn  . cyclobenzaprine (FLEXERIL) 10 MG tablet Take 1 tablet (10 mg total) by mouth at bedtime. (Patient not taking: Reported on 02/28/2014) 30 tablet 2 Taking  . fluconazole (DIFLUCAN) 150 MG tablet Take 1 tablet (150 mg total) by mouth once. (Patient not taking: Reported on 02/26/2014) 1 tablet 0 Not Taking  . pantoprazole (PROTONIX) 40 MG tablet Take 1 tablet (40 mg total) by mouth daily. (Patient not taking: Reported on 02/15/2014) 30 tablet 2 Not Taking  . zolpidem (AMBIEN CR) 6.25 MG CR tablet Take 1 tablet (6.25 mg total) by mouth at bedtime as needed for sleep. (Patient not taking: Reported on 02/28/2014) 10 tablet 1 Taking    Review of Systems  Constitutional: Negative for fever.  Cardiovascular: Negative for chest pain.  Gastrointestinal: Negative for nausea and vomiting.   Physical Exam   Blood pressure 136/88, pulse 119, temperature 98 F (36.7 C), temperature source Oral, resp. rate 18, last menstrual period 06/21/2013.  Physical Exam  Constitutional: She is oriented to person, place, and time. She appears well-developed and well-nourished.  HENT:  Head: Normocephalic and atraumatic.  Cardiovascular: Normal rate, regular rhythm and normal heart sounds.   Respiratory: Effort normal and breath sounds normal.  Neurological: She is alert and oriented to person, place, and time.    MAU Course  Procedures  MDM NST reassuring  Assessment and Plan  29 yo female G3P2002 at 3240w0d presents for labor eval.  # contractions without cervical change - does not appear to be in active labor  # dispo- discharge home. Follow up with regular OB/GYN appt  Rolm BookbinderMoss, Amber 02/28/2014, 11:28 AM   OB  fellow attestation:  I have seen and examined this patient; I agree with above documentation in the resident's note.   Catherine Simmons is a 29 y.o. G3P2002 reporting contractions, preterm labor +FM, denies LOF, VB, vaginal discharge.  PE: BP 130/87 mmHg  Pulse 88  Temp(Src) 98 F (36.7 C) (Oral)  Resp 18  LMP 06/21/2013 Gen: calm comfortable, NAD Resp: normal effort, no distress Abd: gravid  ROS, labs, PMH reviewed NST reactive  Dilation: Fingertip Effacement (%): Thick Cervical Position: Posterior Station: Ballotable Exam by:: Sharen Hintaroline Brewer RN   Plan: - fetal kick counts reinforced, labor precautions  Perry MountACOSTA,Lanyia Jewel ROCIO, MD 3:24 PM

## 2014-02-28 NOTE — MAU Note (Signed)
Pt presents complaining of contractions every 3-4 minutes since 3am. Denies vaginal bleeding, leaking, or discharge. Reports good fetal movement.

## 2014-03-01 ENCOUNTER — Encounter: Payer: Self-pay | Admitting: Obstetrics & Gynecology

## 2014-03-01 ENCOUNTER — Ambulatory Visit (INDEPENDENT_AMBULATORY_CARE_PROVIDER_SITE_OTHER): Payer: Medicaid Other | Admitting: General Practice

## 2014-03-01 VITALS — Wt 246.9 lb

## 2014-03-01 DIAGNOSIS — O10913 Unspecified pre-existing hypertension complicating pregnancy, third trimester: Secondary | ICD-10-CM

## 2014-03-01 DIAGNOSIS — O99820 Streptococcus B carrier state complicating pregnancy: Secondary | ICD-10-CM | POA: Insufficient documentation

## 2014-03-01 LAB — POCT URINALYSIS DIP (DEVICE)
Bilirubin Urine: NEGATIVE
Glucose, UA: NEGATIVE mg/dL
HGB URINE DIPSTICK: NEGATIVE
KETONES UR: NEGATIVE mg/dL
Nitrite: NEGATIVE
Protein, ur: NEGATIVE mg/dL
Specific Gravity, Urine: 1.015 (ref 1.005–1.030)
Urobilinogen, UA: 1 mg/dL (ref 0.0–1.0)
pH: 6.5 (ref 5.0–8.0)

## 2014-03-05 ENCOUNTER — Ambulatory Visit (INDEPENDENT_AMBULATORY_CARE_PROVIDER_SITE_OTHER): Payer: Medicaid Other | Admitting: *Deleted

## 2014-03-05 VITALS — BP 150/82 | HR 110

## 2014-03-05 DIAGNOSIS — O10913 Unspecified pre-existing hypertension complicating pregnancy, third trimester: Secondary | ICD-10-CM

## 2014-03-05 LAB — COMPREHENSIVE METABOLIC PANEL
ALBUMIN: 2.9 g/dL — AB (ref 3.5–5.2)
ALT: <8 U/L (ref 0–35)
AST: 8 U/L (ref 0–37)
Alkaline Phosphatase: 145 U/L — ABNORMAL HIGH (ref 39–117)
BUN: 4 mg/dL — ABNORMAL LOW (ref 6–23)
CO2: 21 mEq/L (ref 19–32)
Calcium: 8.5 mg/dL (ref 8.4–10.5)
Chloride: 105 mEq/L (ref 96–112)
Creat: 0.65 mg/dL (ref 0.50–1.10)
Glucose, Bld: 110 mg/dL — ABNORMAL HIGH (ref 70–99)
POTASSIUM: 3.8 meq/L (ref 3.5–5.3)
Sodium: 135 mEq/L (ref 135–145)
Total Bilirubin: 0.2 mg/dL (ref 0.2–1.2)
Total Protein: 5.9 g/dL — ABNORMAL LOW (ref 6.0–8.3)

## 2014-03-05 LAB — CBC
HEMATOCRIT: 27.2 % — AB (ref 36.0–46.0)
Hemoglobin: 8.8 g/dL — ABNORMAL LOW (ref 12.0–15.0)
MCH: 23.6 pg — ABNORMAL LOW (ref 26.0–34.0)
MCHC: 32.4 g/dL (ref 30.0–36.0)
MCV: 72.9 fL — ABNORMAL LOW (ref 78.0–100.0)
MPV: 9.5 fL (ref 8.6–12.4)
PLATELETS: 238 10*3/uL (ref 150–400)
RBC: 3.73 MIL/uL — AB (ref 3.87–5.11)
RDW: 14.3 % (ref 11.5–15.5)
WBC: 5.7 10*3/uL (ref 4.0–10.5)

## 2014-03-05 LAB — US OB FOLLOW UP

## 2014-03-05 MED ORDER — FLUCONAZOLE 150 MG PO TABS: ORAL_TABLET | ORAL | Status: DC

## 2014-03-05 NOTE — Progress Notes (Signed)
Reactive NST today 

## 2014-03-05 NOTE — Progress Notes (Signed)
Pt reports vaginal itching and small amount of discharge - "Just like when I had a yeast infection 1 month ago."  Rx for diflucan per Sharen CounterLisa Leftwich-Kirby.  Pt denies having a headache or visual disturbances today. PIH labs wnl on 12/23, will repeat today. Ob fu and fetal testing on 1/28.

## 2014-03-06 LAB — PROTEIN / CREATININE RATIO, URINE
Creatinine, Urine: 55 mg/dL
Protein Creatinine Ratio: 0.22 — ABNORMAL HIGH (ref <0.15)
Total Protein, Urine: 12 mg/dL (ref 5–24)

## 2014-03-07 ENCOUNTER — Encounter: Payer: Self-pay | Admitting: Internal Medicine

## 2014-03-07 LAB — CD4/CD8 (T-HELPER/T-SUPPRESSOR CELL)
CD4%: 39.4
CD4: 776
CD8 % Suppressor T Cell: 29.8
CD8: 587

## 2014-03-07 LAB — HIV-1 RNA QUANT-NO REFLEX-BLD: HIV-1 RNA Viral Load: <20

## 2014-03-07 LAB — HEMOGLOBIN: HGB: 8.7 g/dL

## 2014-03-07 LAB — HEMATOCRIT, BODY FLUID: HEMATOCRIT: 27 %

## 2014-03-08 ENCOUNTER — Ambulatory Visit (INDEPENDENT_AMBULATORY_CARE_PROVIDER_SITE_OTHER): Payer: Medicaid Other | Admitting: Obstetrics & Gynecology

## 2014-03-08 ENCOUNTER — Other Ambulatory Visit: Payer: Self-pay | Admitting: Internal Medicine

## 2014-03-08 ENCOUNTER — Ambulatory Visit (HOSPITAL_COMMUNITY): Payer: Medicaid Other

## 2014-03-08 VITALS — BP 135/88 | HR 104 | Temp 97.6°F | Wt 247.8 lb

## 2014-03-08 DIAGNOSIS — O289 Unspecified abnormal findings on antenatal screening of mother: Secondary | ICD-10-CM

## 2014-03-08 DIAGNOSIS — O0993 Supervision of high risk pregnancy, unspecified, third trimester: Secondary | ICD-10-CM

## 2014-03-08 DIAGNOSIS — O10913 Unspecified pre-existing hypertension complicating pregnancy, third trimester: Secondary | ICD-10-CM

## 2014-03-08 DIAGNOSIS — O288 Other abnormal findings on antenatal screening of mother: Secondary | ICD-10-CM

## 2014-03-08 LAB — POCT URINALYSIS DIP (DEVICE)
Bilirubin Urine: NEGATIVE
GLUCOSE, UA: NEGATIVE mg/dL
HGB URINE DIPSTICK: NEGATIVE
KETONES UR: NEGATIVE mg/dL
Leukocytes, UA: NEGATIVE
NITRITE: NEGATIVE
PH: 5.5 (ref 5.0–8.0)
Protein, ur: NEGATIVE mg/dL
SPECIFIC GRAVITY, URINE: 1.01 (ref 1.005–1.030)
Urobilinogen, UA: 0.2 mg/dL (ref 0.0–1.0)

## 2014-03-08 NOTE — Progress Notes (Signed)
NST reactive. Labor precautions given 

## 2014-03-08 NOTE — Progress Notes (Signed)
States couldn't sleep last night due to contractions, took [place] about 2am.

## 2014-03-08 NOTE — Progress Notes (Signed)
Pt states contraction every 3 mins since 1030 last pm, recently changed to every 7 min.

## 2014-03-08 NOTE — Patient Instructions (Signed)

## 2014-03-12 ENCOUNTER — Encounter: Payer: Self-pay | Admitting: Obstetrics & Gynecology

## 2014-03-12 ENCOUNTER — Ambulatory Visit (INDEPENDENT_AMBULATORY_CARE_PROVIDER_SITE_OTHER): Payer: Medicaid Other | Admitting: *Deleted

## 2014-03-12 ENCOUNTER — Ambulatory Visit (HOSPITAL_COMMUNITY)
Admission: RE | Admit: 2014-03-12 | Discharge: 2014-03-12 | Disposition: A | Discharge status: Home / Self Care | Payer: Medicaid Other | Source: Ambulatory Visit | Attending: Obstetrics & Gynecology | Admitting: Obstetrics & Gynecology

## 2014-03-12 VITALS — BP 135/80 | HR 112

## 2014-03-12 DIAGNOSIS — O10013 Pre-existing essential hypertension complicating pregnancy, third trimester: Secondary | ICD-10-CM | POA: Insufficient documentation

## 2014-03-12 DIAGNOSIS — O99213 Obesity complicating pregnancy, third trimester: Secondary | ICD-10-CM | POA: Insufficient documentation

## 2014-03-12 DIAGNOSIS — O98713 Human immunodeficiency virus [HIV] disease complicating pregnancy, third trimester: Secondary | ICD-10-CM | POA: Diagnosis not present

## 2014-03-12 DIAGNOSIS — Z21 Asymptomatic human immunodeficiency virus [HIV] infection status: Secondary | ICD-10-CM | POA: Insufficient documentation

## 2014-03-12 DIAGNOSIS — O10913 Unspecified pre-existing hypertension complicating pregnancy, third trimester: Secondary | ICD-10-CM

## 2014-03-12 DIAGNOSIS — Z3A37 37 weeks gestation of pregnancy: Secondary | ICD-10-CM | POA: Insufficient documentation

## 2014-03-12 IMAGING — US US OB FOLLOW-UP
1 series · 12 of 28 positions shown · non-contrast
Comparison: none

[Series 1: us ob follow-up · 0.22mm/px · 56 acquisitions, 12 frames shown]
[im 3/56]
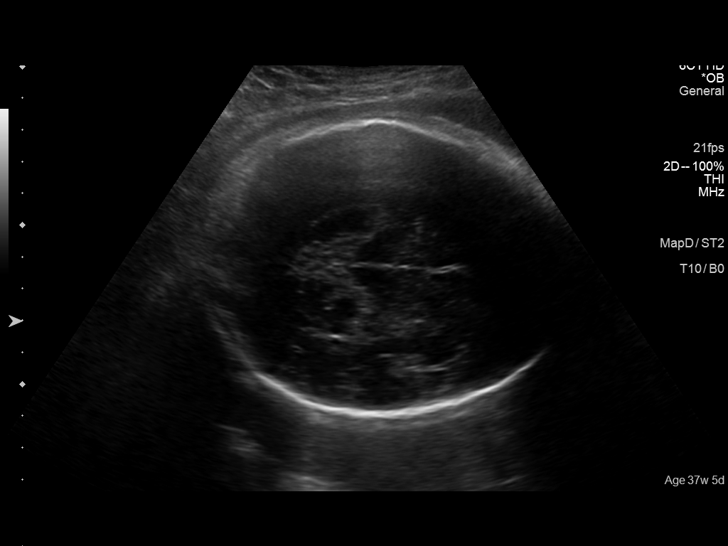
[im 7/56]
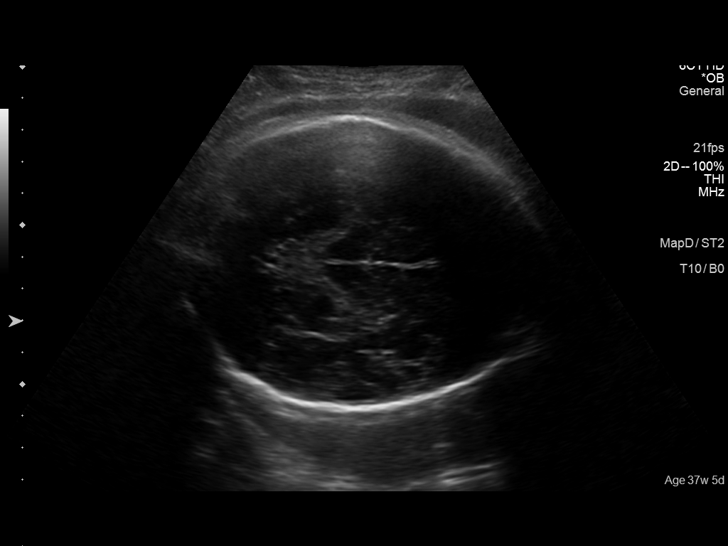
[im 11/56]
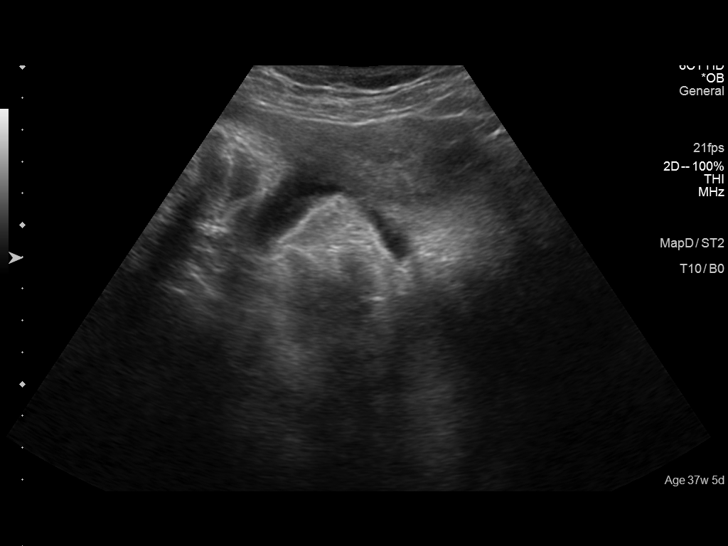
[im 17/56]
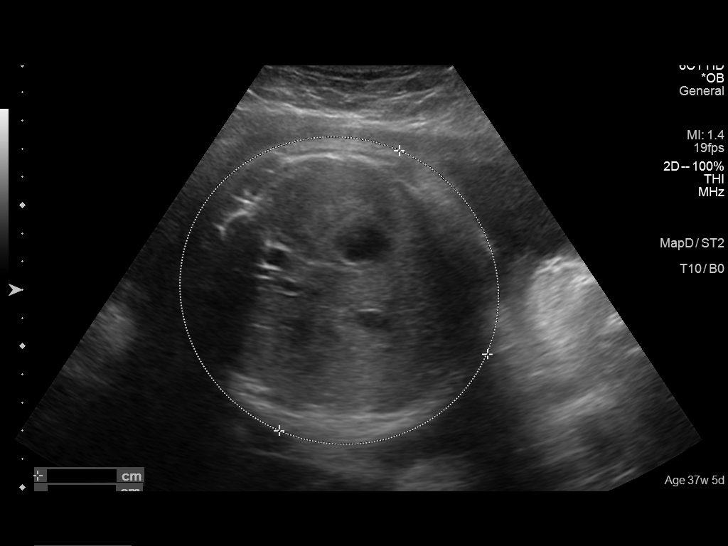
[im 21/56]
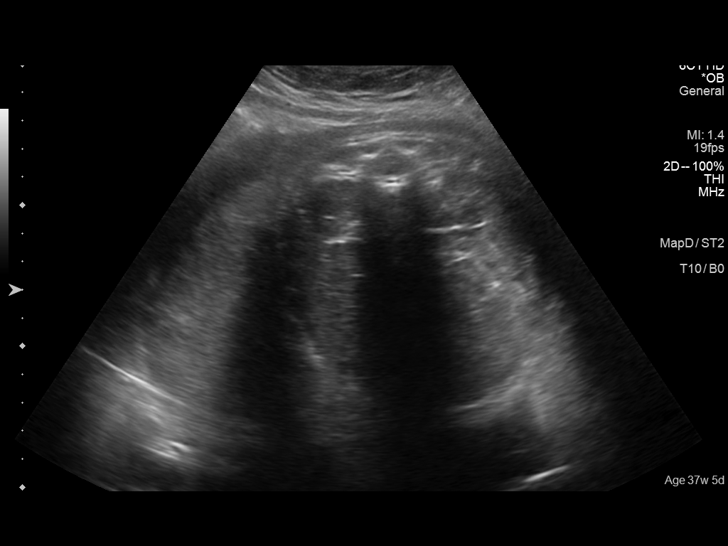
[im 25/56]
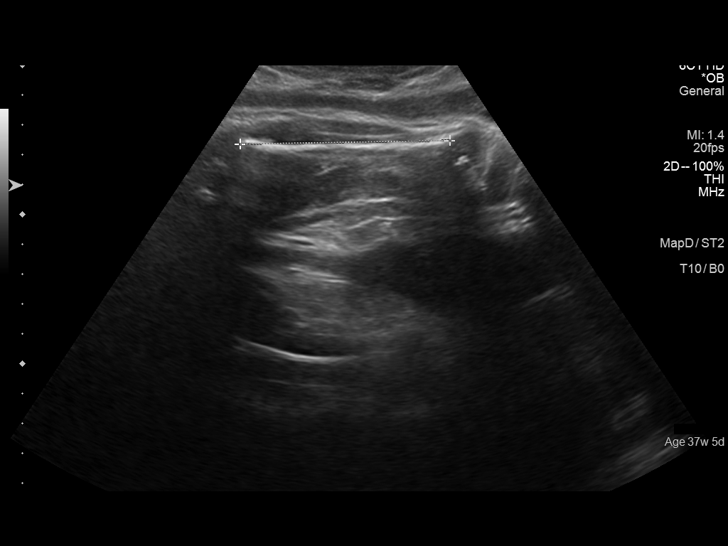
[im 31/56]
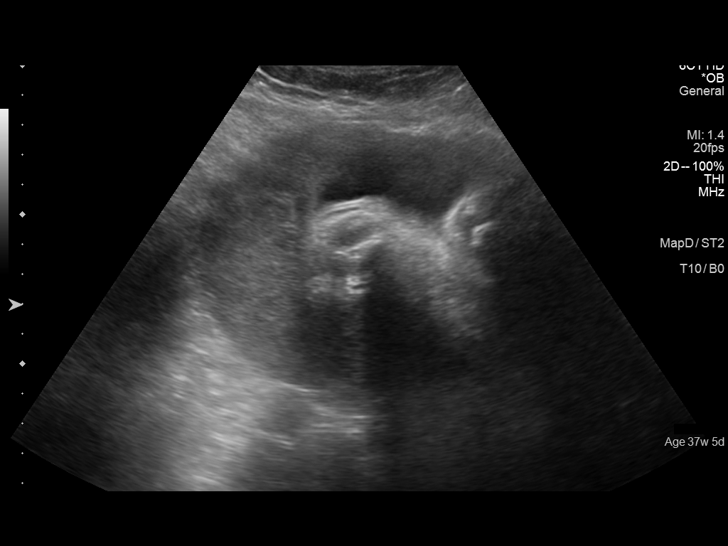
[im 35/56]
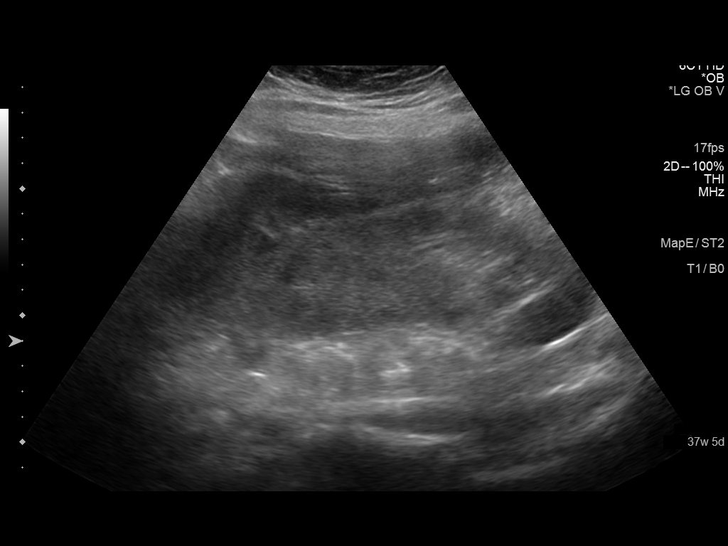
[im 39/56]
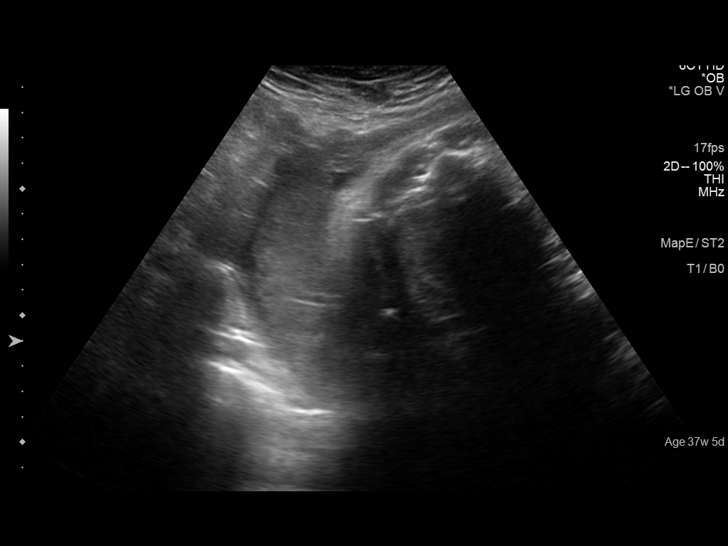
[im 45/56]
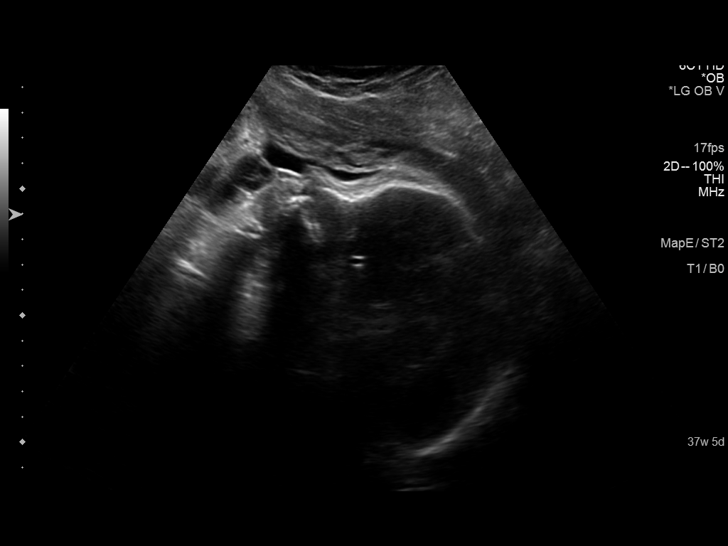
[im 49/56]
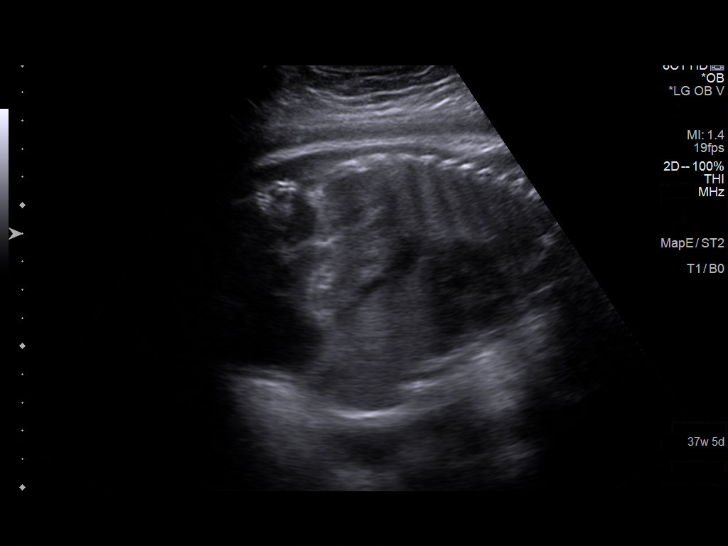
[im 53/56]
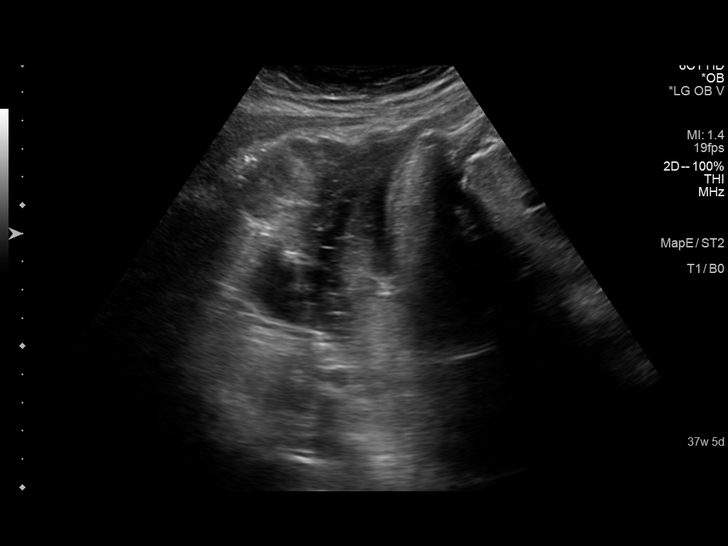

[12 of 28 positions shown; findings below may reference images not displayed]

OBSTETRICS REPORT
                      (Signed Final [DATE] [DATE])

Service(s) Provided

 US OB FOLLOW UP                                       76816.1
Indications

 37 weeks gestation of pregnancy
 Hypertension - Chronic/Pre-existing                   [1L]
 Maternal morbid obesity                               [1L] [1L]
 HIV: Asymptomatic                                     Z21
Fetal Evaluation

 Num Of Fetuses:    1
 Fetal Heart Rate:  152                          bpm
 Cardiac Activity:  Observed
 Presentation:      Cephalic
 Placenta:          Posterior, above cervical
                    os
 P. Cord            Previously Visualized
 Insertion:

 Amniotic Fluid
 AFI FV:      Subjectively within normal limits
 AFI Sum:     11.74   cm       38  %Tile     Larg Pckt:    5.98  cm
 RUQ:   0       cm   RLQ:    2      cm    LUQ:   5.98    cm   LLQ:    3.76   cm
Biometry

 BPD:     91.6  mm     G. Age:  37w 1d                CI:        74.33   70 - 86
                                                      FL/HC:      21.0   20.9 -

 HC:     337.3  mm     G. Age:  38w 5d       52   %   HC/AC:      0.96   0.92 -

 AC:     350.3  mm     G. Age:  39w 0d       91   %   FL/BPD:     77.5   71 - 87
 FL:        71  mm     G. Age:  36w 3d       21   %   FL/AC:      20.3   20 - 24
 HUM:     64.4  mm     G. Age:  37w 2d       69   %

 Est. FW:    [1L]   gm     7 lb 8 oz     82  %
Gestational Age

 LMP:           37w 5d        Date:  [DATE]                 EDD:   [DATE]
 U/S Today:     37w 6d                                        EDD:   [DATE]
 Best:          37w 5d     Det. By:  LMP  ([DATE])          EDD:   [DATE]
Anatomy

 Cranium:          Appears normal         Aortic Arch:      Previously seen
 Fetal Cavum:      Previously seen        Ductal Arch:      Previously seen
 Ventricles:       Appears normal         Diaphragm:        Appears normal
 Choroid Plexus:   Previously seen        Stomach:          Appears normal, left
                                                            sided
 Cerebellum:       Previously seen        Abdomen:          Previously seen
 Posterior Fossa:  Previously seen        Abdominal Wall:   Previously seen
 Nuchal Fold:      Previously seen        Cord Vessels:     Previously seen
 Face:             Orbits and profile     Kidneys:          Appear normal
                   previously seen
 Lips:             Previously seen        Bladder:          Appears normal
 Heart:            Previously seen        Spine:            Previously seen
 RVOT:             Previously seen        Lower             Previously seen
                                          Extremities:
 LVOT:             Previously seen        Upper             Previously seen
                                          Extremities:

 Other:  Fetus appears to be a male. Heels and 5th digit previously visualized.
Cervix Uterus Adnexa

 Cervix:       Normal appearance by transabdominal scan.
 Uterus:       No abnormality visualized.
 Cul De Sac:   No free fluid seen.
 Left Ovary:    No adnexal mass visualized.
 Right Ovary:   No adnexal mass visualized.

 Adnexa:     No adnexal mass visualized.
Impression

 SIUP at [1L]
 Normal interval anatomy;
 Normal amniotic fluid volume
 Appropriate interval growth with EFW at the 82nd %tile
Recommendations

 Continue antenatal testing until delivery (recommended 38-39
 weeks).

## 2014-03-12 NOTE — Progress Notes (Signed)
US for growth done today.  IOL scheduled 2/10 @ 0700.   Pt is very frustrated, tearful and upset- wants to deliver now. She is having trouble sleeping, pelvic pain, pressure and UC's.  Pt states the sleep medicine only works for about 2 hours. I asked her to bring med to appt on 2/4 so that dosage can be verified- may be able to adjust. Support and encouragement provided.

## 2014-03-13 NOTE — Progress Notes (Signed)
03/12/2014 NST reviewed and reactive

## 2014-03-15 ENCOUNTER — Ambulatory Visit (INDEPENDENT_AMBULATORY_CARE_PROVIDER_SITE_OTHER): Payer: Medicaid Other | Admitting: Obstetrics & Gynecology

## 2014-03-15 VITALS — BP 129/79 | HR 107 | Wt 248.9 lb

## 2014-03-15 DIAGNOSIS — Z22330 Carrier of Group B streptococcus: Secondary | ICD-10-CM

## 2014-03-15 DIAGNOSIS — O99820 Streptococcus B carrier state complicating pregnancy: Secondary | ICD-10-CM

## 2014-03-15 DIAGNOSIS — O98713 Human immunodeficiency virus [HIV] disease complicating pregnancy, third trimester: Secondary | ICD-10-CM

## 2014-03-15 DIAGNOSIS — O10913 Unspecified pre-existing hypertension complicating pregnancy, third trimester: Secondary | ICD-10-CM

## 2014-03-15 LAB — POCT URINALYSIS DIP (DEVICE)
Bilirubin Urine: NEGATIVE
GLUCOSE, UA: NEGATIVE mg/dL
HGB URINE DIPSTICK: NEGATIVE
Ketones, ur: NEGATIVE mg/dL
Leukocytes, UA: NEGATIVE
Nitrite: NEGATIVE
Protein, ur: NEGATIVE mg/dL
Specific Gravity, Urine: 1.01 (ref 1.005–1.030)
UROBILINOGEN UA: 0.2 mg/dL (ref 0.0–1.0)
pH: 6 (ref 5.0–8.0)

## 2014-03-15 NOTE — Progress Notes (Signed)
NST performed today was reviewed and was found to be reactive.  Continue recommended antenatal testing and prenatal care. Stable BP.  No other complaints or concerns.  Labor and fetal movement precautions reviewed.

## 2014-03-15 NOTE — Progress Notes (Signed)
NST/OBF Pt desires cervical exam due to contractions

## 2014-03-15 NOTE — Patient Instructions (Signed)
Return to clinic for any obstetric concerns or go to [facility] for evaluation  

## 2014-03-16 ENCOUNTER — Inpatient Hospital Stay (HOSPITAL_COMMUNITY)
Admission: AD | Admit: 2014-03-16 | Discharge: 2014-03-16 | Disposition: A | Discharge status: Home / Self Care | Payer: Medicaid Other | Source: Ambulatory Visit | Attending: Obstetrics & Gynecology | Admitting: Obstetrics & Gynecology

## 2014-03-16 ENCOUNTER — Encounter (HOSPITAL_COMMUNITY): Payer: Self-pay | Admitting: *Deleted

## 2014-03-16 ENCOUNTER — Inpatient Hospital Stay (HOSPITAL_COMMUNITY): Payer: Medicaid Other

## 2014-03-16 DIAGNOSIS — Z21 Asymptomatic human immunodeficiency virus [HIV] infection status: Secondary | ICD-10-CM | POA: Insufficient documentation

## 2014-03-16 DIAGNOSIS — O99820 Streptococcus B carrier state complicating pregnancy: Secondary | ICD-10-CM | POA: Insufficient documentation

## 2014-03-16 DIAGNOSIS — R0602 Shortness of breath: Secondary | ICD-10-CM

## 2014-03-16 DIAGNOSIS — O133 Gestational [pregnancy-induced] hypertension without significant proteinuria, third trimester: Secondary | ICD-10-CM | POA: Diagnosis not present

## 2014-03-16 DIAGNOSIS — O98713 Human immunodeficiency virus [HIV] disease complicating pregnancy, third trimester: Secondary | ICD-10-CM | POA: Insufficient documentation

## 2014-03-16 DIAGNOSIS — Z87891 Personal history of nicotine dependence: Secondary | ICD-10-CM | POA: Diagnosis not present

## 2014-03-16 DIAGNOSIS — R1030 Lower abdominal pain, unspecified: Secondary | ICD-10-CM | POA: Diagnosis present

## 2014-03-16 DIAGNOSIS — O9989 Other specified diseases and conditions complicating pregnancy, childbirth and the puerperium: Secondary | ICD-10-CM | POA: Insufficient documentation

## 2014-03-16 DIAGNOSIS — Z3A38 38 weeks gestation of pregnancy: Secondary | ICD-10-CM | POA: Insufficient documentation

## 2014-03-16 DIAGNOSIS — O471 False labor at or after 37 completed weeks of gestation: Secondary | ICD-10-CM

## 2014-03-16 DIAGNOSIS — R000 Tachycardia, unspecified: Secondary | ICD-10-CM

## 2014-03-16 IMAGING — DX DG CHEST 2V
3 series · 3 of 3 positions shown · non-contrast
Comparison: [DATE]

CLINICAL DATA: Shortness of breath, congestion, back pain for 1 day

EXAM:
CHEST  2 VIEW

[chest pa]
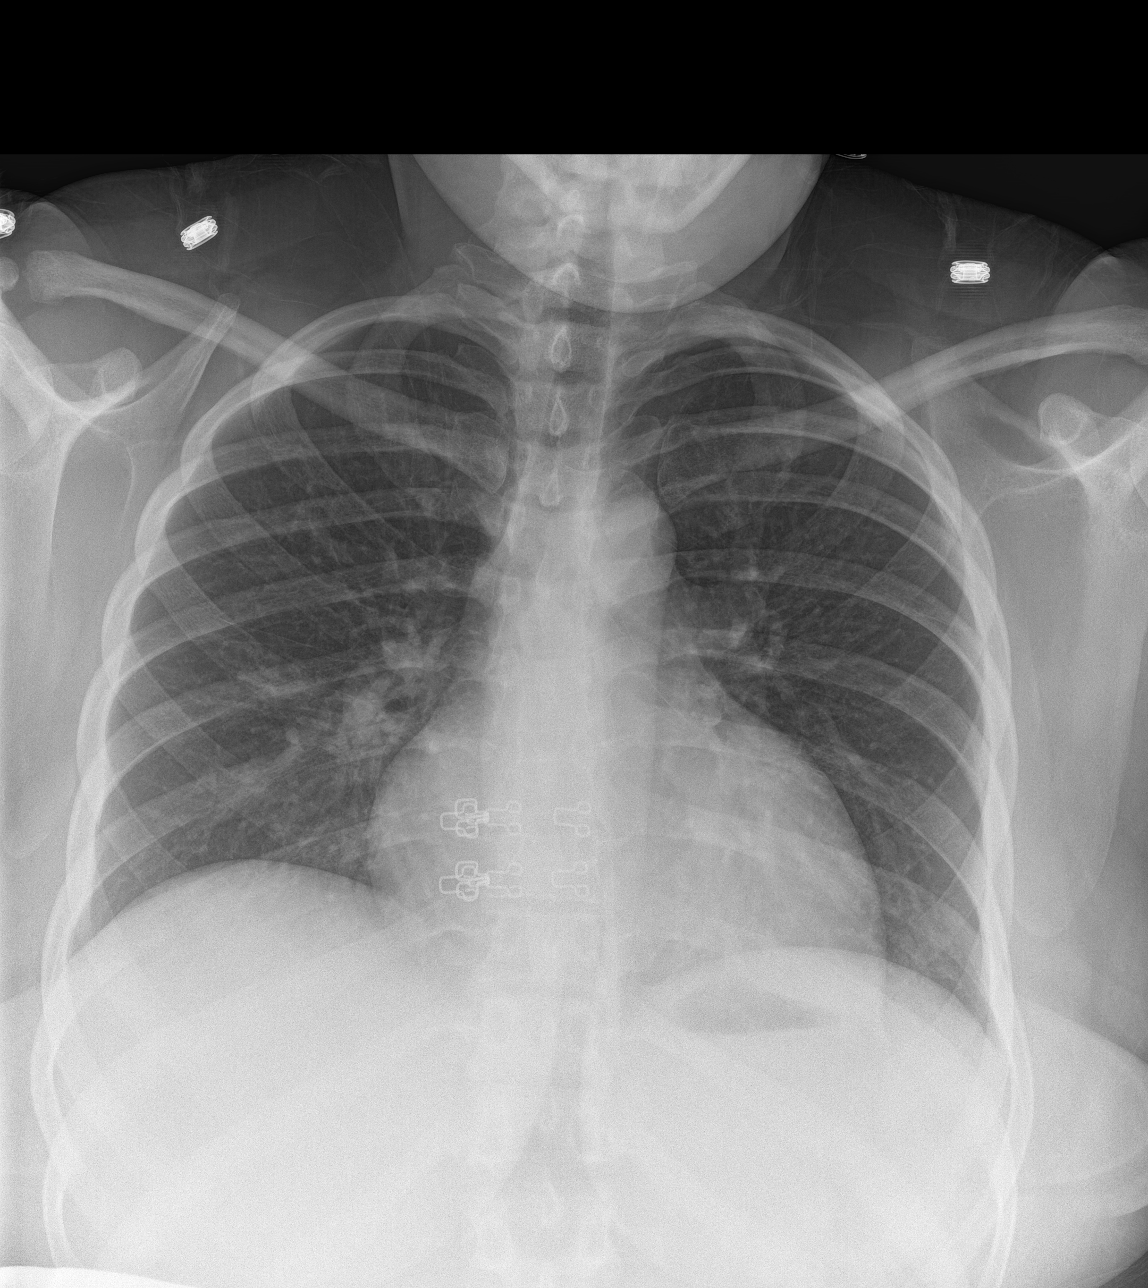

[chest lat]
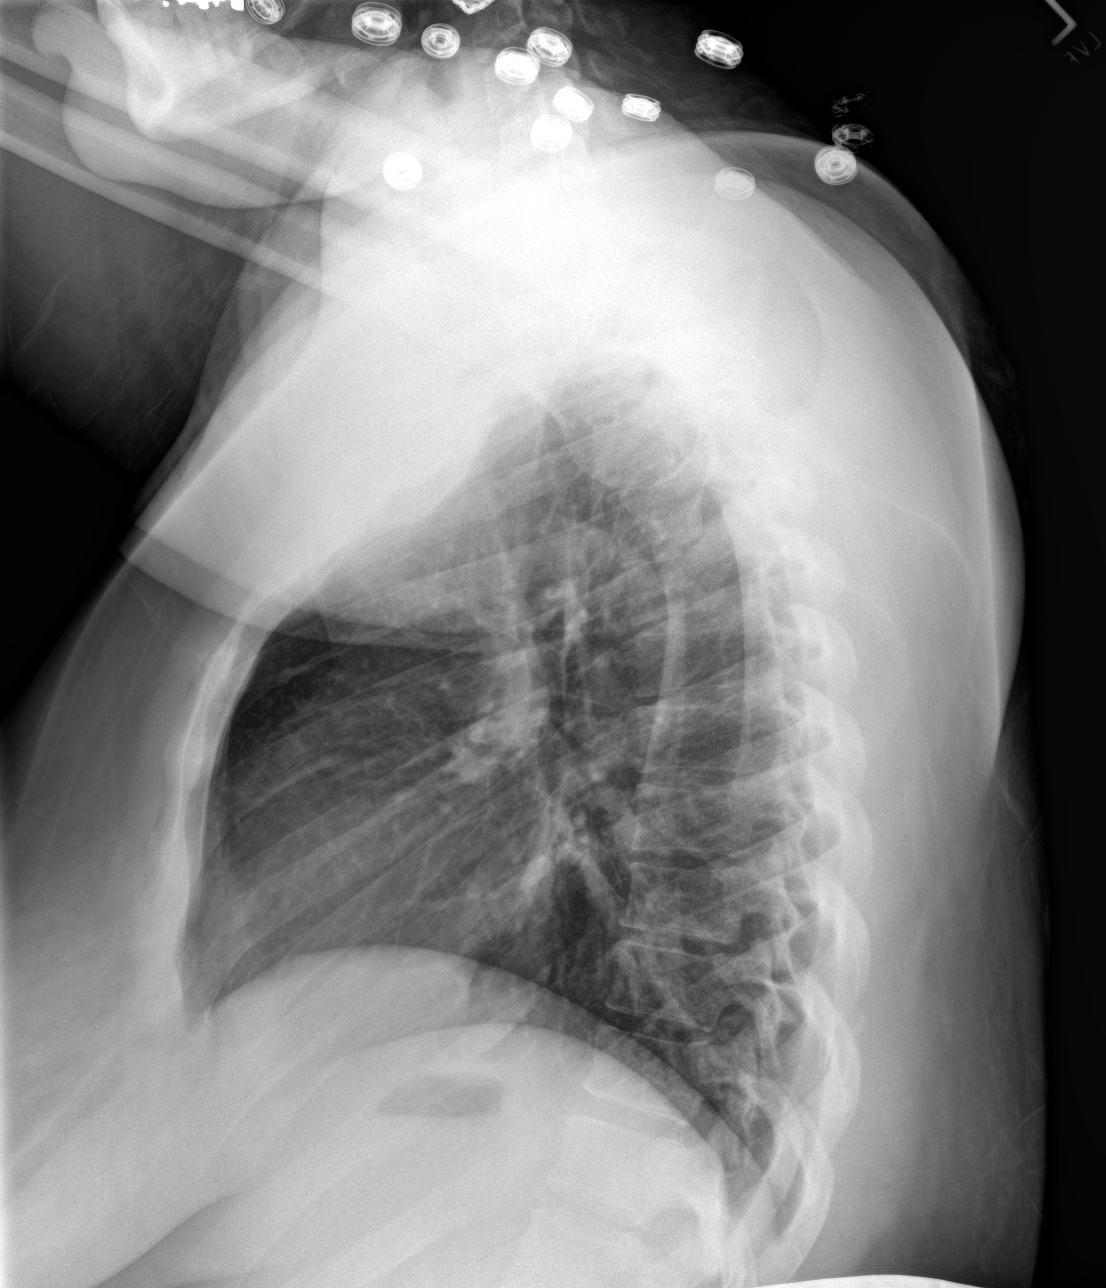

[chest ap]
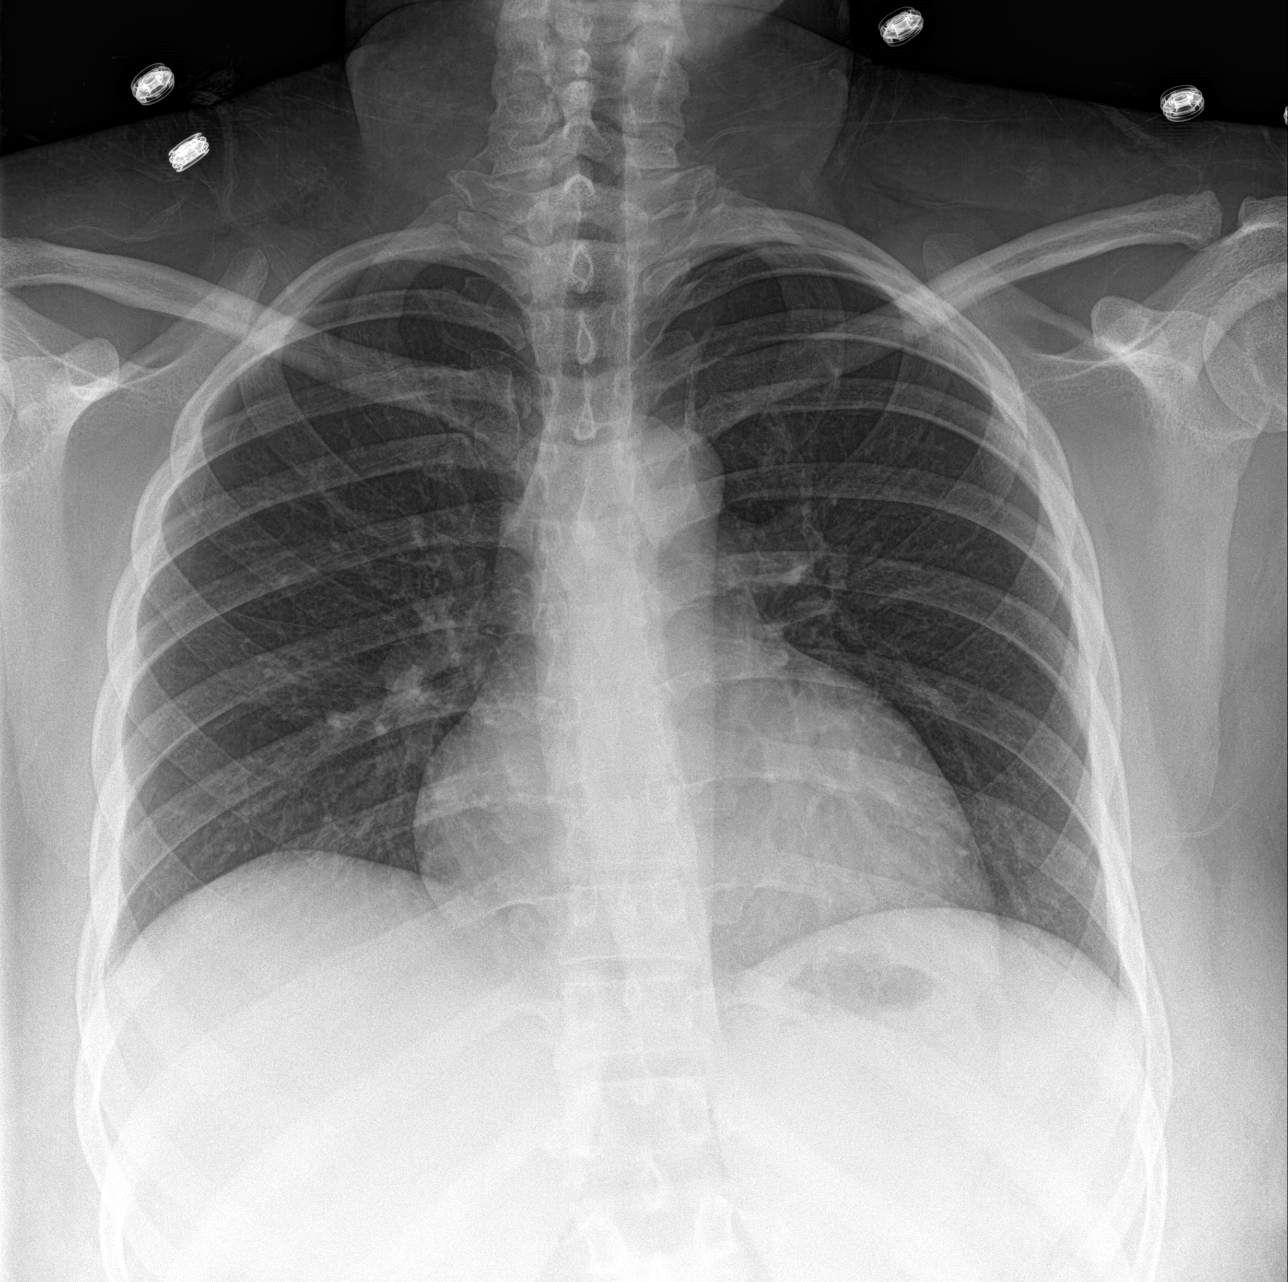

[3 of 3 positions shown; findings below may reference images not displayed]

FINDINGS: The heart size and mediastinal contours are within normal limits.
Both lungs are clear. The visualized skeletal structures are
unremarkable.
IMPRESSION: No active cardiopulmonary disease.

## 2014-03-16 MED ORDER — ACETAMINOPHEN 325 MG PO TABS
650.0000 mg | ORAL_TABLET | ORAL | Status: DC | PRN
  Administered 2014-03-16: 650 mg via ORAL
  Filled 2014-03-16: qty 2

## 2014-03-16 NOTE — MAU Provider Note (Signed)
History     CSN: [id number]  Arrival date and time: 03/16/14 0937   None     Chief Complaint  Patient presents with  . Contractions  . Shortness of Breath  . Chest Pain   HPI Patient is 29 y.o. [id number] 6783w2d presents for lower abdominal pain and low back pain since yesterday evening. Pregnancy complicated by HIV, GHTN, GBS pos. Pain occurs every 10 minutes, improved since this morning. Pain is causing some difficulty breathing. Nothing makes pain better. Walking makes pain worse. Denies chest pain. Feels like heart beating fast.  Denies LOF, bleeding, or decreased fetal movement.   Past Medical History  Diagnosis Date  . Cholecystitis 07/16/10  . Hypertension   . HIV (human immunodeficiency virus infection)   . Genital warts 2004  . Sickle cell trait     Past Surgical History  Procedure Laterality Date  . Cholecystectomy    . Wisdom tooth extraction      Family History  Problem Relation Age of Onset  . Cancer Mother   . Diabetes Maternal Aunt     History  Substance Use Topics  . Smoking status: Former Smoker    Start date: 03/11/2012    Quit date: 08/24/2012  . Smokeless tobacco: Never Used  . Alcohol Use: No    Allergies:  Allergies  Allergen Reactions  . Stadol [Butorphanol Tartrate] Itching    Prescriptions prior to admission  Medication Sig Dispense Refill Last Dose  . acetaminophen-codeine (TYLENOL #3) 300-30 MG per tablet Take 1 tablet by mouth every 4 (four) hours as needed for moderate pain.   Not Taking  . butalbital-acetaminophen-caffeine (ESGIC PLUS) 50-500-40 MG per tablet Take 1-2 tablets by mouth every 4 (four) hours as needed for pain or headache. (Patient not taking: Reported on 03/15/2014) 30 tablet 2 Not Taking  . darunavir-cobicistat (PREZCOBIX) 800-150 MG per tablet Take 1 tablet by mouth daily. Swallow whole. Do NOT crush, break or chew tablets. Take with food. 30 tablet 11 Taking  . emtricitabine-tenofovir (TRUVADA) 200-300 MG per tablet  Take 1 tablet by mouth daily. 30 tablet 11 Taking  . fluconazole (DIFLUCAN) 150 MG tablet Take 1 tablet by mouth.  Repeat in 3 days (Patient not taking: Reported on 03/08/2014) 2 tablet 0 Not Taking  . ondansetron (ZOFRAN-ODT) 8 MG disintegrating tablet DISSOLVE ONE TABLET BY MOUTH EVERY 8 HOURS AS NEEDED FOR NAUSEA OR VOMITING 30 tablet 0 Taking  . polyethylene glycol powder (GLYCOLAX/MIRALAX) powder Take 17 g by mouth daily. 850 g 0 Taking  . Prenatal Vit-Fe Fumarate-FA (PRENATAL MULTIVITAMIN) TABS tablet Take 1 tablet by mouth daily at 12 noon.   Taking  . zolpidem (AMBIEN) 5 MG tablet Take 5 mg by mouth at bedtime as needed for sleep.   Taking    ROS  Neg except for HPI  Physical Exam   Blood pressure 130/86, pulse 141, temperature 97.9 F (36.6 C), temperature source Oral, resp. rate 24, height 5\' 2"  (1.575 m), weight 110.678 kg (244 lb), last menstrual period 06/21/2013, SpO2 100 %.  Physical Exam  Constitutional: She is oriented to person, place, and time. She appears well-developed and well-nourished. No distress.  HENT:  Head: Normocephalic and atraumatic.  Cardiovascular: Normal rate, regular rhythm and normal heart sounds.   Respiratory: Effort normal. No respiratory distress. She has no wheezes. She has no rales.  GI: Soft.  Genitourinary:  Cervix: 2/30/-3  Musculoskeletal: Normal range of motion.  Neurological: She is alert and oriented to person, place, and  time.  Skin: Skin is warm and dry.  Psychiatric: She has a normal mood and affect. Her behavior is normal.    MAU Course  Procedures  MDM NST reassuring  Assessment and Plan  Patient is 29 y.o. [id number] [redacted]w[redacted]d reporting lower abdominal pain likely secondary to round ligament pain vs. Braxton hicks contractions. Concern for lung abnormality given tachycardia and SOB. spO2 at 100%, denies chest pain. CXR neg. - info given for round ligament pain and stretching - fetal kick counts reinforced - preterm labor  precautions  Rolm Bookbinder 03/16/2014, 10:40 AM   Patient seen and examined by me also Agree with note.  Aviva Signs, CNM

## 2014-03-16 NOTE — Discharge Instructions (Signed)
Round Ligament Pain During Pregnancy °Round ligament pain is a sharp pain or jabbing feeling often felt in the lower belly or groin area on one or both sides. It is one of the most common complaints during pregnancy and is considered a normal part of pregnancy. It is most often felt during the second trimester. ° °Here is what you need to know about round ligament pain, including some tips to help you feel better. ° °Causes of Round Ligament Pain ° °Several thick ligaments surround and support your womb (uterus) as it grows during pregnancy. One of them is called the round ligament. ° °The round ligament connects the front part of the womb to your groin, the area where your legs attach to your pelvis. The round ligament normally tightens and relaxes slowly. ° °As your baby and womb grow, the round ligament stretches. That makes it more likely to become strained. ° °Sudden movements can cause the ligament to tighten quickly, like a rubber band snapping. This causes a sudden and quick jabbing feeling. ° °Symptoms of Round Ligament Pain ° °Round ligament pain can be concerning and uncomfortable. But it is considered normal as your body changes during pregnancy. ° °The symptoms of round ligament pain include a sharp, sudden spasm in the belly. It usually affects the right side, but it may happen on both sides. The pain only lasts a few seconds. ° °Exercise may cause the pain, as will rapid movements such as: ° °sneezing °coughing °laughing °rolling over in bed °standing up too quickly ° °Treatment of Round Ligament Pain ° °Here are some tips that may help reduce your discomfort: ° °Pain relief. Take over-the-counter acetaminophen for pain, if necessary. Ask your doctor if this is OK. ° °Exercise. Get plenty of exercise to keep your stomach (core) muscles strong. Doing stretching exercises or prenatal yoga can be helpful. Ask your doctor which exercises are safe for you and your baby. ° °A helpful exercise involves  putting your hands and knees on the floor, lowering your head, and pushing your backside into the air. ° °Avoid sudden movements. Change positions slowly (such as standing up or sitting down) to avoid sudden movements that may cause stretching and pain. ° °Flex your hips. Bend and flex your hips before you cough, sneeze, or laugh to avoid pulling on the ligaments. ° °Apply warmth. A heating pad or warm bath may be helpful. Ask your doctor if this is OK. Extreme heat can be dangerous to the baby. ° °You should try to modify your daily activity level and avoid positions that may worsen the condition. ° °When to Call the Doctor/Midwife ° °Always tell your doctor or midwife about any type of pain you have during pregnancy. Round ligament pain is quick and doesn't last long. ° °Call your health care provider immediately if you have: ° °severe pain °fever °chills °pain on urination °difficulty walking ° °Belly pain during pregnancy can be due to many different causes. It is important for your doctor to rule out more serious conditions, including pregnancy complications such as placenta abruption or non-pregnancy illnesses such as: ° °inguinal hernia °appendicitis °stomach, liver, and kidney problems °Preterm labor pains may sometimes be mistaken for round ligament pain. ° °Low Back Stretches ° °Pregnancy stretches can help you feel your best, especially as your pregnancy progresses. ° °Exercises for the back, such as this low back stretch, can help ease backaches. ° °Rest on your hands and knees with your head in line with your back.   Pull in your stomach, rounding your back slightly. Hold for several seconds, and then relax your stomach and back -- keeping your back as flat as possible. Don't let your back sag. Gradually work up to 10 repetitions. ° ° ° °

## 2014-03-16 NOTE — MAU Note (Signed)
?   uc's since yesterday, having back pain.  SOB since 0700 this morning - having some chest pain along with the SOB.  Denies bleeding or LOF.

## 2014-03-19 ENCOUNTER — Encounter (HOSPITAL_COMMUNITY): Payer: Self-pay | Admitting: *Deleted

## 2014-03-19 ENCOUNTER — Inpatient Hospital Stay (HOSPITAL_COMMUNITY)
Admission: AD | Admit: 2014-03-19 | Discharge: 2014-03-22 | DRG: 774 | Disposition: A | Discharge status: Home / Self Care | Payer: Medicaid Other | Source: Ambulatory Visit | Attending: Family Medicine | Admitting: Family Medicine

## 2014-03-19 ENCOUNTER — Ambulatory Visit (INDEPENDENT_AMBULATORY_CARE_PROVIDER_SITE_OTHER): Payer: Medicaid Other | Admitting: *Deleted

## 2014-03-19 VITALS — BP 145/101 | HR 91

## 2014-03-19 DIAGNOSIS — O9872 Human immunodeficiency virus [HIV] disease complicating childbirth: Secondary | ICD-10-CM | POA: Diagnosis present

## 2014-03-19 DIAGNOSIS — O98713 Human immunodeficiency virus [HIV] disease complicating pregnancy, third trimester: Secondary | ICD-10-CM

## 2014-03-19 DIAGNOSIS — Z3A38 38 weeks gestation of pregnancy: Secondary | ICD-10-CM | POA: Diagnosis present

## 2014-03-19 DIAGNOSIS — O1092 Unspecified pre-existing hypertension complicating childbirth: Secondary | ICD-10-CM | POA: Diagnosis present

## 2014-03-19 DIAGNOSIS — O99344 Other mental disorders complicating childbirth: Secondary | ICD-10-CM | POA: Diagnosis present

## 2014-03-19 DIAGNOSIS — O99214 Obesity complicating childbirth: Secondary | ICD-10-CM | POA: Diagnosis present

## 2014-03-19 DIAGNOSIS — I10 Essential (primary) hypertension: Secondary | ICD-10-CM | POA: Diagnosis present

## 2014-03-19 DIAGNOSIS — Z885 Allergy status to narcotic agent status: Secondary | ICD-10-CM

## 2014-03-19 DIAGNOSIS — Z9049 Acquired absence of other specified parts of digestive tract: Secondary | ICD-10-CM | POA: Diagnosis present

## 2014-03-19 DIAGNOSIS — O10913 Unspecified pre-existing hypertension complicating pregnancy, third trimester: Secondary | ICD-10-CM | POA: Diagnosis present

## 2014-03-19 DIAGNOSIS — D573 Sickle-cell trait: Secondary | ICD-10-CM | POA: Diagnosis present

## 2014-03-19 DIAGNOSIS — Z87891 Personal history of nicotine dependence: Secondary | ICD-10-CM | POA: Diagnosis not present

## 2014-03-19 DIAGNOSIS — O113 Pre-existing hypertension with pre-eclampsia, third trimester: Secondary | ICD-10-CM | POA: Diagnosis present

## 2014-03-19 DIAGNOSIS — E669 Obesity, unspecified: Secondary | ICD-10-CM | POA: Diagnosis present

## 2014-03-19 DIAGNOSIS — O133 Gestational [pregnancy-induced] hypertension without significant proteinuria, third trimester: Secondary | ICD-10-CM | POA: Diagnosis not present

## 2014-03-19 DIAGNOSIS — O99013 Anemia complicating pregnancy, third trimester: Secondary | ICD-10-CM | POA: Diagnosis present

## 2014-03-19 DIAGNOSIS — F419 Anxiety disorder, unspecified: Secondary | ICD-10-CM | POA: Diagnosis present

## 2014-03-19 DIAGNOSIS — B20 Human immunodeficiency virus [HIV] disease: Secondary | ICD-10-CM | POA: Diagnosis present

## 2014-03-19 DIAGNOSIS — O99824 Streptococcus B carrier state complicating childbirth: Secondary | ICD-10-CM | POA: Diagnosis present

## 2014-03-19 DIAGNOSIS — Z79899 Other long term (current) drug therapy: Secondary | ICD-10-CM

## 2014-03-19 DIAGNOSIS — F329 Major depressive disorder, single episode, unspecified: Secondary | ICD-10-CM | POA: Diagnosis present

## 2014-03-19 DIAGNOSIS — Z6841 Body Mass Index (BMI) 40.0 and over, adult: Secondary | ICD-10-CM

## 2014-03-19 DIAGNOSIS — O99820 Streptococcus B carrier state complicating pregnancy: Secondary | ICD-10-CM

## 2014-03-19 LAB — COMPREHENSIVE METABOLIC PANEL
ALBUMIN: 2.6 g/dL — AB (ref 3.5–5.2)
ALK PHOS: 174 U/L — AB (ref 39–117)
ALT: 8 U/L (ref 0–35)
AST: 13 U/L (ref 0–37)
Anion gap: 4 — ABNORMAL LOW (ref 5–15)
BILIRUBIN TOTAL: 0.3 mg/dL (ref 0.3–1.2)
BUN: 7 mg/dL (ref 6–23)
CALCIUM: 8.3 mg/dL — AB (ref 8.4–10.5)
CHLORIDE: 109 mmol/L (ref 96–112)
CO2: 20 mmol/L (ref 19–32)
Creatinine, Ser: 0.57 mg/dL (ref 0.50–1.10)
GFR calc Af Amer: >90 mL/min (ref >90)
GFR calc non Af Amer: >90 mL/min (ref >90)
GLUCOSE: 80 mg/dL (ref 70–99)
Potassium: 4 mmol/L (ref 3.5–5.1)
Sodium: 133 mmol/L — ABNORMAL LOW (ref 135–145)
TOTAL PROTEIN: 6.1 g/dL (ref 6.0–8.3)

## 2014-03-19 LAB — CBC
HEMATOCRIT: 27.4 % — AB (ref 36.0–46.0)
Hemoglobin: 9 g/dL — ABNORMAL LOW (ref 12.0–15.0)
MCH: 23.9 pg — ABNORMAL LOW (ref 26.0–34.0)
MCHC: 32.8 g/dL (ref 30.0–36.0)
MCV: 72.7 fL — ABNORMAL LOW (ref 78.0–100.0)
Platelets: 216 10*3/uL (ref 150–400)
RBC: 3.77 MIL/uL — AB (ref 3.87–5.11)
RDW: 14.2 % (ref 11.5–15.5)
WBC: 8.6 10*3/uL (ref 4.0–10.5)

## 2014-03-19 LAB — US OB FOLLOW UP

## 2014-03-19 LAB — TYPE AND SCREEN
ABO/RH(D): A POS
Antibody Screen: NEGATIVE

## 2014-03-19 LAB — OB RESULTS CONSOLE HIV ANTIBODY (ROUTINE TESTING): HIV: REACTIVE

## 2014-03-19 LAB — PROTEIN / CREATININE RATIO, URINE
Creatinine, Urine: 57 mg/dL
Protein Creatinine Ratio: 0.14 (ref 0.00–0.15)
Total Protein, Urine: 8 mg/dL

## 2014-03-19 LAB — ABO/RH: ABO/RH(D): A POS

## 2014-03-19 MED ORDER — DARUNAVIR-COBICISTAT 800-150 MG PO TABS
1.0000 | ORAL_TABLET | Freq: Every day | ORAL | Status: DC
  Administered 2014-03-20 – 2014-03-22 (×3): 1 via ORAL

## 2014-03-19 MED ORDER — EPHEDRINE 5 MG/ML INJ
10.0000 mg | INTRAVENOUS | Status: DC | PRN
  Filled 2014-03-19: qty 2

## 2014-03-19 MED ORDER — DIPHENHYDRAMINE HCL 50 MG/ML IJ SOLN: 12.5000 mg | INTRAMUSCULAR | Status: DC | PRN

## 2014-03-19 MED ORDER — PHENYLEPHRINE 40 MCG/ML (10ML) SYRINGE FOR IV PUSH (FOR BLOOD PRESSURE SUPPORT)
80.0000 ug | PREFILLED_SYRINGE | INTRAVENOUS | Status: DC | PRN
  Filled 2014-03-19: qty 2
  Filled 2014-03-19: qty 20

## 2014-03-19 MED ORDER — OXYTOCIN 40 UNITS IN LACTATED RINGERS INFUSION - SIMPLE MED
62.5000 mL/h | INTRAVENOUS | Status: DC
  Administered 2014-03-20: 62.5 mL/h via INTRAVENOUS
  Filled 2014-03-19: qty 1000

## 2014-03-19 MED ORDER — OXYTOCIN BOLUS FROM INFUSION: 500.0000 mL | INTRAVENOUS | Status: DC

## 2014-03-19 MED ORDER — EMTRICITABINE-TENOFOVIR DF 200-300 MG PO TABS
1.0000 | ORAL_TABLET | Freq: Every day | ORAL | Status: DC
  Administered 2014-03-20 – 2014-03-22 (×3): 1 via ORAL
  Filled 2014-03-19 (×4): qty 1

## 2014-03-19 MED ORDER — ZIDOVUDINE 10 MG/ML IV SOLN
1.0000 mg/kg/h | INTRAVENOUS | Status: DC
  Administered 2014-03-19 – 2014-03-20 (×7): 1 mg/kg/h via INTRAVENOUS
  Filled 2014-03-19 (×6): qty 40

## 2014-03-19 MED ORDER — LACTATED RINGERS IV SOLN
INTRAVENOUS | Status: DC
Start: 2014-03-19 — End: 2014-03-20
  Administered 2014-03-20 (×2): via INTRAVENOUS

## 2014-03-19 MED ORDER — LIDOCAINE HCL (PF) 1 % IJ SOLN
30.0000 mL | INTRAMUSCULAR | Status: DC | PRN
  Filled 2014-03-19: qty 30

## 2014-03-19 MED ORDER — ZIDOVUDINE 10 MG/ML IV SOLN
2.0000 mg/kg | Freq: Once | INTRAVENOUS | Status: AC
  Administered 2014-03-19: 221 mg via INTRAVENOUS
  Filled 2014-03-19: qty 22.1

## 2014-03-19 MED ORDER — PHENYLEPHRINE 40 MCG/ML (10ML) SYRINGE FOR IV PUSH (FOR BLOOD PRESSURE SUPPORT)
80.0000 ug | PREFILLED_SYRINGE | INTRAVENOUS | Status: DC | PRN
  Filled 2014-03-19: qty 2

## 2014-03-19 MED ORDER — ACETAMINOPHEN 325 MG PO TABS
650.0000 mg | ORAL_TABLET | ORAL | Status: DC | PRN
  Administered 2014-03-19: 650 mg via ORAL
  Filled 2014-03-19: qty 2

## 2014-03-19 MED ORDER — ONDANSETRON HCL 4 MG/2ML IJ SOLN
4.0000 mg | Freq: Four times a day (QID) | INTRAMUSCULAR | Status: DC | PRN
  Administered 2014-03-19 – 2014-03-20 (×3): 4 mg via INTRAVENOUS
  Filled 2014-03-19 (×3): qty 2

## 2014-03-19 MED ORDER — LACTATED RINGERS IV SOLN
500.0000 mL | Freq: Once | INTRAVENOUS | Status: AC
  Administered 2014-03-20: 500 mL via INTRAVENOUS

## 2014-03-19 MED ORDER — PENICILLIN G POTASSIUM 5000000 UNITS IJ SOLR
5.0000 10*6.[IU] | Freq: Once | INTRAVENOUS | Status: AC
  Administered 2014-03-19: 5 10*6.[IU] via INTRAVENOUS
  Filled 2014-03-19: qty 5

## 2014-03-19 MED ORDER — OXYTOCIN 40 UNITS IN LACTATED RINGERS INFUSION - SIMPLE MED
1.0000 m[IU]/min | INTRAVENOUS | Status: DC
  Administered 2014-03-19: 2 m[IU]/min via INTRAVENOUS
  Administered 2014-03-20: 10 m[IU]/min via INTRAVENOUS
  Administered 2014-03-20: 14 m[IU]/min via INTRAVENOUS

## 2014-03-19 MED ORDER — PENICILLIN G POTASSIUM 5000000 UNITS IJ SOLR
2.5000 10*6.[IU] | INTRAMUSCULAR | Status: DC
  Administered 2014-03-19 – 2014-03-20 (×4): 2.5 10*6.[IU] via INTRAVENOUS
  Filled 2014-03-19 (×9): qty 2.5

## 2014-03-19 MED ORDER — SODIUM CHLORIDE 0.9 % IV SOLN: INTRAVENOUS | Status: DC

## 2014-03-19 MED ORDER — OXYCODONE-ACETAMINOPHEN 5-325 MG PO TABS
1.0000 | ORAL_TABLET | ORAL | Status: DC | PRN
  Administered 2014-03-20: 1 via ORAL

## 2014-03-19 MED ORDER — OXYCODONE-ACETAMINOPHEN 5-325 MG PO TABS
2.0000 | ORAL_TABLET | ORAL | Status: DC | PRN
  Filled 2014-03-19: qty 2

## 2014-03-19 MED ORDER — CITRIC ACID-SODIUM CITRATE 334-500 MG/5ML PO SOLN
30.0000 mL | ORAL | Status: DC | PRN
  Administered 2014-03-19: 30 mL via ORAL
  Filled 2014-03-19: qty 15
  Filled 2014-03-19: qty 30

## 2014-03-19 MED ORDER — TERBUTALINE SULFATE 1 MG/ML IJ SOLN: 0.2500 mg | Freq: Once | INTRAMUSCULAR | Status: AC | PRN

## 2014-03-19 MED ORDER — LACTATED RINGERS IV SOLN: 500.0000 mL | INTRAVENOUS | Status: DC | PRN

## 2014-03-19 MED ORDER — FLEET ENEMA 7-19 GM/118ML RE ENEM: 1.0000 | ENEMA | RECTAL | Status: DC | PRN

## 2014-03-19 MED ORDER — FENTANYL 2.5 MCG/ML BUPIVACAINE 1/10 % EPIDURAL INFUSION (WH - ANES)
14.0000 mL/h | INTRAMUSCULAR | Status: DC | PRN
  Administered 2014-03-20 (×2): 14 mL/h via EPIDURAL
  Filled 2014-03-19 (×3): qty 125

## 2014-03-19 NOTE — Progress Notes (Signed)
LABOR PROGRESS NOTE  Catherine MccallumMarkita L Simmons is a 29 y.o. G3P2002 at 647w5d  admitted for induction of labor due to chronic hypertension.  Subjective: Headache resolved. Denies scotoma, RUQ pain, edema or other concerning symptoms.   Objective: BP 142/84 mmHg  Pulse 96  Temp(Src) 97.9 F (36.6 C) (Oral)  Resp 16  Ht 5\' 2"  (1.575 m)  Wt 244 lb (110.678 kg)  BMI 44.62 kg/m2  SpO2 100%  LMP 06/21/2013 or  Filed Vitals:   03/19/14 1753 03/19/14 1833 03/19/14 1918 03/19/14 1932  BP: 164/100  162/107 142/84  Pulse: 100 104 108 96  Temp: 97.9 F (36.6 C)     TempSrc: Oral     Resp: 18  18 16   Height:      Weight:      SpO2:  100%         FHT:  FHR: 125 bpm, variability: moderate,  accelerations:  Present,  decelerations:  Absent UC:   irregular SVE:   Dilation: 2.5 Effacement (%): 50 Station: -2 Exam by:: Gazella Anglin MD  Dilation: 2.5 Effacement (%): 50 Cervical Position: Posterior Station: -2 Presentation: Vertex Exam by:: Darlean Warmoth MD   Labs: Lab Results  Component Value Date   WBC 8.6 03/19/2014   HGB 9.0* 03/19/2014   HCT 27.4* 03/19/2014   MCV 72.7* 03/19/2014   PLT 216 03/19/2014    Assessment / Plan: Induction of labor due to chronic hypertension.   Labor: Induction not yet started as awaiting completion of 4 hours of AZT. Plan to recheck cervix at 2100 and start induction at that time.  HIV positive: AZT load complete. Continue infusion per protocol. Will have 4 hours of treatment at 2100. Viral load 1 month ago < 20. Continue home medications.  Chronic Hypertension: Blood pressure mostly mild range with one severe range within the last hour followed by mild range pressures. Follow closely. Will have a low threshold to start magnesium for blood pressures > 160/110. HELLP neg. UPC 0.14 on admission.  GBS: Positive, on PCN.  Fetal Wellbeing:  Category I Pain Control:  Epidural Anticipated MOD:  NSVD  William DaltonMcEachern, Lenford Beddow, MD 03/19/2014, 7:35 PM

## 2014-03-19 NOTE — Progress Notes (Signed)
LABOR PROGRESS NOTE  Catherine MccallumMarkita L Simmons is a 29 y.o. G3P2002 at 2464w5d  admitted for induction of labor due to chronic hypertension.  Subjective: Headache resolved. Denies scotoma, RUQ pain, edema or other concerning symptoms.   Objective: BP 142/84 mmHg  Pulse 96  Temp(Src) 97.9 F (36.6 C) (Oral)  Resp 16  Ht 5\' 2"  (1.575 m)  Wt 244 lb (110.678 kg)  BMI 44.62 kg/m2  SpO2 100%  LMP 06/21/2013 or  Filed Vitals:   03/19/14 1753 03/19/14 1833 03/19/14 1918 03/19/14 1932  BP: 164/100  162/107 142/84  Pulse: 100 104 108 96  Temp: 97.9 F (36.6 C)     TempSrc: Oral     Resp: 18  18 16   Height:      Weight:      SpO2:  100%         FHT:  FHR: 135 bpm, variability: moderate,  accelerations:  Present,  decelerations:  Absent UC:   irregular SVE:   Dilation: 2.5 Effacement (%): 50 Station: -2 Exam by:: Kendyl Bissonnette MD  Dilation: 2.5 Effacement (%): 50 Cervical Position: Posterior Station: -2 Presentation: Vertex Exam by:: Marlow Berenguer MD   Labs: Lab Results  Component Value Date   WBC 8.6 03/19/2014   HGB 9.0* 03/19/2014   HCT 27.4* 03/19/2014   MCV 72.7* 03/19/2014   PLT 216 03/19/2014    Assessment / Plan: Induction of labor due to chronic hypertension.   Labor: Induction of labor with pitocin. Continue to increase per protocol.   HIV positive: AZT load complete. Continue infusion per protocol. Viral load 1 month ago < 20. Continue home medications.  Chronic Hypertension: One severe range pressure earlier, mild range pressures since last check. Follow closely. Will have a low threshold to start magnesium for blood pressures > 160/110. HELLP neg. UPC 0.14 on admission.  GBS: Positive, on PCN.  Fetal Wellbeing:  Category I Pain Control:  Epidural Anticipated MOD:  NSVD  William DaltonMcEachern, Lynel Forester, MD 03/19/2014, 10:49 PM

## 2014-03-19 NOTE — Progress Notes (Signed)
Catherine MccallumMarkita L Simmons is a 29 y.o. G3P2002 at 6844w5d admitted for induction of labor [facility]  Subjective: Pt feeling some contractions now.  Objective: BP 142/84 mmHg  Pulse 96  Temp(Src) 97.9 F (36.6 C) (Oral)  Resp 16  Ht 5\' 2"  (1.575 m)  Wt 110.678 kg (244 lb)  BMI 44.62 kg/m2  SpO2 100%  LMP 06/21/2013 I/O last 3 completed shifts: In: 643.4 [P.O.:240; I.V.:281.3; IV Piggyback:122.1] Out: -     FHT:  FHR: 145 bpm, variability: moderate,  accelerations:  Present,  decelerations:  Absent SVE:   Dilation: 2.5 Effacement (%): 50 Station: -2 Exam by:: McEachern MD  Labs: Lab Results  Component Value Date   WBC 8.6 03/19/2014   HGB 9.0* 03/19/2014   HCT 27.4* 03/19/2014   MCV 72.7* 03/19/2014   PLT 216 03/19/2014    Assessment / Plan: IOl for CHTN  Labor: start pitocin at this time Preeclampsia:  no signs or symptoms of toxicity Fetal Wellbeing:  Category I Pain Control:  desires epidral I/D:  GBS pos Anticipated MOD:  NSVD  Rolm BookbinderMoss, Catherine Spikes 03/19/2014, 9:22 PM

## 2014-03-19 NOTE — Progress Notes (Signed)
Pt states she had a gush of clear fluid from vagina 1 hour ago.  Pt endorses swelling of hands and fingers and H/A today - pain scale 6, she also is seeing "little spots".  Results of NST, BP readings and pt's physical status called to Dr. Penne LashLeggett. Pt to be admitted for IOL today. [facility] and pt was sent to MAU registration.

## 2014-03-19 NOTE — H&P (Cosign Needed)
LABOR ADMISSION HISTORY AND PHYSICAL  Catherine Simmons is a 29 y.o. female G3P2002 with IUP at [redacted]w[redacted]d by LMP presenting for induction of labor secondary to worsening chronic hypertension concerning for superimposed preeclampsia. She endorses headache today. Denies shortness of breath, RUQ abdominal pain, edema or other concerning symptoms. +Fetal movement. +Scant fluid this morning, but not continuing to leak or have discharge. Irregular uterine contractions. Denies vaginal bleeding.  She plans on bottle feeding. She request OCPs for birth control.  Dating: By LMP --->  Estimated Date of Delivery: 03/28/14  Clinic HRC  Dating LMP  Genetic Screen Quad:  Negative     Anatomic [place] normal  GTT Early: 110              Third trimester: 119  TDaP vaccine 01/10/14  Flu vaccine 10/12/13  GBS Positive  Baby Food bottle  Contraception Desires BTL > consent signed 11/16/13  Circumcision Yes  Pediatrician [facility]    Prenatal History/Complications: - HIV positive, on medications - Chronic hypertension, no medications - Sickle cell trait  Past Medical History: Past Medical History  Diagnosis Date  . Cholecystitis 07/16/10  . Hypertension   . HIV (human immunodeficiency virus infection)   . Genital warts 2004  . Sickle cell trait     Past Surgical History: Past Surgical History  Procedure Laterality Date  . Cholecystectomy    . Wisdom tooth extraction      Obstetrical History: OB History    Gravida Para Term Preterm AB TAB SAB Ectopic Multiple Living   Social History: History   Social History  . Marital Status: Single    Spouse Name: N/A    Number of Children: N/A  . Years of Education: N/A   Social History Main Topics  . Smoking status: Former Smoker    Start date: 03/11/2012    Quit date: 08/24/2012  . Smokeless tobacco: Never Used  . Alcohol Use: No  . Drug Use: No  . Sexual Activity:    Partners: Male    Birth Control/ Protection: None   Other Topics  Concern  . None   Social History Narrative    Family History: Family History  Problem Relation Age of Onset  . Cancer Mother   . Diabetes Maternal Aunt     Allergies: Allergies  Allergen Reactions  . Stadol [Butorphanol Tartrate] Itching    Prescriptions prior to admission  Medication Sig Dispense Refill Last Dose  . darunavir-cobicistat (PREZCOBIX) 800-150 MG per tablet Take 1 tablet by mouth daily. Swallow whole. Do NOT crush, break or chew tablets. Take with food. 30 tablet 11 03/19/2014 at 1000  . emtricitabine-tenofovir (TRUVADA) 200-300 MG per tablet Take 1 tablet by mouth daily. 30 tablet 11 03/19/2014 at 1000  . ondansetron (ZOFRAN-ODT) 8 MG disintegrating tablet DISSOLVE ONE TABLET BY MOUTH EVERY 8 HOURS AS NEEDED FOR NAUSEA OR VOMITING 30 tablet 0 03/19/2014 at Unknown time  . zolpidem (AMBIEN CR) 6.25 MG CR tablet Take 6.25 mg by mouth at bedtime as needed for sleep.   Past Week at Unknown time     Review of Systems   All systems reviewed and negative except as stated in HPI  Blood pressure 143/97, pulse 97, temperature 97.9 F (36.6 C), temperature source Oral, resp. rate 20, height  (1.575 m), weight 244 lb (110.678 kg), last menstrual period 06/21/2013. General appearance: alert, cooperative and no distress Lungs: normal  effort Heart: normal rate Abdomen: soft, non-tender; bowel sounds normal Extremities: Homans sign is negative, no sign of DVT Presentation: cephalic Fetal monitoringBaseline: 135 bpm, Variability: Good {> 6 bpm), Accelerations: Reactive and Decelerations: Absent Uterine activity Irregular  Dilation: 2.5 Effacement (%): 50 Station: -2 Exam by:: Jacqualynn Parco MD   Prenatal labs: ABO, Rh: A/POS/-- (12/02 1630) Antibody: NEG (12/02 1630) Rubella:   RPR: NON REAC (12/02 1630)  HBsAg: NEGATIVE (12/02 1630)  HIV: Reactive (02/08 0000)  GBS: Positive (01/18 0000)  1 hr Glucola Early 110, 3rd 119 Genetic screening  Negative QUAD Anatomy US  Normal   Results for orders placed or performed during the hospital encounter of 03/19/14 (from the past 24 hour(s))  OB RESULTS CONSOLE HIV antibody   Collection Time: 03/19/14 12:00 AM  Result Value Ref Range   HIV Reactive     Patient Active Problem List   Diagnosis Date Noted  . Chronic hypertension 03/19/2014  . [redacted] weeks gestation of pregnancy   . Group B Streptococcus carrier, +RV culture, currently pregnant 03/01/2014  . Obesity affecting pregnancy in third trimester, antepartum   . Preexisting hypertension complicating pregnancy, antepartum   . HIV disease affecting pregnancy, antepartum 11/16/2013  . Supervision of high-risk pregnancy 08/31/2013  . HTN in pregnancy, chronic 08/31/2013  . Hx of pelvic inflammatory disease 08/31/2013  . Anxiety 06/30/2013  . Human immunodeficiency virus (HIV) disease 10/12/2012  . Other disorder of menstruation and other abnormal bleeding from female genital tract 10/07/2012  . Pelvic pain 06/03/2012  . CARPAL TUNNEL SYNDROME 11/14/2009  . NUMMULAR ECZEMA 11/14/2009  . OBESITY 05/09/2008  . SMOKER 05/09/2008  . DEPRESSION 05/09/2008    Assessment: Catherine Simmons is a 29 y.o. G3P2002 at 2836w5d here for induction of labor secondary to headache and worsening blood pressures (history of chronic hypertension), concerning for possibles superimposed pre-eclampsia.   #Labor: SVE 2-3/50/-3. Plan to start pitocin after 4 hours of AZT protocol.  #HIV positive: AZT load and infusion per protocol. Continue home medications.  #Chronic hypertension: Blood pressures mild range although elevated from baseline. Will treat headache with tylenol. Cycle blood pressures. Check HELLP, UPC. Monitor for signs of severe disease and start magnesium if indicated.  #Pain: Epidural #FWB: Category I #ID:  GBS pos, penicillin.  #MOF: Bottle #MOC:OCPs #Circ:  desires  William DaltonMcEachern, Tavian Callander 03/19/2014, 3:55 PM

## 2014-03-20 ENCOUNTER — Inpatient Hospital Stay (HOSPITAL_COMMUNITY): Payer: Medicaid Other | Admitting: Anesthesiology

## 2014-03-20 ENCOUNTER — Encounter (HOSPITAL_COMMUNITY): Payer: Self-pay

## 2014-03-20 DIAGNOSIS — O9872 Human immunodeficiency virus [HIV] disease complicating childbirth: Secondary | ICD-10-CM

## 2014-03-20 DIAGNOSIS — O99824 Streptococcus B carrier state complicating childbirth: Secondary | ICD-10-CM

## 2014-03-20 DIAGNOSIS — Z3A38 38 weeks gestation of pregnancy: Secondary | ICD-10-CM

## 2014-03-20 DIAGNOSIS — O133 Gestational [pregnancy-induced] hypertension without significant proteinuria, third trimester: Secondary | ICD-10-CM

## 2014-03-20 LAB — CBC
HEMATOCRIT: 26.8 % — AB (ref 36.0–46.0)
Hemoglobin: 8.7 g/dL — ABNORMAL LOW (ref 12.0–15.0)
MCH: 23.6 pg — ABNORMAL LOW (ref 26.0–34.0)
MCHC: 32.5 g/dL (ref 30.0–36.0)
MCV: 72.8 fL — ABNORMAL LOW (ref 78.0–100.0)
Platelets: 198 10*3/uL (ref 150–400)
RBC: 3.68 MIL/uL — AB (ref 3.87–5.11)
RDW: 14.3 % (ref 11.5–15.5)
WBC: 9.1 10*3/uL (ref 4.0–10.5)

## 2014-03-20 LAB — MRSA PCR SCREENING: MRSA by PCR: NEGATIVE

## 2014-03-20 LAB — RPR: RPR Ser Ql: NONREACTIVE

## 2014-03-20 MED ORDER — PRENATAL MULTIVITAMIN CH
1.0000 | ORAL_TABLET | Freq: Every day | ORAL | Status: DC
  Administered 2014-03-20 – 2014-03-22 (×3): 1 via ORAL
  Filled 2014-03-20 (×3): qty 1

## 2014-03-20 MED ORDER — DIPHENHYDRAMINE HCL 25 MG PO CAPS
25.0000 mg | ORAL_CAPSULE | Freq: Four times a day (QID) | ORAL | Status: DC | PRN
Start: 2014-03-20 — End: 2014-03-22

## 2014-03-20 MED ORDER — SENNOSIDES-DOCUSATE SODIUM 8.6-50 MG PO TABS
2.0000 | ORAL_TABLET | ORAL | Status: DC
  Administered 2014-03-21 – 2014-03-22 (×2): 2 via ORAL
  Filled 2014-03-20: qty 2

## 2014-03-20 MED ORDER — IBUPROFEN 600 MG PO TABS
600.0000 mg | ORAL_TABLET | Freq: Four times a day (QID) | ORAL | Status: DC
  Administered 2014-03-20 – 2014-03-22 (×9): 600 mg via ORAL
  Filled 2014-03-20 (×9): qty 1

## 2014-03-20 MED ORDER — FENTANYL 2.5 MCG/ML BUPIVACAINE 1/10 % EPIDURAL INFUSION (WH - ANES)
INTRAMUSCULAR | Status: DC | PRN
  Administered 2014-03-20: 14 mL/h via EPIDURAL

## 2014-03-20 MED ORDER — LACTATED RINGERS IV SOLN
INTRAVENOUS | Status: AC
  Administered 2014-03-20 – 2014-03-21 (×3): via INTRAVENOUS

## 2014-03-20 MED ORDER — MAGNESIUM SULFATE 40 G IN LACTATED RINGERS - SIMPLE
1.0000 g/h | INTRAVENOUS | Status: DC
Start: 2014-03-20 — End: 2014-03-21
  Filled 2014-03-20: qty 500

## 2014-03-20 MED ORDER — LABETALOL HCL 5 MG/ML IV SOLN: 20.0000 mg | INTRAVENOUS | Status: DC | PRN

## 2014-03-20 MED ORDER — MAGNESIUM SULFATE BOLUS VIA INFUSION
4.0000 g | Freq: Once | INTRAVENOUS | Status: AC
  Administered 2014-03-20: 4 g via INTRAVENOUS
  Filled 2014-03-20: qty 500

## 2014-03-20 MED ORDER — SIMETHICONE 80 MG PO CHEW
80.0000 mg | CHEWABLE_TABLET | ORAL | Status: DC | PRN
  Administered 2014-03-20: 80 mg via ORAL
  Filled 2014-03-20: qty 1

## 2014-03-20 MED ORDER — DIBUCAINE 1 % RE OINT: 1.0000 "application " | TOPICAL_OINTMENT | RECTAL | Status: DC | PRN

## 2014-03-20 MED ORDER — TETANUS-DIPHTH-ACELL PERTUSSIS 5-2.5-18.5 LF-MCG/0.5 IM SUSP
0.5000 mL | Freq: Once | INTRAMUSCULAR | Status: DC
  Filled 2014-03-20: qty 0.5

## 2014-03-20 MED ORDER — LIDOCAINE HCL (PF) 1 % IJ SOLN
INTRAMUSCULAR | Status: DC | PRN
  Administered 2014-03-20 (×2): 4 mL

## 2014-03-20 MED ORDER — FENTANYL CITRATE 0.05 MG/ML IJ SOLN
INTRAMUSCULAR | Status: AC
  Administered 2014-03-20: 100 ug via INTRAVENOUS
  Filled 2014-03-20: qty 2

## 2014-03-20 MED ORDER — ONDANSETRON HCL 4 MG PO TABS: 4.0000 mg | ORAL_TABLET | ORAL | Status: DC | PRN

## 2014-03-20 MED ORDER — WITCH HAZEL-GLYCERIN EX PADS
1.0000 "application " | MEDICATED_PAD | CUTANEOUS | Status: DC | PRN
  Administered 2014-03-22: 1 via TOPICAL

## 2014-03-20 MED ORDER — BENZOCAINE-MENTHOL 20-0.5 % EX AERO
1.0000 "application " | INHALATION_SPRAY | CUTANEOUS | Status: DC | PRN
  Administered 2014-03-21: 1 via TOPICAL
  Filled 2014-03-20: qty 56

## 2014-03-20 MED ORDER — FENTANYL CITRATE 0.05 MG/ML IJ SOLN
100.0000 ug | INTRAMUSCULAR | Status: DC | PRN
  Administered 2014-03-20: 100 ug via INTRAVENOUS

## 2014-03-20 MED ORDER — ONDANSETRON HCL 4 MG/2ML IJ SOLN: 4.0000 mg | INTRAMUSCULAR | Status: DC | PRN

## 2014-03-20 MED ORDER — OXYTOCIN 40 UNITS IN LACTATED RINGERS INFUSION - SIMPLE MED: 62.5000 mL/h | INTRAVENOUS | Status: DC | PRN

## 2014-03-20 MED ORDER — LANOLIN HYDROUS EX OINT: TOPICAL_OINTMENT | CUTANEOUS | Status: DC | PRN

## 2014-03-20 NOTE — Anesthesia Preprocedure Evaluation (Signed)
Anesthesia Evaluation  Patient identified by MRN, date of birth, ID band Patient awake    Reviewed: Allergy & Precautions, NPO status , Patient's Chart, lab work & pertinent test results  History of Anesthesia Complications Negative for: history of anesthetic complications  Airway Mallampati: III  TM Distance: >3 FB Neck ROM: Full    Dental no notable dental hx. (+) Edentulous Lower, Edentulous Upper   Pulmonary former smoker,  breath sounds clear to auscultation  Pulmonary exam normal       Cardiovascular hypertension, Pt. on medications Rhythm:Regular Rate:Normal     Neuro/Psych PSYCHIATRIC DISORDERS Anxiety Depression negative neurological ROS     GI/Hepatic negative GI ROS, Neg liver ROS,   Endo/Other  Morbid obesity  Renal/GU negative Renal ROS  negative genitourinary   Musculoskeletal negative musculoskeletal ROS (+)   Abdominal   Peds negative pediatric ROS (+)  Hematology  (+) HIV,   Anesthesia Other Findings   Reproductive/Obstetrics (+) Pregnancy                             Anesthesia Physical Anesthesia Plan  ASA: III  Anesthesia Plan: Epidural   Post-op Pain Management:    Induction:   Airway Management Planned:   Additional Equipment:   Intra-op Plan:   Post-operative Plan:   Informed Consent: I have reviewed the patients History and Physical, chart, labs and discussed the procedure including the risks, benefits and alternatives for the proposed anesthesia with the patient or authorized representative who has indicated his/her understanding and acceptance.   Dental advisory given  Plan Discussed with:   Anesthesia Plan Comments:         Anesthesia Quick Evaluation

## 2014-03-20 NOTE — Plan of Care (Signed)
Problem: Phase I Progression Outcomes Goal: Assess per MD/Nurse,Routine-VS,FHR,UC,Head to Toe assess Outcome: Progressing Continuing to assess maternal BPs. Education provided to patient of signs and symptoms of preeclampsia. Pt verbalizes understanding.

## 2014-03-20 NOTE — Anesthesia Procedure Notes (Signed)
Epidural Patient location during procedure: OB Start time: 03/20/2014 3:24 AM  Staffing Anesthesiologist: Felipe DroneJUDD, Rayane Gallardo JENNETTE Performed by: anesthesiologist   Preanesthetic Checklist Completed: patient identified, site marked, surgical consent, pre-op evaluation, timeout performed, IV checked, risks and benefits discussed and monitors and equipment checked  Epidural Patient position: sitting Prep: site prepped and draped and DuraPrep Patient monitoring: continuous pulse ox and blood pressure Approach: midline Location: L3-L4 Injection technique: LOR saline  Needle:  Needle type: Tuohy  Needle gauge: 17 G Needle length: 9 cm and 9 Needle insertion depth: 6 cm Catheter type: closed end flexible Catheter size: 19 Gauge Catheter at skin depth: 12 cm Test dose: negative  Assessment Events: blood not aspirated, injection not painful, no injection resistance, negative IV test and no paresthesia  Additional Notes Patient identified. Risks/Benefits/Options discussed with patient including but not limited to bleeding, infection, nerve damage, paralysis, failed block, incomplete pain control, headache, blood pressure changes, nausea, vomiting, reactions to medication both or allergic, itching and postpartum back pain. Confirmed with bedside nurse the patient's most recent platelet count. Confirmed with patient that they are not currently taking any anticoagulation, have any bleeding history or any family history of bleeding disorders. Patient expressed understanding and wished to proceed. All questions were answered. Sterile technique was used throughout the entire procedure. Please see nursing notes for vital signs. Test dose was given through epidural catheter and negative prior to continuing to dose epidural or start infusion. Warning signs of high block given to the patient including shortness of breath, tingling/numbness in hands, complete motor block, or any concerning symptoms with  instructions to call for help. Patient was given instructions on fall risk and not to get out of bed. All questions and concerns addressed with instructions to call with any issues or inadequate analgesia.

## 2014-03-20 NOTE — Progress Notes (Signed)
Catherine MccallumMarkita L Simmons is a 29 y.o. G3P2002 at 5579w6d [facility]  Subjective: Pt c/o nausea. S/p epidural so more comfortable.  Objective: BP 137/98 mmHg  Pulse 104  Temp(Src) 98.1 F (36.7 C) (Oral)  Resp 20  Ht 5\' 2"  (1.575 m)  Wt 110.678 kg (244 lb)  BMI 44.62 kg/m2  SpO2 94%  LMP 06/21/2013 I/O last 3 completed shifts: In: 851.1 [P.O.:240; I.V.:489; IV Piggyback:122.1] Out: -  Total I/O In: 2048.5 [P.O.:720; I.V.:1228.5; IV Piggyback:100] Out: 1300 [Urine:1300]  FHT:  FHR: 150 bpm, variability: moderate,  accelerations:  Present,  decelerations:  Absent UC:   irregular, every 1-3 minutes SVE:   Dilation: 4 Effacement (%): 60 Station: -3 Exam by:: Dr. Ane PaymentMcEachern  Labs: Lab Results  Component Value Date   WBC 8.6 03/19/2014   HGB 9.0* 03/19/2014   HCT 27.4* 03/19/2014   MCV 72.7* 03/19/2014   PLT 216 03/19/2014    Assessment / Plan: IOL for CHTN  Labor: little cervical change. will continue to increase pitocin, then AROM. Preeclampsia:  no signs or symptoms of toxicity and start mag sulfate at this time due to severe bp  Fetal Wellbeing:  Category I Pain Control:  Epidural I/D:  GBS pos Anticipated MOD:  NSVD  Catherine Simmons, Catherine Simmons 03/20/2014, 4:51 AM

## 2014-03-20 NOTE — Progress Notes (Addendum)
Delivery of live viable female by McKeag, Do assisted by Dr Loreta AveAcosta. APAGARS 9,9

## 2014-03-20 NOTE — Progress Notes (Signed)
Catherine MccallumMarkita L Simmons is a 29 y.o. G3P2002 at 6346w6d [facility]  Subjective: Pt c/o of 8/10 pain with contractions.  Objective: BP 127/88 mmHg  Pulse 98  Temp(Src) 98.1 F (36.7 C) (Oral)  Resp 16  Ht 5\' 2"  (1.575 m)  Wt 110.678 kg (244 lb)  BMI 44.62 kg/m2  SpO2 100%  LMP 06/21/2013 I/O last 3 completed shifts: In: 851.1 [P.O.:240; I.V.:489; IV Piggyback:122.1] Out: -  Total I/O In: 2048.5 [P.O.:720; I.V.:1228.5; IV Piggyback:100] Out: 1300 [Urine:1300]  FHT:  FHR: 140 bpm, variability: moderate,  accelerations:  Present,  decelerations:  Absent SVE:   Dilation: 4 Effacement (%): 60 Station: -3 Exam by:: Dr. Ane PaymentMcEachern  Labs: Lab Results  Component Value Date   WBC 8.6 03/19/2014   HGB 9.0* 03/19/2014   HCT 27.4* 03/19/2014   MCV 72.7* 03/19/2014   PLT 216 03/19/2014    Assessment / Plan: IOL for GHTN  Labor: Progressing on Pitocin, will continue to increase then AROM Preeclampsia:  no signs or symptoms of toxicity Fetal Wellbeing:  Category I Pain Control:  desires epidural I/D:  GBS pos Anticipated MOD:  NSVD  Catherine Simmons 03/20/2014, 1:37 AM

## 2014-03-21 ENCOUNTER — Inpatient Hospital Stay (HOSPITAL_COMMUNITY): Admission: RE | Admit: 2014-03-21 | Payer: Medicaid Other | Source: Ambulatory Visit

## 2014-03-21 ENCOUNTER — Encounter (HOSPITAL_COMMUNITY): Payer: Self-pay | Admitting: *Deleted

## 2014-03-21 MED ORDER — SODIUM CHLORIDE 0.9 % IJ SOLN: 3.0000 mL | INTRAMUSCULAR | Status: DC | PRN

## 2014-03-21 MED ORDER — SODIUM CHLORIDE 0.9 % IJ SOLN
3.0000 mL | Freq: Two times a day (BID) | INTRAMUSCULAR | Status: DC
  Administered 2014-03-21 – 2014-03-22 (×2): 3 mL via INTRAVENOUS

## 2014-03-21 NOTE — Anesthesia Postprocedure Evaluation (Signed)
  Anesthesia Post-op Note  Patient: Catherine MccallumMarkita L Simmons  Procedure(s) Performed: * No procedures listed *  Patient Location: A-ICU  Anesthesia Type:Epidural  Level of Consciousness: awake, alert , oriented and patient cooperative  Airway and Oxygen Therapy: Patient Spontanous Breathing  Post-op Pain: none  Post-op Assessment: Post-op Vital signs reviewed, Patient's Cardiovascular Status Stable, Respiratory Function Stable, Patent Airway, No headache, No backache, No residual numbness and No residual motor weakness  Post-op Vital Signs: Reviewed and stable  Last Vitals:  Filed Vitals:   03/21/14 0736  BP:   Pulse: 80  Temp: 36.8 C  Resp: 20    Complications: No apparent anesthesia complications

## 2014-03-21 NOTE — Progress Notes (Signed)
UR chart review completed.  

## 2014-03-21 NOTE — Progress Notes (Signed)
Reactive NST on 03/19/14

## 2014-03-21 NOTE — Progress Notes (Signed)
Post Partum Day 1 Subjective: up ad lib, voiding and tolerating PO.  Good UO.  BP normotensive  Objective: Blood pressure 121/73, pulse 77, temperature 98.2 F (36.8 C), temperature source Oral, resp. rate 16, height 5\' 2"  (1.575 m), weight 248 lb 12.8 oz (112.855 kg), last menstrual period 06/21/2013, SpO2 100 %, unknown if currently breastfeeding.  Physical Exam:  General: alert, cooperative and no distress Lochia: appropriate Uterine Fundus: firm DVT Evaluation: No evidence of DVT seen on physical exam. Negative Homan's sign. No cords or calf tenderness. 1+ pretibial edema.   Recent Labs  03/19/14 1615 03/20/14 1213  HGB 9.0* 8.7*  HCT 27.4* 26.8*    Assessment/Plan: Plan for discharge tomorrow  Magnesium off at 11:30.  Transfer to floor afterwards.   LOS: 2 days   Jurnee Nakayama JEHIEL 03/21/2014, 7:20 AM

## 2014-03-21 NOTE — Progress Notes (Signed)
Clinical Social Work Department PSYCHOSOCIAL ASSESSMENT - MATERNAL/CHILD 03/21/2014  Patient:  Catherine Simmons, Catherine Simmons  Account Number:  [id number]  Admit Date:  03/19/2014  Ardine Eng Name:   Lajoyce Corners   Clinical Social Worker:  Lucita Ferrara, CLINICAL SOCIAL WORKER   Date/Time:  03/21/2014 01:00 PM  Date Referred:  03/20/2014   Referral source  Central Nursery     Referred reason  Depression/Anxiety  O42  Substance Abuse   Other referral source:    I:  FAMILY / HOME ENVIRONMENT Child's legal guardian:  PARENT  Guardian - Name Guardian - Age Guardian - Address  Catherine Simmons [address] [place], [place] [address]  Catherine Simmons 24 different residence   Other household support members/support persons Name Relationship DOB  Catherine Simmons 2012  Catherine Simmons 2006   Other support:   MOB endorsed strong family support, and identifed the MGM as her primary support person.  She discussed that she and the FOB will be "co-parenting" only for the benefit of the infant.    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 m   Financial resources:  [employer] [place] / Grade:  [place] / Industrial/product designer / Early Interventions:   None reported  Cultural issues impacting care:   None reported    III  STRENGTHS Strengths  Adequate Resources  Home prepared for Child (including basic supplies)  Supportive family/friends   Strength comment:    IV  RISK FACTORS AND CURRENT PROBLEMS Current Problem:  YES   Risk Factor & Current Problem Patient Issue Family Issue Risk Factor / Current Problem Comment  Mental Illness Y N MOB reported history of depression and anxiety since age 75.  She  denied acute symptoms during the pregnancy.  Substance Abuse N N MOB denied substance use during the pregnancy, reported last use 3 years ago.  Baby's UDS is negative.  Other - See comment Y N MOB expressed normative worry secondary to health status of the infant.    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  MOB presented as easily engaged and receptive to the visit. She was noted to be in a pleasant mood, displayed a full range in affect, no mental health symptoms noted in behaviors or in cognitions.  MOB was noted to be interacting and bonding with the infant during the entire visit.   MOB expressed excitement upon the arrival of the infant.  As CSW met with the MOB, CSW noted that the MOB started to smile when she reflected upon her role as a mother.  She discussed how happy she feels when she interacts with her children, and she discussed that she is looking forward to returning home and transitioning to three children.  The MOB did not express any anxiety secondary to her need to play numerous roles in life since she is the primary provider in the household.  She discussed how she works two jobs in order to ensure that all basic needs are met.  The MOB endorsed strong family support, and shared that she is motivated  by her children.  She discussed how they are able to help improve her mood when she begins to feel stressed out.    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 She discussed that due to emesis, she was unable to keep medications down, and expressed fear that the infant would be at risk for HIV transfer since she could not keep her medications down.  The MOB shared that she has felt less anxious/overwhelmed  since she has well controlled HIV, and has been undetectable for "awhile".  MOB discussed belief that she will feel even better once she knows the infant's status.  CSW discussed need for follow up with pediatric infectious disease.  MOB chose follow up with [facility] confirmed demographic information, provided the MOB with the "welcome packet", and discussed what to expect at the clinic.  MOB discussed awareness of the importance of these appointments, and reported that she has access to transportation.  MOB also verbalized awareness that either this CSW or the social worker from the pediatric infection disease clinic will contact her with the initial appointment.  MOB denied additional questions or concerns regarding the infant's follow-up, and she was noted to be placing the packet of information with the rest of her infant education/information from the hospital.   During the visit, MOB acknowledged history of depression and anxiety since early adolescence. She presents with insight on potential reasons for depression/anxiety as a child.  MOB reported that she has been "doing well", but acknowledged increase in symptoms when she was first diagnosed with HIV.  MOB expressed normative feelings associated with a new diagnosis and adjusting to her new normal.  She also endorsed increase in symptoms early in the pregnancy out of concern of the infant's health and due to the termination of the relationship with the FOB.  She stated that she is doing "better" related to the end of the relationship with the FOB.  She discussed how she has enjoyed spending time with her children and other extended family members, and shared her gain self-awareness on effective strategies to assist her to improve her mood.  MOB only endorsed feeling tearful or overwhelmed during the pregnancy, and denied presence of thoughts of self-harm.  She shared that she is motivated by her children to ensure that her mental health  is doing well.   CSW inquired about substance use.  MOB denied any substance use during the pregnancy, endorsed last THC use in 2012.   No barriers to discharge.    VI SOCIAL WORK PLAN Social Work Secretary/administrator Education  Information/Referral to [employer]  No Further Intervention Required / No Barriers to Discharge   Type of pt/family education:   Postpartum depression  Hospital drug screen policy   If child protective services report - county:  N/A If child protective services report - date:  N/A Information/referral to community resources comment:   [facility]   Other social work plan:   CSW to follow up with Infectious Disease Clinic to ensure referral is completed for his after-care. MOB verbalized strong intention to follow up with pediatric infectious disease clinic.  She presents with awareness of the importance of attending all follow-up appointments, and appears motivated at this timet to attend his appointments.  MOB endorsed access to transportation and denied any barriers to care.  Once after-care is known, CSW  will ensure that it is documented in the infant and MOB's chart.

## 2014-03-22 NOTE — Discharge Summary (Signed)
Catherine MccallumMarkita L Simmons is a 29 y.o. female [id number] with IUP at 2115w5d by LMP presenting for induction of labor secondary to worsening chronic hypertension concerning for superimposed preeclampsia. Complications of pregnancy include HIV pos. Delivered baby boy via SVD. She is bottle feeding. She request OCPs for birth control. Moving around ad lib, eating and drinking, decreased vaginal bleeding, passing flatus and urinating. Blood pressure within normal range, recheck bp in 3 days.  Obstetric Discharge Summary Reason for Admission: induction of labor Prenatal Procedures: none Intrapartum Procedures: spontaneous vaginal delivery Postpartum Procedures: none Complications-Operative and Postpartum: none HEMOGLOBIN  Date Value Ref Range Status  03/20/2014 8.7* 12.0 - 15.0 g/dL Final   HGB  Date Value Ref Range Status  02/19/2014 8.7 g/dL Final   HCT  Date Value Ref Range Status  03/20/2014 26.8* 36.0 - 46.0 % Final  02/19/2014 27 % Final    Physical Exam:  General: alert, cooperative and no distress Lochia: appropriate Uterine Fundus: firm Incision: na DVT Evaluation: No evidence of DVT seen on physical exam.  Discharge Diagnoses: Term Pregnancy-delivered  Discharge Information: Date: 03/22/2014 Activity: unrestricted Diet: routine Medications: None Condition: stable Instructions: see attachment Discharge to: home   Newborn Data: Live born female  Birth Weight: 7 lb 6.5 oz (3360 g) APGAR: 9, 9  Home with mother.  Rolm BookbinderMoss, Amber 03/22/2014, 7:28 AM   I have seen and examined this patient and I agree with the above. Cam HaiSHAW, Jenni Thew CNM 9:02 AM 03/22/2014

## 2014-03-29 ENCOUNTER — Ambulatory Visit (INDEPENDENT_AMBULATORY_CARE_PROVIDER_SITE_OTHER): Payer: Medicaid Other | Admitting: Family Medicine

## 2014-03-29 VITALS — BP 139/73 | HR 78 | Wt 237.9 lb

## 2014-03-29 DIAGNOSIS — K649 Unspecified hemorrhoids: Secondary | ICD-10-CM

## 2014-03-29 DIAGNOSIS — I10 Essential (primary) hypertension: Secondary | ICD-10-CM

## 2014-03-29 MED ORDER — HYDROCORTISONE ACE-PRAMOXINE 1-1 % RE FOAM: 1.0000 | Freq: Two times a day (BID) | RECTAL | Status: DC

## 2014-03-29 NOTE — Progress Notes (Signed)
   Subjective:    Patient ID: Adriana MccallumMarkita L Beaston, female    DOB: 01/21/1986, 29 y.o.   MRN: [id number]  HPI Hemorrhoids since delivery.  Has tried tucks, cooling gel, preparation H, sitz baths without improvement.     Review of Systems  Constitutional: Negative for fever, chills and fatigue.  Gastrointestinal: Negative for nausea, abdominal pain, diarrhea, constipation and anal bleeding.       Objective:   Physical Exam  Constitutional: She appears well-developed and well-nourished.  Abdominal: Soft. Bowel sounds are normal. She exhibits no distension and no mass. There is no tenderness. There is no rebound and no guarding.  Genitourinary: Rectal exam shows external hemorrhoid. Rectal exam shows no fissure.      Assessment & Plan:   Problem List Items Addressed This Visit    Chronic hypertension - Primary    Other Visit Diagnoses    Hemorrhoids, unspecified hemorrhoid type          BP okay today No evidence of thormbosed hemorrhoid.  Recommended adequate water intake.  Will prescribe Proctofoam with hydrocortisone to use BID.  Patient to call if not improving in 2-3 days.

## 2014-03-29 NOTE — Progress Notes (Signed)
Tinnie GensJennie, nurse calling from [employer] [facility], called and informed me that the pt does have an appt scheduled today for BP check.  Tinnie GensJennie wanted to report that pt BP reading during her visit today was 158/94 right arm and left arm 166/98 both manual.  Tinnie GensJennie also informed me that pt has c/o blurred vision and headaches.  Pt has a headache today but no blurred vision.  Tinnie GensJennie also brought it to our attention that pt is c/i hemorrhoids that seems to be getting since discharge from the hospital.  Pt was given tucks from the hospital after delivery but continues to use preparation H with no effectiveness.  I informed Tinnie GensJennie that I will put in her appt to be evaluated for hemorrhoids along with her BP check.  Jennie agreed.

## 2014-03-29 NOTE — Progress Notes (Signed)
Patient here for BP check. Reports headaches "all the time." Denies any abdominal pain, chest pain, SOB. C/o of hemorrhoids-- tried tucks, cooling gel, suppositoryies, warm rags, sitz baths-- reports still in pain.

## 2014-04-04 ENCOUNTER — Encounter: Payer: Self-pay | Admitting: Obstetrics & Gynecology

## 2014-04-06 ENCOUNTER — Telehealth: Payer: Self-pay | Admitting: *Deleted

## 2014-04-06 ENCOUNTER — Encounter: Payer: Self-pay | Admitting: *Deleted

## 2014-04-06 NOTE — Telephone Encounter (Signed)
[facility] discussed we can provide her with a letter. She will pick it up on 04/09/14.

## 2014-04-06 NOTE — Telephone Encounter (Signed)
Catherine AbbeyMarkita called and left a message that she wants to get a letter stating she will be out of work for 6 weeks. 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

## 2014-04-09 ENCOUNTER — Encounter: Payer: Self-pay | Admitting: Internal Medicine

## 2014-04-09 ENCOUNTER — Ambulatory Visit (INDEPENDENT_AMBULATORY_CARE_PROVIDER_SITE_OTHER): Payer: Self-pay | Admitting: *Deleted

## 2014-04-09 VITALS — BP 177/115 | HR 56 | Temp 97.8°F | Resp 16 | Wt 246.5 lb

## 2014-04-09 DIAGNOSIS — Z006 Encounter for examination for normal comparison and control in clinical research program: Secondary | ICD-10-CM

## 2014-04-09 DIAGNOSIS — I10 Essential (primary) hypertension: Secondary | ICD-10-CM

## 2014-04-09 LAB — HEMATOCRIT, BODY FLUID: HCT: 27 %

## 2014-04-09 LAB — HEMOGLOBIN: HGB: 8.9 g/dL

## 2014-04-09 NOTE — Progress Notes (Signed)
Catherine Simmons is here with her baby [name] for her post partum study visit. She says that she and the baby are both doing well. She says that most of her symptoms stopped after the baby was born, such as nausea and abd . pain. She still currently has occassional headache, blurred vision with spots, also reports some depression, which she had before delivery.Denies anymore hemmorhoidal pain. [facility]73. Now BP is 177/115, repeat BP after sitting was 178/124. She is not taking any BP medication, but does have it at home. We discussed this with Dr. Daiva EvesVan Dam who was in the office, he wants her to resume both the amlodopine and the HCTZ she had in the past which she says she still has at home. She will return in 2 days for BP check while on medication. She is to call for any worsening symptoms. [facility], so we will need to request records from there when the baby is 4316 weeks old for documentation in study. We had her complete an authorization for release of info.

## 2014-04-10 MED ORDER — HYDROCHLOROTHIAZIDE 25 MG PO TABS: 25.0000 mg | ORAL_TABLET | Freq: Every day | ORAL | Status: DC

## 2014-04-10 MED ORDER — AMLODIPINE BESYLATE 10 MG PO TABS: 10.0000 mg | ORAL_TABLET | Freq: Every day | ORAL | Status: DC

## 2014-04-10 NOTE — Addendum Note (Signed)
Addended by: Phill MyronEPPERSON, KIMBERLY C on: 04/10/2014 03:41 PM   Modules accepted: Orders

## 2014-04-11 ENCOUNTER — Other Ambulatory Visit: Payer: Self-pay | Admitting: *Deleted

## 2014-04-11 ENCOUNTER — Ambulatory Visit: Payer: Self-pay | Admitting: *Deleted

## 2014-04-11 DIAGNOSIS — I10 Essential (primary) hypertension: Secondary | ICD-10-CM

## 2014-04-11 MED ORDER — LISINOPRIL 20 MG PO TABS: 20.0000 mg | ORAL_TABLET | Freq: Every day | ORAL | Status: DC

## 2014-04-11 MED ORDER — LISINOPRIL 10 MG PO TABS: 10.0000 mg | ORAL_TABLET | Freq: Every day | ORAL | Status: DC

## 2014-04-11 NOTE — Progress Notes (Addendum)
Catherine Simmons is here today to recheck her BP after restarting her BP medications on Monday. She said she has only taken the amlodopine for the past 2 days and she took it this morning around 7am. Bp today is still high, I called Dr. Drue SecondSnider and she prescribed Lisinopril 20mg  in addition to the HCTZ and amlodopine. I stressed the importance of her getting all her meds started and verified the [facility]. She is holding some fluid, her ankles are a little swollen and her weight has not dropped very much since delivery. She says she has been having a little headache but no other new problems. We will have her come back Monday to recheck BP. I told her  If she developed any SOB, chest pain, etc to go on to the ED. She did say she had this kind of problem after her last delivery and that she ended up in the hospital getting diuresed. She is a little tearful and worried about herself. I tried to reassure her and told her to call for any more problems.  I originally put in prescription for 10mg  that was sent to pharmacy and then realized it was supposed to be 20mg  of lisinopril. I corrected it in EPIC and sent the correction to the pharmacy, but Catherine Simmons had already picked up the 10 mg dose. I called her this afternoon and made sure she had gotten all her prescriptions and started them. She said she had but had the 10mg  dose, so I told her to take 2 of those daily for now. I did tell her that if she felt like she needed to talk with someone about her depression/anxiety that we had a counselor here and I could introduce her to him when she comes Monday.

## 2014-04-16 ENCOUNTER — Ambulatory Visit: Payer: Medicaid Other | Admitting: *Deleted

## 2014-04-16 VITALS — BP 128/84 | HR 78 | Temp 98.1°F | Wt 243.5 lb

## 2014-04-16 DIAGNOSIS — Z006 Encounter for examination for normal comparison and control in clinical research program: Secondary | ICD-10-CM

## 2014-04-16 NOTE — Progress Notes (Signed)
Catherine Simmons is back today to recheck her BP. It is much better 128/87. She is taking all 3 BP meds. She said that she had forgotten to take them with her to her Mom's house the day before yesterday and when she woke up yesterday, her chest was hurting and she felt short of breath. She went home and then took the meds and felt much better a little later. I told her to make sure she kept all her meds with her wherever she went and that she shouldn't go without them. She is scheduled to see Dr. Drue SecondSnider on April 5th and we will do the last PK visit that day. She will need a prescription for her Prezcobix or whatever regimen she will be on from that day on. She says she would like to go back to the [facility].

## 2014-04-23 ENCOUNTER — Encounter: Payer: Self-pay | Admitting: Infectious Disease

## 2014-05-03 ENCOUNTER — Ambulatory Visit: Payer: Medicaid Other | Admitting: Advanced Practice Midwife

## 2014-05-08 ENCOUNTER — Encounter: Payer: Self-pay | Admitting: Internal Medicine

## 2014-05-08 LAB — CD4/CD8 (T-HELPER/T-SUPPRESSOR CELL)
CD4%: 40.1
CD4: 1016
CD8 T CELL SUPPRESSOR: 28.3
CD8: 716

## 2014-05-08 LAB — HIV-1 RNA QUANT-NO REFLEX-BLD

## 2014-05-15 ENCOUNTER — Ambulatory Visit (INDEPENDENT_AMBULATORY_CARE_PROVIDER_SITE_OTHER): Payer: Medicaid Other | Admitting: *Deleted

## 2014-05-15 ENCOUNTER — Ambulatory Visit (INDEPENDENT_AMBULATORY_CARE_PROVIDER_SITE_OTHER): Payer: Medicaid Other | Admitting: Internal Medicine

## 2014-05-15 ENCOUNTER — Encounter: Payer: Self-pay | Admitting: Internal Medicine

## 2014-05-15 VITALS — BP 128/87 | HR 61 | Temp 97.7°F | Resp 16 | Wt 247.5 lb

## 2014-05-15 VITALS — BP 128/87 | HR 61 | Temp 97.7°F | Wt 247.0 lb

## 2014-05-15 DIAGNOSIS — I10 Essential (primary) hypertension: Secondary | ICD-10-CM

## 2014-05-15 DIAGNOSIS — Z006 Encounter for examination for normal comparison and control in clinical research program: Secondary | ICD-10-CM

## 2014-05-15 DIAGNOSIS — B20 Human immunodeficiency virus [HIV] disease: Secondary | ICD-10-CM | POA: Diagnosis not present

## 2014-05-15 DIAGNOSIS — Z21 Asymptomatic human immunodeficiency virus [HIV] infection status: Secondary | ICD-10-CM

## 2014-05-15 LAB — CD4/CD8 (T-HELPER/T-SUPPRESSOR CELL)
CD4%: 1343
CD4%: 37.9
CD8 % Suppressor T Cell: 28.3
CD8: 1004

## 2014-05-15 MED ORDER — DARUNAVIR-COBICISTAT 800-150 MG PO TABS: 1.0000 | ORAL_TABLET | Freq: Every day | ORAL | Status: DC

## 2014-05-15 MED ORDER — EMTRICITABINE-TENOFOVIR DF 200-300 MG PO TABS: 1.0000 | ORAL_TABLET | Freq: Every day | ORAL | Status: DC

## 2014-05-15 NOTE — Progress Notes (Signed)
Patient ID: Catherine Simmons, female   DOB: 10/09/1985, 29 y.o.   MRN: [id number]       Patient ID: Catherine Simmons, female   DOB: 03/19/1985, 29 y.o.   MRN: [id number]  HPI 28yo F with HIV disease, CD 4 count of 1016/VL<20 on prezcobix-truvada. She is 2 months out since delivery of healthy son. She is doing well since her delivery. She still has post pregnancy hypertension. Continues to finish out her visits with research during pregnancy. She has just recently finished having her menstrual cycle.back to work 2 days per week. Mom helps in caring for her newborn and 4 yr son, Catherine Simmons  Outpatient Encounter Prescriptions as of 05/15/2014  Medication Sig  . amLODipine (NORVASC) 10 MG tablet Take 1 tablet (10 mg total) by mouth daily.  . darunavir-cobicistat (PREZCOBIX) 800-150 MG per tablet Take 1 tablet by mouth daily. Swallow whole. Do NOT crush, break or chew tablets. Take with food.  . darunavir-cobicistat (PREZCOBIX) 800-150 MG per tablet Take 1 tablet by mouth daily. Swallow whole. Do NOT crush, break or chew tablets. Take with food.  Marland Kitchen. emtricitabine-tenofovir (TRUVADA) 200-300 MG per tablet Take 1 tablet by mouth daily.  . hydrochlorothiazide (HYDRODIURIL) 25 MG tablet Take 1 tablet (25 mg total) by mouth daily.  . hydrocortisone-pramoxine (PROCTOFOAM HC) rectal foam Place 1 applicator rectally 2 (two) times daily.  Marland Kitchen. lisinopril (PRINIVIL,ZESTRIL) 20 MG tablet Take 1 tablet (20 mg total) by mouth daily.  . ondansetron (ZOFRAN-ODT) 8 MG disintegrating tablet DISSOLVE ONE TABLET BY MOUTH EVERY 8 HOURS AS NEEDED FOR NAUSEA OR VOMITING (Patient not taking: Reported on 03/29/2014)  . zolpidem (AMBIEN CR) 6.25 MG CR tablet Take 6.25 mg by mouth at bedtime as needed for sleep.  . [DISCONTINUED] emtricitabine-tenofovir (TRUVADA) 200-300 MG per tablet Take 1 tablet by mouth daily.     Patient Active Problem List   Diagnosis Date Noted  . Chronic hypertension 03/19/2014  . [redacted] weeks gestation of  pregnancy   . Group B Streptococcus carrier, +RV culture, currently pregnant 03/01/2014  . Obesity affecting pregnancy in third trimester, antepartum   . Preexisting hypertension complicating pregnancy, antepartum   . HIV disease affecting pregnancy, antepartum 11/16/2013  . Supervision of high-risk pregnancy 08/31/2013  . HTN in pregnancy, chronic 08/31/2013  . Hx of pelvic inflammatory disease 08/31/2013  . Anxiety 06/30/2013  . Human immunodeficiency virus (HIV) disease 10/12/2012  . Other disorder of menstruation and other abnormal bleeding from female genital tract 10/07/2012  . Pelvic pain 06/03/2012  . CARPAL TUNNEL SYNDROME 11/14/2009  . NUMMULAR ECZEMA 11/14/2009  . OBESITY 05/09/2008  . SMOKER 05/09/2008  . DEPRESSION 05/09/2008     There are no preventive care reminders to display for this patient.   Review of Systems 10 point ros is negative Physical Exam   BP 128/87 mmHg  Pulse 61  Temp(Src) 97.7 F (36.5 C) (Oral)  Wt 247 lb (112.038 kg)  LMP 05/11/2014  Breastfeeding? No Physical Exam  Constitutional:  oriented to person, place, and time. appears well-developed and well-nourished. No distress.  HENT:  Mouth/Throat: Oropharynx is clear and moist. No oropharyngeal exudate.  Cardiovascular: Normal rate, regular rhythm and normal heart sounds. Exam reveals no gallop and no friction rub.  No murmur heard.  Pulmonary/Chest: Effort normal and breath sounds normal. No respiratory distress.  has no wheezes.  Psychiatric: a normal mood and affect.  behavior is normal.   Lab Results  Component Value Date   CD4TCELL 37 10/12/2013  Lab Results  Component Value Date   CD4TABS 1016 04/09/2014   CD4TABS 776 02/19/2014   CD4TABS 651 12/27/2013   Lab Results  Component Value Date   HIV1RNAQUANT <20 10/12/2013   Lab Results  Component Value Date   HEPBSAB POS* 10/20/2012   No results found for: RPR  CBC Lab Results  Component Value Date   WBC 9.1  03/20/2014   RBC 3.68* 03/20/2014   HGB 8.7* 03/20/2014   HCT 26.8* 03/20/2014   PLT 198 03/20/2014   MCV 72.8* 03/20/2014   MCH 23.6* 03/20/2014   MCHC 32.5 03/20/2014   RDW 14.3 03/20/2014   LYMPHSABS 1.7 02/21/2014   MONOABS 0.6 02/21/2014   EOSABS 0.1 02/21/2014   BASOSABS 0.0 02/21/2014   BMET Lab Results  Component Value Date   NA 133* 03/19/2014   K 4.0 03/19/2014   CL 109 03/19/2014   CO2 20 03/19/2014   GLUCOSE 80 03/19/2014   BUN 7 03/19/2014   CREATININE 0.57 03/19/2014   CALCIUM 8.3* 03/19/2014   GFRNONAA >90 03/19/2014   GFRAA >90 03/19/2014     Assessment and Plan  hiv disease = once she finishes her research visits, plan on switching back to single tab regimen, triumeq  htn = now well controlled. Continue with hctz and amlodipine  Birth control = plan to get back onto birth control. Will need to prescribe so that she can start at the next cycle  rtc in 6 wk

## 2014-05-15 NOTE — Progress Notes (Signed)
Catherine AbbeyMarkita is here for her 3rd PK visit for the [place] study . This will be the final visit unless she has some abnormal labs that will require followup 4 weeks from now. She denies any new problems or concerns. Her only real complaint is her blurry vision which she probably needs to have checked out by opthalmology. Headaches are much decreased. Her fatigue, depression have eased off and she says she is sleeping better. Her baby has been doing fine and was taking retrovir until March 24th. I did get an authorization for release of information from her to get his health info after his 16 week checkup for [facility]. She will return tomorrow to get the final 24 hr PK blood draw.

## 2014-05-16 ENCOUNTER — Ambulatory Visit (INDEPENDENT_AMBULATORY_CARE_PROVIDER_SITE_OTHER): Payer: Medicaid Other | Admitting: *Deleted

## 2014-05-16 DIAGNOSIS — Z006 Encounter for examination for normal comparison and control in clinical research program: Secondary | ICD-10-CM

## 2014-05-16 NOTE — Progress Notes (Signed)
Catherine AbbeyMarkita is here to get her last PK draw for the 24 hr timeframe. It was drawn at 7:33. She denies any new problems and has not taken her ARVS this morning.

## 2014-05-28 ENCOUNTER — Encounter: Payer: Self-pay | Admitting: Infectious Disease

## 2014-05-28 LAB — HIV-1 RNA QUANT-NO REFLEX-BLD

## 2014-05-31 ENCOUNTER — Ambulatory Visit: Payer: Medicaid Other | Admitting: Advanced Practice Midwife

## 2014-06-21 ENCOUNTER — Ambulatory Visit: Payer: Medicaid Other

## 2014-06-22 ENCOUNTER — Ambulatory Visit: Payer: Medicaid Other | Admitting: Family Medicine

## 2014-07-02 ENCOUNTER — Encounter: Payer: Self-pay | Admitting: Infectious Disease

## 2014-10-05 ENCOUNTER — Encounter (HOSPITAL_COMMUNITY): Payer: Self-pay | Admitting: Emergency Medicine

## 2014-10-05 DIAGNOSIS — Z21 Asymptomatic human immunodeficiency virus [HIV] infection status: Secondary | ICD-10-CM | POA: Diagnosis not present

## 2014-10-05 DIAGNOSIS — I10 Essential (primary) hypertension: Secondary | ICD-10-CM | POA: Insufficient documentation

## 2014-10-05 DIAGNOSIS — Z87891 Personal history of nicotine dependence: Secondary | ICD-10-CM | POA: Diagnosis not present

## 2014-10-05 DIAGNOSIS — K611 Rectal abscess: Secondary | ICD-10-CM | POA: Diagnosis present

## 2014-10-05 DIAGNOSIS — Z79899 Other long term (current) drug therapy: Secondary | ICD-10-CM | POA: Insufficient documentation

## 2014-10-05 DIAGNOSIS — Z862 Personal history of diseases of the blood and blood-forming organs and certain disorders involving the immune mechanism: Secondary | ICD-10-CM | POA: Insufficient documentation

## 2014-10-05 NOTE — ED Notes (Signed)
Pt. reports abscess at right buttocks with no drainage onset yesterday day , denies fever or chills .

## 2014-10-06 ENCOUNTER — Emergency Department (HOSPITAL_COMMUNITY)
Admission: EM | Admit: 2014-10-06 | Discharge: 2014-10-06 | Disposition: A | Discharge status: Home / Self Care | Payer: Medicaid Other | Attending: Emergency Medicine | Admitting: Emergency Medicine

## 2014-10-06 DIAGNOSIS — K611 Rectal abscess: Secondary | ICD-10-CM

## 2014-10-06 MED ORDER — AMOXICILLIN-POT CLAVULANATE 875-125 MG PO TABS: 1.0000 | ORAL_TABLET | Freq: Two times a day (BID) | ORAL | Status: DC

## 2014-10-06 MED ORDER — IBUPROFEN 800 MG PO TABS
800.0000 mg | ORAL_TABLET | Freq: Once | ORAL | Status: AC
Start: 2014-10-06 — End: 2014-10-06
  Administered 2014-10-06: 800 mg via ORAL
  Filled 2014-10-06: qty 1

## 2014-10-06 MED ORDER — LIDOCAINE-EPINEPHRINE 1 %-1:100000 IJ SOLN
10.0000 mL | Freq: Once | INTRAMUSCULAR | Status: AC
  Administered 2014-10-06: 10 mL via INTRADERMAL
  Filled 2014-10-06: qty 1

## 2014-10-06 MED ORDER — METOCLOPRAMIDE HCL 10 MG PO TABS
10.0000 mg | ORAL_TABLET | Freq: Once | ORAL | Status: AC
  Administered 2014-10-06: 10 mg via ORAL
  Filled 2014-10-06: qty 1

## 2014-10-06 MED ORDER — AMOXICILLIN-POT CLAVULANATE 875-125 MG PO TABS
1.0000 | ORAL_TABLET | Freq: Once | ORAL | Status: AC
  Administered 2014-10-06: 1 via ORAL
  Filled 2014-10-06: qty 1

## 2014-10-06 NOTE — Discharge Instructions (Signed)
°  Peri-Rectal Abscess Ms. Becraft, your abscess was opened and will drain.  Take antibiotics as prescribed for 1 week for treatment.  See a primary care doctor within 3 days for close follow up. If symptoms worsen, come back to the ED immediately.  Thank you. Your caregiver has diagnosed you as having a peri-rectal abscess. This is an infected area near the rectum that is filled with pus. If the abscess is near the surface of the skin, your caregiver may open (incise) the area and drain the pus. HOME CARE INSTRUCTIONS   If your abscess was opened up and drained. A small piece of gauze may be placed in the opening so that it can drain. Do not remove the gauze unless directed by your caregiver.  A loose dressing may be placed over the abscess site. Change the dressing as often as necessary to keep it clean and dry.  After the drain is removed, the area may be washed with a gentle antiseptic (soap) four times per day.  A warm sitz bath, warm packs or heating pad may be used for pain relief, taking care not to burn yourself.  Return for a wound check in 1 day or as directed.  An "inflatable doughnut" may be used for sitting with added comfort. These can be purchased at a drugstore or medical supply house.  To reduce pain and straining with bowel movements, eat a high fiber diet with plenty of fruits and vegetables. Use stool softeners as recommended by your caregiver. This is especially important if narcotic type pain medications were prescribed as these may cause marked constipation.  Only take over-the-counter or prescription medicines for pain, discomfort, or fever as directed by your caregiver. SEEK IMMEDIATE MEDICAL CARE IF:   You have increasing pain that is not controlled by medication.  There is increased inflammation (redness), swelling, bleeding, or drainage from the area.  An oral temperature above 102 F (38.9 C) develops.  You develop chills or generalized malaise (feel  lethargic or feel "washed out").  You develop any new symptoms (problems) you feel may be related to your present problem. Document Released: 01/24/2000 Document Revised: 04/20/2011 Document Reviewed: 01/24/2008 [facility] Patient Information 2015 [place], [place]. This information is not intended to replace advice given to you by your health care provider. Make sure you discuss any questions you have with your health care provider.

## 2014-10-06 NOTE — ED Notes (Signed)
Dr. Oni at bedside. 

## 2014-10-06 NOTE — ED Provider Notes (Signed)
CSN: [id number]     Arrival date & time 10/05/14  2215 History  This chart was scribed for Tomasita Crumble, MD by Phillis Haggis, ED Scribe. This patient was seen in room A06C/A06C and patient care was started at 3:13 AM.   Chief Complaint  Patient presents with  . Abscess   The history is provided by the patient. No language interpreter was used.   HPI Comments: Catherine Simmons is a 29 y.o. female with hx of HIV, sickle cell trait, and HTN who presents to the Emergency Department complaining of abscess to the right buttocks onset three days ago. Pt has been using warm rag to no relief. She states that she has had abscesses in the past but not this severe. She states that she has other abscesses but they are not painful. Denies drainage of area.   Past Medical History  Diagnosis Date  . Cholecystitis 07/16/10  . Hypertension   . HIV (human immunodeficiency virus infection)   . Genital warts 2004  . Sickle cell trait    Past Surgical History  Procedure Laterality Date  . Cholecystectomy    . Wisdom tooth extraction     Family History  Problem Relation Age of Onset  . Cancer Mother   . Diabetes Maternal Aunt    Social History  Substance Use Topics  . Smoking status: Former Smoker    Start date: 03/11/2012    Quit date: 08/24/2012  . Smokeless tobacco: Never Used  . Alcohol Use: No   OB History    Gravida Para Term Preterm AB TAB SAB Ectopic Multiple Living   3 3 3       0 3     Review of Systems 10 Systems reviewed and all are negative for acute change except as noted in the HPI.  Allergies  Stadol  Home Medications   Prior to Admission medications   Medication Sig Start Date End Date Taking? Authorizing Provider  amLODipine (NORVASC) 10 MG tablet Take 1 tablet (10 mg total) by mouth daily. 04/10/14  Yes Judyann Munson, MD  darunavir-cobicistat (PREZCOBIX) 800-150 MG per tablet Take 1 tablet by mouth daily. Swallow whole. Do NOT crush, break or chew tablets. Take with  food. 09/05/13  Yes Judyann Munson, MD  emtricitabine-tenofovir (TRUVADA) 200-300 MG per tablet Take 1 tablet by mouth daily. 05/15/14  Yes Judyann Munson, MD  darunavir-cobicistat (PREZCOBIX) 800-150 MG per tablet Take 1 tablet by mouth daily. Swallow whole. Do NOT crush, break or chew tablets. Take with food. Patient not taking: Reported on 10/06/2014 05/15/14   Judyann Munson, MD  hydrochlorothiazide (HYDRODIURIL) 25 MG tablet Take 1 tablet (25 mg total) by mouth daily. Patient not taking: Reported on 10/06/2014 04/10/14   Judyann Munson, MD  hydrocortisone-pramoxine (PROCTOFOAM [facility]) rectal foam Place 1 applicator rectally 2 (two) times daily. Patient not taking: Reported on 10/06/2014 03/29/14   Rhona Raider Stinson, DO  lisinopril (PRINIVIL,ZESTRIL) 20 MG tablet Take 1 tablet (20 mg total) by mouth daily. Patient not taking: Reported on 10/06/2014 04/11/14   Judyann Munson, MD  ondansetron (ZOFRAN-ODT) 8 MG disintegrating tablet DISSOLVE ONE TABLET BY MOUTH EVERY 8 HOURS AS NEEDED FOR NAUSEA OR VOMITING Patient not taking: Reported on 03/29/2014 03/08/14   Judyann Munson, MD   BP 137/73 mmHg  Pulse 59  Temp(Src) 98.1 F (36.7 C) (Oral)  Resp 18  SpO2 99%  LMP 09/15/2014  Physical Exam  Constitutional: She is oriented to person, place, and time. She appears well-developed and well-nourished.  No distress.  HENT:  Head: Normocephalic and atraumatic.  Nose: Nose normal.  Mouth/Throat: Oropharynx is clear and moist. No oropharyngeal exudate.  Eyes: Conjunctivae and EOM are normal. Pupils are equal, round, and reactive to light. No scleral icterus.  Neck: Normal range of motion. Neck supple. No JVD present. No tracheal deviation present. No thyromegaly present.  Cardiovascular: Normal rate, regular rhythm and normal heart sounds.  Exam reveals no gallop and no friction rub.   No murmur heard. Pulmonary/Chest: Effort normal and breath sounds normal. No respiratory distress. She has no wheezes. She exhibits no  tenderness.  Abdominal: Soft. Bowel sounds are normal. She exhibits no distension and no mass. There is no tenderness. There is no rebound and no guarding.  Musculoskeletal: Normal range of motion. She exhibits no edema or tenderness.  Lymphadenopathy:    She has no cervical adenopathy.  Neurological: She is alert and oriented to person, place, and time. No cranial nerve deficit. She exhibits normal muscle tone.  Skin: Skin is warm and dry. No rash noted. No erythema. No pallor.  3 cm circumferential abscess to right medial gluteal fold with fluctuance, tenderness to palpation  Nursing note and vitals reviewed.   ED Course  Procedures (including critical care time) DIAGNOSTIC STUDIES: Oxygen Saturation is 99% on RA, normal by my interpretation.    COORDINATION OF CARE: 3:16 AM-Discussed treatment plan which includes I&D with pt at bedside and pt agreed to plan.   Labs Review Labs Reviewed - No data to display  Imaging Review No results found.    EKG Interpretation None      MDM   Final diagnoses:  None   Patient presents to the ED for abscess on the buttock.  I&D performed.  Will give abx because patient has h/o abscess, h/o HIV, and location is adjacent to rectum.  Will give augmentin x 7 days.  Encourage PCP fu within 3 days for wound check.  VS stable, she appears well and in NAD.  She is safe for DC.   INCISION AND DRAINAGE Performed by: Tomasita Crumble Consent: Verbal consent obtained. Risks and benefits: risks, benefits and alternatives were discussed Type: abscess  Body area: R gluteal fold  Anesthesia: local infiltration  Incision was made with a scalpel.  Local anesthetic: lidocaine 1% with epinephrine  Anesthetic total: 5 ml  Complexity: complex Blunt dissection to break up loculations  Drainage: purulent  Drainage amount: 10cc  Patient tolerance: Patient tolerated the procedure well with no immediate complications.      I personally performed  the services described in this documentation, which was scribed in my presence. The recorded information has been reviewed and is accurate.   Tomasita Crumble, MD 10/06/14 [phone]

## 2014-10-06 NOTE — ED Notes (Signed)
Pt has an abscess to right buttox, the area is red, swollen, hot to the touch and tender. The pt also has an abscess to the left buttox, this area is swollen. Pt denies any drainage from either site.   Pt also has an abscess to the left side of her groin. The pt states that it has previously drained, drainage was green.

## 2014-10-23 ENCOUNTER — Ambulatory Visit: Payer: Medicaid Other | Admitting: Family Medicine

## 2014-10-25 ENCOUNTER — Ambulatory Visit: Payer: Medicaid Other | Admitting: Family Medicine

## 2014-11-01 ENCOUNTER — Other Ambulatory Visit: Payer: Medicaid Other

## 2014-11-01 DIAGNOSIS — Z21 Asymptomatic human immunodeficiency virus [HIV] infection status: Secondary | ICD-10-CM

## 2014-11-01 LAB — COMPREHENSIVE METABOLIC PANEL
ALK PHOS: 67 U/L (ref 33–115)
ALT: 19 U/L (ref 6–29)
AST: 12 U/L (ref 10–30)
Albumin: 3.6 g/dL (ref 3.6–5.1)
BILIRUBIN TOTAL: 0.3 mg/dL (ref 0.2–1.2)
BUN: 11 mg/dL (ref 7–25)
CALCIUM: 8.9 mg/dL (ref 8.6–10.2)
CO2: 27 mmol/L (ref 20–31)
Chloride: 106 mmol/L (ref 98–110)
Creat: 0.7 mg/dL (ref 0.50–1.10)
GLUCOSE: 75 mg/dL (ref 65–99)
POTASSIUM: 4.5 mmol/L (ref 3.5–5.3)
Sodium: 140 mmol/L (ref 135–146)
TOTAL PROTEIN: 6.4 g/dL (ref 6.1–8.1)

## 2014-11-01 LAB — CBC WITH DIFFERENTIAL/PLATELET
BASOS ABS: 0 10*3/uL (ref 0.0–0.1)
Basophils Relative: 0 % (ref 0–1)
Eosinophils Absolute: 0.1 10*3/uL (ref 0.0–0.7)
Eosinophils Relative: 2 % (ref 0–5)
HEMATOCRIT: 33.8 % — AB (ref 36.0–46.0)
Hemoglobin: 10.6 g/dL — ABNORMAL LOW (ref 12.0–15.0)
LYMPHS ABS: 2.6 10*3/uL (ref 0.7–4.0)
LYMPHS PCT: 40 % (ref 12–46)
MCH: 23.8 pg — ABNORMAL LOW (ref 26.0–34.0)
MCHC: 31.4 g/dL (ref 30.0–36.0)
MCV: 76 fL — AB (ref 78.0–100.0)
MPV: 9.6 fL (ref 8.6–12.4)
Monocytes Absolute: 0.5 10*3/uL (ref 0.1–1.0)
Monocytes Relative: 8 % (ref 3–12)
NEUTROS ABS: 3.3 10*3/uL (ref 1.7–7.7)
NEUTROS PCT: 50 % (ref 43–77)
Platelets: 248 10*3/uL (ref 150–400)
RBC: 4.45 MIL/uL (ref 3.87–5.11)
RDW: 16.7 % — ABNORMAL HIGH (ref 11.5–15.5)
WBC: 6.5 10*3/uL (ref 4.0–10.5)

## 2014-11-02 LAB — T-HELPER CELL (CD4) - (RCID CLINIC ONLY)
CD4 % Helper T Cell: 38 % (ref 33–55)
CD4 T Cell Abs: 1000 /uL (ref 400–2700)

## 2014-11-04 LAB — HIV-1 RNA QUANT-NO REFLEX-BLD

## 2014-11-08 ENCOUNTER — Emergency Department (HOSPITAL_COMMUNITY)
Admission: EM | Admit: 2014-11-08 | Discharge: 2014-11-08 | Disposition: A | Discharge status: Home / Self Care | Payer: Medicaid Other | Attending: Emergency Medicine | Admitting: Emergency Medicine

## 2014-11-08 ENCOUNTER — Encounter (HOSPITAL_COMMUNITY): Payer: Self-pay

## 2014-11-08 DIAGNOSIS — R112 Nausea with vomiting, unspecified: Secondary | ICD-10-CM

## 2014-11-08 DIAGNOSIS — B20 Human immunodeficiency virus [HIV] disease: Secondary | ICD-10-CM | POA: Diagnosis not present

## 2014-11-08 DIAGNOSIS — I10 Essential (primary) hypertension: Secondary | ICD-10-CM | POA: Insufficient documentation

## 2014-11-08 DIAGNOSIS — Z79899 Other long term (current) drug therapy: Secondary | ICD-10-CM | POA: Diagnosis not present

## 2014-11-08 DIAGNOSIS — Z862 Personal history of diseases of the blood and blood-forming organs and certain disorders involving the immune mechanism: Secondary | ICD-10-CM | POA: Insufficient documentation

## 2014-11-08 DIAGNOSIS — Z87891 Personal history of nicotine dependence: Secondary | ICD-10-CM | POA: Diagnosis not present

## 2014-11-08 DIAGNOSIS — R1013 Epigastric pain: Secondary | ICD-10-CM | POA: Diagnosis not present

## 2014-11-08 DIAGNOSIS — Z331 Pregnant state, incidental: Secondary | ICD-10-CM | POA: Diagnosis not present

## 2014-11-08 DIAGNOSIS — R0602 Shortness of breath: Secondary | ICD-10-CM | POA: Diagnosis not present

## 2014-11-08 DIAGNOSIS — Z3490 Encounter for supervision of normal pregnancy, unspecified, unspecified trimester: Secondary | ICD-10-CM

## 2014-11-08 DIAGNOSIS — Z8719 Personal history of other diseases of the digestive system: Secondary | ICD-10-CM | POA: Insufficient documentation

## 2014-11-08 LAB — COMPREHENSIVE METABOLIC PANEL
ALT: 20 U/L (ref 14–54)
AST: 19 U/L (ref 15–41)
Albumin: 3.7 g/dL (ref 3.5–5.0)
Alkaline Phosphatase: 65 U/L (ref 38–126)
Anion gap: 6 (ref 5–15)
BILIRUBIN TOTAL: 0.3 mg/dL (ref 0.3–1.2)
BUN: 10 mg/dL (ref 6–20)
CHLORIDE: 108 mmol/L (ref 101–111)
CO2: 24 mmol/L (ref 22–32)
CREATININE: 0.58 mg/dL (ref 0.44–1.00)
Calcium: 8.7 mg/dL — ABNORMAL LOW (ref 8.9–10.3)
GFR calc Af Amer: >60 mL/min (ref >60)
Glucose, Bld: 111 mg/dL — ABNORMAL HIGH (ref 65–99)
Potassium: 3.2 mmol/L — ABNORMAL LOW (ref 3.5–5.1)
Sodium: 138 mmol/L (ref 135–145)
Total Protein: 7 g/dL (ref 6.5–8.1)

## 2014-11-08 LAB — CBC WITH DIFFERENTIAL/PLATELET
BASOS ABS: 0 10*3/uL (ref 0.0–0.1)
Basophils Relative: 0 %
EOS PCT: 2 %
Eosinophils Absolute: 0.1 10*3/uL (ref 0.0–0.7)
HCT: 32.7 % — ABNORMAL LOW (ref 36.0–46.0)
Hemoglobin: 10.6 g/dL — ABNORMAL LOW (ref 12.0–15.0)
LYMPHS PCT: 33 %
Lymphs Abs: 2.2 10*3/uL (ref 0.7–4.0)
MCH: 24.2 pg — AB (ref 26.0–34.0)
MCHC: 32.4 g/dL (ref 30.0–36.0)
MCV: 74.7 fL — ABNORMAL LOW (ref 78.0–100.0)
MONO ABS: 0.5 10*3/uL (ref 0.1–1.0)
Monocytes Relative: 8 %
Neutro Abs: 3.8 10*3/uL (ref 1.7–7.7)
Neutrophils Relative %: 57 %
Platelets: 219 10*3/uL (ref 150–400)
RBC: 4.38 MIL/uL (ref 3.87–5.11)
RDW: 15.7 % — AB (ref 11.5–15.5)
WBC: 6.7 10*3/uL (ref 4.0–10.5)

## 2014-11-08 LAB — URINALYSIS, ROUTINE W REFLEX MICROSCOPIC
BILIRUBIN URINE: NEGATIVE
GLUCOSE, UA: NEGATIVE mg/dL
HGB URINE DIPSTICK: NEGATIVE
Ketones, ur: NEGATIVE mg/dL
Leukocytes, UA: NEGATIVE
NITRITE: NEGATIVE
PH: 6 (ref 5.0–8.0)
Protein, ur: NEGATIVE mg/dL
Specific Gravity, Urine: 1.019 (ref 1.005–1.030)
Urobilinogen, UA: 1 mg/dL (ref 0.0–1.0)

## 2014-11-08 LAB — LIPASE, BLOOD: LIPASE: 28 U/L (ref 22–51)

## 2014-11-08 LAB — I-STAT BETA HCG BLOOD, ED (MC, WL, AP ONLY): I-stat hCG, quantitative: 34.1 m[IU]/mL — ABNORMAL HIGH (ref <5)

## 2014-11-08 LAB — PREGNANCY, URINE: Preg Test, Ur: POSITIVE — AB

## 2014-11-08 MED ORDER — METOCLOPRAMIDE HCL 5 MG/ML IJ SOLN
10.0000 mg | Freq: Once | INTRAMUSCULAR | Status: AC
  Administered 2014-11-08: 10 mg via INTRAVENOUS
  Filled 2014-11-08: qty 2

## 2014-11-08 MED ORDER — ONDANSETRON HCL 4 MG PO TABS: 4.0000 mg | ORAL_TABLET | Freq: Four times a day (QID) | ORAL | Status: DC

## 2014-11-08 MED ORDER — SODIUM CHLORIDE 0.9 % IV BOLUS (SEPSIS)
500.0000 mL | Freq: Once | INTRAVENOUS | Status: AC
  Administered 2014-11-08: 500 mL via INTRAVENOUS

## 2014-11-08 NOTE — Discharge Instructions (Signed)

## 2014-11-08 NOTE — ED Notes (Signed)
Patient states she ate [place] at 1930, woke up at 2330 with upper mid abd pain and vomiting. Patient states she vomited her food and is now vomiting clear/yellow.

## 2014-11-08 NOTE — ED Provider Notes (Signed)
CSN: [id number]     Arrival date & time 11/08/14  0146 History   First MD Initiated Contact with Patient 11/08/14 0210     Chief Complaint  Patient presents with  . Shortness of Breath  . Emesis     (Consider location/radiation/quality/duration/timing/severity/associated sxs/prior Treatment) Patient is a 29 y.o. female presenting with vomiting. The history is provided by the patient. No language interpreter was used.  Emesis Severity:  Moderate Duration:  2 hours Chronicity:  New Associated symptoms: abdominal pain   Associated symptoms: no chills and no diarrhea   Associated symptoms comment:  Patient presents with vomiting nonbloody emesis for 2 hours. No diarrhea. Her last meal was fish from a fast food restaurant around 7:00 pm yesterday, approximately 5 hours before symptoms started. No known fever. She also complains of low back aching pain since prior to onset of nausea and vomiting, that is persistent now. No dysuria. She reports completing a period last week. She states she may have seen some blood on the tissue after urinating today and is not sure if it was urinary bleeding or vaginal spotting.    Past Medical History  Diagnosis Date  . Cholecystitis 07/16/10  . Hypertension   . HIV (human immunodeficiency virus infection)   . Genital warts 2004  . Sickle cell trait    Past Surgical History  Procedure Laterality Date  . Cholecystectomy    . Wisdom tooth extraction     Family History  Problem Relation Age of Onset  . Cancer Mother   . Diabetes Maternal Aunt    Social History  Substance Use Topics  . Smoking status: Former Smoker    Start date: 03/11/2012    Quit date: 08/24/2012  . Smokeless tobacco: Never Used  . Alcohol Use: No   OB History    Gravida Para Term Preterm AB TAB SAB Ectopic Multiple Living   0 3     Review of Systems  Constitutional: Negative for fever and chills.  Respiratory: Positive for shortness of breath.    Cardiovascular: Negative.   Gastrointestinal: Positive for nausea, vomiting and abdominal pain. Negative for diarrhea.  Genitourinary: Negative for dysuria.  Musculoskeletal: Positive for back pain.  Skin: Negative.   Neurological: Negative.       Allergies  Stadol  Home Medications   Prior to Admission medications   Medication Sig Start Date End Date Taking? Authorizing Provider  acetaminophen (TYLENOL) 500 MG tablet Take 1,000 mg by mouth every 6 (six) hours as needed for mild pain.   Yes Historical Provider, MD  amLODipine (NORVASC) 10 MG tablet Take 1 tablet (10 mg total) by mouth daily. 04/10/14  Yes Judyann Munson, MD  darunavir-cobicistat (PREZCOBIX) 800-150 MG per tablet Take 1 tablet by mouth daily. Swallow whole. Do NOT crush, break or chew tablets. Take with food. 09/05/13  Yes Judyann Munson, MD  emtricitabine-tenofovir (TRUVADA) 200-300 MG per tablet Take 1 tablet by mouth daily. 05/15/14  Yes Judyann Munson, MD  amoxicillin-clavulanate (AUGMENTIN) 875-125 MG per tablet Take 1 tablet by mouth 2 (two) times daily. One po bid x 7 days Patient not taking: Reported on 11/08/2014 10/06/14   Tomasita Crumble, MD  darunavir-cobicistat (PREZCOBIX) 800-150 MG per tablet Take 1 tablet by mouth daily. Swallow whole. Do NOT crush, break or chew tablets. Take with food. Patient not taking: Reported on 10/06/2014 05/15/14   Judyann Munson, MD  hydrochlorothiazide (HYDRODIURIL) 25 MG tablet Take 1 tablet (25 mg  total) by mouth daily. Patient not taking: Reported on 10/06/2014 04/10/14   Judyann Munson, MD  hydrocortisone-pramoxine (PROCTOFOAM [facility]) rectal foam Place 1 applicator rectally 2 (two) times daily. Patient not taking: Reported on 10/06/2014 03/29/14   Rhona Raider Stinson, DO  lisinopril (PRINIVIL,ZESTRIL) 20 MG tablet Take 1 tablet (20 mg total) by mouth daily. Patient not taking: Reported on 10/06/2014 04/11/14   Judyann Munson, MD  ondansetron (ZOFRAN-ODT) 8 MG disintegrating tablet DISSOLVE ONE TABLET  BY MOUTH EVERY 8 HOURS AS NEEDED FOR NAUSEA OR VOMITING Patient not taking: Reported on 03/29/2014 03/08/14   Judyann Munson, MD   BP 153/108 mmHg  Pulse 62  Temp(Src) 97.6 F (36.4 C) (Oral)  Resp 24  LMP 10/12/2014 Physical Exam  Constitutional: She is oriented to person, place, and time. She appears well-developed and well-nourished.  Appears distressed, restless, unable to sit still.  HENT:  Head: Normocephalic.  Neck: Normal range of motion. Neck supple.  Cardiovascular: Normal rate and regular rhythm.   Pulmonary/Chest: Effort normal and breath sounds normal.  Abdominal: Soft. Bowel sounds are normal. There is tenderness. There is no rebound and no guarding.  Epigastric tenderness.   Musculoskeletal: Normal range of motion.  Neurological: She is alert and oriented to person, place, and time.  Skin: Skin is warm and dry. No rash noted.  Psychiatric: She has a normal mood and affect.    ED Course  Procedures (including critical care time) Labs Review Labs Reviewed  COMPREHENSIVE METABOLIC PANEL  LIPASE, BLOOD  CBC WITH DIFFERENTIAL/PLATELET  URINALYSIS, ROUTINE W REFLEX MICROSCOPIC (NOT AT [facility])  PREGNANCY, URINE   Results for orders placed or performed during the hospital encounter of 11/08/14  Comprehensive metabolic panel  Result Value Ref Range   Sodium 138 135 - 145 mmol/L   Potassium 3.2 (L) 3.5 - 5.1 mmol/L   Chloride 108 101 - 111 mmol/L   CO2 24 22 - 32 mmol/L   Glucose, Bld 111 (H) 65 - 99 mg/dL   BUN 10 6 - 20 mg/dL   Creatinine, Ser 1.19 0.44 - 1.00 mg/dL   Calcium 8.7 (L) 8.9 - 10.3 mg/dL   Total Protein 7.0 6.5 - 8.1 g/dL   Albumin 3.7 3.5 - 5.0 g/dL   AST 19 15 - 41 U/L   ALT 20 14 - 54 U/L   Alkaline Phosphatase 65 38 - 126 U/L   Total Bilirubin 0.3 0.3 - 1.2 mg/dL   GFR calc non Af Amer >60 >60 mL/min   GFR calc Af Amer >60 >60 mL/min   Anion gap 6 5 - 15  Lipase, blood  Result Value Ref Range   Lipase 28 22 - 51 U/L  CBC with Differential   Result Value Ref Range   WBC 6.7 4.0 - 10.5 K/uL   RBC 4.38 3.87 - 5.11 MIL/uL   Hemoglobin 10.6 (L) 12.0 - 15.0 g/dL   HCT 14.7 (L) 82.9 - 56.2 %   MCV 74.7 (L) 78.0 - 100.0 fL   MCH 24.2 (L) 26.0 - 34.0 pg   MCHC 32.4 30.0 - 36.0 g/dL   RDW 13.0 (H) 86.5 - 78.4 %   Platelets 219 150 - 400 K/uL   Neutrophils Relative % 57 %   Neutro Abs 3.8 1.7 - 7.7 K/uL   Lymphocytes Relative 33 %   Lymphs Abs 2.2 0.7 - 4.0 K/uL   Monocytes Relative 8 %   Monocytes Absolute 0.5 0.1 - 1.0 K/uL   Eosinophils Relative 2 %  Eosinophils Absolute 0.1 0.0 - 0.7 K/uL   Basophils Relative 0 %   Basophils Absolute 0.0 0.0 - 0.1 K/uL  Urinalysis, Routine w reflex microscopic  Result Value Ref Range   Color, Urine YELLOW YELLOW   APPearance CLOUDY (A) CLEAR   Specific Gravity, Urine 1.019 1.005 - 1.030   pH 6.0 5.0 - 8.0   Glucose, UA NEGATIVE NEGATIVE mg/dL   Hgb urine dipstick NEGATIVE NEGATIVE   Bilirubin Urine NEGATIVE NEGATIVE   Ketones, ur NEGATIVE NEGATIVE mg/dL   Protein, ur NEGATIVE NEGATIVE mg/dL   Urobilinogen, UA 1.0 0.0 - 1.0 mg/dL   Nitrite NEGATIVE NEGATIVE   Leukocytes, UA NEGATIVE NEGATIVE  Pregnancy, urine  Result Value Ref Range   Preg Test, Ur POSITIVE (A) NEGATIVE  I-Stat beta hCG blood, ED  Result Value Ref Range   I-stat hCG, quantitative 34.1 (H) <5 mIU/mL   Comment 3             Imaging Review No results found. I have personally reviewed and evaluated these images and lab results as part of my medical decision-making.   EKG Interpretation None      MDM   Final diagnoses:  None    1. Abdominal pain, epigastric 2. Nausea and vomiting 3. Pregnant  Symptoms of epigastric pain and nausea are resolved with Reglan. She continues to complain of low back pain.  Discussed positive pregnancy test. Considered symptoms of low back pain and possible vaginal spotting in the setting of positive pregnancy. Quant ordered and is low at 34. [place] not thought to be able to  provide any beneficial information and was cancelled. The patient will need to follow up with PCP or GYN for repeat quant HCG in 2 days.    Elpidio Anis, PA-C 11/08/14 1610  Loren Racer, MD 11/08/14 [phone]

## 2014-11-08 NOTE — ED Notes (Signed)
PA at bedside.

## 2014-11-08 NOTE — ED Notes (Signed)
Pt complains of vomiting and being short of breath for about two hours

## 2014-11-08 NOTE — ED Notes (Signed)
Patient updated we need a urine sample as soon as she is able to provide one.

## 2014-11-15 ENCOUNTER — Inpatient Hospital Stay (HOSPITAL_COMMUNITY): Payer: Medicaid Other

## 2014-11-15 ENCOUNTER — Encounter (HOSPITAL_COMMUNITY): Payer: Self-pay

## 2014-11-15 ENCOUNTER — Inpatient Hospital Stay (HOSPITAL_COMMUNITY)
Admission: AD | Admit: 2014-11-15 | Discharge: 2014-11-15 | Disposition: A | Discharge status: Home / Self Care | Payer: Medicaid Other | Source: Ambulatory Visit | Attending: Family Medicine | Admitting: Family Medicine

## 2014-11-15 DIAGNOSIS — B20 Human immunodeficiency virus [HIV] disease: Secondary | ICD-10-CM | POA: Diagnosis not present

## 2014-11-15 DIAGNOSIS — Z3A01 Less than 8 weeks gestation of pregnancy: Secondary | ICD-10-CM | POA: Diagnosis not present

## 2014-11-15 DIAGNOSIS — O26891 Other specified pregnancy related conditions, first trimester: Secondary | ICD-10-CM | POA: Diagnosis not present

## 2014-11-15 DIAGNOSIS — R51 Headache: Secondary | ICD-10-CM | POA: Diagnosis present

## 2014-11-15 DIAGNOSIS — R102 Pelvic and perineal pain: Secondary | ICD-10-CM | POA: Insufficient documentation

## 2014-11-15 DIAGNOSIS — O3680X0 Pregnancy with inconclusive fetal viability, not applicable or unspecified: Secondary | ICD-10-CM

## 2014-11-15 LAB — HCG, QUANTITATIVE, PREGNANCY: hCG, Beta Chain, Quant, S: 608 m[IU]/mL — ABNORMAL HIGH

## 2014-11-15 LAB — CBC
HCT: 30.9 % — ABNORMAL LOW (ref 36.0–46.0)
Hemoglobin: 9.9 g/dL — ABNORMAL LOW (ref 12.0–15.0)
MCH: 24.1 pg — ABNORMAL LOW (ref 26.0–34.0)
MCHC: 32 g/dL (ref 30.0–36.0)
MCV: 75.4 fL — ABNORMAL LOW (ref 78.0–100.0)
Platelets: 228 K/uL (ref 150–400)
RBC: 4.1 MIL/uL (ref 3.87–5.11)
RDW: 15.9 % — ABNORMAL HIGH (ref 11.5–15.5)
WBC: 6.7 K/uL (ref 4.0–10.5)

## 2014-11-15 LAB — URINALYSIS, ROUTINE W REFLEX MICROSCOPIC
Bilirubin Urine: NEGATIVE
Glucose, UA: NEGATIVE mg/dL
Hgb urine dipstick: NEGATIVE
Ketones, ur: 15 mg/dL — AB
Leukocytes, UA: NEGATIVE
Nitrite: NEGATIVE
Protein, ur: NEGATIVE mg/dL
Specific Gravity, Urine: <1.005 — ABNORMAL LOW (ref 1.005–1.030)
Urobilinogen, UA: 0.2 mg/dL (ref 0.0–1.0)
pH: 5.5 (ref 5.0–8.0)

## 2014-11-15 LAB — WET PREP, GENITAL
TRICH WET PREP: NONE SEEN
WBC, Wet Prep HPF POC: NONE SEEN
Yeast Wet Prep HPF POC: NONE SEEN

## 2014-11-15 IMAGING — US US OB TRANSVAGINAL
1 series · 15 of 28 positions shown · non-contrast
Comparison: None.

CLINICAL DATA: Pelvic pain predominately on the right for 1 week

EXAM:
OBSTETRIC <14 WK US AND TRANSVAGINAL OB US
TECHNIQUE: Both transabdominal and transvaginal ultrasound examinations were
performed for complete evaluation of the gestation as well as the
maternal uterus, adnexal regions, and pelvic cul-de-sac.
Transvaginal technique was performed to assess early pregnancy.

[Series 1: us ob transvaginal · 15 of 64 slices shown]
[im 1/64]
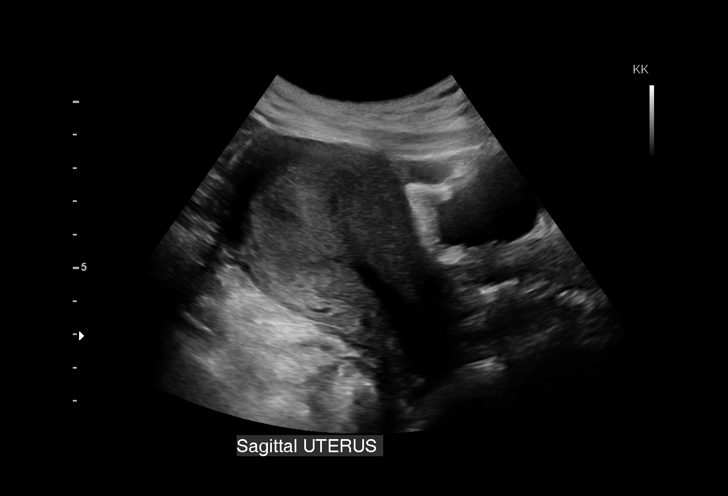
[im 5/64]
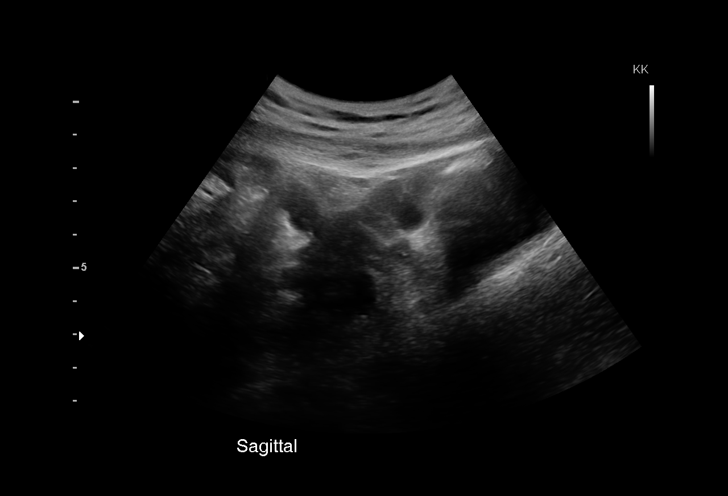
[im 10/64]
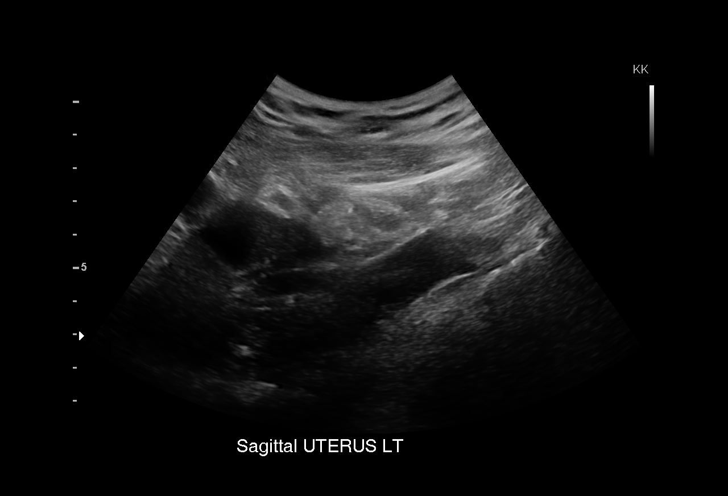
[im 15/64]
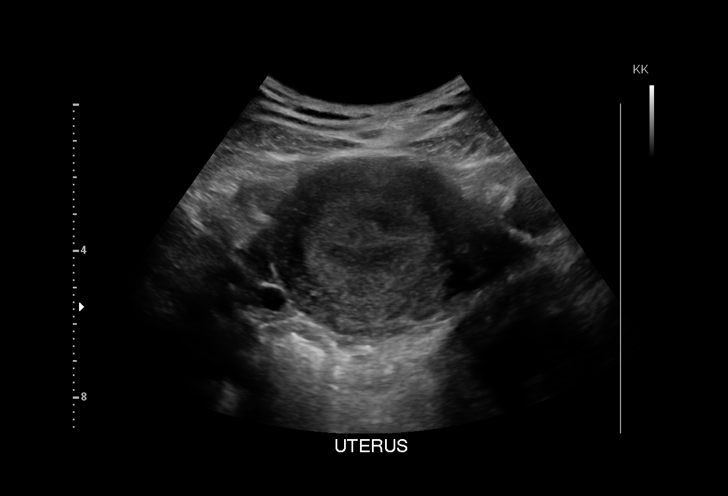
[im 19/64]
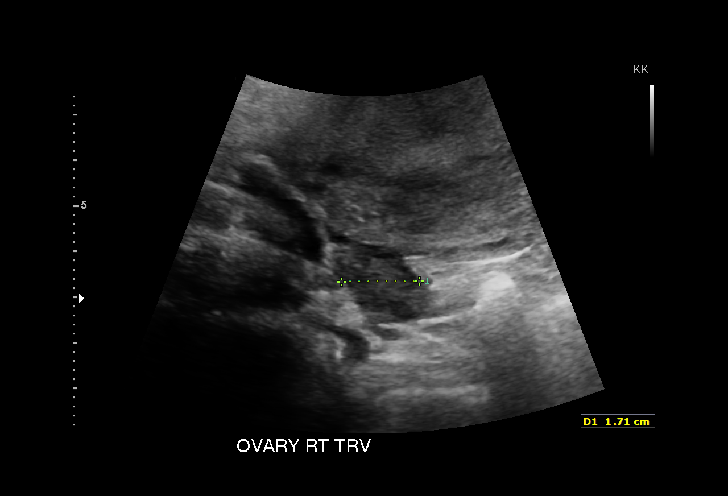
[im 24/64]
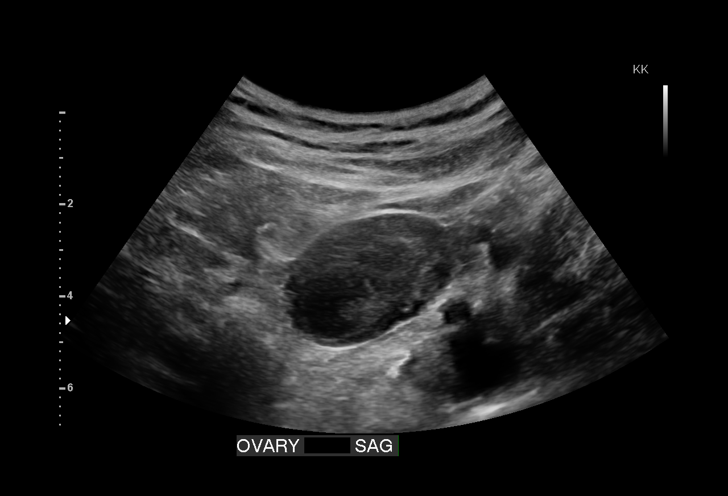
[im 29/64]
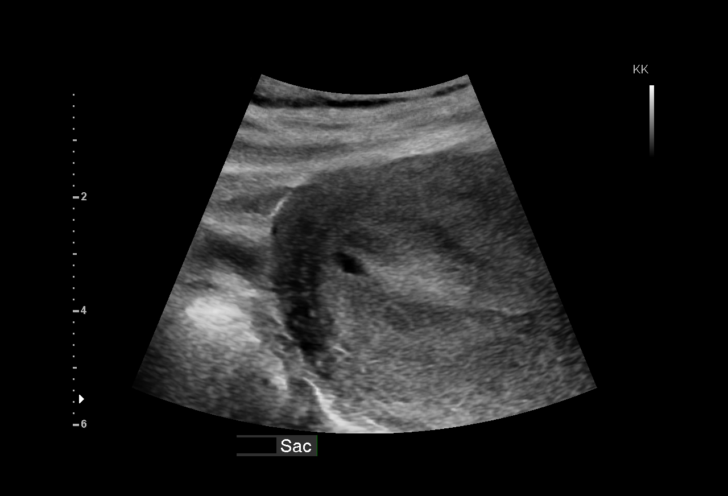
[im 33/64]
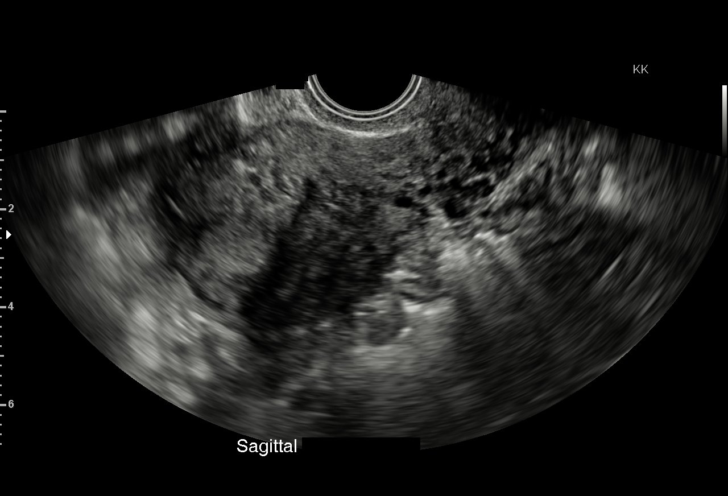
[im 36/64]
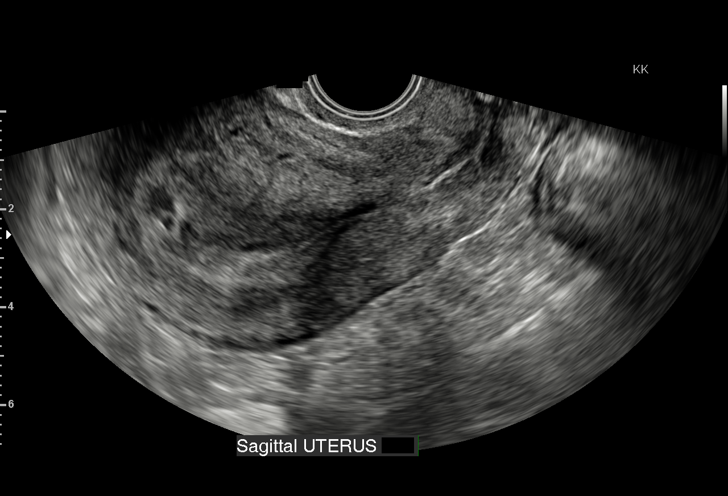
[im 40/64]
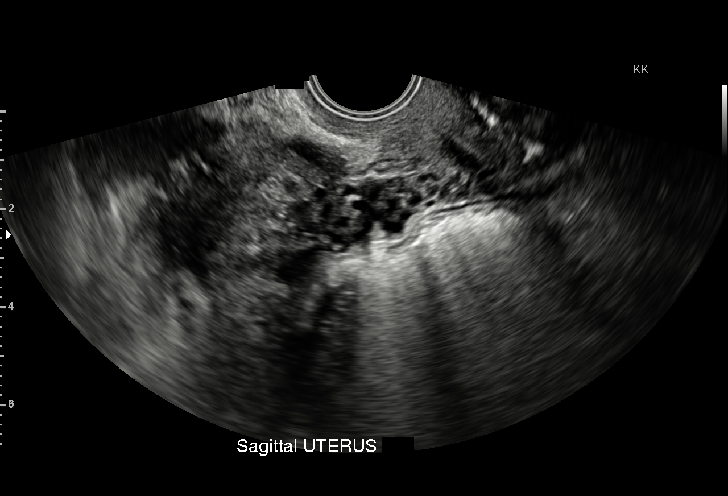
[im 45/64]
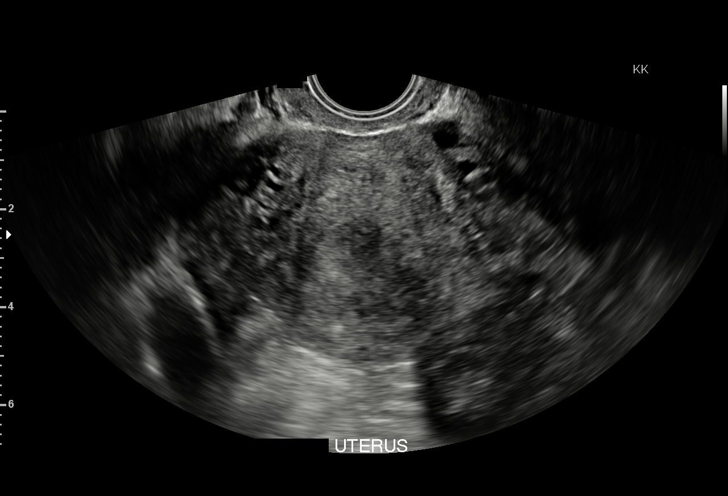
[im 50/64]
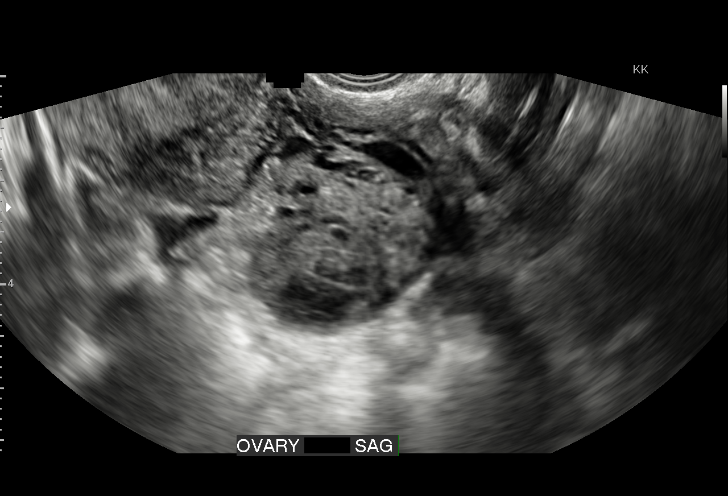
[im 54/64]
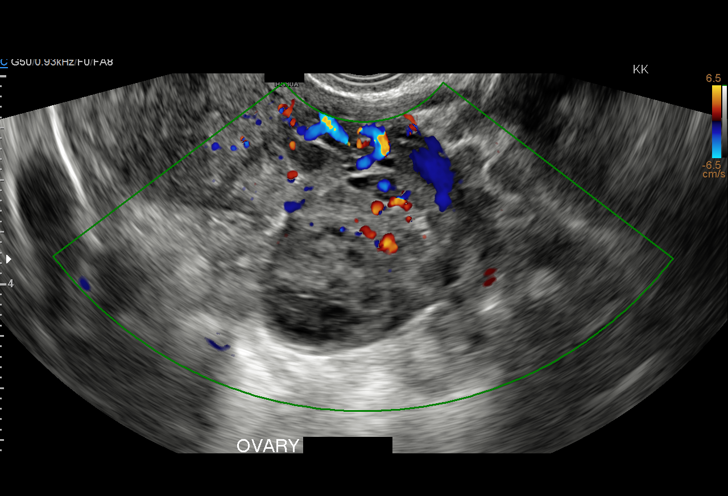
[im 59/64]
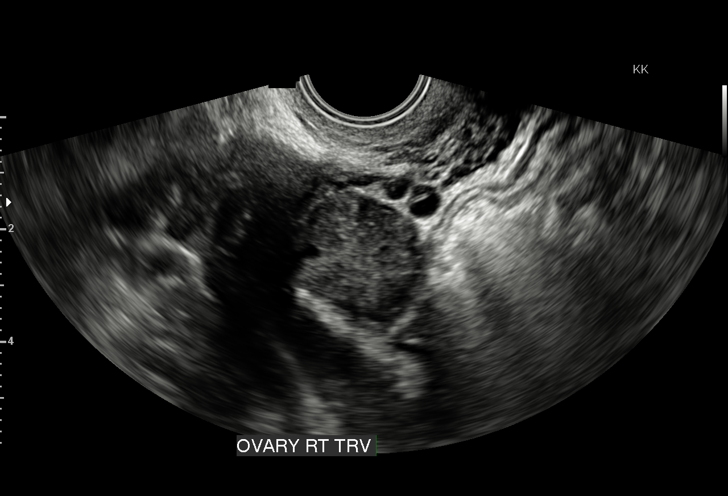
[im 64/64]
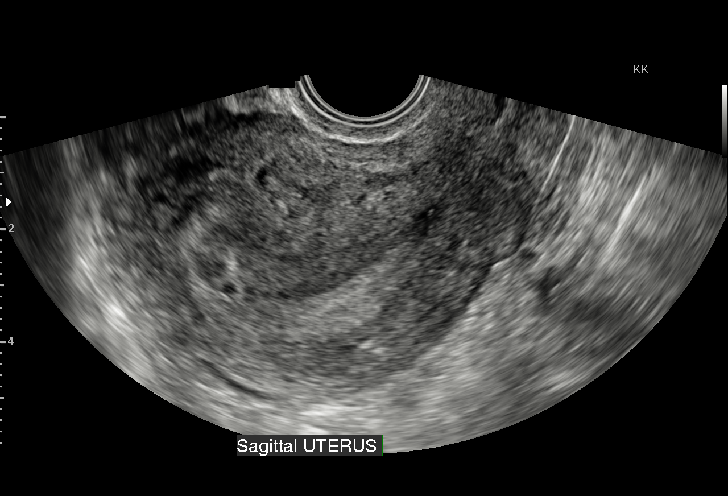

[15 of 28 positions shown; findings below may reference images not displayed]

FINDINGS: Intrauterine gestational sac: Not well seen. A tiny hypoechoic
structure is noted which may represent a very early gestational sac.
This would correspond with the patient's given clinical dates of 4
weeks 6 days.

Yolk sac:  Not visualized

Embryo:  Not visualized

Cardiac Activity: Not visualized

Maternal uterus/adnexae: The uterus and ovaries are within normal
limits. No free fluid is seen.
IMPRESSION: No acute abnormality is noted. Questionable gestational sac as
described. Followup examination as clinically necessary.

## 2014-11-15 MED ORDER — ACETAMINOPHEN 500 MG PO TABS
1000.0000 mg | ORAL_TABLET | Freq: Once | ORAL | Status: AC
  Administered 2014-11-15: 1000 mg via ORAL
  Filled 2014-11-15: qty 2

## 2014-11-15 MED ORDER — PROMETHAZINE HCL 25 MG PO TABS: 12.5000 mg | ORAL_TABLET | Freq: Four times a day (QID) | ORAL | Status: DC | PRN

## 2014-11-15 NOTE — Discharge Instructions (Signed)

## 2014-11-15 NOTE — MAU Note (Signed)
Pt c/o lower abdominal pain and headache x1 week. Has not taken anything today for pain, but has taken extra strength tylenol yesterday, which did not help. Headache is 8/10. RLQ pain 9/10. Denies vaginal bleeding. LMP: 10/12/2014.

## 2014-11-15 NOTE — MAU Provider Note (Signed)
Chief Complaint: Headache and Abdominal Pain   None     SUBJECTIVE HPI: Catherine Simmons is a 29 y.o. G4P3003 at [redacted]w[redacted]d by LMP who presents to maternity admissions reporting RLQ abdominal pain and h/a with positive pregnancy test at [facility] on 11/06/14.  She reports abdominal pain and h/a started in last 2-3 days and are intermittent.  She has hx of HIV infection and is on antiviral medications.  She reports she does not eat and drink regularly. She denies vaginal bleeding, vaginal itching/burning, urinary symptoms, dizziness, n/v, or fever/chills.     Headache  This is a new problem. The current episode started in the past 7 days. The problem occurs intermittently. The problem has been unchanged. The pain is located in the frontal region. The pain does not radiate. The pain quality is similar to prior headaches. The quality of the pain is described as dull. The pain is moderate. Associated symptoms include abdominal pain and nausea. Pertinent negatives include no dizziness, fever, neck pain, photophobia, sinus pressure, visual change, vomiting or weakness. Nothing aggravates the symptoms. She has tried nothing for the symptoms.  Abdominal Pain This is a new problem. The current episode started in the past 7 days. The onset quality is gradual. The problem occurs intermittently. The problem has been waxing and waning. The pain is located in the RLQ. The pain is moderate. The quality of the pain is cramping. The abdominal pain radiates to the RLQ. Associated symptoms include headaches and nausea. Pertinent negatives include no constipation, diarrhea, dysuria, fever, frequency or vomiting. Nothing aggravates the pain. The pain is relieved by nothing. She has tried nothing for the symptoms.    Past Medical History  Diagnosis Date  . Cholecystitis 07/16/10  . Hypertension   . HIV (human immunodeficiency virus infection) (HCC)   . Genital warts 2004  . Sickle cell trait [facility])    Past Surgical History   Procedure Laterality Date  . Cholecystectomy    . Wisdom tooth extraction     Social History   Social History  . Marital Status: Single    Spouse Name: N/A  . Number of Children: N/A  . Years of Education: N/A   Occupational History  . Not on file.   Social History Main Topics  . Smoking status: Former Smoker    Start date: 03/11/2012    Quit date: 08/24/2012  . Smokeless tobacco: Never Used  . Alcohol Use: No  . Drug Use: No  . Sexual Activity:    Partners: Male    Birth Control/ Protection: None   Other Topics Concern  . Not on file   Social History Narrative   No current facility-administered medications on file prior to encounter.   Current Outpatient Prescriptions on File Prior to Encounter  Medication Sig Dispense Refill  . acetaminophen (TYLENOL) 500 MG tablet Take 1,000 mg by mouth every 6 (six) hours as needed for mild pain.    Marland Kitchen amLODipine (NORVASC) 10 MG tablet Take 1 tablet (10 mg total) by mouth daily. 30 tablet 11  . amoxicillin-clavulanate (AUGMENTIN) 875-125 MG per tablet Take 1 tablet by mouth 2 (two) times daily. One po bid x 7 days (Patient not taking: Reported on 11/08/2014) 14 tablet 0  . darunavir-cobicistat (PREZCOBIX) 800-150 MG per tablet Take 1 tablet by mouth daily. Swallow whole. Do NOT crush, break or chew tablets. Take with food. 30 tablet 11  . darunavir-cobicistat (PREZCOBIX) 800-150 MG per tablet Take 1 tablet by mouth daily. Swallow whole.  Do NOT crush, break or chew tablets. Take with food. (Patient not taking: Reported on 10/06/2014) 30 tablet 11  . emtricitabine-tenofovir (TRUVADA) 200-300 MG per tablet Take 1 tablet by mouth daily. 30 tablet 11  . hydrochlorothiazide (HYDRODIURIL) 25 MG tablet Take 1 tablet (25 mg total) by mouth daily. (Patient not taking: Reported on 10/06/2014) 30 tablet 11  . hydrocortisone-pramoxine (PROCTOFOAM HC) rectal foam Place 1 applicator rectally 2 (two) times daily. (Patient not taking: Reported on  10/06/2014) 10 g 2  . lisinopril (PRINIVIL,ZESTRIL) 20 MG tablet Take 1 tablet (20 mg total) by mouth daily. (Patient not taking: Reported on 10/06/2014) 30 tablet 11  . ondansetron (ZOFRAN) 4 MG tablet Take 1 tablet (4 mg total) by mouth every 6 (six) hours. 12 tablet 0  . ondansetron (ZOFRAN-ODT) 8 MG disintegrating tablet DISSOLVE ONE TABLET BY MOUTH EVERY 8 HOURS AS NEEDED FOR NAUSEA OR VOMITING (Patient not taking: Reported on 03/29/2014) 30 tablet 0   Allergies  Allergen Reactions  . Stadol [Butorphanol Tartrate] Itching    ROS:  Review of Systems  Constitutional: Negative for fever, chills and fatigue.  HENT: Negative for sinus pressure.   Eyes: Negative for photophobia.  Respiratory: Negative for shortness of breath.   Cardiovascular: Negative for chest pain.  Gastrointestinal: Positive for nausea and abdominal pain. Negative for vomiting, diarrhea and constipation.  Genitourinary: Negative for dysuria, frequency, flank pain, vaginal bleeding, vaginal discharge, difficulty urinating, vaginal pain and pelvic pain.  Musculoskeletal: Negative for neck pain.  Neurological: Positive for headaches. Negative for dizziness and weakness.  Psychiatric/Behavioral: Negative.      I have reviewed patient's Past Medical Hx, Surgical Hx, Family Hx, Social Hx, medications and allergies.   Physical Exam  Patient Vitals for the past 24 hrs:  BP Temp Temp src Pulse Resp SpO2 Height Weight  11/15/14 1922 148/89 mmHg 98.6 F (37 C) Oral 86 20 100 % 5' 2.5" (1.588 m) 103.511 kg (228 lb 3.2 oz)   Constitutional: Well-developed, well-nourished female in no acute distress.  Cardiovascular: normal rate Respiratory: normal effort GI: Abd soft, non-tender, no rebound tenderness or guarding. Pos BS x 4 MS: Extremities nontender, no edema, normal ROM Neurologic: Alert and oriented x 4.  GU: Neg CVAT.  PELVIC EXAM: Cervix pink, visually closed, without lesion, scant white creamy discharge, vaginal  walls and external genitalia normal Bimanual exam: Cervix 0/long/high, firm, anterior, neg CMT, uterus nontender, nonenlarged, adnexa without tenderness, enlargement, or mass  LAB RESULTS Results for orders placed or performed during the hospital encounter of 11/15/14 (from the past 24 hour(s))  Urinalysis, Routine w reflex microscopic (not at [facility])     Status: Abnormal   Collection Time: 11/15/14  7:18 PM  Result Value Ref Range   Color, Urine YELLOW YELLOW   APPearance CLEAR CLEAR   Specific Gravity, Urine <1.005 (L) 1.005 - 1.030   pH 5.5 5.0 - 8.0   Glucose, UA NEGATIVE NEGATIVE mg/dL   Hgb urine dipstick NEGATIVE NEGATIVE   Bilirubin Urine NEGATIVE NEGATIVE   Ketones, ur 15 (A) NEGATIVE mg/dL   Protein, ur NEGATIVE NEGATIVE mg/dL   Urobilinogen, UA 0.2 0.0 - 1.0 mg/dL   Nitrite NEGATIVE NEGATIVE   Leukocytes, UA NEGATIVE NEGATIVE  CBC     Status: Abnormal   Collection Time: 11/15/14  8:15 PM  Result Value Ref Range   WBC 6.7 4.0 - 10.5 K/uL   RBC 4.10 3.87 - 5.11 MIL/uL   Hemoglobin 9.9 (L) 12.0 - 15.0 g/dL  HCT 30.9 (L) 36.0 - 46.0 %   MCV 75.4 (L) 78.0 - 100.0 fL   MCH 24.1 (L) 26.0 - 34.0 pg   MCHC 32.0 30.0 - 36.0 g/dL   RDW 16.1 (H) 09.6 - 04.5 %   Platelets 228 150 - 400 K/uL  hCG, quantitative, pregnancy     Status: Abnormal   Collection Time: 11/15/14  8:15 PM  Result Value Ref Range   hCG, Beta Chain, Quant, S 608 (H) <5 mIU/mL    --/--/A POS, A POS (02/08 1615)  IMAGING US Ob Comp Less 14 Wks  11/15/2014   CLINICAL DATA:  Pelvic pain predominately on the right for 1 week  EXAM: OBSTETRIC <14 WK [place] AND TRANSVAGINAL OB US  TECHNIQUE: Both transabdominal and transvaginal ultrasound examinations were performed for complete evaluation of the gestation as well as the maternal uterus, adnexal regions, and pelvic cul-de-sac. Transvaginal technique was performed to assess early pregnancy.  COMPARISON:  None.  FINDINGS: Intrauterine gestational sac: Not well seen. A  tiny hypoechoic structure is noted which may represent a very early gestational sac. This would correspond with the patient's given clinical dates of 4 weeks 6 days.  Yolk sac:  Not visualized  Embryo:  Not visualized  Cardiac Activity: Not visualized  Maternal uterus/adnexae: The uterus and ovaries are within normal limits. No free fluid is seen.  IMPRESSION: No acute abnormality is noted. Questionable gestational sac as described. Followup examination as clinically necessary.   Electronically Signed   By: Alcide Clever M.D.   On: 11/15/2014 21:23   US Ob Transvaginal  11/15/2014   CLINICAL DATA:  Pelvic pain predominately on the right for 1 week  EXAM: OBSTETRIC <14 WK [place] AND TRANSVAGINAL OB US  TECHNIQUE: Both transabdominal and transvaginal ultrasound examinations were performed for complete evaluation of the gestation as well as the maternal uterus, adnexal regions, and pelvic cul-de-sac. Transvaginal technique was performed to assess early pregnancy.  COMPARISON:  None.  FINDINGS: Intrauterine gestational sac: Not well seen. A tiny hypoechoic structure is noted which may represent a very early gestational sac. This would correspond with the patient's given clinical dates of 4 weeks 6 days.  Yolk sac:  Not visualized  Embryo:  Not visualized  Cardiac Activity: Not visualized  Maternal uterus/adnexae: The uterus and ovaries are within normal limits. No free fluid is seen.  IMPRESSION: No acute abnormality is noted. Questionable gestational sac as described. Followup examination as clinically necessary.   Electronically Signed   By: Alcide Clever M.D.   On: 11/15/2014 21:23    MAU Management/MDM: Ordered labs and reviewed results.  Treatments in MAU included Tylenol 1000 mg PO x 1 dose with reduction in h/a pain. Findings today could represent a normal early pregnancy, spontaneous abortion or ectopic pregnancy which can be life-threatening.  Ectopic precautions were given to the patient with plan to return  in 48 hours for repeat quant hcg to evaluate pregnancy development.  Pt stable at time of discharge.  ASSESSMENT 1. Pregnancy of unknown anatomic location   2. Pelvic pain affecting pregnancy in first trimester, antepartum     PLAN Discharge home with ectopic precautions Return to MAU in 48 hours for labs       Follow-up Information    Follow up with THE [facility].   Why:  In 48 hours for repeat labs or sooner as needed   Contact information:   [address] [id number] mc [place]  [phone]      Sharen Counter Certified Nurse-Midwife 11/15/2014  9:58 PM

## 2014-11-16 LAB — GC/CHLAMYDIA PROBE AMP (~~LOC~~) NOT AT ARMC
CHLAMYDIA, DNA PROBE: NEGATIVE
NEISSERIA GONORRHEA: NEGATIVE

## 2014-11-17 ENCOUNTER — Inpatient Hospital Stay (HOSPITAL_COMMUNITY)
Admission: AD | Admit: 2014-11-17 | Discharge: 2014-11-17 | Disposition: A | Discharge status: Home / Self Care | Payer: Medicaid Other | Source: Ambulatory Visit | Attending: Obstetrics and Gynecology | Admitting: Obstetrics and Gynecology

## 2014-11-17 DIAGNOSIS — O9989 Other specified diseases and conditions complicating pregnancy, childbirth and the puerperium: Secondary | ICD-10-CM | POA: Diagnosis not present

## 2014-11-17 DIAGNOSIS — R109 Unspecified abdominal pain: Secondary | ICD-10-CM

## 2014-11-17 DIAGNOSIS — Z87891 Personal history of nicotine dependence: Secondary | ICD-10-CM | POA: Diagnosis not present

## 2014-11-17 DIAGNOSIS — Z3A01 Less than 8 weeks gestation of pregnancy: Secondary | ICD-10-CM | POA: Insufficient documentation

## 2014-11-17 DIAGNOSIS — O3680X0 Pregnancy with inconclusive fetal viability, not applicable or unspecified: Secondary | ICD-10-CM

## 2014-11-17 DIAGNOSIS — O26891 Other specified pregnancy related conditions, first trimester: Secondary | ICD-10-CM | POA: Diagnosis not present

## 2014-11-17 DIAGNOSIS — O26899 Other specified pregnancy related conditions, unspecified trimester: Secondary | ICD-10-CM

## 2014-11-17 LAB — HCG, QUANTITATIVE, PREGNANCY: HCG, BETA CHAIN, QUANT, S: 1617 m[IU]/mL — AB (ref <5)

## 2014-11-17 NOTE — MAU Provider Note (Signed)
History   [id number]   Chief Complaint  Patient presents with  . Labs Only    HPI Catherine Simmons is a 29 y.o. female [phone] here for follow-up BHCG.  Upon review of the records patient was first seen on 10/6 for abdominal pain.   BHCG on that day was 608.  Ultrasound showed questionable gestational sac.  GC/CT and wet prep were collected.  Results were negative.   Pt discharged home.   Pt here today with report of abdominal cramping, same as 2 days ago and no vaginal bleeding.   All other systems negative.    Patient's last menstrual period was 10/12/2014.  OB History  Gravida Para Term Preterm AB SAB TAB Ectopic Multiple Living  0 3    # Outcome Date GA Lbr Len/2nd Weight Sex Delivery Anes PTL Lv  4 Current           3 Term 03/20/14 [redacted]w[redacted]d 02:01 / 02:04 7 lb 6.5 oz (3.36 kg) M Vag-Spont EPI  Y  2 Term      Vag-Spont   Y  1 Term      Vag-Spont   Y      Past Medical History  Diagnosis Date  . Cholecystitis 07/16/10  . Hypertension   . HIV (human immunodeficiency virus infection) (HCC)   . Genital warts 2004  . Sickle cell trait (HCC)     Family History  Problem Relation Age of Onset  . Cancer Mother   . Diabetes Maternal Aunt     Social History   Social History  . Marital Status: Single    Spouse Name: N/A  . Number of Children: N/A  . Years of Education: N/A   Social History Main Topics  . Smoking status: Former Smoker    Start date: 03/11/2012    Quit date: 08/24/2012  . Smokeless tobacco: Never Used  . Alcohol Use: No  . Drug Use: No  . Sexual Activity:    Partners: Male    Birth Control/ Protection: None   Other Topics Concern  . Not on file   Social History Narrative    Allergies  Allergen Reactions  . Stadol [Butorphanol Tartrate] Itching    No current facility-administered medications on file prior to encounter.   Current Outpatient Prescriptions on File Prior to Encounter  Medication Sig Dispense Refill  . acetaminophen  (TYLENOL) 500 MG tablet Take 1,000 mg by mouth every 6 (six) hours as needed for mild pain.    Marland Kitchen amLODipine (NORVASC) 10 MG tablet Take 1 tablet (10 mg total) by mouth daily. 30 tablet 11  . darunavir-cobicistat (PREZCOBIX) 800-150 MG per tablet Take 1 tablet by mouth daily. Swallow whole. Do NOT crush, break or chew tablets. Take with food. 30 tablet 11  . darunavir-cobicistat (PREZCOBIX) 800-150 MG per tablet Take 1 tablet by mouth daily. Swallow whole. Do NOT crush, break or chew tablets. Take with food. (Patient not taking: Reported on 10/06/2014) 30 tablet 11  . emtricitabine-tenofovir (TRUVADA) 200-300 MG per tablet Take 1 tablet by mouth daily. 30 tablet 11  . promethazine (PHENERGAN) 25 MG tablet Take 0.5-1 tablets (12.5-25 mg total) by mouth every 6 (six) hours as needed. 30 tablet 2     Physical Exam   Filed Vitals:   11/17/14 2039  BP: 133/90  Pulse: 86  Temp: 98.7 F (37.1 C)  TempSrc: Oral  Resp: 16  Height: 5' 2.5" (1.588 m)  Weight:  224 lb 4 oz (101.719 kg)  SpO2: 100%    Physical Exam  Constitutional: She is oriented to person, place, and time. She appears well-developed and well-nourished. No distress.  HENT:  Head: Normocephalic and atraumatic.  Cardiovascular: Normal rate.   Respiratory: Effort normal. No respiratory distress.  Neurological: She is alert and oriented to person, place, and time.  Skin: She is not diaphoretic.  Psychiatric: She has a normal mood and affect. Her behavior is normal. Judgment and thought content normal.    MAU Course  Procedures Component     Latest Ref Rng 11/15/2014 11/17/2014  HCG, Beta Chain, Quant, S     <5 mIU/mL 608 (H) 1617 (H)      MDM Appropriate rise in BHCG Will schedule outpatient ultrasound for viability Pt plans on getting care in [facility] d/t hypertension & HIV+  Assessment and Plan  29 y.o. G4P3003 at [redacted]w[redacted]d wks Pregnancy Follow-up BHCG Pregnancy of Unknown Location  P: Discharge home Ultrasound will call  to schedule appt Call [facility] for prenatal care Discussed reasons to return to MAU  [name], NP 11/17/2014 9:06 PM

## 2014-11-17 NOTE — MAU Note (Signed)
Here for repeat labs, early pregnant. Having cramps in lower back and lower abd and states this is no change from other day she was here.  Yesterday, reports scant spotting but none today.

## 2014-11-17 NOTE — Discharge Instructions (Signed)
Abdominal Pain During Pregnancy °Abdominal pain is common in pregnancy. Most of the time, it does not cause harm. There are many causes of abdominal pain. Some causes are more serious than others. Some of the causes of abdominal pain in pregnancy are easily diagnosed. Occasionally, the diagnosis takes time to understand. Other times, the cause is not determined. Abdominal pain can be a sign that something is very wrong with the pregnancy, or the pain may have nothing to do with the pregnancy at all. For this reason, always tell your health care provider if you have any abdominal discomfort. °HOME CARE INSTRUCTIONS  °Monitor your abdominal pain for any changes. The following actions may help to alleviate any discomfort you are experiencing: °· Do not have sexual intercourse or put anything in your vagina until your symptoms go away completely. °· Get plenty of rest until your pain improves. °· Drink clear fluids if you feel nauseous. Avoid solid food as long as you are uncomfortable or nauseous. °· Only take over-the-counter or prescription medicine as directed by your health care provider. °· Keep all follow-up appointments with your health care provider. °SEEK IMMEDIATE MEDICAL CARE IF: °· You are bleeding, leaking fluid, or passing tissue from the vagina. °· You have increasing pain or cramping. °· You have persistent vomiting. °· You have painful or bloody urination. °· You have a fever. °· You notice a decrease in your baby's movements. °· You have extreme weakness or feel faint. °· You have shortness of breath, with or without abdominal pain. °· You develop a severe headache with abdominal pain. °· You have abnormal vaginal discharge with abdominal pain. °· You have persistent diarrhea. °· You have abdominal pain that continues even after rest, or gets worse. °MAKE SURE YOU:  °· Understand these instructions. °· Will watch your condition. °· Will get help right away if you are not doing well or get worse. °    °This information is not intended to replace advice given to you by your health care provider. Make sure you discuss any questions you have with your health care provider. °  °Document Released: 01/26/2005 Document Revised: 11/16/2012 Document Reviewed: 08/25/2012 °Elsevier Interactive Patient Education ©2016 [employer] ° °First Trimester of Pregnancy °The first trimester of pregnancy is from week 1 until the end of week 12 (months 1 through 3). A week after a sperm fertilizes an egg, the egg will implant on the wall of the uterus. This embryo will begin to develop into a baby. Genes from you and your partner are forming the baby. The female genes determine whether the baby is a boy or a girl. At 6-8 weeks, the eyes and face are formed, and the heartbeat can be seen on ultrasound. At the end of 12 weeks, all the baby's organs are formed.  °Now that you are pregnant, you will want to do everything you can to have a healthy baby. Two of the most important things are to get good prenatal care and to follow your health care provider's instructions. Prenatal care is all the medical care you receive before the baby's birth. This care will help prevent, find, and treat any problems during the pregnancy and childbirth. °BODY CHANGES °Your body goes through many changes during pregnancy. The changes vary from woman to woman.  °· You may gain or lose a couple of pounds at first. °· You may feel sick to your stomach (nauseous) and throw up (vomit). If the vomiting is uncontrollable, call your health care   provider. °· You may tire easily. °· You may develop headaches that can be relieved by medicines approved by your health care provider. °· You may urinate more often. Painful urination may mean you have a bladder infection. °· You may develop heartburn as a result of your pregnancy. °· You may develop constipation because certain hormones are causing the muscles that push waste through your intestines to slow down. °· You  may develop hemorrhoids or swollen, bulging veins (varicose veins). °· Your breasts may begin to grow larger and become tender. Your nipples may stick out more, and the tissue that surrounds them (areola) may become darker. °· Your gums may bleed and may be sensitive to brushing and flossing. °· Dark spots or blotches (chloasma, mask of pregnancy) may develop on your face. This will likely fade after the baby is born. °· Your menstrual periods will stop. °· You may have a loss of appetite. °· You may develop cravings for certain kinds of food. °· You may have changes in your emotions from day to day, such as being excited to be pregnant or being concerned that something may go wrong with the pregnancy and baby. °· You may have more vivid and strange dreams. °· You may have changes in your hair. These can include thickening of your hair, rapid growth, and changes in texture. Some women also have hair loss during or after pregnancy, or hair that feels dry or thin. Your hair will most likely return to normal after your baby is born. °WHAT TO EXPECT AT YOUR PRENATAL VISITS °During a routine prenatal visit: °· You will be weighed to make sure you and the baby are growing normally. °· Your blood pressure will be taken. °· Your abdomen will be measured to track your baby's growth. °· The fetal heartbeat will be listened to starting around week 10 or 12 of your pregnancy. °· Test results from any previous visits will be discussed. °Your health care provider may ask you: °· How you are feeling. °· If you are feeling the baby move. °· If you have had any abnormal symptoms, such as leaking fluid, bleeding, severe headaches, or abdominal cramping. °· If you are using any tobacco products, including cigarettes, chewing tobacco, and electronic cigarettes. °· If you have any questions. °Other tests that may be performed during your first trimester include: °· Blood tests to find your blood type and to check for the presence of any  previous infections. They will also be used to check for low iron levels (anemia) and Rh antibodies. Later in the pregnancy, blood tests for diabetes will be done along with other tests if problems develop. °· Urine tests to check for infections, diabetes, or protein in the urine. °· An ultrasound to confirm the proper growth and development of the baby. °· An amniocentesis to check for possible genetic problems. °· Fetal screens for spina bifida and Down syndrome. °· You may need other tests to make sure you and the baby are doing well. °· HIV (human immunodeficiency virus) testing. Routine prenatal testing includes screening for HIV, unless you choose not to have this test. °HOME CARE INSTRUCTIONS  °Medicines °· Follow your health care provider's instructions regarding medicine use. Specific medicines may be either safe or unsafe to take during pregnancy. °· Take your prenatal vitamins as directed. °· If you develop constipation, try taking a stool softener if your health care provider approves. °Diet °· Eat regular, well-balanced meals. Choose a variety of foods, such as meat   or vegetable-based protein, fish, milk and low-fat dairy products, vegetables, fruits, and whole grain breads and cereals. Your health care provider will help you determine the amount of weight gain that is right for you. °· Avoid raw meat and uncooked cheese. These carry germs that can cause birth defects in the baby. °· Eating four or five small meals rather than three large meals a day may help relieve nausea and vomiting. If you start to feel nauseous, eating a few soda crackers can be helpful. Drinking liquids between meals instead of during meals also seems to help nausea and vomiting. °· If you develop constipation, eat more high-fiber foods, such as fresh vegetables or fruit and whole grains. Drink enough fluids to keep your urine clear or pale yellow. °Activity and Exercise °· Exercise only as directed by your health care provider.  Exercising will help you: °¨ Control your weight. °¨ Stay in shape. °¨ Be prepared for labor and delivery. °· Experiencing pain or cramping in the lower abdomen or low back is a good sign that you should stop exercising. Check with your health care provider before continuing normal exercises. °· Try to avoid standing for long periods of time. Move your legs often if you must stand in one place for a long time. °· Avoid heavy lifting. °· Wear low-heeled shoes, and practice good posture. °· You may continue to have sex unless your health care provider directs you otherwise. °Relief of Pain or Discomfort °· Wear a good support bra for breast tenderness.   °· Take warm sitz baths to soothe any pain or discomfort caused by hemorrhoids. Use hemorrhoid cream if your health care provider approves.   °· Rest with your legs elevated if you have leg cramps or low back pain. °· If you develop varicose veins in your legs, wear support hose. Elevate your feet for 15 minutes, 3-4 times a day. Limit salt in your diet. °Prenatal Care °· Schedule your prenatal visits by the twelfth week of pregnancy. They are usually scheduled monthly at first, then more often in the last 2 months before delivery. °· Write down your questions. Take them to your prenatal visits. °· Keep all your prenatal visits as directed by your health care provider. °Safety °· Wear your seat belt at all times when driving. °· Make a list of emergency phone numbers, including numbers for family, friends, the hospital, and police and fire departments. °General Tips °· Ask your health care provider for a referral to a local prenatal education class. Begin classes no later than at the beginning of month 6 of your pregnancy. °· Ask for help if you have counseling or nutritional needs during pregnancy. Your health care provider can offer advice or refer you to specialists for help with various needs. °· Do not use hot tubs, steam rooms, or saunas. °· Do not douche or use  tampons or scented sanitary pads. °· Do not cross your legs for long periods of time. °· Avoid cat litter boxes and soil used by cats. These carry germs that can cause birth defects in the baby and possibly loss of the fetus by miscarriage or stillbirth. °· Avoid all smoking, herbs, alcohol, and medicines not prescribed by your health care provider. Chemicals in these affect the formation and growth of the baby. °· Do not use any tobacco products, including cigarettes, chewing tobacco, and electronic cigarettes. If you need help quitting, ask your health care provider. You may receive counseling support and other resources to help you quit. °·   Schedule a dentist appointment. At home, brush your teeth with a soft toothbrush and be gentle when you floss. °SEEK MEDICAL CARE IF:  °· You have dizziness. °· You have mild pelvic cramps, pelvic pressure, or nagging pain in the abdominal area. °· You have persistent nausea, vomiting, or diarrhea. °· You have a bad smelling vaginal discharge. °· You have pain with urination. °· You notice increased swelling in your face, hands, legs, or ankles. °SEEK IMMEDIATE MEDICAL CARE IF:  °· You have a fever. °· You are leaking fluid from your vagina. °· You have spotting or bleeding from your vagina. °· You have severe abdominal cramping or pain. °· You have rapid weight gain or loss. °· You vomit blood or material that looks like coffee grounds. °· You are exposed to German measles and have never had them. °· You are exposed to fifth disease or chickenpox. °· You develop a severe headache. °· You have shortness of breath. °· You have any kind of trauma, such as from a fall or a car accident. °  °This information is not intended to replace advice given to you by your health care provider. Make sure you discuss any questions you have with your health care provider. °  °Document Released: 01/20/2001 Document Revised: 02/16/2014 Document Reviewed: 12/06/2012 °Elsevier Interactive Patient  Education ©2016 [employer] ° °

## 2014-11-17 NOTE — MAU Note (Signed)
[name] NP in Triage to discuss lab results and d/c plan. Pt d/c home from Triage

## 2014-11-21 ENCOUNTER — Other Ambulatory Visit: Payer: Self-pay | Admitting: Pharmacist Clinician (PhC)/ Clinical Pharmacy Specialist

## 2014-11-21 ENCOUNTER — Other Ambulatory Visit: Payer: Self-pay | Admitting: Internal Medicine

## 2014-11-21 ENCOUNTER — Other Ambulatory Visit: Payer: Medicaid Other

## 2014-11-21 NOTE — Telephone Encounter (Signed)
Pt had labs drawn 11/01/14 per Dr. Daiva EvesVan Dam.  RN spoke with Gay FillerM. Pham, pharmacist, re: pt being on Prezcobix/Truvada during pregnancy.  M. Pham shared that the Prezcobix needs to be changed during pregnancy and to get the pt an appointment as soon as possible to review/manage HIV regimen.  RN spoke with the pt.  She is going to stop the Prezcobix/Truvada until her appointment with Dr. Ninetta LightsHatcher scheduled for next Tuesday, Oct. 18 @ 2:45 PM.

## 2014-11-23 ENCOUNTER — Ambulatory Visit (HOSPITAL_COMMUNITY)
Admission: RE | Admit: 2014-11-23 | Discharge: 2014-11-23 | Disposition: A | Discharge status: Home / Self Care | Payer: Medicaid Other | Source: Ambulatory Visit | Attending: Student | Admitting: Student

## 2014-11-23 ENCOUNTER — Inpatient Hospital Stay (HOSPITAL_COMMUNITY)
Admission: AD | Admit: 2014-11-23 | Discharge: 2014-11-23 | Disposition: A | Discharge status: Home / Self Care | Payer: Medicaid Other | Source: Ambulatory Visit | Attending: Obstetrics & Gynecology | Admitting: Obstetrics & Gynecology

## 2014-11-23 DIAGNOSIS — O9989 Other specified diseases and conditions complicating pregnancy, childbirth and the puerperium: Secondary | ICD-10-CM | POA: Diagnosis not present

## 2014-11-23 DIAGNOSIS — R109 Unspecified abdominal pain: Secondary | ICD-10-CM | POA: Diagnosis not present

## 2014-11-23 DIAGNOSIS — Z3A01 Less than 8 weeks gestation of pregnancy: Secondary | ICD-10-CM | POA: Diagnosis not present

## 2014-11-23 DIAGNOSIS — R102 Pelvic and perineal pain: Secondary | ICD-10-CM | POA: Diagnosis present

## 2014-11-23 DIAGNOSIS — O26891 Other specified pregnancy related conditions, first trimester: Secondary | ICD-10-CM | POA: Insufficient documentation

## 2014-11-23 DIAGNOSIS — O0990 Supervision of high risk pregnancy, unspecified, unspecified trimester: Secondary | ICD-10-CM

## 2014-11-23 DIAGNOSIS — O26899 Other specified pregnancy related conditions, unspecified trimester: Secondary | ICD-10-CM

## 2014-11-23 DIAGNOSIS — O3680X0 Pregnancy with inconclusive fetal viability, not applicable or unspecified: Secondary | ICD-10-CM

## 2014-11-23 IMAGING — US US OB TRANSVAGINAL
1 series · 15 of 28 positions shown · non-contrast
Comparison: [DATE]

CLINICAL DATA: Pelvic pain.  Assessment for fetal viability

EXAM:
TRANSVAGINAL OB ULTRASOUND
TECHNIQUE: Transvaginal ultrasound was performed for complete evaluation of the
gestation as well as the maternal uterus, adnexal regions, and
pelvic cul-de-sac.

[Series 1: us ob transvaginal · 31 acquisitions, 15 frames shown]
[im 1/31]
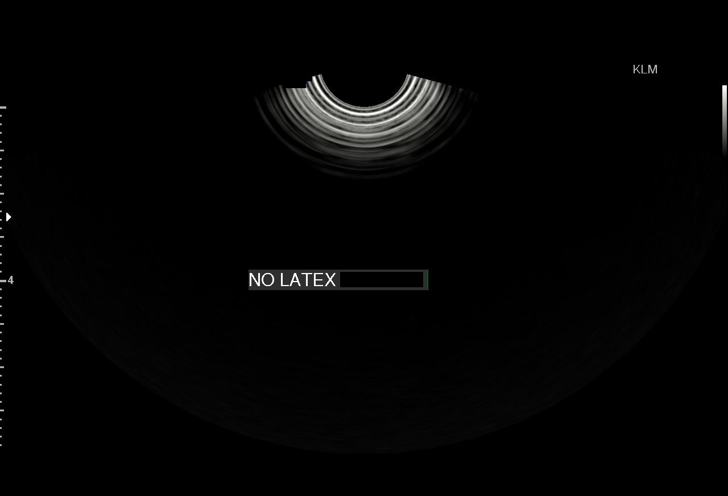
[im 3/31]
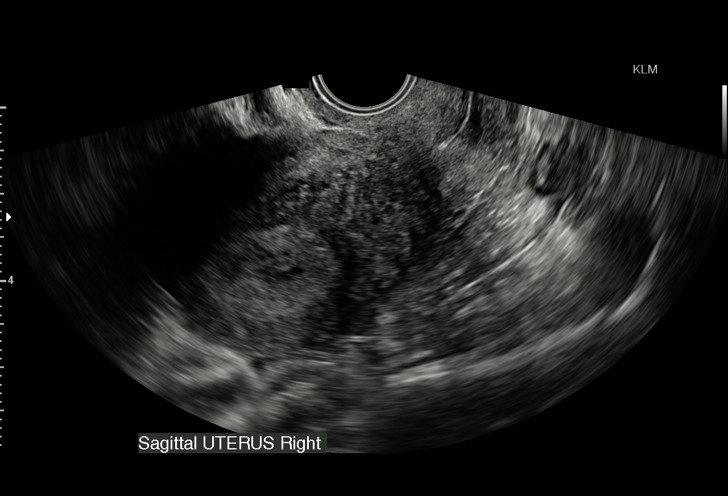
[im 5/31]
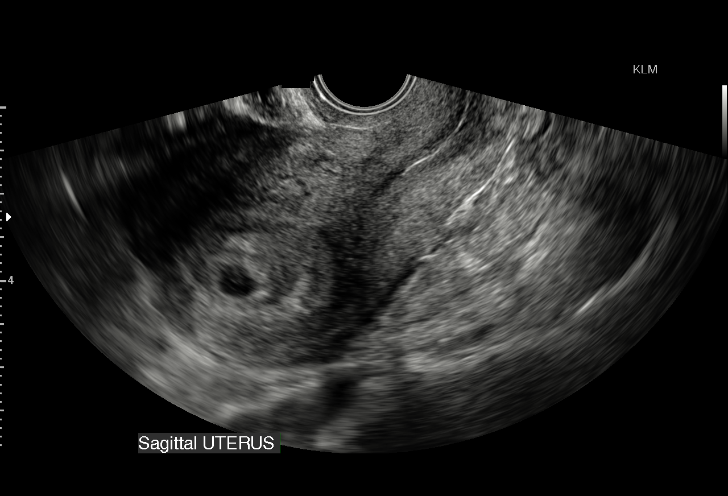
[im 7/31]
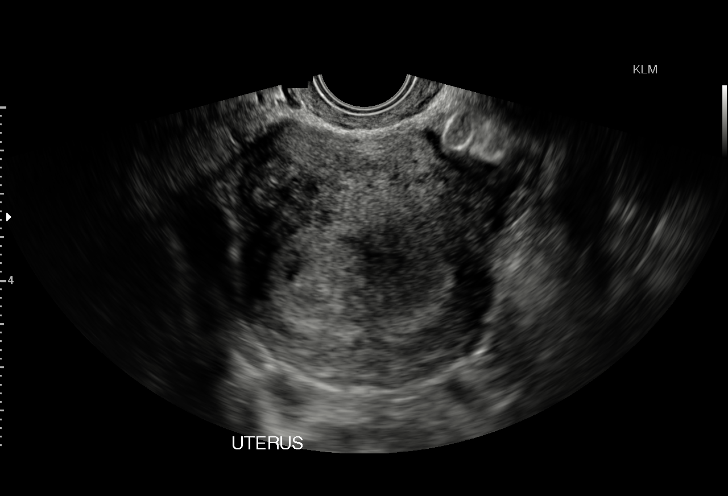
[im 9/31]
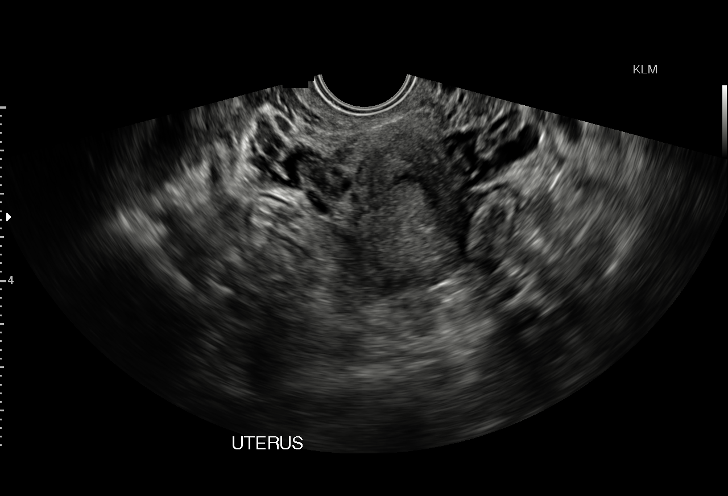
[im 12/31]
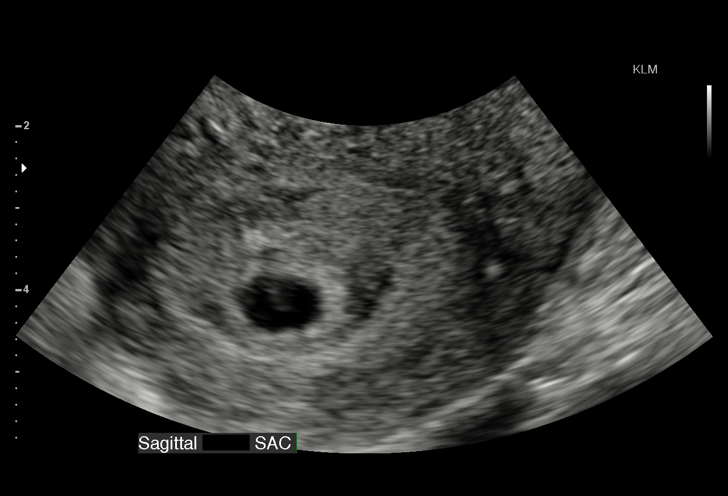
[im 14/31]
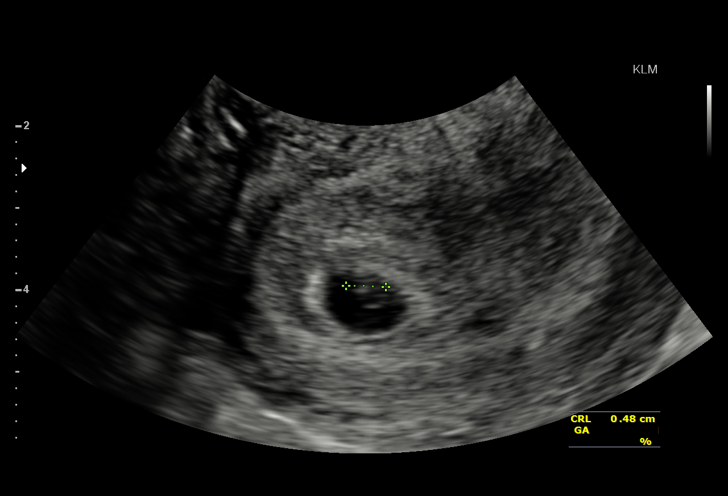
[im 16/31]
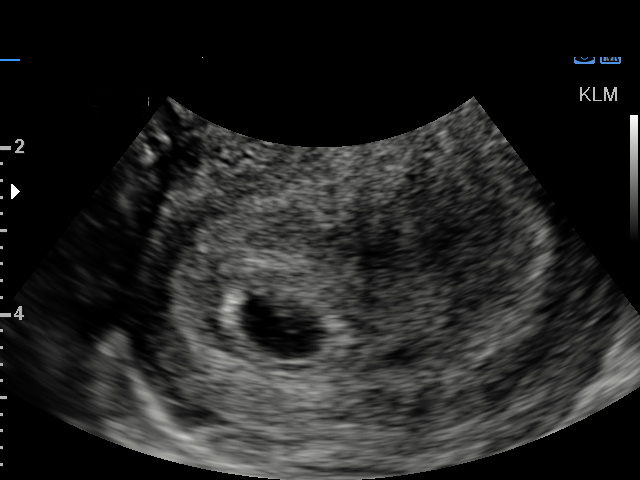
[im 17/31]
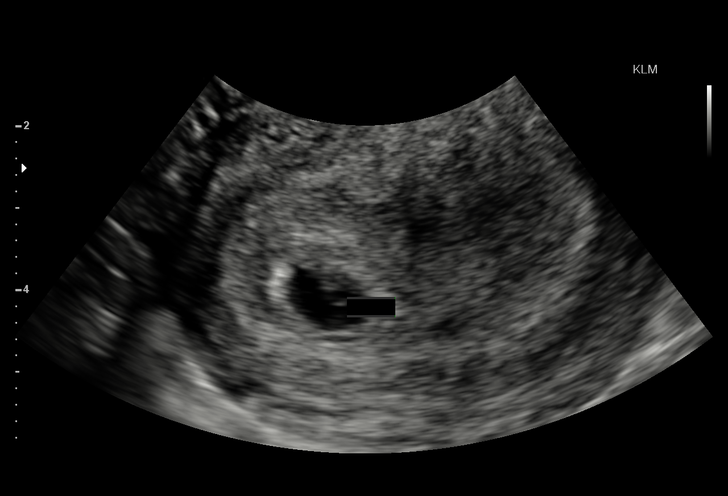
[im 19/31]
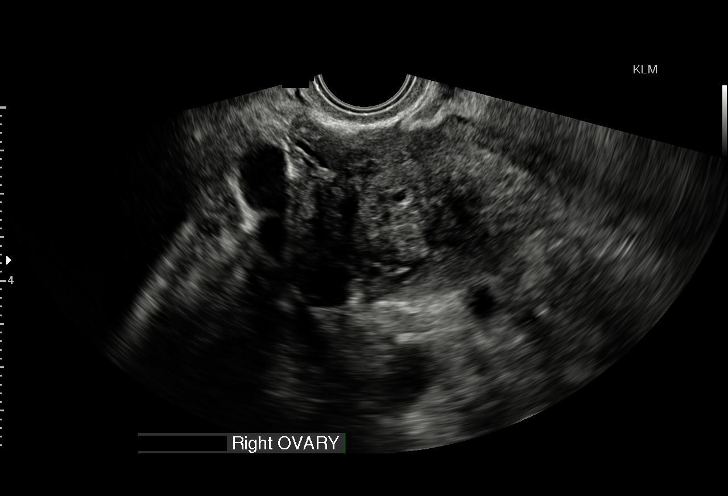
[im 22/31]
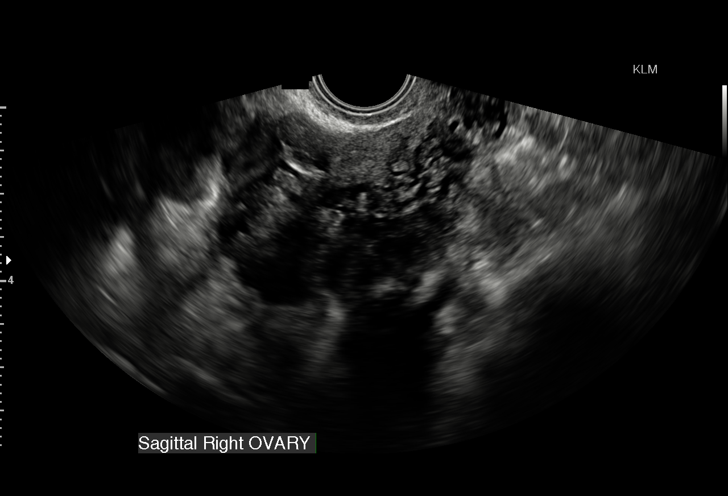
[im 24/31]
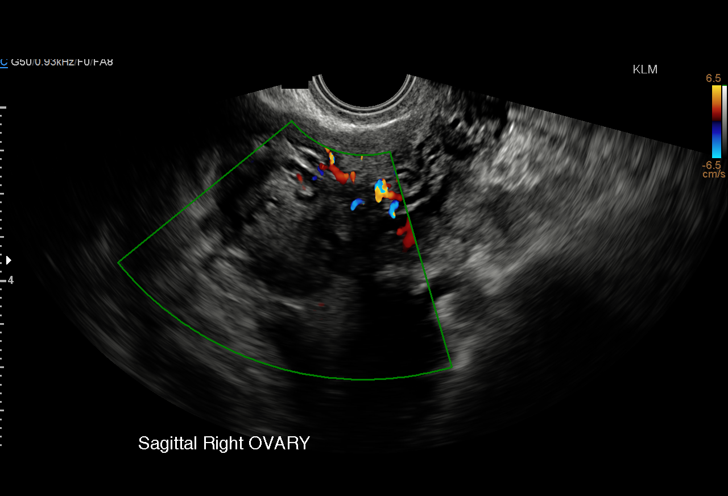
[im 26/31]
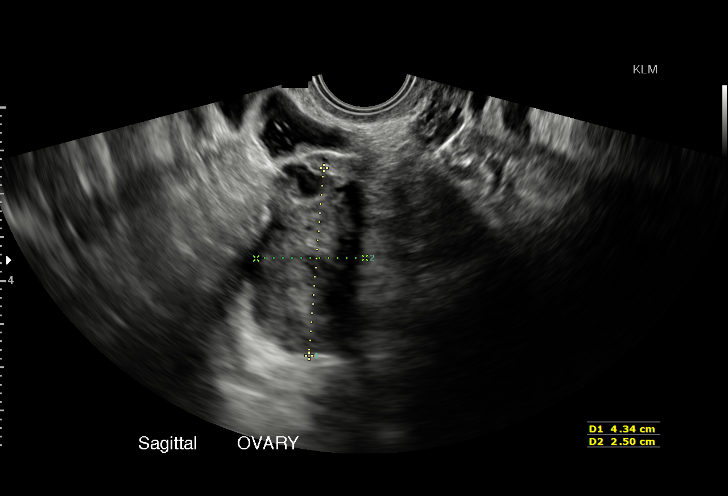
[im 28/31]
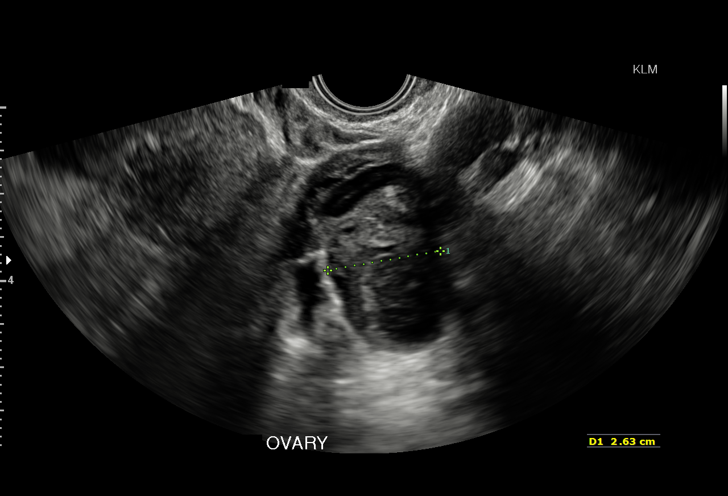
[im 31/31]
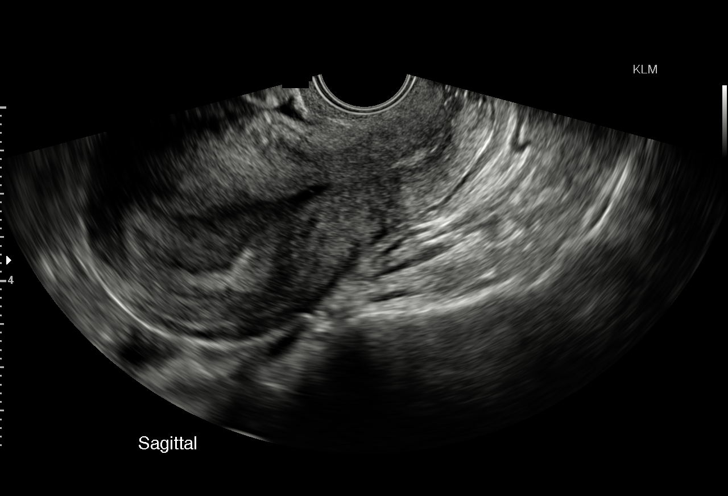

[15 of 28 positions shown; findings below may reference images not displayed]

FINDINGS: Intrauterine gestational sac: Visualized/normal in shape.

Yolk sac:  Visualized

Embryo:  Visualized

Cardiac Activity: Visualized

Heart Rate: Could not be accurately measured indeed to small size of
fetal pole

CRL:   5  mm   6 w 1 d                  US EDC: [DATE]

Maternal uterus/adnexae: There is no demonstrable subchorionic
hemorrhage. Cervical os is closed. There is no maternal extrauterine
pelvic or adnexal mass. No maternal free pelvic fluid.
IMPRESSION: Single live intrauterine gestation with estimated gestational age of
6 weeks. Fetal heart activity could be visualized, but an accurate
trace to measure fetal heart rate could not be performed due to the
small size of the fetus at this time. Study otherwise unremarkable.

## 2014-11-23 NOTE — MAU Provider Note (Signed)
S: here to f/u on u/s results. Seen here last week with abdominal pain. HCG rising, u/s at that time equivocal for viability. Had repeat u/s here today.  O: Well-appearing, no pain, denies vaginal bleeding. OB ultrasound showing viable intrauterine pregnancy with gestational age of [redacted] weeks.  A/P: Viable IUP. Advised to start prenatal vitamins, stop smoking, and [facility] as has cHTN and HIV. Also advised to schedule ID f/u ASAP to review HIV meds. [facility]

## 2014-11-26 ENCOUNTER — Ambulatory Visit (HOSPITAL_COMMUNITY): Payer: Medicaid Other

## 2014-11-27 ENCOUNTER — Ambulatory Visit (INDEPENDENT_AMBULATORY_CARE_PROVIDER_SITE_OTHER): Payer: Medicaid Other | Admitting: Infectious Diseases

## 2014-11-27 ENCOUNTER — Encounter: Payer: Self-pay | Admitting: Infectious Diseases

## 2014-11-27 VITALS — BP 136/85 | HR 75 | Temp 98.5°F | Ht 62.5 in | Wt 224.1 lb

## 2014-11-27 DIAGNOSIS — B20 Human immunodeficiency virus [HIV] disease: Secondary | ICD-10-CM

## 2014-11-27 DIAGNOSIS — Z79899 Other long term (current) drug therapy: Secondary | ICD-10-CM

## 2014-11-27 DIAGNOSIS — Z113 Encounter for screening for infections with a predominantly sexual mode of transmission: Secondary | ICD-10-CM

## 2014-11-27 DIAGNOSIS — O10911 Unspecified pre-existing hypertension complicating pregnancy, first trimester: Secondary | ICD-10-CM

## 2014-11-27 DIAGNOSIS — Z23 Encounter for immunization: Secondary | ICD-10-CM

## 2014-11-27 DIAGNOSIS — O0991 Supervision of high risk pregnancy, unspecified, first trimester: Secondary | ICD-10-CM

## 2014-11-27 MED ORDER — EMTRICITAB-RILPIVIR-TENOFOV AF 200-25-25 MG PO TABS: 1.0000 | ORAL_TABLET | Freq: Every day | ORAL | Status: DC

## 2014-11-27 NOTE — Assessment & Plan Note (Signed)
Will change her ART to odefsy Asked to take with food She is NOT breastfeeding.  Offered condoms Given flu shot Will see her back in 4-6 weeks.

## 2014-11-27 NOTE — Assessment & Plan Note (Signed)
BP fairly normal today Will f/u with OB to see if she needs to start therapy.

## 2014-11-27 NOTE — Assessment & Plan Note (Signed)
She has appt with high risk on 12-20-14.  She has thought about termination.  She is on PNV She has gotten flu shot.

## 2014-11-27 NOTE — Progress Notes (Signed)
   Subjective:    Patient ID: Catherine MccallumMarkita L Simmons, female    DOB: 07/20/1985, 29 y.o.   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  Was prev on DRVc/TRV. She was prev on OCP, prev on injection but had too much bleeding.  Has appt for high risk OB on 12-11-14   HIV 1 RNA QUANT (copies/mL)  Date Value  11/01/2014 <20  10/12/2013 <20  05/31/2013 <20   HIV-1 RNA VIRAL LOAD (no units)  Date Value  05/15/2014 <40  04/09/2014 <40  02/19/2014 <20   CD4 (no units)  Date Value  04/09/2014 1016  02/19/2014 776  12/27/2013 651   CD4 T CELL ABS (/uL)  Date Value  11/01/2014 1000  10/12/2013 600  05/31/2013 830   Review of Systems  Constitutional: Negative for appetite change and unexpected weight change.  Gastrointestinal: Positive for nausea. Negative for diarrhea and constipation.  Genitourinary: Negative for difficulty urinating.  Neurological: Positive for headaches.  babies are all HIV -    Objective:   Physical Exam  Constitutional: She appears well-developed and well-nourished.  HENT:  Mouth/Throat: No oropharyngeal exudate.  Eyes: EOM are normal. Pupils are equal, round, and reactive to light.  Neck: Neck supple.  Cardiovascular: Normal rate, regular rhythm and normal heart sounds.   Pulmonary/Chest: Effort normal and breath sounds normal.  Abdominal: Soft. Bowel sounds are normal. There is no tenderness. There is no rebound.  Musculoskeletal: She exhibits no edema.  Lymphadenopathy:    She has no cervical adenopathy.       Assessment & Plan:

## 2014-12-05 ENCOUNTER — Ambulatory Visit: Payer: Medicaid Other | Admitting: Internal Medicine

## 2014-12-20 ENCOUNTER — Ambulatory Visit (INDEPENDENT_AMBULATORY_CARE_PROVIDER_SITE_OTHER): Payer: Medicaid Other | Admitting: Family Medicine

## 2014-12-20 ENCOUNTER — Other Ambulatory Visit (HOSPITAL_COMMUNITY)
Admission: RE | Admit: 2014-12-20 | Discharge: 2014-12-20 | Disposition: A | Discharge status: Home / Self Care | Payer: Medicaid Other | Source: Ambulatory Visit | Attending: Family Medicine | Admitting: Family Medicine

## 2014-12-20 ENCOUNTER — Encounter: Payer: Self-pay | Admitting: Family Medicine

## 2014-12-20 VITALS — BP 150/78 | HR 82 | Temp 97.5°F | Wt 221.5 lb

## 2014-12-20 DIAGNOSIS — O09891 Supervision of other high risk pregnancies, first trimester: Secondary | ICD-10-CM | POA: Diagnosis not present

## 2014-12-20 DIAGNOSIS — O0991 Supervision of high risk pregnancy, unspecified, first trimester: Secondary | ICD-10-CM

## 2014-12-20 DIAGNOSIS — Z113 Encounter for screening for infections with a predominantly sexual mode of transmission: Secondary | ICD-10-CM | POA: Diagnosis not present

## 2014-12-20 DIAGNOSIS — O3680X1 Pregnancy with inconclusive fetal viability, fetus 1: Secondary | ICD-10-CM

## 2014-12-20 DIAGNOSIS — O3680X0 Pregnancy with inconclusive fetal viability, not applicable or unspecified: Secondary | ICD-10-CM

## 2014-12-20 DIAGNOSIS — O10911 Unspecified pre-existing hypertension complicating pregnancy, first trimester: Secondary | ICD-10-CM

## 2014-12-20 LAB — POCT URINALYSIS DIP (DEVICE)
Bilirubin Urine: NEGATIVE
GLUCOSE, UA: NEGATIVE mg/dL
HGB URINE DIPSTICK: NEGATIVE
Ketones, ur: NEGATIVE mg/dL
NITRITE: NEGATIVE
Protein, ur: NEGATIVE mg/dL
Specific Gravity, Urine: 1.025 (ref 1.005–1.030)
UROBILINOGEN UA: 0.2 mg/dL (ref 0.0–1.0)
pH: 6.5 (ref 5.0–8.0)

## 2014-12-20 MED ORDER — LABETALOL HCL 200 MG PO TABS: 200.0000 mg | ORAL_TABLET | Freq: Two times a day (BID) | ORAL | Status: DC

## 2014-12-20 NOTE — Patient Instructions (Signed)
First Trimester of Pregnancy The first trimester of pregnancy is from week 1 until the end of week 12 (months 1 through 3). A week after a sperm fertilizes an egg, the egg will implant on the wall of the uterus. This embryo will begin to develop into a baby. Genes from you and your partner are forming the baby. The female genes determine whether the baby is a boy or a girl. At 6-8 weeks, the eyes and face are formed, and the heartbeat can be seen on ultrasound. At the end of 12 weeks, all the baby's organs are formed.  Now that you are pregnant, you will want to do everything you can to have a healthy baby. Two of the most important things are to get good prenatal care and to follow your health care provider's instructions. Prenatal care is all the medical care you receive before the baby's birth. This care will help prevent, find, and treat any problems during the pregnancy and childbirth. BODY CHANGES Your body goes through many changes during pregnancy. The changes vary from woman to woman.   You may gain or lose a couple of pounds at first.  You may feel sick to your stomach (nauseous) and throw up (vomit). If the vomiting is uncontrollable, call your health care provider.  You may tire easily.  You may develop headaches that can be relieved by medicines approved by your health care provider.  You may urinate more often. Painful urination may mean you have a bladder infection.  You may develop heartburn as a result of your pregnancy.  You may develop constipation because certain hormones are causing the muscles that push waste through your intestines to slow down.  You may develop hemorrhoids or swollen, bulging veins (varicose veins).  Your breasts may begin to grow larger and become tender. Your nipples may stick out more, and the tissue that surrounds them (areola) may become darker.  Your gums may bleed and may be sensitive to brushing and flossing.  Dark spots or blotches (chloasma,  mask of pregnancy) may develop on your face. This will likely fade after the baby is born.  Your menstrual periods will stop.  You may have a loss of appetite.  You may develop cravings for certain kinds of food.  You may have changes in your emotions from day to day, such as being excited to be pregnant or being concerned that something may go wrong with the pregnancy and baby.  You may have more vivid and strange dreams.  You may have changes in your hair. These can include thickening of your hair, rapid growth, and changes in texture. Some women also have hair loss during or after pregnancy, or hair that feels dry or thin. Your hair will most likely return to normal after your baby is born. WHAT TO EXPECT AT YOUR PRENATAL VISITS During a routine prenatal visit:  You will be weighed to make sure you and the baby are growing normally.  Your blood pressure will be taken.  Your abdomen will be measured to track your baby's growth.  The fetal heartbeat will be listened to starting around week 10 or 12 of your pregnancy.  Test results from any previous visits will be discussed. Your health care provider may ask you:  How you are feeling.  If you are feeling the baby move.  If you have had any abnormal symptoms, such as leaking fluid, bleeding, severe headaches, or abdominal cramping.  If you are using any tobacco products,   including cigarettes, chewing tobacco, and electronic cigarettes.  If you have any questions. Other tests that may be performed during your first trimester include:  Blood tests to find your blood type and to check for the presence of any previous infections. They will also be used to check for low iron levels (anemia) and Rh antibodies. Later in the pregnancy, blood tests for diabetes will be done along with other tests if problems develop.  Urine tests to check for infections, diabetes, or protein in the urine.  An ultrasound to confirm the proper growth  and development of the baby.  An amniocentesis to check for possible genetic problems.  Fetal screens for spina bifida and Down syndrome.  You may need other tests to make sure you and the baby are doing well.  HIV (human immunodeficiency virus) testing. Routine prenatal testing includes screening for HIV, unless you choose not to have this test. HOME CARE INSTRUCTIONS  Medicines  Follow your health care provider's instructions regarding medicine use. Specific medicines may be either safe or unsafe to take during pregnancy.  Take your prenatal vitamins as directed.  If you develop constipation, try taking a stool softener if your health care provider approves. Diet  Eat regular, well-balanced meals. Choose a variety of foods, such as meat or vegetable-based protein, fish, milk and low-fat dairy products, vegetables, fruits, and whole grain breads and cereals. Your health care provider will help you determine the amount of weight gain that is right for you.  Avoid raw meat and uncooked cheese. These carry germs that can cause birth defects in the baby.  Eating four or five small meals rather than three large meals a day may help relieve nausea and vomiting. If you start to feel nauseous, eating a few soda crackers can be helpful. Drinking liquids between meals instead of during meals also seems to help nausea and vomiting.  If you develop constipation, eat more high-fiber foods, such as fresh vegetables or fruit and whole grains. Drink enough fluids to keep your urine clear or pale yellow. Activity and Exercise  Exercise only as directed by your health care provider. Exercising will help you:  Control your weight.  Stay in shape.  Be prepared for labor and delivery.  Experiencing pain or cramping in the lower abdomen or low back is a good sign that you should stop exercising. Check with your health care provider before continuing normal exercises.  Try to avoid standing for long  periods of time. Move your legs often if you must stand in one place for a long time.  Avoid heavy lifting.  Wear low-heeled shoes, and practice good posture.  You may continue to have sex unless your health care provider directs you otherwise. Relief of Pain or Discomfort  Wear a good support bra for breast tenderness.   Take warm sitz baths to soothe any pain or discomfort caused by hemorrhoids. Use hemorrhoid cream if your health care provider approves.   Rest with your legs elevated if you have leg cramps or low back pain.  If you develop varicose veins in your legs, wear support hose. Elevate your feet for 15 minutes, 3-4 times a day. Limit salt in your diet. Prenatal Care  Schedule your prenatal visits by the twelfth week of pregnancy. They are usually scheduled monthly at first, then more often in the last 2 months before delivery.  Write down your questions. Take them to your prenatal visits.  Keep all your prenatal visits as directed by your   health care provider. Safety  Wear your seat belt at all times when driving.  Make a list of emergency phone numbers, including numbers for family, friends, the hospital, and police and fire departments. General Tips  Ask your health care provider for a referral to a local prenatal education class. Begin classes no later than at the beginning of month 6 of your pregnancy.  Ask for help if you have counseling or nutritional needs during pregnancy. Your health care provider can offer advice or refer you to specialists for help with various needs.  Do not use hot tubs, steam rooms, or saunas.  Do not douche or use tampons or scented sanitary pads.  Do not cross your legs for long periods of time.  Avoid cat litter boxes and soil used by cats. These carry germs that can cause birth defects in the baby and possibly loss of the fetus by miscarriage or stillbirth.  Avoid all smoking, herbs, alcohol, and medicines not prescribed by  your health care provider. Chemicals in these affect the formation and growth of the baby.  Do not use any tobacco products, including cigarettes, chewing tobacco, and electronic cigarettes. If you need help quitting, ask your health care provider. You may receive counseling support and other resources to help you quit.  Schedule a dentist appointment. At home, brush your teeth with a soft toothbrush and be gentle when you floss. SEEK MEDICAL CARE IF:   You have dizziness.  You have mild pelvic cramps, pelvic pressure, or nagging pain in the abdominal area.  You have persistent nausea, vomiting, or diarrhea.  You have a bad smelling vaginal discharge.  You have pain with urination.  You notice increased swelling in your face, hands, legs, or ankles. SEEK IMMEDIATE MEDICAL CARE IF:   You have a fever.  You are leaking fluid from your vagina.  You have spotting or bleeding from your vagina.  You have severe abdominal cramping or pain.  You have rapid weight gain or loss.  You vomit blood or material that looks like coffee grounds.  You are exposed to German measles and have never had them.  You are exposed to fifth disease or chickenpox.  You develop a severe headache.  You have shortness of breath.  You have any kind of trauma, such as from a fall or a car accident.   This information is not intended to replace advice given to you by your health care provider. Make sure you discuss any questions you have with your health care provider.   Document Released: 01/20/2001 Document Revised: 02/16/2014 Document Reviewed: 12/06/2012 Elsevier Interactive Patient Education 2016 [employer]  

## 2014-12-20 NOTE — Progress Notes (Signed)
Nutrition note: 1st visit consult Pt has h/o obesity, HIV, & CHTN. Pt has lost 2.5# @ [redacted]w[redacted]d - pt reports she has had some N&V lately. Pt reports eating 1-2 meals & ~2 snacks/d. Pt is taking gummy PNV - lack of iron in gummy vitamins. Pt reports no heartburn. Pt received verbal & written education on general nutrition during pregnancy. Encouraged protein with all meals & snacks. Encouraged pt to take PNV or 2 chewable multivitamins. Discussed wt gain goals of 11-20# or 0.5#/wk in 2nd & 3rd trimester. Pt agrees to take PNV or equivalent. Pt does not have WIC but plans to apply. Pt is unable to BF due to HIV status. F/u as needed Blondell RevealLaura Shoji Pertuit, MS, RD, LDN, [facility]

## 2014-12-20 NOTE — Progress Notes (Signed)
1st screen with [facility] 01/11/15 @ 1p.

## 2014-12-20 NOTE — Progress Notes (Signed)
Pain- "stabbing in back" New ob packet given  Flu vaccine given @ ID  Home Medicaid Form Completed

## 2014-12-20 NOTE — Progress Notes (Signed)
Subjective:    Catherine Simmons is a [id number] 8086w6d being seen today for her first obstetrical visit.  Her obstetrical history is significant for HIV, CHTN, short interval between pregnancy, history of preeclampsia. Patient does not intend to breast feed. Pregnancy history fully reviewed.  Patient reports no complaints.  Filed Vitals:   12/20/14 0832  BP: 150/78  Pulse: 82  Temp: 97.5 F (36.4 C)  Weight: 221 lb 8 oz (100.472 kg)    HISTORY: OB History  Gravida Para Term Preterm AB SAB TAB Ectopic Multiple Living  4 3 3  0     0 3    # Outcome Date GA Lbr Len/2nd Weight Sex Delivery Anes PTL Lv  4 Current           3 Term 03/20/14 4766w6d 02:01 / 02:04 7 lb 6.5 oz (3.36 kg) M Vag-Spont EPI  Y  2 Term 02/12/10 863w0d  7 lb 4 oz (3.289 kg) M Vag-Spont EPI N Y  1 Term 06/02/04 4826w0d  7 lb 1 oz (3.204 kg) F Vag-Spont EPI Y Y     Past Medical History  Diagnosis Date  . Cholecystitis 07/16/10  . Hypertension   . HIV (human immunodeficiency virus infection) (HCC)   . Genital warts 2004  . Sickle cell trait [facility](HCC)    Past Surgical History  Procedure Laterality Date  . Cholecystectomy    . Wisdom tooth extraction     Family History  Problem Relation Age of Onset  . Cancer Mother   . Diabetes Maternal Aunt      Exam    Uterus:     Pelvic Exam:    Perineum: No Hemorrhoids, Normal Perineum   Vulva: normal, Bartholin's, Urethra, Skene's normal   Adnexa: normal adnexa and no mass, fullness, tenderness   Bony Pelvis: gynecoid  System:     Skin: normal coloration and turgor, no rashes    Neurologic: gait normal; reflexes normal and symmetric   Extremities: normal strength, tone, and muscle mass   HEENT PERRLA and extra ocular movement intact   Mouth/Teeth mucous membranes moist, pharynx normal without lesions   Neck supple and no masses   Cardiovascular: regular rate and rhythm, no murmurs or gallops   Respiratory:  appears well, vitals normal, no respiratory distress,  acyanotic, normal RR, ear and throat exam is normal, neck free of mass or lymphadenopathy, chest clear, no wheezing, crepitations, rhonchi, normal symmetric air entry   Abdomen: soft, non-tender; bowel sounds normal; no masses,  no organomegaly   Urinary: urethral meatus normal      Assessment:    Pregnancy: [id number] Patient Active Problem List   Diagnosis Date Noted  . HIV disease affecting pregnancy, antepartum 11/16/2013    Priority: High  . Supervision of high-risk pregnancy 08/31/2013    Priority: High  . HTN in pregnancy, chronic 08/31/2013    Priority: High  . Human immunodeficiency virus (HIV) disease (HCC) 10/12/2012    Priority: High  . OBESITY 05/09/2008    Priority: Medium  . Chronic hypertension 03/19/2014  . [redacted] weeks gestation of pregnancy   . Group B Streptococcus carrier, +RV culture, currently pregnant 03/01/2014  . Obesity affecting pregnancy in third trimester, antepartum   . Preexisting hypertension complicating pregnancy, antepartum   . Hx of pelvic inflammatory disease 08/31/2013  . Anxiety 06/30/2013  . Other disorder of menstruation and other abnormal bleeding from female genital tract 10/07/2012  . Pelvic pain 06/03/2012  . CARPAL TUNNEL SYNDROME 11/14/2009  .  NUMMULAR ECZEMA 11/14/2009  . SMOKER 05/09/2008  . DEPRESSION 05/09/2008        Plan:     Initial labs drawn. 24hr urine for protein CMP for Cr ASA starting at 12 weeks. Prenatal vitamins. Problem list reviewed and updated. Genetic Screening discussed First Screen: ordered.  Ultrasound discussed; fetal survey: requested.  Follow up in 4 weeks. 1 weeks BP check 50% of 30 min visit spent on counseling and coordination of care.     Catherine Simmons 12/20/2014

## 2014-12-20 NOTE — Progress Notes (Signed)
Bedside US for viability = FHR - 164 per PW doppler

## 2014-12-21 LAB — PRENATAL PROFILE (SOLSTAS)
ANTIBODY SCREEN: NEGATIVE
BASOS PCT: 0 % (ref 0–1)
Basophils Absolute: 0 10*3/uL (ref 0.0–0.1)
EOS ABS: 0.1 10*3/uL (ref 0.0–0.7)
Eosinophils Relative: 1 % (ref 0–5)
HEMATOCRIT: 31.1 % — AB (ref 36.0–46.0)
HEMOGLOBIN: 10.3 g/dL — AB (ref 12.0–15.0)
HIV 1&2 Ab, 4th Generation: REACTIVE — AB
Hepatitis B Surface Ag: NEGATIVE
Lymphocytes Relative: 33 % (ref 12–46)
Lymphs Abs: 2 10*3/uL (ref 0.7–4.0)
MCH: 25.1 pg — ABNORMAL LOW (ref 26.0–34.0)
MCHC: 33.1 g/dL (ref 30.0–36.0)
MCV: 75.9 fL — ABNORMAL LOW (ref 78.0–100.0)
MONO ABS: 0.6 10*3/uL (ref 0.1–1.0)
MONOS PCT: 10 % (ref 3–12)
MPV: 9 fL (ref 8.6–12.4)
NEUTROS ABS: 3.4 10*3/uL (ref 1.7–7.7)
NEUTROS PCT: 56 % (ref 43–77)
Platelets: 258 10*3/uL (ref 150–400)
RBC: 4.1 MIL/uL (ref 3.87–5.11)
RDW: 15.8 % — ABNORMAL HIGH (ref 11.5–15.5)
RUBELLA: 3.36 {index} — AB (ref <0.90)
Rh Type: POSITIVE
WBC: 6 10*3/uL (ref 4.0–10.5)

## 2014-12-21 LAB — GC/CHLAMYDIA PROBE AMP (~~LOC~~) NOT AT ARMC
CHLAMYDIA, DNA PROBE: NEGATIVE
NEISSERIA GONORRHEA: NEGATIVE

## 2014-12-21 LAB — CULTURE, OB URINE

## 2014-12-21 LAB — HIV 1/2 CONFIRMATION
HIV-1 antibody: POSITIVE — AB
HIV-2 Ab: NEGATIVE

## 2014-12-21 LAB — GLUCOSE TOLERANCE, 1 HOUR (50G) W/O FASTING: GLUCOSE 1 HOUR GTT: 106 mg/dL (ref 70–140)

## 2014-12-25 LAB — PRESCRIPTION MONITORING PROFILE (19 PANEL)
AMPHETAMINE/METH: NEGATIVE ng/mL
BARBITURATE SCREEN, URINE: NEGATIVE ng/mL
BENZODIAZEPINE SCREEN, URINE: NEGATIVE ng/mL
BUPRENORPHINE, URINE: NEGATIVE ng/mL
Carisoprodol, Urine: NEGATIVE ng/mL
Cocaine Metabolites: NEGATIVE ng/mL
Creatinine, Urine: 125.71 mg/dL (ref >20.0)
ECSTASY: NEGATIVE ng/mL
FENTANYL URINE: NEGATIVE ng/mL
METHAQUALONE SCREEN (URINE): NEGATIVE ng/mL
Meperidine, Ur: NEGATIVE ng/mL
Methadone Screen, Urine: NEGATIVE ng/mL
NITRITES URINE, INITIAL: NEGATIVE ug/mL
OPIATE SCREEN, URINE: NEGATIVE ng/mL
Oxycodone Screen, Ur: NEGATIVE ng/mL
PROPOXYPHENE: NEGATIVE ng/mL
Phencyclidine, Ur: NEGATIVE ng/mL
TAPENTADOLUR: NEGATIVE ng/mL
Tramadol Scrn, Ur: NEGATIVE ng/mL
ZOLPIDEM, URINE: NEGATIVE ng/mL
pH, Initial: 6.3 pH (ref 4.5–8.9)

## 2014-12-25 LAB — CANNABANOIDS (GC/LC/MS), URINE: THC-COOH (GC/LC/MS), ur confirm: 491 ng/mL — AB (ref <5)

## 2014-12-26 ENCOUNTER — Encounter: Payer: Self-pay | Admitting: Family Medicine

## 2014-12-26 DIAGNOSIS — F1290 Cannabis use, unspecified, uncomplicated: Secondary | ICD-10-CM | POA: Insufficient documentation

## 2014-12-27 ENCOUNTER — Telehealth: Payer: Self-pay | Admitting: *Deleted

## 2014-12-27 ENCOUNTER — Encounter: Payer: Self-pay | Admitting: *Deleted

## 2014-12-27 NOTE — Telephone Encounter (Signed)
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 Her employer told her that she can be out of work in order to complete the collection as long as she has a note. I returned pt's call this morning and discussed her situation.  Pt stated that she has started the collection this morning @ 0600 and will not go to work this evening so that she can complete the collection. I advised pt that she will not need to come today as scheduled for BP check because we can check it tomorrow when she brings the 24 hr urine specimen. Pt will also need labs drawn at that time and the requested letter will be given to her then. Pt voiced understanding of all information and instructions.

## 2014-12-28 ENCOUNTER — Ambulatory Visit: Payer: Medicaid Other | Admitting: *Deleted

## 2014-12-28 ENCOUNTER — Other Ambulatory Visit: Payer: Self-pay | Admitting: Obstetrics & Gynecology

## 2014-12-28 VITALS — BP 136/81 | HR 70

## 2014-12-28 DIAGNOSIS — O10911 Unspecified pre-existing hypertension complicating pregnancy, first trimester: Secondary | ICD-10-CM

## 2014-12-28 LAB — CBC
HEMATOCRIT: 31.7 % — AB (ref 36.0–46.0)
HEMOGLOBIN: 10 g/dL — AB (ref 12.0–15.0)
MCH: 24.7 pg — AB (ref 26.0–34.0)
MCHC: 31.5 g/dL (ref 30.0–36.0)
MCV: 78.3 fL (ref 78.0–100.0)
MPV: 9.6 fL (ref 8.6–12.4)
Platelets: 261 10*3/uL (ref 150–400)
RBC: 4.05 MIL/uL (ref 3.87–5.11)
RDW: 15.6 % — ABNORMAL HIGH (ref 11.5–15.5)
WBC: 5.5 10*3/uL (ref 4.0–10.5)

## 2014-12-28 LAB — COMPREHENSIVE METABOLIC PANEL
ALT: 9 U/L (ref 6–29)
AST: 9 U/L — ABNORMAL LOW (ref 10–30)
Albumin: 3.4 g/dL — ABNORMAL LOW (ref 3.6–5.1)
Alkaline Phosphatase: 57 U/L (ref 33–115)
BUN: 6 mg/dL — AB (ref 7–25)
CHLORIDE: 104 mmol/L (ref 98–110)
CO2: 25 mmol/L (ref 20–31)
Calcium: 8.5 mg/dL — ABNORMAL LOW (ref 8.6–10.2)
Creat: 0.5 mg/dL (ref 0.50–1.10)
GLUCOSE: 81 mg/dL (ref 65–99)
POTASSIUM: 4.1 mmol/L (ref 3.5–5.3)
Sodium: 135 mmol/L (ref 135–146)
Total Bilirubin: 0.3 mg/dL (ref 0.2–1.2)
Total Protein: 6.2 g/dL (ref 6.1–8.1)

## 2014-12-28 NOTE — Progress Notes (Signed)
Catherine Simmons here for blood pressure one week after intial prenatal visit and starting labetolol with hx CHTN.Also turning in 24 hour urine and getting labs done.   Reported bp today and history to Dr. Macon LargeAnyanwu. No new orders . Keep next prenatal appointment as scheduled and continue labetolol.

## 2014-12-31 ENCOUNTER — Telehealth: Payer: Self-pay | Admitting: *Deleted

## 2014-12-31 ENCOUNTER — Encounter: Payer: Self-pay | Admitting: *Deleted

## 2014-12-31 ENCOUNTER — Encounter: Payer: Self-pay | Admitting: Obstetrics and Gynecology

## 2014-12-31 DIAGNOSIS — B20 Human immunodeficiency virus [HIV] disease: Secondary | ICD-10-CM

## 2014-12-31 NOTE — Telephone Encounter (Signed)
Pt started on Odefsey by Dr. Ninetta LightsHatcher 11/27/14 after patient found out she was pregnant.  Pt not tolerating the Odefsey.  [facility]  Would like to go back on the Prezcobix and Truvada she took during her last pregnancy.  RN made the pt a follow up appt for 01/23/15 with Dr. Drue SecondSnider.  Dr Drue SecondSnider please advise about medication change.

## 2014-12-31 NOTE — Telephone Encounter (Signed)
error 

## 2015-01-01 LAB — CREATININE CLEARANCE, URINE, 24 HOUR
CREAT CLEAR: 218 mL/min — AB (ref 75–115)
CREATININE 24H UR: 1.57 g/(24.h) (ref 0.63–2.50)
CREATININE, URINE: 98 mg/dL (ref 20–320)
Creatinine: 0.5 mg/dL (ref 0.50–1.10)

## 2015-01-01 LAB — PROTEIN, URINE, 24 HOUR
PROTEIN 24H UR: 96 mg/(24.h) (ref <150)
PROTEIN, URINE: 6 mg/dL (ref 5–24)

## 2015-01-02 NOTE — Telephone Encounter (Signed)
Please switch her back to prezcobix and truvada

## 2015-01-08 MED ORDER — DARUNAVIR-COBICISTAT 800-150 MG PO TABS: 1.0000 | ORAL_TABLET | Freq: Every day | ORAL | Status: DC

## 2015-01-08 MED ORDER — EMTRICITABINE-TENOFOVIR DF 200-300 MG PO TABS: 1.0000 | ORAL_TABLET | Freq: Every day | ORAL | Status: DC

## 2015-01-08 NOTE — Telephone Encounter (Signed)
RN spoke with the pt and advised her to change back to Truvada and Prezcobix per Dr Drue SecondSnider.  Pt has follow up appt with Dr. Drue SecondSnider 01/23/15 @ 3 PM.

## 2015-01-08 NOTE — Addendum Note (Signed)
Addended by: Jennet MaduroESTRIDGE, DENISE D on: 01/08/2015 10:57 AM   Modules accepted: Orders, Medications

## 2015-01-09 NOTE — Telephone Encounter (Signed)
Can you call Catherine Simmons to see how she is feeling. When does she get seen again?

## 2015-01-11 ENCOUNTER — Encounter (HOSPITAL_COMMUNITY): Payer: Self-pay

## 2015-01-11 ENCOUNTER — Ambulatory Visit (HOSPITAL_COMMUNITY)
Admission: RE | Admit: 2015-01-11 | Discharge: 2015-01-11 | Disposition: A | Discharge status: Home / Self Care | Payer: Medicaid Other | Source: Ambulatory Visit | Attending: Family Medicine | Admitting: Family Medicine

## 2015-01-11 VITALS — BP 137/85 | HR 72 | Wt 222.4 lb

## 2015-01-11 DIAGNOSIS — O09891 Supervision of other high risk pregnancies, first trimester: Secondary | ICD-10-CM

## 2015-01-11 DIAGNOSIS — O10011 Pre-existing essential hypertension complicating pregnancy, first trimester: Secondary | ICD-10-CM | POA: Diagnosis not present

## 2015-01-11 DIAGNOSIS — Z3A13 13 weeks gestation of pregnancy: Secondary | ICD-10-CM | POA: Insufficient documentation

## 2015-01-11 DIAGNOSIS — O10919 Unspecified pre-existing hypertension complicating pregnancy, unspecified trimester: Secondary | ICD-10-CM

## 2015-01-11 DIAGNOSIS — O98711 Human immunodeficiency virus [HIV] disease complicating pregnancy, first trimester: Secondary | ICD-10-CM | POA: Insufficient documentation

## 2015-01-11 DIAGNOSIS — O09291 Supervision of pregnancy with other poor reproductive or obstetric history, first trimester: Secondary | ICD-10-CM | POA: Insufficient documentation

## 2015-01-11 DIAGNOSIS — O10911 Unspecified pre-existing hypertension complicating pregnancy, first trimester: Secondary | ICD-10-CM

## 2015-01-11 DIAGNOSIS — O0991 Supervision of high risk pregnancy, unspecified, first trimester: Secondary | ICD-10-CM

## 2015-01-11 DIAGNOSIS — Z369 Encounter for antenatal screening, unspecified: Secondary | ICD-10-CM

## 2015-01-11 IMAGING — US US OB NUCHAL TRANSLUCENCY 1ST GEST
1 series · 13 of 28 positions shown · non-contrast
Comparison: none

[Series 1: us ob nuchal translucency 1st gest · 0.06mm/px · 13 of 32 slices shown]
[im 2/32]
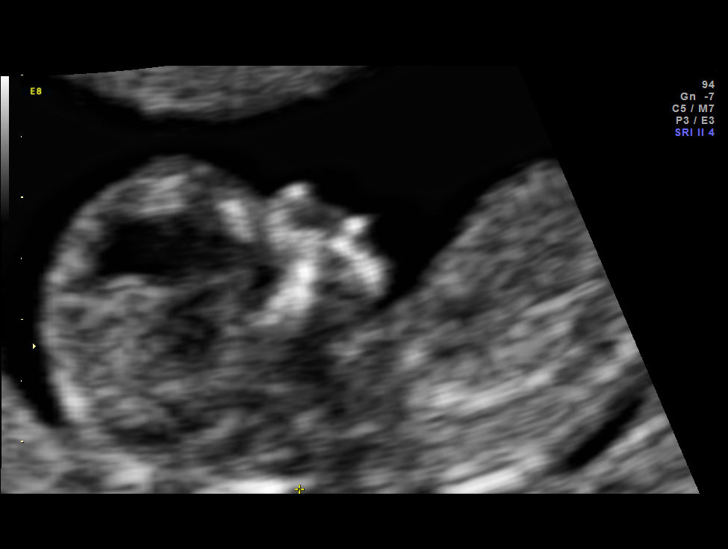
[im 4/32]
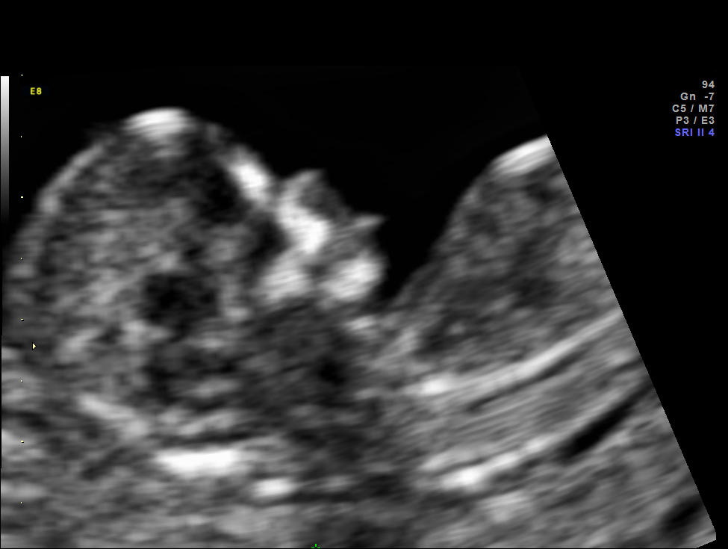
[im 6/32]
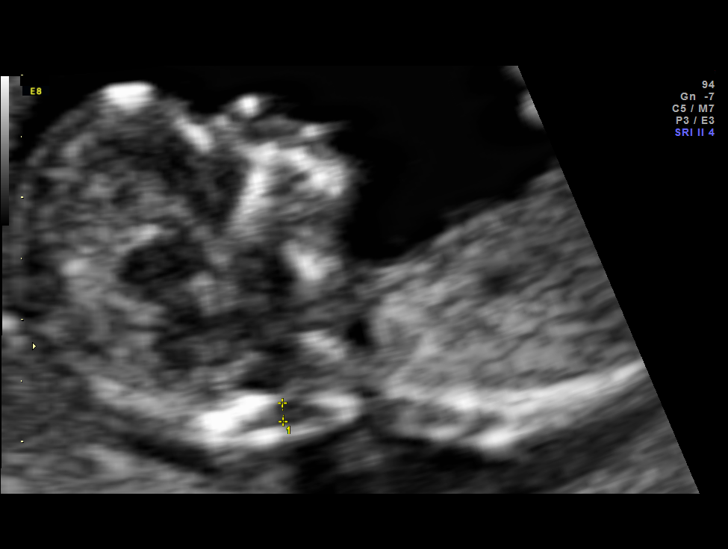
[im 9/32]
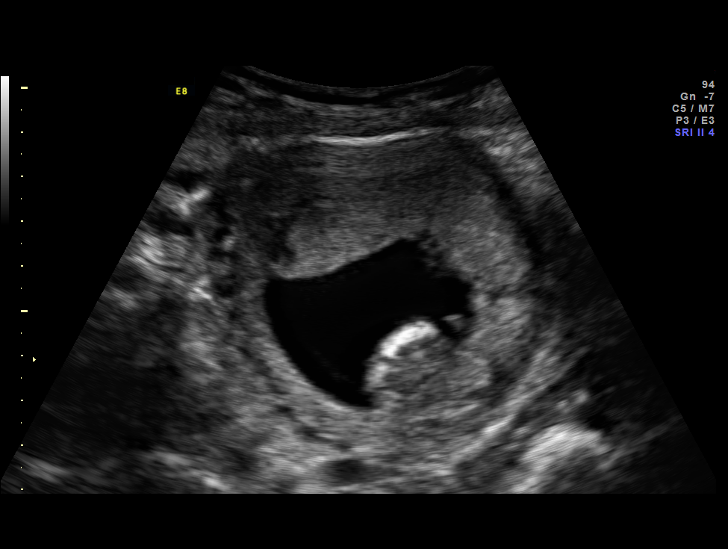
[im 11/32]
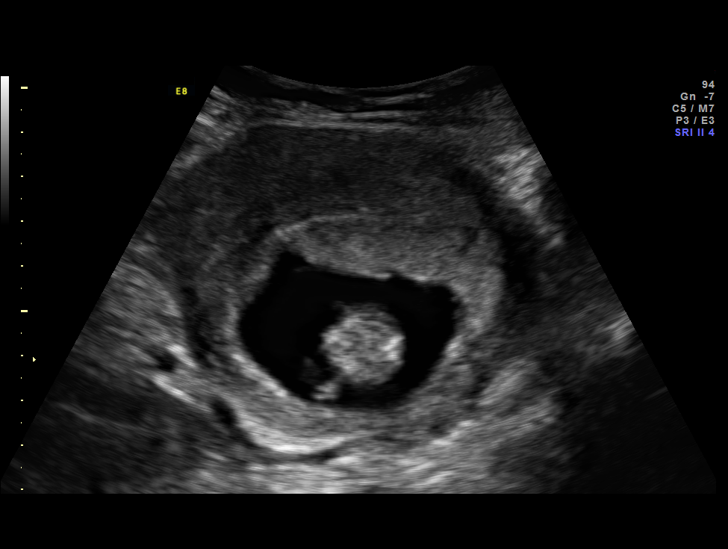
[im 13/32]
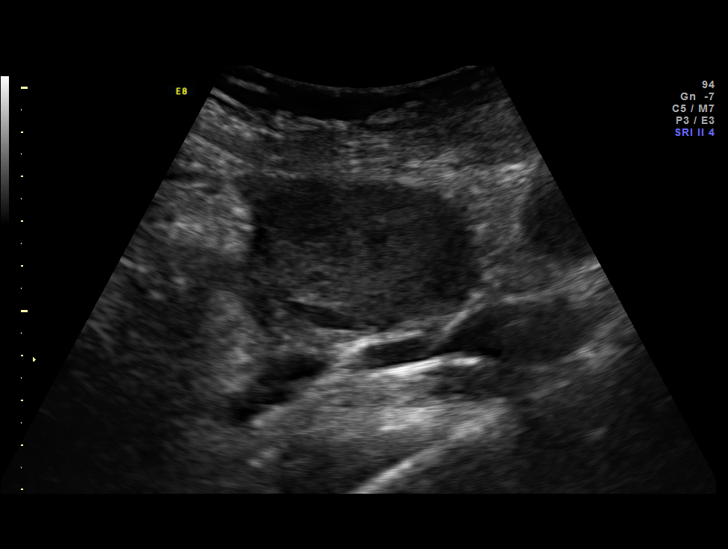
[im 17/32]
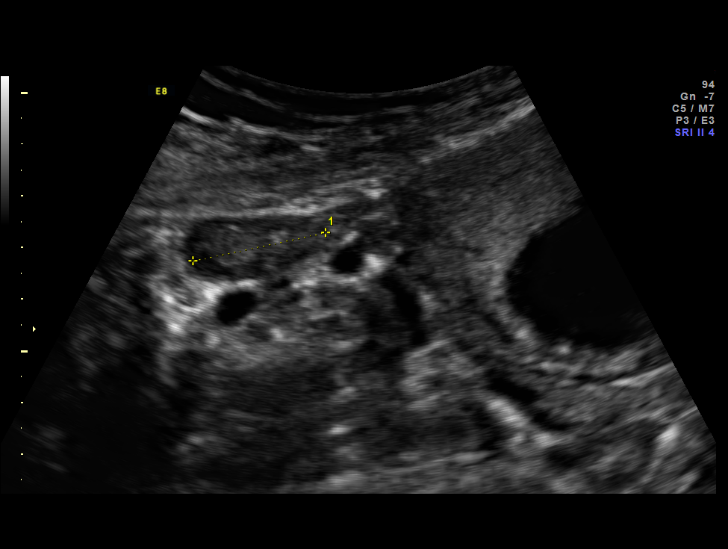
[im 19/32]
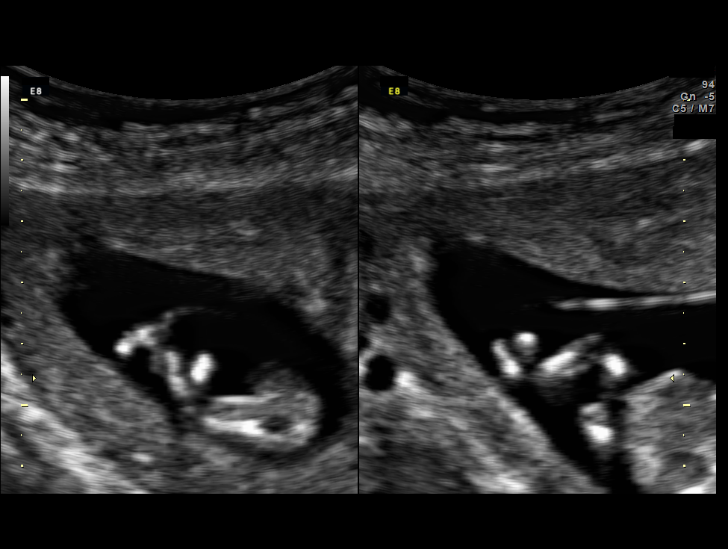
[im 21/32]
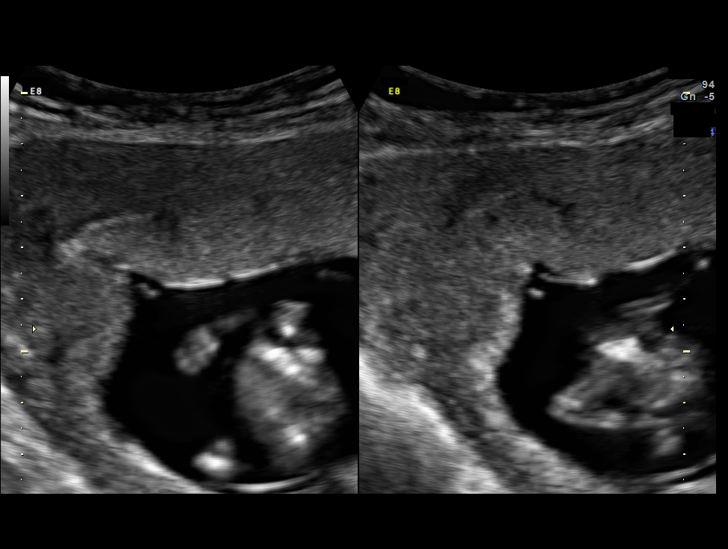
[im 23/32]
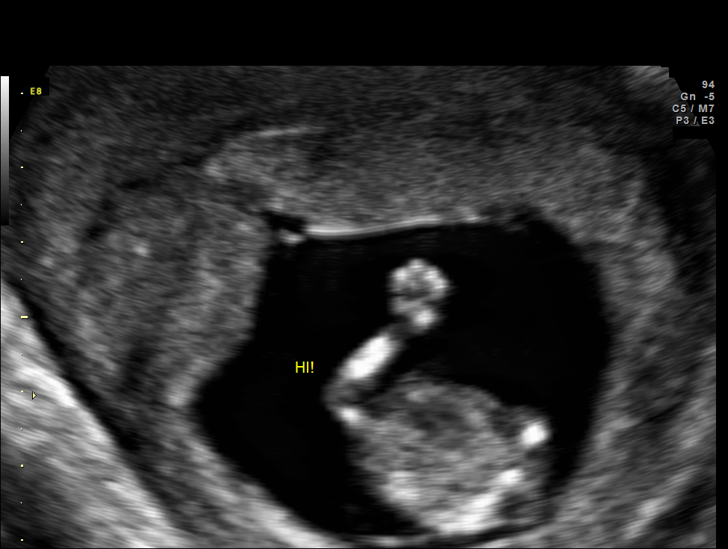
[im 26/32]
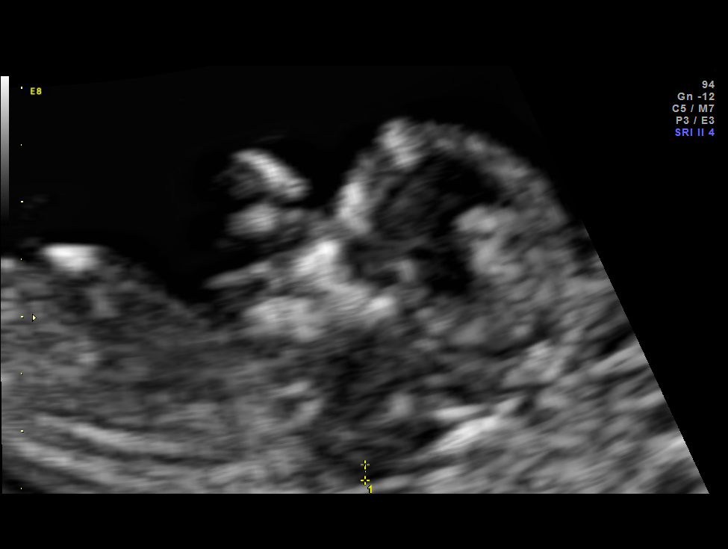
[im 28/32]
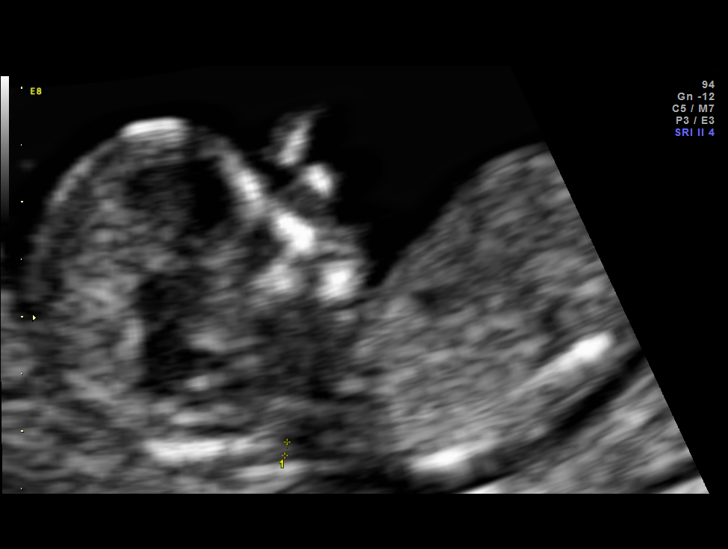
[im 30/32]
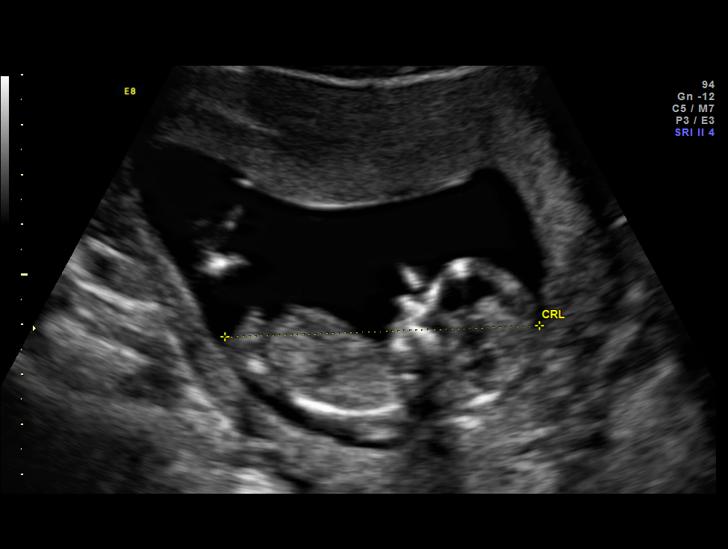

[13 of 28 positions shown; findings below may reference images not displayed]

pm)

Name:       NENO                    Visit  [DATE] [DATE]
Date:

1  US FETAL NUCHAL TRANSLUCENCY                76813.0
MEASUREMENT

Indications

13 weeks gestation of pregnancy
HIV: Asymptomatic                               [EF]
Hypertension - Chronic/Pre-existing (on         [EF]
labetalol)
Poor obstetric history: Previous                [EF]
preeclampsia / eclampsia/gestational HTN
OB History

Gravidity:     4         Term:  3        Prem:    0        SAB:   0
TOP:           0       Ectopic  0        Living:  3
:
Fetal Evaluation

Num Of Fetuses:      1
Preg. Location:      Intrauterine
Fetal Pole:          Visualized
Fetal Heart          142
Rate(bpm):
Cardiac Activity:    Observed
Biometry

CRL:      63.4  mm     G. Age:   12w 4d                  EDD:   [DATE]
Gestational Age

LMP:           13w 0d        Date:  [DATE]                  EDD:   [DATE]
Best:          13w 0d    Det. By:   LMP  ([DATE])           EDD:   [DATE]
1st Trimester Genetic Sonogram Screening

CRL:            63.4  mm     G. Age:   12w 4d                 EDD:   [DATE]
Nuc Trans:       1.5  mm

Nasal Bone:                  Present
Impression

Singleton intrauterine pregnancy at 13 weeks 0 days
geestation with fetal cardiac activity
NT 1.5mm
NB present
Recommendations

Seeing Dr. NENO in consultation today

## 2015-01-11 NOTE — Progress Notes (Signed)
MFM Consultation, Staff Note:  Discussion: 1. HTN: I saw Catherine MccallumMarkita L Simmons  in consultation. During our discussion, I reviewed hypertension as a cause of uteroplacental insufficiency, with increased risk of IUGR, oligohydramnios, and stillbirth. I told her that her hypertension also places her at increased risk for preeclampsia, describing the triad of increased blood pressure, proteinuria, and abnormal edema. Lastly, hypertension (severe range) increases the risk of placental abruption, especially in the setting of superimposed preeclampsia.  I reviewed the essential tenets in the most recent guidelines for management of hypertension in pregnancy in accordance with the American College of Obstetrics and Gynecology expert opinion. We talked about the medical treatment of hypertension in pregnancy. I outlined the different classes of medications, emphasizing that angiotensin enzyme inhibitors and angoitensin receptor blockers are contraindicated, and diuretics are relatively contraindicated. I told her that beta-blockers and calcium channel blockers are commonly used to treat hypertension in pregnancy, and that both are felt to be safe for use in pregnancy. Her dose of labetalol is adequate in maintaining her blood pressure as evidenced by BP of 137/85 today.  I outlined the usual plan of management for hypertension in pregnancy. She should have her blood pressure carefully followed, and her medications adjusted to keep her BP in the target range of around 140-159/80-105 mm/Hg; ie, HTN should not be treated (no medication adjustment) until severe range blood pressures are noted owing to no known renal or cardiac disease in this patient and to be consistent with current guidelines.  I recommend a baseline 24 hour urine for creatinine clearance and total protein along with baseline CBC/LFT/BMP for comparison should clinical signs or symptoms of preeclampsia be noted later in gestation.   Interval growth  will be warranted monthly. Antenatal testing should begin at 32 weeks with twice weekly non-stress tests with weekly amniotic fluid volume measurement.   2. HIV in pregnancy: I explained to the patient the significance of HIV during pregnancy. Pregnancy outcome is usually not altered in patients who are HIV positive, and there is no higher incidence of fetal malformations in these patients, unless there are other risk factors. However, the big risk with HIV positivity during pregnancy is the risk of vertical transmission which occurs in about 15% to 20% of infants born to untreated women. Because the clinical course of HIV in pediatric patients is so fulminant, anti-retroviral therapy in pregnancy is advised to minimize maternal-to-child transmission. Although it confers no maternal advantage, the 076 zidovudine protocol was designed to decrease perinatal infection and has been shown to decrease transmission rates from 25% to 8%. Initiation of this protocol is usually reserved for pregnancies after 14 weeks of gestation.   She is on an excellent regimen of HAART therapy and is well-controlled (VL undetectable and normal LFTs).   Due to slightly increased incidence of LBW/PTB, I recommend precautionary interval growth ultrasounds every 6 weeks. No antenatal testing will be needed unless she develops oligohydramnios, polyhydramnios, IUGR, HTN, GDMA2, or other indication. Likewise, timing of delivery need not be altered unless her VL exceeds 1000 in which instance, I would recommend cesarean at [redacted] weeks gestation.   However, there has been a growing consensus in the Infectious Disease community that pregnant women are entitled to aggressive multi-agent antiretroviral therapy (usually triple therapy) for their own benefit. This is also likely to decrease viral load, and viral load itself appears to be an important predictor of vertical transmission. In patients on effective antiretroviral therapy and delivered  by cesarean section, transmission can be  reduced to the low single digits. Strict adherence to the prescribed medication regimen and the risk of development of drug resistance if medications are inconsistently taken was also discussed.  In patients with viral loads over 1000, the obstetrical community typically recommends delivery by elective cesarean section. However, patients with viral loads below 1000 copies are candidates for vaginal delivery with acceptably low vertical transmission rates. Since the incidence of wound complications appears to be slightly higher in patients with compromised immune systems, a cesarean section is probably best avoided in patients with very low viral loads. Beyond these perinatal considerations, pregnant patients with HIV are, for the most part, managed like nonpregnant individuals with institution of Pneumocystis and other prophylaxis, as indicated by CD4 counts.   Whether vaginal or by cesarean section, the intrapartum management of HIV positive patients should include intravenous zidovudine 2 mg/kg IV given over one hour, followed by continuous infusion of 1 mg/kg per hour until delivery. The patient was informed that the newborn will be maintained on a prophylactic antiretroviral regimen, and that serologic testing in newborns is uninformative, at least initially, because of transplacental transfer of maternal antibodies. Breast feeding should be avoided by patients living in developed countries.  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 Routine OB care, noting that spontaneous labor prior to 41 weeks may be awaited in context of VL <1000, AGA growth, and no additional maternal conditions arising in gestation. 2. Close follow up with ID with VL/CD4 count/LFTs q1-2 months or as per ID Medicine's discretion 3. Continued HAART regimen as per ID medicine HTN Summary of Recommendations: 1. Interval growth q4 weeks by ultrasound beginning at 24 weeks  (scheduled) 2. Preeclamptic precautions given. BP check in office every two weeks until 32 weeks then twice weekly with NSTs at a minimum. 3. 24 hour urine and baseline preeclampsia labs 4. Twice weekly NSTs and weekly AFIs to begin at 32 weeks 5. Delivery at 38-[redacted] weeks gestation assuming HTN is controlled, growth is AGA, testing is reassuring, and preeclampsia or spontaneous preterm labor does not develop in the interim.  Time Spent:  I spent in excess of 40 minutes in consultation with this patient to review records, evaluate her case, and provide her with an adequate discussion and education. More than 50% of this time was spent in direct face-to-face counseling.  It was a pleasure seeing your patient in the office today. Thank you for consultation. Please do not hesitate to contact our service for any further questions.   Thank you, Louann Sjogren Gaynelle Arabian, Louann Sjogren, MD, MS, FACOG Assistant Professor Section of Maternal-Fetal Medicine [facility]

## 2015-01-16 NOTE — Telephone Encounter (Signed)
Vomiting has stopped since medication change, slight nausea.  Follow up appt with Dr. Drue SecondSnider 01/23/15.  Pt reminded of appt next week.  Seeing WOC tomorrow, 01/17/15.

## 2015-01-17 ENCOUNTER — Encounter: Payer: Medicaid Other | Admitting: Obstetrics & Gynecology

## 2015-01-18 ENCOUNTER — Other Ambulatory Visit (HOSPITAL_COMMUNITY): Payer: Self-pay

## 2015-01-23 ENCOUNTER — Encounter: Payer: Self-pay | Admitting: Internal Medicine

## 2015-01-23 ENCOUNTER — Ambulatory Visit (INDEPENDENT_AMBULATORY_CARE_PROVIDER_SITE_OTHER): Payer: Medicaid Other | Admitting: Internal Medicine

## 2015-01-23 VITALS — BP 135/86 | HR 85 | Temp 97.9°F | Wt 221.0 lb

## 2015-01-23 DIAGNOSIS — B20 Human immunodeficiency virus [HIV] disease: Secondary | ICD-10-CM

## 2015-01-23 DIAGNOSIS — O210 Mild hyperemesis gravidarum: Secondary | ICD-10-CM | POA: Diagnosis not present

## 2015-01-23 DIAGNOSIS — O10911 Unspecified pre-existing hypertension complicating pregnancy, first trimester: Secondary | ICD-10-CM

## 2015-01-23 DIAGNOSIS — O0991 Supervision of high risk pregnancy, unspecified, first trimester: Secondary | ICD-10-CM

## 2015-01-23 LAB — COMPLETE METABOLIC PANEL WITH GFR
ALT: 8 U/L (ref 6–29)
AST: 10 U/L (ref 10–30)
Albumin: 3.3 g/dL — ABNORMAL LOW (ref 3.6–5.1)
Alkaline Phosphatase: 66 U/L (ref 33–115)
BUN: 5 mg/dL — AB (ref 7–25)
CHLORIDE: 103 mmol/L (ref 98–110)
CO2: 22 mmol/L (ref 20–31)
CREATININE: 0.45 mg/dL — AB (ref 0.50–1.10)
Calcium: 9.2 mg/dL (ref 8.6–10.2)
GFR, Est Non African American: >89 mL/min (ref >=60)
GLUCOSE: 69 mg/dL (ref 65–99)
Potassium: 3.9 mmol/L (ref 3.5–5.3)
SODIUM: 134 mmol/L — AB (ref 135–146)
Total Bilirubin: 0.3 mg/dL (ref 0.2–1.2)
Total Protein: 6.2 g/dL (ref 6.1–8.1)

## 2015-01-23 LAB — CBC WITH DIFFERENTIAL/PLATELET
BASOS ABS: 0 10*3/uL (ref 0.0–0.1)
BASOS PCT: 0 % (ref 0–1)
EOS ABS: 0.1 10*3/uL (ref 0.0–0.7)
EOS PCT: 1 % (ref 0–5)
HCT: 31.4 % — ABNORMAL LOW (ref 36.0–46.0)
Hemoglobin: 10.3 g/dL — ABNORMAL LOW (ref 12.0–15.0)
Lymphocytes Relative: 33 % (ref 12–46)
Lymphs Abs: 2.2 10*3/uL (ref 0.7–4.0)
MCH: 25.7 pg — ABNORMAL LOW (ref 26.0–34.0)
MCHC: 32.8 g/dL (ref 30.0–36.0)
MCV: 78.3 fL (ref 78.0–100.0)
MPV: 9 fL (ref 8.6–12.4)
Monocytes Absolute: 0.6 10*3/uL (ref 0.1–1.0)
Monocytes Relative: 9 % (ref 3–12)
NEUTROS PCT: 57 % (ref 43–77)
Neutro Abs: 3.8 10*3/uL (ref 1.7–7.7)
PLATELETS: 259 10*3/uL (ref 150–400)
RBC: 4.01 MIL/uL (ref 3.87–5.11)
RDW: 15.2 % (ref 11.5–15.5)
WBC: 6.6 10*3/uL (ref 4.0–10.5)

## 2015-01-23 NOTE — Progress Notes (Signed)
Patient ID: Catherine Simmons, female   DOB: 1985-02-24, 29 y.o.   MRN: [id number]       Patient ID: Catherine Simmons, female   DOB: May 28, 1985, 29 y.o.   MRN: [id number]  HPI 29yo F with HIV disease, who is [redacted] weeks pregnant. When she was dx with pregnancy, she was changed to [facility] where she did not tolerate taking medication with frequent vomiting in setting of her first trimester. She switched back to a previous regimen that she took on her prior pregnancy < 93yrs, currently on prezcobix and truvada. She has not missed any doses since taking prezcobix and truvada. Doing well with adherence. Has some nausea but no longer having vomiting. She takes phenergan as needed. She is seeing MFM due to pre-existing hypertension during pregnancy. She has monthly u/s lined up. Overall she feels better than her last pregnancy which was complicated by significant nausea vomiting into the mid 2nd trimester.  Outpatient Encounter Prescriptions as of 01/23/2015  Medication Sig  . acetaminophen (TYLENOL) 500 MG tablet Take 1,000 mg by mouth every 6 (six) hours as needed for mild pain.  Marland Kitchen darunavir-cobicistat (PREZCOBIX) 800-150 MG tablet Take 1 tablet by mouth daily. Swallow whole. Do NOT crush, break or chew tablets. Take with food.  Marland Kitchen emtricitabine-tenofovir (TRUVADA) 200-300 MG tablet Take 1 tablet by mouth daily.  Marland Kitchen labetalol (NORMODYNE) 200 MG tablet Take 1 tablet (200 mg total) by mouth 2 (two) times daily.  . promethazine (PHENERGAN) 25 MG tablet Take 0.5-1 tablets (12.5-25 mg total) by mouth every 6 (six) hours as needed.   No facility-administered encounter medications on file as of 01/23/2015.     Patient Active Problem List   Diagnosis Date Noted  . Marijuana use 12/26/2014  . Chronic hypertension 03/19/2014  . [redacted] weeks gestation of pregnancy   . Group B Streptococcus carrier, +RV culture, currently pregnant 03/01/2014  . Obesity affecting pregnancy in third trimester, antepartum   . Preexisting  hypertension complicating pregnancy, antepartum   . HIV disease affecting pregnancy, antepartum 11/16/2013  . Supervision of high-risk pregnancy 08/31/2013  . HTN in pregnancy, chronic 08/31/2013  . Hx of pelvic inflammatory disease 08/31/2013  . Anxiety 06/30/2013  . Human immunodeficiency virus (HIV) disease (HCC) 10/12/2012  . Other disorder of menstruation and other abnormal bleeding from female genital tract 10/07/2012  . Pelvic pain 06/03/2012  . CARPAL TUNNEL SYNDROME 11/14/2009  . NUMMULAR ECZEMA 11/14/2009  . OBESITY 05/09/2008  . SMOKER 05/09/2008  . DEPRESSION 05/09/2008     There are no preventive care reminders to display for this patient.   Review of Systems   Constitutional: + fatigue. 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  HENT: Negative for congestion, sore throat, rhinorrhea, sneezing, trouble swallowing and sinus pressure.  Eyes: Negative for photophobia and visual disturbance.  Respiratory: Negative for cough, chest tightness, shortness of breath, wheezing and stridor.  Cardiovascular: Negative for chest pain, palpitations and leg swelling.  Gastrointestinal: Negative for nausea, vomiting, abdominal pain, diarrhea, constipation, blood in stool, abdominal distention and anal bleeding.  Genitourinary: Negative for dysuria, hematuria, flank pain and difficulty urinating.  Musculoskeletal: Negative for myalgias, back pain, joint swelling, arthralgias and gait problem.  Skin: Negative for color change, pallor, rash and wound.  Neurological: Negative for dizziness, tremors, weakness and light-headedness.  Hematological: Negative for adenopathy. Does not bruise/bleed easily.  Psychiatric/Behavioral: Negative for behavioral problems, confusion, sleep disturbance, dysphoric mood, decreased concentration and agitation.    Physical  Exam   BP 135/86 mmHg  Pulse 85  Temp(Src) 97.9 F (36.6 C) (Oral)  Wt 221 lb (100.245 kg)  LMP 10/12/2014 Physical  Exam  Constitutional:  oriented to person, place, and time. appears well-developed and well-nourished. No distress.  HENT: [place]/AT, PERRLA, no scleral icterus Mouth/Throat: Oropharynx is clear and moist. No oropharyngeal exudate.  Cardiovascular: Normal rate, regular rhythm and normal heart sounds. Exam reveals no gallop and no friction rub.  No murmur heard.  Pulmonary/Chest: Effort normal and breath sounds normal. No respiratory distress.  has no wheezes.  Neck = supple, no nuchal rigidity Abdominal: Soft. Bowel sounds are normal.  exhibits no distension. There is no tenderness.  Lymphadenopathy: no cervical adenopathy. No axillary adenopathy Neurological: alert and oriented to person, place, and time.  Skin: Skin is warm and dry. No rash noted. No erythema.  Psychiatric: a normal mood and affect.  behavior is normal.   Lab Results  Component Value Date   CD4TCELL 38 11/01/2014   Lab Results  Component Value Date   CD4TABS 1000 11/01/2014   CD4TABS 1016 04/09/2014   CD4TABS 776 02/19/2014   Lab Results  Component Value Date   HIV1RNAQUANT <20 11/01/2014   Lab Results  Component Value Date   HEPBSAB POS* 10/20/2012   No results found for: RPR  CBC Lab Results  Component Value Date   WBC 5.5 12/28/2014   RBC 4.05 12/28/2014   HGB 10.0* 12/28/2014   HCT 31.7* 12/28/2014   PLT 261 12/28/2014   MCV 78.3 12/28/2014   MCH 24.7* 12/28/2014   MCHC 31.5 12/28/2014   RDW 15.6* 12/28/2014   LYMPHSABS 2.0 12/20/2014   MONOABS 0.6 12/20/2014   EOSABS 0.1 12/20/2014   BASOSABS 0.0 12/20/2014   BMET Lab Results  Component Value Date   NA 135 12/28/2014   K 4.1 12/28/2014   CL 104 12/28/2014   CO2 25 12/28/2014   GLUCOSE 81 12/28/2014   BUN 6* 12/28/2014   CREATININE 0.50 12/28/2014   CALCIUM 8.5* 12/28/2014   GFRNONAA >60 11/08/2014   GFRAA >60 11/08/2014     Assessment and Plan  1st trimester pregnancy = recommend that she continues on prenatal vitamins and folic  acid supplementation  Nausea/vomiting = she has hx of having significant N/V throughout her last pregnancy. I am hoping that she still remains undetectable but concern that she didn't have proper absorption. Continue to use phenergan and can switch to zofran if needed  hiv disease = continue truvada/prezcobix. We will check cd 4 count and viral load to ensure still undetectable.  htn = continue with labetelol

## 2015-01-24 ENCOUNTER — Encounter: Payer: Medicaid Other | Admitting: Obstetrics & Gynecology

## 2015-01-24 LAB — T-HELPER CELL (CD4) - (RCID CLINIC ONLY)
CD4 T CELL ABS: 890 /uL (ref 400–2700)
CD4 T CELL HELPER: 39 % (ref 33–55)

## 2015-01-24 LAB — HIV-1 RNA QUANT-NO REFLEX-BLD
HIV 1 RNA Quant: 691 copies/mL — ABNORMAL HIGH (ref <20)
HIV-1 RNA QUANT, LOG: 2.84 {Log_copies}/mL — AB (ref <1.30)

## 2015-01-28 ENCOUNTER — Other Ambulatory Visit: Payer: Medicaid Other

## 2015-01-28 ENCOUNTER — Encounter: Payer: Self-pay | Admitting: Family Medicine

## 2015-01-28 ENCOUNTER — Ambulatory Visit (INDEPENDENT_AMBULATORY_CARE_PROVIDER_SITE_OTHER): Payer: Medicaid Other | Admitting: Family Medicine

## 2015-01-28 ENCOUNTER — Telehealth: Payer: Self-pay | Admitting: *Deleted

## 2015-01-28 VITALS — BP 132/83 | HR 78 | Temp 98.5°F | Wt 221.7 lb

## 2015-01-28 DIAGNOSIS — O10912 Unspecified pre-existing hypertension complicating pregnancy, second trimester: Secondary | ICD-10-CM | POA: Diagnosis present

## 2015-01-28 DIAGNOSIS — O10919 Unspecified pre-existing hypertension complicating pregnancy, unspecified trimester: Secondary | ICD-10-CM

## 2015-01-28 DIAGNOSIS — O0992 Supervision of high risk pregnancy, unspecified, second trimester: Secondary | ICD-10-CM

## 2015-01-28 LAB — POCT URINALYSIS DIP (DEVICE)
BILIRUBIN URINE: NEGATIVE
GLUCOSE, UA: NEGATIVE mg/dL
Hgb urine dipstick: NEGATIVE
KETONES UR: 15 mg/dL — AB
Nitrite: NEGATIVE
Protein, ur: NEGATIVE mg/dL
SPECIFIC GRAVITY, URINE: 1.02 (ref 1.005–1.030)
UROBILINOGEN UA: 1 mg/dL (ref 0.0–1.0)
pH: 7 (ref 5.0–8.0)

## 2015-01-28 MED ORDER — ASPIRIN 81 MG PO CHEW: 81.0000 mg | CHEWABLE_TABLET | Freq: Every day | ORAL | Status: DC

## 2015-01-28 NOTE — Progress Notes (Signed)
Subjective:  Catherine Simmons is a 29 y.o. [phone] at 8662w3d being seen today for ongoing prenatal care.  She is currently monitored for the following issues for this high-risk pregnancy and has OBESITY; SMOKER; DEPRESSION; CARPAL TUNNEL SYNDROME; NUMMULAR ECZEMA; Human immunodeficiency virus (HIV) disease (HCC); Anxiety; Supervision of high-risk pregnancy; Hx of pelvic inflammatory disease; HIV disease affecting pregnancy, antepartum; Preexisting hypertension complicating pregnancy, antepartum; Obesity affecting pregnancy in third trimester, antepartum; Chronic hypertension; and Marijuana use on her problem list.  Patient reports no complaints.  Contractions: Not present. Vag. Bleeding: None.  Movement: Absent. Denies leaking of fluid.   The following portions of the patient's history were reviewed and updated as appropriate: allergies, current medications, past family history, past medical history, past social history, past surgical history and problem list. Problem list updated.  Objective:   Filed Vitals:   01/28/15 1148  BP: 132/83  Pulse: 78  Temp: 98.5 F (36.9 C)  Weight: 221 lb 11.2 oz (100.562 kg)    Fetal Status: Fetal Heart Rate (bpm): 148   Movement: Absent     General:  Alert, oriented and cooperative. Patient is in no acute distress.  Skin: Skin is warm and dry. No rash noted.   Cardiovascular: Normal heart rate noted  Respiratory: Normal respiratory effort, no problems with respiration noted  Abdomen: Soft, gravid, appropriate for gestational age. Pain/Pressure: Present     Pelvic: Vag. Bleeding: None     Cervical exam deferred        Extremities: Normal range of motion.  Edema: None  Mental Status: Normal mood and affect. Normal behavior. Normal judgment and thought content.   Urinalysis: Urine Protein: Negative Urine Glucose: Negative  Assessment and Plan:  Pregnancy: G4P3003 at 7462w3d  1. Supervision of high-risk pregnancy, second trimester Continue routine  prenatal care. - Alpha fetoprotein, maternal  2. Preexisting hypertension complicating pregnancy, antepartum, second trimester BP is much improved on Labetalol Normal baseline labs - aspirin 81 MG chewable tablet; Chew 1 tablet (81 mg total) by mouth daily.  Dispense: 90 tablet; Refill: 3  Please refer to After Visit Summary for other counseling recommendations.  Return in 4 weeks (on 02/25/2015).   Reva Boresanya S Kayde Atkerson, MD

## 2015-01-28 NOTE — Patient Instructions (Signed)

## 2015-01-28 NOTE — Telephone Encounter (Signed)
Per Dr Drue SecondSnider called the patient to have her come in for the additional testing. She is coming today.

## 2015-01-28 NOTE — Telephone Encounter (Signed)
-----   Message from Judyann Munsonynthia Snider, MD sent at 01/25/2015  3:47 PM EST ----- She is detectable! Can we add on a phenosense on her. She had horrible vomiting in her first trimester. Just worried she is resistant to her regimen

## 2015-01-28 NOTE — Progress Notes (Signed)
Urine: 50 ketones, Trace wbcs

## 2015-01-30 LAB — ALPHA FETOPROTEIN, MATERNAL
AFP: 18.7 ng/mL
Curr Gest Age: 15.3 wks.days
MOM FOR AFP: 0.82
OPEN SPINA BIFIDA: NEGATIVE
Osb Risk: 1:25300 {titer}

## 2015-02-06 ENCOUNTER — Telehealth: Payer: Self-pay | Admitting: *Deleted

## 2015-02-06 ENCOUNTER — Other Ambulatory Visit: Payer: Self-pay | Admitting: *Deleted

## 2015-02-06 MED ORDER — ONDANSETRON 8 MG PO TBDP: 8.0000 mg | ORAL_TABLET | Freq: Three times a day (TID) | ORAL | Status: DC | PRN

## 2015-02-06 NOTE — Telephone Encounter (Signed)
Please send in zofran 8mg  ODT #30 q 8 hr with 3 refills

## 2015-02-06 NOTE — Telephone Encounter (Signed)
Patient called requesting to be changed from promethazine to zofran. She states the promethazine is not working and she can not stop vomiting. Wendall MolaJacqueline Cockerham

## 2015-02-06 NOTE — Telephone Encounter (Signed)
[facility] and patient notified.

## 2015-02-08 ENCOUNTER — Inpatient Hospital Stay (HOSPITAL_COMMUNITY)
Admission: AD | Admit: 2015-02-08 | Discharge: 2015-02-08 | Disposition: A | Discharge status: Home / Self Care | Payer: Medicaid Other | Source: Ambulatory Visit | Attending: Obstetrics & Gynecology | Admitting: Obstetrics & Gynecology

## 2015-02-08 DIAGNOSIS — Z3A17 17 weeks gestation of pregnancy: Secondary | ICD-10-CM | POA: Insufficient documentation

## 2015-02-08 DIAGNOSIS — Z7982 Long term (current) use of aspirin: Secondary | ICD-10-CM | POA: Insufficient documentation

## 2015-02-08 DIAGNOSIS — Z888 Allergy status to other drugs, medicaments and biological substances status: Secondary | ICD-10-CM | POA: Diagnosis not present

## 2015-02-08 DIAGNOSIS — H578 Other specified disorders of eye and adnexa: Secondary | ICD-10-CM | POA: Diagnosis not present

## 2015-02-08 DIAGNOSIS — D573 Sickle-cell trait: Secondary | ICD-10-CM | POA: Insufficient documentation

## 2015-02-08 DIAGNOSIS — O9989 Other specified diseases and conditions complicating pregnancy, childbirth and the puerperium: Secondary | ICD-10-CM | POA: Diagnosis present

## 2015-02-08 DIAGNOSIS — Z87891 Personal history of nicotine dependence: Secondary | ICD-10-CM | POA: Diagnosis not present

## 2015-02-08 DIAGNOSIS — I10 Essential (primary) hypertension: Secondary | ICD-10-CM | POA: Insufficient documentation

## 2015-02-08 DIAGNOSIS — R51 Headache: Secondary | ICD-10-CM | POA: Diagnosis not present

## 2015-02-08 DIAGNOSIS — H20011 Primary iridocyclitis, right eye: Secondary | ICD-10-CM

## 2015-02-08 DIAGNOSIS — H209 Unspecified iridocyclitis: Secondary | ICD-10-CM | POA: Insufficient documentation

## 2015-02-08 MED ORDER — SULFACETAMIDE-PREDNISOLONE 10-0.23 % OP SOLN: 1.0000 [drp] | OPHTHALMIC | Status: DC

## 2015-02-08 NOTE — MAU Note (Signed)
Onset of right eye pain and redness with headache

## 2015-02-08 NOTE — MAU Note (Signed)
Urine in lab 

## 2015-02-08 NOTE — Discharge Instructions (Signed)
How to Use Eye Drops and Eye Ointments HOW TO APPLY EYE DROPS Follow these steps when applying eye drops:  Wash your hands.  Tilt your head back.  Put a finger under your eye and use it to gently pull your lower lid downward. Keep that finger in place.  Using your other hand, hold the dropper between your thumb and index finger.  Position the dropper just over the edge of the lower lid. Hold it as close to your eye as you can without touching the dropper to your eye.  Steady your hand. One way to do this is to lean your index finger against your brow.  Look up.  Slowly and gently squeeze one drop of medicine into your eye.  Close your eye.  Place a finger between your lower eyelid and your nose. Press gently for 2 minutes. This increases the amount of time that the medicine is exposed to the eye. It also reduces side effects that can develop if the drop gets into the bloodstream through the nose. HOW TO APPLY EYE OINTMENTS Follow these steps when applying eye ointments:  Wash your hands.  Put a finger under your eye and use it to gently pull your lower lid downward. Keep that finger in place.  Using your other hand, place the tip of the tube between your thumb and index finger with the remaining fingers braced against your cheek or nose.  Hold the tube just over the edge of your lower lid without touching the tube to your lid or eyeball.  Look up.  Line the inner part of your lower lid with ointment.  Gently pull up on your upper lid and look down. This will force the ointment to spread over the surface of the eye.  Release the upper lid.  If you can, close your eyes for 1-2 minutes. Do not rub your eyes. If you applied the ointment correctly, your vision will be blurry for a few minutes. This is normal. ADDITIONAL INFORMATION  Make sure to use the eye drops or ointment as told by your health care provider.  If you have been told to use both eye drops and an eye  ointment, apply the eye drops first, then wait 3-4 minutes before you apply the ointment.  Try not to touch the tip of the dropper or tube to your eye. A dropper or tube that has touched the eye can become contaminated.   This information is not intended to replace advice given to you by your health care provider. Make sure you discuss any questions you have with your health care provider.   Document Released: 05/04/2000 Document Revised: 06/12/2014 Document Reviewed: 01/22/2014 Elsevier Interactive Patient Education 2016 [employer] Inc.  Uveitis Uveitis is the swelling and irritation in the eye. Most of the time, it affects the middle part of the eye (uvea). There are different types uveitis:  Iritis. This type affects the colored part of the eye.  Intermediate uveitis. This type affects the middle of the eye.  Posterior uveitis. This type affects the back of the eye.  Panuveitis. This type affects all layers of the eye. HOME CARE  Take medicines only as told by your doctor. These include over-the-counter medicines and prescription medicines.  Follow instructions from your doctor about what activities are safe for you.  Do not use any tobacco products. These include cigarettes, chewing tobacco, and e-cigarettes. If you need help quitting, ask your doctor.  Keep all follow-up visits as told by your  doctor. This is important. GET HELP IF:  Your medicines are not working. GET HELP RIGHT AWAY IF:  You have redness in one eye or both eyes.  Light bothers your eyes a lot.  You have pain in your eye.  You have aching in your eye.  You cannot see as much as you did before.   This information is not intended to replace advice given to you by your health care provider. Make sure you discuss any questions you have with your health care provider.   Document Released: 06/12/2014 Document Reviewed: 06/12/2014 Elsevier Interactive Patient Education [employer]2016 [employer].

## 2015-02-08 NOTE — MAU Provider Note (Signed)
History     CSN: [id number]  Arrival date and time: 02/08/15 1658   First Provider Initiated Contact with Patient 02/08/15 1720      Chief Complaint  Patient presents with  . Eye Pain  . Headache   HPI Catherine Simmons 29 y.o. [id number] @[redacted]w[redacted]d  presents to MAU with eye redness and pain.  Pt awoke with it 3 days ago.  She has blurry vision in that eye.  There is excess tear production.  It feels like a stabbing pain that is constant.  Bright light makes it worse.  There is no mucus or matting of eye.  It is pain and not itching.  No one else has this.  She also has frequent HAs and sees spots.  Denies fever. She does feel good fetal movement.  There is no vaginal discharge, bleeding, LOF, contractions, dysuria.   OB History    Gravida Para Term Preterm AB TAB SAB Ectopic Multiple Living   4 3 3  0     0 3      Past Medical History  Diagnosis Date  . Cholecystitis 07/16/10  . Hypertension   . HIV (human immunodeficiency virus infection) (HCC)   . Genital warts 2004  . Sickle cell trait [facility](HCC)     Past Surgical History  Procedure Laterality Date  . Cholecystectomy    . Wisdom tooth extraction      Family History  Problem Relation Age of Onset  . Cancer Mother   . Diabetes Maternal Aunt     Social History  Substance Use Topics  . Smoking status: Former Smoker    Start date: 03/11/2012    Quit date: 08/24/2012  . Smokeless tobacco: Never Used  . Alcohol Use: No    Allergies:  Allergies  Allergen Reactions  . Stadol [Butorphanol Tartrate] Itching    Prescriptions prior to admission  Medication Sig Dispense Refill Last Dose  . acetaminophen (TYLENOL) 500 MG tablet Take 1,000 mg by mouth every 6 (six) hours as needed for mild pain.   Taking  . aspirin 81 MG chewable tablet Chew 1 tablet (81 mg total) by mouth daily. 90 tablet 3   . darunavir-cobicistat (PREZCOBIX) 800-150 MG tablet Take 1 tablet by mouth daily. Swallow whole. Do NOT crush, break or chew tablets.  Take with food. 30 tablet 5 Taking  . emtricitabine-tenofovir (TRUVADA) 200-300 MG tablet Take 1 tablet by mouth daily. 30 tablet 5 Taking  . labetalol (NORMODYNE) 200 MG tablet Take 1 tablet (200 mg total) by mouth 2 (two) times daily. 60 tablet 3 Taking  . ondansetron (ZOFRAN-ODT) 8 MG disintegrating tablet Take 1 tablet (8 mg total) by mouth every 8 (eight) hours as needed for nausea or vomiting. 30 tablet 0   . promethazine (PHENERGAN) 25 MG tablet Take 0.5-1 tablets (12.5-25 mg total) by mouth every 6 (six) hours as needed. 30 tablet 2 Taking    ROS Pertinent ROS in HPI.  All other systems are negative.   Physical Exam   Blood pressure 129/79, pulse 102, temperature 98.3 F (36.8 C), temperature source Oral, resp. rate 18, last menstrual period 10/12/2014, not currently breastfeeding.  Physical Exam  Constitutional: She is oriented to person, place, and time. She appears well-developed and well-nourished. No distress.  HENT:  Head: Normocephalic and atraumatic.  Eyes: EOM are normal.  Right conjunctiva with significant erythema.  No ulcerations or discharge visible.  Eye exam difficult as pt cannot stand the light.  She is able to move  both eyes well with lids closed.    Neck: Normal range of motion. Neck supple.  Respiratory: Effort normal. No respiratory distress.  Lymphadenopathy:    She has no cervical adenopathy.  Neurological: She is alert and oriented to person, place, and time.  Skin: Skin is warm and dry.  Psychiatric: She has a normal mood and affect. Her behavior is normal.    MAU Course  Procedures  MDM Consulted with Dr. Emelda Fear.  He is down to see pt and confirms diagnosis of iritis. MD advises for sulfacetamide prednisolone drops and return to MAU at 9am on 02/10/15.  Assessment and Plan  A:  1. Iritis, primary, acute/subacute, right    P: Discharge to home Sulfacetamide prednisolone eye drops Return to MAU on 1/1 at 9am.  Patient may return to MAU as  needed or if her condition were to change or worsen   Bertram Denver 02/08/2015, 5:21 PM

## 2015-02-10 ENCOUNTER — Telehealth: Payer: Self-pay | Admitting: Obstetrics and Gynecology

## 2015-02-10 NOTE — L&D Delivery Note (Signed)
Delivery Note  Patient presented for IOL for superimposed severe preeclampsia. She was augmented with Foley, cytotec, and pitocin. Very rapid active and second stage.   At 2:45 AM a viable female was delivered via  (Presentation: OP ).  APGAR: 9/9 ; weight 2639 g.   Placenta status: intact .  Cord: 3 vessel.  with the following complications: none .  Cord pH: not obtained  Anesthesia:  none Episiotomy:  none Lacerations:  none Est. Blood Loss (mL):  350  Mom to postpartum.  Baby to Couplet care / Skin to Skin.  Catherine Simmons 07/06/2015, 2:56 AM

## 2015-02-10 NOTE — Telephone Encounter (Signed)
No answer

## 2015-02-11 ENCOUNTER — Encounter (HOSPITAL_COMMUNITY): Payer: Self-pay

## 2015-02-11 ENCOUNTER — Inpatient Hospital Stay (HOSPITAL_COMMUNITY)
Admission: AD | Admit: 2015-02-11 | Discharge: 2015-02-11 | Disposition: A | Discharge status: Home / Self Care | Payer: Medicaid Other | Source: Ambulatory Visit | Attending: Family Medicine | Admitting: Family Medicine

## 2015-02-11 DIAGNOSIS — O9989 Other specified diseases and conditions complicating pregnancy, childbirth and the puerperium: Secondary | ICD-10-CM

## 2015-02-11 DIAGNOSIS — Z7982 Long term (current) use of aspirin: Secondary | ICD-10-CM | POA: Insufficient documentation

## 2015-02-11 DIAGNOSIS — F1290 Cannabis use, unspecified, uncomplicated: Secondary | ICD-10-CM | POA: Insufficient documentation

## 2015-02-11 DIAGNOSIS — I10 Essential (primary) hypertension: Secondary | ICD-10-CM | POA: Insufficient documentation

## 2015-02-11 DIAGNOSIS — D573 Sickle-cell trait: Secondary | ICD-10-CM | POA: Insufficient documentation

## 2015-02-11 DIAGNOSIS — Z87891 Personal history of nicotine dependence: Secondary | ICD-10-CM | POA: Insufficient documentation

## 2015-02-11 DIAGNOSIS — Z79899 Other long term (current) drug therapy: Secondary | ICD-10-CM | POA: Diagnosis not present

## 2015-02-11 DIAGNOSIS — L02225 Furuncle of perineum: Secondary | ICD-10-CM | POA: Diagnosis not present

## 2015-02-11 DIAGNOSIS — B20 Human immunodeficiency virus [HIV] disease: Secondary | ICD-10-CM | POA: Insufficient documentation

## 2015-02-11 DIAGNOSIS — L0292 Furuncle, unspecified: Secondary | ICD-10-CM

## 2015-02-11 DIAGNOSIS — N949 Unspecified condition associated with female genital organs and menstrual cycle: Secondary | ICD-10-CM | POA: Diagnosis not present

## 2015-02-11 DIAGNOSIS — Z3A17 17 weeks gestation of pregnancy: Secondary | ICD-10-CM | POA: Insufficient documentation

## 2015-02-11 LAB — URINALYSIS, ROUTINE W REFLEX MICROSCOPIC
BILIRUBIN URINE: NEGATIVE
GLUCOSE, UA: NEGATIVE mg/dL
Hgb urine dipstick: NEGATIVE
Ketones, ur: NEGATIVE mg/dL
Leukocytes, UA: NEGATIVE
NITRITE: NEGATIVE
PH: 6 (ref 5.0–8.0)
Protein, ur: NEGATIVE mg/dL
SPECIFIC GRAVITY, URINE: 1.025 (ref 1.005–1.030)

## 2015-02-11 MED ORDER — SULFAMETHOXAZOLE-TRIMETHOPRIM 800-160 MG PO TABS: 1.0000 | ORAL_TABLET | Freq: Two times a day (BID) | ORAL | Status: DC

## 2015-02-11 NOTE — MAU Provider Note (Signed)
Chief Complaint:  vaginal bumps  and Abdominal Pain   First Provider Initiated Contact with Patient 02/11/15 1009     HPI  Catherine Simmons is a 30 y.o. G4P3003 at 1617w3dwho presents to maternity admissions reporting "bumps down there" since Friday. One got larger and one opened and drained.  Has had other "hair bumps when I was shaving" in past. No hx HSV. Also has some sharp pains in lower pelvis.  She reports no vaginal bleeding, vaginal itching/burning, urinary symptoms, h/a, dizziness, n/v, or fever/chills.    RN Note: Pt C/O large bumps near her vagina since Friday, has become large, one had green discharge. Also C/O stabbing pain in lower abd since last night. Denies bleeding or discharge.        Past Medical History: Past Medical History  Diagnosis Date  . Cholecystitis 07/16/10  . Hypertension   . HIV (human immunodeficiency virus infection) (HCC)   . Genital warts 2004  . Sickle cell trait (HCC)     Past obstetric history: OB History  Gravida Para Term Preterm AB SAB TAB Ectopic Multiple Living  4 3 3  0     0 3    # Outcome Date GA Lbr Len/2nd Weight Sex Delivery Anes PTL Lv  4 Current           3 Term 03/20/14 5948w6d 02:01 / 02:04 7 lb 6.5 oz (3.36 kg) M Vag-Spont EPI  Y  2 Term 02/12/10 3834w0d  7 lb 4 oz (3.289 kg) M Vag-Spont EPI N Y  1 Term 06/02/04 7637w0d  7 lb 1 oz (3.204 kg) F Vag-Spont EPI Y Y      Past Surgical History: Past Surgical History  Procedure Laterality Date  . Cholecystectomy    . Wisdom tooth extraction      Family History: Family History  Problem Relation Age of Onset  . Cancer Mother   . Diabetes Maternal Aunt     Social History: Social History  Substance Use Topics  . Smoking status: Former Smoker    Start date: 03/11/2012    Quit date: 08/24/2012  . Smokeless tobacco: Never Used  . Alcohol Use: No    Allergies:  Allergies  Allergen Reactions  . Stadol [Butorphanol Tartrate] Itching    Meds:  Prescriptions prior to  admission  Medication Sig Dispense Refill Last Dose  . acetaminophen (TYLENOL) 500 MG tablet Take 1,000 mg by mouth every 6 (six) hours as needed for mild pain.   Taking  . aspirin 81 MG chewable tablet Chew 1 tablet (81 mg total) by mouth daily. 90 tablet 3   . darunavir-cobicistat (PREZCOBIX) 800-150 MG tablet Take 1 tablet by mouth daily. Swallow whole. Do NOT crush, break or chew tablets. Take with food. 30 tablet 5 Taking  . emtricitabine-tenofovir (TRUVADA) 200-300 MG tablet Take 1 tablet by mouth daily. 30 tablet 5 Taking  . labetalol (NORMODYNE) 200 MG tablet Take 1 tablet (200 mg total) by mouth 2 (two) times daily. 60 tablet 3 Taking  . ondansetron (ZOFRAN-ODT) 8 MG disintegrating tablet Take 1 tablet (8 mg total) by mouth every 8 (eight) hours as needed for nausea or vomiting. 30 tablet 0   . promethazine (PHENERGAN) 25 MG tablet Take 0.5-1 tablets (12.5-25 mg total) by mouth every 6 (six) hours as needed. 30 tablet 2 Taking  . sulfacetamide-prednisoLONE (VASOCIDIN) 10-0.23 % ophthalmic solution Place 1 drop into the right eye every 3 (three) hours while awake. 5 mL 0  ROS:  Review of Systems  Constitutional: Negative for fever and chills.  Eyes: Positive for pain (in right eye, as before, but improving).  Gastrointestinal: Negative for nausea, vomiting, diarrhea and constipation.  Genitourinary: Positive for pelvic pain. Negative for dysuria, flank pain, vaginal bleeding and vaginal discharge.  Other systems negative  I have reviewed patient's Past Medical Hx, Surgical Hx, Family Hx, Social Hx, medications and allergies.   Physical Exam  Patient Vitals for the past 24 hrs:  BP Temp Temp src Pulse Resp  02/11/15 0944 143/88 mmHg 98 F (36.7 C) Oral 74 18   Constitutional: Well-developed, well-nourished female in no acute distress.  Cardiovascular: normal rate and rhythm Respiratory: normal effort, no distress GI: Abd soft, non-tender except lower abdomen.  Nondistended.   No rebound, No guarding.  Bowel Sounds audible  MS: Extremities nontender, no edema, normal ROM Neurologic: Alert and oriented x 4.   Grossly nonfocal. GU: Neg CVAT. Skin:  Warm and Dry Psych:  Affect appropriate.  PELVIC EXAM: Several pustular lesions on perineum, one draining  >>  HSV culture obtained                           Several healed furuncles on mons pubis and lower perineum/inside buttocks Bimanual exam: Cervix firm, anterior, neg CMT, uterus nontender, adnexa moderately tender, enlargement, or mass      Labs: Results for orders placed or performed during the hospital encounter of 02/11/15 (from the past 24 hour(s))  Urinalysis, Routine w reflex microscopic (not at [facility])     Status: None   Collection Time: 02/11/15  9:50 AM  Result Value Ref Range   Color, Urine YELLOW YELLOW   APPearance CLEAR CLEAR   Specific Gravity, Urine 1.025 1.005 - 1.030   pH 6.0 5.0 - 8.0   Glucose, UA NEGATIVE NEGATIVE mg/dL   Hgb urine dipstick NEGATIVE NEGATIVE   Bilirubin Urine NEGATIVE NEGATIVE   Ketones, ur NEGATIVE NEGATIVE mg/dL   Protein, ur NEGATIVE NEGATIVE mg/dL   Nitrite NEGATIVE NEGATIVE   Leukocytes, UA NEGATIVE NEGATIVE   A/POS/-- (11/10 0932)  Imaging:  No results found.  MAU Course/MDM: I have ordered labs as follows:  UA, HSV culture Imaging ordered: none, but photo taken since she is HIV to document progression of lesions Results reviewed.   Consult Dr Adrian Blackwater, since she is HIV.  He concurs with treatment with Septra DS.   Treatments in MAU included None.   Pt stable at time of discharge.  Assessment: 1. Marijuana use   2.    SIUP at [redacted]w[redacted]d  3.    Furuncles of perineum 4.   Probable round ligament pain since UA negative and cervix closed Plan: Discharge home Recommend [facility] Rx sent for Bactrim/Septra DS for 10 days for furuncles  Follow up in [facility] clinic for OB care and recheck of lesions     Medication List    ASK your doctor about these medications         acetaminophen 500 MG tablet  Commonly known as:  TYLENOL  Take 1,000 mg by mouth every 6 (six) hours as needed for mild pain.     aspirin 81 MG chewable tablet  Chew 1 tablet (81 mg total) by mouth daily.     darunavir-cobicistat 800-150 MG tablet  Commonly known as:  PREZCOBIX  Take 1 tablet by mouth daily. Swallow whole. Do NOT crush, break or chew tablets. Take with food.  emtricitabine-tenofovir 200-300 MG tablet  Commonly known as:  TRUVADA  Take 1 tablet by mouth daily.     labetalol 200 MG tablet  Commonly known as:  NORMODYNE  Take 1 tablet (200 mg total) by mouth 2 (two) times daily.     ondansetron 8 MG disintegrating tablet  Commonly known as:  ZOFRAN-ODT  Take 1 tablet (8 mg total) by mouth every 8 (eight) hours as needed for nausea or vomiting.     promethazine 25 MG tablet  Commonly known as:  PHENERGAN  Take 0.5-1 tablets (12.5-25 mg total) by mouth every 6 (six) hours as needed.     sulfacetamide-prednisoLONE 10-0.23 % ophthalmic solution  Commonly known as:  VASOCIDIN  Place 1 drop into the right eye every 3 (three) hours while awake.       Encouraged to return here or to other Urgent Care/ED if she develops worsening of symptoms, increase in pain, fever, or other concerning symptoms.   Wynelle Bourgeois CNM, MSN Certified Nurse-Midwife 02/11/2015 10:10 AM

## 2015-02-11 NOTE — Discharge Instructions (Signed)

## 2015-02-11 NOTE — MAU Note (Signed)
Pt C/O large bumps near her vagina since Friday, has become large, one had green discharge.  Also C/O stabbing pain in lower abd since last night.  Denies bleeding or discharge.

## 2015-02-12 LAB — GC/CHLAMYDIA PROBE AMP (~~LOC~~) NOT AT ARMC
CHLAMYDIA, DNA PROBE: NEGATIVE
NEISSERIA GONORRHEA: NEGATIVE

## 2015-02-14 LAB — HERPES SIMPLEX VIRUS CULTURE
Culture: NOT DETECTED
Special Requests: NORMAL

## 2015-02-15 ENCOUNTER — Other Ambulatory Visit (HOSPITAL_COMMUNITY): Payer: Self-pay | Admitting: Obstetrics and Gynecology

## 2015-02-15 ENCOUNTER — Ambulatory Visit (HOSPITAL_COMMUNITY)
Admission: RE | Admit: 2015-02-15 | Discharge: 2015-02-15 | Disposition: A | Discharge status: Home / Self Care | Payer: Medicaid Other | Source: Ambulatory Visit | Attending: Family Medicine | Admitting: Family Medicine

## 2015-02-15 ENCOUNTER — Encounter (HOSPITAL_COMMUNITY): Payer: Self-pay

## 2015-02-15 DIAGNOSIS — O09299 Supervision of pregnancy with other poor reproductive or obstetric history, unspecified trimester: Secondary | ICD-10-CM

## 2015-02-15 DIAGNOSIS — O10919 Unspecified pre-existing hypertension complicating pregnancy, unspecified trimester: Secondary | ICD-10-CM

## 2015-02-15 DIAGNOSIS — Z3689 Encounter for other specified antenatal screening: Secondary | ICD-10-CM

## 2015-02-15 DIAGNOSIS — Z3A18 18 weeks gestation of pregnancy: Secondary | ICD-10-CM | POA: Diagnosis not present

## 2015-02-15 DIAGNOSIS — O98712 Human immunodeficiency virus [HIV] disease complicating pregnancy, second trimester: Secondary | ICD-10-CM

## 2015-02-15 DIAGNOSIS — Z36 Encounter for antenatal screening of mother: Secondary | ICD-10-CM | POA: Diagnosis not present

## 2015-02-15 DIAGNOSIS — O10012 Pre-existing essential hypertension complicating pregnancy, second trimester: Secondary | ICD-10-CM | POA: Diagnosis not present

## 2015-02-15 DIAGNOSIS — O99212 Obesity complicating pregnancy, second trimester: Secondary | ICD-10-CM | POA: Insufficient documentation

## 2015-02-19 ENCOUNTER — Other Ambulatory Visit: Payer: Self-pay | Admitting: Internal Medicine

## 2015-02-25 ENCOUNTER — Encounter: Payer: Medicaid Other | Admitting: Obstetrics & Gynecology

## 2015-02-28 ENCOUNTER — Encounter: Payer: Self-pay | Admitting: Obstetrics & Gynecology

## 2015-02-28 ENCOUNTER — Ambulatory Visit (INDEPENDENT_AMBULATORY_CARE_PROVIDER_SITE_OTHER): Payer: Medicaid Other | Admitting: Obstetrics & Gynecology

## 2015-02-28 VITALS — BP 123/71 | HR 74 | Temp 97.9°F | Wt 223.7 lb

## 2015-02-28 DIAGNOSIS — O0992 Supervision of high risk pregnancy, unspecified, second trimester: Secondary | ICD-10-CM

## 2015-02-28 DIAGNOSIS — O98712 Human immunodeficiency virus [HIV] disease complicating pregnancy, second trimester: Secondary | ICD-10-CM

## 2015-02-28 DIAGNOSIS — O10912 Unspecified pre-existing hypertension complicating pregnancy, second trimester: Secondary | ICD-10-CM | POA: Diagnosis not present

## 2015-02-28 LAB — POCT URINALYSIS DIP (DEVICE)
BILIRUBIN URINE: NEGATIVE
GLUCOSE, UA: NEGATIVE mg/dL
Hgb urine dipstick: NEGATIVE
Ketones, ur: NEGATIVE mg/dL
LEUKOCYTES UA: NEGATIVE
NITRITE: NEGATIVE
Protein, ur: NEGATIVE mg/dL
SPECIFIC GRAVITY, URINE: 1.02 (ref 1.005–1.030)
UROBILINOGEN UA: 0.2 mg/dL (ref 0.0–1.0)
pH: 6.5 (ref 5.0–8.0)

## 2015-02-28 NOTE — Patient Instructions (Signed)
Return to clinic for any obstetric concerns or go to [facility] for evaluation  
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
## 2015-03-06 ENCOUNTER — Other Ambulatory Visit: Payer: Self-pay | Admitting: Internal Medicine

## 2015-03-14 ENCOUNTER — Other Ambulatory Visit (HOSPITAL_COMMUNITY): Payer: Self-pay | Admitting: Obstetrics and Gynecology

## 2015-03-14 ENCOUNTER — Other Ambulatory Visit: Payer: Self-pay | Admitting: Internal Medicine

## 2015-03-14 DIAGNOSIS — O10919 Unspecified pre-existing hypertension complicating pregnancy, unspecified trimester: Secondary | ICD-10-CM

## 2015-03-14 DIAGNOSIS — O09299 Supervision of pregnancy with other poor reproductive or obstetric history, unspecified trimester: Secondary | ICD-10-CM

## 2015-03-14 DIAGNOSIS — Z3A21 21 weeks gestation of pregnancy: Secondary | ICD-10-CM

## 2015-03-14 DIAGNOSIS — O99210 Obesity complicating pregnancy, unspecified trimester: Secondary | ICD-10-CM

## 2015-03-14 DIAGNOSIS — O98719 Human immunodeficiency virus [HIV] disease complicating pregnancy, unspecified trimester: Secondary | ICD-10-CM

## 2015-03-15 ENCOUNTER — Ambulatory Visit (HOSPITAL_COMMUNITY)
Admission: RE | Admit: 2015-03-15 | Discharge: 2015-03-15 | Disposition: A | Discharge status: Home / Self Care | Payer: Medicaid Other | Source: Ambulatory Visit | Attending: Family Medicine | Admitting: Family Medicine

## 2015-03-15 ENCOUNTER — Other Ambulatory Visit: Payer: Self-pay | Admitting: *Deleted

## 2015-03-15 DIAGNOSIS — O99210 Obesity complicating pregnancy, unspecified trimester: Secondary | ICD-10-CM

## 2015-03-15 DIAGNOSIS — Z3A22 22 weeks gestation of pregnancy: Secondary | ICD-10-CM | POA: Diagnosis not present

## 2015-03-15 DIAGNOSIS — O99212 Obesity complicating pregnancy, second trimester: Secondary | ICD-10-CM | POA: Insufficient documentation

## 2015-03-15 DIAGNOSIS — O09292 Supervision of pregnancy with other poor reproductive or obstetric history, second trimester: Secondary | ICD-10-CM | POA: Diagnosis not present

## 2015-03-15 DIAGNOSIS — O09299 Supervision of pregnancy with other poor reproductive or obstetric history, unspecified trimester: Secondary | ICD-10-CM

## 2015-03-15 DIAGNOSIS — O10012 Pre-existing essential hypertension complicating pregnancy, second trimester: Secondary | ICD-10-CM | POA: Insufficient documentation

## 2015-03-15 DIAGNOSIS — O98719 Human immunodeficiency virus [HIV] disease complicating pregnancy, unspecified trimester: Secondary | ICD-10-CM

## 2015-03-15 DIAGNOSIS — Z3A21 21 weeks gestation of pregnancy: Secondary | ICD-10-CM

## 2015-03-15 DIAGNOSIS — O10919 Unspecified pre-existing hypertension complicating pregnancy, unspecified trimester: Secondary | ICD-10-CM

## 2015-03-15 DIAGNOSIS — O98712 Human immunodeficiency virus [HIV] disease complicating pregnancy, second trimester: Secondary | ICD-10-CM | POA: Diagnosis not present

## 2015-03-15 MED ORDER — ONDANSETRON 8 MG PO TBDP: ORAL_TABLET | ORAL | Status: DC

## 2015-03-28 ENCOUNTER — Encounter: Payer: Medicaid Other | Admitting: Obstetrics and Gynecology

## 2015-04-01 ENCOUNTER — Encounter: Payer: Medicaid Other | Admitting: Family Medicine

## 2015-04-12 ENCOUNTER — Ambulatory Visit (HOSPITAL_COMMUNITY)
Admission: RE | Admit: 2015-04-12 | Discharge: 2015-04-12 | Disposition: A | Discharge status: Home / Self Care | Payer: Medicaid Other | Source: Ambulatory Visit | Attending: Family Medicine | Admitting: Family Medicine

## 2015-04-12 ENCOUNTER — Other Ambulatory Visit: Payer: Self-pay | Admitting: Internal Medicine

## 2015-04-12 ENCOUNTER — Encounter (HOSPITAL_COMMUNITY): Payer: Self-pay

## 2015-04-12 ENCOUNTER — Other Ambulatory Visit (HOSPITAL_COMMUNITY): Payer: Self-pay | Admitting: Obstetrics and Gynecology

## 2015-04-12 DIAGNOSIS — O10019 Pre-existing essential hypertension complicating pregnancy, unspecified trimester: Secondary | ICD-10-CM

## 2015-04-12 DIAGNOSIS — O98712 Human immunodeficiency virus [HIV] disease complicating pregnancy, second trimester: Secondary | ICD-10-CM | POA: Insufficient documentation

## 2015-04-12 DIAGNOSIS — O10919 Unspecified pre-existing hypertension complicating pregnancy, unspecified trimester: Secondary | ICD-10-CM

## 2015-04-12 DIAGNOSIS — O09292 Supervision of pregnancy with other poor reproductive or obstetric history, second trimester: Secondary | ICD-10-CM | POA: Diagnosis not present

## 2015-04-12 DIAGNOSIS — O99212 Obesity complicating pregnancy, second trimester: Secondary | ICD-10-CM

## 2015-04-12 DIAGNOSIS — Z3A26 26 weeks gestation of pregnancy: Secondary | ICD-10-CM | POA: Insufficient documentation

## 2015-04-12 DIAGNOSIS — O10012 Pre-existing essential hypertension complicating pregnancy, second trimester: Secondary | ICD-10-CM | POA: Diagnosis present

## 2015-04-12 DIAGNOSIS — O09892 Supervision of other high risk pregnancies, second trimester: Secondary | ICD-10-CM

## 2015-04-12 DIAGNOSIS — R112 Nausea with vomiting, unspecified: Secondary | ICD-10-CM

## 2015-04-12 IMAGING — US US MFM OB FOLLOW-UP
1 series · 14 of 28 positions shown · non-contrast
Comparison: none

[Series 1: us mfm ob follow-up · 14 of 32 slices shown]
[im 2/32]
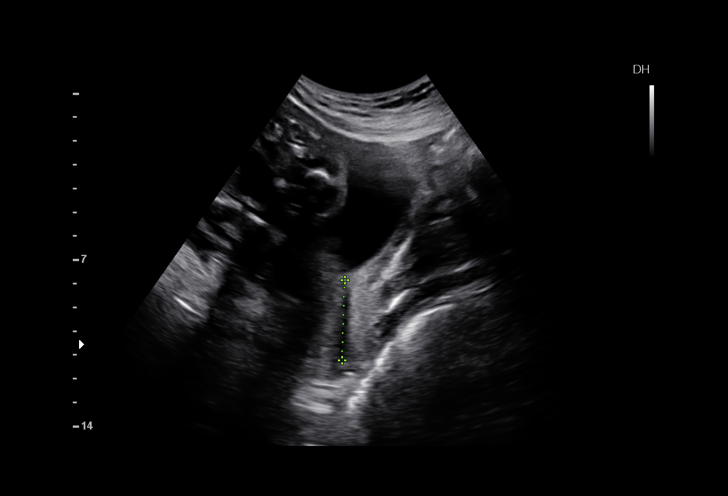
[im 4/32]
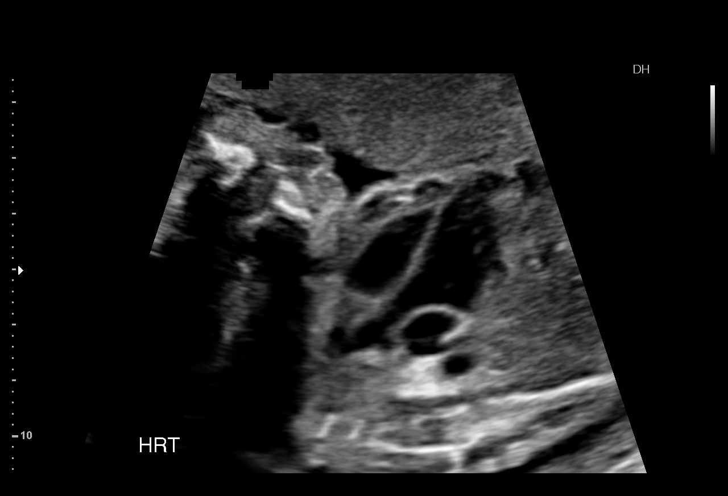
[im 6/32]
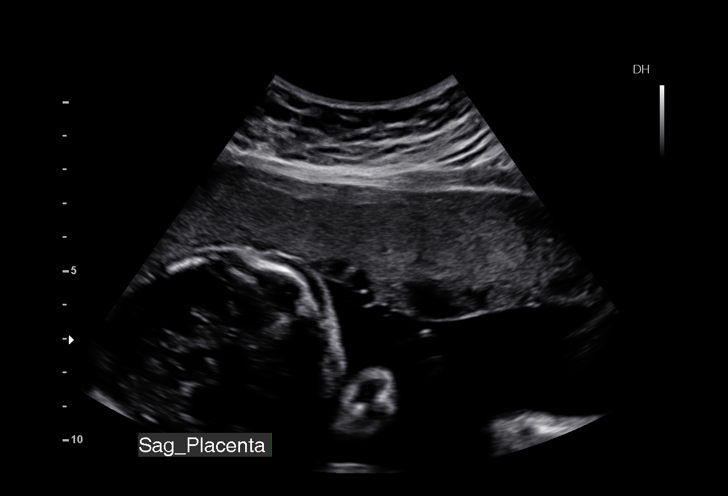
[im 9/32]
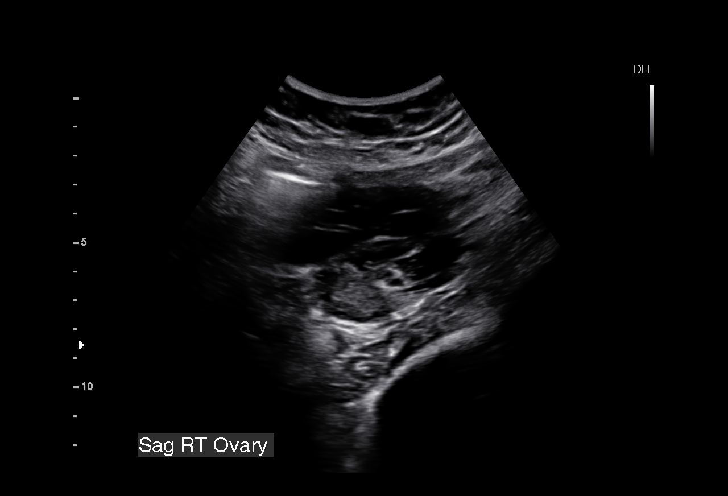
[im 11/32]
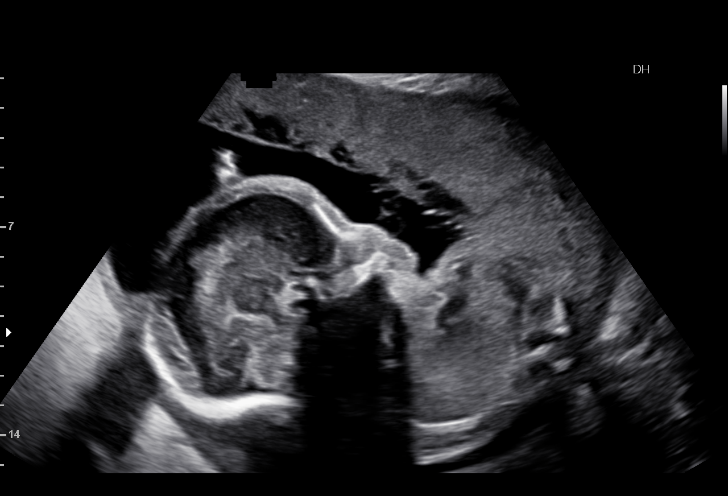
[im 13/32]
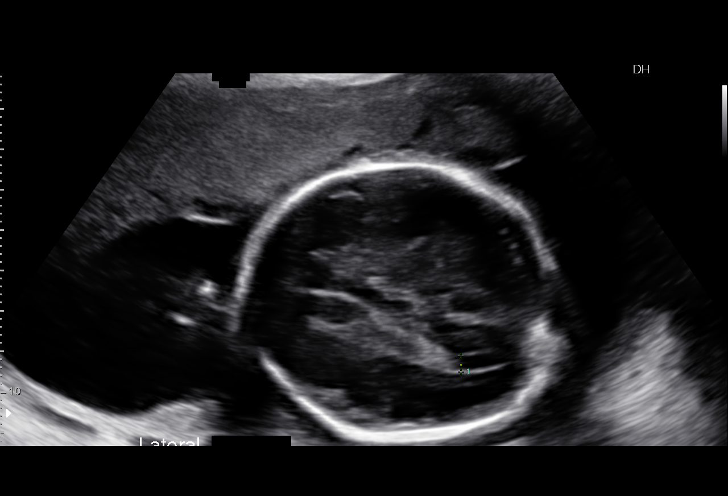
[im 15/32]
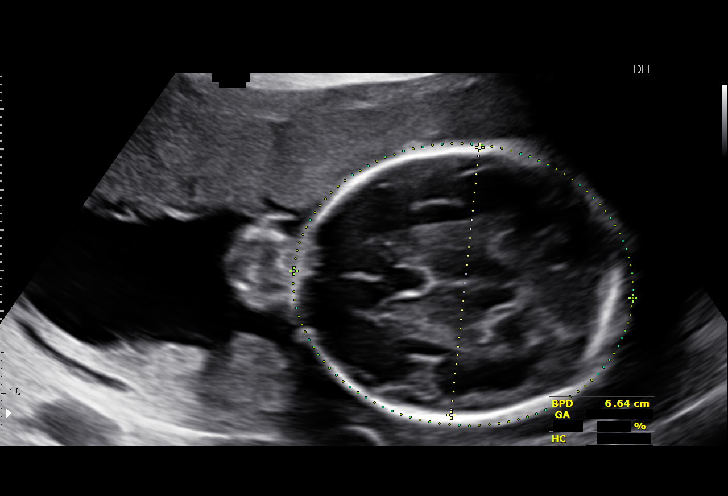
[im 18/32]
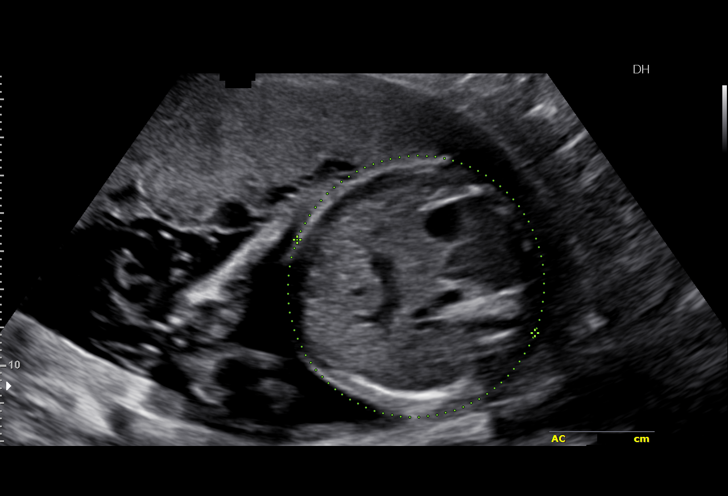
[im 20/32]
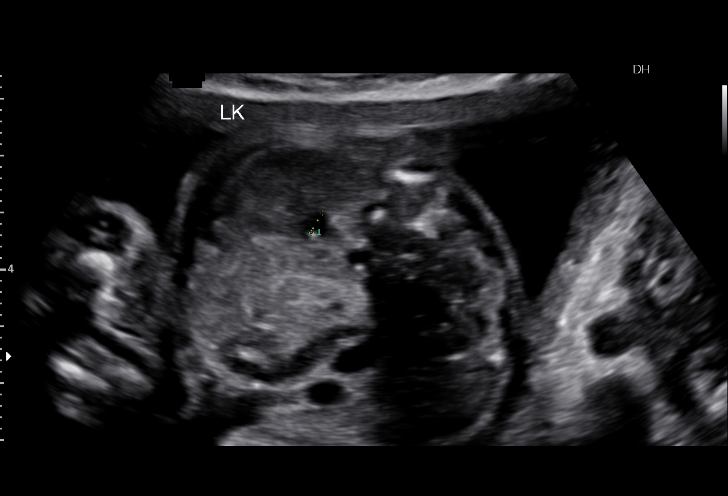
[im 22/32]
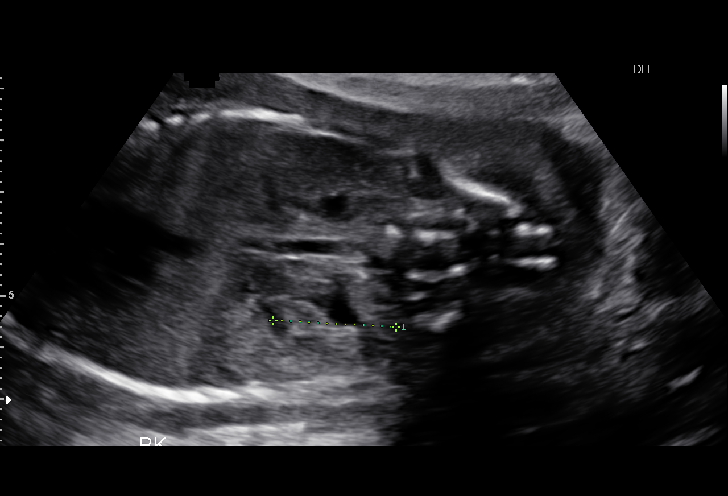
[im 25/32]
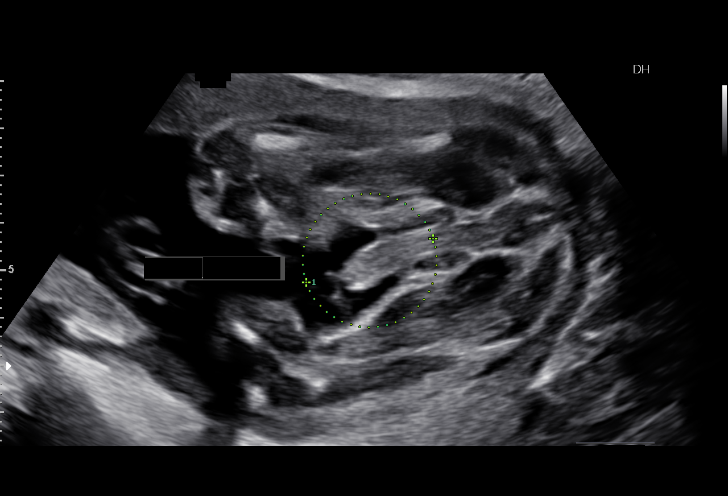
[im 27/32]
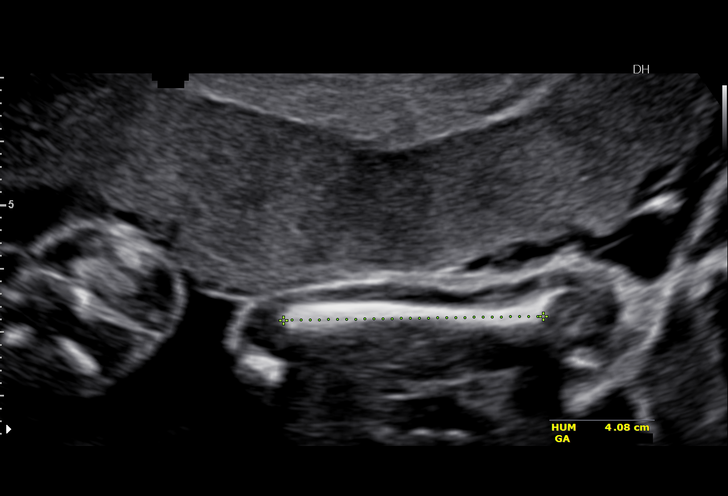
[im 29/32]
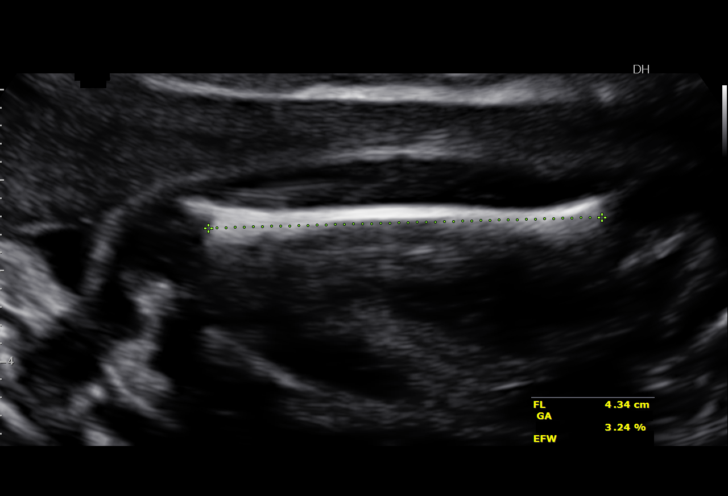
[im 32/32]
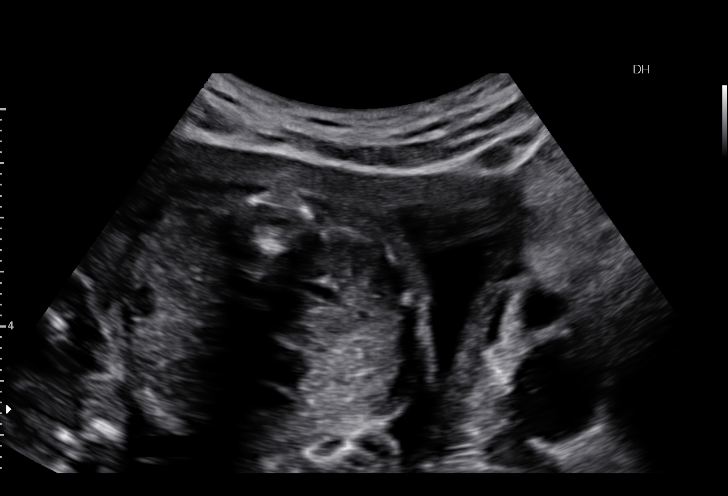

[14 of 28 positions shown; findings below may reference images not displayed]

Hospital Clinic-
Faculty Physician
OB/Gyn Clinic

Indications

26 weeks gestation of pregnancy
HIV affecting pregnancy, second trimester      [WG]
Hypertension - Chronic/Pre-existing (on        [WG]
labetalol)
Poor obstetric history: Previous               [WG]
preeclampsia / eclampsia/gestational HTN
Obesity complicating pregnancy, second         [WG]
trimester
OB History

Height:       5'2"    Weight:   224       BMI:
Gravidity:    4         Term:   3        Prem:   0        SAB:   0
TOP:          0       Ectopic:  0        Living: 3
Fetal Evaluation

Num Of Fetuses:     1
Fetal Heart         149
Rate(bpm):
Cardiac Activity:   Observed
Presentation:       Breech
Placenta:           Anterior, above cervical os
P. Cord Insertion:  Previously Visualized
Amniotic Fluid
AFI FV:      Subjectively within normal limits
Larg Pckt:     6.5  cm
Biometry

BPD:      66.3  mm     G. Age:  26w 5d                  CI:        76.81   %   70 - 86
FL/HC:      18.1   %   18.6 -
HC:      239.6  mm     G. Age:  26w 0d         26  %    HC/AC:      1.12       1.04 -
AC:       214   mm     G. Age:  25w 6d         39  %    FL/BPD:     65.5   %   71 - 87
FL:       43.4  mm     G. Age:  24w 2d          3  %    FL/AC:      20.3   %   20 - 24
HUM:      41.2  mm     G. Age:  25w 0d         19  %
CER:      28.4  mm     G. Age:  25w 3d         41  %
CM:        5.2  mm

Est. FW:     801  gm    1 lb 12 oz      40  %
Gestational Age

LMP:           26w 0d       Date:   [DATE]                 EDD:   [DATE]
U/S Today:     25w 5d                                        EDD:   [DATE]
Best:          26w 0d    Det. By:   LMP  ([DATE])          EDD:   [DATE]
Anatomy

Cranium:          Appears normal         Aortic Arch:      Previously seen
Fetal Cavum:      Appears normal         Ductal Arch:      Previously seen
Ventricles:       Appears normal         Diaphragm:        Previously seen
Choroid Plexus:   Previously seen        Stomach:          Appears normal, left
sided
Cerebellum:       Appears normal         Abdomen:          Appears normal
Posterior Fossa:  Appears normal         Abdominal Wall:   Previously seen
Nuchal Fold:      Previously seen        Cord Vessels:     Previously seen
Face:             Orbits and profile     Kidneys:          Appear normal
previously seen
Lips:             Previously seen        Bladder:          Appears normal
Fetal Thoracic:   Appears normal         Spine:            Previously seen
Heart:            Appears normal         Upper             Previously seen
(4CH, axis, and        Extremities:
situs)
RVOT:             Previously seen        Lower             Previously seen
Extremities:
LVOT:             Previously seen

Other:  Fetus appears to be a male. Heels and 5th digit previously visualized.
Nasal bone previously visualized.
Cervix Uterus Adnexa

Cervix
Length:            3.3  cm.
Normal appearance by transabdominal scan.

Left Ovary
Within normal limits.

Right Ovary
Within normal limits.

Adnexa:       No abnormality visualized.
Impression

SIUP at 26+0 weeks
Normal interval anatomy; anatomic survey complete
Normal amniotic fluid volume
Appropriate interval growth with EFW at the 40th %tile
Recommendations

Follow-up ultrasound for growth in 4 weeks

## 2015-04-14 ENCOUNTER — Inpatient Hospital Stay (HOSPITAL_COMMUNITY)
Admission: AD | Admit: 2015-04-14 | Discharge: 2015-04-14 | Disposition: A | Discharge status: Home / Self Care | Payer: Medicaid Other | Source: Ambulatory Visit | Attending: Obstetrics & Gynecology | Admitting: Obstetrics & Gynecology

## 2015-04-14 ENCOUNTER — Encounter (HOSPITAL_COMMUNITY): Payer: Self-pay | Admitting: *Deleted

## 2015-04-14 DIAGNOSIS — Z87891 Personal history of nicotine dependence: Secondary | ICD-10-CM | POA: Insufficient documentation

## 2015-04-14 DIAGNOSIS — R05 Cough: Secondary | ICD-10-CM | POA: Diagnosis present

## 2015-04-14 DIAGNOSIS — O99012 Anemia complicating pregnancy, second trimester: Secondary | ICD-10-CM | POA: Diagnosis not present

## 2015-04-14 DIAGNOSIS — Z3A26 26 weeks gestation of pregnancy: Secondary | ICD-10-CM | POA: Insufficient documentation

## 2015-04-14 DIAGNOSIS — J069 Acute upper respiratory infection, unspecified: Secondary | ICD-10-CM | POA: Insufficient documentation

## 2015-04-14 DIAGNOSIS — D573 Sickle-cell trait: Secondary | ICD-10-CM | POA: Diagnosis not present

## 2015-04-14 DIAGNOSIS — Z21 Asymptomatic human immunodeficiency virus [HIV] infection status: Secondary | ICD-10-CM | POA: Insufficient documentation

## 2015-04-14 DIAGNOSIS — O98712 Human immunodeficiency virus [HIV] disease complicating pregnancy, second trimester: Secondary | ICD-10-CM | POA: Insufficient documentation

## 2015-04-14 DIAGNOSIS — Z7982 Long term (current) use of aspirin: Secondary | ICD-10-CM | POA: Insufficient documentation

## 2015-04-14 DIAGNOSIS — O99512 Diseases of the respiratory system complicating pregnancy, second trimester: Secondary | ICD-10-CM | POA: Insufficient documentation

## 2015-04-14 LAB — URINALYSIS, ROUTINE W REFLEX MICROSCOPIC
Bilirubin Urine: NEGATIVE
Glucose, UA: NEGATIVE mg/dL
Hgb urine dipstick: NEGATIVE
Ketones, ur: NEGATIVE mg/dL
LEUKOCYTES UA: NEGATIVE
Nitrite: NEGATIVE
PROTEIN: NEGATIVE mg/dL
Specific Gravity, Urine: 1.015 (ref 1.005–1.030)
pH: 5.5 (ref 5.0–8.0)

## 2015-04-14 MED ORDER — ONDANSETRON 8 MG PO TBDP
8.0000 mg | ORAL_TABLET | Freq: Once | ORAL | Status: AC
  Administered 2015-04-14: 8 mg via ORAL
  Filled 2015-04-14: qty 1

## 2015-04-14 MED ORDER — ONDANSETRON 8 MG PO TBDP: 8.0000 mg | ORAL_TABLET | Freq: Three times a day (TID) | ORAL | Status: DC | PRN

## 2015-04-14 NOTE — Progress Notes (Signed)
Pt notified that BP high.  Pt states that she controls her high blood pressure with her MD.

## 2015-04-14 NOTE — Discharge Instructions (Signed)

## 2015-04-14 NOTE — MAU Provider Note (Signed)
History   CSN: [id number]  Arrival date and time: 04/14/15 1303   None     Chief Complaint  Patient presents with  . Headache  . Generalized Body Aches  . Emesis   HPI Patient is 30 y.o. [id number] 1995w2d here with multiple complaints including cough, congestion, headache, body ache, vomiting. States symptoms started this Friday. She has tried OTC cold/flu medications with no relief. She did receive the flu vaccine this season. Only sick contact is son who just recovered from bronchiolitis. Patient denies any fevers. Requesting zofran as this helps with her nausea/vomiting.   +FM, denies LOF, VB, contractions, vaginal discharge.   OB History    Gravida Para Term Preterm AB TAB SAB Ectopic Multiple Living   4 3 3  0     0 3    -[facility] -chronic hypertension -HIV+  Past Medical History  Diagnosis Date  . Cholecystitis 07/16/10  . Hypertension   . HIV (human immunodeficiency virus infection) (HCC)   . Genital warts 2004  . Sickle cell trait [facility](HCC)     Past Surgical History  Procedure Laterality Date  . Cholecystectomy    . Wisdom tooth extraction      Family History  Problem Relation Age of Onset  . Cancer Mother   . Diabetes Maternal Aunt     Social History  Substance Use Topics  . Smoking status: Former Smoker    Start date: 03/11/2012    Quit date: 08/24/2012  . Smokeless tobacco: Never Used  . Alcohol Use: No    Allergies:  Allergies  Allergen Reactions  . Stadol [Butorphanol Tartrate] Itching    Prescriptions prior to admission  Medication Sig Dispense Refill Last Dose  . acetaminophen (TYLENOL) 500 MG tablet Take 1,000 mg by mouth every 6 (six) hours as needed for mild pain.   Taking  . aspirin 81 MG chewable tablet Chew 1 tablet (81 mg total) by mouth daily. (Patient not taking: Reported on 02/28/2015) 90 tablet 3 Not Taking  . darunavir-cobicistat (PREZCOBIX) 800-150 MG tablet Take 1 tablet by mouth daily. Swallow whole. Do NOT crush, break or chew tablets.  Take with food. 30 tablet 5 Taking  . emtricitabine-tenofovir (TRUVADA) 200-300 MG tablet Take 1 tablet by mouth daily. 30 tablet 5 Taking  . labetalol (NORMODYNE) 200 MG tablet Take 1 tablet (200 mg total) by mouth 2 (two) times daily. 60 tablet 3 Taking  . ondansetron (ZOFRAN-ODT) 8 MG disintegrating tablet DISSOLVE 1 TABLET BY MOUTH EVERY 8 HOURS AS NEEDED FOR NAUSEA AND VOMITING 30 tablet 2 Taking  . sulfamethoxazole-trimethoprim (BACTRIM DS,SEPTRA DS) 800-160 MG tablet Take 1 tablet by mouth 2 (two) times daily. (Patient not taking: Reported on 04/12/2015) 20 tablet 1 Not Taking    Review of Systems  Constitutional: Negative for fever and chills.  HENT: Positive for congestion.   Eyes: Negative for blurred vision and double vision.  Respiratory: Positive for cough. Negative for shortness of breath.   Cardiovascular: Positive for chest pain. Negative for leg swelling.  Gastrointestinal: Positive for nausea, vomiting and abdominal pain. Negative for constipation.  Genitourinary: Negative for dysuria.  Neurological: Positive for dizziness and headaches.  Also per HPI  Physical Exam   Blood pressure 133/83, pulse 73, temperature 98 F (36.7 C), temperature source Oral, resp. rate 18, height 5\' 2"  (1.575 m), weight 225 lb 6.4 oz (102.241 kg), last menstrual period 10/12/2014, not currently breastfeeding.  Physical Exam  Constitutional: She appears well-developed and well-nourished. No distress.  HENT:  Head: Normocephalic and atraumatic.  Mouth/Throat: Oropharynx is clear and moist.  Eyes: Conjunctivae and EOM are normal.  Neck: Normal range of motion.  Cardiovascular: Normal rate, regular rhythm, normal heart sounds and intact distal pulses.   Respiratory: Effort normal and breath sounds normal.  GI: Soft. Bowel sounds are normal. There is no tenderness.  gravid  Musculoskeletal: Normal range of motion. She exhibits no edema or tenderness.  Lymphadenopathy:    She has no cervical  adenopathy.  Neurological: She is alert.  Skin: Skin is warm and dry.    MAU Course  Procedures - None  MDM  Assessment and Plan  A: Patient is 30 y.o. [id number] [redacted]w[redacted]d reporting URI symptoms. Vitals are stable and patient is well-appearing and afebrile. With sick contact and symptoms viral infection likely.   P: Discharge home - Reviewed findings and my conclusion - Rx for zofran given  - Handout given - Follow-up with OB provider  Caryl Ada, DO 04/14/2015, 1:36 PM  PGY-2, [facility]

## 2015-04-14 NOTE — MAU Note (Addendum)
Onset of headache, body aches and vomiting since Friday, some chills, denies fever, also cough hurting in chest area.

## 2015-04-19 ENCOUNTER — Other Ambulatory Visit: Payer: Self-pay | Admitting: *Deleted

## 2015-04-19 DIAGNOSIS — R112 Nausea with vomiting, unspecified: Secondary | ICD-10-CM

## 2015-04-19 MED ORDER — ONDANSETRON 8 MG PO TBDP: 8.0000 mg | ORAL_TABLET | Freq: Three times a day (TID) | ORAL | Status: DC | PRN

## 2015-04-21 ENCOUNTER — Encounter (HOSPITAL_COMMUNITY): Payer: Self-pay | Admitting: *Deleted

## 2015-04-21 ENCOUNTER — Emergency Department (HOSPITAL_COMMUNITY)
Admission: EM | Admit: 2015-04-21 | Discharge: 2015-04-21 | Disposition: A | Discharge status: Home / Self Care | Payer: Medicaid Other | Attending: Emergency Medicine | Admitting: Emergency Medicine

## 2015-04-21 DIAGNOSIS — O99712 Diseases of the skin and subcutaneous tissue complicating pregnancy, second trimester: Secondary | ICD-10-CM | POA: Insufficient documentation

## 2015-04-21 DIAGNOSIS — Z21 Asymptomatic human immunodeficiency virus [HIV] infection status: Secondary | ICD-10-CM | POA: Insufficient documentation

## 2015-04-21 DIAGNOSIS — Z8719 Personal history of other diseases of the digestive system: Secondary | ICD-10-CM | POA: Insufficient documentation

## 2015-04-21 DIAGNOSIS — Z79899 Other long term (current) drug therapy: Secondary | ICD-10-CM | POA: Diagnosis not present

## 2015-04-21 DIAGNOSIS — Z862 Personal history of diseases of the blood and blood-forming organs and certain disorders involving the immune mechanism: Secondary | ICD-10-CM | POA: Diagnosis not present

## 2015-04-21 DIAGNOSIS — O10012 Pre-existing essential hypertension complicating pregnancy, second trimester: Secondary | ICD-10-CM | POA: Diagnosis not present

## 2015-04-21 DIAGNOSIS — Z8619 Personal history of other infectious and parasitic diseases: Secondary | ICD-10-CM | POA: Diagnosis not present

## 2015-04-21 DIAGNOSIS — Z3A27 27 weeks gestation of pregnancy: Secondary | ICD-10-CM | POA: Insufficient documentation

## 2015-04-21 DIAGNOSIS — L0201 Cutaneous abscess of face: Secondary | ICD-10-CM

## 2015-04-21 DIAGNOSIS — Z87891 Personal history of nicotine dependence: Secondary | ICD-10-CM | POA: Diagnosis not present

## 2015-04-21 MED ORDER — LIDOCAINE-EPINEPHRINE (PF) 2 %-1:200000 IJ SOLN
10.0000 mL | Freq: Once | INTRAMUSCULAR | Status: AC
  Administered 2015-04-21: 10 mL via INTRADERMAL
  Filled 2015-04-21: qty 20

## 2015-04-21 MED ORDER — CEPHALEXIN 500 MG PO CAPS: 500.0000 mg | ORAL_CAPSULE | Freq: Four times a day (QID) | ORAL | Status: DC

## 2015-04-21 NOTE — ED Notes (Signed)
Pt presents today with an abscess to Lt side of fore head and nose. Pt also reports to be [redacted] weeks gestation.

## 2015-04-21 NOTE — ED Notes (Signed)
Declined W/C at D/C and was escorted to lobby by RN. 

## 2015-04-21 NOTE — Discharge Instructions (Signed)
Warm compresses. You can apply topical antibiotic such as neosporin. Take keflex as prescribed until all gone. Follow up with yur primary care doctor.    Percutaneous Abscess Drain, Care After Refer to this sheet in the next few weeks. These instructions provide you with information on caring for yourself after your procedure. Your health care provider may also give you more specific instructions. Your treatment has been planned according to current medical practices, but problems sometimes occur. Call your health care provider if you have any problems or questions after your procedure. WHAT TO EXPECT AFTER THE PROCEDURE After your procedure, it is typical to have the following:   A small amount of discomfort in the area where the drainage tube was placed.  A small amount of bruising around the area where the drainage tube was placed.  Sleepiness and fatigue for the rest of the day from the medicines used. HOME CARE INSTRUCTIONS  Rest at home for 1-2 days following your procedure or as directed by your health care provider.  If you go home right after the procedure, plan to have someone with you for 24 hours.  Do not take a bathor shower for 24 hours after your procedure.  Take medicines only as directed by your health care provider. Ask your health care provider when you can resume taking any normal medicines.  Change bandages (dressings) as directed.   You may be told to record the amount of drainage from the bag every time you empty it. Follow your health care provider's directions for emptying the bag. Write down the amount of drainage, the date, and the time you emptied it.  Call your health care provider when the drain is putting out less than 10 mL of drainage per day for 2-3 days in a row or as directed by your health care provider.  Follow your health care provider's instructions for cleaning the drainage tube. You may need to clean the tube every day so that it does not  clog. SEEK MEDICAL CARE IF:  You have increased bleeding (more than a small spot) from the site where the drainage tube was placed.  You have redness, swelling, or increasing pain around the site where the drainage tube was placed.  You notice a discharge or bad smell coming from the site where the drainage tube was placed.  You have a fever or chills.  You have pain that is not helped by medicine.  SEEK IMMEDIATE MEDICAL CARE IF:  There is leakage around the drainage tube.  The drainage tube pulls out.  You suddenly stop having drainage from the tube.  You suddenly have blood in the drainage fluid.  You become dizzy or faint.  You develop a rash.   You have nausea or vomiting.  You have difficulty breathing, feel short of breath, or feel faint.   You develop chest pain.  You have problems with your speech or vision.  You have trouble balancing or moving your arms or legs.   This information is not intended to replace advice given to you by your health care provider. Make sure you discuss any questions you have with your health care provider.   Document Released: 06/12/2013 Document Revised: 11/14/2013 Document Reviewed: 06/12/2013 Elsevier Interactive Patient Education [employer]2016 [employer] Inc.

## 2015-04-21 NOTE — ED Provider Notes (Signed)
CSN: [id number]     Arrival date & time 04/21/15  1107 History  By signing my name below, I, Catherine Simmons, attest that this documentation has been prepared under the direction and in the presence of [name], [employer] Electronically Signed: Soijett Simmons, ED Scribe. 04/21/2015. 11:47 AM.   Chief Complaint  Patient presents with  . Abscess      The history is provided by the patient. No language interpreter was used.    Catherine Simmons is a 30 y.o. female with a medical hx of HIV, HTN, who presents to the Emergency Department complaining of multiple abscesses to left forehead onset 1 week. She notes that the area is painful and tingling. Pt notes that her scarf got stuck on the abscesses and removed some skin, but she denies any drainage from the area.Pt denies having these symptoms in that past. She notes that she has not tried medications but she used neosporin and warm compresses for the relief of her symptoms. She denies fever, chills, drainage, and any other symptoms. Denies allergies to medications. Pt notes that she is [redacted] weeks pregnant at this time.   Past Medical History  Diagnosis Date  . Cholecystitis 07/16/10  . Hypertension   . HIV (human immunodeficiency virus infection) (HCC)   . Genital warts 2004  . Sickle cell trait [facility])    Past Surgical History  Procedure Laterality Date  . Cholecystectomy    . Wisdom tooth extraction     Family History  Problem Relation Age of Onset  . Cancer Mother   . Diabetes Maternal Aunt    Social History  Substance Use Topics  . Smoking status: Former Smoker    Start date: 03/11/2012    Quit date: 08/24/2012  . Smokeless tobacco: Never Used  . Alcohol Use: No   OB History    Gravida Para Term Preterm AB TAB SAB Ectopic Multiple Living   0     0 3     Review of Systems  Constitutional: Negative for fever and chills.  Skin: Negative for color change and rash.       Multiple abscesses to left forehead       Allergies  Stadol  Home Medications   Prior to Admission medications   Medication Sig Start Date End Date Taking? Authorizing Provider  acetaminophen (TYLENOL) 500 MG tablet Take 1,000 mg by mouth every 6 (six) hours as needed for mild pain.    Historical Provider, MD  aspirin 81 MG chewable tablet Chew 1 tablet (81 mg total) by mouth daily. Patient not taking: Reported on 02/28/2015 01/28/15   Reva Bores, MD  darunavir-cobicistat (PREZCOBIX) 800-150 MG tablet Take 1 tablet by mouth daily. Swallow whole. Do NOT crush, break or chew tablets. Take with food. 01/08/15   Judyann Munson, MD  emtricitabine-tenofovir (TRUVADA) 200-300 MG tablet Take 1 tablet by mouth daily. 01/08/15   Judyann Munson, MD  labetalol (NORMODYNE) 200 MG tablet Take 1 tablet (200 mg total) by mouth 2 (two) times daily. 12/20/14   Levie Heritage, DO  ondansetron (ZOFRAN-ODT) 8 MG disintegrating tablet Take 1 tablet (8 mg total) by mouth every 8 (eight) hours as needed for nausea or vomiting. 04/19/15   Judyann Munson, MD  sulfamethoxazole-trimethoprim (BACTRIM DS,SEPTRA DS) 800-160 MG tablet Take 1 tablet by mouth 2 (two) times daily. Patient not taking: Reported on 04/12/2015 02/11/15   Aviva Signs, CNM   LMP 10/12/2014 Physical Exam  Constitutional: She is  oriented to person, place, and time. She appears well-developed and well-nourished. No distress.  HENT:  Head: Normocephalic and atraumatic.  Eyes: EOM are normal.  Neck: Neck supple.  Cardiovascular: Normal rate.   Pulmonary/Chest: Effort normal. No respiratory distress.  Musculoskeletal: Normal range of motion.  Neurological: She is alert and oriented to person, place, and time.  Skin: Skin is warm and dry.  Two pustules with scabbing and purulent drainage to the left upper forehead. Indurated. TTP. Fluctuant.   Psychiatric: She has a normal mood and affect. Her behavior is normal.  Nursing note and vitals reviewed.   ED Course  Procedures  (including critical care time) DIAGNOSTIC STUDIES: Oxygen Saturation is 100% on RA, nl by my interpretation.    COORDINATION OF CARE: 11:45 AM Discussed treatment plan with pt at bedside which includes I&D and pt agreed to plan.  INCISION AND DRAINAGE PROCEDURE NOTE: Patient identification was confirmed and verbal consent was obtained. This procedure was performed by Jaynie Crumbleatyana Shilpa Bushee, PA-C at 12:06 PM. Site: Left forehead Sterile procedures observed: Yes Needle size: 25 gauge Anesthetic used (type and amt): 2% lidocaine with epinephrine and 3 cc used Blade size: 11 Drainage: moderate, purulent Complexity: Complex Packing used: No Site anesthetized, incision made over site, wound drained and explored loculations, rinsed with copious amounts of normal saline, covered with dry, sterile dressing.  Pt tolerated procedure well without complications.  Instructions for care discussed verbally and pt provided with additional written instructions for homecare and f/u.   Labs Review Labs Reviewed - No data to display  Imaging Review No results found.    EKG Interpretation None      MDM   Final diagnoses:  Abscess of face   Patient with 2 superficial abscess-like structures with crusting and drainage to the left forehead. Patient is HIV positive, considered other HIV related lesions, however because there was purulent drainage, decided to incise and drain the wound. Both abscesses with large purulent drainage. Will start on antibiotic and have patient follow-up closely for recheck. Return precautions discussed. Patient is afebrile, otherwise nontoxic appearing. No evidence of cellulitis or serious infection.  Filed Vitals:   04/21/15 1114 04/21/15 1237  BP: 135/75 147/81  Pulse: 77 82  Temp: 97.6 F (36.4 C)   TempSrc: Oral   Resp: 16 16  SpO2: 100% 100%     Jaynie Crumbleatyana Kyaire Gruenewald, PA-C 04/21/15 1650  Lorre NickAnthony Allen, MD 04/24/15 1422

## 2015-04-23 ENCOUNTER — Ambulatory Visit: Payer: Medicaid Other | Admitting: Internal Medicine

## 2015-04-23 ENCOUNTER — Telehealth: Payer: Self-pay | Admitting: *Deleted

## 2015-04-23 NOTE — Telephone Encounter (Signed)
Called the patient to try and reschedule her missed appt. Had to leave a message for her to call the clinic asap.

## 2015-04-24 ENCOUNTER — Other Ambulatory Visit: Payer: Self-pay | Admitting: Internal Medicine

## 2015-04-24 ENCOUNTER — Other Ambulatory Visit: Payer: Medicaid Other

## 2015-04-24 DIAGNOSIS — B20 Human immunodeficiency virus [HIV] disease: Secondary | ICD-10-CM

## 2015-04-24 LAB — CBC WITH DIFFERENTIAL/PLATELET
Basophils Absolute: 0 10*3/uL (ref 0.0–0.1)
Basophils Relative: 0 % (ref 0–1)
EOS PCT: 1 % (ref 0–5)
Eosinophils Absolute: 0.1 10*3/uL (ref 0.0–0.7)
HEMATOCRIT: 30.3 % — AB (ref 36.0–46.0)
HEMOGLOBIN: 10.1 g/dL — AB (ref 12.0–15.0)
LYMPHS ABS: 2.1 10*3/uL (ref 0.7–4.0)
LYMPHS PCT: 33 % (ref 12–46)
MCH: 26.7 pg (ref 26.0–34.0)
MCHC: 33.3 g/dL (ref 30.0–36.0)
MCV: 80.2 fL (ref 78.0–100.0)
MONO ABS: 0.5 10*3/uL (ref 0.1–1.0)
MONOS PCT: 8 % (ref 3–12)
MPV: 9.4 fL (ref 8.6–12.4)
NEUTROS ABS: 3.7 10*3/uL (ref 1.7–7.7)
Neutrophils Relative %: 58 % (ref 43–77)
Platelets: 246 10*3/uL (ref 150–400)
RBC: 3.78 MIL/uL — ABNORMAL LOW (ref 3.87–5.11)
RDW: 14.4 % (ref 11.5–15.5)
WBC: 6.3 10*3/uL (ref 4.0–10.5)

## 2015-04-24 LAB — COMPLETE METABOLIC PANEL WITH GFR
ALT: 4 U/L — AB (ref 6–29)
AST: 7 U/L — AB (ref 10–30)
Albumin: 3 g/dL — ABNORMAL LOW (ref 3.6–5.1)
Alkaline Phosphatase: 73 U/L (ref 33–115)
BUN: 5 mg/dL — AB (ref 7–25)
CALCIUM: 8.4 mg/dL — AB (ref 8.6–10.2)
CHLORIDE: 104 mmol/L (ref 98–110)
CO2: 22 mmol/L (ref 20–31)
CREATININE: 0.49 mg/dL — AB (ref 0.50–1.10)
GFR, Est African American: >89 mL/min (ref >=60)
GFR, Est Non African American: >89 mL/min (ref >=60)
GLUCOSE: 78 mg/dL (ref 65–99)
POTASSIUM: 4.1 mmol/L (ref 3.5–5.3)
SODIUM: 135 mmol/L (ref 135–146)
Total Bilirubin: 0.4 mg/dL (ref 0.2–1.2)
Total Protein: 6.3 g/dL (ref 6.1–8.1)

## 2015-04-25 LAB — T-HELPER CELL (CD4) - (RCID CLINIC ONLY)
CD4 % Helper T Cell: 39 % (ref 33–55)
CD4 T CELL ABS: 710 /uL (ref 400–2700)

## 2015-04-25 LAB — HIV-1 RNA ULTRAQUANT REFLEX TO GENTYP+
HIV 1 RNA Quant: <20 copies/mL (ref <20)
HIV-1 RNA Quant, Log: <1.3 Log copies/mL (ref <1.30)

## 2015-04-29 ENCOUNTER — Telehealth: Payer: Self-pay

## 2015-04-29 ENCOUNTER — Ambulatory Visit (INDEPENDENT_AMBULATORY_CARE_PROVIDER_SITE_OTHER): Payer: Medicaid Other | Admitting: Internal Medicine

## 2015-04-29 ENCOUNTER — Encounter: Payer: Self-pay | Admitting: Internal Medicine

## 2015-04-29 VITALS — BP 124/81 | HR 74 | Temp 97.6°F | Wt 221.0 lb

## 2015-04-29 DIAGNOSIS — B20 Human immunodeficiency virus [HIV] disease: Secondary | ICD-10-CM

## 2015-04-29 DIAGNOSIS — O0991 Supervision of high risk pregnancy, unspecified, first trimester: Secondary | ICD-10-CM

## 2015-04-29 DIAGNOSIS — I10 Essential (primary) hypertension: Secondary | ICD-10-CM | POA: Diagnosis not present

## 2015-04-29 DIAGNOSIS — G43A1 Cyclical vomiting, intractable: Secondary | ICD-10-CM | POA: Diagnosis not present

## 2015-04-29 DIAGNOSIS — R1115 Cyclical vomiting syndrome unrelated to migraine: Secondary | ICD-10-CM

## 2015-04-29 NOTE — Telephone Encounter (Signed)
Call[facility] pt to [facility] (Wednesday) at 0930. Morning appt made d/t pt stating she was having trouble getting to afternoon appts. Left message for pt to call back to office if she could not make this appointment.

## 2015-04-29 NOTE — Progress Notes (Signed)
Subjective:    Patient ID: Catherine Simmons, female    DOB: 07-May-1985, 30 y.o.   MRN: [id number]  HPI  29yo F with HIV disease, currently [id number], currently at [redacted]W[redacted]d, due date in June 9th. She is doing ok, still having intermittent n/v taking antiemetics. Came in last week for labs, now undetectable. Cd 4 count 710/VL<20 (March 2017) on prezcobix/DRV. She is able to work still during her pregnancy. Her BP remains to be under good control thus far. She has finished taking her oral abtx for facial abscess that was i x d last week. She has upcoming ultrasound on 3/31.  She has her 53 month old son Catherine Simmons with her, and Catherine Simmons 30yo is at home with her other children. She states that she has help when her baby will be born.  Generally she has been induced early due to HTN  Allergies  Allergen Reactions  . Stadol [Butorphanol Tartrate] Itching   Current Outpatient Prescriptions on File Prior to Visit  Medication Sig Dispense Refill  . acetaminophen (TYLENOL) 500 MG tablet Take 1,000 mg by mouth every 6 (six) hours as needed for mild pain.    . cephALEXin (KEFLEX) 500 MG capsule Take 1 capsule (500 mg total) by mouth 4 (four) times daily. 28 capsule 0  . darunavir-cobicistat (PREZCOBIX) 800-150 MG tablet Take 1 tablet by mouth daily. Swallow whole. Do NOT crush, break or chew tablets. Take with food. 30 tablet 5  . emtricitabine-tenofovir (TRUVADA) 200-300 MG tablet Take 1 tablet by mouth daily. 30 tablet 5  . labetalol (NORMODYNE) 200 MG tablet Take 1 tablet (200 mg total) by mouth 2 (two) times daily. 60 tablet 3  . ondansetron (ZOFRAN-ODT) 8 MG disintegrating tablet Take 1 tablet (8 mg total) by mouth every 8 (eight) hours as needed for nausea or vomiting. 30 tablet 3  . aspirin 81 MG chewable tablet Chew 1 tablet (81 mg total) by mouth daily. (Patient not taking: Reported on 02/28/2015) 90 tablet 3  . sulfamethoxazole-trimethoprim (BACTRIM DS,SEPTRA DS) 800-160 MG tablet Take 1 tablet by  mouth 2 (two) times daily. (Patient not taking: Reported on 04/12/2015) 20 tablet 1   No current facility-administered medications on file prior to visit.   Active Ambulatory Problems    Diagnosis Date Noted  . OBESITY 05/09/2008  . SMOKER 05/09/2008  . DEPRESSION 05/09/2008  . CARPAL TUNNEL SYNDROME 11/14/2009  . NUMMULAR ECZEMA 11/14/2009  . Human immunodeficiency virus (HIV) disease (HCC) 10/12/2012  . Anxiety 06/30/2013  . Supervision of high-risk pregnancy 08/31/2013  . Hx of pelvic inflammatory disease 08/31/2013  . HIV disease affecting pregnancy, antepartum 11/16/2013  . Preexisting hypertension complicating pregnancy, antepartum   . Obesity affecting pregnancy in third trimester, antepartum   . Chronic hypertension 03/19/2014  . Marijuana use 12/26/2014   Resolved Ambulatory Problems    Diagnosis Date Noted  . GONOCOCCAL CERVICITIS 05/09/2008  . UNSPECIFIED DISORDER TEETH&SUPPORTING STRUCTURES 08/13/2009  . PYELONEPHRITIS 07/02/2006  . UNSPECIFIED VAGINITIS AND VULVOVAGINITIS 01/01/2010  . Leukorrhea, not specified as infective 09/10/2009  . Excessive or frequent menstruation 03/29/2007  . Dysuria 09/10/2009  . BROKEN TOOTH, INFECTED 08/22/2009  . GROUP B STREPTOCOCCUS CARRIER 02/06/2010  . Hypertension 04/10/2010  . Postpartum exam 04/10/2010  . Abdominal pain, other specified site 05/13/2010  . Chest pain 05/14/2010  . Cholelithiasis 05/28/2010  . PID (acute pelvic inflammatory disease) 10/17/2010  . UTI (lower urinary tract infection) 10/17/2010  . Atypical chest pain 03/18/2012  . Abdominal pain 03/18/2012  .  Pelvic pain 06/03/2012  . Boil, groin 08/16/2012  . Other disorder of menstruation and other abnormal bleeding from female genital tract 10/07/2012  . Possible exposure to STD 10/07/2012  . Body aches 01/26/2013  . Fracture dislocation of digit of hand 01/26/2013  . Nausea alone 01/26/2013  . Abscess of groin 04/24/2013  . HTN in pregnancy, chronic  08/31/2013  . Group B Streptococcus carrier, +RV culture, currently pregnant 03/01/2014  . [redacted] weeks gestation of pregnancy    Past Medical History  Diagnosis Date  . Cholecystitis 07/16/10  . HIV (human immunodeficiency virus infection) (HCC)   . Genital warts 2004  . Sickle cell trait (HCC)      Review of Systems + nausea/vomiting, intermittent. Otherwise 10 point ros is negative    Objective:   Physical Exam  BP 124/81 mmHg  Pulse 74  Temp(Src) 97.6 F (36.4 C) (Oral)  Wt 221 lb (100.245 kg)  LMP 10/12/2014 Physical Exam  Constitutional:  oriented to person, place, and time. appears well-developed and well-nourished. No distress.  HENT: [place]/AT, PERRLA, no scleral icterus Mouth/Throat: Oropharynx is clear and moist. No oropharyngeal exudate.  Cardiovascular: Normal rate, regular rhythm and normal heart sounds. Exam reveals no gallop and no friction rub.  No murmur heard.  Pulmonary/Chest: Effort normal and breath sounds normal. No respiratory distress.  has no wheezes.  Neck = supple, no nuchal rigidity Abdominal: Soft. Bowel sounds are normal.  exhibits no distension. There is no tenderness. +gravid Lymphadenopathy: no cervical adenopathy. No axillary adenopathy Neurological: alert and oriented to person, place, and time.  Skin: Skin is warm and dry. No rash noted. No erythema.  Psychiatric: a normal mood and affect.  behavior is normal.        Assessment & Plan:  hiv disease= well controlled now. She had detectable virus in the fall due to significant N/V during first trimester. She continues to tolerate prezcobix-DRV.  High risk pregnancy = will help her get next ob appt on the books. Has upcoming u/s. Continue on prenatal vitamins. When we called her ob, it appears that she has been missing her appts. Her next appt is on march 29th 9:30am   She says that she would like a tubal ligation, and stated that she was previously talked out the decision at her last pregnancy  due to being under 30 yo. Given that this is her 4th child,  I support her decision to get a tubal ligation, and she is a single mom, with minimum wage income with little family support.  htn = well controlled for now. We will still need monitoring during remaining part of pregnancy  Nausea/vomiting = continue with zofran prn

## 2015-05-04 ENCOUNTER — Inpatient Hospital Stay (HOSPITAL_COMMUNITY)
Admission: AD | Admit: 2015-05-04 | Discharge: 2015-05-05 | Disposition: A | Discharge status: Home / Self Care | Payer: Medicaid Other | Source: Ambulatory Visit | Attending: Obstetrics & Gynecology | Admitting: Obstetrics & Gynecology

## 2015-05-04 ENCOUNTER — Encounter (HOSPITAL_COMMUNITY): Payer: Self-pay

## 2015-05-04 DIAGNOSIS — Z7982 Long term (current) use of aspirin: Secondary | ICD-10-CM | POA: Insufficient documentation

## 2015-05-04 DIAGNOSIS — Z21 Asymptomatic human immunodeficiency virus [HIV] infection status: Secondary | ICD-10-CM | POA: Diagnosis not present

## 2015-05-04 DIAGNOSIS — F1290 Cannabis use, unspecified, uncomplicated: Secondary | ICD-10-CM

## 2015-05-04 DIAGNOSIS — O99013 Anemia complicating pregnancy, third trimester: Secondary | ICD-10-CM | POA: Insufficient documentation

## 2015-05-04 DIAGNOSIS — D573 Sickle-cell trait: Secondary | ICD-10-CM | POA: Insufficient documentation

## 2015-05-04 DIAGNOSIS — Z87891 Personal history of nicotine dependence: Secondary | ICD-10-CM | POA: Insufficient documentation

## 2015-05-04 DIAGNOSIS — Z3A29 29 weeks gestation of pregnancy: Secondary | ICD-10-CM | POA: Insufficient documentation

## 2015-05-04 DIAGNOSIS — E860 Dehydration: Secondary | ICD-10-CM | POA: Diagnosis not present

## 2015-05-04 DIAGNOSIS — O98713 Human immunodeficiency virus [HIV] disease complicating pregnancy, third trimester: Secondary | ICD-10-CM | POA: Diagnosis not present

## 2015-05-04 DIAGNOSIS — O212 Late vomiting of pregnancy: Secondary | ICD-10-CM | POA: Diagnosis present

## 2015-05-04 DIAGNOSIS — O219 Vomiting of pregnancy, unspecified: Secondary | ICD-10-CM

## 2015-05-04 DIAGNOSIS — O10013 Pre-existing essential hypertension complicating pregnancy, third trimester: Secondary | ICD-10-CM | POA: Diagnosis not present

## 2015-05-04 LAB — URINALYSIS W MICROSCOPIC (NOT AT ARMC)
BILIRUBIN URINE: NEGATIVE
Glucose, UA: NEGATIVE mg/dL
HGB URINE DIPSTICK: NEGATIVE
LEUKOCYTES UA: NEGATIVE
NITRITE: NEGATIVE
PROTEIN: NEGATIVE mg/dL
SPECIFIC GRAVITY, URINE: 1.02 (ref 1.005–1.030)
WBC UA: NONE SEEN WBC/hpf (ref 0–5)
pH: 5.5 (ref 5.0–8.0)

## 2015-05-04 MED ORDER — RANITIDINE HCL 150 MG PO TABS: 150.0000 mg | ORAL_TABLET | Freq: Two times a day (BID) | ORAL | Status: DC

## 2015-05-04 MED ORDER — GI COCKTAIL ~~LOC~~
30.0000 mL | Freq: Once | ORAL | Status: AC
  Administered 2015-05-04: 30 mL via ORAL
  Filled 2015-05-04: qty 30

## 2015-05-04 MED ORDER — FAMOTIDINE IN NACL 20-0.9 MG/50ML-% IV SOLN
20.0000 mg | Freq: Once | INTRAVENOUS | Status: AC
  Administered 2015-05-04: 20 mg via INTRAVENOUS
  Filled 2015-05-04: qty 50

## 2015-05-04 MED ORDER — LACTATED RINGERS IV BOLUS (SEPSIS)
1000.0000 mL | Freq: Once | INTRAVENOUS | Status: AC
  Administered 2015-05-04: 1000 mL via INTRAVENOUS

## 2015-05-04 MED ORDER — PROMETHAZINE HCL 12.5 MG PO TABS: 12.5000 mg | ORAL_TABLET | Freq: Four times a day (QID) | ORAL | Status: DC | PRN

## 2015-05-04 MED ORDER — PROMETHAZINE HCL 25 MG/ML IJ SOLN
25.0000 mg | Freq: Once | INTRAMUSCULAR | Status: AC
  Administered 2015-05-04: 25 mg via INTRAVENOUS
  Filled 2015-05-04: qty 1

## 2015-05-04 NOTE — Discharge Instructions (Signed)
Third Trimester of Pregnancy °The third trimester is from week 29 through week 42, months 7 through 9. The third trimester is a time when the fetus is growing rapidly. At the end of the ninth month, the fetus is about 20 inches in length and weighs 6-10 pounds.  °BODY CHANGES °Your body goes through many changes during pregnancy. The changes vary from woman to woman.  °· Your weight will continue to increase. You can expect to gain 25-35 pounds (11-16 kg) by the end of the pregnancy. °· You may begin to get stretch marks on your hips, abdomen, and breasts. °· You may urinate more often because the fetus is moving lower into your pelvis and pressing on your bladder. °· You may develop or continue to have heartburn as a result of your pregnancy. °· You may develop constipation because certain hormones are causing the muscles that push waste through your intestines to slow down. °· You may develop hemorrhoids or swollen, bulging veins (varicose veins). °· You may have pelvic pain because of the weight gain and pregnancy hormones relaxing your joints between the bones in your pelvis. Backaches may result from overexertion of the muscles supporting your posture. °· You may have changes in your hair. These can include thickening of your hair, rapid growth, and changes in texture. Some women also have hair loss during or after pregnancy, or hair that feels dry or thin. Your hair will most likely return to normal after your baby is born. °· Your breasts will continue to grow and be tender. A yellow discharge may leak from your breasts called colostrum. °· Your belly button may stick out. °· You may feel short of breath because of your expanding uterus. °· You may notice the fetus "dropping," or moving lower in your abdomen. °· You may have a bloody mucus discharge. This usually occurs a few days to a week before labor begins. °· Your cervix becomes thin and soft (effaced) near your due date. °WHAT TO EXPECT AT YOUR PRENATAL  EXAMS  °You will have prenatal exams every 2 weeks until week 36. Then, you will have weekly prenatal exams. During a routine prenatal visit: °· You will be weighed to make sure you and the fetus are growing normally. °· Your blood pressure is taken. °· Your abdomen will be measured to track your baby's growth. °· The fetal heartbeat will be listened to. °· Any test results from the previous visit will be discussed. °· You may have a cervical check near your due date to see if you have effaced. °At around 36 weeks, your caregiver will check your cervix. At the same time, your caregiver will also perform a test on the secretions of the vaginal tissue. This test is to determine if a type of bacteria, Group B streptococcus, is present. Your caregiver will explain this further. °Your caregiver may ask you: °· What your birth plan is. °· How you are feeling. °· If you are feeling the baby move. °· If you have had any abnormal symptoms, such as leaking fluid, bleeding, severe headaches, or abdominal cramping. °· If you are using any tobacco products, including cigarettes, chewing tobacco, and electronic cigarettes. °· If you have any questions. °Other tests or screenings that may be performed during your third trimester include: °· Blood tests that check for low iron levels (anemia). °· Fetal testing to check the health, activity level, and growth of the fetus. Testing is done if you have certain medical conditions or if   there are problems during the pregnancy. °· HIV (human immunodeficiency virus) testing. If you are at high risk, you may be screened for HIV during your third trimester of pregnancy. °FALSE LABOR °You may feel small, irregular contractions that eventually go away. These are called Braxton Hicks contractions, or false labor. Contractions may last for hours, days, or even weeks before true labor sets in. If contractions come at regular intervals, intensify, or become painful, it is best to be seen by your  caregiver.  °SIGNS OF LABOR  °· Menstrual-like cramps. °· Contractions that are 5 minutes apart or less. °· Contractions that start on the top of the uterus and spread down to the lower abdomen and back. °· A sense of increased pelvic pressure or back pain. °· A watery or bloody mucus discharge that comes from the vagina. °If you have any of these signs before the 37th week of pregnancy, call your caregiver right away. You need to go to the hospital to get checked immediately. °HOME CARE INSTRUCTIONS  °· Avoid all smoking, herbs, alcohol, and unprescribed drugs. These chemicals affect the formation and growth of the baby. °· Do not use any tobacco products, including cigarettes, chewing tobacco, and electronic cigarettes. If you need help quitting, ask your health care provider. You may receive counseling support and other resources to help you quit. °· Follow your caregiver's instructions regarding medicine use. There are medicines that are either safe or unsafe to take during pregnancy. °· Exercise only as directed by your caregiver. Experiencing uterine cramps is a good sign to stop exercising. °· Continue to eat regular, healthy meals. °· Wear a good support bra for breast tenderness. °· Do not use hot tubs, steam rooms, or saunas. °· Wear your seat belt at all times when driving. °· Avoid raw meat, uncooked cheese, cat litter boxes, and soil used by cats. These carry germs that can cause birth defects in the baby. °· Take your prenatal vitamins. °· Take 1500-2000 mg of calcium daily starting at the 20th week of pregnancy until you deliver your baby. °· Try taking a stool softener (if your caregiver approves) if you develop constipation. Eat more high-fiber foods, such as fresh vegetables or fruit and whole grains. Drink plenty of fluids to keep your urine clear or pale yellow. °· Take warm sitz baths to soothe any pain or discomfort caused by hemorrhoids. Use hemorrhoid cream if your caregiver approves. °· If  you develop varicose veins, wear support hose. Elevate your feet for 15 minutes, 3-4 times a day. Limit salt in your diet. °· Avoid heavy lifting, wear low heal shoes, and practice good posture. °· Rest a lot with your legs elevated if you have leg cramps or low back pain. °· Visit your dentist if you have not gone during your pregnancy. Use a soft toothbrush to brush your teeth and be gentle when you floss. °· A sexual relationship may be continued unless your caregiver directs you otherwise. °· Do not travel far distances unless it is absolutely necessary and only with the approval of your caregiver. °· Take prenatal classes to understand, practice, and ask questions about the labor and delivery. °· Make a trial run to the hospital. °· Pack your hospital bag. °· Prepare the baby's nursery. °· Continue to go to all your prenatal visits as directed by your caregiver. °SEEK MEDICAL CARE IF: °· You are unsure if you are in labor or if your water has broken. °· You have dizziness. °· You have   mild pelvic cramps, pelvic pressure, or nagging pain in your abdominal area.  You have persistent nausea, vomiting, or diarrhea.  You have a bad smelling vaginal discharge.  You have pain with urination. SEEK IMMEDIATE MEDICAL CARE IF:   You have a fever.  You are leaking fluid from your vagina.  You have spotting or bleeding from your vagina.  You have severe abdominal cramping or pain.  You have rapid weight loss or gain.  You have shortness of breath with chest pain.  You notice sudden or extreme swelling of your face, hands, ankles, feet, or legs.  You have not felt your baby move in over an hour.  You have severe headaches that do not go away with medicine.  You have vision changes.   This information is not intended to replace advice given to you by your health care provider. Make sure you discuss any questions you have with your health care provider.   Document Released: 01/20/2001 Document  Revised: 02/16/2014 Document Reviewed: 03/29/2012 Elsevier Interactive Patient Education 2016 Else[employer] Inc[employer] Morning Sickness Morning sickness is when you feel sick to your stomach (nauseous) during pregnancy. You may feel sick to your stomach and throw up (vomit). You may feel sick in the morning, but you can feel this way any time of day. Some women feel very sick to their stomach and cannot stop throwing up (hyperemesis gravidarum). HOME CARE  Only take medicines as told by your doctor.  Take multivitamins as told by your doctor. Taking multivitamins before getting pregnant can stop or lessen the harshness of morning sickness.  Eat dry toast or unsalted crackers before getting out of bed.  Eat 5 to 6 small meals a day.  Eat dry and bland foods like rice and baked potatoes.  Do not drink liquids with meals. Drink between meals.  Do not eat greasy, fatty, or spicy foods.  Have someone cook for you if the smell of food causes you to feel sick or throw up.  If you feel sick to your stomach after taking prenatal vitamins, take them at night or with a snack.  Eat protein when you need a snack (nuts, yogurt, cheese).  Eat unsweetened gelatins for dessert.  Wear a bracelet used for sea sickness (acupressure wristband).  Go to a doctor that puts thin needles into certain body points (acupuncture) to improve how you feel.  Do not smoke.  Use a humidifier to keep the air in your house free of odors.  Get lots of fresh air. GET HELP IF:  You need medicine to feel better.  You feel dizzy or lightheaded.  You are losing weight. GET HELP RIGHT AWAY IF:   You feel very sick to your stomach and cannot stop throwing up.  You pass out (faint). MAKE SURE YOU:  Understand these instructions.  Will watch your condition.  Will get help right away if you are not doing well or get worse.   This information is not intended to replace advice given to you by your health care provider.  Make sure you discuss any questions you have with your health care provider.   Document Released: 03/05/2004 Document Revised: 02/16/2014 Document Reviewed: 07/13/2012 Elsevier Interactive Patient Education [employer]2016 Elsevier Inc.

## 2015-05-04 NOTE — MAU Note (Signed)
Vomiting since this am. Taken 4-5 Zofran and not helping. Dizzy. Have had n/v entire preg but Zofran usually works. No diarrhea. Denies vag bleeding or LOF

## 2015-05-04 NOTE — MAU Provider Note (Signed)
MAU provider Note  CSN: [id number] Arrival date and time: 05/04/15 1904  Chief Complaint  Patient presents with  . Emesis During Pregnancy  . Back Pain   HPI  History   Ms. Anibal HendersonMarkita Kielty is a 30 y/o female that is 29wk1d [phone] that presents with nausea and intractable vomiting.  Around 0200 ate cheeseburger.  Awoke at 0700 with "heart burn" and nausea.  Soon after began vomiting.  Has had 15-20 episodes of vomiting.  Has taken zofran which has not alleviated symptoms. Denies hematemesis.  Currently denies fever, abdominal pain, diarrhea, dysruria, URI symptoms, dyspnea, or CP. Patient has not had sick contacts.  No other individual sick after food ingestion.   FWB: FM+; denies vaginal fluid leak or bleeding.  Category 1 tracing.  PMH includes HIV s/p antiretroviral medication.  Per patient viral load undectable and Tcell 800s; not verified on record.   Prenatal problem list includes:high-risk pregnancy and has OBESITY; SMOKER; DEPRESSION; CARPAL TUNNEL SYNDROME; NUMMULAR ECZEMA; Human immunodeficiency virus (HIV) disease (HCC); Anxiety; Supervision of high-risk pregnancy; Hx of pelvic inflammatory disease; HIV disease affecting pregnancy, antepartum; Preexisting hypertension complicating pregnancy, antepartum; Obesity affecting pregnancy in third trimester, antepartum; Chronic hypertension; and Marijuana use   Past Medical History  Diagnosis Date  . Cholecystitis 07/16/10  . Hypertension   . HIV (human immunodeficiency virus infection) (HCC)   . Genital warts 2004  . Sickle cell trait [facility](HCC)     Past Surgical History  Procedure Laterality Date  . Cholecystectomy    . Wisdom tooth extraction      Family History  Problem Relation Age of Onset  . Cancer Mother   . Diabetes Maternal Aunt     Social History  Substance Use Topics  . Smoking status: Former Smoker    Start date: 03/11/2012    Quit date: 08/24/2012  . Smokeless tobacco: Never Used  . Alcohol Use: No     Allergies:  Allergies  Allergen Reactions  . Stadol [Butorphanol Tartrate] Itching    Prescriptions prior to admission  Medication Sig Dispense Refill Last Dose  . acetaminophen (TYLENOL) 500 MG tablet Take 1,000 mg by mouth every 6 (six) hours as needed for mild pain or headache.    05/04/2015 at 1100  . darunavir-cobicistat (PREZCOBIX) 800-150 MG tablet Take 1 tablet by mouth at bedtime. Swallow whole. Do NOT crush, break or chew tablets. Take with food.   05/03/2015 at Unknown time  . emtricitabine-tenofovir (TRUVADA) 200-300 MG tablet Take 1 tablet by mouth at bedtime.   05/03/2015 at Unknown time  . labetalol (NORMODYNE) 200 MG tablet Take 1 tablet (200 mg total) by mouth 2 (two) times daily. 60 tablet 3 Past Week at Unknown time  . labetalol (NORMODYNE) 200 MG tablet Take 200 mg by mouth at bedtime.   Past Week at Unknown time  . ondansetron (ZOFRAN-ODT) 8 MG disintegrating tablet Take 1 tablet (8 mg total) by mouth every 8 (eight) hours as needed for nausea or vomiting. 30 tablet 3 05/04/2015 at Unknown time  . Prenatal Vit-Min-FA-Fish Oil ([facility] PRENATAL GUMMY) 0.4-113.5 MG CHEW Chew 2 each by mouth daily.   05/03/2015 at Unknown time  . aspirin 81 MG chewable tablet Chew 1 tablet (81 mg total) by mouth daily. (Patient not taking: Reported on 02/28/2015) 90 tablet 3 Not Taking  . cephALEXin (KEFLEX) 500 MG capsule Take 1 capsule (500 mg total) by mouth 4 (four) times daily. (Patient not taking: Reported on 05/04/2015) 28 capsule 0 Taking  . sulfamethoxazole-trimethoprim (  BACTRIM DS,SEPTRA DS) 800-160 MG tablet Take 1 tablet by mouth 2 (two) times daily. (Patient not taking: Reported on 04/12/2015) 20 tablet 1 Not Taking    ROS Physical Exam   Blood pressure 144/86, pulse 81, temperature 98.2 F (36.8 C), resp. rate 18, height  (1.575 m), weight 218 lb (98.884 kg), last menstrual period 10/12/2014, not currently breastfeeding.  Physical Exam  Gen: NAD HEENT: tacky MM CARDS: RRR;  S1,S2. No r/g/m Pul: CTABL. Abd: S;NT;ND. IUP.  MAU Course  Procedures UA-80 ketones; no nitrites or leukocytes.  Assessment and Plan  Ms. Amaree Loisel is a 30 y/o female that is 29wk1d [phone] that presents with mild dehydration and nausea with intractable vomiting 2/2 nausea and vomiting of pregnancy.  -Patient given phennergan, 2L LR bolus, and ranitidine.  Patient has passed PO challenge. -Will be discharged with phennergan -Continue routine prenatal care. -Patient will follow-up at next routine visit.    Nolon Bussing [place] 05/04/2015, 8:49 PM   OB fellow attestation: I have seen and examined this patient; I agree with above documentation in the resident's note.   ALIZIA GREIF is a 30 y.o. [phone] reporting nausea and vomiting +FM, denies LOF, VB, contractions, vaginal discharge.  PE: BP 110/62 mmHg  Pulse 80  Temp(Src) 98.5 F (36.9 C) (Oral)  Resp 16  Ht  (1.575 m)  Wt 218 lb (98.884 kg)  BMI 39.86 kg/m2  LMP 10/12/2014 Gen: calm comfortable, NAD Resp: normal effort, no distress Abd: gravid  ROS, labs, PMH reviewed NST reactive   Plan: -IVF in MAU (2L LR) -phenergan in MAU and on d/c -GI cocktail in MAU   Federico Flake, MD 1:14 AM

## 2015-05-05 DIAGNOSIS — Z21 Asymptomatic human immunodeficiency virus [HIV] infection status: Secondary | ICD-10-CM | POA: Diagnosis not present

## 2015-05-05 DIAGNOSIS — E860 Dehydration: Secondary | ICD-10-CM

## 2015-05-05 DIAGNOSIS — Z3A29 29 weeks gestation of pregnancy: Secondary | ICD-10-CM

## 2015-05-05 DIAGNOSIS — O219 Vomiting of pregnancy, unspecified: Secondary | ICD-10-CM | POA: Diagnosis not present

## 2015-05-05 DIAGNOSIS — O98713 Human immunodeficiency virus [HIV] disease complicating pregnancy, third trimester: Secondary | ICD-10-CM | POA: Diagnosis not present

## 2015-05-08 ENCOUNTER — Telehealth: Payer: Self-pay | Admitting: Family

## 2015-05-08 ENCOUNTER — Encounter: Payer: Self-pay | Admitting: General Practice

## 2015-05-08 ENCOUNTER — Encounter: Payer: Self-pay | Admitting: Family

## 2015-05-08 ENCOUNTER — Ambulatory Visit (INDEPENDENT_AMBULATORY_CARE_PROVIDER_SITE_OTHER): Payer: Medicaid Other | Admitting: Family

## 2015-05-08 ENCOUNTER — Encounter: Payer: Medicaid Other | Admitting: Family

## 2015-05-08 VITALS — BP 124/72 | HR 79 | Temp 98.9°F | Wt 218.9 lb

## 2015-05-08 DIAGNOSIS — Z21 Asymptomatic human immunodeficiency virus [HIV] infection status: Secondary | ICD-10-CM | POA: Diagnosis not present

## 2015-05-08 DIAGNOSIS — O99342 Other mental disorders complicating pregnancy, second trimester: Secondary | ICD-10-CM

## 2015-05-08 DIAGNOSIS — D649 Anemia, unspecified: Secondary | ICD-10-CM

## 2015-05-08 DIAGNOSIS — O10912 Unspecified pre-existing hypertension complicating pregnancy, second trimester: Secondary | ICD-10-CM

## 2015-05-08 DIAGNOSIS — Z23 Encounter for immunization: Secondary | ICD-10-CM | POA: Diagnosis not present

## 2015-05-08 DIAGNOSIS — F329 Major depressive disorder, single episode, unspecified: Secondary | ICD-10-CM | POA: Diagnosis not present

## 2015-05-08 DIAGNOSIS — O98712 Human immunodeficiency virus [HIV] disease complicating pregnancy, second trimester: Secondary | ICD-10-CM | POA: Diagnosis present

## 2015-05-08 DIAGNOSIS — O99212 Obesity complicating pregnancy, second trimester: Secondary | ICD-10-CM | POA: Diagnosis not present

## 2015-05-08 DIAGNOSIS — O98719 Human immunodeficiency virus [HIV] disease complicating pregnancy, unspecified trimester: Secondary | ICD-10-CM

## 2015-05-08 DIAGNOSIS — O99012 Anemia complicating pregnancy, second trimester: Secondary | ICD-10-CM | POA: Diagnosis not present

## 2015-05-08 DIAGNOSIS — O99332 Smoking (tobacco) complicating pregnancy, second trimester: Secondary | ICD-10-CM

## 2015-05-08 DIAGNOSIS — E669 Obesity, unspecified: Secondary | ICD-10-CM | POA: Diagnosis not present

## 2015-05-08 DIAGNOSIS — O99019 Anemia complicating pregnancy, unspecified trimester: Secondary | ICD-10-CM

## 2015-05-08 DIAGNOSIS — F17210 Nicotine dependence, cigarettes, uncomplicated: Secondary | ICD-10-CM | POA: Diagnosis not present

## 2015-05-08 DIAGNOSIS — O0993 Supervision of high risk pregnancy, unspecified, third trimester: Secondary | ICD-10-CM

## 2015-05-08 LAB — CBC
HCT: 28.9 % — ABNORMAL LOW (ref 36.0–46.0)
Hemoglobin: 9.5 g/dL — ABNORMAL LOW (ref 12.0–15.0)
MCH: 25.5 pg — ABNORMAL LOW (ref 26.0–34.0)
MCHC: 32.9 g/dL (ref 30.0–36.0)
MCV: 77.7 fL — ABNORMAL LOW (ref 78.0–100.0)
MPV: 9.5 fL (ref 8.6–12.4)
Platelets: 231 10*3/uL (ref 150–400)
RBC: 3.72 MIL/uL — ABNORMAL LOW (ref 3.87–5.11)
RDW: 14 % (ref 11.5–15.5)
WBC: 5.4 10*3/uL (ref 4.0–10.5)

## 2015-05-08 LAB — POCT URINALYSIS DIP (DEVICE)
BILIRUBIN URINE: NEGATIVE
GLUCOSE, UA: NEGATIVE mg/dL
HGB URINE DIPSTICK: NEGATIVE
KETONES UR: NEGATIVE mg/dL
Nitrite: NEGATIVE
Protein, ur: NEGATIVE mg/dL
SPECIFIC GRAVITY, URINE: 1.01 (ref 1.005–1.030)
Urobilinogen, UA: 0.2 mg/dL (ref 0.0–1.0)
pH: 6 (ref 5.0–8.0)

## 2015-05-08 MED ORDER — FUSION PLUS PO CAPS: 1.0000 | ORAL_CAPSULE | Freq: Every day | ORAL | Status: DC

## 2015-05-08 MED ORDER — TETANUS-DIPHTH-ACELL PERTUSSIS 5-2.5-18.5 LF-MCG/0.5 IM SUSP
0.5000 mL | Freq: Once | INTRAMUSCULAR | Status: AC
  Administered 2015-05-08: 0.5 mL via INTRAMUSCULAR

## 2015-05-08 NOTE — Progress Notes (Signed)
28 wk labs today  Pt reports dizziness and fatigue; Headaches- Tylenol not effective   Pt also reports nausea Zofran/Phenergan not effective  Pt in MAU on Saturday for dehydration

## 2015-05-08 NOTE — Telephone Encounter (Signed)
Patient came in the morning  for a follow up appt,  She had her child with her and due to Restrictions of the Flu, No Children under 12 are allowed in the Hospital, Patient got upset and stormed out of the office.

## 2015-05-08 NOTE — Progress Notes (Signed)
Subjective:  Catherine MccallumMarkita L Simmons is a 30 y.o. [phone] at 7459w5d being seen today for ongoing prenatal care.  She is currently monitored for the following issues for this high-risk pregnancy and has OBESITY; SMOKER; DEPRESSION; CARPAL TUNNEL SYNDROME; NUMMULAR ECZEMA; Human immunodeficiency virus (HIV) disease (HCC); Anxiety; Supervision of high-risk pregnancy; Hx of pelvic inflammatory disease; HIV disease affecting pregnancy, antepartum; Preexisting hypertension complicating pregnancy, antepartum; Obesity affecting pregnancy in third trimester, antepartum; Chronic hypertension; and Marijuana use on her problem list.  Patient reports fatigue, nausea, vomiting and intermittent headaches.  Reports vomiting has improved.  Vomited x 1 in past 24 hours.  Denies fever.  .  Contractions: Not present. Vag. Bleeding: None.  Movement: Present. Denies leaking of fluid.   The following portions of the patient's history were reviewed and updated as appropriate: allergies, current medications, past family history, past medical history, past social history, past surgical history and problem list. Problem list updated.  Objective:   Filed Vitals:   05/08/15 0943  BP: 124/72  Pulse: 79  Temp: 98.9 F (37.2 C)  Weight: 218 lb 14.4 oz (99.292 kg)    Fetal Status: Fetal Heart Rate (bpm): 147 Fundal Height: 30 cm Movement: Present     General:  Alert, oriented and cooperative. Patient is in no acute distress.  Skin: Skin is warm and dry. No rash noted.   Cardiovascular: Normal heart rate noted  Respiratory: Normal respiratory effort, no problems with respiration noted  Abdomen: Soft, gravid, appropriate for gestational age. Pain/Pressure: Present     Pelvic: Vag. Bleeding: None     Cervical exam deferred        Extremities: Normal range of motion.  Edema: None  Mental Status: Normal mood and affect. Normal behavior. Normal judgment and thought content.   Urinalysis: Urine Protein: Negative Urine Glucose:  Negative  Assessment and Plan:  Pregnancy: G4P3003 at 5559w5d  1. HIV disease affecting pregnancy, antepartum, unspecified trimester - Continue meds  2. Supervision of high-risk pregnancy, third trimester - Glucose Tolerance, 1 HR (50g) w/o Fasting - RPR - CBC - BTL consent signed  3. Preexisting hypertension complicating pregnancy, antepartum, second trimester - Growth ultrasound this Friday  4. Anemia affecting pregnancy - Iron-FA-B Cmp-C-Biot-Probiotic (FUSION PLUS) CAPS; Take 1 capsule by mouth daily.  Dispense: 30 capsule; Refill: 6  5.  Poor weight gain in Pregnancy - [facility] - Visit with nutritionalist  Preterm labor symptoms and general obstetric precautions including but not limited to vaginal bleeding, contractions, leaking of fluid and fetal movement were reviewed in detail with the patient. Please refer to After Visit Summary for other counseling recommendations.  Return for appt and nutrition.   Catherine FarberWalidah Kennith GainN Simmons, CNM

## 2015-05-09 LAB — RPR

## 2015-05-09 LAB — GLUCOSE TOLERANCE, 1 HOUR (50G) W/O FASTING: Glucose, 1 Hr, gestational: 86 mg/dL (ref <140)

## 2015-05-10 ENCOUNTER — Ambulatory Visit (HOSPITAL_COMMUNITY)
Admission: RE | Admit: 2015-05-10 | Discharge: 2015-05-10 | Disposition: A | Discharge status: Home / Self Care | Payer: Medicaid Other | Source: Ambulatory Visit | Attending: Family Medicine | Admitting: Family Medicine

## 2015-05-10 ENCOUNTER — Other Ambulatory Visit (HOSPITAL_COMMUNITY): Payer: Self-pay | Admitting: Obstetrics and Gynecology

## 2015-05-10 ENCOUNTER — Encounter (HOSPITAL_COMMUNITY): Payer: Self-pay

## 2015-05-10 VITALS — BP 131/81 | HR 81 | Wt 226.4 lb

## 2015-05-10 DIAGNOSIS — O09293 Supervision of pregnancy with other poor reproductive or obstetric history, third trimester: Secondary | ICD-10-CM | POA: Diagnosis not present

## 2015-05-10 DIAGNOSIS — O10919 Unspecified pre-existing hypertension complicating pregnancy, unspecified trimester: Secondary | ICD-10-CM

## 2015-05-10 DIAGNOSIS — O10013 Pre-existing essential hypertension complicating pregnancy, third trimester: Secondary | ICD-10-CM | POA: Diagnosis not present

## 2015-05-10 DIAGNOSIS — O99213 Obesity complicating pregnancy, third trimester: Secondary | ICD-10-CM

## 2015-05-10 DIAGNOSIS — Z3A30 30 weeks gestation of pregnancy: Secondary | ICD-10-CM

## 2015-05-10 DIAGNOSIS — F1290 Cannabis use, unspecified, uncomplicated: Secondary | ICD-10-CM

## 2015-05-10 DIAGNOSIS — O98713 Human immunodeficiency virus [HIV] disease complicating pregnancy, third trimester: Secondary | ICD-10-CM

## 2015-05-10 DIAGNOSIS — O10913 Unspecified pre-existing hypertension complicating pregnancy, third trimester: Secondary | ICD-10-CM

## 2015-05-10 DIAGNOSIS — Z21 Asymptomatic human immunodeficiency virus [HIV] infection status: Secondary | ICD-10-CM

## 2015-05-10 IMAGING — US US MFM OB FOLLOW-UP
1 series · 14 of 28 positions shown · non-contrast
Comparison: none

[Series 1: us mfm ob follow-up · 14 of 32 slices shown]
[im 2/32]
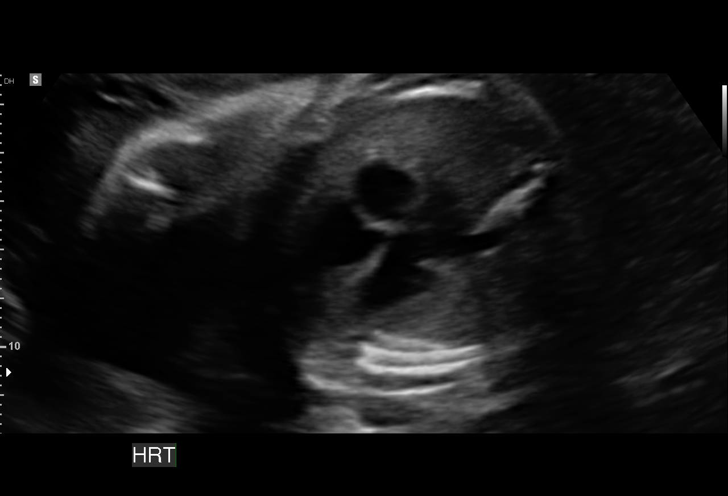
[im 4/32]
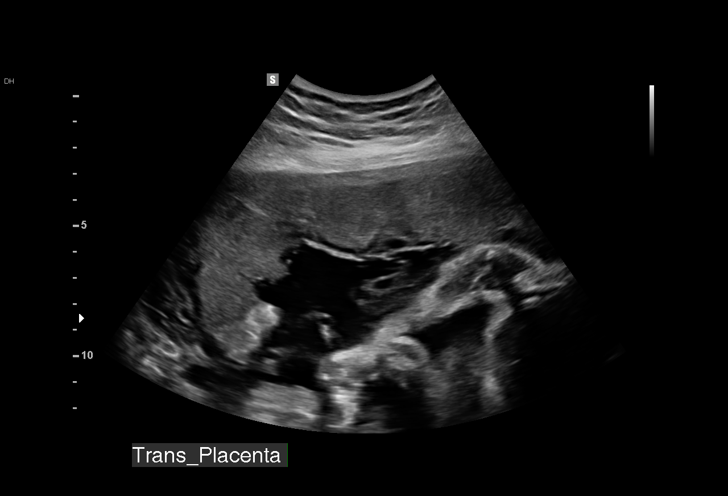
[im 6/32]
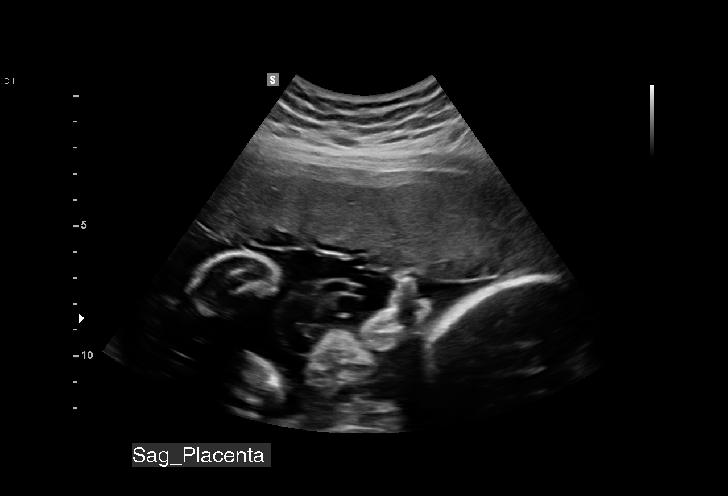
[im 9/32]
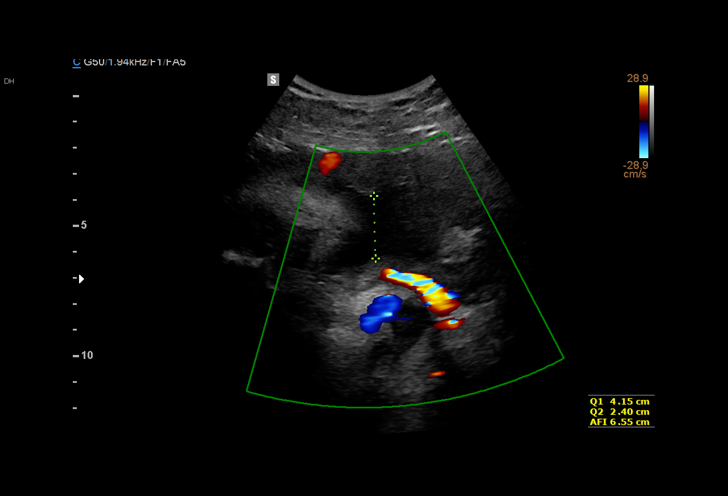
[im 11/32]
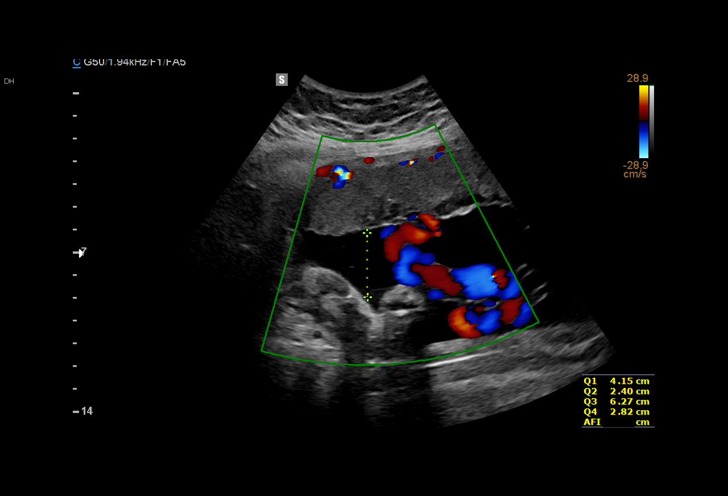
[im 13/32]
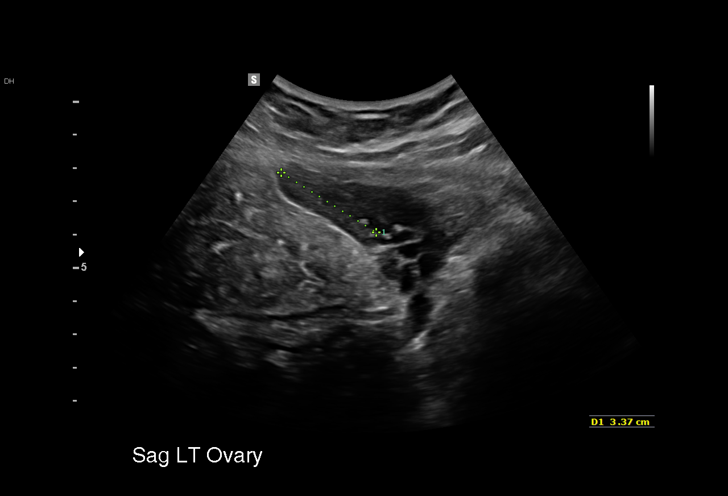
[im 15/32]
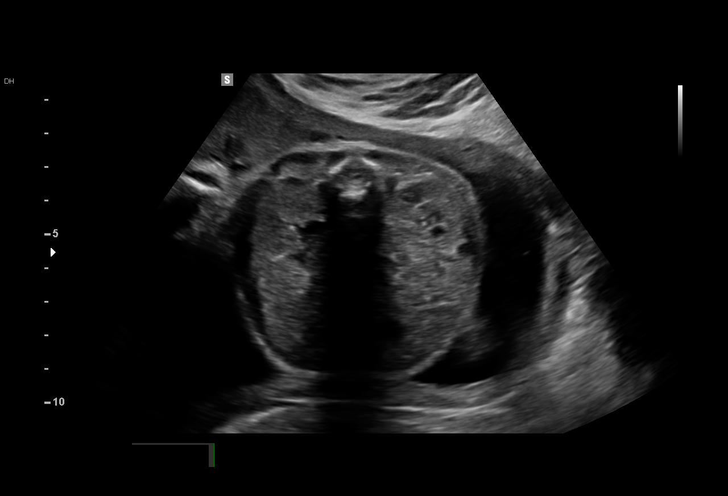
[im 18/32]
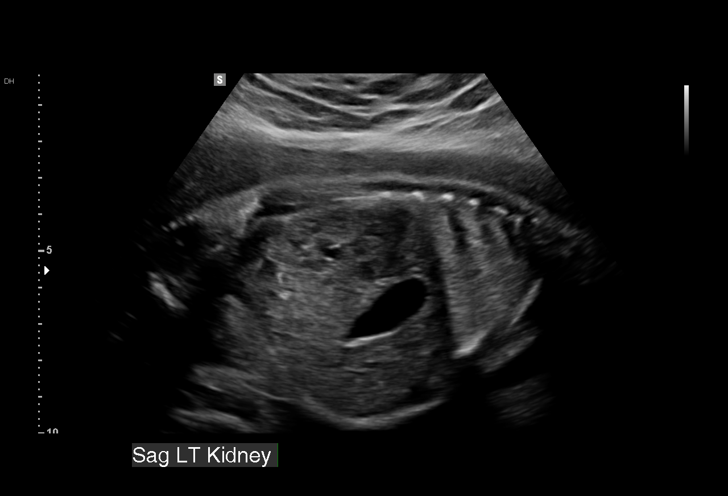
[im 20/32]
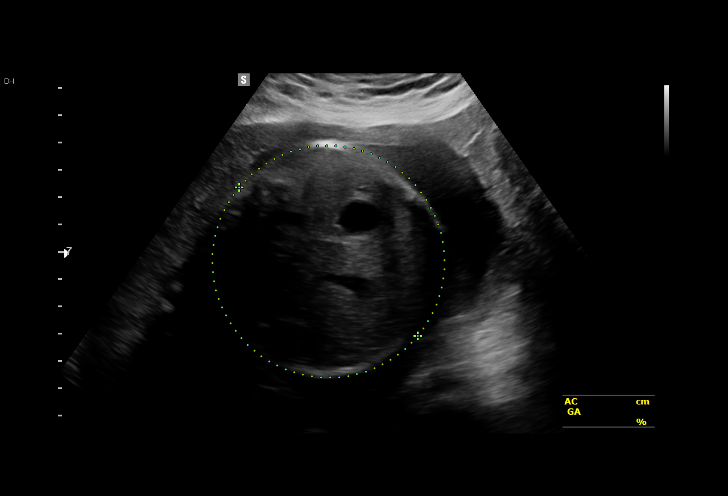
[im 22/32]
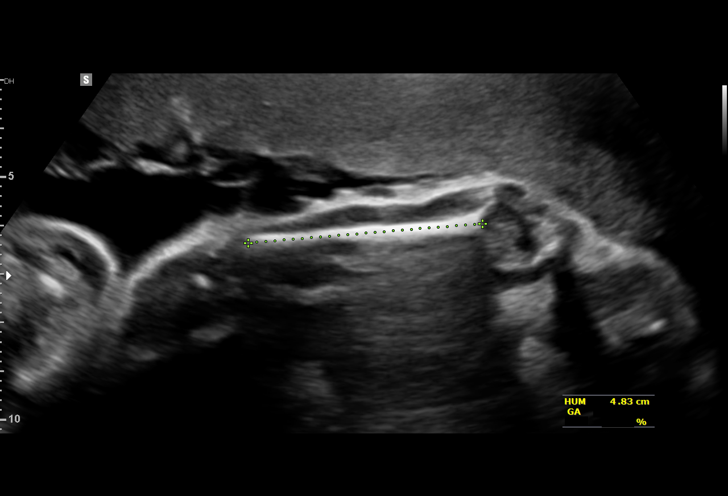
[im 25/32]
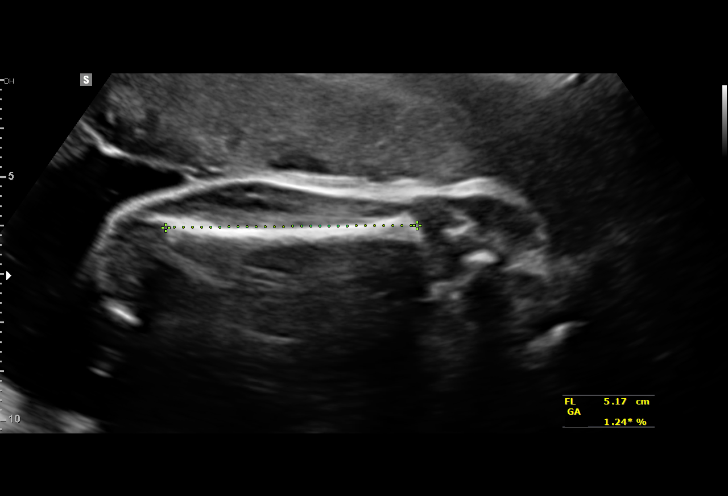
[im 27/32]
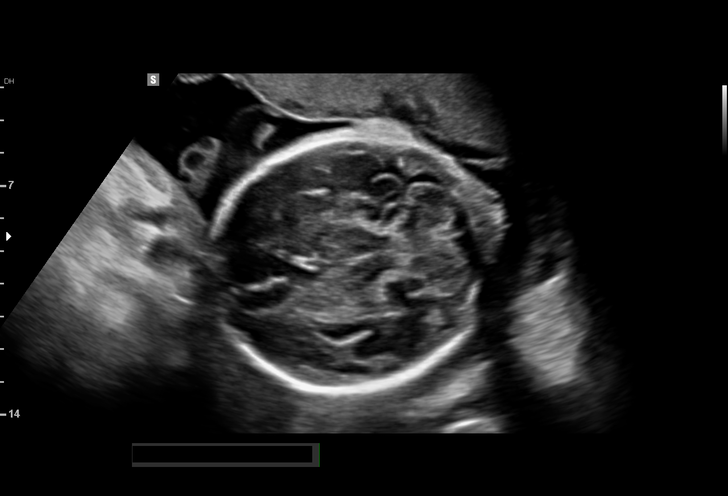
[im 29/32]
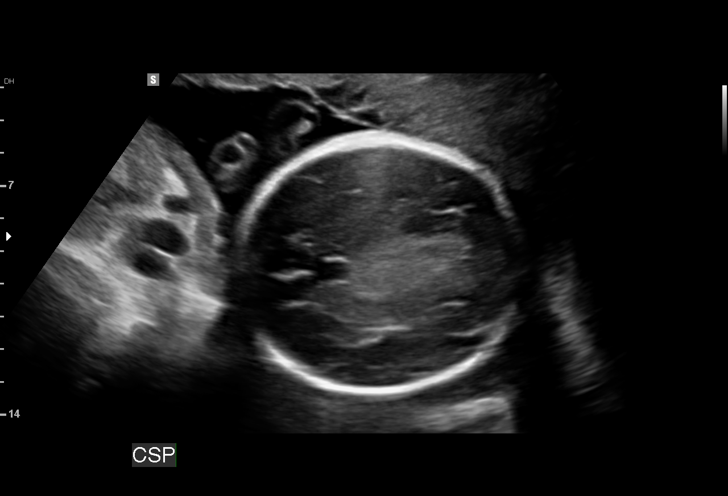
[im 32/32]
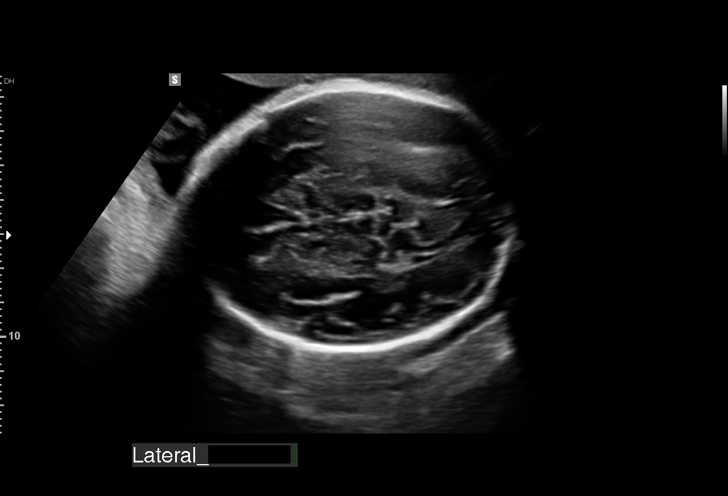

[14 of 28 positions shown; findings below may reference images not displayed]

Hospital Clinic-
Faculty Physician
OB/Gyn Clinic

Indications

30 weeks gestation of pregnancy
HIV affecting pregnancy, third trimester       [8G]
Hypertension - Chronic/Pre-existing (on        [8G]
labetalol)
Poor obstetric history: Previous               [8G]
preeclampsia / eclampsia/gestational HTN
Obesity complicating pregnancy, third          [8G]
trimester
OB History

Height:       5'2"    Weight:   224       BMI:
Gravidity:    4         Term:   3        Prem:   0        SAB:   0
TOP:          0       Ectopic:  0        Living: 3
Fetal Evaluation

Num Of Fetuses:     1
Fetal Heart         136
Rate(bpm):
Cardiac Activity:   Observed
Presentation:       Cephalic
Placenta:           Anterior, above cervical os
P. Cord Insertion:  Previously Visualized
Amniotic Fluid
AFI FV:      Subjectively within normal limits
AFI Sum:     15.64   cm      56   %Tile     Larg Pckt:   6.27   cm
RUQ:   4.15   cm    RLQ:    2.82   cm    LUQ:   2.4     cm   LLQ:    6.27   cm
Biometry

BPD:      76.2  mm     G. Age:  30w 4d                  CI:        78.88   %   70 - 86
FL/HC:      18.8   %   19.2 -
HC:      271.3  mm     G. Age:  29w 4d         10  %    HC/AC:      1.00       0.99 -
AC:      270.8  mm     G. Age:  31w 1d         78  %    FL/BPD:     66.9   %   71 - 87
FL:         51  mm     G. Age:  27w 2d        < 3  %    FL/AC:      18.8   %   20 - 24
HUM:      48.6  mm     G. Age:  28w 4d         23  %
CER:      35.9  mm     G. Age:  30w 6d         60  %
CM:        5.1  mm

Est. FW:    [8G]  gm      3 lb 3 oz     49  %
Gestational Age

LMP:           30w 0d       Date:   [DATE]                 EDD:   [DATE]
U/S Today:     29w 5d                                        EDD:   [DATE]
Best:          30w 0d    Det. By:   LMP  ([DATE])          EDD:   [DATE]
Anatomy

Cranium:          Appears normal         Aortic Arch:      Previously seen
Fetal Cavum:      Appears normal         Ductal Arch:      Previously seen
Ventricles:       Appears normal         Diaphragm:        Previously seen
Choroid Plexus:   Previously seen        Stomach:          Appears normal, left
sided
Cerebellum:       Appears normal         Abdomen:          Appears normal
Posterior Fossa:  Appears normal         Abdominal Wall:   Previously seen
Nuchal Fold:      Previously seen        Cord Vessels:     Previously seen
Face:             Orbits and profile     Kidneys:          Appear normal
previously seen
Lips:             Previously seen        Bladder:          Appears normal
Fetal Thoracic:   Appears normal         Spine:            Previously seen
Heart:            Appears normal         Upper             Previously seen
(4CH, axis, and        Extremities:
situs)
RVOT:             Previously seen        Lower             Previously seen
Extremities:
LVOT:             Previously seen

Other:  Male gender previously seen. Heels and 5th digit previously
visualized. Nasal bone previously visualized.  Technically difficult due
to fetal position.
Cervix Uterus Adnexa

Cervix
Not visualized (advanced GA >[8G])

Left Ovary
Within normal limits.

Right Ovary
Within normal limits.

Adnexa:       No abnormality visualized.
Impression

SIUP at 30+0 weeks
Normal interval anatomy; anatomic survey complete
Normal amniotic fluid volume
Appropriate interval growth with EFW at the 49th %tile
Recommendations

Follow-up ultrasound for growth in 4 weeks

## 2015-05-13 ENCOUNTER — Other Ambulatory Visit: Payer: Self-pay | Admitting: Family

## 2015-05-13 ENCOUNTER — Encounter: Payer: Self-pay | Admitting: Family

## 2015-05-13 DIAGNOSIS — O99012 Anemia complicating pregnancy, second trimester: Secondary | ICD-10-CM | POA: Insufficient documentation

## 2015-05-15 ENCOUNTER — Encounter: Payer: Self-pay | Admitting: Advanced Practice Midwife

## 2015-05-15 ENCOUNTER — Ambulatory Visit (INDEPENDENT_AMBULATORY_CARE_PROVIDER_SITE_OTHER): Payer: Medicaid Other | Admitting: Advanced Practice Midwife

## 2015-05-15 VITALS — BP 121/78 | HR 79 | Temp 98.2°F | Wt 227.4 lb

## 2015-05-15 DIAGNOSIS — O98713 Human immunodeficiency virus [HIV] disease complicating pregnancy, third trimester: Secondary | ICD-10-CM | POA: Diagnosis not present

## 2015-05-15 DIAGNOSIS — O9A213 Injury, poisoning and certain other consequences of external causes complicating pregnancy, third trimester: Secondary | ICD-10-CM

## 2015-05-15 DIAGNOSIS — O99343 Other mental disorders complicating pregnancy, third trimester: Secondary | ICD-10-CM | POA: Diagnosis not present

## 2015-05-15 DIAGNOSIS — O10913 Unspecified pre-existing hypertension complicating pregnancy, third trimester: Secondary | ICD-10-CM

## 2015-05-15 DIAGNOSIS — Z21 Asymptomatic human immunodeficiency virus [HIV] infection status: Secondary | ICD-10-CM

## 2015-05-15 DIAGNOSIS — O99323 Drug use complicating pregnancy, third trimester: Secondary | ICD-10-CM

## 2015-05-15 DIAGNOSIS — F418 Other specified anxiety disorders: Secondary | ICD-10-CM

## 2015-05-15 DIAGNOSIS — D649 Anemia, unspecified: Secondary | ICD-10-CM

## 2015-05-15 DIAGNOSIS — O0993 Supervision of high risk pregnancy, unspecified, third trimester: Secondary | ICD-10-CM

## 2015-05-15 DIAGNOSIS — F1290 Cannabis use, unspecified, uncomplicated: Secondary | ICD-10-CM

## 2015-05-15 DIAGNOSIS — O99013 Anemia complicating pregnancy, third trimester: Secondary | ICD-10-CM

## 2015-05-15 LAB — POCT URINALYSIS DIP (DEVICE)
Bilirubin Urine: NEGATIVE
GLUCOSE, UA: NEGATIVE mg/dL
HGB URINE DIPSTICK: NEGATIVE
Ketones, ur: NEGATIVE mg/dL
Leukocytes, UA: NEGATIVE
Nitrite: NEGATIVE
PH: 7 (ref 5.0–8.0)
PROTEIN: NEGATIVE mg/dL
SPECIFIC GRAVITY, URINE: 1.015 (ref 1.005–1.030)
UROBILINOGEN UA: 0.2 mg/dL (ref 0.0–1.0)

## 2015-05-15 NOTE — Patient Instructions (Signed)
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
## 2015-05-20 ENCOUNTER — Encounter: Payer: Medicaid Other | Admitting: Obstetrics & Gynecology

## 2015-05-24 ENCOUNTER — Other Ambulatory Visit: Payer: Medicaid Other

## 2015-05-28 ENCOUNTER — Encounter: Payer: Self-pay | Admitting: Family Medicine

## 2015-05-28 ENCOUNTER — Ambulatory Visit (INDEPENDENT_AMBULATORY_CARE_PROVIDER_SITE_OTHER): Payer: Medicaid Other | Admitting: Student

## 2015-05-28 VITALS — BP 135/91 | HR 84 | Wt 224.0 lb

## 2015-05-28 DIAGNOSIS — O98713 Human immunodeficiency virus [HIV] disease complicating pregnancy, third trimester: Secondary | ICD-10-CM | POA: Diagnosis not present

## 2015-05-28 DIAGNOSIS — Z21 Asymptomatic human immunodeficiency virus [HIV] infection status: Secondary | ICD-10-CM

## 2015-05-28 DIAGNOSIS — O10913 Unspecified pre-existing hypertension complicating pregnancy, third trimester: Secondary | ICD-10-CM | POA: Diagnosis not present

## 2015-05-28 DIAGNOSIS — I10 Essential (primary) hypertension: Secondary | ICD-10-CM

## 2015-05-28 DIAGNOSIS — O0993 Supervision of high risk pregnancy, unspecified, third trimester: Secondary | ICD-10-CM

## 2015-05-28 LAB — POCT URINALYSIS DIP (DEVICE)
BILIRUBIN URINE: NEGATIVE
Glucose, UA: NEGATIVE mg/dL
HGB URINE DIPSTICK: NEGATIVE
Ketones, ur: NEGATIVE mg/dL
Leukocytes, UA: NEGATIVE
NITRITE: NEGATIVE
PH: 6 (ref 5.0–8.0)
Protein, ur: NEGATIVE mg/dL
Specific Gravity, Urine: 1.01 (ref 1.005–1.030)
UROBILINOGEN UA: 0.2 mg/dL (ref 0.0–1.0)

## 2015-05-28 NOTE — Progress Notes (Signed)
Patient reports pelvic pressure/pain 

## 2015-05-28 NOTE — Progress Notes (Signed)
Subjective:  Catherine MccallumMarkita L Simmons is a 30 y.o. [phone] at 5561w4d being seen today for ongoing prenatal care.  She is currently monitored for the following issues for this high-risk pregnancy and has OBESITY; SMOKER; DEPRESSION; CARPAL TUNNEL SYNDROME; NUMMULAR ECZEMA; Human immunodeficiency virus (HIV) disease (HCC); Anxiety; Supervision of high-risk pregnancy; Hx of pelvic inflammatory disease; HIV disease affecting pregnancy, antepartum; Preexisting hypertension complicating pregnancy, antepartum; Obesity affecting pregnancy in third trimester, antepartum; Chronic hypertension; Marijuana use; and Maternal anemia in pregnancy, antepartum on her problem list.  Patient reports no complaints.  Contractions: Irritability. Vag. Bleeding: None.  Movement: Present. Denies leaking of fluid.   The following portions of the patient's history were reviewed and updated as appropriate: allergies, current medications, past family history, past medical history, past social history, past surgical history and problem list. Problem list updated.  Objective:   Filed Vitals:   05/28/15 0933  BP: 135/91  Pulse: 84  Weight: 224 lb (101.606 kg)    Fetal Status: Fetal Heart Rate (bpm): NST   Movement: Present                            Reactive NST  General:  Alert, oriented and cooperative. Patient is in no acute distress.  Skin: Skin is warm and dry. No rash noted.   Cardiovascular: Normal heart rate noted  Respiratory: Normal respiratory effort, no problems with respiration noted  Abdomen: Soft, gravid, appropriate for gestational age. Fundal height 34 cm    Pelvic: Vag. Bleeding: None     Cervical exam deferred        Extremities: Normal range of motion.  Edema: Trace  Mental Status: Normal mood and affect. Normal behavior. Normal judgment and thought content.   Urinalysis:      Assessment and Plan:  Pregnancy: G4P3003 at 6661w4d  1. Chronic hypertension -currently taking labetalol  2. HIV disease  affecting pregnancy, antepartum, third trimester  - Fetal nonstress test  3. Preexisting hypertension complicating pregnancy, antepartum, third trimester  - Fetal nonstress test -labetalol  4. Supervision of high-risk pregnancy, third trimester   Preterm labor symptoms and general obstetric precautions including but not limited to vaginal bleeding, contractions, leaking of fluid and fetal movement were reviewed in detail with the patient. Please refer to After Visit Summary for other counseling recommendations.  Return in about 3 days (around 05/31/2015) for NST/AFI.   Judeth HornErin Barbarajean Kinzler, NP

## 2015-05-28 NOTE — Patient Instructions (Signed)

## 2015-05-31 ENCOUNTER — Encounter (HOSPITAL_COMMUNITY): Payer: Self-pay | Admitting: *Deleted

## 2015-05-31 ENCOUNTER — Ambulatory Visit (INDEPENDENT_AMBULATORY_CARE_PROVIDER_SITE_OTHER): Payer: Medicaid Other | Admitting: *Deleted

## 2015-05-31 ENCOUNTER — Inpatient Hospital Stay (HOSPITAL_COMMUNITY): Payer: Medicaid Other

## 2015-05-31 ENCOUNTER — Inpatient Hospital Stay (HOSPITAL_COMMUNITY)
Admission: AD | Admit: 2015-05-31 | Discharge: 2015-06-02 | DRG: 781 | Disposition: A | Discharge status: Home / Self Care | Payer: Medicaid Other | Source: Ambulatory Visit | Attending: Obstetrics & Gynecology | Admitting: Obstetrics & Gynecology

## 2015-05-31 VITALS — BP 132/77 | HR 75

## 2015-05-31 DIAGNOSIS — Z21 Asymptomatic human immunodeficiency virus [HIV] infection status: Secondary | ICD-10-CM | POA: Diagnosis present

## 2015-05-31 DIAGNOSIS — R45851 Suicidal ideations: Secondary | ICD-10-CM

## 2015-05-31 DIAGNOSIS — Z3A33 33 weeks gestation of pregnancy: Secondary | ICD-10-CM

## 2015-05-31 DIAGNOSIS — IMO0002 Reserved for concepts with insufficient information to code with codable children: Secondary | ICD-10-CM

## 2015-05-31 DIAGNOSIS — O9A313 Physical abuse complicating pregnancy, third trimester: Principal | ICD-10-CM | POA: Diagnosis present

## 2015-05-31 DIAGNOSIS — Z87891 Personal history of nicotine dependence: Secondary | ICD-10-CM

## 2015-05-31 DIAGNOSIS — Z36 Encounter for antenatal screening of mother: Secondary | ICD-10-CM

## 2015-05-31 DIAGNOSIS — A630 Anogenital (venereal) warts: Secondary | ICD-10-CM | POA: Diagnosis present

## 2015-05-31 DIAGNOSIS — O98713 Human immunodeficiency virus [HIV] disease complicating pregnancy, third trimester: Secondary | ICD-10-CM | POA: Diagnosis present

## 2015-05-31 DIAGNOSIS — O4703 False labor before 37 completed weeks of gestation, third trimester: Secondary | ICD-10-CM | POA: Diagnosis not present

## 2015-05-31 DIAGNOSIS — O10013 Pre-existing essential hypertension complicating pregnancy, third trimester: Secondary | ICD-10-CM | POA: Diagnosis present

## 2015-05-31 DIAGNOSIS — O9A213 Injury, poisoning and certain other consequences of external causes complicating pregnancy, third trimester: Secondary | ICD-10-CM

## 2015-05-31 DIAGNOSIS — T7491XA Unspecified adult maltreatment, confirmed, initial encounter: Secondary | ICD-10-CM | POA: Diagnosis present

## 2015-05-31 DIAGNOSIS — O99013 Anemia complicating pregnancy, third trimester: Secondary | ICD-10-CM | POA: Diagnosis present

## 2015-05-31 DIAGNOSIS — O10913 Unspecified pre-existing hypertension complicating pregnancy, third trimester: Secondary | ICD-10-CM

## 2015-05-31 DIAGNOSIS — D649 Anemia, unspecified: Secondary | ICD-10-CM | POA: Diagnosis present

## 2015-05-31 DIAGNOSIS — Z3A32 32 weeks gestation of pregnancy: Secondary | ICD-10-CM | POA: Diagnosis not present

## 2015-05-31 DIAGNOSIS — Z833 Family history of diabetes mellitus: Secondary | ICD-10-CM

## 2015-05-31 DIAGNOSIS — O99343 Other mental disorders complicating pregnancy, third trimester: Secondary | ICD-10-CM | POA: Diagnosis present

## 2015-05-31 DIAGNOSIS — O1092 Unspecified pre-existing hypertension complicating childbirth: Secondary | ICD-10-CM

## 2015-05-31 DIAGNOSIS — D573 Sickle-cell trait: Secondary | ICD-10-CM | POA: Diagnosis present

## 2015-05-31 DIAGNOSIS — F329 Major depressive disorder, single episode, unspecified: Secondary | ICD-10-CM | POA: Diagnosis present

## 2015-05-31 LAB — COMPREHENSIVE METABOLIC PANEL
ALK PHOS: 88 U/L (ref 38–126)
ALT: 9 U/L — AB (ref 14–54)
AST: 10 U/L — AB (ref 15–41)
Albumin: 2.7 g/dL — ABNORMAL LOW (ref 3.5–5.0)
Anion gap: 5 (ref 5–15)
BILIRUBIN TOTAL: 0.4 mg/dL (ref 0.3–1.2)
BUN: 5 mg/dL — AB (ref 6–20)
CALCIUM: 8.4 mg/dL — AB (ref 8.9–10.3)
CO2: 23 mmol/L (ref 22–32)
CREATININE: 0.49 mg/dL (ref 0.44–1.00)
Chloride: 108 mmol/L (ref 101–111)
Glucose, Bld: 87 mg/dL (ref 65–99)
Potassium: 3.7 mmol/L (ref 3.5–5.1)
Sodium: 136 mmol/L (ref 135–145)
TOTAL PROTEIN: 6.2 g/dL — AB (ref 6.5–8.1)

## 2015-05-31 LAB — PROTEIN / CREATININE RATIO, URINE
CREATININE, URINE: 134 mg/dL
Protein Creatinine Ratio: 0.1 mg/mg{Cre} (ref 0.00–0.15)
TOTAL PROTEIN, URINE: 13 mg/dL

## 2015-05-31 LAB — CBC
HEMATOCRIT: 26.9 % — AB (ref 36.0–46.0)
Hemoglobin: 9.1 g/dL — ABNORMAL LOW (ref 12.0–15.0)
MCH: 25.5 pg — AB (ref 26.0–34.0)
MCHC: 33.8 g/dL (ref 30.0–36.0)
MCV: 75.4 fL — AB (ref 78.0–100.0)
PLATELETS: 238 10*3/uL (ref 150–400)
RBC: 3.57 MIL/uL — AB (ref 3.87–5.11)
RDW: 13.2 % (ref 11.5–15.5)
WBC: 6.9 10*3/uL (ref 4.0–10.5)

## 2015-05-31 LAB — TYPE AND SCREEN
ABO/RH(D): A POS
Antibody Screen: NEGATIVE
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
## 2015-05-31 MED ORDER — EMTRICITABINE-TENOFOVIR AF 200-25 MG PO TABS
1.0000 | ORAL_TABLET | Freq: Every day | ORAL | Status: DC
  Administered 2015-05-31 – 2015-06-01 (×2): 1 via ORAL
  Filled 2015-05-31 (×2): qty 1

## 2015-05-31 MED ORDER — PRENATAL MULTIVITAMIN CH
1.0000 | ORAL_TABLET | Freq: Every day | ORAL | Status: DC
  Administered 2015-06-01: 1 via ORAL
  Filled 2015-05-31 (×2): qty 1

## 2015-05-31 MED ORDER — DARUNAVIR-COBICISTAT 800-150 MG PO TABS
1.0000 | ORAL_TABLET | Freq: Every day | ORAL | Status: DC
  Administered 2015-05-31 – 2015-06-01 (×2): 1 via ORAL
  Filled 2015-05-31 (×2): qty 1

## 2015-05-31 MED ORDER — ZOLPIDEM TARTRATE 5 MG PO TABS
5.0000 mg | ORAL_TABLET | Freq: Every evening | ORAL | Status: DC | PRN
  Administered 2015-06-01: 5 mg via ORAL
  Filled 2015-05-31: qty 1

## 2015-05-31 MED ORDER — DOCUSATE SODIUM 100 MG PO CAPS
100.0000 mg | ORAL_CAPSULE | Freq: Every day | ORAL | Status: DC
  Administered 2015-06-01: 100 mg via ORAL
  Filled 2015-05-31: qty 1

## 2015-05-31 MED ORDER — CALCIUM CARBONATE ANTACID 500 MG PO CHEW: 2.0000 | CHEWABLE_TABLET | ORAL | Status: DC | PRN

## 2015-05-31 MED ORDER — ONDANSETRON HCL 4 MG PO TABS
8.0000 mg | ORAL_TABLET | Freq: Every day | ORAL | Status: DC
  Administered 2015-06-01 (×2): 8 mg via ORAL
  Filled 2015-05-31 (×2): qty 2

## 2015-05-31 MED ORDER — ACETAMINOPHEN 325 MG PO TABS
650.0000 mg | ORAL_TABLET | ORAL | Status: DC | PRN
  Administered 2015-05-31 – 2015-06-02 (×5): 650 mg via ORAL
  Filled 2015-05-31 (×5): qty 2

## 2015-05-31 NOTE — Progress Notes (Signed)
NST performed today was reviewed and was found to be reactive.  AFI was also normal.  Continue recommended antenatal testing and prenatal care. 

## 2015-05-31 NOTE — MAU Note (Addendum)
Security has talked to house coverage and was told that if pt. Wants family to visit she would have to give family and friends her room number and inform security of the names that she will allow to come visit. Pt. Is filling out paper work. Pt. Has decided to become XXX.
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
## 2015-05-31 NOTE — MAU Note (Signed)
Attempted to connect to tele phsyc unable to connect at this time.  IT called Will send tech out to look at machine.
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
## 2015-05-31 NOTE — BH Assessment (Signed)
Unable to complete assessment at this time due to technical difficulties with tele-assessment machine. Informed pt's nurse that the machine is not connecting and IT will be contacted. TTS assessment will be completed at a later time.

## 2015-05-31 NOTE — MAU Note (Signed)
SANE nurse at bedside.

## 2015-05-31 NOTE — Progress Notes (Signed)
US for growth on 4/28, BPP added.

## 2015-05-31 NOTE — SANE Note (Signed)
Sp[facility] with Trinna PostAlex[facility].  Pt has not been cleared yet.  Waiting for u/s.  Alex agrees to call back when pt has been cleared medically.

## 2015-06-01 DIAGNOSIS — O4703 False labor before 37 completed weeks of gestation, third trimester: Secondary | ICD-10-CM | POA: Diagnosis not present

## 2015-06-01 DIAGNOSIS — S098XXA Other specified injuries of head, initial encounter: Secondary | ICD-10-CM

## 2015-06-01 DIAGNOSIS — F329 Major depressive disorder, single episode, unspecified: Secondary | ICD-10-CM | POA: Diagnosis present

## 2015-06-01 DIAGNOSIS — D573 Sickle-cell trait: Secondary | ICD-10-CM

## 2015-06-01 DIAGNOSIS — O9A313 Physical abuse complicating pregnancy, third trimester: Principal | ICD-10-CM

## 2015-06-01 DIAGNOSIS — Z21 Asymptomatic human immunodeficiency virus [HIV] infection status: Secondary | ICD-10-CM

## 2015-06-01 DIAGNOSIS — Z3A33 33 weeks gestation of pregnancy: Secondary | ICD-10-CM

## 2015-06-01 DIAGNOSIS — D649 Anemia, unspecified: Secondary | ICD-10-CM | POA: Diagnosis present

## 2015-06-01 DIAGNOSIS — R45851 Suicidal ideations: Secondary | ICD-10-CM | POA: Diagnosis present

## 2015-06-01 DIAGNOSIS — O26893 Other specified pregnancy related conditions, third trimester: Secondary | ICD-10-CM | POA: Diagnosis present

## 2015-06-01 DIAGNOSIS — Z87891 Personal history of nicotine dependence: Secondary | ICD-10-CM | POA: Diagnosis not present

## 2015-06-01 DIAGNOSIS — O99013 Anemia complicating pregnancy, third trimester: Secondary | ICD-10-CM | POA: Diagnosis present

## 2015-06-01 DIAGNOSIS — O98713 Human immunodeficiency virus [HIV] disease complicating pregnancy, third trimester: Secondary | ICD-10-CM | POA: Diagnosis present

## 2015-06-01 DIAGNOSIS — O99343 Other mental disorders complicating pregnancy, third trimester: Secondary | ICD-10-CM

## 2015-06-01 DIAGNOSIS — A630 Anogenital (venereal) warts: Secondary | ICD-10-CM | POA: Diagnosis present

## 2015-06-01 DIAGNOSIS — O10013 Pre-existing essential hypertension complicating pregnancy, third trimester: Secondary | ICD-10-CM | POA: Diagnosis present

## 2015-06-01 DIAGNOSIS — S3991XA Unspecified injury of abdomen, initial encounter: Secondary | ICD-10-CM | POA: Diagnosis not present

## 2015-06-01 DIAGNOSIS — O9A213 Injury, poisoning and certain other consequences of external causes complicating pregnancy, third trimester: Secondary | ICD-10-CM | POA: Diagnosis not present

## 2015-06-01 DIAGNOSIS — Z833 Family history of diabetes mellitus: Secondary | ICD-10-CM | POA: Diagnosis not present

## 2015-06-01 MED ORDER — LABETALOL HCL 100 MG PO TABS
200.0000 mg | ORAL_TABLET | Freq: Every day | ORAL | Status: DC
  Administered 2015-06-01: 200 mg via ORAL
  Filled 2015-06-01: qty 2
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
## 2015-06-01 NOTE — Progress Notes (Signed)
[facility] who states assessment has been completed and patient does not meet criteria for inpatient treatment.  Patient has been given resources for outpatient mental health follow up.  CSW notified patient's bedside RN.  CSW will meet with patient to offer support, ensure that she feels safe going home, and discuss plans for adoption if she wishes.  (TTS note states patient is contemplating adoption to her sister.)
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
## 2015-06-01 NOTE — Progress Notes (Signed)
Behavorial Health interview

## 2015-06-02 DIAGNOSIS — O9A213 Injury, poisoning and certain other consequences of external causes complicating pregnancy, third trimester: Secondary | ICD-10-CM

## 2015-06-02 DIAGNOSIS — S3991XA Unspecified injury of abdomen, initial encounter: Secondary | ICD-10-CM

## 2015-06-02 DIAGNOSIS — IMO0002 Reserved for concepts with insufficient information to code with codable children: Secondary | ICD-10-CM

## 2015-06-02 DIAGNOSIS — Y939 Activity, unspecified: Secondary | ICD-10-CM

## 2015-06-02 MED ORDER — PANTOPRAZOLE SODIUM 40 MG PO TBEC: 40.0000 mg | DELAYED_RELEASE_TABLET | Freq: Every day | ORAL | Status: DC
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
## 2015-06-02 NOTE — Discharge Instructions (Signed)
Braxton Hicks Contractions °Contractions of the uterus can occur throughout pregnancy. Contractions are not always a sign that you are in labor.  °WHAT ARE BRAXTON HICKS CONTRACTIONS?  °Contractions that occur before labor are called Braxton Hicks contractions, or false labor. Toward the end of pregnancy (32-34 weeks), these contractions can develop more often and may become more forceful. This is not true labor because these contractions do not result in opening (dilatation) and thinning of the cervix. They are sometimes difficult to tell apart from true labor because these contractions can be forceful and people have different pain tolerances. You should not feel embarrassed if you go to the hospital with false labor. Sometimes, the only way to tell if you are in true labor is for your health care provider to look for changes in the cervix. °If there are no prenatal problems or other health problems associated with the pregnancy, it is completely safe to be sent home with false labor and await the onset of true labor. °HOW CAN YOU TELL THE DIFFERENCE BETWEEN TRUE AND FALSE LABOR? °False Labor °· The contractions of false labor are usually shorter and not as hard as those of true labor.   °· The contractions are usually irregular.   °· The contractions are often felt in the front of the lower abdomen and in the groin.   °· The contractions may go away when you walk around or change positions while lying down.   °· The contractions get weaker and are shorter lasting as time goes on.   °· The contractions do not usually become progressively stronger, regular, and closer together as with true labor.   °True Labor °1. Contractions in true labor last 30-70 seconds, become very regular, usually become more intense, and increase in frequency.   °2. The contractions do not go away with walking.   °3. The discomfort is usually felt in the top of the uterus and spreads to the lower abdomen and low back.   °4. True labor can  be determined by your health care provider with an exam. This will show that the cervix is dilating and getting thinner.   °WHAT TO REMEMBER °· Keep up with your usual exercises and follow other instructions given by your health care provider.   °· Take medicines as directed by your health care provider.   °· Keep your regular prenatal appointments.   °· Eat and drink lightly if you think you are going into labor.   °· If Braxton Hicks contractions are making you uncomfortable:   °· Change your position from lying down or resting to walking, or from walking to resting.   °· Sit and rest in a tub of warm water.   °· Drink 2-3 glasses of water. Dehydration may cause these contractions.   °· Do slow and deep breathing several times an hour.   °WHEN SHOULD I SEEK IMMEDIATE MEDICAL CARE? °Seek immediate medical care if: °· Your contractions become stronger, more regular, and closer together.   °· You have fluid leaking or gushing from your vagina.   °· You have a fever.   °· You pass blood-tinged mucus.   °· You have vaginal bleeding.   °· You have continuous abdominal pain.   °· You have low back pain that you never had before.   °· You feel your baby's head pushing down and causing pelvic pressure.   °· Your baby is not moving as much as it used to.   °  °This information is not intended to replace advice given to you by your health care provider. Make sure you discuss any questions you have with your health care   provider. °  °Document Released: 01/26/2005 Document Revised: 01/31/2013 Document Reviewed: 11/07/2012 °Elsevier Interactive Patient Education ©2016 [employer] ° °Fetal Movement Counts °Patient Name: __________________________________________________ Patient Due Date: ____________________ °Performing a fetal movement count is highly recommended in high-risk pregnancies, but it is good for every pregnant woman to do. Your health care provider may ask you to start counting fetal movements at 28 weeks of the  pregnancy. Fetal movements often increase: °· After eating a full meal. °· After physical activity. °· After eating or drinking something sweet or cold. °· At rest. °Pay attention to when you feel the baby is most active. This will help you notice a pattern of your baby's sleep and wake cycles and what factors contribute to an increase in fetal movement. It is important to perform a fetal movement count at the same time each day when your baby is normally most active.  °HOW TO COUNT FETAL MOVEMENTS °5. Find a quiet and comfortable area to sit or lie down on your left side. Lying on your left side provides the best blood and oxygen circulation to your baby. °6. Write down the day and time on a sheet of paper or in a journal. °7. Start counting kicks, flutters, swishes, rolls, or jabs in a 2-hour period. You should feel at least 10 movements within 2 hours. °8. If you do not feel 10 movements in 2 hours, wait 2-3 hours and count again. Look for a change in the pattern or not enough counts in 2 hours. °SEEK MEDICAL CARE IF: °· You feel less than 10 counts in 2 hours, tried twice. °· There is no movement in over an hour. °· The pattern is changing or taking longer each day to reach 10 counts in 2 hours. °· You feel the baby is not moving as he or she usually does. °Date: ____________ Movements: ____________ Start time: ____________ Finish time: ____________  °Date: ____________ Movements: ____________ Start time: ____________ Finish time: ____________ °Date: ____________ Movements: ____________ Start time: ____________ Finish time: ____________ °Date: ____________ Movements: ____________ Start time: ____________ Finish time: ____________ °Date: ____________ Movements: ____________ Start time: ____________ Finish time: ____________ °Date: ____________ Movements: ____________ Start time: ____________ Finish time: ____________ °Date: ____________ Movements: ____________ Start time: ____________ Finish time:  ____________ °Date: ____________ Movements: ____________ Start time: ____________ Finish time: ____________  °Date: ____________ Movements: ____________ Start time: ____________ Finish time: ____________ °Date: ____________ Movements: ____________ Start time: ____________ Finish time: ____________ °Date: ____________ Movements: ____________ Start time: ____________ Finish time: ____________ °Date: ____________ Movements: ____________ Start time: ____________ Finish time: ____________ °Date: ____________ Movements: ____________ Start time: ____________ Finish time: ____________ °Date: ____________ Movements: ____________ Start time: ____________ Finish time: ____________ °Date: ____________ Movements: ____________ Start time: ____________ Finish time: ____________  °Date: ____________ Movements: ____________ Start time: ____________ Finish time: ____________ °Date: ____________ Movements: ____________ Start time: ____________ Finish time: ____________ °Date: ____________ Movements: ____________ Start time: ____________ Finish time: ____________ °Date: ____________ Movements: ____________ Start time: ____________ Finish time: ____________ °Date: ____________ Movements: ____________ Start time: ____________ Finish time: ____________ °Date: ____________ Movements: ____________ Start time: ____________ Finish time: ____________ °Date: ____________ Movements: ____________ Start time: ____________ Finish time: ____________  °Date: ____________ Movements: ____________ Start time: ____________ Finish time: ____________ °Date: ____________ Movements: ____________ Start time: ____________ Finish time: ____________ °Date: ____________ Movements: ____________ Start time: ____________ Finish time: ____________ °Date: ____________ Movements: ____________ Start time: ____________ Finish time: ____________ °Date: ____________ Movements: ____________ Start time: ____________ Finish time: ____________ °Date: ____________ Movements:  ____________ Start time: ____________ Finish   time: ____________ °Date: ____________ Movements: ____________ Start time: ____________ Finish time: ____________  °Date: ____________ Movements: ____________ Start time: ____________ Finish time: ____________ °Date: ____________ Movements: ____________ Start time: ____________ Finish time: ____________ °Date: ____________ Movements: ____________ Start time: ____________ Finish time: ____________ °Date: ____________ Movements: ____________ Start time: ____________ Finish time: ____________ °Date: ____________ Movements: ____________ Start time: ____________ Finish time: ____________ °Date: ____________ Movements: ____________ Start time: ____________ Finish time: ____________ °Date: ____________ Movements: ____________ Start time: ____________ Finish time: ____________  °Date: ____________ Movements: ____________ Start time: ____________ Finish time: ____________ °Date: ____________ Movements: ____________ Start time: ____________ Finish time: ____________ °Date: ____________ Movements: ____________ Start time: ____________ Finish time: ____________ °Date: ____________ Movements: ____________ Start time: ____________ Finish time: ____________ °Date: ____________ Movements: ____________ Start time: ____________ Finish time: ____________ °Date: ____________ Movements: ____________ Start time: ____________ Finish time: ____________ °Date: ____________ Movements: ____________ Start time: ____________ Finish time: ____________  °Date: ____________ Movements: ____________ Start time: ____________ Finish time: ____________ °Date: ____________ Movements: ____________ Start time: ____________ Finish time: ____________ °Date: ____________ Movements: ____________ Start time: ____________ Finish time: ____________ °Date: ____________ Movements: ____________ Start time: ____________ Finish time: ____________ °Date: ____________ Movements: ____________ Start time: ____________ Finish  time: ____________ °Date: ____________ Movements: ____________ Start time: ____________ Finish time: ____________ °Date: ____________ Movements: ____________ Start time: ____________ Finish time: ____________  °Date: ____________ Movements: ____________ Start time: ____________ Finish time: ____________ °Date: ____________ Movements: ____________ Start time: ____________ Finish time: ____________ °Date: ____________ Movements: ____________ Start time: ____________ Finish time: ____________ °Date: ____________ Movements: ____________ Start time: ____________ Finish time: ____________ °Date: ____________ Movements: ____________ Start time: ____________ Finish time: ____________ °Date: ____________ Movements: ____________ Start time: ____________ Finish time: ____________ °  °This information is not intended to replace advice given to you by your health care provider. Make sure you discuss any questions you have with your health care provider. °  °Document Released: 02/25/2006 Document Revised: 02/16/2014 Document Reviewed: 11/23/2011 °Elsevier Interactive Patient Education ©2016 [employer] ° °

## 2015-06-05 ENCOUNTER — Ambulatory Visit (INDEPENDENT_AMBULATORY_CARE_PROVIDER_SITE_OTHER): Payer: Medicaid Other | Admitting: Certified Nurse Midwife

## 2015-06-05 VITALS — BP 121/80 | HR 81 | Wt 224.4 lb

## 2015-06-05 DIAGNOSIS — O10913 Unspecified pre-existing hypertension complicating pregnancy, third trimester: Secondary | ICD-10-CM | POA: Diagnosis not present

## 2015-06-05 DIAGNOSIS — IMO0002 Reserved for concepts with insufficient information to code with codable children: Secondary | ICD-10-CM

## 2015-06-05 DIAGNOSIS — O98713 Human immunodeficiency virus [HIV] disease complicating pregnancy, third trimester: Secondary | ICD-10-CM

## 2015-06-05 DIAGNOSIS — O0993 Supervision of high risk pregnancy, unspecified, third trimester: Secondary | ICD-10-CM

## 2015-06-05 LAB — POCT URINALYSIS DIP (DEVICE)
Bilirubin Urine: NEGATIVE
GLUCOSE, UA: NEGATIVE mg/dL
HGB URINE DIPSTICK: NEGATIVE
Ketones, ur: NEGATIVE mg/dL
Leukocytes, UA: NEGATIVE
NITRITE: NEGATIVE
PROTEIN: NEGATIVE mg/dL
SPECIFIC GRAVITY, URINE: 1.02 (ref 1.005–1.030)
UROBILINOGEN UA: 0.2 mg/dL (ref 0.0–1.0)
pH: 6 (ref 5.0–8.0)

## 2015-06-05 NOTE — Patient Instructions (Signed)
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
## 2015-06-07 ENCOUNTER — Other Ambulatory Visit (HOSPITAL_COMMUNITY): Payer: Self-pay | Admitting: Obstetrics and Gynecology

## 2015-06-07 ENCOUNTER — Ambulatory Visit (HOSPITAL_COMMUNITY)
Admission: RE | Admit: 2015-06-07 | Discharge: 2015-06-07 | Disposition: A | Discharge status: Home / Self Care | Payer: Medicaid Other | Source: Ambulatory Visit | Attending: Family Medicine | Admitting: Family Medicine

## 2015-06-07 ENCOUNTER — Encounter (HOSPITAL_COMMUNITY): Payer: Self-pay

## 2015-06-07 ENCOUNTER — Other Ambulatory Visit: Payer: Self-pay | Admitting: Obstetrics & Gynecology

## 2015-06-07 VITALS — BP 146/77 | HR 82 | Wt 225.5 lb

## 2015-06-07 DIAGNOSIS — Z8759 Personal history of other complications of pregnancy, childbirth and the puerperium: Secondary | ICD-10-CM

## 2015-06-07 DIAGNOSIS — O163 Unspecified maternal hypertension, third trimester: Secondary | ICD-10-CM

## 2015-06-07 DIAGNOSIS — O98713 Human immunodeficiency virus [HIV] disease complicating pregnancy, third trimester: Secondary | ICD-10-CM

## 2015-06-07 DIAGNOSIS — O10013 Pre-existing essential hypertension complicating pregnancy, third trimester: Secondary | ICD-10-CM | POA: Diagnosis not present

## 2015-06-07 DIAGNOSIS — O99213 Obesity complicating pregnancy, third trimester: Secondary | ICD-10-CM

## 2015-06-07 DIAGNOSIS — O10919 Unspecified pre-existing hypertension complicating pregnancy, unspecified trimester: Secondary | ICD-10-CM

## 2015-06-07 DIAGNOSIS — Z3A34 34 weeks gestation of pregnancy: Secondary | ICD-10-CM

## 2015-06-07 DIAGNOSIS — T7491XA Unspecified adult maltreatment, confirmed, initial encounter: Secondary | ICD-10-CM

## 2015-06-07 DIAGNOSIS — O09293 Supervision of pregnancy with other poor reproductive or obstetric history, third trimester: Secondary | ICD-10-CM | POA: Diagnosis not present

## 2015-06-07 DIAGNOSIS — F1290 Cannabis use, unspecified, uncomplicated: Secondary | ICD-10-CM

## 2015-06-07 DIAGNOSIS — O10913 Unspecified pre-existing hypertension complicating pregnancy, third trimester: Secondary | ICD-10-CM

## 2015-06-07 IMAGING — US US MFM OB FOLLOW-UP
2 series · 13 of 28 positions shown · non-contrast
Comparison: none

[Series 1: us mfm ob follow-up · 57 acquisitions, 11 frames shown (1 of 2)]
[im 3/57]
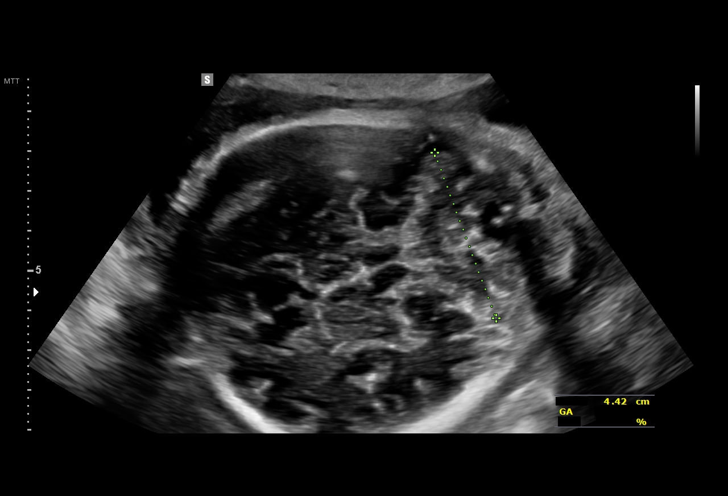
[im 8/57]
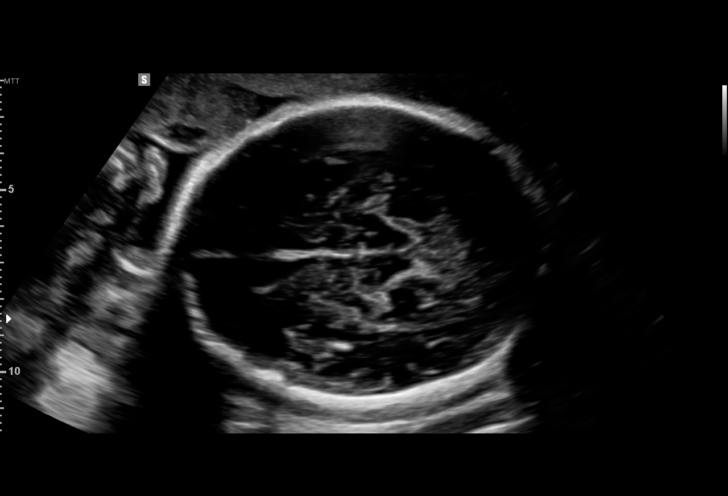
[im 13/57]
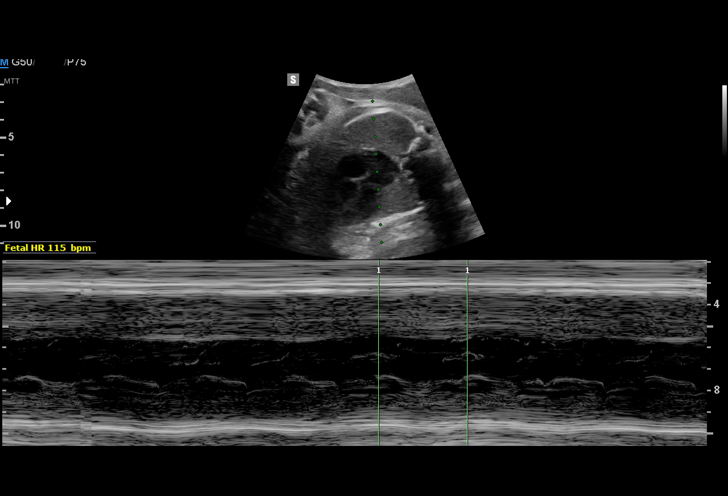
[im 18/57]
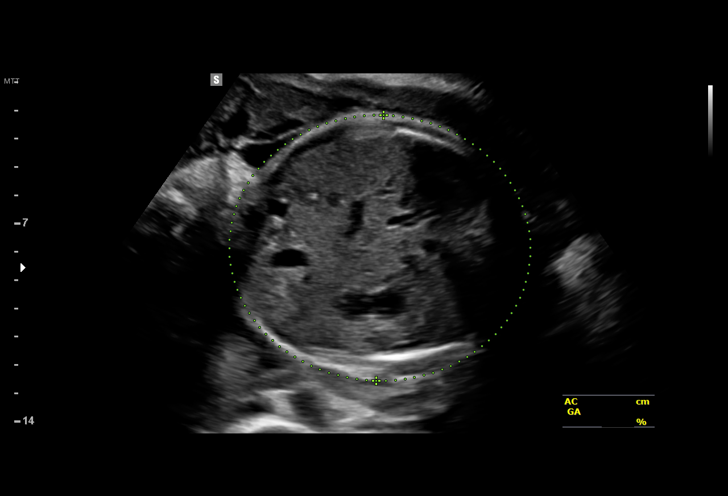
[im 23/57]
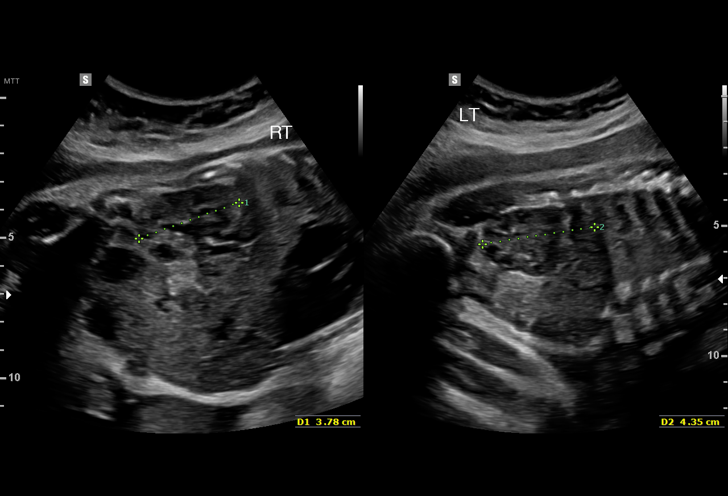
[im 29/57]
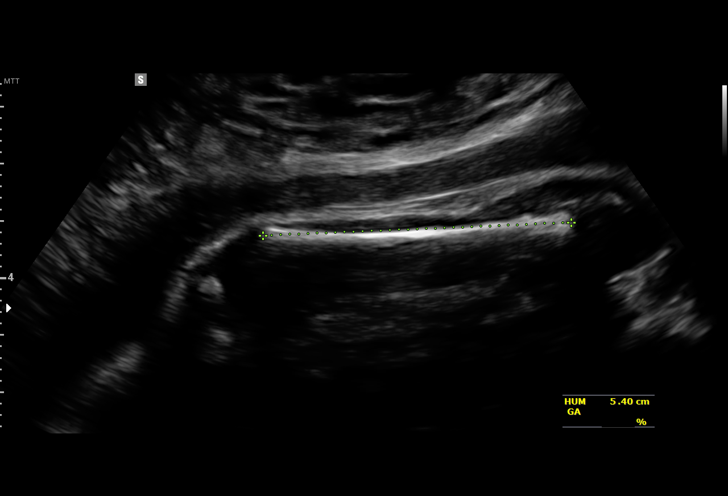
[im 36/57]
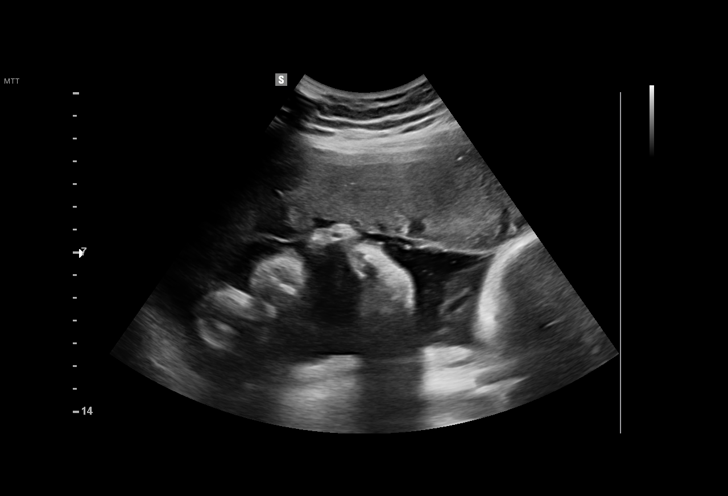
[im 41/57]
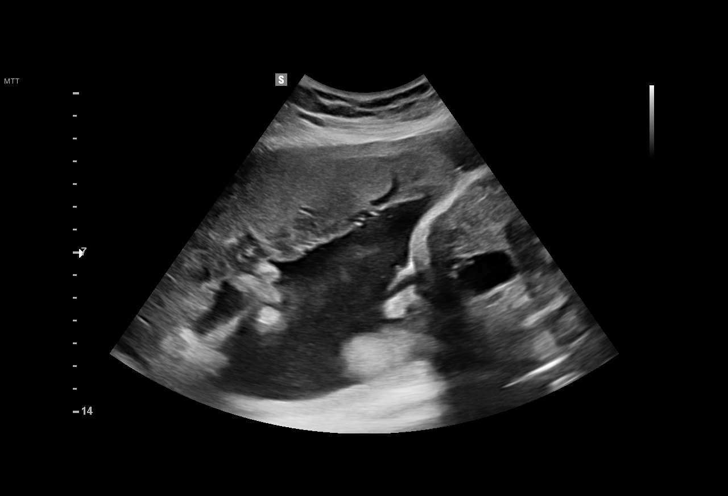
[im 46/57]
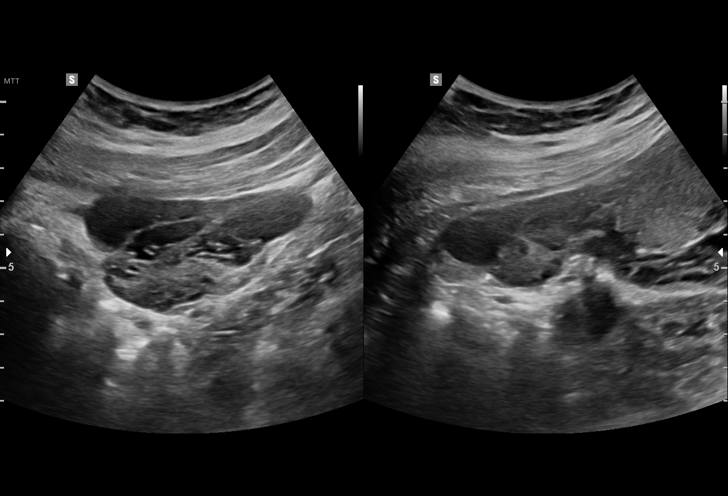
[im 51/57]
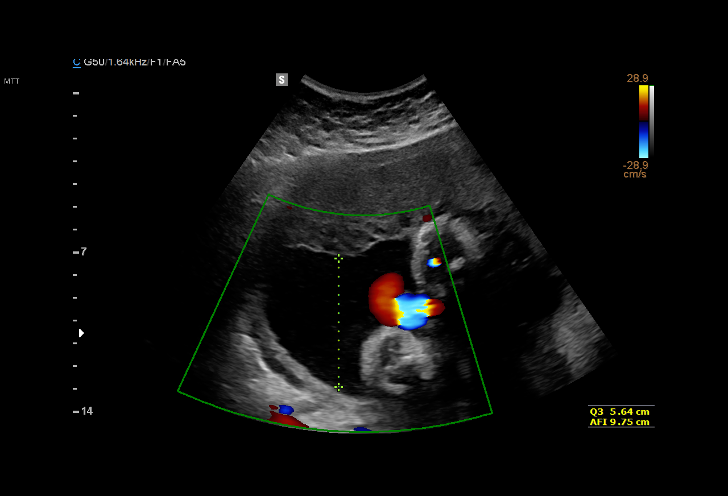
[im 57/57]
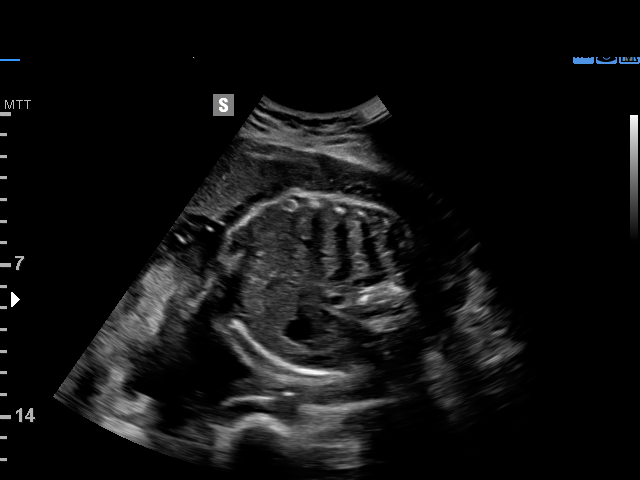

[Series 3: us mfm ob follow-up · 2 of 12 slices shown (2 of 2)]
[im 3/12]
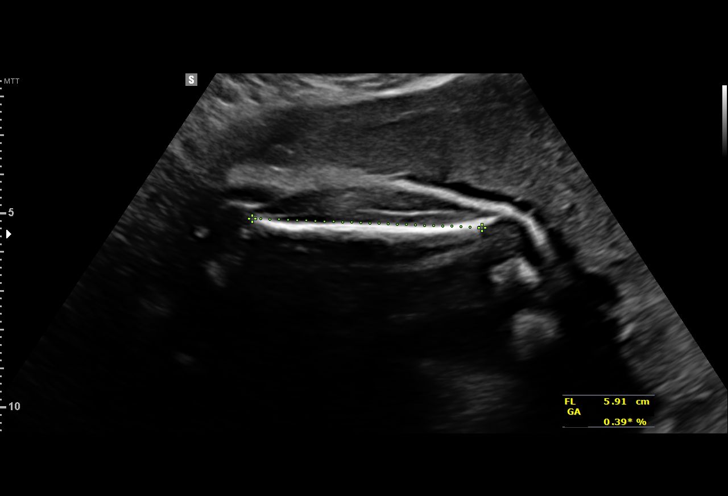
[im 9/12]
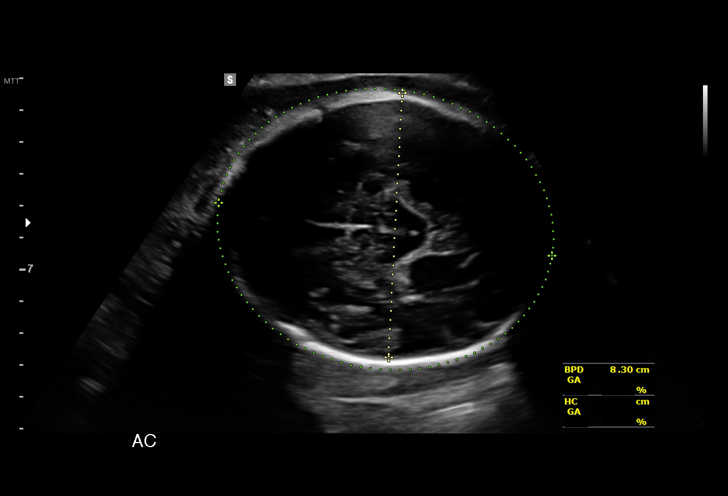

[13 of 28 positions shown; findings below may reference images not displayed]

Hospital Clinic-
Faculty Physician
OB/Gyn Clinic

2  [address]           [PHONE_NUMBER]      [PHONE_NUMBER]     [PHONE_NUMBER]
Indications

34 weeks gestation of pregnancy
HIV affecting pregnancy, third trimester       [GZ]
Hypertension - Chronic/Pre-existing (on        [GZ]
labetalol)
Poor obstetric history: Previous               [GZ]
preeclampsia / eclampsia/gestational HTN
Obesity complicating pregnancy, third          [GZ]
trimester
OB History

Blood Type:            Height:  5'2"   Weight (lb):  224      BMI:
Gravidity:    4         Term:   3        Prem:   0        SAB:   0
TOP:          0       Ectopic:  0        Living: 3
Fetal Evaluation

Num Of Fetuses:     1
Fetal Heart         115
Rate(bpm):
Cardiac Activity:   Observed
Presentation:       Cephalic
Placenta:           Anterior, above cervical os
P. Cord Insertion:  Previously Visualized

Amniotic Fluid
AFI FV:      Subjectively within normal limits

AFI Sum(cm)     %Tile       Largest Pocket(cm)
12.24           36

RUQ(cm)       RLQ(cm)       LUQ(cm)        LLQ(cm)
0
Biophysical Evaluation

Amniotic F.V:   Within normal limits       F. Tone:        Observed
F. Movement:    Observed                   Score:          [DATE]
F. Breathing:   Observed
Biometry

BPD:      81.6  mm     G. Age:  32w 6d         16  %    CI:        74.84   %   70 - 86
FL/HC:      19.3   %   19.4 -
HC:      299.3  mm     G. Age:  33w 1d          6  %    HC/AC:      0.95       0.96 -
AC:      314.6  mm     G. Age:  35w 3d         87  %    FL/BPD:     70.7   %   71 - 87
FL:       57.7  mm     G. Age:  30w 1d        < 3  %    FL/AC:      18.3   %   20 - 24
HUM:      53.1  mm     G. Age:  31w 0d        < 5  %
CER:      44.2  mm     G. Age:  38w 1d       > 95  %

Est. FW:    [GZ]  gm    4 lb 14 oz      49  %
Gestational Age

LMP:           34w 0d       Date:   [DATE]                 EDD:   [DATE]
U/S Today:     32w 6d                                        EDD:   [DATE]
Best:          34w 0d    Det. By:   LMP  ([DATE])          EDD:   [DATE]
Anatomy

Cranium:               Appears normal         Aortic Arch:            Previously seen
Cavum:                 Previously seen        Ductal Arch:            Previously seen
Ventricles:            Appears normal         Diaphragm:              Appears normal
Choroid Plexus:        Previously seen        Stomach:                Appears normal, left
sided
Cerebellum:            Appears normal         Abdomen:                Previously seen
Posterior Fossa:       Previously seen        Abdominal Wall:         Previously seen
Nuchal Fold:           Previously seen        Cord Vessels:           Previously seen
Face:                  Orbits and profile     Kidneys:                Appear normal
previously seen
Lips:                  Previously seen        Bladder:                Appears normal
Thoracic:              Previously seen        Spine:                  Previously seen
Heart:                 Appears normal         Upper Extremities:      Previously seen
(4CH, axis, and
situs)
RVOT:                  Previously seen        Lower Extremities:      Previously seen
LVOT:                  Previously seen

Other:  Male gender previously seen. Heels and 5th digit previously
visualized. Nasal bone previously visualized.  Technically difficult due
to fetal position.
Cervix Uterus Adnexa
Cervix
Not visualized (advanced GA >[GZ])

Uterus
No abnormality visualized.

Left Ovary
Size(cm)     4.26  x    2.52   x  1.85      Vol(ml):
Within normal limits. No adnexal mass visualized.

Right Ovary
Size(cm)     3.38  x    2.01   x  1.64      Vol(ml):
Within normal limits. No adnexal mass visualized.

Cul De Sac:   No free fluid seen.

Adnexa:       No abnormality visualized.
Impression

Single IUP at 34w 0d
HIV positve, CHTN
The estimated fetal weight today is at the 49th %tile.
BPP [DATE]
Anterior placenta without previa
Normal amniotic fluid volume
Recommendations

Continue antenatal testing (2x weekly NSTs with weekly AFIs)
Delivery by 39 weeks (CHTN)

## 2015-06-11 ENCOUNTER — Ambulatory Visit (INDEPENDENT_AMBULATORY_CARE_PROVIDER_SITE_OTHER): Payer: Medicaid Other | Admitting: Internal Medicine

## 2015-06-11 ENCOUNTER — Encounter: Payer: Self-pay | Admitting: Internal Medicine

## 2015-06-11 VITALS — BP 138/93 | HR 76 | Temp 97.8°F | Ht 62.0 in | Wt 227.0 lb

## 2015-06-11 DIAGNOSIS — F329 Major depressive disorder, single episode, unspecified: Secondary | ICD-10-CM | POA: Diagnosis not present

## 2015-06-11 DIAGNOSIS — O98713 Human immunodeficiency virus [HIV] disease complicating pregnancy, third trimester: Secondary | ICD-10-CM | POA: Diagnosis not present

## 2015-06-11 DIAGNOSIS — B20 Human immunodeficiency virus [HIV] disease: Secondary | ICD-10-CM | POA: Diagnosis not present

## 2015-06-11 DIAGNOSIS — F32A Depression, unspecified: Secondary | ICD-10-CM

## 2015-06-11 DIAGNOSIS — O9A313 Physical abuse complicating pregnancy, third trimester: Secondary | ICD-10-CM

## 2015-06-11 DIAGNOSIS — I10 Essential (primary) hypertension: Secondary | ICD-10-CM

## 2015-06-11 DIAGNOSIS — IMO0002 Reserved for concepts with insufficient information to code with codable children: Secondary | ICD-10-CM

## 2015-06-11 DIAGNOSIS — O99343 Other mental disorders complicating pregnancy, third trimester: Secondary | ICD-10-CM

## 2015-06-11 LAB — CBC WITH DIFFERENTIAL/PLATELET
BASOS PCT: 0 %
Basophils Absolute: 0 cells/uL (ref 0–200)
EOS ABS: 59 {cells}/uL (ref 15–500)
Eosinophils Relative: 1 %
HCT: 28.9 % — ABNORMAL LOW (ref 35.0–45.0)
Hemoglobin: 9.4 g/dL — ABNORMAL LOW (ref 11.7–15.5)
Lymphocytes Relative: 36 %
Lymphs Abs: 2124 cells/uL (ref 850–3900)
MCH: 25.2 pg — ABNORMAL LOW (ref 27.0–33.0)
MCHC: 32.5 g/dL (ref 32.0–36.0)
MCV: 77.5 fL — AB (ref 80.0–100.0)
MONO ABS: 531 {cells}/uL (ref 200–950)
MONOS PCT: 9 %
MPV: 9.6 fL (ref 7.5–12.5)
NEUTROS ABS: 3186 {cells}/uL (ref 1500–7800)
Neutrophils Relative %: 54 %
PLATELETS: 216 10*3/uL (ref 140–400)
RBC: 3.73 MIL/uL — ABNORMAL LOW (ref 3.80–5.10)
RDW: 13.9 % (ref 11.0–15.0)
WBC: 5.9 10*3/uL (ref 3.8–10.8)
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
## 2015-06-12 ENCOUNTER — Ambulatory Visit (INDEPENDENT_AMBULATORY_CARE_PROVIDER_SITE_OTHER): Payer: Medicaid Other | Admitting: Advanced Practice Midwife

## 2015-06-12 ENCOUNTER — Encounter: Payer: Self-pay | Admitting: Advanced Practice Midwife

## 2015-06-12 VITALS — BP 136/75 | HR 65 | Wt 226.0 lb

## 2015-06-12 DIAGNOSIS — I10 Essential (primary) hypertension: Secondary | ICD-10-CM

## 2015-06-12 DIAGNOSIS — R102 Pelvic and perineal pain: Secondary | ICD-10-CM

## 2015-06-12 DIAGNOSIS — O26893 Other specified pregnancy related conditions, third trimester: Secondary | ICD-10-CM

## 2015-06-12 DIAGNOSIS — O98713 Human immunodeficiency virus [HIV] disease complicating pregnancy, third trimester: Secondary | ICD-10-CM | POA: Diagnosis present

## 2015-06-12 DIAGNOSIS — Z21 Asymptomatic human immunodeficiency virus [HIV] infection status: Secondary | ICD-10-CM | POA: Diagnosis not present

## 2015-06-12 DIAGNOSIS — O10913 Unspecified pre-existing hypertension complicating pregnancy, third trimester: Secondary | ICD-10-CM | POA: Diagnosis not present

## 2015-06-12 LAB — POCT URINALYSIS DIP (DEVICE)
Bilirubin Urine: NEGATIVE
Glucose, UA: NEGATIVE mg/dL
Hgb urine dipstick: NEGATIVE
Ketones, ur: NEGATIVE mg/dL
Leukocytes, UA: NEGATIVE
Nitrite: NEGATIVE
Protein, ur: NEGATIVE mg/dL
Specific Gravity, Urine: 1.025 (ref 1.005–1.030)
Urobilinogen, UA: 0.2 mg/dL (ref 0.0–1.0)
pH: 5.5 (ref 5.0–8.0)

## 2015-06-12 LAB — HIV-1 RNA QUANT-NO REFLEX-BLD
HIV 1 RNA Quant: <20 copies/mL (ref <20)
HIV-1 RNA Quant, Log: <1.3 Log copies/mL (ref <1.30)

## 2015-06-12 MED ORDER — COMFORT FIT MATERNITY SUPP LG MISC
1.0000 | Freq: Every day | Status: DC
Start: 2015-06-12 — End: 2016-09-10

## 2015-06-12 NOTE — Patient Instructions (Signed)
Pelvic pain (SPD) °Approved by the [facility] Medical Advisory Board °Last reviewed: May 2014  °Show references Hide references  °Share ° °In this article °What is symphysis pubis dysfunction?  °What are the symptoms of SPD?  °What causes SPD?  °When does SPD happen?  °How is SPD diagnosed?  °How is SPD treated?  °How can I help to ease my pain?  °Will I recover from SPD after I've had my baby ? °What is symphysis pubis dysfunction?The symphysis pubis is a stiff joint that connects the two halves of your pelvis. This joint is strengthened by a dense network of tough, flexible tissues (ligaments). Your body produces a hormone called relaxin, which softens your ligaments in order to help your baby pass through your pelvis.  ° °Your pelvic joints move more during and just after pregnancy (Bjorkland et al 1999, Bjorkland et al 2000, Kristiansson 1997, Vollestad et al 2012). This can cause inflammation and pain, and may lead to the condition symphysis pubis dysfunction (SPD).  ° °Diastasis symphysis pubis (DSP) is similar to SPD. This is when the gap in the pubic joint widens too far. The average gap between the bones in a non-pregnant woman is between 4mm and 5mm. During pregnancy it's normal for this gap to widen by 2mm or 3mm. If the gap is 10mm or more, DSP is diagnosed. It's rare, and can only be identified by X-ray (Parker & Bhattacharjee 2009).  °What are the symptoms of SPD?Pain in the pubic area and groin are the most common symptoms, though you may also have the following signs:  °Back pain, pelvic girdle pain or hip pain.  °A grinding or clicking sensation in your pubic area.  °Pain down the inside of your thighs or between your legs. It can be made worse by parting your legs, walking, going up or down stairs or moving around in bed.  °Worse pain at night. SPD can prevent you from sleeping well. Getting up to go to the toilet in the middle of the night can be especially painful. °What causes SPD?SPD  is thought to be caused by a combination of hormones that you produce during pregnancy, as well as the way your body moves. If one side of your pelvis moves more than the other when you walk or move around, the area around the symphysis pubis becomes tender (Buyruk et al 1999, Damen et al 2001, Vleeming et al 2005).  ° °The size of the gap in your joint doesn't bear any relation to the amount of pain you may feel. Many women with a normal-sized gap feel a lot of pain.  ° °You may be more likely to develop SPD if you started your periods before you were 11, or are overweight (Clark C 2010, Dennison et al 2009, Bjelland et al 2011). When does SPD happen?SPD can occur at any time during your pregnancy or after giving birth (Keriakos et al 2011). You may notice it for the first time during the middle of your pregnancy.  ° °If you have SPD in one pregnancy, it is more likely that you'll have it next time you get pregnant (Bjelland et al 2010, Shephered 1997, Snow 1997). The symptoms may also come on earlier and progress faster, so it is important to seek help promptly. It can help if you allow the symptoms from one pregnancy to settle before trying to get pregnant again. How is SPD diagnosed?SPD is becoming more widely understood by doctors, physiotherapists and midwives. Your doctor or   midwife should refer you to a women's health physiotherapist.  ° °Your physiotherapist will test the stability, movement and pain in your pelvic joints and muscles (De Groot et al 2006, Kristiansson and Svardsudd 1996, Vleeming et al 2002, Vleeming et al 2005). How is SPD treated?SPD is often managed in the same way as pelvic girdle pain. Treatment includes:  °Exercises, especially focused on your tummy and pelvic floor muscles. These will improve the stability of your pelvis and back (Richardson et al 2002, Van Wingerden et al 2004, Vleeming et al 2005). You may need gentle, hands-on treatment of your hip, back or pelvis to correct  stiffness or imbalance. Exercise in water can sometimes help.  °You should also be given advice on how to make daily activities less painful and on how to make the birth of your baby easier. Your midwife should help you to write a birth plan which takes into account your SPD symptoms.  °Acupuncture may help and is safe during pregnancy. Make sure your practitioner is trained and experienced in working with pregnant women (Bourne 2007, Elden et al 2007, Elden et al 2008, Kvorning et al 2004, Lund et al 2006, Ternov et al 2001).  °Osteopathy and chiropractic treatment may help, but again, see a registered practitioner who is experienced in treating pregnant women (Licciardone et al 2010).  °A pelvic support belt will give quick relief (Mens et al 2006, Ostgaard et al 1994, Vleeming 1992). °How can I help to ease my pain?  °Do pelvic floor and tummy exercises. Get down onto your hands and knees and level your back so that it is roughly flat. Breathe in and then as you breathe out, squeeze in your pelvic floor muscles and pull your belly button in and up. Hold this contraction for between five and 10 seconds, breathing through it. Relax your muscles slowly at the end of the exercise.  °Try not to move your legs apart when your back is slumped or when you are lying down. Take care when getting in and out of the car, bed or bath. If you are lying down, pull your knees up as far as you can to stop your pelvis from moving and make it easier to part your legs. If you are sitting, try arching your back and sticking your chest out before parting or moving your legs.  °Don't push through your pain. If something hurts, stop doing it. If the pain is allowed to flare up, it can take a long time to settle down again.  °Move little and often. You may not feel the effects of what you are doing until later in the day or after you have gone to bed.  °Rest regularly by sitting on a birth ball or by getting down on your hands and knees.  This takes the weight of your baby off your pelvis and holds it in a stable position.  °Try not to do heavy lifting or pushing. Supermarket trolleys can often make your pain worse, so shop online or ask someone to shop for you.  °When climbing stairs, take one step at a time. Step up onto one step with your best leg and then bring your other leg to meet it. Repeat with each step.  °Avoid swimming breaststroke and take care with other strokes. You may feel swimming is helping your pain while you are in the water, but it could make you feel worse when you get out.  °When getting dressed, sit down to pull on   your knickers or trousers. °Will I recover from SPD after I've had my baby ?You're very likely to recover after your baby is born (Owens et al 2002). If you can, carry on with physiotherapy after the birth. Try to get help with looking after your baby during the early weeks.  ° °You may find you get twinges every month just before your period is due. This is caused by hormones that have a similar effect to the pregnancy hormone relaxin.  °
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
## 2015-06-13 LAB — T-HELPER CELL (CD4) - (RCID CLINIC ONLY)
CD4 T CELL ABS: 870 /uL (ref 400–2700)
CD4 T CELL HELPER: 41 % (ref 33–55)

## 2015-06-14 ENCOUNTER — Ambulatory Visit (INDEPENDENT_AMBULATORY_CARE_PROVIDER_SITE_OTHER): Payer: Medicaid Other | Admitting: *Deleted

## 2015-06-14 ENCOUNTER — Ambulatory Visit (HOSPITAL_COMMUNITY)
Admission: RE | Admit: 2015-06-14 | Discharge: 2015-06-14 | Disposition: A | Discharge status: Home / Self Care | Payer: Medicaid Other | Source: Ambulatory Visit | Attending: Obstetrics & Gynecology | Admitting: Obstetrics & Gynecology

## 2015-06-14 ENCOUNTER — Other Ambulatory Visit: Payer: Self-pay | Admitting: Obstetrics & Gynecology

## 2015-06-14 VITALS — BP 137/74 | HR 89

## 2015-06-14 DIAGNOSIS — Z8759 Personal history of other complications of pregnancy, childbirth and the puerperium: Secondary | ICD-10-CM

## 2015-06-14 DIAGNOSIS — O10913 Unspecified pre-existing hypertension complicating pregnancy, third trimester: Secondary | ICD-10-CM

## 2015-06-14 DIAGNOSIS — Z3A35 35 weeks gestation of pregnancy: Secondary | ICD-10-CM | POA: Diagnosis not present

## 2015-06-14 DIAGNOSIS — O98713 Human immunodeficiency virus [HIV] disease complicating pregnancy, third trimester: Secondary | ICD-10-CM

## 2015-06-14 DIAGNOSIS — O10013 Pre-existing essential hypertension complicating pregnancy, third trimester: Secondary | ICD-10-CM | POA: Insufficient documentation

## 2015-06-14 DIAGNOSIS — O99213 Obesity complicating pregnancy, third trimester: Secondary | ICD-10-CM | POA: Diagnosis not present

## 2015-06-14 DIAGNOSIS — O09293 Supervision of pregnancy with other poor reproductive or obstetric history, third trimester: Secondary | ICD-10-CM | POA: Diagnosis not present

## 2015-06-14 IMAGING — US US MFM OB LIMITED
1 series · 15 of 28 positions shown · non-contrast
Comparison: none

[Series 1: us mfm ob limited · 41 acquisitions, 15 frames shown]
[im 1/41]
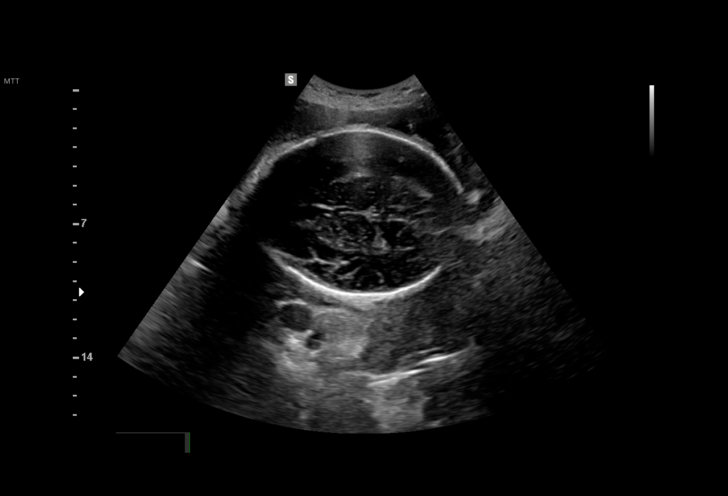
[im 3/41]
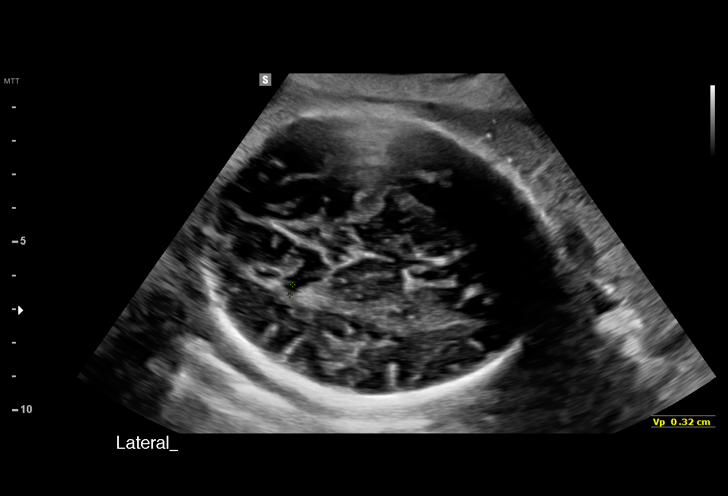
[im 6/41]
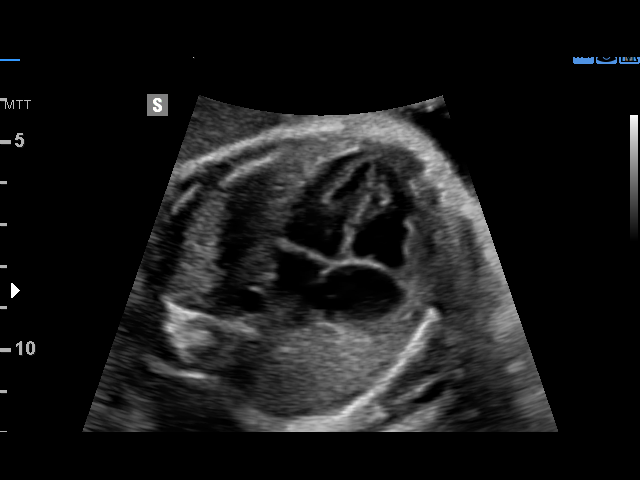
[im 9/41]
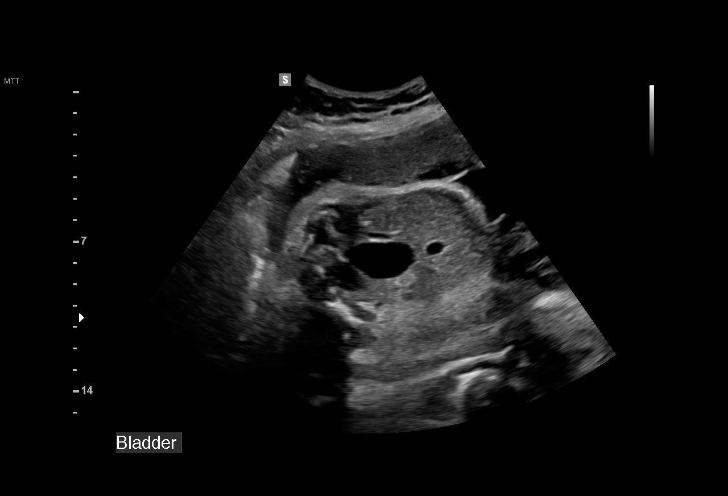
[im 12/41]
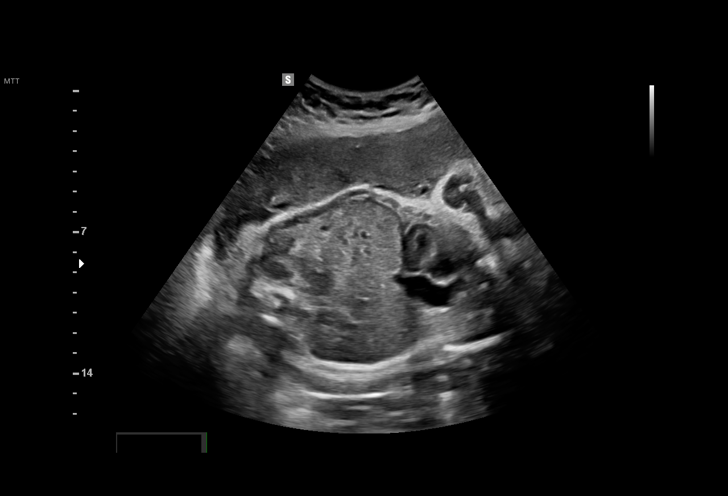
[im 15/41]
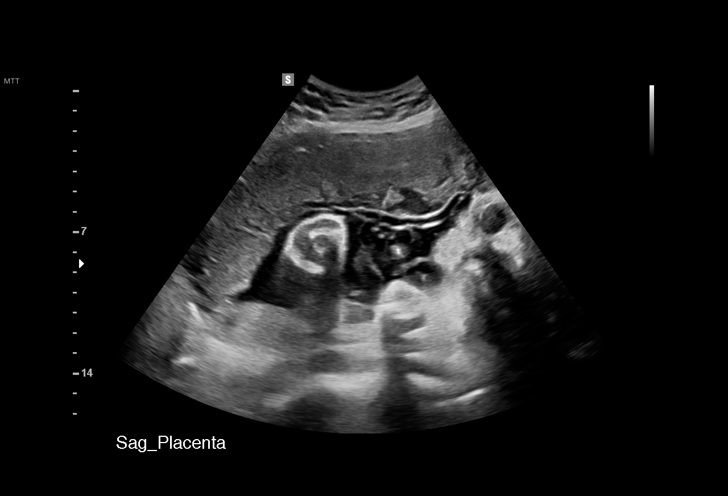
[im 18/41]
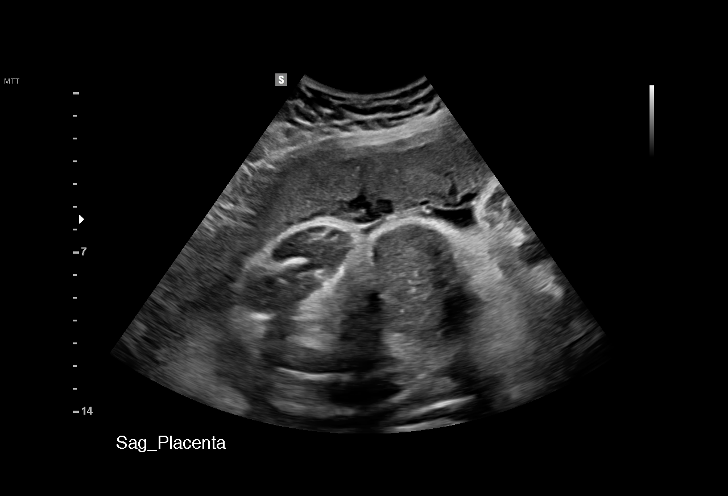
[im 21/41]
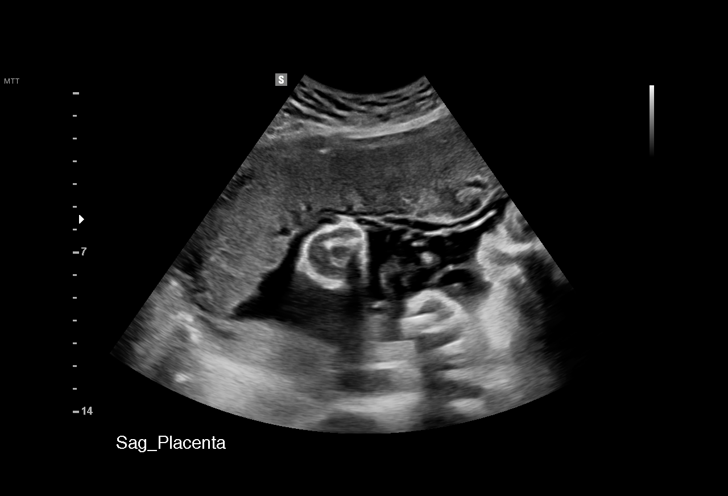
[im 23/41]
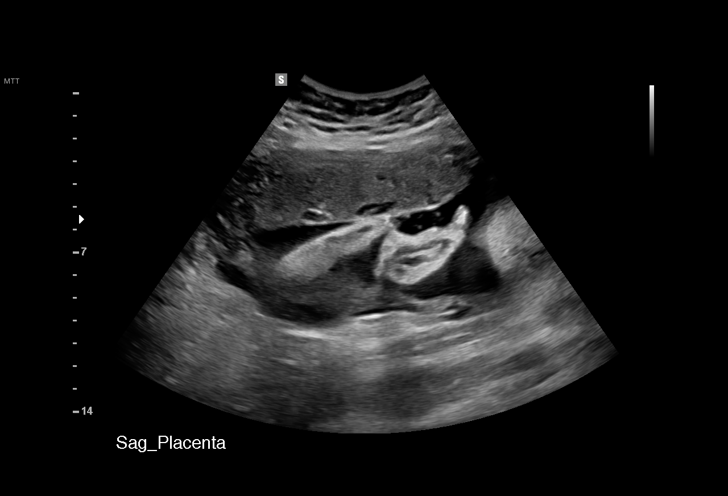
[im 26/41]
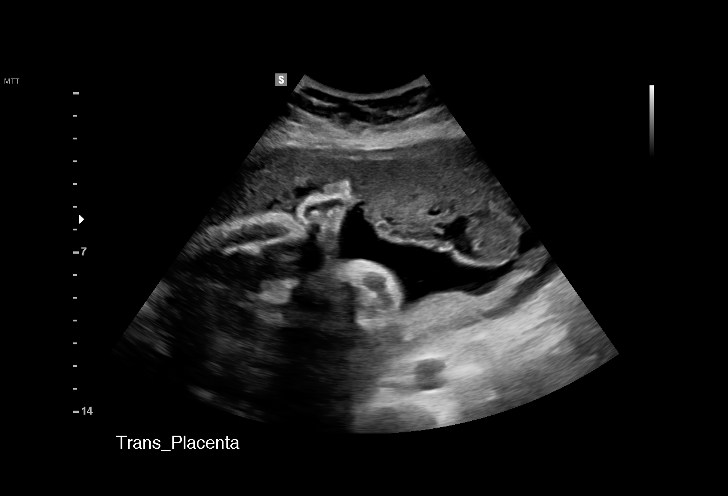
[im 29/41]
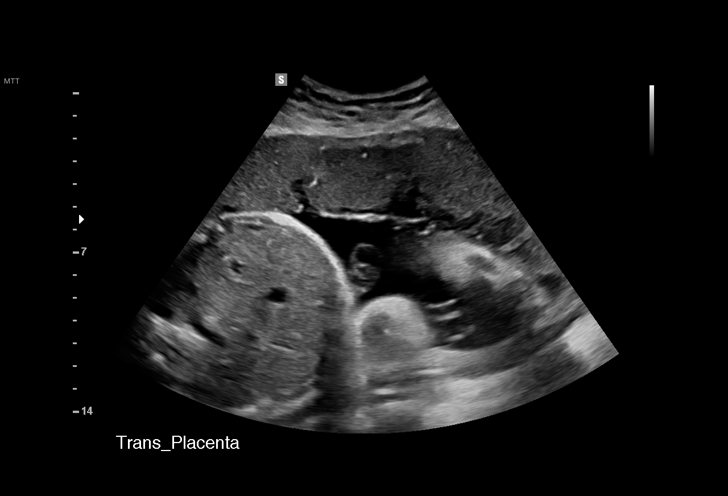
[im 32/41]
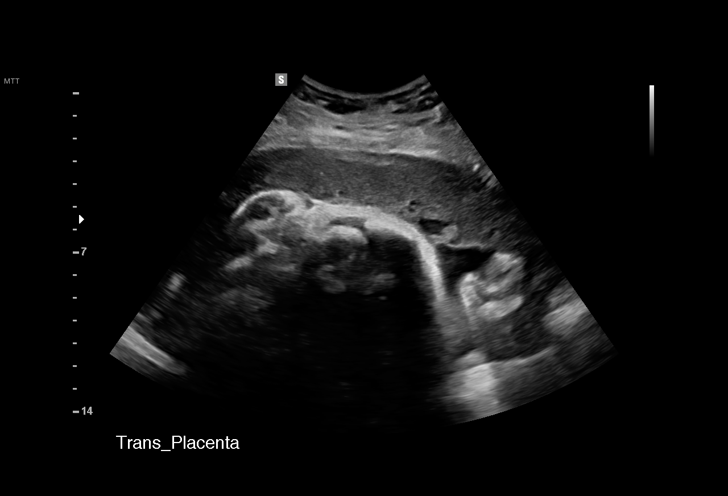
[im 35/41]
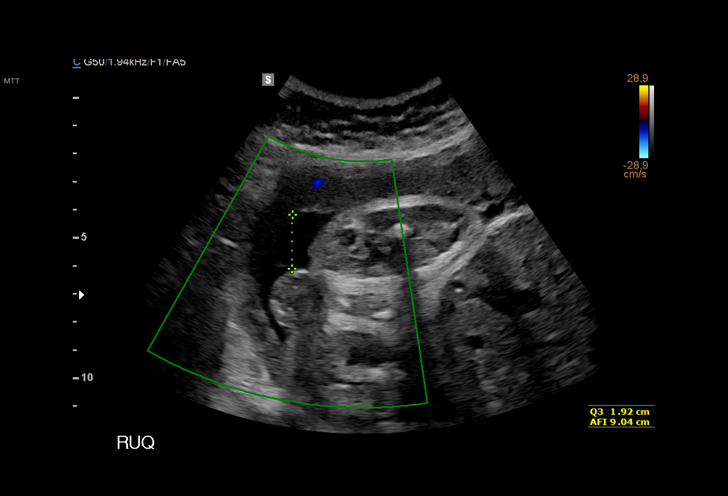
[im 38/41]
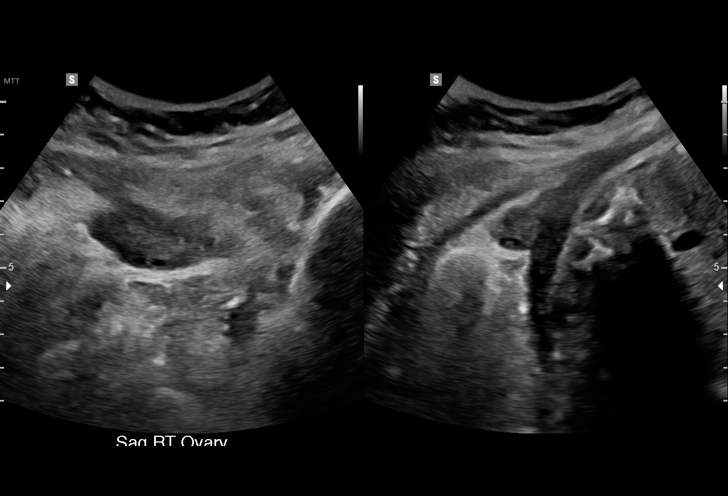
[im 41/41]
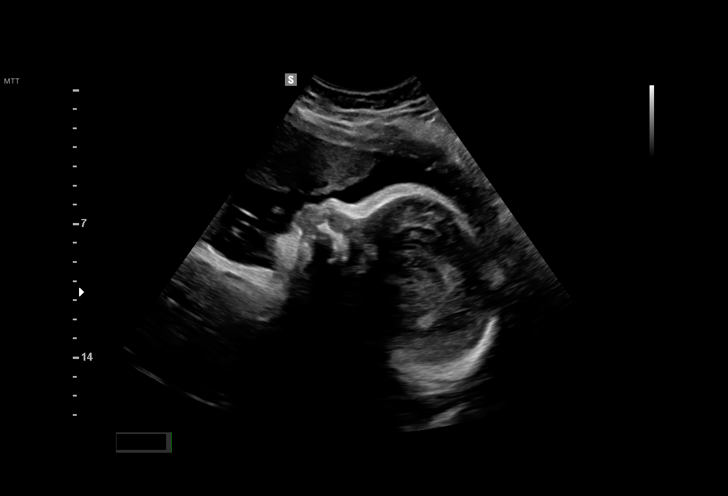

[15 of 28 positions shown; findings below may reference images not displayed]

Hospital Clinic-
Faculty Physician
OB/Gyn Clinic
[REDACTED]

1  RY[facility]           [PHONE_NUMBER]      [PHONE_NUMBER]     [PHONE_NUMBER]
Indications

35 weeks gestation of pregnancy
HIV affecting pregnancy, third trimester; on   [KU]
meds
Hypertension - Chronic/Pre-existing (on        [KU]
labetalol)
Poor obstetric history: Previous               [KU]
preeclampsia / eclampsia/gestational HTN
Obesity complicating pregnancy, third          [KU]
trimester
OB History

Blood Type:            Height:  5'2"   Weight (lb):  224       BMI:
Gravidity:    4         Term:   3        Prem:   0         SAB:   0
TOP:          0       Ectopic:  0        Living: 3
Fetal Evaluation

Num Of Fetuses:     1
Fetal Heart         125
Rate(bpm):
Cardiac Activity:   Observed
Presentation:       Cephalic
Placenta:           Anterior, above cervical os
P. Cord Insertion:  Previously Visualized
Amniotic Fluid
AFI FV:      Subjectively within normal limits

AFI Sum(cm)     %Tile       Largest Pocket(cm)
12.77           41

RUQ(cm)       RLQ(cm)       LUQ(cm)        LLQ(cm)
3.53
Gestational Age

LMP:           35w 0d        Date:  [DATE]                 EDD:    [DATE]
Best:          35w 0d     Det. By:  LMP  ([DATE])          EDD:    [DATE]
Cervix Uterus Adnexa

Cervix
Not visualized (advanced GA >[KU])

Uterus
No abnormality visualized.

Left Ovary
Size(cm)     3.55   x   2.22   x  1.52      Vol(ml):
Within normal limits. No adnexal mass visualized.

Right Ovary
Size(cm)     3.53   x   1.59   x  1.81      Vol(ml):
Within normal limits. No adnexal mass visualized.

Cul De Sac:   No free fluid seen.

Adnexa:       No abnormality visualized.
Impression

SIUP at 35+0 weeks
Cephalic presentation
Normal amniotic fluid volume
Recommendations

Continue twice weekly NSTs with weekly AFIs

## 2015-06-14 NOTE — Progress Notes (Signed)
NST performed today was reviewed and was found to be reactive.  Continue recommended antenatal testing and prenatal care.  

## 2015-06-19 ENCOUNTER — Encounter: Payer: Self-pay | Admitting: Advanced Practice Midwife

## 2015-06-19 ENCOUNTER — Ambulatory Visit (INDEPENDENT_AMBULATORY_CARE_PROVIDER_SITE_OTHER): Payer: Medicaid Other | Admitting: Advanced Practice Midwife

## 2015-06-19 ENCOUNTER — Other Ambulatory Visit (HOSPITAL_COMMUNITY)
Admission: RE | Admit: 2015-06-19 | Discharge: 2015-06-19 | Disposition: A | Discharge status: Home / Self Care | Payer: Medicaid Other | Source: Ambulatory Visit | Attending: Obstetrics and Gynecology | Admitting: Obstetrics and Gynecology

## 2015-06-19 VITALS — BP 136/82 | HR 75 | Wt 224.5 lb

## 2015-06-19 DIAGNOSIS — Z113 Encounter for screening for infections with a predominantly sexual mode of transmission: Secondary | ICD-10-CM | POA: Insufficient documentation

## 2015-06-19 DIAGNOSIS — O10913 Unspecified pre-existing hypertension complicating pregnancy, third trimester: Secondary | ICD-10-CM | POA: Diagnosis not present

## 2015-06-19 DIAGNOSIS — O98713 Human immunodeficiency virus [HIV] disease complicating pregnancy, third trimester: Secondary | ICD-10-CM

## 2015-06-19 DIAGNOSIS — Z21 Asymptomatic human immunodeficiency virus [HIV] infection status: Secondary | ICD-10-CM

## 2015-06-19 LAB — POCT URINALYSIS DIP (DEVICE)
BILIRUBIN URINE: NEGATIVE
Glucose, UA: NEGATIVE mg/dL
Hgb urine dipstick: NEGATIVE
KETONES UR: NEGATIVE mg/dL
LEUKOCYTES UA: NEGATIVE
Nitrite: NEGATIVE
PH: 6 (ref 5.0–8.0)
Protein, ur: NEGATIVE mg/dL
SPECIFIC GRAVITY, URINE: 1.01 (ref 1.005–1.030)
Urobilinogen, UA: 0.2 mg/dL (ref 0.0–1.0)

## 2015-06-19 LAB — OB RESULTS CONSOLE GC/CHLAMYDIA: Gonorrhea: NEGATIVE

## 2015-06-19 LAB — OB RESULTS CONSOLE GBS: GBS: NEGATIVE
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
## 2015-06-19 NOTE — Progress Notes (Signed)
Pt reports having stronger and more frequent UC's x2 days.

## 2015-06-19 NOTE — Patient Instructions (Signed)

## 2015-06-20 LAB — OB RESULTS CONSOLE GC/CHLAMYDIA: CHLAMYDIA, DNA PROBE: NEGATIVE

## 2015-06-21 ENCOUNTER — Ambulatory Visit (INDEPENDENT_AMBULATORY_CARE_PROVIDER_SITE_OTHER): Payer: Medicaid Other | Admitting: *Deleted

## 2015-06-21 VITALS — BP 133/85 | HR 79

## 2015-06-21 DIAGNOSIS — Z36 Encounter for antenatal screening of mother: Secondary | ICD-10-CM | POA: Diagnosis not present

## 2015-06-21 DIAGNOSIS — O10913 Unspecified pre-existing hypertension complicating pregnancy, third trimester: Secondary | ICD-10-CM

## 2015-06-21 LAB — GC/CHLAMYDIA PROBE AMP (~~LOC~~) NOT AT ARMC
Chlamydia: NEGATIVE
Neisseria Gonorrhea: NEGATIVE

## 2015-06-21 LAB — CULTURE, BETA STREP (GROUP B ONLY)

## 2015-06-21 NOTE — Progress Notes (Signed)
NST today reactive 

## 2015-06-21 NOTE — Progress Notes (Signed)
Pt reports constipation despite use of Miralax x4 days. Per Dr. Adrian BlackwaterStinson, pt may try Bisocodyl suppository and if no results, she may try Fleet enema.

## 2015-06-25 ENCOUNTER — Ambulatory Visit (HOSPITAL_COMMUNITY)
Admission: RE | Admit: 2015-06-25 | Discharge: 2015-06-25 | Disposition: A | Discharge status: Home / Self Care | Payer: Medicaid Other | Source: Ambulatory Visit | Attending: Advanced Practice Midwife | Admitting: Advanced Practice Midwife

## 2015-06-25 ENCOUNTER — Ambulatory Visit (INDEPENDENT_AMBULATORY_CARE_PROVIDER_SITE_OTHER): Payer: Medicaid Other | Admitting: *Deleted

## 2015-06-25 ENCOUNTER — Other Ambulatory Visit: Payer: Self-pay | Admitting: Advanced Practice Midwife

## 2015-06-25 ENCOUNTER — Other Ambulatory Visit: Payer: Self-pay | Admitting: Internal Medicine

## 2015-06-25 VITALS — BP 135/84 | HR 76

## 2015-06-25 DIAGNOSIS — O210 Mild hyperemesis gravidarum: Secondary | ICD-10-CM

## 2015-06-25 DIAGNOSIS — O10913 Unspecified pre-existing hypertension complicating pregnancy, third trimester: Secondary | ICD-10-CM

## 2015-06-25 DIAGNOSIS — Z3A36 36 weeks gestation of pregnancy: Secondary | ICD-10-CM | POA: Diagnosis not present

## 2015-06-25 DIAGNOSIS — O10013 Pre-existing essential hypertension complicating pregnancy, third trimester: Secondary | ICD-10-CM | POA: Insufficient documentation

## 2015-06-25 DIAGNOSIS — O99213 Obesity complicating pregnancy, third trimester: Secondary | ICD-10-CM | POA: Diagnosis not present

## 2015-06-25 DIAGNOSIS — O10019 Pre-existing essential hypertension complicating pregnancy, unspecified trimester: Secondary | ICD-10-CM

## 2015-06-25 DIAGNOSIS — O09293 Supervision of pregnancy with other poor reproductive or obstetric history, third trimester: Secondary | ICD-10-CM | POA: Insufficient documentation

## 2015-06-25 DIAGNOSIS — O98713 Human immunodeficiency virus [HIV] disease complicating pregnancy, third trimester: Secondary | ICD-10-CM | POA: Diagnosis present

## 2015-06-25 NOTE — Progress Notes (Signed)
NST today reactive 

## 2015-06-29 ENCOUNTER — Encounter (HOSPITAL_COMMUNITY): Payer: Self-pay | Admitting: *Deleted

## 2015-06-29 ENCOUNTER — Inpatient Hospital Stay (HOSPITAL_COMMUNITY)
Admission: AD | Admit: 2015-06-29 | Discharge: 2015-06-29 | Disposition: A | Discharge status: Home / Self Care | Payer: Medicaid Other | Source: Ambulatory Visit | Attending: Obstetrics and Gynecology | Admitting: Obstetrics and Gynecology

## 2015-06-29 DIAGNOSIS — O99019 Anemia complicating pregnancy, unspecified trimester: Secondary | ICD-10-CM

## 2015-06-29 DIAGNOSIS — T7491XA Unspecified adult maltreatment, confirmed, initial encounter: Secondary | ICD-10-CM

## 2015-06-29 DIAGNOSIS — Z3493 Encounter for supervision of normal pregnancy, unspecified, third trimester: Secondary | ICD-10-CM | POA: Diagnosis not present

## 2015-06-29 DIAGNOSIS — F1290 Cannabis use, unspecified, uncomplicated: Secondary | ICD-10-CM

## 2015-06-29 LAB — URINALYSIS, ROUTINE W REFLEX MICROSCOPIC
Bilirubin Urine: NEGATIVE
Glucose, UA: NEGATIVE mg/dL
Hgb urine dipstick: NEGATIVE
Ketones, ur: NEGATIVE mg/dL
Leukocytes, UA: NEGATIVE
Nitrite: NEGATIVE
Protein, ur: NEGATIVE mg/dL
Specific Gravity, Urine: 1.01 (ref 1.005–1.030)
pH: 5.5 (ref 5.0–8.0)

## 2015-06-29 MED ORDER — OXYCODONE-ACETAMINOPHEN 5-325 MG PO TABS
1.0000 | ORAL_TABLET | Freq: Once | ORAL | Status: AC
  Administered 2015-06-29: 1 via ORAL
  Filled 2015-06-29: qty 1

## 2015-06-29 NOTE — Discharge Instructions (Signed)

## 2015-06-29 NOTE — MAU Note (Signed)
Contractions since 2100. Denies LOF or bleeding

## 2015-07-02 ENCOUNTER — Other Ambulatory Visit: Payer: Medicaid Other

## 2015-07-05 ENCOUNTER — Inpatient Hospital Stay (HOSPITAL_COMMUNITY)
Admission: RE | Admit: 2015-07-05 | Discharge: 2015-07-08 | DRG: 774 | Disposition: A | Discharge status: Home / Self Care | Payer: Medicaid Other | Source: Ambulatory Visit | Attending: Family Medicine | Admitting: Family Medicine

## 2015-07-05 ENCOUNTER — Encounter (HOSPITAL_COMMUNITY): Payer: Self-pay | Admitting: *Deleted

## 2015-07-05 ENCOUNTER — Ambulatory Visit (INDEPENDENT_AMBULATORY_CARE_PROVIDER_SITE_OTHER): Payer: Medicaid Other | Admitting: *Deleted

## 2015-07-05 ENCOUNTER — Ambulatory Visit (HOSPITAL_COMMUNITY): Payer: Medicaid Other

## 2015-07-05 VITALS — BP 191/100 | HR 57

## 2015-07-05 DIAGNOSIS — O9902 Anemia complicating childbirth: Secondary | ICD-10-CM | POA: Diagnosis present

## 2015-07-05 DIAGNOSIS — O10913 Unspecified pre-existing hypertension complicating pregnancy, third trimester: Secondary | ICD-10-CM | POA: Diagnosis present

## 2015-07-05 DIAGNOSIS — O99214 Obesity complicating childbirth: Secondary | ICD-10-CM | POA: Diagnosis present

## 2015-07-05 DIAGNOSIS — O99213 Obesity complicating pregnancy, third trimester: Secondary | ICD-10-CM | POA: Diagnosis present

## 2015-07-05 DIAGNOSIS — Z21 Asymptomatic human immunodeficiency virus [HIV] infection status: Secondary | ICD-10-CM | POA: Diagnosis present

## 2015-07-05 DIAGNOSIS — O1092 Unspecified pre-existing hypertension complicating childbirth: Secondary | ICD-10-CM | POA: Diagnosis not present

## 2015-07-05 DIAGNOSIS — T7491XA Unspecified adult maltreatment, confirmed, initial encounter: Secondary | ICD-10-CM

## 2015-07-05 DIAGNOSIS — O114 Pre-existing hypertension with pre-eclampsia, complicating childbirth: Principal | ICD-10-CM | POA: Diagnosis present

## 2015-07-05 DIAGNOSIS — O99012 Anemia complicating pregnancy, second trimester: Secondary | ICD-10-CM | POA: Diagnosis present

## 2015-07-05 DIAGNOSIS — Z87891 Personal history of nicotine dependence: Secondary | ICD-10-CM | POA: Diagnosis not present

## 2015-07-05 DIAGNOSIS — Z833 Family history of diabetes mellitus: Secondary | ICD-10-CM | POA: Diagnosis not present

## 2015-07-05 DIAGNOSIS — O1002 Pre-existing essential hypertension complicating childbirth: Secondary | ICD-10-CM | POA: Diagnosis present

## 2015-07-05 DIAGNOSIS — O119 Pre-existing hypertension with pre-eclampsia, unspecified trimester: Secondary | ICD-10-CM | POA: Diagnosis present

## 2015-07-05 DIAGNOSIS — O99019 Anemia complicating pregnancy, unspecified trimester: Secondary | ICD-10-CM

## 2015-07-05 DIAGNOSIS — O9872 Human immunodeficiency virus [HIV] disease complicating childbirth: Secondary | ICD-10-CM | POA: Diagnosis present

## 2015-07-05 DIAGNOSIS — D573 Sickle-cell trait: Secondary | ICD-10-CM | POA: Diagnosis present

## 2015-07-05 DIAGNOSIS — F1290 Cannabis use, unspecified, uncomplicated: Secondary | ICD-10-CM

## 2015-07-05 DIAGNOSIS — Z3A38 38 weeks gestation of pregnancy: Secondary | ICD-10-CM | POA: Diagnosis not present

## 2015-07-05 DIAGNOSIS — O98719 Human immunodeficiency virus [HIV] disease complicating pregnancy, unspecified trimester: Secondary | ICD-10-CM | POA: Diagnosis present

## 2015-07-05 DIAGNOSIS — B20 Human immunodeficiency virus [HIV] disease: Secondary | ICD-10-CM | POA: Diagnosis present

## 2015-07-05 DIAGNOSIS — O10919 Unspecified pre-existing hypertension complicating pregnancy, unspecified trimester: Secondary | ICD-10-CM | POA: Diagnosis present

## 2015-07-05 DIAGNOSIS — IMO0002 Reserved for concepts with insufficient information to code with codable children: Secondary | ICD-10-CM

## 2015-07-05 DIAGNOSIS — O0990 Supervision of high risk pregnancy, unspecified, unspecified trimester: Secondary | ICD-10-CM

## 2015-07-05 DIAGNOSIS — Z6841 Body Mass Index (BMI) 40.0 and over, adult: Secondary | ICD-10-CM

## 2015-07-05 HISTORY — DX: Personal history of other diseases of the female genital tract: Z87.42

## 2015-07-05 LAB — COMPREHENSIVE METABOLIC PANEL
ALBUMIN: 2.6 g/dL — AB (ref 3.5–5.0)
ALT: 9 U/L — ABNORMAL LOW (ref 14–54)
AST: 11 U/L — AB (ref 15–41)
Alkaline Phosphatase: 103 U/L (ref 38–126)
Anion gap: 6 (ref 5–15)
BUN: 9 mg/dL (ref 6–20)
CHLORIDE: 107 mmol/L (ref 101–111)
CO2: 24 mmol/L (ref 22–32)
Calcium: 8.5 mg/dL — ABNORMAL LOW (ref 8.9–10.3)
Creatinine, Ser: 0.57 mg/dL (ref 0.44–1.00)
GFR calc Af Amer: >60 mL/min (ref >60)
Glucose, Bld: 75 mg/dL (ref 65–99)
POTASSIUM: 4.4 mmol/L (ref 3.5–5.1)
Sodium: 137 mmol/L (ref 135–145)
Total Bilirubin: 0.3 mg/dL (ref 0.3–1.2)
Total Protein: 6.2 g/dL — ABNORMAL LOW (ref 6.5–8.1)

## 2015-07-05 LAB — PROTEIN / CREATININE RATIO, URINE
CREATININE, URINE: 53 mg/dL
Total Protein, Urine: <6 mg/dL

## 2015-07-05 LAB — TYPE AND SCREEN
ABO/RH(D): A POS
ANTIBODY SCREEN: NEGATIVE

## 2015-07-05 LAB — CBC
HEMATOCRIT: 27 % — AB (ref 36.0–46.0)
HEMOGLOBIN: 8.7 g/dL — AB (ref 12.0–15.0)
MCH: 24 pg — ABNORMAL LOW (ref 26.0–34.0)
MCHC: 32.2 g/dL (ref 30.0–36.0)
MCV: 74.4 fL — ABNORMAL LOW (ref 78.0–100.0)
Platelets: 218 10*3/uL (ref 150–400)
RBC: 3.63 MIL/uL — ABNORMAL LOW (ref 3.87–5.11)
RDW: 13.3 % (ref 11.5–15.5)
WBC: 5.1 10*3/uL (ref 4.0–10.5)

## 2015-07-05 MED ORDER — ONDANSETRON HCL 4 MG/2ML IJ SOLN
4.0000 mg | Freq: Four times a day (QID) | INTRAMUSCULAR | Status: DC | PRN
  Administered 2015-07-05: 4 mg via INTRAVENOUS
  Filled 2015-07-05: qty 2

## 2015-07-05 MED ORDER — FENTANYL CITRATE (PF) 100 MCG/2ML IJ SOLN
100.0000 ug | INTRAMUSCULAR | Status: DC | PRN
  Administered 2015-07-05 – 2015-07-06 (×2): 100 ug via INTRAVENOUS
  Filled 2015-07-05 (×2): qty 2

## 2015-07-05 MED ORDER — LACTATED RINGERS IV SOLN: 500.0000 mL | INTRAVENOUS | Status: DC | PRN

## 2015-07-05 MED ORDER — OXYTOCIN 40 UNITS IN LACTATED RINGERS INFUSION - SIMPLE MED
1.0000 m[IU]/min | INTRAVENOUS | Status: DC
  Administered 2015-07-06: 2 m[IU]/min via INTRAVENOUS
  Filled 2015-07-05: qty 1000

## 2015-07-05 MED ORDER — TERBUTALINE SULFATE 1 MG/ML IJ SOLN: 0.2500 mg | Freq: Once | INTRAMUSCULAR | Status: DC | PRN

## 2015-07-05 MED ORDER — EMTRICITABINE-TENOFOVIR AF 200-25 MG PO TABS
1.0000 | ORAL_TABLET | Freq: Every day | ORAL | Status: DC
  Filled 2015-07-05 (×2): qty 1

## 2015-07-05 MED ORDER — LABETALOL HCL 200 MG PO TABS
200.0000 mg | ORAL_TABLET | Freq: Every day | ORAL | Status: DC
  Filled 2015-07-05: qty 1

## 2015-07-05 MED ORDER — MAGNESIUM SULFATE 50 % IJ SOLN
2.0000 g/h | INTRAVENOUS | Status: DC
  Administered 2015-07-05: 2 g/h via INTRAVENOUS
  Filled 2015-07-05: qty 80

## 2015-07-05 MED ORDER — LABETALOL HCL 5 MG/ML IV SOLN
20.0000 mg | INTRAVENOUS | Status: DC | PRN
Start: 2015-07-05 — End: 2015-07-06
  Administered 2015-07-06: 20 mg via INTRAVENOUS
  Filled 2015-07-05: qty 4

## 2015-07-05 MED ORDER — EMTRICITABINE-TENOFOVIR AF 200-25 MG PO TABS
1.0000 | ORAL_TABLET | Freq: Every day | ORAL | Status: DC
  Administered 2015-07-05 – 2015-07-07 (×3): 1 via ORAL
  Filled 2015-07-05 (×3): qty 1

## 2015-07-05 MED ORDER — OXYCODONE-ACETAMINOPHEN 5-325 MG PO TABS: 2.0000 | ORAL_TABLET | ORAL | Status: DC | PRN

## 2015-07-05 MED ORDER — ACETAMINOPHEN 325 MG PO TABS: 650.0000 mg | ORAL_TABLET | ORAL | Status: DC | PRN

## 2015-07-05 MED ORDER — SOD CITRATE-CITRIC ACID 500-334 MG/5ML PO SOLN: 30.0000 mL | ORAL | Status: DC | PRN

## 2015-07-05 MED ORDER — LABETALOL HCL 5 MG/ML IV SOLN
20.0000 mg | Freq: Once | INTRAVENOUS | Status: AC
  Administered 2015-07-05: 20 mg via INTRAVENOUS

## 2015-07-05 MED ORDER — PNEUMOCOCCAL VAC POLYVALENT 25 MCG/0.5ML IJ INJ
0.5000 mL | INJECTION | INTRAMUSCULAR | Status: DC
  Filled 2015-07-05: qty 0.5

## 2015-07-05 MED ORDER — LIDOCAINE HCL (PF) 1 % IJ SOLN: 30.0000 mL | INTRAMUSCULAR | Status: DC | PRN

## 2015-07-05 MED ORDER — ZIDOVUDINE 10 MG/ML IV SOLN
1.0000 mg/kg/h | INTRAVENOUS | Status: DC
  Administered 2015-07-05 – 2015-07-06 (×3): 1 mg/kg/h via INTRAVENOUS
  Filled 2015-07-05 (×3): qty 40

## 2015-07-05 MED ORDER — DEXTROSE 5 % IV SOLN
2.0000 mg/kg | Freq: Once | INTRAVENOUS | Status: AC
  Administered 2015-07-05: 207 mg via INTRAVENOUS
  Filled 2015-07-05: qty 20.7

## 2015-07-05 MED ORDER — OXYTOCIN 40 UNITS IN LACTATED RINGERS INFUSION - SIMPLE MED: 2.5000 [IU]/h | INTRAVENOUS | Status: DC

## 2015-07-05 MED ORDER — LABETALOL HCL 200 MG PO TABS
200.0000 mg | ORAL_TABLET | Freq: Two times a day (BID) | ORAL | Status: DC
  Administered 2015-07-05 (×2): 200 mg via ORAL
  Filled 2015-07-05 (×3): qty 1

## 2015-07-05 MED ORDER — LABETALOL HCL 200 MG PO TABS
200.0000 mg | ORAL_TABLET | Freq: Three times a day (TID) | ORAL | Status: DC
  Filled 2015-07-05 (×2): qty 1

## 2015-07-05 MED ORDER — OXYTOCIN BOLUS FROM INFUSION
500.0000 mL | INTRAVENOUS | Status: DC
  Administered 2015-07-06: 500 mL via INTRAVENOUS

## 2015-07-05 MED ORDER — LABETALOL HCL 5 MG/ML IV SOLN
INTRAVENOUS | Status: AC
  Filled 2015-07-05: qty 4

## 2015-07-05 MED ORDER — DARUNAVIR-COBICISTAT 800-150 MG PO TABS
1.0000 | ORAL_TABLET | Freq: Every day | ORAL | Status: DC
  Administered 2015-07-05 – 2015-07-07 (×3): 1 via ORAL
  Filled 2015-07-05 (×3): qty 1

## 2015-07-05 MED ORDER — OXYCODONE-ACETAMINOPHEN 5-325 MG PO TABS: 1.0000 | ORAL_TABLET | ORAL | Status: DC | PRN

## 2015-07-05 MED ORDER — MAGNESIUM SULFATE BOLUS VIA INFUSION
4.0000 g | Freq: Once | INTRAVENOUS | Status: DC
  Filled 2015-07-05: qty 500

## 2015-07-05 MED ORDER — HYDRALAZINE HCL 20 MG/ML IJ SOLN: 10.0000 mg | Freq: Once | INTRAMUSCULAR | Status: DC | PRN

## 2015-07-05 MED ORDER — MISOPROSTOL 25 MCG QUARTER TABLET
25.0000 ug | ORAL_TABLET | ORAL | Status: DC | PRN
  Administered 2015-07-05 (×2): 25 ug via VAGINAL
  Filled 2015-07-05 (×3): qty 0.25

## 2015-07-05 MED ORDER — LACTATED RINGERS IV SOLN
INTRAVENOUS | Status: DC
  Administered 2015-07-05: 22:00:00 via INTRAVENOUS

## 2015-07-05 NOTE — Progress Notes (Signed)
Labor Progress Note Catherine MccallumMarkita L Simmons is a 30 y.o. [phone] at 8024w1d presented for IOL for SIPE  S: Feeling well. Occasional ctx. Reports visual changes, floaters  O:  BP 173/108 mmHg  Pulse 71  Temp(Src) 97.9 F (36.6 C)  Resp 16  Ht 5\' 2"  (1.575 m)  Wt 228 lb (103.42 kg)  BMI 41.69 kg/m2  LMP 10/12/2014 EFM: 125/mod/+accels, no decels  CVE: Dilation: 2.5 Effacement (%): 50 Cervical Position: Posterior Station: -2 Presentation: Vertex Exam by:: Dr.Jhett Fretwell  FB placed and inflated with 60cc  A&P: 30 y.o. [id number] 5424w1d here for IOL #Labor: FB placed. Continued cytotec #Pain: prn fentanyl #FWB: Cat I #GBS neg #Preeclampsia with severe features (severe range BP): Start Magnesium with bolus and infusion.   Federico FlakeKimberly Niles Jahmire Ruffins, MD 8:36 PM

## 2015-07-05 NOTE — Progress Notes (Signed)
Pt reports having a slight headache today - pain scale 3. She also is seeing some spots today. Report called to Dr. Shawnie PonsPratt - pt to be admitted for IOL today. Pt states she needs to get her child situated with care and will return to the hospital in 1 hour.

## 2015-07-05 NOTE — Progress Notes (Signed)
NST reviewed and reactive. Given significant elevation of BP-->would suggest admission and delivery.

## 2015-07-05 NOTE — Anesthesia Pain Management Evaluation Note (Signed)
  CRNA Pain Management Visit Note  Patient: Catherine Simmons, 30 y.o., female  "Hello I am a member of the anesthesia [facility] at [facility]. We have an anesthesia team available at all times to provide care throughout the hospital, including epidural management and anesthesia for C-section. I don't know your plan for the delivery whether it a natural birth, water birth, IV sedation, nitrous supplementation, doula or epidural, but we want to meet your pain goals."   1.Was your pain managed to your expectations on prior hospitalizations?   Yes   2.What is your expectation for pain management during this hospitalization?     Epidural  3.How can we help you reach that goal? epidural  Record the patient's initial score and the patient's pain goal.   Pain: 3  Pain Goal: 8 [facility] wants you to be able to say your pain was always managed very well.  Brenna Friesenhahn 07/05/2015
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
## 2015-07-06 ENCOUNTER — Encounter (HOSPITAL_COMMUNITY): Payer: Self-pay

## 2015-07-06 DIAGNOSIS — Z21 Asymptomatic human immunodeficiency virus [HIV] infection status: Secondary | ICD-10-CM

## 2015-07-06 DIAGNOSIS — O9902 Anemia complicating childbirth: Secondary | ICD-10-CM

## 2015-07-06 DIAGNOSIS — D649 Anemia, unspecified: Secondary | ICD-10-CM

## 2015-07-06 DIAGNOSIS — O114 Pre-existing hypertension with pre-eclampsia, complicating childbirth: Secondary | ICD-10-CM

## 2015-07-06 DIAGNOSIS — O9872 Human immunodeficiency virus [HIV] disease complicating childbirth: Secondary | ICD-10-CM

## 2015-07-06 DIAGNOSIS — O1092 Unspecified pre-existing hypertension complicating childbirth: Secondary | ICD-10-CM

## 2015-07-06 DIAGNOSIS — Z3A38 38 weeks gestation of pregnancy: Secondary | ICD-10-CM

## 2015-07-06 LAB — CBC
HEMATOCRIT: 27.8 % — AB (ref 36.0–46.0)
HEMOGLOBIN: 9.4 g/dL — AB (ref 12.0–15.0)
MCH: 25.3 pg — AB (ref 26.0–34.0)
MCHC: 33.8 g/dL (ref 30.0–36.0)
MCV: 74.9 fL — AB (ref 78.0–100.0)
Platelets: 239 10*3/uL (ref 150–400)
RBC: 3.71 MIL/uL — AB (ref 3.87–5.11)
RDW: 13.4 % (ref 11.5–15.5)
WBC: 7.6 10*3/uL (ref 4.0–10.5)

## 2015-07-06 LAB — RPR: RPR: NONREACTIVE

## 2015-07-06 MED ORDER — IBUPROFEN 400 MG PO TABS
400.0000 mg | ORAL_TABLET | Freq: Four times a day (QID) | ORAL | Status: DC
  Administered 2015-07-06 – 2015-07-08 (×10): 400 mg via ORAL
  Filled 2015-07-06 (×11): qty 1

## 2015-07-06 MED ORDER — PHENYLEPHRINE 40 MCG/ML (10ML) SYRINGE FOR IV PUSH (FOR BLOOD PRESSURE SUPPORT): 80.0000 ug | PREFILLED_SYRINGE | INTRAVENOUS | Status: DC | PRN

## 2015-07-06 MED ORDER — DIPHENHYDRAMINE HCL 25 MG PO CAPS: 25.0000 mg | ORAL_CAPSULE | Freq: Four times a day (QID) | ORAL | Status: DC | PRN

## 2015-07-06 MED ORDER — DIPHENHYDRAMINE HCL 50 MG/ML IJ SOLN: 12.5000 mg | INTRAMUSCULAR | Status: DC | PRN

## 2015-07-06 MED ORDER — MAGNESIUM SULFATE 50 % IJ SOLN
2.0000 g/h | INTRAVENOUS | Status: AC
  Administered 2015-07-06: 2 g/h via INTRAVENOUS
  Filled 2015-07-06 (×2): qty 80

## 2015-07-06 MED ORDER — MEASLES, MUMPS & RUBELLA VAC ~~LOC~~ INJ
0.5000 mL | INJECTION | Freq: Once | SUBCUTANEOUS | Status: DC
  Filled 2015-07-06: qty 0.5

## 2015-07-06 MED ORDER — PHENYLEPHRINE 40 MCG/ML (10ML) SYRINGE FOR IV PUSH (FOR BLOOD PRESSURE SUPPORT)
80.0000 ug | PREFILLED_SYRINGE | INTRAVENOUS | Status: DC | PRN
Start: 2015-07-06 — End: 2015-07-06

## 2015-07-06 MED ORDER — PRENATAL MULTIVITAMIN CH
1.0000 | ORAL_TABLET | Freq: Every day | ORAL | Status: DC
  Administered 2015-07-07 – 2015-07-08 (×2): 1 via ORAL
  Filled 2015-07-06 (×2): qty 1

## 2015-07-06 MED ORDER — NIFEDIPINE ER 30 MG PO TB24
30.0000 mg | ORAL_TABLET | Freq: Once | ORAL | Status: AC
  Administered 2015-07-06: 30 mg via ORAL
  Filled 2015-07-06: qty 1

## 2015-07-06 MED ORDER — EPHEDRINE 5 MG/ML INJ: 10.0000 mg | INTRAVENOUS | Status: DC | PRN

## 2015-07-06 MED ORDER — ONDANSETRON HCL 4 MG PO TABS: 4.0000 mg | ORAL_TABLET | ORAL | Status: DC | PRN

## 2015-07-06 MED ORDER — TETANUS-DIPHTH-ACELL PERTUSSIS 5-2.5-18.5 LF-MCG/0.5 IM SUSP
0.5000 mL | Freq: Once | INTRAMUSCULAR | Status: DC
  Filled 2015-07-06: qty 0.5

## 2015-07-06 MED ORDER — ACETAMINOPHEN 325 MG PO TABS
650.0000 mg | ORAL_TABLET | ORAL | Status: DC | PRN
  Administered 2015-07-06 – 2015-07-07 (×2): 650 mg via ORAL
  Filled 2015-07-06 (×2): qty 2

## 2015-07-06 MED ORDER — LACTATED RINGERS IV SOLN: 500.0000 mL | Freq: Once | INTRAVENOUS | Status: DC

## 2015-07-06 MED ORDER — OXYCODONE-ACETAMINOPHEN 5-325 MG PO TABS
1.0000 | ORAL_TABLET | ORAL | Status: DC | PRN
  Administered 2015-07-06: 2 via ORAL
  Administered 2015-07-06 – 2015-07-07 (×2): 1 via ORAL
  Filled 2015-07-06: qty 1
  Filled 2015-07-06 (×3): qty 2

## 2015-07-06 MED ORDER — SIMETHICONE 80 MG PO CHEW: 80.0000 mg | CHEWABLE_TABLET | ORAL | Status: DC | PRN

## 2015-07-06 MED ORDER — LACTATED RINGERS IV SOLN
INTRAVENOUS | Status: DC
  Administered 2015-07-06: 08:00:00 via INTRAVENOUS

## 2015-07-06 MED ORDER — COCONUT OIL OIL
1.0000 "application " | TOPICAL_OIL | Status: DC | PRN
  Filled 2015-07-06: qty 120

## 2015-07-06 MED ORDER — EPHEDRINE 5 MG/ML INJ
10.0000 mg | INTRAVENOUS | Status: DC | PRN
Start: 2015-07-06 — End: 2015-07-06

## 2015-07-06 MED ORDER — HYDROCHLOROTHIAZIDE 25 MG PO TABS
25.0000 mg | ORAL_TABLET | Freq: Every day | ORAL | Status: DC
  Administered 2015-07-07 – 2015-07-08 (×2): 25 mg via ORAL
  Filled 2015-07-06 (×3): qty 1

## 2015-07-06 MED ORDER — ZOLPIDEM TARTRATE 5 MG PO TABS: 5.0000 mg | ORAL_TABLET | Freq: Every evening | ORAL | Status: DC | PRN

## 2015-07-06 MED ORDER — FENTANYL 2.5 MCG/ML BUPIVACAINE 1/10 % EPIDURAL INFUSION (WH - ANES): 14.0000 mL/h | INTRAMUSCULAR | Status: DC | PRN

## 2015-07-06 MED ORDER — WITCH HAZEL-GLYCERIN EX PADS: 1.0000 "application " | MEDICATED_PAD | CUTANEOUS | Status: DC | PRN

## 2015-07-06 MED ORDER — BENZOCAINE-MENTHOL 20-0.5 % EX AERO
1.0000 "application " | INHALATION_SPRAY | CUTANEOUS | Status: DC | PRN
  Filled 2015-07-06: qty 56

## 2015-07-06 MED ORDER — DIBUCAINE 1 % RE OINT
1.0000 "application " | TOPICAL_OINTMENT | RECTAL | Status: DC | PRN
  Filled 2015-07-06: qty 56.7

## 2015-07-06 MED ORDER — ONDANSETRON HCL 4 MG/2ML IJ SOLN: 4.0000 mg | INTRAMUSCULAR | Status: DC | PRN

## 2015-07-06 MED ORDER — SENNOSIDES-DOCUSATE SODIUM 8.6-50 MG PO TABS
2.0000 | ORAL_TABLET | ORAL | Status: DC
  Administered 2015-07-07 – 2015-07-08 (×2): 2 via ORAL
  Filled 2015-07-06 (×2): qty 2
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
## 2015-07-07 MED ORDER — AMLODIPINE BESYLATE 5 MG PO TABS
5.0000 mg | ORAL_TABLET | Freq: Every day | ORAL | Status: DC
  Administered 2015-07-07 – 2015-07-08 (×2): 5 mg via ORAL
  Filled 2015-07-07 (×2): qty 1

## 2015-07-07 NOTE — Progress Notes (Signed)
Notified Zerita Boersarlene Lawson, CNM  Of Pts elevated blood pressures. Nursing care order to call MD or BPS > 160/100.

## 2015-07-07 NOTE — Progress Notes (Signed)
Post Partum Day 1 Subjective: no complaints, up ad lib, voiding and tolerating PO  Objective: Blood pressure 159/102, pulse 84, temperature 97.7 F (36.5 C), temperature source Oral, resp. rate 16, height 5\' 2"  (1.575 m), weight 228 lb (103.42 kg), last menstrual period 10/12/2014, SpO2 100 %, unknown if currently breastfeeding.  Physical Exam:  General: alert, cooperative and no distress Lochia: appropriate Uterine Fundus: firm DVT Evaluation: No evidence of DVT seen on physical exam. Negative Homan's sign. No cords or calf tenderness. No significant calf/ankle edema.   Recent Labs  07/05/15 1341 07/06/15 0226  HGB 8.7* 9.4*  HCT 27.0* 27.8*    Assessment/Plan: Plan for discharge tomorrow.   Magnesium off Add amlodipine.     LOS: 2 days   Fernanda Twaddell JEHIEL 07/07/2015, 7:01 AM

## 2015-07-08 MED ORDER — AMLODIPINE BESYLATE 5 MG PO TABS: 5.0000 mg | ORAL_TABLET | Freq: Every day | ORAL | Status: DC

## 2015-07-08 MED ORDER — SENNOSIDES-DOCUSATE SODIUM 8.6-50 MG PO TABS: 2.0000 | ORAL_TABLET | ORAL | Status: DC

## 2015-07-08 MED ORDER — IBUPROFEN 400 MG PO TABS: 400.0000 mg | ORAL_TABLET | Freq: Four times a day (QID) | ORAL | Status: DC

## 2015-07-08 MED ORDER — HYDROCHLOROTHIAZIDE 25 MG PO TABS: 25.0000 mg | ORAL_TABLET | Freq: Every day | ORAL | Status: DC
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
## 2015-07-08 NOTE — Discharge Instructions (Signed)
Vaginal Delivery, Care After Refer to this sheet in the next few weeks. These discharge instructions provide you with information on caring for yourself after delivery. Your health care provider may also give you specific instructions. Your treatment has been planned according to the most current medical practices available, but problems sometimes occur. Call your health care provider if you have any problems or questions after you go home. HOME CARE INSTRUCTIONS  Take over-the-counter or prescription medicines only as directed by your health care provider or pharmacist.  Do not drink alcohol, especially if you are breastfeeding or taking medicine to relieve pain.  Do not chew or smoke tobacco.  Do not use illegal drugs.  Continue to use good perineal care. Good perineal care includes:  Wiping your perineum from front to back.  Keeping your perineum clean.  Do not use tampons or douche until your health care provider says it is okay.  Shower, wash your hair, and take tub baths as directed by your health care provider.  Wear a well-fitting bra that provides breast support.  Eat healthy foods.  Drink enough fluids to keep your urine clear or pale yellow.  Eat high-fiber foods such as whole grain cereals and breads, brown rice, beans, and fresh fruits and vegetables every day. These foods may help prevent or relieve constipation.  Follow your health care provider's recommendations regarding resumption of activities such as climbing stairs, driving, lifting, exercising, or traveling.  Talk to your health care provider about resuming sexual activities. Resumption of sexual activities is dependent upon your risk of infection, your rate of healing, and your comfort and desire to resume sexual activity.  Try to have someone help you with your household activities and your newborn for at least a few days after you leave the hospital.  Rest as much as possible. Try to rest or take a nap  when your newborn is sleeping.  Increase your activities gradually.  Keep all of your scheduled postpartum appointments. It is very important to keep your scheduled follow-up appointments. At these appointments, your health care provider will be checking to make sure that you are healing physically and emotionally. SEEK MEDICAL CARE IF:   You are passing large clots from your vagina. Save any clots to show your health care provider.  You have a foul smelling discharge from your vagina.  You have trouble urinating.  You are urinating frequently.  You have pain when you urinate.  You have a change in your bowel movements.  You have increasing redness, pain, or swelling near your vaginal incision (episiotomy) or vaginal tear.  You have pus draining from your episiotomy or vaginal tear.  Your episiotomy or vaginal tear is separating.  You have painful, hard, or reddened breasts.  You have a severe headache.  You have blurred vision or see spots.  You feel sad or depressed.  You have thoughts of hurting yourself or your newborn.  You have questions about your care, the care of your newborn, or medicines.  You are dizzy or light-headed.  You have a rash.  You have nausea or vomiting.  You were breastfeeding and have not had a menstrual period within 12 weeks after you stopped breastfeeding.  You are not breastfeeding and have not had a menstrual period by the 12th week after delivery.  You have a fever. SEEK IMMEDIATE MEDICAL CARE IF:   You have persistent pain.  You have chest pain.  You have shortness of breath.  You faint.  You   have leg pain.  You have stomach pain.  Your vaginal bleeding saturates two or more sanitary pads in 1 hour.   This information is not intended to replace advice given to you by your health care provider. Make sure you discuss any questions you have with your health care provider.   Document Released: 01/24/2000 Document Revised:  10/17/2014 Document Reviewed: 09/23/2011 Elsevier Interactive Patient Education 2016 Elsevier Inc.  

## 2015-07-15 ENCOUNTER — Telehealth: Payer: Self-pay | Admitting: *Deleted

## 2015-07-15 NOTE — Telephone Encounter (Signed)
Called patient at Dr. Feliz BeamSnider's request to give her an appointment next week in clinic.  Patient accepted appointment 6/15, 2:45.  Patient asked to have Lennox LaityJodi call her today at 2:30 for urgent need (she will be home and available at that time), asked to schedule appointment with her tomorrow around 1011.  Appointment made, Lennox LaityJodi notified.  She will call patient at 2:30 as requested. Andree CossHowell, Avaree Gilberti M, RN

## 2015-07-16 ENCOUNTER — Ambulatory Visit: Payer: Medicaid Other | Admitting: *Deleted

## 2015-07-16 ENCOUNTER — Telehealth: Payer: Self-pay | Admitting: *Deleted

## 2015-07-16 NOTE — Telephone Encounter (Signed)
Left message asking patient to reschedule, as her provider was unfortunately unavailable today. Catherine Simmons, [name], RN

## 2015-07-25 ENCOUNTER — Ambulatory Visit: Payer: Medicaid Other | Admitting: *Deleted

## 2015-07-25 ENCOUNTER — Encounter: Payer: Self-pay | Admitting: Internal Medicine

## 2015-07-25 ENCOUNTER — Ambulatory Visit (INDEPENDENT_AMBULATORY_CARE_PROVIDER_SITE_OTHER): Payer: Medicaid Other | Admitting: Internal Medicine

## 2015-07-25 VITALS — BP 132/89 | HR 84 | Temp 98.7°F | Wt 222.0 lb

## 2015-07-25 DIAGNOSIS — O9A313 Physical abuse complicating pregnancy, third trimester: Secondary | ICD-10-CM

## 2015-07-25 DIAGNOSIS — F329 Major depressive disorder, single episode, unspecified: Secondary | ICD-10-CM

## 2015-07-25 DIAGNOSIS — I10 Essential (primary) hypertension: Secondary | ICD-10-CM

## 2015-07-25 DIAGNOSIS — B20 Human immunodeficiency virus [HIV] disease: Secondary | ICD-10-CM | POA: Diagnosis not present

## 2015-07-25 DIAGNOSIS — IMO0002 Reserved for concepts with insufficient information to code with codable children: Secondary | ICD-10-CM

## 2015-07-25 DIAGNOSIS — O99343 Other mental disorders complicating pregnancy, third trimester: Principal | ICD-10-CM

## 2015-07-25 NOTE — BH Specialist Note (Signed)
Counselor met with Catherine Simmons in the exam room today for a warm hand off.  Patient was oriented times four with good affect and dress.  Patient was alert and talkative.  Patient shared that she just gave birth to a baby boy recently and felt a little overwhelmed by fatigue.  Counselor provided support and encouragement accordingly for patient.  Patient indicated that she would like to meet with counselor to discuss a few things going on in her life.  Counselor recommended that patient make an appointment when she checked out today.  Rolena Infante, MA, LPC [facility]
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
## 2015-08-22 ENCOUNTER — Encounter (HOSPITAL_COMMUNITY): Payer: Self-pay

## 2015-08-22 ENCOUNTER — Emergency Department (HOSPITAL_COMMUNITY): Payer: Medicaid Other

## 2015-08-22 ENCOUNTER — Emergency Department (HOSPITAL_COMMUNITY)
Admission: EM | Admit: 2015-08-22 | Discharge: 2015-08-22 | Disposition: A | Discharge status: Home / Self Care | Payer: Medicaid Other | Attending: Emergency Medicine | Admitting: Emergency Medicine

## 2015-08-22 DIAGNOSIS — K649 Unspecified hemorrhoids: Secondary | ICD-10-CM

## 2015-08-22 DIAGNOSIS — F17210 Nicotine dependence, cigarettes, uncomplicated: Secondary | ICD-10-CM | POA: Diagnosis not present

## 2015-08-22 DIAGNOSIS — B20 Human immunodeficiency virus [HIV] disease: Secondary | ICD-10-CM | POA: Insufficient documentation

## 2015-08-22 DIAGNOSIS — K6289 Other specified diseases of anus and rectum: Secondary | ICD-10-CM | POA: Diagnosis present

## 2015-08-22 DIAGNOSIS — I10 Essential (primary) hypertension: Secondary | ICD-10-CM | POA: Diagnosis not present

## 2015-08-22 LAB — BASIC METABOLIC PANEL
Anion gap: 5 (ref 5–15)
BUN: 12 mg/dL (ref 6–20)
CHLORIDE: 109 mmol/L (ref 101–111)
CO2: 25 mmol/L (ref 22–32)
CREATININE: 0.81 mg/dL (ref 0.44–1.00)
Calcium: 8.7 mg/dL — ABNORMAL LOW (ref 8.9–10.3)
GFR calc Af Amer: >60 mL/min (ref >60)
GFR calc non Af Amer: >60 mL/min (ref >60)
GLUCOSE: 99 mg/dL (ref 65–99)
POTASSIUM: 3.9 mmol/L (ref 3.5–5.1)
SODIUM: 139 mmol/L (ref 135–145)

## 2015-08-22 LAB — CBC WITH DIFFERENTIAL/PLATELET
BASOS PCT: 0 %
Basophils Absolute: 0 10*3/uL (ref 0.0–0.1)
EOS ABS: 0.1 10*3/uL (ref 0.0–0.7)
EOS PCT: 1 %
HCT: 30.9 % — ABNORMAL LOW (ref 36.0–46.0)
HEMOGLOBIN: 9.9 g/dL — AB (ref 12.0–15.0)
LYMPHS PCT: 36 %
Lymphs Abs: 2.7 10*3/uL (ref 0.7–4.0)
MCH: 23.6 pg — AB (ref 26.0–34.0)
MCHC: 32 g/dL (ref 30.0–36.0)
MCV: 73.7 fL — AB (ref 78.0–100.0)
MONO ABS: 0.5 10*3/uL (ref 0.1–1.0)
Monocytes Relative: 6 %
NEUTROS ABS: 4.2 10*3/uL (ref 1.7–7.7)
Neutrophils Relative %: 57 %
Platelets: 293 10*3/uL (ref 150–400)
RBC: 4.19 MIL/uL (ref 3.87–5.11)
RDW: 14.8 % (ref 11.5–15.5)
WBC: 7.5 10*3/uL (ref 4.0–10.5)

## 2015-08-22 LAB — I-STAT BETA HCG BLOOD, ED (MC, WL, AP ONLY)

## 2015-08-22 IMAGING — CT CT PELVIS W/ CM
2 of 3 series · 12 of 46 positions shown, 14 images · IV contrast (iopamidol)
Comparison: [DATE] CT scan

CLINICAL DATA: Perirectal  pain, query abscess.

EXAM:
CT PELVIS WITH CONTRAST
TECHNIQUE: Multidetector CT imaging of the pelvis was performed using the
standard protocol following the bolus administration of intravenous
contrast.
CONTRAST:  100mL [21] IOPAMIDOL ([21]) INJECTION 61%

[Series 201: routine, idose (2) · axial · 0.98mm/px · z∈[-736,-471]mm · 9 of 61 slices shown, 11 images]
[im 4/61  soft-tissue]
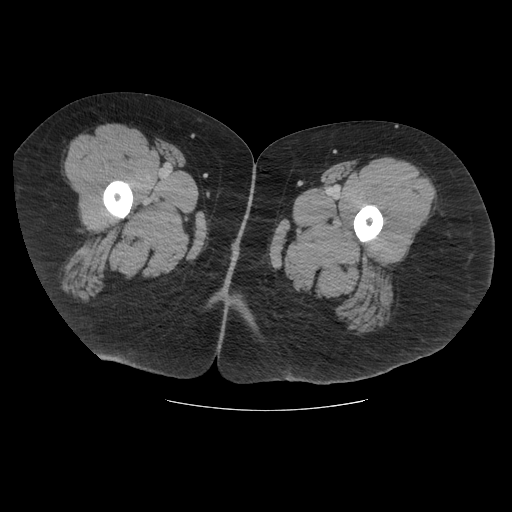
[im 4/61  bone]
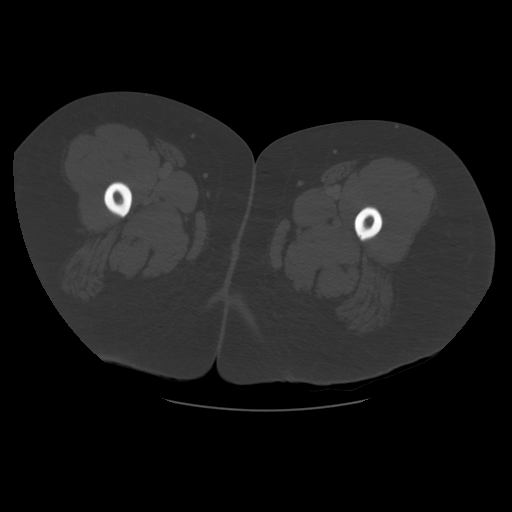
[im 12/61  soft-tissue]
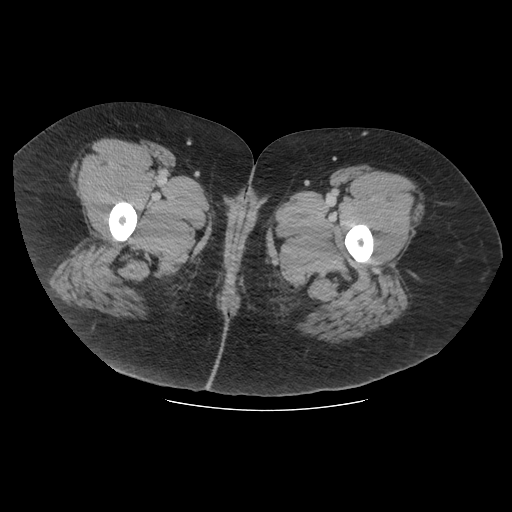
[im 18/61  soft-tissue]
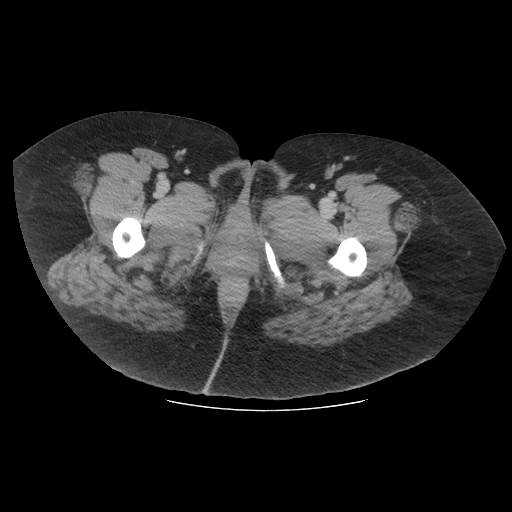
[im 24/61  soft-tissue]
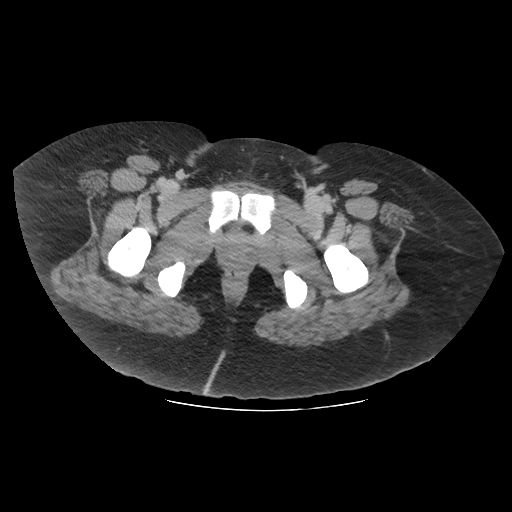
[im 31/61  soft-tissue]
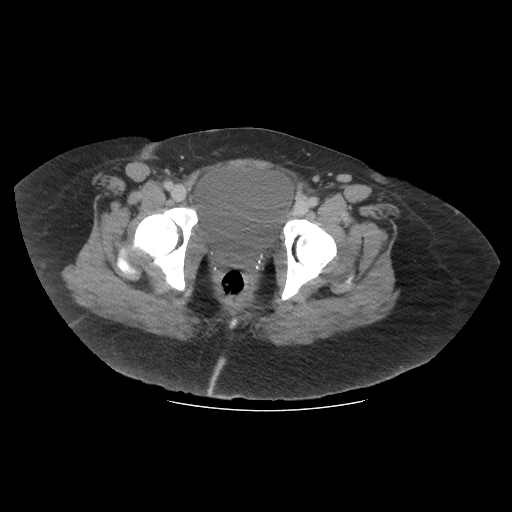
[im 37/61  soft-tissue]
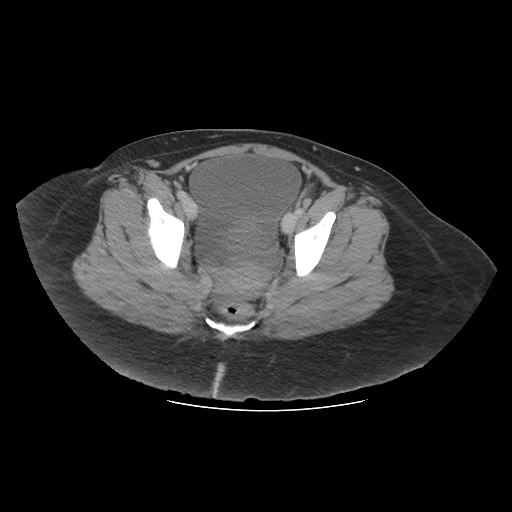
[im 43/61  soft-tissue]
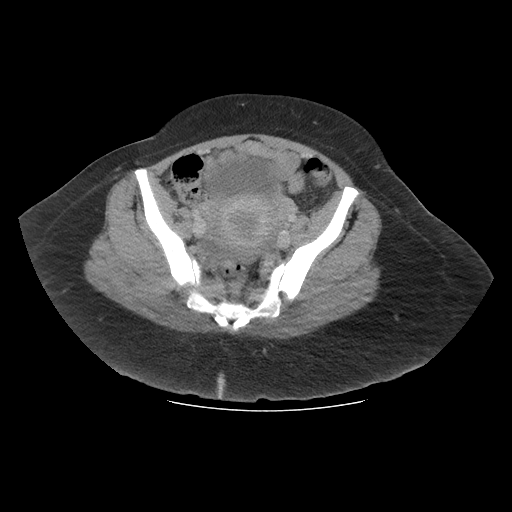
[im 51/61  soft-tissue]
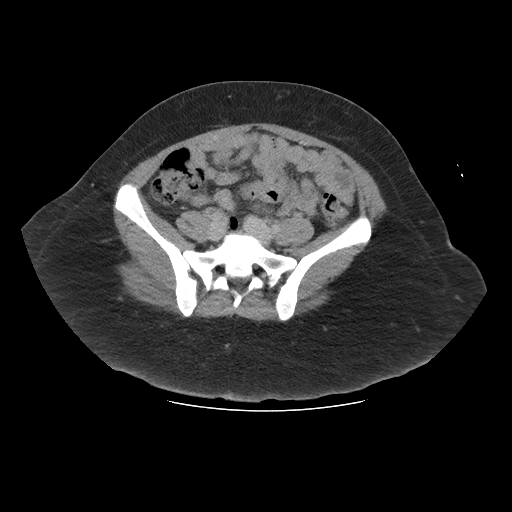
[im 57/61  soft-tissue]
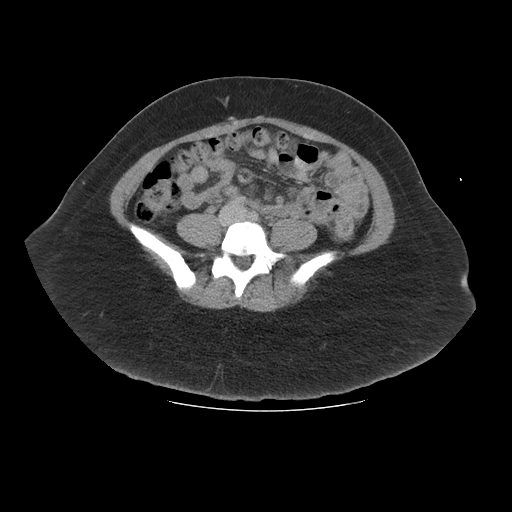
[im 57/61  bone]
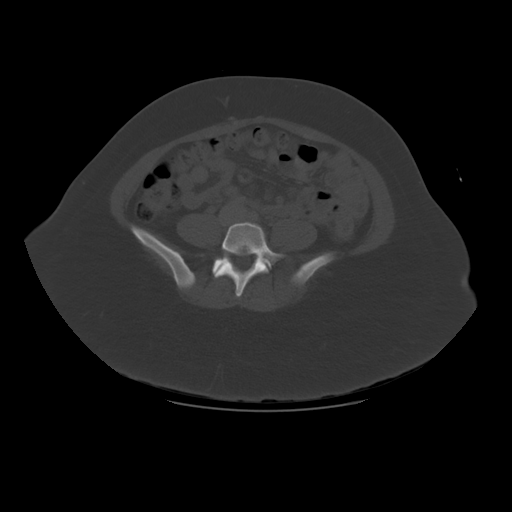

[Series 203: coronals, idose (2) · coronal · 0.45mm/px · 3 of 133 slices shown]
[im 45/133  soft-tissue]
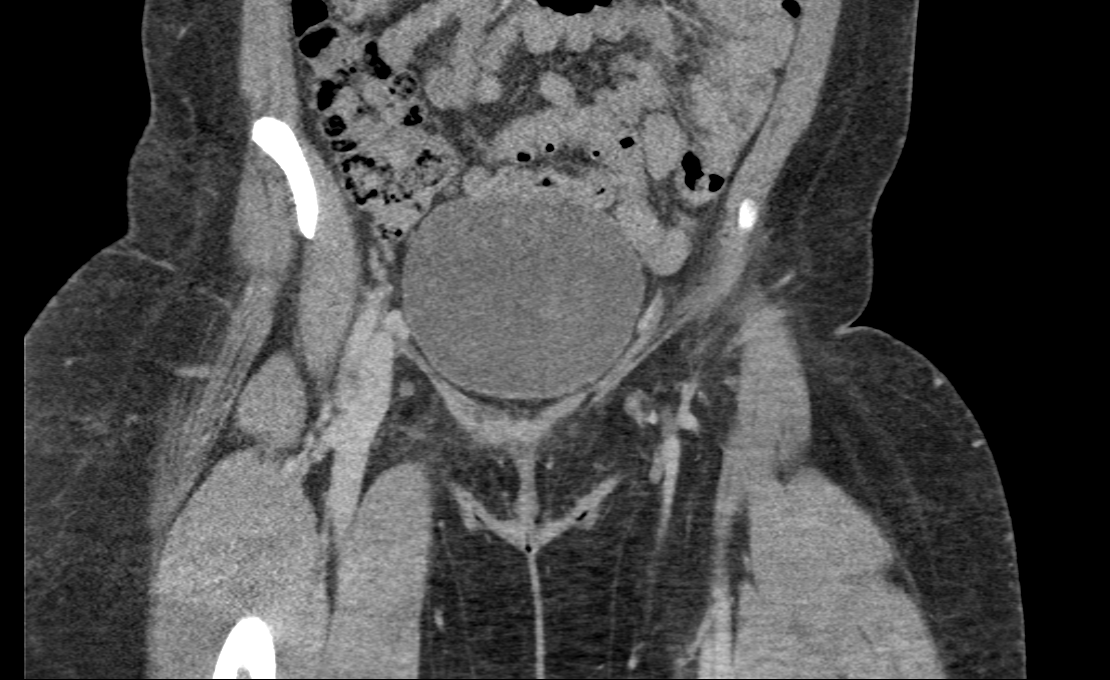
[im 59/133  soft-tissue]
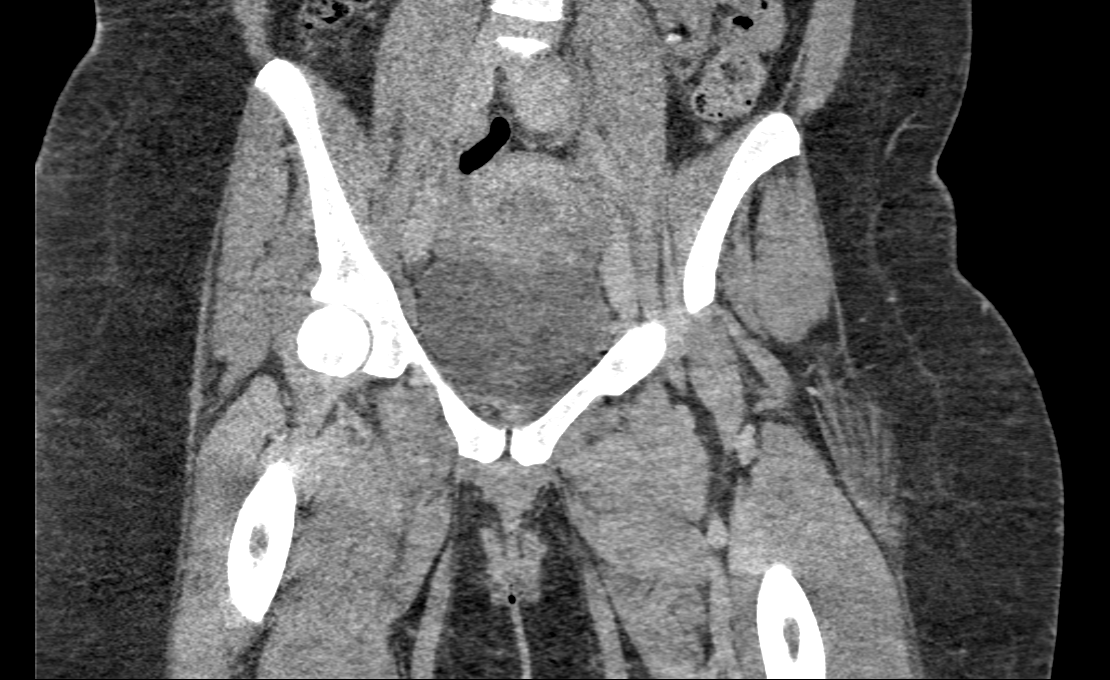
[im 74/133  soft-tissue]
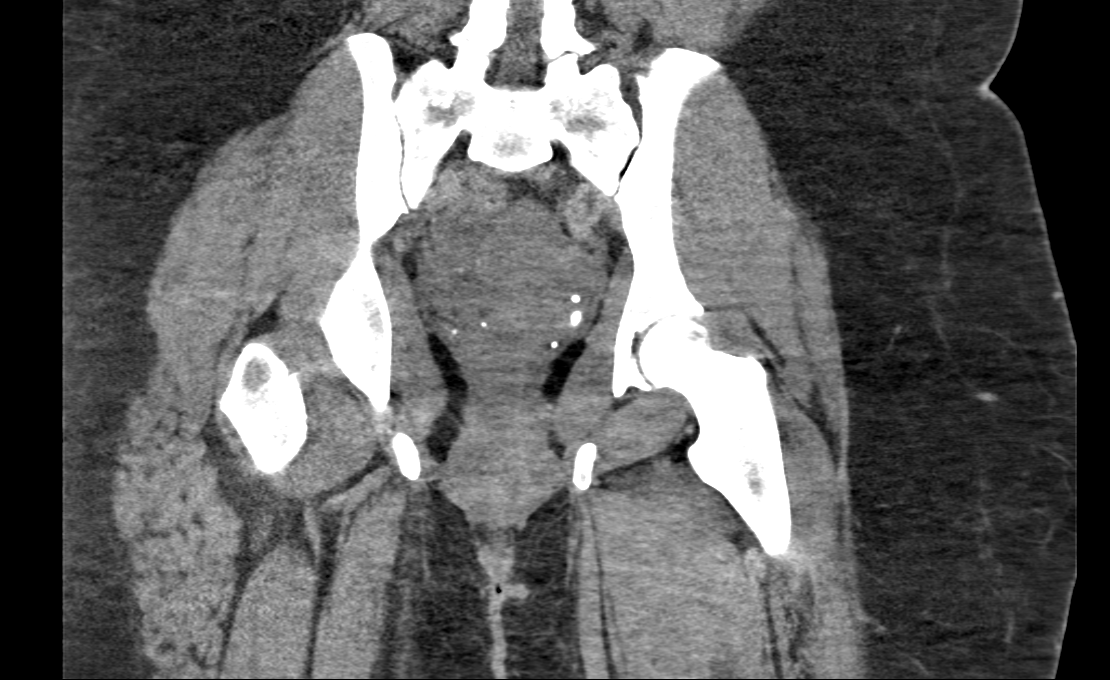

[12 of 46 positions shown; findings below may reference images not displayed]

FINDINGS: Lower urinary Tract: Unremarkable

Bowel: There is some free fluid in the cul-de-sac but I do not see a
definite perirectal abscess. Ischiorectal fossa normal bilaterally.

Vascular/Lymphatic: No pathologic pelvic adenopathy identified.

Reproductive: Hypodensity probably reflecting endometrium measures
up to 1.7 cm centrally in the uterus. No obvious adnexal contour
abnormality.

Other: Small but abnormal amount of free pelvic fluid.

Musculoskeletal: Unremarkable
IMPRESSION: 1. No perirectal abscess is evident. However, there is free pelvic
fluid in the cul-de-sac which is nonspecific.
2. Mildly thickened endometrium at 1.7 cm.

## 2015-08-22 MED ORDER — ONDANSETRON HCL 4 MG/2ML IJ SOLN
4.0000 mg | Freq: Once | INTRAMUSCULAR | Status: AC
  Administered 2015-08-22: 4 mg via INTRAVENOUS
  Filled 2015-08-22: qty 2

## 2015-08-22 MED ORDER — DIPHENHYDRAMINE HCL 50 MG/ML IJ SOLN
12.5000 mg | Freq: Once | INTRAMUSCULAR | Status: AC
  Administered 2015-08-22: 12.5 mg via INTRAVENOUS
  Filled 2015-08-22: qty 1

## 2015-08-22 MED ORDER — OXYCODONE-ACETAMINOPHEN 5-325 MG PO TABS: 1.0000 | ORAL_TABLET | Freq: Four times a day (QID) | ORAL | Status: DC | PRN

## 2015-08-22 MED ORDER — HYDROMORPHONE HCL 1 MG/ML IJ SOLN
1.0000 mg | Freq: Once | INTRAMUSCULAR | Status: AC
  Administered 2015-08-22: 1 mg via INTRAVENOUS
  Filled 2015-08-22: qty 1

## 2015-08-22 MED ORDER — SODIUM CHLORIDE 0.9 % IV BOLUS (SEPSIS)
1000.0000 mL | Freq: Once | INTRAVENOUS | Status: AC
  Administered 2015-08-22: 1000 mL via INTRAVENOUS

## 2015-08-22 MED ORDER — IOPAMIDOL (ISOVUE-300) INJECTION 61%
INTRAVENOUS | Status: AC
  Administered 2015-08-22: 100 mL
  Filled 2015-08-22: qty 100

## 2015-08-22 MED ORDER — MORPHINE SULFATE (PF) 4 MG/ML IV SOLN
4.0000 mg | Freq: Once | INTRAVENOUS | Status: AC
  Administered 2015-08-22: 4 mg via INTRAVENOUS
  Filled 2015-08-22: qty 1

## 2015-08-22 NOTE — Discharge Instructions (Signed)
Dr. Magnus IvanBlackman is aware of your mass. Please follow-up with his office tomorrow morning. Hemorrhoids Hemorrhoids are swollen veins around the rectum or anus. There are two types of hemorrhoids:   Internal hemorrhoids. These occur in the veins just inside the rectum. They may poke through to the outside and become irritated and painful.  External hemorrhoids. These occur in the veins outside the anus and can be felt as a painful swelling or hard lump near the anus. CAUSES 1. Pregnancy.  2. Obesity.  3. Constipation or diarrhea.  4. Straining to have a bowel movement.  5. Sitting for long periods on the toilet. 6. Heavy lifting or other activity that caused you to strain. 7. Anal intercourse. SYMPTOMS   Pain.   Anal itching or irritation.   Rectal bleeding.   Fecal leakage.   Anal swelling.   One or more lumps around the anus.  DIAGNOSIS  Your caregiver may be able to diagnose hemorrhoids by visual examination. Other examinations or tests that may be performed include:   Examination of the rectal area with a gloved hand (digital rectal exam).   Examination of anal canal using a small tube (scope).   A blood test if you have lost a significant amount of blood.  A test to look inside the colon (sigmoidoscopy or colonoscopy). TREATMENT Most hemorrhoids can be treated at home. However, if symptoms do not seem to be getting better or if you have a lot of rectal bleeding, your caregiver may perform a procedure to help make the hemorrhoids get smaller or remove them completely. Possible treatments include:   Placing a rubber band at the base of the hemorrhoid to cut off the circulation (rubber band ligation).   Injecting a chemical to shrink the hemorrhoid (sclerotherapy).   Using a tool to burn the hemorrhoid (infrared light therapy).   Surgically removing the hemorrhoid (hemorrhoidectomy).   Stapling the hemorrhoid to block blood flow to the tissue (hemorrhoid  stapling).  HOME CARE INSTRUCTIONS   Eat foods with fiber, such as whole grains, beans, nuts, fruits, and vegetables. Ask your doctor about taking products with added fiber in them (fibersupplements).  Increase fluid intake. Drink enough water and fluids to keep your urine clear or pale yellow.   Exercise regularly.   Go to the bathroom when you have the urge to have a bowel movement. Do not wait.   Avoid straining to have bowel movements.   Keep the anal area dry and clean. Use wet toilet paper or moist towelettes after a bowel movement.   Medicated creams and suppositories may be used or applied as directed.   Only take over-the-counter or prescription medicines as directed by your caregiver.   Take warm sitz baths for 15-20 minutes, 3-4 times a day to ease pain and discomfort.   Place ice packs on the hemorrhoids if they are tender and swollen. Using ice packs between sitz baths may be helpful.   Put ice in a plastic bag.   Place a towel between your skin and the bag.   Leave the ice on for 15-20 minutes, 3-4 times a day.   Do not use a donut-shaped pillow or sit on the toilet for long periods. This increases blood pooling and pain.  SEEK MEDICAL CARE IF:  You have increasing pain and swelling that is not controlled by treatment or medicine.  You have uncontrolled bleeding.  You have difficulty or you are unable to have a bowel movement.  You have pain or  inflammation outside the area of the hemorrhoids. MAKE SURE YOU:  Understand these instructions.  Will watch your condition.  Will get help right away if you are not doing well or get worse.   This information is not intended to replace advice given to you by your health care provider. Make sure you discuss any questions you have with your health care provider.   Document Released: 01/24/2000 Document Revised: 01/13/2012 Document Reviewed: 12/01/2011 Elsevier Interactive Patient Education 2016  [employer]. How to Take a [employer] A sitz bath is a warm water bath that is taken while you are sitting down. The water should only come up to your hips and should cover your buttocks. Your health care provider may recommend a sitz bath to help you:   Clean the lower part of your body, including your genital area.  With itching.  With pain.  With sore muscles or muscles that tighten or spasm. HOW TO TAKE A SITZ BATH Take 3-4 sitz baths per day or as told by your health care provider. 8. Partially fill a bathtub with warm water. You will only need the water to be deep enough to cover your hips and buttocks when you are sitting in it. 9. If your health care provider told you to put medicine in the water, follow the directions exactly. 10. Sit in the water and open the tub drain a little. 11. Turn on the warm water again to keep the tub at the correct level. Keep the water running constantly. 12. Soak in the water for 15-20 minutes or as told by your health care provider. 13. After the sitz bath, pat the affected area dry first. Do not rub it. 14. Be careful when you stand up after the sitz bath because you may feel dizzy. SEEK MEDICAL CARE IF:  Your symptoms get worse. Do not continue with sitz baths if your symptoms get worse.  You have new symptoms. Do not continue with sitz baths until you talk with your health care provider.   This information is not intended to replace advice given to you by your health care provider. Make sure you discuss any questions you have with your health care provider.   Document Released: 10/19/2003 Document Revised: 06/12/2014 Document Reviewed: 01/24/2014 Elsevier Interactive Patient Education [employer]
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
## 2015-08-22 NOTE — ED Notes (Addendum)
Pt from home via pov with complaint of rectal pain and states "something is coming out of my butt." Upon assessment this RN viewed a mass protruding from the pt's rectum that looks to be prolapsed. Pt unable to sit and tearful in triage. Pt denies placing anything in rectum, lmp x 3 weeks ago. Pt alert and oriented x 4.

## 2015-08-22 NOTE — ED Notes (Signed)
Pt complains of itching

## 2015-08-22 NOTE — ED Notes (Signed)
MD at bedside. 

## 2015-08-23 ENCOUNTER — Encounter (HOSPITAL_COMMUNITY): Payer: Self-pay | Admitting: *Deleted

## 2015-08-23 ENCOUNTER — Emergency Department (HOSPITAL_COMMUNITY)
Admission: EM | Admit: 2015-08-23 | Discharge: 2015-08-23 | Disposition: A | Discharge status: Home / Self Care | Payer: Medicaid Other | Attending: Emergency Medicine | Admitting: Emergency Medicine

## 2015-08-23 DIAGNOSIS — Z79899 Other long term (current) drug therapy: Secondary | ICD-10-CM | POA: Insufficient documentation

## 2015-08-23 DIAGNOSIS — F17210 Nicotine dependence, cigarettes, uncomplicated: Secondary | ICD-10-CM | POA: Insufficient documentation

## 2015-08-23 DIAGNOSIS — K649 Unspecified hemorrhoids: Secondary | ICD-10-CM | POA: Diagnosis not present

## 2015-08-23 DIAGNOSIS — K6289 Other specified diseases of anus and rectum: Secondary | ICD-10-CM | POA: Diagnosis present

## 2015-08-23 DIAGNOSIS — I10 Essential (primary) hypertension: Secondary | ICD-10-CM | POA: Diagnosis not present

## 2015-08-23 MED ORDER — HYDROCORTISONE 2.5 % RE CREA: TOPICAL_CREAM | RECTAL | Status: DC

## 2015-08-23 MED ORDER — OXYCODONE-ACETAMINOPHEN 5-325 MG PO TABS: 1.0000 | ORAL_TABLET | Freq: Four times a day (QID) | ORAL | Status: DC | PRN

## 2015-08-23 MED ORDER — OXYCODONE-ACETAMINOPHEN 5-325 MG PO TABS
1.0000 | ORAL_TABLET | Freq: Once | ORAL | Status: AC
  Administered 2015-08-23: 1 via ORAL
  Filled 2015-08-23: qty 1

## 2015-08-23 MED ORDER — DOCUSATE SODIUM 100 MG PO CAPS: 100.0000 mg | ORAL_CAPSULE | Freq: Two times a day (BID) | ORAL | Status: DC

## 2015-08-23 NOTE — Discharge Instructions (Signed)
Take 2 capfuls of Miralax daily until you see the surgeon on Monday. Colace is your stool softener. It is very important that you keep this scheduled appointment. Take pain medication only as needed for severe pain - This can make you very drowsy - please do not drink alcohol, operate heavy machinery or drive on this medication. Apply ice to the affected area for additional relief. Return to ER for new or worsening symptoms, any additional concerns.

## 2015-08-23 NOTE — ED Notes (Signed)
Pt reports rectal pain and issues with hemorrhoids. Was seen in ED yesterday for same. Now states increased pain today.

## 2015-08-23 NOTE — ED Notes (Signed)
Pt reports being evaluated here last night for rectal pain with dx of hemrrhoids, pt called Magnus IvanBlackman, MD for appt & cannot be seen until Monday, pt requesting pain medication, pt denies rectal bleeding, pt A&O x4

## 2015-08-23 NOTE — ED Notes (Signed)
Pt instructed to follow up with surgeon.

## 2015-08-23 NOTE — ED Provider Notes (Signed)
CSN: [id number]     Arrival date & time 08/23/15  1200 History  By signing my name below, I, Phillis Haggis, attest that this documentation has been prepared under the direction and in the presence of [facility], PA-C. Electronically Signed: Phillis Haggis, ED Scribe. 08/23/2015. 12:27 PM.   Chief Complaint  Patient presents with  . Pain   The history is provided by the patient. No language interpreter was used.  HPI Comments: Catherine ANASTAS is a 30 y.o. female with a hx of HTN, HIV, and sickle cell trait who presents to the Emergency Department complaining of gradually worsening rectal pain onset 2 days ago. Pt reports that she was seen in the ED yesterday for the same and diagnosed with hemorrhoids. She was referred to Dr. Rayburn Ma to follow up today. She states that she went to the office and was told that she could not be seen until Monday. She reports that she is almost out of the oxycodone she was prescribed yesterday (8 pills given). Her last dose was at 4 AM this morning. She states that she has not had a BM today, but feels like she needs to have one. She denies fever, chills, rectal bleeding, nausea, vomiting, new rash or wound. Symptoms unchanged since yesterday.   Past Medical History  Diagnosis Date  . Cholecystitis 07/16/10  . Hypertension   . HIV (human immunodeficiency virus infection) (HCC)   . Genital warts 2004  . Sickle cell trait (HCC)   . Hx of pelvic inflammatory disease 08/31/2013    And hx of multiple STDs    Past Surgical History  Procedure Laterality Date  . Cholecystectomy    . Wisdom tooth extraction     Family History  Problem Relation Age of Onset  . Cancer Mother   . Diabetes Maternal Aunt    Social History  Substance Use Topics  . Smoking status: Current Every Day Smoker -- 0.30 packs/day    Types: Cigarettes    Start date: 03/11/2012  . Smokeless tobacco: Never Used  . Alcohol Use: Yes     Comment: occ   OB History    Gravida Para Term  Preterm AB TAB SAB Ectopic Multiple Living   4 4 4  0     0 4     Review of Systems  Constitutional: Negative for fever and chills.  Gastrointestinal: Positive for rectal pain. Negative for nausea, vomiting and abdominal pain.   Allergies  Stadol  Home Medications   Prior to Admission medications   Medication Sig Start Date End Date Taking? Authorizing Provider  amLODipine (NORVASC) 5 MG tablet Take 1 tablet (5 mg total) by mouth daily. 07/08/15   Weakley N Rumley, DO  darunavir-cobicistat (PREZCOBIX) 800-150 MG tablet Take 1 tablet by mouth at bedtime. Swallow whole. Do NOT crush, break or chew tablets. Take with food.    Historical Provider, MD  docusate sodium (COLACE) 100 MG capsule Take 1 capsule (100 mg total) by mouth every 12 (twelve) hours. 08/23/15   Chase Picket Ward, PA-C  Elastic Bandages & Supports (COMFORT FIT MATERNITY SUPP LG) MISC 1 Device by Does not apply route daily. 06/12/15   Wilmer Floor Leftwich-Kirby, CNM  emtricitabine-tenofovir (TRUVADA) 200-300 MG tablet Take 1 tablet by mouth at bedtime.    Historical Provider, MD  hydrochlorothiazide (HYDRODIURIL) 25 MG tablet Take 1 tablet (25 mg total) by mouth daily. 07/08/15   [place] N Rumley, DO  hydrocortisone (ANUSOL-HC) 2.5 % rectal cream Apply rectally 2 times  daily 08/23/15   [facility] Ward, PA-C  ibuprofen (ADVIL,MOTRIN) 400 MG tablet Take 1 tablet (400 mg total) by mouth every 6 (six) hours. 07/08/15   [place] N Rumley, DO  ondansetron (ZOFRAN-ODT) 8 MG disintegrating tablet DISSOLVE 1 TABLET(8 MG) ON THE TONGUE EVERY 8 HOURS AS NEEDED FOR NAUSEA OR VOMITING 06/26/15   Judyann Munson, MD  oxyCODONE-acetaminophen (PERCOCET/ROXICET) 5-325 MG tablet Take 1-2 tablets by mouth every 6 (six) hours as needed for severe pain. 08/23/15   Jaime Pilcher Ward, PA-C  pantoprazole (PROTONIX) 40 MG tablet Take 1 tablet (40 mg total) by mouth daily. 06/02/15   Adam Phenix, MD  ranitidine (ZANTAC) 150 MG tablet Take 1 tablet (150 mg total)  by mouth 2 (two) times daily. 05/04/15   Michael J [place], MD  senna-docusate (SENOKOT-S) 8.6-50 MG tablet Take 2 tablets by mouth daily. 07/08/15   Euharlee N Rumley, DO   BP 144/70 mmHg  Pulse 82  Temp(Src) 98.4 F (36.9 C) (Oral)  Resp 18  Ht  (1.575 m)  Wt 107.077 kg  BMI 43.17 kg/m2  SpO2 99%  LMP 08/02/2015  Breastfeeding? No Physical Exam  Constitutional: She is oriented to person, place, and time. She appears well-developed and well-nourished.  HENT:  Head: Normocephalic and atraumatic.  Neck: Neck supple.  Cardiovascular: Normal rate and regular rhythm.   No murmur heard. Pulmonary/Chest: Effort normal and breath sounds normal. No respiratory distress.  Abdominal: Soft. She exhibits no distension. There is no tenderness.  Genitourinary:  Chaperone present for exam. Tender hard mass to right anus which appears unchanged compared to image in chart from yesterday's visit. No drainage. No surrounding erythema.   Musculoskeletal: Normal range of motion.  Neurological: She is alert and oriented to person, place, and time.  Skin: Skin is warm and dry.  Psychiatric: She has a normal mood and affect. Her behavior is normal.  Nursing note and vitals reviewed.   ED Course  Procedures (including critical care time) DIAGNOSTIC STUDIES: Oxygen Saturation is 100% on RA, normal by my interpretation.    COORDINATION OF CARE: 12:27 PM-Discussed treatment plan which includes call to Dr. Rayburn Ma with pt at bedside and pt agreed to plan.    Labs Review Labs Reviewed - No data to display  Imaging Review Ct Pelvis W Contrast  08/22/2015  CLINICAL DATA:  Perirectal  pain, query abscess. EXAM: CT PELVIS WITH CONTRAST TECHNIQUE: Multidetector CT imaging of the pelvis was performed using the standard protocol following the bolus administration of intravenous contrast. CONTRAST:  ISOVUE-300 IOPAMIDOL (ISOVUE-300) INJECTION 61% COMPARISON:  07/28/2011 CT scan FINDINGS: Lower  urinary Tract: Unremarkable Bowel: There is some free fluid in the cul-de-sac but I do not see a definite perirectal abscess. Ischiorectal fossa normal bilaterally. Vascular/Lymphatic: No pathologic pelvic adenopathy identified. Reproductive: Hypodensity probably reflecting endometrium measures up to 1.7 cm centrally in the uterus. No obvious adnexal contour abnormality. Other: Small but abnormal amount of free pelvic fluid. Musculoskeletal: Unremarkable IMPRESSION: 1. No perirectal abscess is evident. However, there is free pelvic fluid in the cul-de-sac which is nonspecific. 2. Mildly thickened endometrium at 1.7 cm. Electronically Signed   By: Gaylyn Rong M.D.   On: 08/22/2015 18:23   I have personally reviewed and evaluated these images and lab results as part of my medical decision-making.   EKG Interpretation None      MDM   Final diagnoses:  Hemorrhoids, unspecified hemorrhoid type   Catherine Simmons presents to the ED for  unchanged rectal pain. She was seen yesterday for the same and chart from that visit was reviewed. CT was obtained which showed no abscess. General surgery was consulted who believes mass is a hemorrhoid. She was told to follow-up with Dr. Rayburn MaBlackmon in the office today. [facility] and was told that they do not have any available appointments today. She scheduled an appointment for Monday, then came to the emergency department because she was in a great deal of pain. She only has 4 of her pain pills left from yesterday's visit (8 pills were given). On exam, she appears very uncomfortable. Always laying on her stomach and not putting any pressure on her buttocks area. Mass appears unchanged compared to image in the chart. I consult to general surgery to review these images and asked for further recommendations as it is the weekend and patient cannot be seen until Monday. I informed them that she is having difficulty with bowel movements as well.  Surgery reviewed chart and images. Recommendation to start steroid rectal cream as well as MiraLAX and Colace. Appointment scheduled for Monday. Rx for hydrocortisone cream and Colace given. I also gave her another short course of pain pills to get through the weekend. Patient is aware of follow-up care and reasons to return to the immediately. Home care instructions including sitz baths and ice discussed. All questions answered and patient will be discharged in stable condition.  I personally performed the services described in this documentation, which was scribed in my presence. The recorded information has been reviewed and is accurate.  [facility] LLC[facility]ime Pilcher Ward, PA-C 08/23/15 1355  Derwood KaplanAnkit Nanavati, MD 08/25/15 [id number]

## 2015-09-02 ENCOUNTER — Ambulatory Visit (INDEPENDENT_AMBULATORY_CARE_PROVIDER_SITE_OTHER): Payer: Medicaid Other | Admitting: Advanced Practice Midwife

## 2015-09-02 ENCOUNTER — Encounter: Payer: Self-pay | Admitting: Advanced Practice Midwife

## 2015-09-02 ENCOUNTER — Ambulatory Visit (INDEPENDENT_AMBULATORY_CARE_PROVIDER_SITE_OTHER): Payer: Self-pay | Admitting: Clinical

## 2015-09-02 VITALS — BP 129/82 | HR 80 | Wt 232.9 lb

## 2015-09-02 DIAGNOSIS — Z30013 Encounter for initial prescription of injectable contraceptive: Secondary | ICD-10-CM

## 2015-09-02 DIAGNOSIS — F53 Postpartum depression: Secondary | ICD-10-CM

## 2015-09-02 DIAGNOSIS — F4323 Adjustment disorder with mixed anxiety and depressed mood: Secondary | ICD-10-CM

## 2015-09-02 DIAGNOSIS — B20 Human immunodeficiency virus [HIV] disease: Secondary | ICD-10-CM

## 2015-09-02 DIAGNOSIS — O99345 Other mental disorders complicating the puerperium: Principal | ICD-10-CM

## 2015-09-02 DIAGNOSIS — I10 Essential (primary) hypertension: Secondary | ICD-10-CM

## 2015-09-02 LAB — POCT PREGNANCY, URINE: PREG TEST UR: NEGATIVE

## 2015-09-02 MED ORDER — MEDROXYPROGESTERONE ACETATE 150 MG/ML IM SUSP
150.0000 mg | Freq: Once | INTRAMUSCULAR | Status: AC
  Administered 2015-09-02: 150 mg via INTRAMUSCULAR

## 2015-09-02 MED ORDER — MEDROXYPROGESTERONE ACETATE 150 MG/ML IM SUSP: 150.0000 mg | Freq: Once | INTRAMUSCULAR | Status: DC

## 2015-09-02 NOTE — Patient Instructions (Addendum)
Postpartum Depression and Baby Blues The postpartum period begins right after the birth of a baby. During this time, there is often a great amount of joy and excitement. It is also a time of many changes in the life of the parents. Regardless of how many times a mother gives birth, each child brings new challenges and dynamics to the family. It is not unusual to have feelings of excitement along with confusing shifts in moods, emotions, and thoughts. All mothers are at risk of developing postpartum depression or the "baby blues." These mood changes can occur right after giving birth, or they may occur many months after giving birth. The baby blues or postpartum depression can be mild or severe. Additionally, postpartum depression can go away rather quickly, or it can be a long-term condition.  CAUSES Raised hormone levels and the rapid drop in those levels are thought to be a main cause of postpartum depression and the baby blues. A number of hormones change during and after pregnancy. Estrogen and progesterone usually decrease right after the delivery of your baby. The levels of thyroid hormone and various cortisol steroids also rapidly drop. Other factors that play a role in these mood changes include major life events and genetics.  RISK FACTORS If you have any of the following risks for the baby blues or postpartum depression, know what symptoms to watch out for during the postpartum period. Risk factors that may increase the likelihood of getting the baby blues or postpartum depression include:  Having a personal or family history of depression.   Having depression while being pregnant.   Having premenstrual mood issues or mood issues related to oral contraceptives.  Having a lot of life stress.   Having marital conflict.   Lacking a social support network.   Having a baby with special needs.   Having health problems, such as diabetes.  SIGNS AND SYMPTOMS Symptoms of baby blues  include:  Brief changes in mood, such as going from extreme happiness to sadness.  Decreased concentration.   Difficulty sleeping.   Crying spells, tearfulness.   Irritability.   Anxiety.  Symptoms of postpartum depression typically begin within the first month after giving birth. These symptoms include:  Difficulty sleeping or excessive sleepiness.   Marked weight loss.   Agitation.   Feelings of worthlessness.   Lack of interest in activity or food.  Postpartum psychosis is a very serious condition and can be dangerous. Fortunately, it is rare. Displaying any of the following symptoms is cause for immediate medical attention. Symptoms of postpartum psychosis include:   Hallucinations and delusions.   Bizarre or disorganized behavior.   Confusion or disorientation.  DIAGNOSIS  A diagnosis is made by an evaluation of your symptoms. There are no medical or lab tests that lead to a diagnosis, but there are various questionnaires that a health care provider may use to identify those with the baby blues, postpartum depression, or psychosis. Often, a screening tool called the Edinburgh Postnatal Depression Scale is used to diagnose depression in the postpartum period.  TREATMENT The baby blues usually goes away on its own in 1-2 weeks. Social support is often all that is needed. You will be encouraged to get adequate sleep and rest. Occasionally, you may be given medicines to help you sleep.  Postpartum depression requires treatment because it can last several months or longer if it is not treated. Treatment may include individual or group therapy, medicine, or both to address any social, physiological, and psychological   factors that may play a role in the depression. Regular exercise, a healthy diet, rest, and social support may also be strongly recommended.  Postpartum psychosis is more serious and needs treatment right away. Hospitalization is often needed. HOME CARE  INSTRUCTIONS  Get as much rest as you can. Nap when the baby sleeps.   Exercise regularly. Some women find yoga and walking to be beneficial.   Eat a balanced and nourishing diet.   Do little things that you enjoy. Have a cup of tea, take a bubble bath, read your favorite magazine, or listen to your favorite music.  Avoid alcohol.   Ask for help with household chores, cooking, grocery shopping, or running errands as needed. Do not try to do everything.   Talk to people close to you about how you are feeling. Get support from your partner, family members, friends, or other new moms.  Try to stay positive in how you think. Think about the things you are grateful for.   Do not spend a lot of time alone.   Only take over-the-counter or prescription medicine as directed by your health care provider.  Keep all your postpartum appointments.   Let your health care provider know if you have any concerns.  SEEK MEDICAL CARE IF: You are having a reaction to or problems with your medicine. SEEK IMMEDIATE MEDICAL CARE IF:  You have suicidal feelings.   You think you may harm the baby or someone else. MAKE SURE YOU:  Understand these instructions.  Will watch your condition.  Will get help right away if you are not doing well or get worse.   This information is not intended to replace advice given to you by your health care provider. Make sure you discuss any questions you have with your health care provider.   Document Released: 10/31/2003 Document Revised: 01/31/2013 Document Reviewed: 11/07/2012 Elsevier Interactive Patient Education 2016 Elsevier Inc.   Laparoscopic Tubal Ligation Laparoscopic tubal ligation is a procedure that closes the fallopian tubes at a time other than right after childbirth. When the fallopian tubes are closed, the eggs that are released from the ovaries cannot enter the uterus, and sperm cannot reach the egg. Tubal ligation is also known as  getting your "tubes tied." Tubal ligation is done so you will not be able to get pregnant or have a baby. Although this procedure may be undone (reversed), it should be considered permanent and irreversible. If you want to have future pregnancies, you should not have this procedure. LET [facility] CARE PROVIDER KNOW ABOUT:  Any allergies you have.  All medicines you are taking, including vitamins, herbs, eye drops, creams, and over-the-counter medicines. This includes any use of steroids, either by mouth or in cream form.  Previous problems you or members of your family have had with the use of anesthetics.  Any blood disorders you have.  Previous surgeries you have had.  Any medical conditions you may have.  Possibility of pregnancy, if this applies.  Any past pregnancies. RISKS AND COMPLICATIONS  Infection.  Bleeding.  Injury to surrounding organs.  Side effects from anesthetics.  Failure of the procedure.  Ectopic pregnancy.  Future regret about having the procedure done. BEFORE THE PROCEDURE  Ask your health care provider about:  Changing or stopping your regular medicines. This is especially important if you are taking diabetes medicines or blood thinners.  Taking medicines such as aspirin and ibuprofen. These medicines can thin your blood. Do not take these medicines before  your procedure if your health care provider instructs you not to.  Follow instructions from your health care provider about eating and drinking restrictions.  Plan to have someone take you home after the procedure.  If you go home right after the procedure, plan to have someone with you for 24 hours. PROCEDURE  You will be given one or more of the following:  A medicine that helps you relax (sedative).  A medicine that numbs the area (local anesthetic).  A medicine that makes you fall asleep (general anesthetic).  A medicine that is injected into an area of your body that numbs  everything below the injection site (regional anesthetic).  If you have been given general anesthetic, a tube will be put down your throat to help you breathe.  Two small cuts (incisions) will be made in the lower abdominal area and near the belly button.  Your bladder may be emptied with a small tube (catheter).  Your abdomen will be inflated with a safe gas (carbon dioxide). This will help to give the surgeon room to operate and visualize, and it will help the surgeon to avoid other organs.  A thin, lighted tube (laparoscope) with a camera attached will be inserted into your abdomen through one of the incisions near the belly button. Other small instruments will be inserted through the other abdominal incision.  The fallopian tubes will be tied off or burned (cauterized), or they will be blocked with a clip, ring, or clamp. In many cases, a small portion in the center of each fallopian tube will also be removed.  After the fallopian tubes are blocked, the gas will be released from the abdomen.  The incisions will be closed with stitches (sutures).  A bandage (dressing) will be placed over the incisions. The procedure may vary among health care providers and hospitals. AFTER THE PROCEDURE  Your blood pressure, heart rate, breathing rate, and blood oxygen level will be monitored often until the medicines you were given have worn off.  You will be given pain medicine as needed.  If you had general anesthetic, you may have some mild discomfort in your throat. This is from the breathing tube that was placed in your throat while you were sleeping.  You may experience discomfort in the shoulder area from some trapped air between your liver and your diaphragm. This sensation is normal, and it will slowly go away on its own.  You will have some mild abdominal discomfort for 3--7 days.   This information is not intended to replace advice given to you by your health care provider. Make sure  you discuss any questions you have with your health care provider.   Document Released: 05/04/2000 Document Revised: 06/12/2014 Document Reviewed: 05/09/2011 Elsevier Interactive Patient Education [employer]

## 2015-09-02 NOTE — Progress Notes (Signed)
  ASSESSMENT: Pt currently experiencing  Pt needs to f/u with OB and [facility]. Pt would benefit from psychoeducation and brief therapeutic interventions regarding coping with adjustment.   Stage of Change: contemplative/determination  PLAN: 1. F/U with behavioral health clinician in two weeks 2. Psychiatric Medications: none 3. Behavioral recommendations:   -Practice relaxation breathing exercises, as practiced in office visit, prior to bedtime -Complete sleep checklist at home, to discuss at next visit -Read educational material regarding coping with symptoms of anxiety and depression   SUBJECTIVE: Pt. referred by  Dorathy Kinsman, CNM, for symptoms of anxiety and depression.  Pt. reports the following symptoms/concerns: Pt states that her primary concern today is lack of sleep, and would like some non-medicinal strategies to cope with lack of sleep. Duration of problem: Three months Severity: moderate   OBJECTIVE: Orientation & Cognition: Oriented x3. Thought processes normal and appropriate to situation. Mood: appropriate Affect: appropriate Appearance: appropriate Risk of harm to self or others: no known risk of harm to self or others Substance use: none Assessments administered: PHQ9: 13/ GAD7: 9  Diagnosis: Adjustment disorder with anxious and depressed mood CPT Code: F43.23  -------------------------------------------- Other(s) present in the room: none  Time spent with patient in exam room: 30 minutes 4:50 to 5:20pm Depression screen [facility] 2/9 09/02/2015 07/25/2015 06/11/2015 06/07/2015 04/12/2015  Decreased Interest 2 0 1 0 0  Down, Depressed, Hopeless 2 0 1 1 0  PHQ - 2 Score 4 0 2 1 0  Altered sleeping 2 0 1 - -  Tired, decreased energy 2 0 1 - -  Change in appetite 2 0 1 - -  Feeling bad or failure about yourself  2 0 1 - -  Trouble concentrating 0 0 0 - -  Moving slowly or fidgety/restless 1 0 1 - -  Suicidal thoughts 0 0 0 - -  PHQ-9 Score 13 0 7 - -  Difficult doing  work/chores - - Somewhat difficult - -  Some recent data might be hidden   GAD 7 : Generalized Anxiety Score 09/02/2015  Nervous, Anxious, on Edge 2  Control/stop worrying 2  Worry too much - different things 2  Trouble relaxing 2  Restless 0  Easily annoyed or irritable 1  Afraid - awful might happen 0  Total GAD 7 Score 9

## 2015-09-02 NOTE — Progress Notes (Signed)
Subjective:     Catherine Simmons is a 30 y.o. female who presents for a postpartum visit. She is 8 weeks postpartum following a spontaneous vaginal delivery. I have fully reviewed the prenatal and intrapartum course. The delivery was at 38 gestational weeks. Outcome: spontaneous vaginal delivery. Anesthesia: none. Postpartum course has been complicated by hemorrhoids and depression. No SI/HI. Baby's course has been Uncomplicated. Baby is feeding by bottle - Similac Neosure. Bleeding no bleeding. Bowel function is normal. Bladder function is normal. Patient is not sexually active. Contraception method is planning Depo. Had unplotected IC 5 days ago, but states partner did not ejaculate. Strongly requests Depo today because she states her partner will not use a condom and may not be willing ti wait two weeks to have IC. States she is not in danger from him. Postpartum depression screening: Positive.  The following portions of the patient's history were reviewed and updated as appropriate: allergies, current medications, past family history, past medical history, past social history, past surgical history and problem list.  Review of Systems Pertinent items are noted in HPI.   Objective:    LMP 08/02/2015   General:  alert, cooperative, appears stated age, mild distress and moderately obese. Occasionally tearful.    Breasts:  Declined  Lungs: clear to auscultation bilaterally  Heart:  regular rate and rhythm, S1, S2 normal, no murmur, click, rub or gallop  Abdomen: soft, non-tender; bowel sounds normal; no masses,  no organomegaly   Vulva:  not evaluated  Vagina: not evaluated        Assessment:     Normal postpartum exam. Pap smear not done at today's visit.   CHTN stable on Norvasc, HCTZ. Has refills.  Plan:    1. Contraception: Depo-Provera injections. OK to to give per Dr. Shawnie Pons. Pt informed that she could be pregnant, but too early to test positive. Will not likely interfere w/  established pregnancy. No know dangers to established pregnancy. Pt verbalizes understanding.  2. Follow up in: 2 and 4 weeks for pregnancy tests, then every 12 weeks for Depo..   3. Will have pt talk to [facility] today. 4. F/U w/ [facility] for CHTN. May get Depo shots there.  5. [facility] ED for mental health emergencies.   [place], [place] 09/02/2015 4:17 PM

## 2015-09-02 NOTE — Addendum Note (Signed)
Addended by: Cheree Ditto, Valisha Heslin A on: 09/02/2015 05:37 PM   Modules accepted: Orders

## 2015-09-14 ENCOUNTER — Other Ambulatory Visit: Payer: Self-pay | Admitting: Family Medicine

## 2015-09-16 NOTE — Telephone Encounter (Signed)
[facility].  It looks like ID has been managing HTN recently.  She will need appt with a provider here or to send refills to [facility] clinic.  Erasmo DownerAngela M Bacigalupo, MD, MPH PGY-3,  [facility] 09/16/2015 1:33 PM

## 2015-09-20 NOTE — Telephone Encounter (Signed)
Left voicemail with message from MD, asked that patient either resend request to ID or call back to schedule BP follow up

## 2015-11-06 ENCOUNTER — Encounter: Payer: Self-pay | Admitting: Family Medicine

## 2015-11-06 ENCOUNTER — Ambulatory Visit (INDEPENDENT_AMBULATORY_CARE_PROVIDER_SITE_OTHER): Payer: Medicaid Other | Admitting: Family Medicine

## 2015-11-06 ENCOUNTER — Other Ambulatory Visit (HOSPITAL_COMMUNITY)
Admission: RE | Admit: 2015-11-06 | Discharge: 2015-11-06 | Disposition: A | Discharge status: Home / Self Care | Payer: Medicaid Other | Source: Ambulatory Visit | Attending: Family Medicine | Admitting: Family Medicine

## 2015-11-06 VITALS — BP 148/90 | HR 74 | Temp 98.3°F | Wt 234.8 lb

## 2015-11-06 DIAGNOSIS — N938 Other specified abnormal uterine and vaginal bleeding: Secondary | ICD-10-CM

## 2015-11-06 DIAGNOSIS — Z23 Encounter for immunization: Secondary | ICD-10-CM

## 2015-11-06 DIAGNOSIS — N760 Acute vaginitis: Secondary | ICD-10-CM | POA: Diagnosis present

## 2015-11-06 DIAGNOSIS — Z113 Encounter for screening for infections with a predominantly sexual mode of transmission: Secondary | ICD-10-CM | POA: Insufficient documentation

## 2015-11-06 LAB — CBC
HEMATOCRIT: 33 % — AB (ref 35.0–45.0)
Hemoglobin: 10.2 g/dL — ABNORMAL LOW (ref 11.7–15.5)
MCH: 22.9 pg — ABNORMAL LOW (ref 27.0–33.0)
MCHC: 30.9 g/dL — AB (ref 32.0–36.0)
MCV: 74 fL — ABNORMAL LOW (ref 80.0–100.0)
MPV: 9.5 fL (ref 7.5–12.5)
Platelets: 292 10*3/uL (ref 140–400)
RBC: 4.46 MIL/uL (ref 3.80–5.10)
RDW: 16.9 % — AB (ref 11.0–15.0)
WBC: 6.4 10*3/uL (ref 3.8–10.8)

## 2015-11-06 MED ORDER — FERROUS SULFATE 324 (65 FE) MG PO TBEC: 324.0000 mg | DELAYED_RELEASE_TABLET | Freq: Two times a day (BID) | ORAL | 0 refills | Status: DC

## 2015-11-06 NOTE — Progress Notes (Signed)
   Subjective: CC: heavy vaginal bleeding UJW:JXBJYNWHPI:Catherine Simmons is a 30 y.o. female presenting to clinic today for same day appointment. PCP: Shirlee LatchAngela Bacigalupo, MD Concerns today include:  1. Vaginal bleeding Patient reports that she has been having heavy vaginal bleeding for the last 7 weeks.  She is passing large blood clots.  She reports malaise, dizziness, lightheadedness, palpitations.  She endorses occ SOB.  She reports that she was seen for her postpartum in July and had her Depo shot done at that time.  She reports hot flashes.  She reports that she is going through about 8-10 super pads daily.  She notes that she often soaks through them.  Had normal periods before pregnancy.  Was not on contraceptives at that time.  No personal or family h/o clotting disorders.  Non smoker.  Additionally, she notes she has had loose stools for the last few weeks as well.  Social History Reviewed: non smoker. FamHx and MedHx reviewed.  Please see EMR. Health Maintenance: flu shot  ROS: Per HPI  Objective: Office vital signs reviewed. BP (!) 148/90 (BP Location: Left Arm, Patient Position: Sitting, Cuff Size: Normal)   Pulse 74   Temp 98.3 F (36.8 C) (Oral)   Wt 234 lb 12.8 oz (106.5 kg)   LMP 11/06/2015   SpO2 100%   BMI 42.95 kg/m   Physical Examination:  General: Awake, alert, obese, No acute distress GU: external vaginal tissue normal, cervix retroverted, no punctate lesions on cervix appreciated, minimal bleeding from cervical os, no cervical motion tenderness, no abdominal/ adnexal masses, +vaginal odor MSK: Normal gait and station Skin: dry, intact, no rashes or lesions  Assessment/ Plan: 30 y.o. female   1. Dysfunctional uterine bleeding.  Patient had CT pelvis in 08/2015 for unrelated issue but mildly thickened endometrium appreciated at that time ~1.7cm.  She was approximately 1.5 months postpartum at that time, so unsure if thickening is a normal variant.  DDx at this time:  endometriosis, PCOS (obesity), thyroid dysfunction (loose stools), possible infection?  Will obtain u/s to further evaluate the endometrium. - ferrous sulfate 324 (65 Fe) MG TBEC; Take 1 tablet (324 mg total) by mouth 2 (two) times daily after a meal.  Dispense: 60 tablet; Refill: 0 - TSH - CBC - Cervicovaginal ancillary only, GC/CT and trich - US Transvaginal Non-OB; Future - US Pelvis Complete; Future - hCG, serum, qualitative - Will contact with results - If negative hCG, will plan to start monophasic OCPs +iron - Will likely need a referral to GYN if no improvement in symptoms  2. Encounter for immunization - Flu Vaccine QUAD 36+ mos IM  Follow up with PCP in next 2 weeks.  Precepted with Dr Jennette KettleNeal, who agrees with plan.  Raliegh IpAshly M Divinity Kyler, DO PGY-3, [facility]

## 2015-11-07 ENCOUNTER — Telehealth: Payer: Self-pay | Admitting: Family Medicine

## 2015-11-07 DIAGNOSIS — N938 Other specified abnormal uterine and vaginal bleeding: Secondary | ICD-10-CM

## 2015-11-07 LAB — CERVICOVAGINAL ANCILLARY ONLY
CHLAMYDIA, DNA PROBE: NEGATIVE
NEISSERIA GONORRHEA: NEGATIVE
TRICH (WINDOWPATH): NEGATIVE

## 2015-11-07 LAB — TSH: TSH: 1.66 m[IU]/L

## 2015-11-07 LAB — HCG, SERUM, QUALITATIVE: PREG SERUM: NEGATIVE

## 2015-11-07 MED ORDER — NORGESTREL-ETHINYL ESTRADIOL 0.3-30 MG-MCG PO TABS: 1.0000 | ORAL_TABLET | Freq: Every day | ORAL | 2 refills | Status: DC

## 2015-11-07 NOTE — Telephone Encounter (Signed)
LVM.  Hgb stable from previous, bus still indicating anemia.  TSH normal.  Serum pregnancy negative.  Awaiting GC/CT results.  Ultrasound scheduled.  Will send in rx for OCPs to see if this helps with vaginal bleeding.  Patient to call with questions.  Please relay this information if she calls back.  Castle Lamons M. Nadine CountsGottschalk, DO PGY-3, [facility]

## 2015-11-14 ENCOUNTER — Ambulatory Visit (HOSPITAL_COMMUNITY)
Admission: RE | Admit: 2015-11-14 | Discharge: 2015-11-14 | Disposition: A | Discharge status: Home / Self Care | Payer: Medicaid Other | Source: Ambulatory Visit | Attending: Family Medicine | Admitting: Family Medicine

## 2015-11-14 DIAGNOSIS — N938 Other specified abnormal uterine and vaginal bleeding: Secondary | ICD-10-CM | POA: Diagnosis present

## 2015-11-14 IMAGING — US US PELVIS COMPLETE
1 series · 15 of 25 positions shown · non-contrast
Comparison: CT [DATE].  Ultrasound [DATE] .

CLINICAL DATA: Dysfunctional uterine bleeding.

EXAM:
TRANSABDOMINAL AND TRANSVAGINAL ULTRASOUND OF PELVIS
TECHNIQUE: Both transabdominal and transvaginal ultrasound examinations of the
pelvis were performed. Transabdominal technique was performed for
global imaging of the pelvis including uterus, ovaries, adnexal
regions, and pelvic cul-de-sac. It was necessary to proceed with
endovaginal exam following the transabdominal exam to visualize the
uterus and ovaries.

[Series 1: us pelvis complete · 15 of 54 slices shown]
[im 1/54]
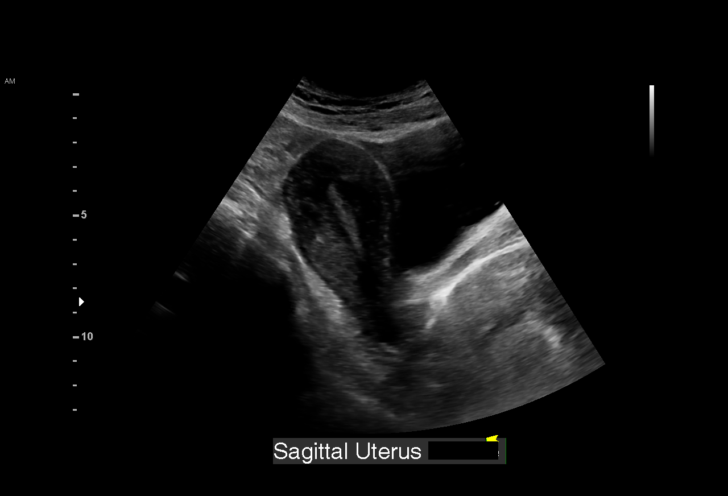
[im 5/54]
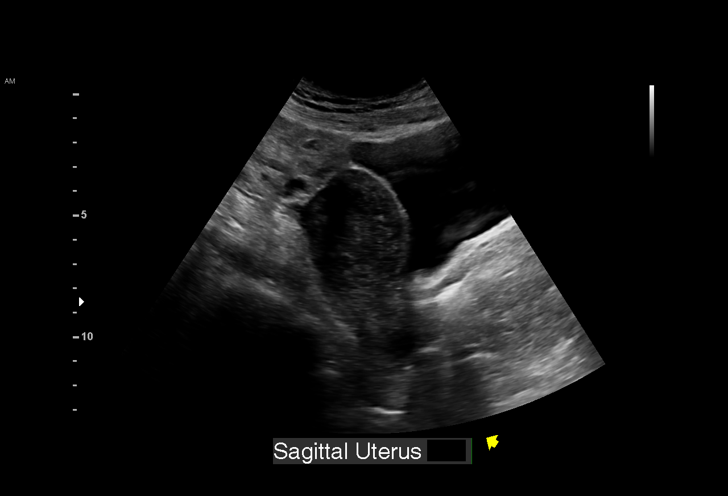
[im 9/54]
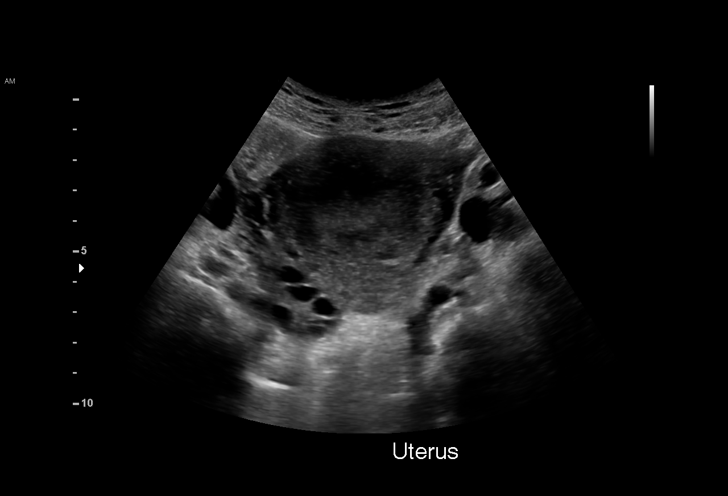
[im 12/54]
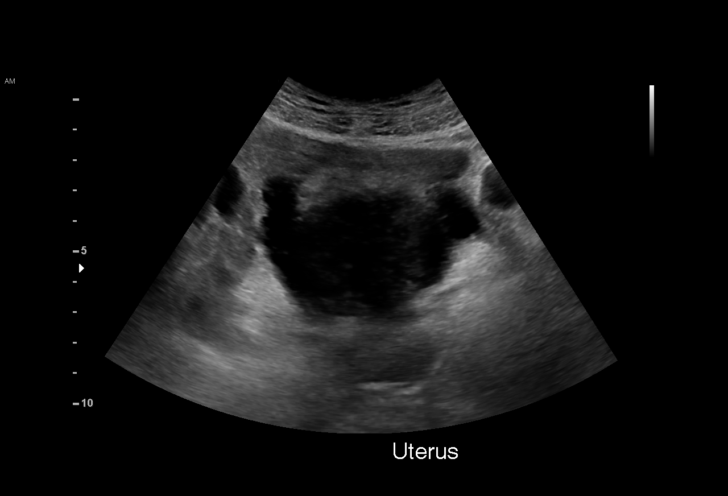
[im 16/54]
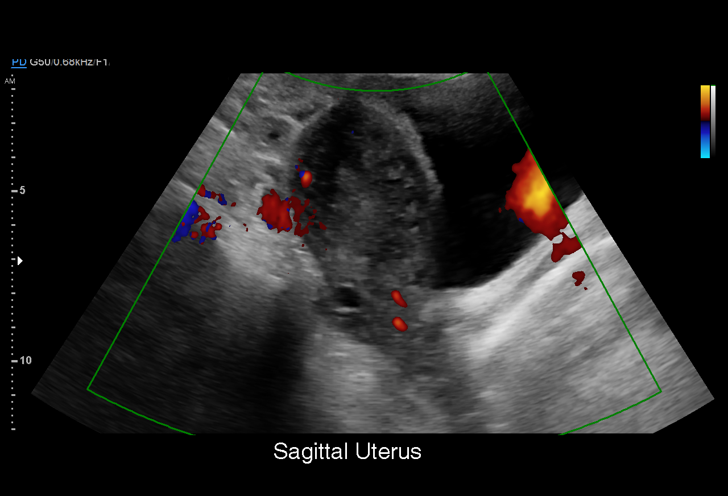
[im 20/54]
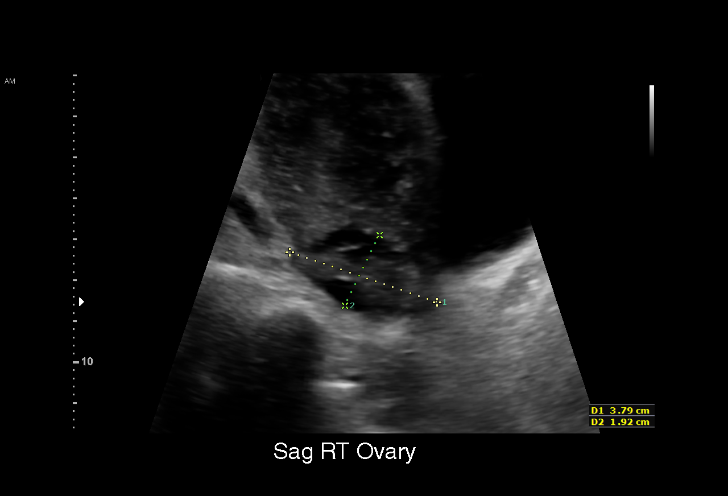
[im 23/54]
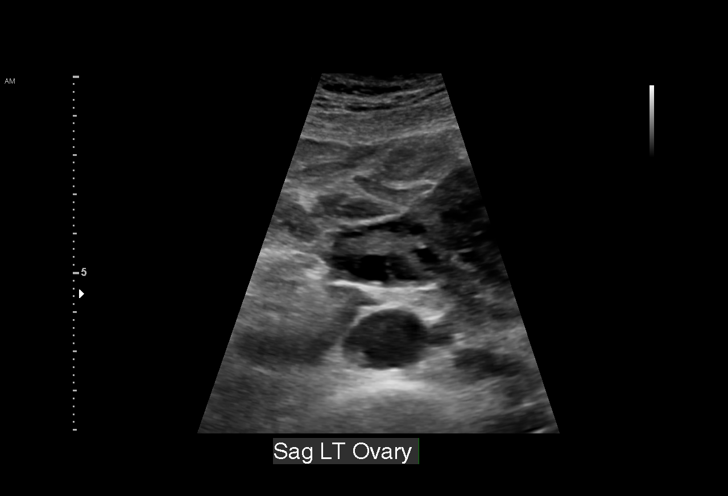
[im 27/54]
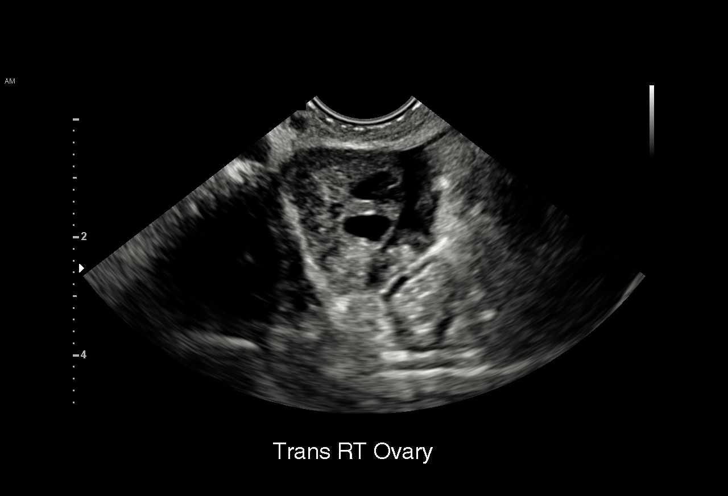
[im 31/54]
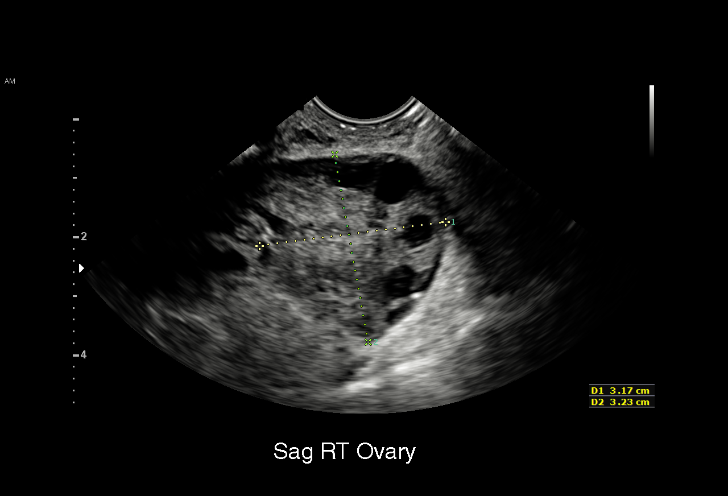
[im 34/54]
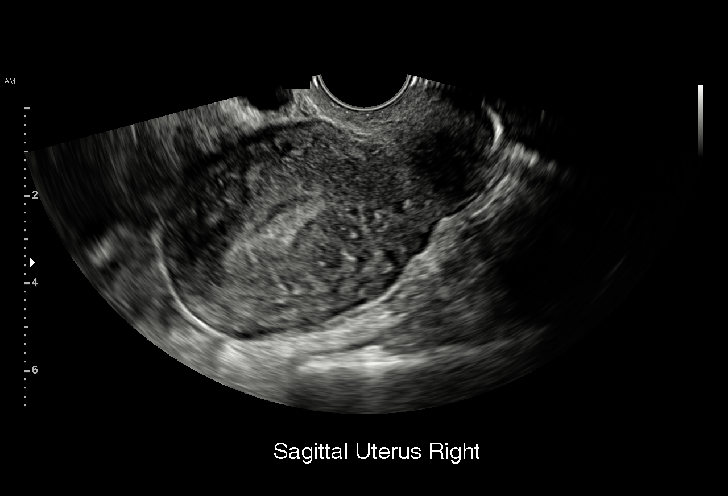
[im 38/54]
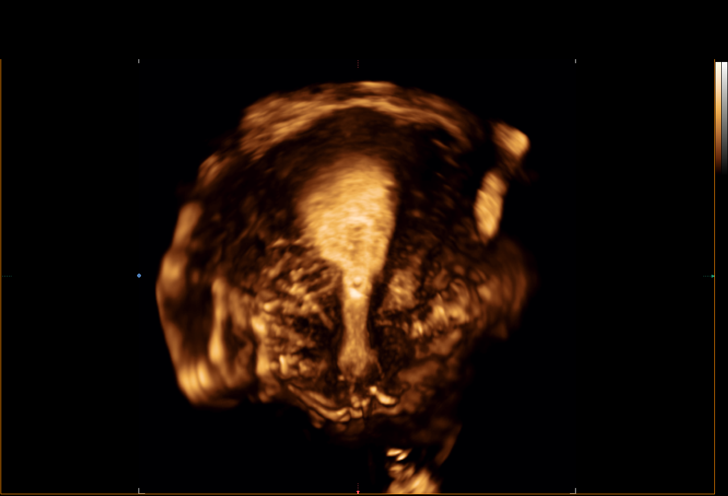
[im 42/54]
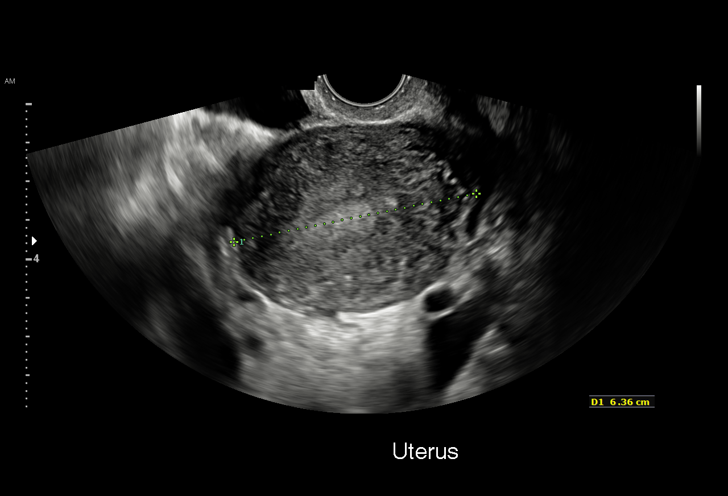
[im 45/54]
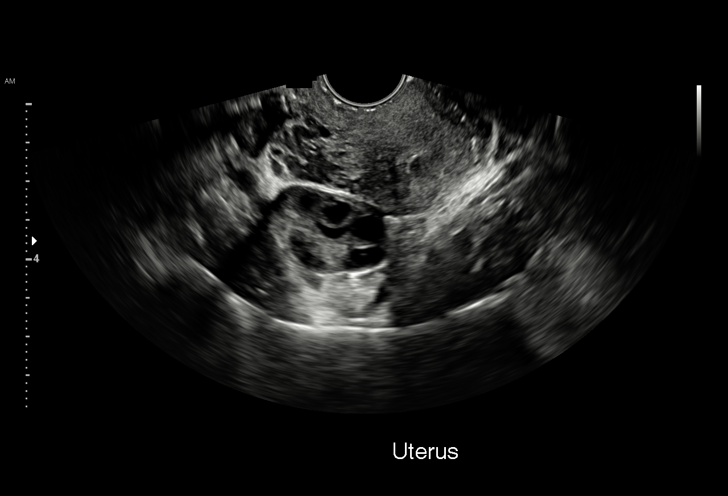
[im 49/54]
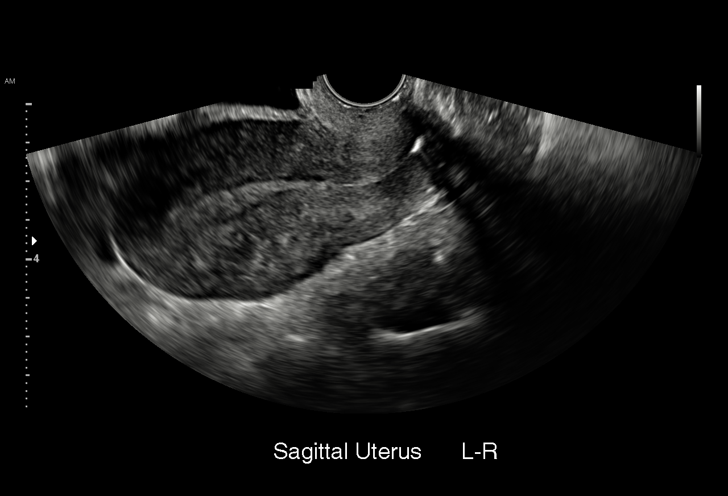
[im 54/54]
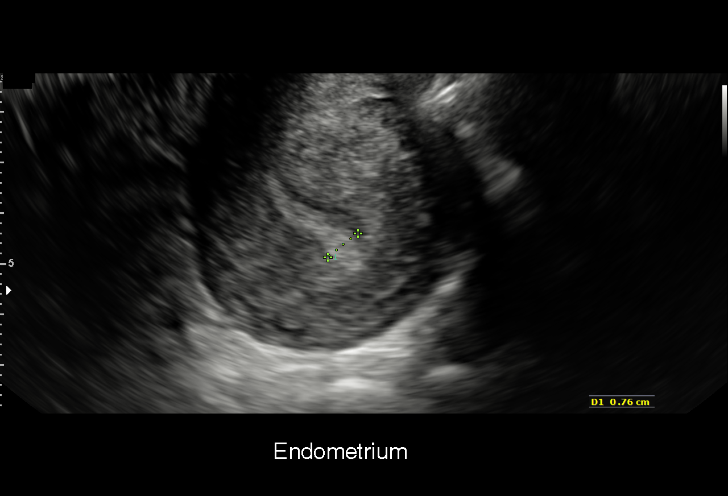

[15 of 25 positions shown; findings below may reference images not displayed]

FINDINGS: Uterus

Measurements: 8.5 x 5.2 x 6.4 cm. No fibroids or other mass
visualized. Echodensity with posterior shadowing is noted in the
cervix, possibly calcification. Clinical correlation suggested to
exclude displaced IUD fragment.

Endometrium

Thickness: 9.4 mm.  No focal abnormality visualized.

Right ovary

Measurements: 3.2 x 3.2 x 2.0 cm. Normal appearance/no adnexal mass.

Left ovary

Measurements: 2.7 x 1.8 x 2.4 cm. Normal appearance/no adnexal mass.

Other findings

No abnormal free fluid.
IMPRESSION: 1. Small echodensity with posterior shadowing is noted in the
cervix, possibly representing calcification. Clinical correlation
suggested to exclude displaced IUD fragment.

2. Exam is otherwise unremarkable .

## 2015-11-15 ENCOUNTER — Encounter: Payer: Self-pay | Admitting: Family Medicine

## 2015-12-04 ENCOUNTER — Other Ambulatory Visit: Payer: Self-pay | Admitting: Pharmacist

## 2015-12-04 MED ORDER — ONDANSETRON 8 MG PO TBDP: 8.0000 mg | ORAL_TABLET | Freq: Three times a day (TID) | ORAL | 3 refills | Status: DC | PRN

## 2016-01-26 ENCOUNTER — Other Ambulatory Visit: Payer: Self-pay | Admitting: Internal Medicine

## 2016-01-26 DIAGNOSIS — B20 Human immunodeficiency virus [HIV] disease: Secondary | ICD-10-CM

## 2016-02-10 NOTE — L&D Delivery Note (Signed)
  Rosalee Kaufmanhompson, PendingBaby [[id number]][[id number]]  Delivery Note Pt sat stood up to readjust herself for the epidural, made a grunt, nurses sat her back down in the bed, and the baby came out. We were right outside the room and immediately went in when the emergency light was pulled. At  0636 a viable adult was delivered via  (Presentation: OA;  ).  APGAR: 9/9 , ; weight pedning .  After 1 minute, the cord was clamped and cut. 40 units of pitocin diluted in 1000cc LR was infused rapidly IV.  The placenta separated spontaneously and delivered via CCT and maternal pushing effort.  It was inspected and appears to be intact with a 3 VC.  Anesthesia:  none Episiotomy:  none Lacerations: 1st degree periclitoral, not needing repair  Suture Repair:  Est. Blood Loss (mL):  150  Mom to postpartum.  Baby to Couplet care / Skin to Skin   The above was performed by Inez PilgrimKerrianne Minott, MD under my direct supervision and guidance.    CRESENZO-DISHMAN,Mcihael Hinderman 12/18/2016, 6:39 AM

## 2016-03-29 ENCOUNTER — Emergency Department (HOSPITAL_COMMUNITY)
Admission: EM | Admit: 2016-03-29 | Discharge: 2016-03-29 | Disposition: A | Discharge status: Home / Self Care | Payer: Self-pay | Attending: Emergency Medicine | Admitting: Emergency Medicine

## 2016-03-29 ENCOUNTER — Encounter (HOSPITAL_COMMUNITY): Payer: Self-pay

## 2016-03-29 ENCOUNTER — Emergency Department (HOSPITAL_COMMUNITY): Payer: Self-pay

## 2016-03-29 DIAGNOSIS — R51 Headache: Secondary | ICD-10-CM | POA: Insufficient documentation

## 2016-03-29 DIAGNOSIS — R519 Headache, unspecified: Secondary | ICD-10-CM

## 2016-03-29 DIAGNOSIS — Z79899 Other long term (current) drug therapy: Secondary | ICD-10-CM | POA: Insufficient documentation

## 2016-03-29 DIAGNOSIS — I10 Essential (primary) hypertension: Secondary | ICD-10-CM | POA: Insufficient documentation

## 2016-03-29 DIAGNOSIS — Z87891 Personal history of nicotine dependence: Secondary | ICD-10-CM | POA: Insufficient documentation

## 2016-03-29 LAB — I-STAT CHEM 8, ED
BUN: 12 mg/dL (ref 6–20)
Calcium, Ion: 1.12 mmol/L — ABNORMAL LOW (ref 1.15–1.40)
Chloride: 109 mmol/L (ref 101–111)
Creatinine, Ser: 0.7 mg/dL (ref 0.44–1.00)
Glucose, Bld: 107 mg/dL — ABNORMAL HIGH (ref 65–99)
HCT: 34 % — ABNORMAL LOW (ref 36.0–46.0)
Hemoglobin: 11.6 g/dL — ABNORMAL LOW (ref 12.0–15.0)
Potassium: 4.2 mmol/L (ref 3.5–5.1)
Sodium: 142 mmol/L (ref 135–145)
TCO2: 24 mmol/L (ref 0–100)

## 2016-03-29 LAB — I-STAT BETA HCG BLOOD, ED (MC, WL, AP ONLY): I-stat hCG, quantitative: <5 m[IU]/mL (ref <5)

## 2016-03-29 LAB — BASIC METABOLIC PANEL
Anion gap: 5 (ref 5–15)
BUN: 12 mg/dL (ref 6–20)
CO2: 24 mmol/L (ref 22–32)
CREATININE: 0.63 mg/dL (ref 0.44–1.00)
Calcium: 8.8 mg/dL — ABNORMAL LOW (ref 8.9–10.3)
Chloride: 110 mmol/L (ref 101–111)
Glucose, Bld: 106 mg/dL — ABNORMAL HIGH (ref 65–99)
POTASSIUM: 4.2 mmol/L (ref 3.5–5.1)
SODIUM: 139 mmol/L (ref 135–145)

## 2016-03-29 LAB — CBC
HCT: 32.7 % — ABNORMAL LOW (ref 36.0–46.0)
Hemoglobin: 10.4 g/dL — ABNORMAL LOW (ref 12.0–15.0)
MCH: 23.3 pg — ABNORMAL LOW (ref 26.0–34.0)
MCHC: 31.8 g/dL (ref 30.0–36.0)
MCV: 73.3 fL — ABNORMAL LOW (ref 78.0–100.0)
Platelets: 312 10*3/uL (ref 150–400)
RBC: 4.46 MIL/uL (ref 3.87–5.11)
RDW: 15.4 % (ref 11.5–15.5)
WBC: 6.1 10*3/uL (ref 4.0–10.5)

## 2016-03-29 IMAGING — CT CT HEAD W/O CM
3 of 4 series · 16 of 47 positions shown, 19 images · non-contrast
Comparison: [DATE]

CLINICAL DATA: Headache starting at 1 a.m..

EXAM:
CT HEAD WITHOUT CONTRAST
TECHNIQUE: Contiguous axial images were obtained from the base of the skull
through the vertex without intravenous contrast.

[Series 2: head w/o · axial · non-contrast · 0.42mm/px · z∈[-163,-43]mm · 10 of 29 slices shown, 13 images]
[im 3/29  brain]
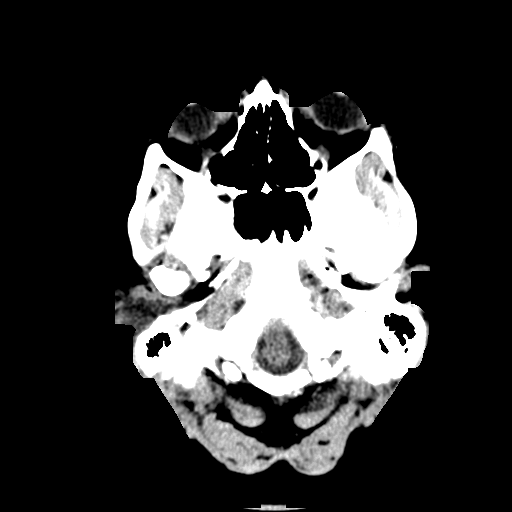
[im 3/29  bone]
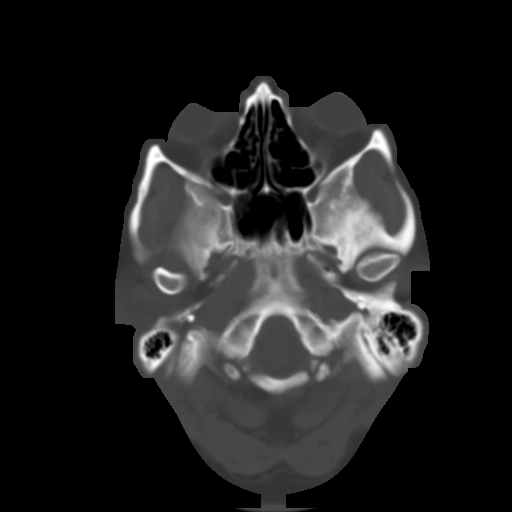
[im 5/29  brain]
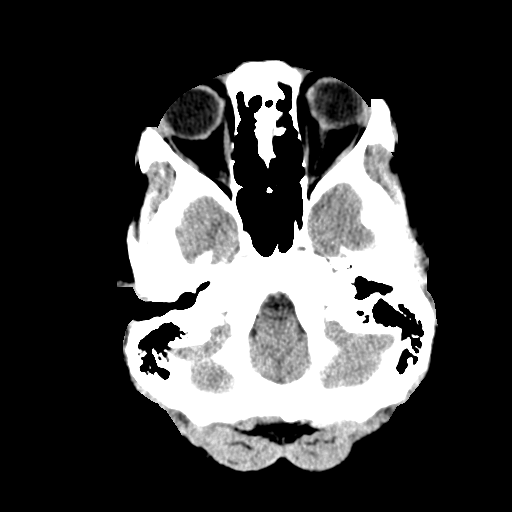
[im 9/29  brain]
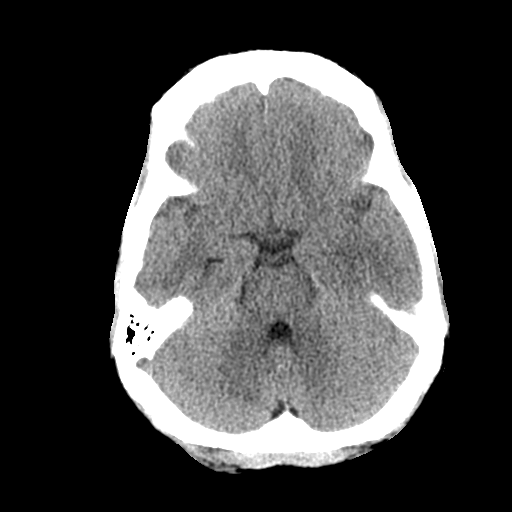
[im 11/29  brain]
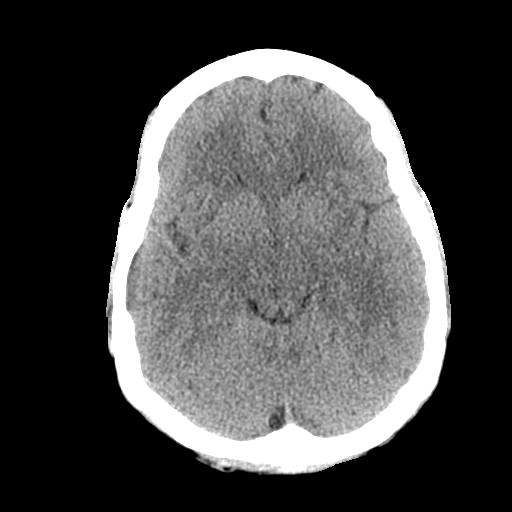
[im 13/29  brain]
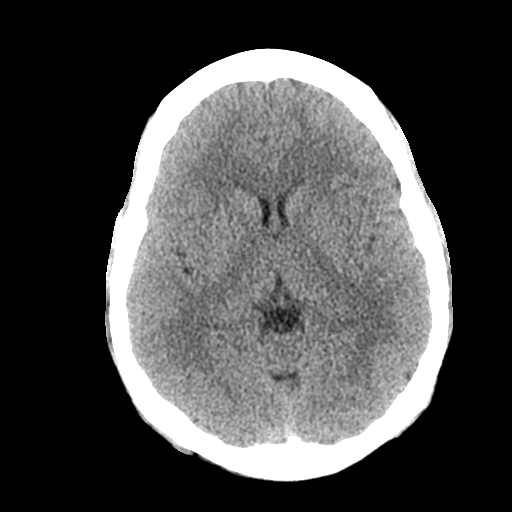
[im 13/29  bone]
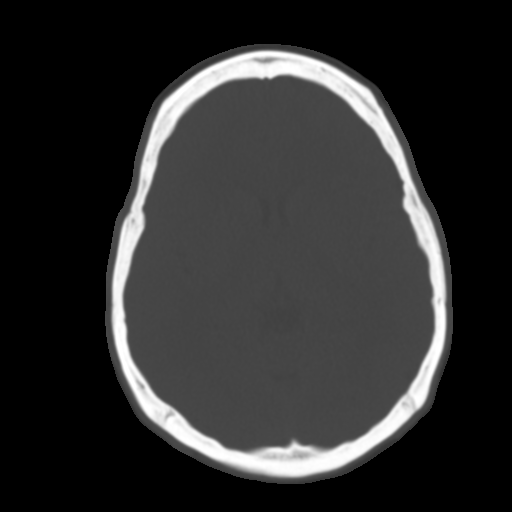
[im 17/29  brain]
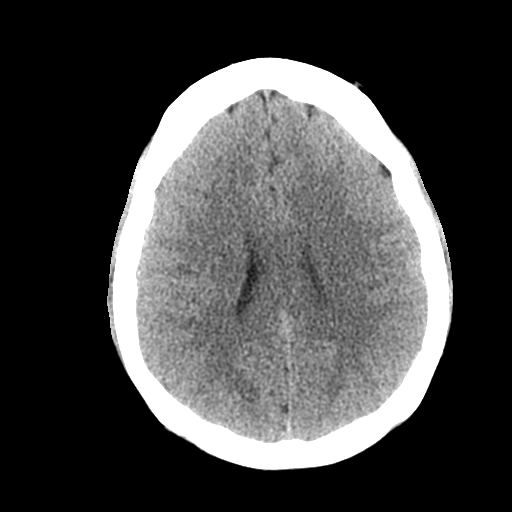
[im 19/29  brain]
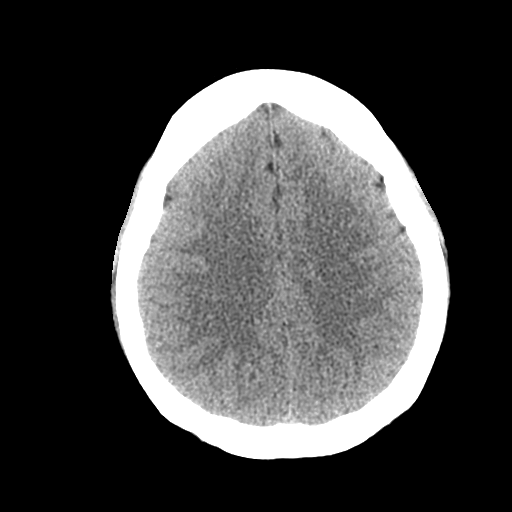
[im 21/29  brain]
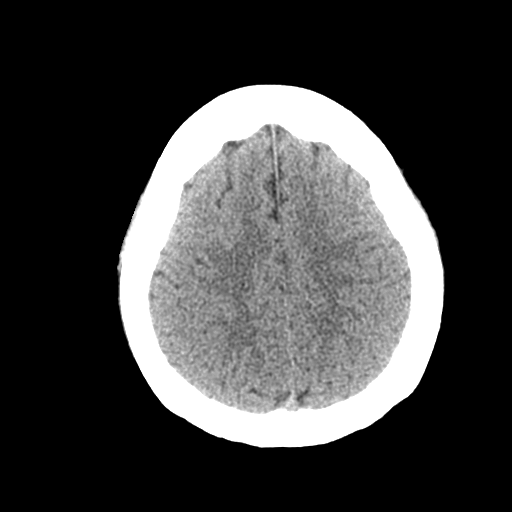
[im 25/29  brain]
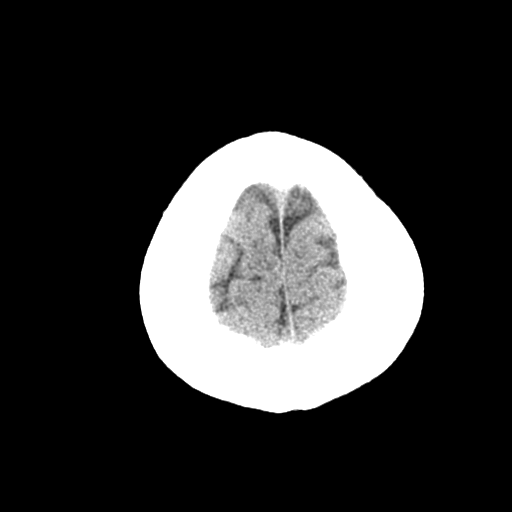
[im 25/29  bone]
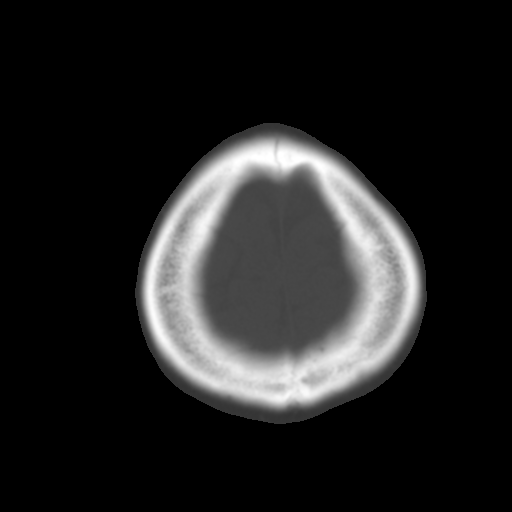
[im 27/29  brain]
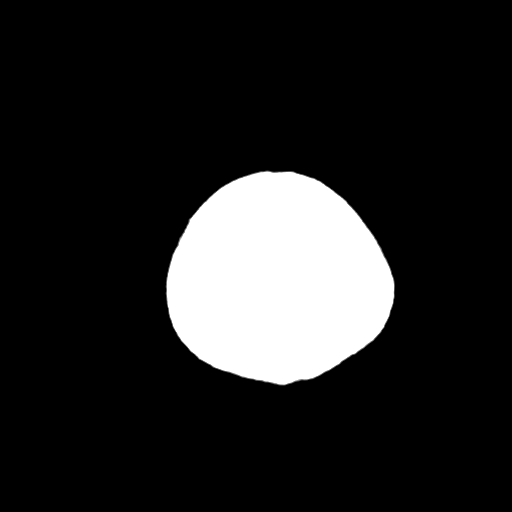

[Series 5: coronal · coronal · 0.29mm/px · 3 of 61 slices shown]
[im 21/61  brain]
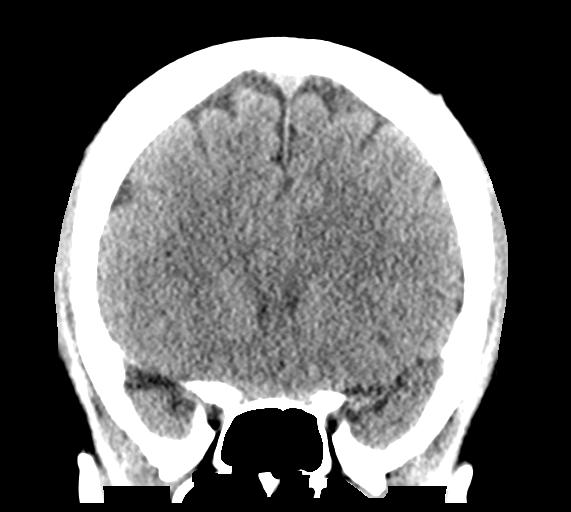
[im 27/61  brain]
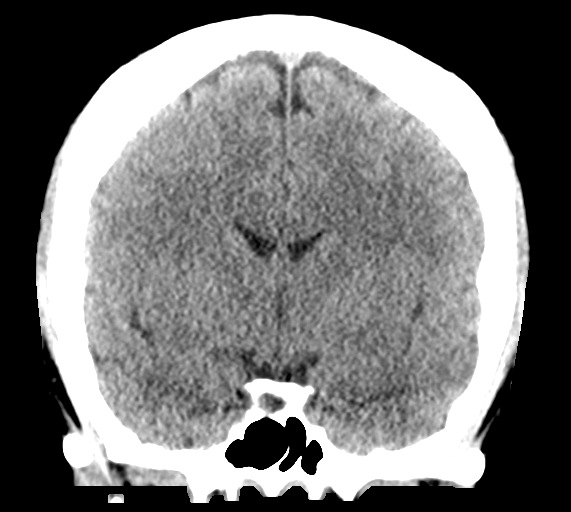
[im 34/61  brain]
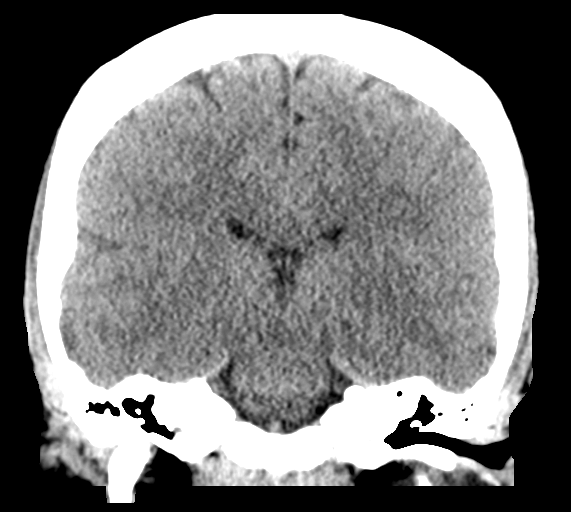

[Series 6: sagittal · sagittal · 0.30mm/px · 3 of 48 slices shown]
[im 16/48  brain]
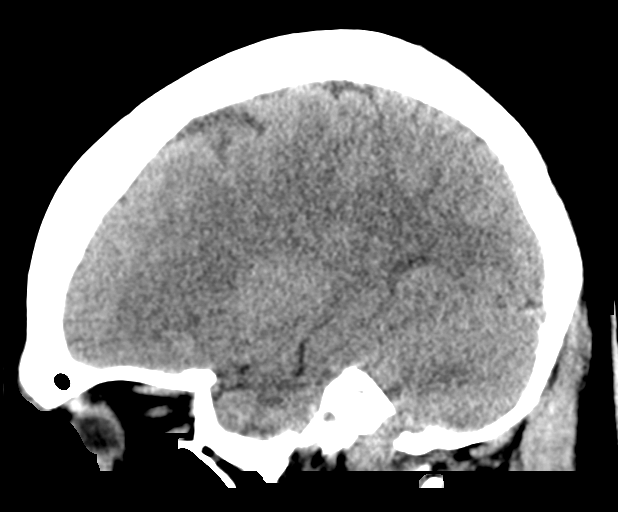
[im 24/48  brain]
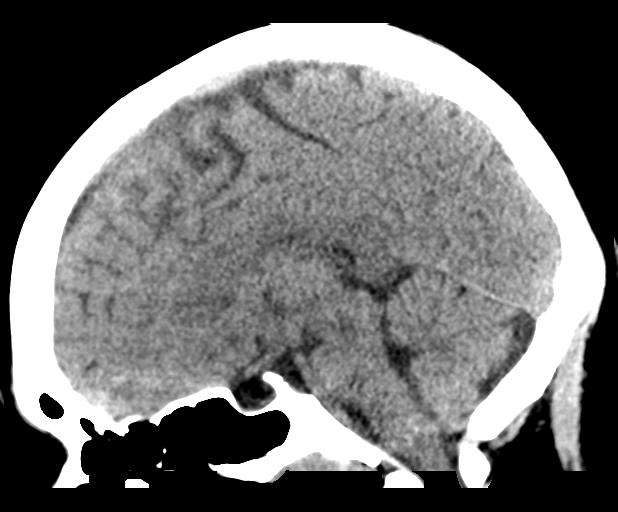
[im 32/48  brain]
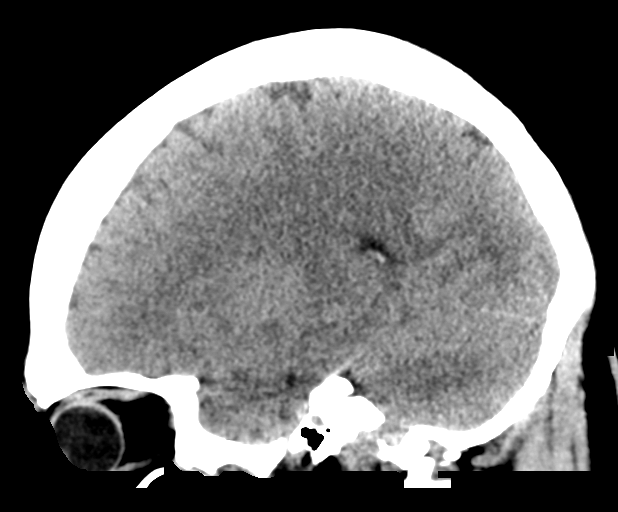

[16 of 47 positions shown; findings below may reference images not displayed]

FINDINGS: Brain: No evidence of acute or remote infarction, hemorrhage,
hydrocephalus, extra-axial collection or mass lesion/mass effect.

Vascular: No hyperdense vessel or unexpected calcification.

Skull: Normal. Negative for fracture or focal lesion.

Sinuses/Orbits: No acute finding.

Other: Chronic adenoid thickening, possibly related patient's HIV
history.
IMPRESSION: Negative head CT.  No explanation for headache.

## 2016-03-29 MED ORDER — SODIUM CHLORIDE 0.9 % IV BOLUS (SEPSIS): 1000.0000 mL | Freq: Once | INTRAVENOUS | Status: DC

## 2016-03-29 MED ORDER — SODIUM CHLORIDE 0.9 % IV SOLN
1000.0000 mL | Freq: Once | INTRAVENOUS | Status: AC
  Administered 2016-03-29: 1000 mL via INTRAVENOUS

## 2016-03-29 MED ORDER — MORPHINE SULFATE (PF) 4 MG/ML IV SOLN
4.0000 mg | Freq: Once | INTRAVENOUS | Status: AC
  Administered 2016-03-29: 4 mg via INTRAVENOUS
  Filled 2016-03-29: qty 1

## 2016-03-29 MED ORDER — PROCHLORPERAZINE EDISYLATE 5 MG/ML IJ SOLN
10.0000 mg | Freq: Once | INTRAMUSCULAR | Status: AC
Start: 2016-03-29 — End: 2016-03-29
  Administered 2016-03-29: 10 mg via INTRAVENOUS
  Filled 2016-03-29: qty 2

## 2016-03-29 MED ORDER — DIPHENHYDRAMINE HCL 50 MG/ML IJ SOLN
25.0000 mg | Freq: Once | INTRAMUSCULAR | Status: AC
  Administered 2016-03-29: 25 mg via INTRAVENOUS
  Filled 2016-03-29: qty 1

## 2016-03-29 MED ORDER — KETOROLAC TROMETHAMINE 30 MG/ML IJ SOLN
30.0000 mg | Freq: Once | INTRAMUSCULAR | Status: AC
  Administered 2016-03-29: 30 mg via INTRAVENOUS
  Filled 2016-03-29: qty 1

## 2016-03-29 MED ORDER — SODIUM CHLORIDE 0.9 % IV SOLN
1000.0000 mL | INTRAVENOUS | Status: DC
Start: 2016-03-29 — End: 2016-03-29
  Administered 2016-03-29: 1000 mL via INTRAVENOUS

## 2016-03-29 NOTE — ED Notes (Signed)
Bed: WA03 Expected date:  Expected time:  Means of arrival:  Comments: N/V/D
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
## 2016-03-29 NOTE — ED Notes (Signed)
Unable to collect labs patient is in xray 

## 2016-03-29 NOTE — Discharge Instructions (Signed)

## 2016-03-29 NOTE — ED Triage Notes (Signed)
Patient c/o "headache that goes all around " that started at 0100 today. Patient c/o blurred vision, sensitivity to light and sound, and N/V.

## 2016-05-12 ENCOUNTER — Encounter: Payer: Self-pay | Admitting: Family Medicine

## 2016-05-12 ENCOUNTER — Other Ambulatory Visit (HOSPITAL_COMMUNITY)
Admission: RE | Admit: 2016-05-12 | Discharge: 2016-05-12 | Disposition: A | Discharge status: Home / Self Care | Payer: Medicaid Other | Source: Ambulatory Visit | Attending: Family Medicine | Admitting: Family Medicine

## 2016-05-12 ENCOUNTER — Ambulatory Visit (INDEPENDENT_AMBULATORY_CARE_PROVIDER_SITE_OTHER): Payer: Medicaid Other | Admitting: Family Medicine

## 2016-05-12 VITALS — HR 76 | Temp 98.0°F | Ht 62.0 in | Wt 238.0 lb

## 2016-05-12 DIAGNOSIS — Z01419 Encounter for gynecological examination (general) (routine) without abnormal findings: Secondary | ICD-10-CM | POA: Insufficient documentation

## 2016-05-12 DIAGNOSIS — Z0000 Encounter for general adult medical examination without abnormal findings: Secondary | ICD-10-CM

## 2016-05-12 DIAGNOSIS — L732 Hidradenitis suppurativa: Secondary | ICD-10-CM | POA: Diagnosis not present

## 2016-05-12 DIAGNOSIS — Z124 Encounter for screening for malignant neoplasm of cervix: Secondary | ICD-10-CM

## 2016-05-12 DIAGNOSIS — Z331 Pregnant state, incidental: Secondary | ICD-10-CM

## 2016-05-12 DIAGNOSIS — Z113 Encounter for screening for infections with a predominantly sexual mode of transmission: Secondary | ICD-10-CM | POA: Insufficient documentation

## 2016-05-12 DIAGNOSIS — Z1151 Encounter for screening for human papillomavirus (HPV): Secondary | ICD-10-CM | POA: Insufficient documentation

## 2016-05-12 LAB — POCT URINE PREGNANCY: PREG TEST UR: POSITIVE — AB

## 2016-05-12 MED ORDER — CLINDAMYCIN PHOSPHATE 1 % EX GEL: Freq: Two times a day (BID) | CUTANEOUS | 0 refills | Status: DC

## 2016-05-12 MED ORDER — PRENATAL 27-0.8 MG PO TABS: 1.0000 | ORAL_TABLET | Freq: Every day | ORAL | 0 refills | Status: DC
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
## 2016-05-13 ENCOUNTER — Telehealth: Payer: Self-pay | Admitting: *Deleted

## 2016-05-13 DIAGNOSIS — L732 Hidradenitis suppurativa: Secondary | ICD-10-CM | POA: Insufficient documentation

## 2016-05-13 NOTE — Telephone Encounter (Signed)
Prior Authorization received from [facility] pharmacy for Clindamycin 1% gel. Formulary and PA form placed in provider box for completion. Clovis Pu, RN

## 2016-05-15 ENCOUNTER — Telehealth: Payer: Self-pay | Admitting: *Deleted

## 2016-05-15 LAB — CYTOLOGY - PAP
Chlamydia: NEGATIVE
DIAGNOSIS: NEGATIVE
HPV (WINDOPATH): NOT DETECTED
NEISSERIA GONORRHEA: NEGATIVE
TRICH (WINDOWPATH): NEGATIVE

## 2016-05-15 NOTE — Telephone Encounter (Signed)
Pt informed. Deseree Blount, CMA  

## 2016-05-15 NOTE — Telephone Encounter (Signed)
-----   Message from Erasmo Downer, MD sent at 05/15/2016 12:21 PM EDT ----- Normal pap, no GC/CT, Trich.  Please let patient know.  Erasmo Downer, MD, MPH PGY-3,  [facility] 05/15/2016 12:21 PM

## 2016-05-15 NOTE — Telephone Encounter (Signed)
PA form placed in to be faxed box requesting resubmission under correct Dx hidradenitis L 73.2. Kinnie Feil, RN, BSN

## 2016-05-15 NOTE — Telephone Encounter (Signed)
The PA seems to be for acne treatment.  Her Clindagel was for hidradenitis and that is usually covered.  Does this change it?  Thanks.  Erasmo Downer, MD, MPH PGY-3,  [facility] 05/15/2016 10:23 AM

## 2016-05-19 ENCOUNTER — Encounter (HOSPITAL_COMMUNITY): Payer: Self-pay | Admitting: *Deleted

## 2016-05-19 ENCOUNTER — Inpatient Hospital Stay (HOSPITAL_COMMUNITY)
Admission: AD | Admit: 2016-05-19 | Discharge: 2016-05-19 | Disposition: A | Discharge status: Home / Self Care | Payer: Medicaid Other | Source: Ambulatory Visit | Attending: Family Medicine | Admitting: Family Medicine

## 2016-05-19 DIAGNOSIS — O161 Unspecified maternal hypertension, first trimester: Secondary | ICD-10-CM | POA: Insufficient documentation

## 2016-05-19 DIAGNOSIS — G43009 Migraine without aura, not intractable, without status migrainosus: Secondary | ICD-10-CM

## 2016-05-19 DIAGNOSIS — O9989 Other specified diseases and conditions complicating pregnancy, childbirth and the puerperium: Secondary | ICD-10-CM

## 2016-05-19 DIAGNOSIS — O10919 Unspecified pre-existing hypertension complicating pregnancy, unspecified trimester: Secondary | ICD-10-CM

## 2016-05-19 DIAGNOSIS — O98711 Human immunodeficiency virus [HIV] disease complicating pregnancy, first trimester: Secondary | ICD-10-CM | POA: Insufficient documentation

## 2016-05-19 DIAGNOSIS — O26891 Other specified pregnancy related conditions, first trimester: Secondary | ICD-10-CM | POA: Insufficient documentation

## 2016-05-19 DIAGNOSIS — Z9049 Acquired absence of other specified parts of digestive tract: Secondary | ICD-10-CM | POA: Insufficient documentation

## 2016-05-19 DIAGNOSIS — Z79899 Other long term (current) drug therapy: Secondary | ICD-10-CM | POA: Insufficient documentation

## 2016-05-19 DIAGNOSIS — O99331 Smoking (tobacco) complicating pregnancy, first trimester: Secondary | ICD-10-CM | POA: Insufficient documentation

## 2016-05-19 DIAGNOSIS — Z3A09 9 weeks gestation of pregnancy: Secondary | ICD-10-CM | POA: Insufficient documentation

## 2016-05-19 DIAGNOSIS — R51 Headache: Secondary | ICD-10-CM | POA: Insufficient documentation

## 2016-05-19 LAB — URINALYSIS, ROUTINE W REFLEX MICROSCOPIC
Bilirubin Urine: NEGATIVE
GLUCOSE, UA: NEGATIVE mg/dL
HGB URINE DIPSTICK: NEGATIVE
Ketones, ur: NEGATIVE mg/dL
Leukocytes, UA: NEGATIVE
Nitrite: NEGATIVE
Protein, ur: NEGATIVE mg/dL
Specific Gravity, Urine: 1.011 (ref 1.005–1.030)
pH: 6 (ref 5.0–8.0)

## 2016-05-19 MED ORDER — LABETALOL HCL 200 MG PO TABS: 200.0000 mg | ORAL_TABLET | Freq: Two times a day (BID) | ORAL | 2 refills | Status: DC

## 2016-05-19 MED ORDER — ASPIRIN 81 MG PO CHEW: 81.0000 mg | CHEWABLE_TABLET | Freq: Every day | ORAL | 0 refills | Status: DC

## 2016-05-19 MED ORDER — METOCLOPRAMIDE HCL 5 MG/ML IJ SOLN
10.0000 mg | Freq: Once | INTRAMUSCULAR | Status: AC
  Administered 2016-05-19: 10 mg via INTRAVENOUS
  Filled 2016-05-19: qty 2

## 2016-05-19 MED ORDER — DEXAMETHASONE SODIUM PHOSPHATE 10 MG/ML IJ SOLN
10.0000 mg | Freq: Once | INTRAMUSCULAR | Status: AC
  Administered 2016-05-19: 10 mg via INTRAVENOUS
  Filled 2016-05-19: qty 1

## 2016-05-19 MED ORDER — DIPHENHYDRAMINE HCL 50 MG/ML IJ SOLN: 25.0000 mg | INTRAMUSCULAR | Status: DC | PRN

## 2016-05-19 MED ORDER — DIPHENHYDRAMINE HCL 50 MG/ML IJ SOLN
25.0000 mg | Freq: Once | INTRAMUSCULAR | Status: AC
  Administered 2016-05-19: 25 mg via INTRAVENOUS
  Filled 2016-05-19: qty 1

## 2016-05-19 MED ORDER — LACTATED RINGERS IV BOLUS (SEPSIS)
1000.0000 mL | Freq: Once | INTRAVENOUS | Status: AC
  Administered 2016-05-19: 1000 mL via INTRAVENOUS

## 2016-05-19 NOTE — Telephone Encounter (Signed)
PA for clindamycin 1% gel is pending per [place] [employer].  Clovis Pu, RN

## 2016-05-19 NOTE — Telephone Encounter (Signed)
PA for clindamycin gel was denied via [place] [employer].  Clovis Pu, RN

## 2016-05-19 NOTE — Telephone Encounter (Signed)
PA need to be completed for clindamycin 1% gel.  Medicaid still requires prior authorization even with use of Dx code: L73.2.  PA form placed in provider box for completion.  Clovis Pu, RN

## 2016-05-19 NOTE — MAU Provider Note (Signed)
History     CSN: [id number]  Arrival date and time: 05/19/16 4098   First Provider Initiated Contact with Patient 05/19/16 1840      Chief Complaint  Patient presents with  . Headache   Patient is a 31 year old G5 P4 at 9 weeks and 2 days by last menstrual period. Patient reports that she started with a gradual headache 2 days ago and since it has gotten worse. The point where she can't take it any longer. She is a chronic hypertensive on amlodipine and hydrochlorothiazide which she's been on for some years and has continued to take since finding out she was pregnant. She does report with her previous pregnancy she did have high blood pressure at the end of her pregnancies but never reports having a diagnosis of preeclampsia. Patient is also HIV positive and reports noncompliance with her medications.   Headache   This is a new problem. The current episode started yesterday. The problem occurs constantly. The problem has been gradually worsening. The pain is located in the bilateral, frontal and temporal region. The pain does not radiate. The pain quality is not similar to prior headaches. The quality of the pain is described as aching. The pain is at a severity of 8/10. The pain is severe. Associated symptoms include dizziness, phonophobia and photophobia. Pertinent negatives include no abdominal pain, blurred vision, coughing, ear pain, eye pain, fever, nausea, neck pain, numbness, rhinorrhea, sinus pressure, sore throat, swollen glands, tingling, visual change, vomiting or weakness. The symptoms are aggravated by activity and bright light. She has tried acetaminophen for the symptoms. The treatment provided no relief.    OB History    Gravida Para Term Preterm AB Living   0   4   SAB TAB Ectopic Multiple Live Births         0 4      Past Medical History:  Diagnosis Date  . Cholecystitis 07/16/10  . Genital warts 2004  . Hemorrhoids   . HIV (human immunodeficiency virus  infection) (HCC)   . Hx of pelvic inflammatory disease 08/31/2013   And hx of multiple STDs   . Hypertension   . Sickle cell trait [facility])     Past Surgical History:  Procedure Laterality Date  . CHOLECYSTECTOMY    . WISDOM TOOTH EXTRACTION      Family History  Problem Relation Age of Onset  . Cancer Mother   . Diabetes Maternal Aunt     Social History  Substance Use Topics  . Smoking status: Former Smoker    Packs/day: 0.30    Types: Cigarettes    Start date: 03/11/2012  . Smokeless tobacco: Never Used  . Alcohol use Yes     Comment: occ    Allergies:  Allergies  Allergen Reactions  . Stadol [Butorphanol Tartrate] Itching    Tolerates percocet    Facility-Administered Medications Prior to Admission  Medication Dose Route Frequency Provider Last Rate Last Dose  . medroxyPROGESTERone (DEPO-PROVERA) injection 150 mg  150 mg Intramuscular Once [place], CNM       Prescriptions Prior to Admission  Medication Sig Dispense Refill Last Dose  . acetaminophen (TYLENOL) 500 MG tablet Take 500 mg by mouth every 6 (six) hours as needed for mild pain, moderate pain or headache.   05/19/2016 at 1500  . amLODipine (NORVASC) 5 MG tablet Take 1 tablet (5 mg total) by mouth daily. 60 tablet 0 05/18/2016 at Unknown time  . hydrochlorothiazide (HYDRODIURIL) 25  MG tablet Take 1 tablet (25 mg total) by mouth daily. 60 tablet 0 05/18/2016 at Unknown time  . PREZCOBIX 800-150 MG tablet TAKE 1 TABLET BY MOUTH DAILY. SWALLOW WHOLE. DO NOT CRUSH, BREAK OR CHEW TABLETS.TK WITH FOOD 30 tablet 5 Past Week at Unknown time  . TRUVADA 200-300 MG tablet TAKE 1 TABLET BY MOUTH DAILY 30 tablet 5 Past Week at Unknown time  . clindamycin (CLINDAGEL) 1 % gel Apply topically 2 (two) times daily. (Patient not taking: Reported on 05/19/2016) 30 g 0 Not Taking at not filled yet  . docusate sodium (COLACE) 100 MG capsule Take 1 capsule (100 mg total) by mouth every 12 (twelve) hours. 60 capsule 0 prn  . Elastic  Bandages & Supports (COMFORT FIT MATERNITY SUPP LG) MISC 1 Device by Does not apply route daily. 1 each 0 Taking  . ferrous sulfate 324 (65 Fe) MG TBEC Take 1 tablet (324 mg total) by mouth 2 (two) times daily after a meal. 60 tablet 0   . hydrocortisone (ANUSOL-HC) 2.5 % rectal cream Apply rectally 2 times daily (Patient not taking: Reported on 09/02/2015) 28 g 0 Not Taking  . ibuprofen (ADVIL,MOTRIN) 400 MG tablet Take 1 tablet (400 mg total) by mouth every 6 (six) hours. 30 tablet 0 Taking  . norgestrel-ethinyl estradiol (LO/OVRAL,CRYSELLE) 0.3-30 MG-MCG tablet Take 1 tablet by mouth daily. 1 Package 2   . ondansetron (ZOFRAN-ODT) 8 MG disintegrating tablet Take 1 tablet (8 mg total) by mouth every 8 (eight) hours as needed for nausea or vomiting. 20 tablet 3   . Prenatal Vit-Fe Fumarate-FA (MULTIVITAMIN-PRENATAL) 27-0.8 MG TABS tablet Take 1 tablet by mouth daily at 12 noon. (Patient not taking: Reported on 05/19/2016) 30 each 0 Not Taking at Unknown time    Review of Systems  Constitutional: Negative for chills and fever.  HENT: Negative for ear pain, rhinorrhea, sinus pressure and sore throat.   Eyes: Positive for photophobia. Negative for blurred vision and pain.  Respiratory: Negative for cough.   Gastrointestinal: Negative for abdominal pain, nausea and vomiting.  Genitourinary: Negative for difficulty urinating, dysuria and frequency.  Musculoskeletal: Negative for neck pain.  Neurological: Positive for dizziness and headaches. Negative for tingling, weakness and numbness.   Physical Exam   Blood pressure (!) 147/95, pulse 75, temperature 98 F (36.7 C), temperature source Oral, resp. rate 18, last menstrual period 03/15/2016, SpO2 100 %, not currently breastfeeding.  Physical Exam  Vitals reviewed. Constitutional: She is oriented to person, place, and time.  In distress secondary to pain  HENT:  Head: Normocephalic and atraumatic.  Eyes: Conjunctivae and EOM are normal. Pupils  are equal, round, and reactive to light.  Cardiovascular: Normal rate and intact distal pulses.   No murmur heard. Respiratory: Effort normal and breath sounds normal. No respiratory distress. She has no wheezes.  GI: Soft. She exhibits no distension. There is no tenderness. There is no rebound and no guarding.  Musculoskeletal: Normal range of motion. She exhibits no edema.  Neurological: She is alert and oriented to person, place, and time. No cranial nerve deficit. Coordination normal.  Skin: Skin is warm.  Psychiatric: She has a normal mood and affect. Her behavior is normal.    MAU Course  Procedures  MDM In MA U patient received migraine cocktail which decreased her pain from an intended to 4 out of 10. She reported significant improvement in her pain. And feels she is ready to be discharged home.  Of note I did review  a CT of the head performed on 03/29/2016 that was performed for headache and was unremarkable.  Discussed with the patient some changes that would be recommended in her medical regimen based on the fact that she is now pregnant. Patient voiced understanding and technologist the need to establish care.  Assessment and Plan  #1: Migraine headache slightly atypical being bilateral but was associated with phonophobia and photophobia. Improved significantly with migraine cocktail of Reglan and Benadryl Decadron and a liter of IV fluids. Okay for discharge home. Continue to take Tylenol when necessary symptomatically at home and focus on hydration. #2: Chronic hypertension recommend discontinue hydrochlorothiazide at this time and recommend start labetalol 200 mg twice a day.additionally recommend patient start a baby aspirin 81 mg at [redacted] weeks gestation. #3: HIV recommended compliance with medication and follow up with infectious disease.  Ernestina Penna 05/19/2016, 7:18 PM

## 2016-05-19 NOTE — Telephone Encounter (Signed)
Completed and returned to [facility].  Erasmo Downer, MD, MPH PGY-3,  [facility] 05/19/2016 1:37 PM

## 2016-05-19 NOTE — Discharge Instructions (Signed)
Hypertension During Pregnancy °Hypertension is also called high blood pressure. High blood pressure means that the force of your blood moving in your body is too strong. When you are pregnant, this condition should be watched carefully. It can cause problems for you and your baby. °Follow these instructions at home: °Eating and drinking  °· Drink enough fluid to keep your pee (urine) clear or pale yellow. °· Eat healthy foods that are low in salt (sodium). °¨ Do not add salt to your food. °¨ Check labels on foods and drinks to see much salt is in them. Look on the label where you see "Sodium." °Lifestyle  °· Do not use any products that contain nicotine or tobacco, such as cigarettes and e-cigarettes. If you need help quitting, ask your doctor. °· Do not use alcohol. °· Avoid caffeine. °· Avoid stress. Rest and get plenty of sleep. °General instructions  °· Take over-the-counter and prescription medicines only as told by your doctor. °· While lying down, lie on your left side. This keeps pressure off your baby. °· While sitting or lying down, raise (elevate) your feet. Try putting some pillows under your lower legs. °· Exercise regularly. Ask your doctor what kinds of exercise are best for you. °· Keep all prenatal and follow-up visits as told by your doctor. This is important. °Contact a doctor if: °· You have symptoms that your doctor told you to watch for, such as: °¨ Fever. °¨ Throwing up (vomiting). °¨ Headache. °Get help right away if: °· You have very bad pain in your belly (abdomen). °· You are throwing up, and this does not get better with treatment. °· You suddenly get swelling in your hands, ankles, or face. °· You gain 4 lb (1.8 kg) or more in 1 week. °· You get bleeding from your vagina. °· You have blood in your pee. °· You do not feel your baby moving as much as normal. °· You have a change in vision. °· You have muscle twitching or sudden tightening (spasms). °· You have trouble breathing. °· Your  lips or fingernails turn blue. °This information is not intended to replace advice given to you by your health care provider. Make sure you discuss any questions you have with your health care provider. °Document Released: 02/28/2010 Document Revised: 10/08/2015 Document Reviewed: 10/08/2015 °Elsevier Interactive Patient Education © 2017 Elsevier Inc. °Migraine Headache °A migraine headache is a very strong throbbing pain on one side or both sides of your head. Migraines can also cause other symptoms. Talk with your doctor about what things may bring on (trigger) your migraine headaches. °Follow these instructions at home: °Medicines  °· Take over-the-counter and prescription medicines only as told by your doctor. °· Do not drive or use heavy machinery while taking prescription pain medicine. °· To prevent or treat constipation while you are taking prescription pain medicine, your doctor may recommend that you: °¨ Drink enough fluid to keep your pee (urine) clear or pale yellow. °¨ Take over-the-counter or prescription medicines. °¨ Eat foods that are high in fiber. These include fresh fruits and vegetables, whole grains, and beans. °¨ Limit foods that are high in fat and processed sugars. These include fried and sweet foods. °Lifestyle  °· Avoid alcohol. °· Do not use any products that contain nicotine or tobacco, such as cigarettes and e-cigarettes. If you need help quitting, ask your doctor. °· Get at least 8 hours of sleep every night. °· Limit your stress. °General instructions  ° °· Keep   Keep a journal to find out what may bring on your migraines. For example, write down:  What you eat and drink.  How much sleep you get.  Any change in what you eat or drink.  Any change in your medicines.  If you have a migraine:  Avoid things that make your symptoms worse, such as bright lights.  It may help to lie down in a dark, quiet room.  Do not drive or use heavy machinery.  Ask your doctor what  activities are safe for you.  Keep all follow-up visits as told by your doctor. This is important. Contact a doctor if:  You get a migraine that is different or worse than your usual migraines. Get help right away if:  Your migraine gets very bad.  You have a fever.  You have a stiff neck.  You have trouble seeing.  Your muscles feel weak or like you cannot control them.  You start to lose your balance a lot.  You start to have trouble walking.  You pass out (faint). This information is not intended to replace advice given to you by your health care provider. Make sure you discuss any questions you have with your health care provider. Document Released: 11/05/2007 Document Revised: 08/16/2015 Document Reviewed: 07/15/2015 Elsevier Interactive Patient Education  2017 [employer].

## 2016-05-19 NOTE — MAU Note (Signed)
Head has been hurting for 2 days, feel dizzy.  Took Tylenol, last time was at 1500, helped briefly

## 2016-06-12 ENCOUNTER — Telehealth: Payer: Self-pay

## 2016-06-12 NOTE — Telephone Encounter (Signed)
Patient called office from local health department.  She just found out she is pregnant and has been without medications since she lost insurance in February.  She was advised to come for office visit with the pharmacist on Wednesday.  She will need [facility] until her pregnancy medicaid starts.    She will need referral to high risk OB clinic.    Laurell Josephsammy K King, RN

## 2016-06-12 NOTE — Telephone Encounter (Signed)
Thanks Tammy, I wish [name] would have let us know by now. There was no reason not to enroll her into [facility] quickly.

## 2016-06-17 ENCOUNTER — Ambulatory Visit: Payer: Self-pay

## 2016-07-02 ENCOUNTER — Other Ambulatory Visit (HOSPITAL_COMMUNITY): Payer: Self-pay | Admitting: Nurse Practitioner

## 2016-07-02 DIAGNOSIS — Z3689 Encounter for other specified antenatal screening: Secondary | ICD-10-CM

## 2016-07-02 DIAGNOSIS — B20 Human immunodeficiency virus [HIV] disease: Secondary | ICD-10-CM

## 2016-07-02 DIAGNOSIS — Z3A19 19 weeks gestation of pregnancy: Secondary | ICD-10-CM

## 2016-07-02 LAB — OB RESULTS CONSOLE HEPATITIS B SURFACE ANTIGEN
HEP B S AG: NEGATIVE
HEP B S AG: NEGATIVE

## 2016-07-02 LAB — OB RESULTS CONSOLE ANTIBODY SCREEN: Antibody Screen: NEGATIVE

## 2016-07-02 LAB — OB RESULTS CONSOLE GC/CHLAMYDIA
Chlamydia: NEGATIVE
GC PROBE AMP, GENITAL: NEGATIVE

## 2016-07-02 LAB — OB RESULTS CONSOLE VARICELLA ZOSTER ANTIBODY, IGG: Varicella: IMMUNE

## 2016-07-02 LAB — OB RESULTS CONSOLE RUBELLA ANTIBODY, IGM: RUBELLA: IMMUNE

## 2016-07-02 LAB — CYSTIC FIBROSIS DIAGNOSTIC STUDY: Interpretation-CFDNA:: NEGATIVE

## 2016-07-02 LAB — OB RESULTS CONSOLE ABO/RH: RH Type: POSITIVE

## 2016-07-02 LAB — OB RESULTS CONSOLE HIV ANTIBODY (ROUTINE TESTING): HIV: REACTIVE

## 2016-07-02 LAB — SICKLE CELL SCREEN

## 2016-07-02 LAB — OB RESULTS CONSOLE RPR: RPR: NONREACTIVE

## 2016-07-03 ENCOUNTER — Other Ambulatory Visit (HOSPITAL_COMMUNITY): Payer: Self-pay | Admitting: *Deleted

## 2016-07-03 ENCOUNTER — Ambulatory Visit (HOSPITAL_COMMUNITY)
Admission: RE | Admit: 2016-07-03 | Discharge: 2016-07-03 | Disposition: A | Discharge status: Home / Self Care | Payer: Medicaid Other | Source: Ambulatory Visit | Attending: Nurse Practitioner | Admitting: Nurse Practitioner

## 2016-07-03 ENCOUNTER — Encounter (HOSPITAL_COMMUNITY): Payer: Self-pay

## 2016-07-03 ENCOUNTER — Other Ambulatory Visit (HOSPITAL_COMMUNITY): Payer: Self-pay | Admitting: Nurse Practitioner

## 2016-07-03 DIAGNOSIS — O10012 Pre-existing essential hypertension complicating pregnancy, second trimester: Secondary | ICD-10-CM | POA: Insufficient documentation

## 2016-07-03 DIAGNOSIS — Z3689 Encounter for other specified antenatal screening: Secondary | ICD-10-CM

## 2016-07-03 DIAGNOSIS — Z3A19 19 weeks gestation of pregnancy: Secondary | ICD-10-CM

## 2016-07-03 DIAGNOSIS — E669 Obesity, unspecified: Secondary | ICD-10-CM | POA: Insufficient documentation

## 2016-07-03 DIAGNOSIS — B20 Human immunodeficiency virus [HIV] disease: Secondary | ICD-10-CM

## 2016-07-03 DIAGNOSIS — Z3A14 14 weeks gestation of pregnancy: Secondary | ICD-10-CM | POA: Insufficient documentation

## 2016-07-03 DIAGNOSIS — O98712 Human immunodeficiency virus [HIV] disease complicating pregnancy, second trimester: Secondary | ICD-10-CM | POA: Insufficient documentation

## 2016-07-03 DIAGNOSIS — Z0489 Encounter for examination and observation for other specified reasons: Secondary | ICD-10-CM

## 2016-07-03 DIAGNOSIS — O99212 Obesity complicating pregnancy, second trimester: Secondary | ICD-10-CM | POA: Insufficient documentation

## 2016-07-03 DIAGNOSIS — Z363 Encounter for antenatal screening for malformations: Secondary | ICD-10-CM

## 2016-07-03 DIAGNOSIS — IMO0002 Reserved for concepts with insufficient information to code with codable children: Secondary | ICD-10-CM

## 2016-07-03 DIAGNOSIS — Z6841 Body Mass Index (BMI) 40.0 and over, adult: Secondary | ICD-10-CM | POA: Insufficient documentation

## 2016-07-03 DIAGNOSIS — Z21 Asymptomatic human immunodeficiency virus [HIV] infection status: Secondary | ICD-10-CM | POA: Insufficient documentation

## 2016-07-03 IMAGING — US US MFM OB DETAIL+14 WK
1 series · 14 of 28 positions shown · non-contrast
Comparison: none

[Series 1: us mfm ob detail+14 wk · 60 acquisitions, 14 frames shown]
[im 3/60]
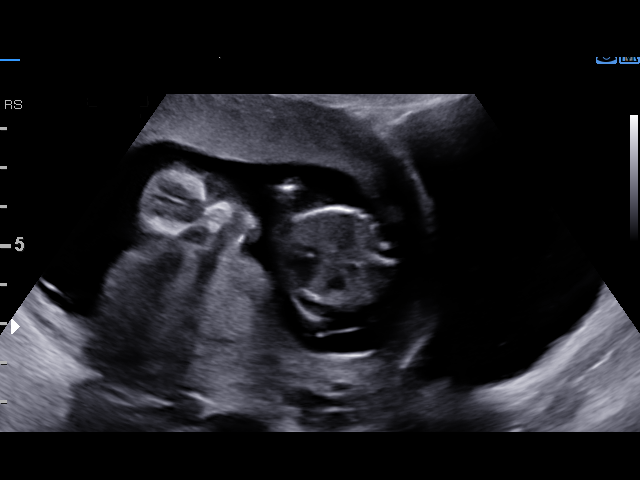
[im 7/60]
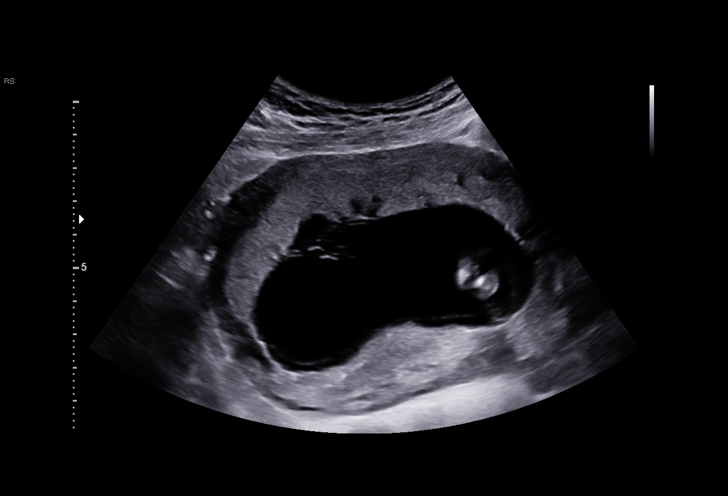
[im 11/60]
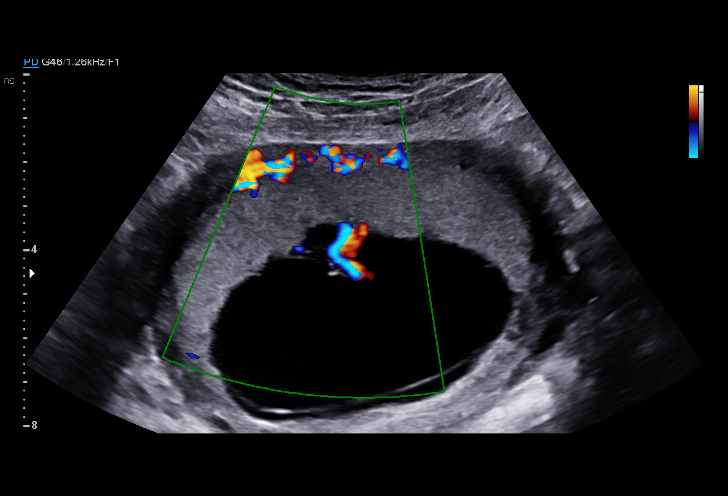
[im 16/60]
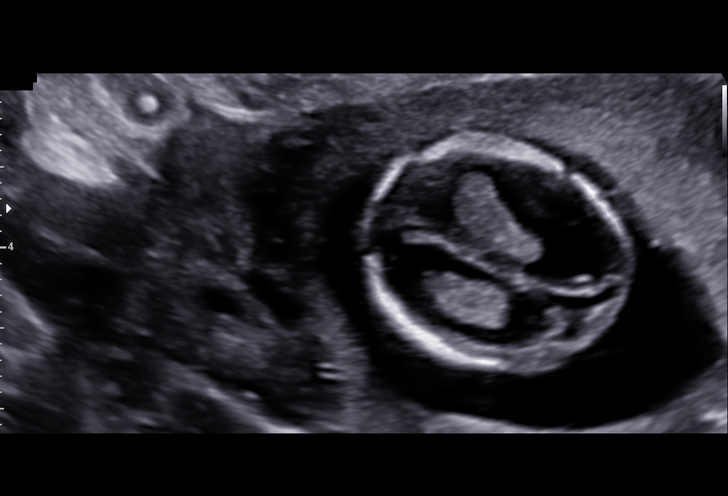
[im 20/60]
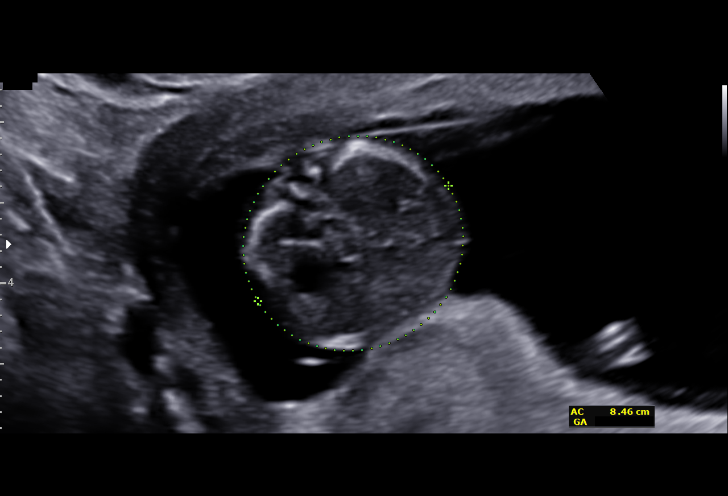
[im 25/60]
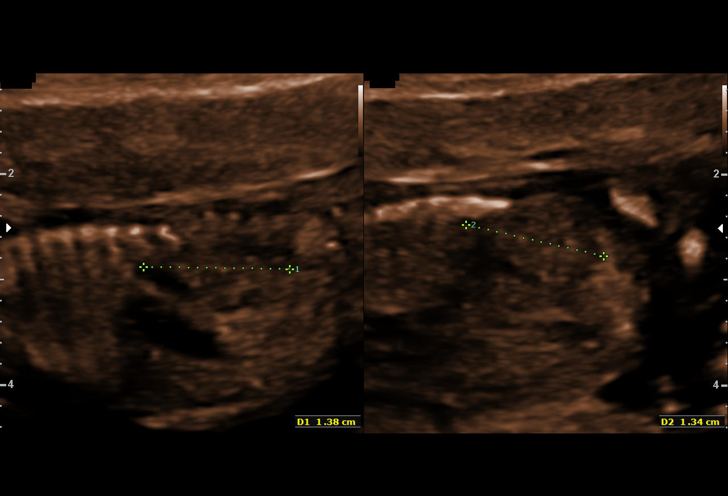
[im 29/60]
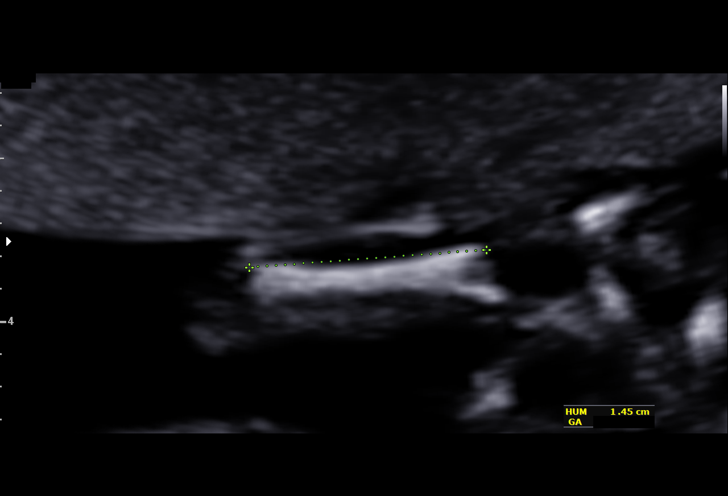
[im 33/60]
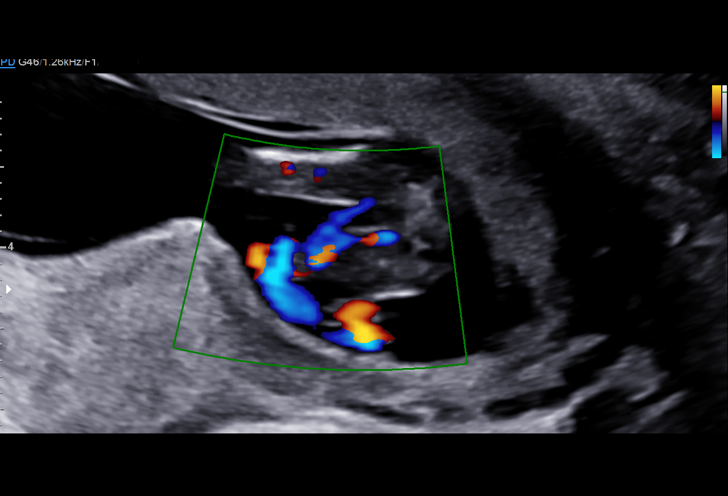
[im 38/60]
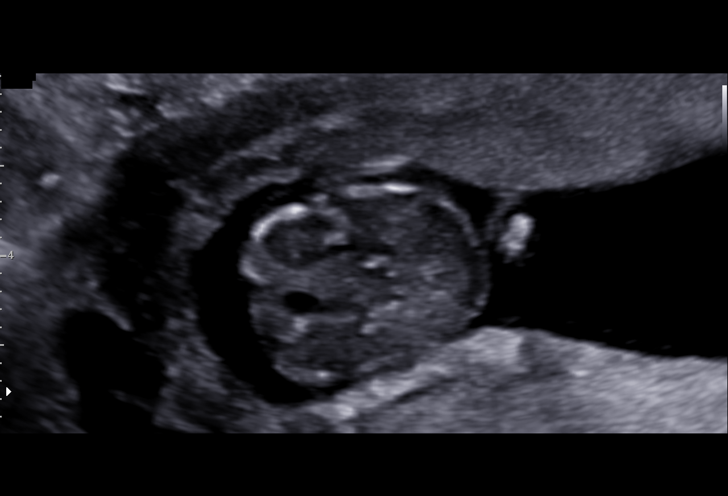
[im 42/60]
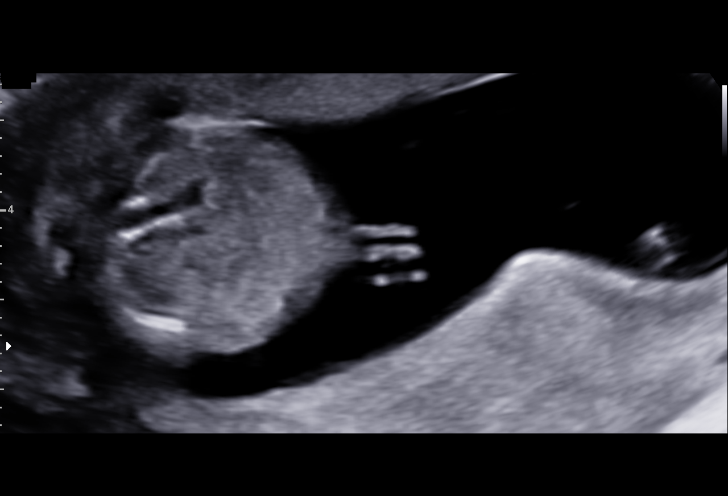
[im 46/60]
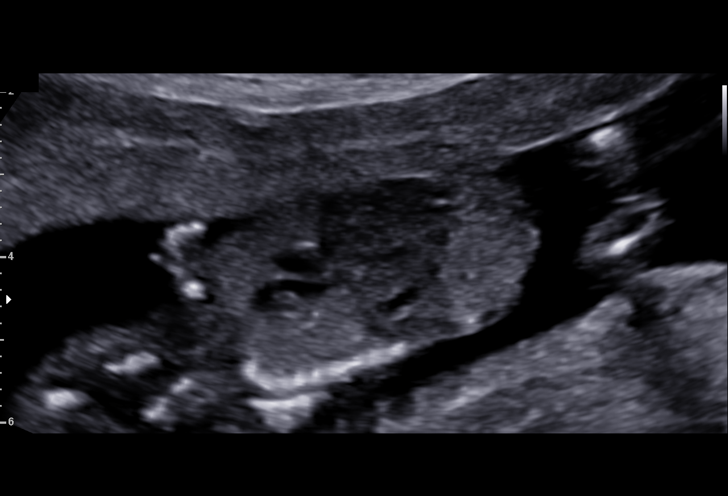
[im 51/60]
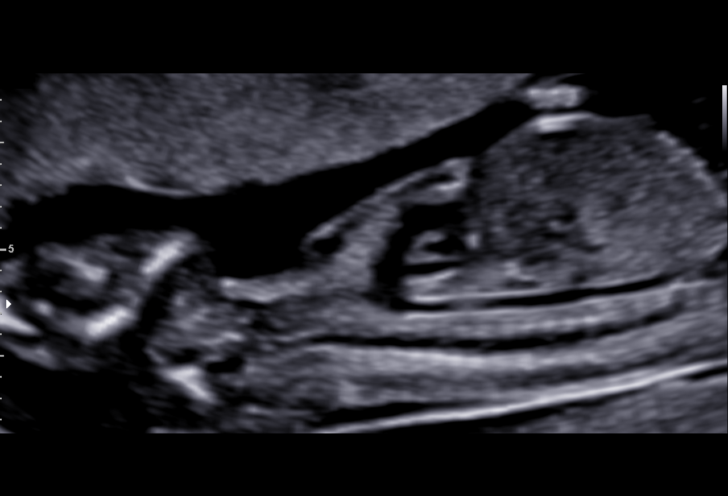
[im 55/60]
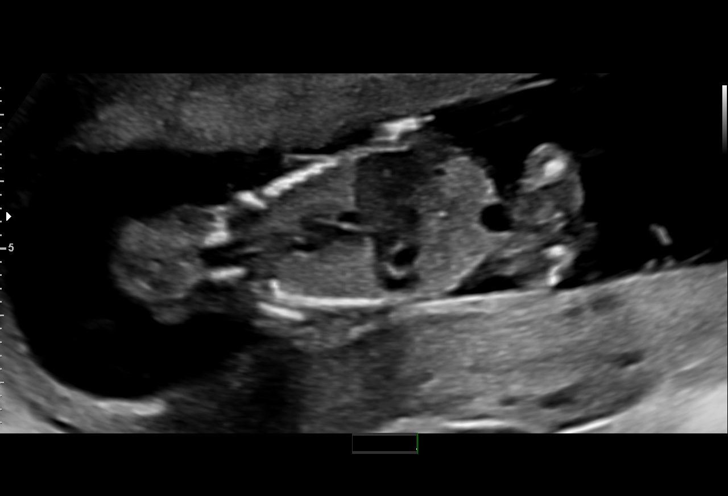
[im 60/60]
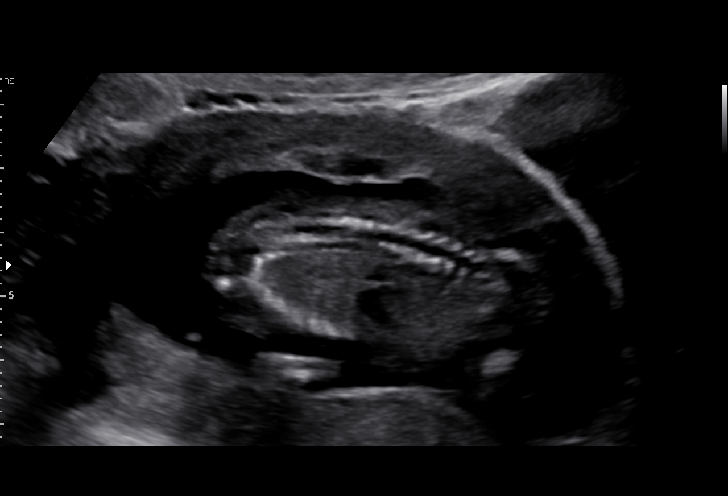

[14 of 28 positions shown; findings below may reference images not displayed]

[REDACTED]-
Faculty Physician
KARIANYELIS NP

1  KARIANYELIS           [PHONE_NUMBER]      [PHONE_NUMBER]     [PHONE_NUMBER]
Indications

14 weeks gestation of pregnancy
Encounter for antenatal screening for          [JG]
malformations
HIV affecting pregnancy, second trimester      [JG]
Hypertension - Chronic/Pre-existing            [JG]
Obesity complicating pregnancy, second         [JG]
trimester
Encounter for uncertain dates                  [JG]
OB History

Blood Type:            Height:  5'2"   Weight (lb):  238      BMI:
Gravidity:    5         Term:   4
Living:       4
Fetal Evaluation

Num Of Fetuses:     1
Fetal Heart         150
Rate(bpm):
Cardiac Activity:   Observed
Presentation:       Variable
Placenta:           Anterior, above cervical os
P. Cord Insertion:  Visualized, central
Amniotic Fluid
AFI FV:      Subjectively within normal limits

Largest Pocket(cm)
3.50
Biometry

BPD:      25.8  mm     G. Age:  14w 3d                  CI:        69.37   %   70 - 86
FL/HC:      14.0   %
HC:       98.9  mm     G. Age:  14w 4d                  HC/AC:      1.19       1.14 -
AC:       83.3  mm     G. Age:  14w 4d                  FL/BPD:     53.5   %
FL:       13.8  mm     G. Age:  14w 0d                  FL/AC:      16.6   %   20 - 24
HUM:      14.8  mm     G. Age:  14w 0d

Est. FW:      97  gm      0 lb 3 oz
Gestational Age

LMP:           19w 4d       Date:   [DATE]                 EDD:   [DATE]
U/S Today:     14w 3d                                        EDD:   [DATE]
Best:          14w 3d    Det. By:   U/S ([DATE])           EDD:   [DATE]
Anatomy

Cranium:               Appears normal         Aortic Arch:            Appears normal
Cavum:                 Not well visualized    Ductal Arch:            Appears normal
Ventricles:            Not well visualized    Diaphragm:              Appears normal
Choroid Plexus:        Appears normal         Stomach:                Appears normal, left
sided
Cerebellum:            Not well visualized    Abdomen:                Appears normal
Posterior Fossa:       Not well visualized    Abdominal Wall:         Appears nml (cord
insert, abd wall)
Nuchal Fold:           Not well visualized    Cord Vessels:           Appears normal (3
vessel cord)
Face:                  Not well visualized    Kidneys:                Appear normal
Lips:                  Not well visualized    Bladder:                Appears normal
Thoracic:              Appears normal         Spine:                  Not well visualized
Heart:                 Not well visualized    Upper Extremities:      Visualized
RVOT:                  Not well visualized    Lower Extremities:      Visualized
LVOT:                  Not well visualized
Cervix Uterus Adnexa

Cervix
Normal appearance by transabdominal scan.

Uterus
No abnormality visualized.

Left Ovary
Not visualized.

Right Ovary
Not visualized.

Adnexa:       No abnormality visualized. No adnexal mass
visualized.
Impression

Singleton intrauterine pregnancy WITH UNCERTAIN DATES
AND hiv POSITIVE
Review of the anatomy shows no sonographic markers for
aneuploidy or structural anomalies
However, ALL evaluations should be considered suboptimal
secondary to THE EARLY ega
Amniotic fluid volume is normal
Biometry shows an EGA of 14+3 weeks
Estimated fetal weight is 97g
Recommendations

Recommend using an EDC of [DATE]
Repeat scan in 4 weeks to obtain anatomic survey

## 2016-07-07 ENCOUNTER — Ambulatory Visit: Payer: Self-pay

## 2016-07-09 ENCOUNTER — Ambulatory Visit: Payer: Self-pay

## 2016-07-10 ENCOUNTER — Ambulatory Visit: Payer: Self-pay

## 2016-07-10 ENCOUNTER — Ambulatory Visit (INDEPENDENT_AMBULATORY_CARE_PROVIDER_SITE_OTHER): Payer: Medicaid Other | Admitting: Pharmacist Clinician (PhC)/ Clinical Pharmacy Specialist

## 2016-07-10 DIAGNOSIS — B20 Human immunodeficiency virus [HIV] disease: Secondary | ICD-10-CM

## 2016-07-10 LAB — CBC
HEMATOCRIT: 33.6 % — AB (ref 35.0–45.0)
HEMOGLOBIN: 10.5 g/dL — AB (ref 11.7–15.5)
MCH: 24 pg — ABNORMAL LOW (ref 27.0–33.0)
MCHC: 31.3 g/dL — AB (ref 32.0–36.0)
MCV: 76.9 fL — ABNORMAL LOW (ref 80.0–100.0)
MPV: 9.6 fL (ref 7.5–12.5)
Platelets: 257 10*3/uL (ref 140–400)
RBC: 4.37 MIL/uL (ref 3.80–5.10)
RDW: 16.2 % — ABNORMAL HIGH (ref 11.0–15.0)
WBC: 5.4 10*3/uL (ref 3.8–10.8)

## 2016-07-10 LAB — COMPLETE METABOLIC PANEL WITH GFR
ALBUMIN: 3.3 g/dL — AB (ref 3.6–5.1)
ALT: 10 U/L (ref 6–29)
AST: 8 U/L — AB (ref 10–30)
Alkaline Phosphatase: 57 U/L (ref 33–115)
BUN: 5 mg/dL — AB (ref 7–25)
CALCIUM: 8.9 mg/dL (ref 8.6–10.2)
CO2: 24 mmol/L (ref 20–31)
CREATININE: 0.44 mg/dL — AB (ref 0.50–1.10)
Chloride: 105 mmol/L (ref 98–110)
GFR, Est African American: >89 mL/min (ref >=60)
GFR, Est Non African American: >89 mL/min (ref >=60)
GLUCOSE: 74 mg/dL (ref 65–99)
POTASSIUM: 4.1 mmol/L (ref 3.5–5.3)
SODIUM: 137 mmol/L (ref 135–146)
TOTAL PROTEIN: 6.1 g/dL (ref 6.1–8.1)
Total Bilirubin: 0.3 mg/dL (ref 0.2–1.2)

## 2016-07-10 LAB — T-HELPER CELL (CD4) - (RCID CLINIC ONLY)
CD4 T CELL HELPER: 37 % (ref 33–55)
CD4 T Cell Abs: 720 /uL (ref 400–2700)

## 2016-07-10 MED ORDER — PRENATAL VITAMIN 27-0.8 MG PO TABS: 1.0000 | ORAL_TABLET | Freq: Every day | ORAL | 6 refills | Status: DC

## 2016-07-10 MED ORDER — EMTRICITABINE-TENOFOVIR DF 200-300 MG PO TABS: 1.0000 | ORAL_TABLET | Freq: Every day | ORAL | 11 refills | Status: DC

## 2016-07-10 MED ORDER — ONDANSETRON HCL 4 MG PO TABS: 4.0000 mg | ORAL_TABLET | Freq: Three times a day (TID) | ORAL | 1 refills | Status: DC | PRN

## 2016-07-10 MED ORDER — DARUNAVIR-COBICISTAT 800-150 MG PO TABS: ORAL_TABLET | ORAL | 11 refills | Status: DC

## 2016-07-10 MED FILL — PREZCOBIX 800 MG-150 MG TAB: 800-150 | 30 days supply | Qty: 30 | Fill #0

## 2016-07-10 MED FILL — ONDANSETRON HCL 4 MG TABLET: 4 | 7 days supply | Qty: 20 | Fill #0

## 2016-07-10 MED FILL — TRUVADA 200-300 MG TABS: 200-300 | 30 days supply | Qty: 30 | Fill #0

## 2016-07-10 NOTE — Progress Notes (Signed)
HPI: Catherine MccallumMarkita L Simmons is a 31 y.o. female who is currently pregnant with her 5th pregnacy.  Allergies: Allergies  Allergen Reactions  . Stadol [Butorphanol Tartrate] Itching    Tolerates percocet    Vitals:    Past Medical History: Past Medical History:  Diagnosis Date  . Cholecystitis 07/16/10  . Genital warts 2004  . Hemorrhoids   . HIV (human immunodeficiency virus infection) (HCC)   . Hx of pelvic inflammatory disease 08/31/2013   And hx of multiple STDs   . Hypertension   . Sickle cell trait (HCC)     Social History: Social History   Social History  . Marital status: Single    Spouse name: N/A  . Number of children: N/A  . Years of education: N/A   Social History Main Topics  . Smoking status: Former Smoker    Packs/day: 0.30    Types: Cigarettes    Start date: 03/11/2012  . Smokeless tobacco: Never Used  . Alcohol use Yes     Comment: occ  . Drug use: No  . Sexual activity: Yes    Partners: Male    Birth control/ protection: None   Other Topics Concern  . Not on file   Social History Narrative  . No narrative on file    Previous Regimen: Lennon Alstromriumeq, descovy, Odefsey  Current Regimen: Off  Labs: HIV 1 RNA Quant (copies/mL)  Date Value  06/11/2015 <20  04/24/2015 <20  01/23/2015 691 (H)   HIV-1 RNA Viral Load (no units)  Date Value  05/15/2014 <40  04/09/2014 <40  02/19/2014 <20   CD4 (no units)  Date Value  04/09/2014 1,016  02/19/2014 776  12/27/2013 651   CD4 T Cell Abs (/uL)  Date Value  06/11/2015 870  04/24/2015 710  01/23/2015 890   Hep B S Ab (no units)  Date Value  10/20/2012 POS (A)   Hepatitis B Surface Ag (no units)  Date Value  12/20/2014 NEGATIVE   HCV Ab (no units)  Date Value  09/28/2013 NEGATIVE    CrCl: CrCl cannot be calculated (Patient's most recent lab result is older than the maximum 21 days allowed.).  Lipids:    Component Value Date/Time   CHOL 237 12/27/2013   TRIG 61 10/20/2012 1057   HDL 50 10/20/2012 1057   CHOLHDL 2.9 10/20/2012 1057   VLDL 12 10/20/2012 1057   LDLCALC 139 12/27/2013    Assessment: Catherine Simmons is now pregnant with her 5th child? Apparently, it was confirmed at the [facility] Department. She has been off of ART since Feb. She is now approved for pregnancy medicaid. She lost insurance after leaving her previous job. 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 She was previously doing well on therapy. She has missed several appts with us to sort out the med issues but she was having a hard time getting away from work. We'll do all of her labs today. She is 20 wks 4 days into her pregnancy. She has a upcoming appt with OB in a few weeks.   In the meantime, we will restart her ART with Prezcobix and TRV today. Her medicaid is now approved because [facility] was able to run it through. She is going to pick up the meds right after this visit and start today. She is going to come back in 1 month for labs and I'll set up a visit with Dr. Drue SecondSnider at that time. I tried to find her genotypes but didn't see  much because she was previously in an ACTG study.  Recommendations:  Restart Prezcobix/TRV daily All labs today Prenatal vitamin  F/u in 4 wks  [name], PharmD, BCPS, AAHIVP, CPP Clinical Infectious Disease Pharmacist [facility] 07/10/2016, 10:26 AM

## 2016-07-10 NOTE — Patient Instructions (Addendum)
Restart your Prezcobix and Truvada today. [facility] Come back and see me on 7/3 at 0930

## 2016-07-10 NOTE — Progress Notes (Signed)
  I dont think she had baseline R ever

## 2016-07-15 LAB — HIV RNA, RTPCR W/R GT (RTI, PI,INT)
HIV-1 RNA, QN PCR: 2.73 {Log_copies}/mL — AB
HIV-1 RNA, QN PCR: 533 copies/mL — ABNORMAL HIGH

## 2016-07-19 ENCOUNTER — Inpatient Hospital Stay (HOSPITAL_COMMUNITY)
Admission: AD | Admit: 2016-07-19 | Discharge: 2016-07-19 | Disposition: A | Discharge status: Home / Self Care | Payer: Medicaid Other | Source: Ambulatory Visit | Attending: Obstetrics and Gynecology | Admitting: Obstetrics and Gynecology

## 2016-07-19 ENCOUNTER — Encounter (HOSPITAL_COMMUNITY): Payer: Self-pay

## 2016-07-19 DIAGNOSIS — Z87891 Personal history of nicotine dependence: Secondary | ICD-10-CM | POA: Insufficient documentation

## 2016-07-19 DIAGNOSIS — K219 Gastro-esophageal reflux disease without esophagitis: Secondary | ICD-10-CM | POA: Insufficient documentation

## 2016-07-19 DIAGNOSIS — O4692 Antepartum hemorrhage, unspecified, second trimester: Secondary | ICD-10-CM | POA: Insufficient documentation

## 2016-07-19 DIAGNOSIS — O9989 Other specified diseases and conditions complicating pregnancy, childbirth and the puerperium: Secondary | ICD-10-CM

## 2016-07-19 DIAGNOSIS — R071 Chest pain on breathing: Secondary | ICD-10-CM | POA: Insufficient documentation

## 2016-07-19 DIAGNOSIS — Z3A21 21 weeks gestation of pregnancy: Secondary | ICD-10-CM | POA: Diagnosis not present

## 2016-07-19 DIAGNOSIS — O98712 Human immunodeficiency virus [HIV] disease complicating pregnancy, second trimester: Secondary | ICD-10-CM | POA: Insufficient documentation

## 2016-07-19 DIAGNOSIS — O99012 Anemia complicating pregnancy, second trimester: Secondary | ICD-10-CM | POA: Insufficient documentation

## 2016-07-19 DIAGNOSIS — O10012 Pre-existing essential hypertension complicating pregnancy, second trimester: Secondary | ICD-10-CM | POA: Diagnosis not present

## 2016-07-19 DIAGNOSIS — Z885 Allergy status to narcotic agent status: Secondary | ICD-10-CM | POA: Insufficient documentation

## 2016-07-19 DIAGNOSIS — N938 Other specified abnormal uterine and vaginal bleeding: Secondary | ICD-10-CM

## 2016-07-19 DIAGNOSIS — O26892 Other specified pregnancy related conditions, second trimester: Secondary | ICD-10-CM | POA: Insufficient documentation

## 2016-07-19 DIAGNOSIS — Z21 Asymptomatic human immunodeficiency virus [HIV] infection status: Secondary | ICD-10-CM | POA: Insufficient documentation

## 2016-07-19 DIAGNOSIS — Z711 Person with feared health complaint in whom no diagnosis is made: Secondary | ICD-10-CM

## 2016-07-19 DIAGNOSIS — O2242 Hemorrhoids in pregnancy, second trimester: Secondary | ICD-10-CM | POA: Insufficient documentation

## 2016-07-19 DIAGNOSIS — K648 Other hemorrhoids: Secondary | ICD-10-CM

## 2016-07-19 LAB — WET PREP, GENITAL
SPERM: NONE SEEN
TRICH WET PREP: NONE SEEN
YEAST WET PREP: NONE SEEN

## 2016-07-19 LAB — CBC WITH DIFFERENTIAL/PLATELET
Basophils Absolute: 0 10*3/uL (ref 0.0–0.1)
Basophils Relative: 0 %
EOS ABS: 0.1 10*3/uL (ref 0.0–0.7)
EOS PCT: 2 %
HCT: 28.6 % — ABNORMAL LOW (ref 36.0–46.0)
HEMOGLOBIN: 9.8 g/dL — AB (ref 12.0–15.0)
Lymphocytes Relative: 39 %
Lymphs Abs: 2.4 10*3/uL (ref 0.7–4.0)
MCH: 25.6 pg — ABNORMAL LOW (ref 26.0–34.0)
MCHC: 34.3 g/dL (ref 30.0–36.0)
MCV: 74.7 fL — ABNORMAL LOW (ref 78.0–100.0)
Monocytes Absolute: 0.3 10*3/uL (ref 0.1–1.0)
Monocytes Relative: 5 %
NEUTROS PCT: 54 %
Neutro Abs: 3.3 10*3/uL (ref 1.7–7.7)
PLATELETS: 207 10*3/uL (ref 150–400)
RBC: 3.83 MIL/uL — AB (ref 3.87–5.11)
RDW: 15.5 % (ref 11.5–15.5)
WBC: 6.1 10*3/uL (ref 4.0–10.5)

## 2016-07-19 LAB — URINALYSIS, ROUTINE W REFLEX MICROSCOPIC
BILIRUBIN URINE: NEGATIVE
Glucose, UA: NEGATIVE mg/dL
HGB URINE DIPSTICK: NEGATIVE
KETONES UR: NEGATIVE mg/dL
Leukocytes, UA: NEGATIVE
Nitrite: NEGATIVE
PROTEIN: NEGATIVE mg/dL
Specific Gravity, Urine: 1.006 (ref 1.005–1.030)
pH: 6 (ref 5.0–8.0)

## 2016-07-19 MED ORDER — GI COCKTAIL ~~LOC~~
30.0000 mL | Freq: Once | ORAL | Status: AC
  Administered 2016-07-19: 30 mL via ORAL
  Filled 2016-07-19: qty 30

## 2016-07-19 MED ORDER — RANITIDINE HCL 150 MG PO TABS: 150.0000 mg | ORAL_TABLET | Freq: Two times a day (BID) | ORAL | 1 refills | Status: DC

## 2016-07-19 MED ORDER — FERROUS SULFATE 325 (65 FE) MG PO TABS: 325.0000 mg | ORAL_TABLET | Freq: Two times a day (BID) | ORAL | 1 refills | Status: DC

## 2016-07-19 MED ORDER — HYDROCORTISONE 2.5 % RE CREA: TOPICAL_CREAM | RECTAL | 0 refills | Status: DC

## 2016-07-19 MED ORDER — FERROUS SULFATE 324 (65 FE) MG PO TBEC: 324.0000 mg | DELAYED_RELEASE_TABLET | Freq: Two times a day (BID) | ORAL | 1 refills | Status: DC

## 2016-07-19 NOTE — Discharge Instructions (Signed)
Anemia, Nonspecific Anemia is a condition in which the concentration of red blood cells or hemoglobin in the blood is below normal. Hemoglobin is a substance in red blood cells that carries oxygen to the tissues of the body. Anemia results in not enough oxygen reaching these tissues. What are the causes? Common causes of anemia include:  Excessive bleeding. Bleeding may be internal or external. This includes excessive bleeding from periods (in women) or from the intestine.  Poor nutrition.  Chronic kidney, thyroid, and liver disease.  Bone marrow disorders that decrease red blood cell production.  Cancer and treatments for cancer.  HIV, AIDS, and their treatments.  Spleen problems that increase red blood cell destruction.  Blood disorders.  Excess destruction of red blood cells due to infection, medicines, and autoimmune disorders. What are the signs or symptoms?  Minor weakness.  Dizziness.  Headache.  Palpitations.  Shortness of breath, especially with exercise.  Paleness.  Cold sensitivity.  Indigestion.  Nausea.  Difficulty sleeping.  Difficulty concentrating. Symptoms may occur suddenly or they may develop slowly. How is this diagnosed? Additional blood tests are often needed. These help your health care provider determine the best treatment. Your health care provider will check your stool for blood and look for other causes of blood loss. How is this treated? Treatment varies depending on the cause of the anemia. Treatment can include:  Supplements of iron, vitamin B12, or folic acid.  Hormone medicines.  A blood transfusion. This may be needed if blood loss is severe.  Hospitalization. This may be needed if there is significant continual blood loss.  Dietary changes.  Spleen removal. Follow these instructions at home: Keep all follow-up appointments. It often takes many weeks to correct anemia, and having your health care provider check on your  condition and your response to treatment is very important. Get help right away if:  You develop extreme weakness, shortness of breath, or chest pain.  You become dizzy or have trouble concentrating.  You develop heavy vaginal bleeding.  You develop a rash.  You have bloody or black, tarry stools.  You faint.  You vomit up blood.  You vomit repeatedly.  You have abdominal pain.  You have a fever or persistent symptoms for more than 2-3 days.  You have a fever and your symptoms suddenly get worse.  You are dehydrated. This information is not intended to replace advice given to you by your health care provider. Make sure you discuss any questions you have with your health care provider. Document Released: 03/05/2004 Document Revised: 07/10/2015 Document Reviewed: 07/22/2012 Elsevier Interactive Patient Education  2017 Elsevier Inc.  

## 2016-07-19 NOTE — MAU Note (Signed)
Vaginal bleeding when wiped 9:30 pm, a few drops in  Toilet and some on tissue. Started feeling dizzy at 5 pm and went away.  At 9 pm felt dizzy again before noticed bleeding. Left side of Chest and left side of neck hurting for 2 days and goes straight back in to her upper back. Feels nausea too.

## 2016-07-19 NOTE — MAU Provider Note (Signed)
History     CSN: [id number]  Arrival date and time: 07/19/16 0020   First Provider Initiated Contact with Patient 07/19/16 0106      Chief Complaint  Patient presents with  . Vaginal Bleeding   HPI   Ms.Catherine Simmons is a 31 y.o. female [phone] @ 4924w6d with a history of HIV here in MAU with vaginal bleeding, chest pain and dizziness. The chest pain started 3 days ago. The chest pain is located on the left side of her chest. The pain comes and goes. The pain worsens when she breaths. She has never had this pain before. She did eat chicken tenders and a cheese burger today. Patient says she feels a little worried/anxious.   The dizziness started a few weeks ago, it wasn't bad when it started. Today the dizziness became worse. The dizziness occurs all the time. Its worse when she stands up and when she closes her eyes.   The vaginal bleeding is bright red in color. She first noticed it today. No recent sex.   Patient is non- compliant with her medications   OB History    Gravida Para Term Preterm AB Living   5 4 4  0   4   SAB TAB Ectopic Multiple Live Births         0 4      Past Medical History:  Diagnosis Date  . Cholecystitis 07/16/10  . Genital warts 2004  . Hemorrhoids   . HIV (human immunodeficiency virus infection) (HCC)   . Hx of pelvic inflammatory disease 08/31/2013   And hx of multiple STDs   . Hypertension   . Sickle cell trait [facility](HCC)     Past Surgical History:  Procedure Laterality Date  . CHOLECYSTECTOMY    . WISDOM TOOTH EXTRACTION      Family History  Problem Relation Age of Onset  . Cancer Mother   . Diabetes Maternal Aunt     Social History  Substance Use Topics  . Smoking status: Former Smoker    Packs/day: 0.30    Types: Cigarettes    Start date: 03/11/2012  . Smokeless tobacco: Never Used  . Alcohol use Yes     Comment: occ    Allergies:  Allergies  Allergen Reactions  . Stadol [Butorphanol Tartrate] Itching    Tolerates  percocet    Prescriptions Prior to Admission  Medication Sig Dispense Refill Last Dose  . emtricitabine-tenofovir (TRUVADA) 200-300 MG tablet Take 1 tablet by mouth daily. 30 tablet 11 07/18/2016 at Unknown time  . ondansetron (ZOFRAN) 4 MG tablet Take 1 tablet (4 mg total) by mouth every 8 (eight) hours as needed for nausea or vomiting. 20 tablet 1 07/18/2016 at Unknown time  . Prenatal Vit-Fe Fumarate-FA (PRENATAL VITAMIN) 27-0.8 MG TABS Take 1 tablet by mouth daily. 30 tablet 6 07/18/2016 at Unknown time  . acetaminophen (TYLENOL) 500 MG tablet Take 500 mg by mouth every 6 (six) hours as needed for mild pain, moderate pain or headache.   Taking  . amLODipine (NORVASC) 5 MG tablet Take 1 tablet (5 mg total) by mouth daily. (Patient not taking: Reported on 07/03/2016) 60 tablet 0 Not Taking  . aspirin 81 MG chewable tablet Chew 1 tablet (81 mg total) by mouth daily. Start at [redacted] wk gestation (Patient not taking: Reported on 07/03/2016) 1 tablet 0 Not Taking  . darunavir-cobicistat (PREZCOBIX) 800-150 MG tablet TAKE 1 TABLET BY MOUTH DAILY. SWALLOW WHOLE. DO NOT CRUSH, BREAK OR CHEW TABLETS.TK  WITH FOOD 30 tablet 11   . docusate sodium (COLACE) 100 MG capsule Take 1 capsule (100 mg total) by mouth every 12 (twelve) hours. (Patient not taking: Reported on 07/03/2016) 60 capsule 0 Not Taking  . Elastic Bandages & Supports (COMFORT FIT MATERNITY SUPP LG) MISC 1 Device by Does not apply route daily. 1 each 0 Taking  . ferrous sulfate 324 (65 Fe) MG TBEC Take 1 tablet (324 mg total) by mouth 2 (two) times daily after a meal. (Patient not taking: Reported on 07/03/2016) 60 tablet 0 Not Taking  . hydrocortisone (ANUSOL-HC) 2.5 % rectal cream Apply rectally 2 times daily (Patient not taking: Reported on 09/02/2015) 28 g 0 Not Taking  . labetalol (NORMODYNE) 200 MG tablet Take 1 tablet (200 mg total) by mouth 2 (two) times daily. (Patient not taking: Reported on 07/03/2016) 60 tablet 2 Not Taking  . ondansetron  (ZOFRAN-ODT) 8 MG disintegrating tablet Take 1 tablet (8 mg total) by mouth every 8 (eight) hours as needed for nausea or vomiting. (Patient not taking: Reported on 07/03/2016) 20 tablet 3 Not Taking   Results for orders placed or performed during the hospital encounter of 07/19/16 (from the past 48 hour(s))  Urinalysis, Routine w reflex microscopic     Status: Abnormal   Collection Time: 07/19/16 12:26 AM  Result Value Ref Range   Color, Urine STRAW (A) YELLOW   APPearance CLEAR CLEAR   Specific Gravity, Urine 1.006 1.005 - 1.030   pH 6.0 5.0 - 8.0   Glucose, UA NEGATIVE NEGATIVE mg/dL   Hgb urine dipstick NEGATIVE NEGATIVE   Bilirubin Urine NEGATIVE NEGATIVE   Ketones, ur NEGATIVE NEGATIVE mg/dL   Protein, ur NEGATIVE NEGATIVE mg/dL   Nitrite NEGATIVE NEGATIVE   Leukocytes, UA NEGATIVE NEGATIVE  Wet prep, genital     Status: Abnormal   Collection Time: 07/19/16  1:30 AM  Result Value Ref Range   Yeast Wet Prep HPF POC NONE SEEN NONE SEEN   Trich, Wet Prep NONE SEEN NONE SEEN   Clue Cells Wet Prep HPF POC PRESENT (A) NONE SEEN   WBC, Wet Prep HPF POC FEW (A) NONE SEEN    Comment: BACTERIA- TOO NUMEROUS TO COUNT   Sperm NONE SEEN   CBC with Differential     Status: Abnormal   Collection Time: 07/19/16  1:38 AM  Result Value Ref Range   WBC 6.1 4.0 - 10.5 K/uL   RBC 3.83 (L) 3.87 - 5.11 MIL/uL   Hemoglobin 9.8 (L) 12.0 - 15.0 g/dL   HCT 16.1 (L) 09.6 - 04.5 %   MCV 74.7 (L) 78.0 - 100.0 fL   MCH 25.6 (L) 26.0 - 34.0 pg   MCHC 34.3 30.0 - 36.0 g/dL   RDW 40.9 81.1 - 91.4 %   Platelets 207 150 - 400 K/uL   Neutrophils Relative % 54 %   Neutro Abs 3.3 1.7 - 7.7 K/uL   Lymphocytes Relative 39 %   Lymphs Abs 2.4 0.7 - 4.0 K/uL   Monocytes Relative 5 %   Monocytes Absolute 0.3 0.1 - 1.0 K/uL   Eosinophils Relative 2 %   Eosinophils Absolute 0.1 0.0 - 0.7 K/uL   Basophils Relative 0 %   Basophils Absolute 0.0 0.0 - 0.1 K/uL    Review of Systems  Cardiovascular: Positive for  chest pain.  Genitourinary: Positive for vaginal bleeding.  Neurological: Positive for dizziness.   Physical Exam   Blood pressure 132/74, pulse 87, temperature 98 F (36.7  C), temperature source Oral, resp. rate (!) 24, height 5\' 2"  (1.575 m), weight 243 lb (110.2 kg), last menstrual period 02/17/2016, SpO2 100 %, not currently breastfeeding.  Physical Exam  Constitutional: She is oriented to person, place, and time. She appears well-developed and well-nourished. No distress.  Cardiovascular: Normal rate.   Respiratory: Effort normal and breath sounds normal. No respiratory distress. She has no wheezes. She has no rales.  Genitourinary: Rectal exam shows external hemorrhoid.  Genitourinary Comments: Vagina - Small amount of white vaginal discharge, no odor  Cervix - No contact bleeding, no active bleeding  Bimanual exam: Cervix closed Uterus non tender, normal size Adnexa non tender, no masses bilaterally GC/Chlam, wet prep done Chaperone present for exam.   Musculoskeletal: Normal range of motion.  Neurological: She is alert and oriented to person, place, and time.  Skin: Skin is warm. She is not diaphoretic.  Psychiatric: Her behavior is normal.    MAU Course  Procedures  None  MDM  Pulse ox 100% on RA.  Wet prep & GC UA Orthostatic vital signs WNL  + fetal heart tones via doppler  EKG WNL Patient feeling better after GI cocktail  Assessment and Plan   A:  1. Chest pain on breathing   2. Physically well but worried   3. Gastroesophageal reflux disease, esophagitis presence not specified   4. Anemia in pregnancy, second trimester   5. Other hemorrhoids        P:  Discharge home in stable condition Rx: Zantac Patient has RX for iron,  anusol, ASA, norvasc and labetalol. Patient encouraged to take medication as prescribed Avoid fried foods, Increase PO fluid intake Return to MAU if symptoms worsen   Catherine Carbon I, NP 07/19/2016 2:58 AM

## 2016-07-20 LAB — GC/CHLAMYDIA PROBE AMP (~~LOC~~) NOT AT ARMC
Chlamydia: NEGATIVE
Neisseria Gonorrhea: NEGATIVE

## 2016-07-20 MED FILL — raNITIdine HCL 150 MG TABS: 150 | 30 days supply | Qty: 60 | Fill #0

## 2016-07-22 ENCOUNTER — Encounter: Payer: Self-pay | Admitting: *Deleted

## 2016-07-22 ENCOUNTER — Ambulatory Visit (INDEPENDENT_AMBULATORY_CARE_PROVIDER_SITE_OTHER): Payer: Medicaid Other | Admitting: Obstetrics and Gynecology

## 2016-07-22 ENCOUNTER — Encounter: Payer: Self-pay | Admitting: Obstetrics and Gynecology

## 2016-07-22 ENCOUNTER — Encounter: Payer: Self-pay | Admitting: Family Medicine

## 2016-07-22 VITALS — BP 125/72 | HR 90 | Wt 240.0 lb

## 2016-07-22 DIAGNOSIS — Z21 Asymptomatic human immunodeficiency virus [HIV] infection status: Secondary | ICD-10-CM | POA: Diagnosis not present

## 2016-07-22 DIAGNOSIS — O0992 Supervision of high risk pregnancy, unspecified, second trimester: Secondary | ICD-10-CM | POA: Diagnosis not present

## 2016-07-22 DIAGNOSIS — O98712 Human immunodeficiency virus [HIV] disease complicating pregnancy, second trimester: Secondary | ICD-10-CM

## 2016-07-22 DIAGNOSIS — I10 Essential (primary) hypertension: Secondary | ICD-10-CM

## 2016-07-22 DIAGNOSIS — O99212 Obesity complicating pregnancy, second trimester: Secondary | ICD-10-CM | POA: Diagnosis not present

## 2016-07-22 DIAGNOSIS — O99011 Anemia complicating pregnancy, first trimester: Secondary | ICD-10-CM | POA: Diagnosis not present

## 2016-07-22 DIAGNOSIS — Z6841 Body Mass Index (BMI) 40.0 and over, adult: Secondary | ICD-10-CM | POA: Diagnosis not present

## 2016-07-22 DIAGNOSIS — D573 Sickle-cell trait: Secondary | ICD-10-CM | POA: Diagnosis not present

## 2016-07-22 DIAGNOSIS — Z8759 Personal history of other complications of pregnancy, childbirth and the puerperium: Secondary | ICD-10-CM | POA: Insufficient documentation

## 2016-07-22 DIAGNOSIS — E669 Obesity, unspecified: Secondary | ICD-10-CM

## 2016-07-22 DIAGNOSIS — O10912 Unspecified pre-existing hypertension complicating pregnancy, second trimester: Secondary | ICD-10-CM | POA: Diagnosis not present

## 2016-07-22 DIAGNOSIS — Z8619 Personal history of other infectious and parasitic diseases: Secondary | ICD-10-CM | POA: Insufficient documentation

## 2016-07-22 DIAGNOSIS — O0990 Supervision of high risk pregnancy, unspecified, unspecified trimester: Secondary | ICD-10-CM | POA: Insufficient documentation

## 2016-07-22 DIAGNOSIS — O98719 Human immunodeficiency virus [HIV] disease complicating pregnancy, unspecified trimester: Secondary | ICD-10-CM

## 2016-07-22 DIAGNOSIS — O99210 Obesity complicating pregnancy, unspecified trimester: Secondary | ICD-10-CM

## 2016-07-22 LAB — POCT URINALYSIS DIP (DEVICE)
BILIRUBIN URINE: NEGATIVE
GLUCOSE, UA: NEGATIVE mg/dL
Hgb urine dipstick: NEGATIVE
KETONES UR: NEGATIVE mg/dL
NITRITE: NEGATIVE
PH: 5.5 (ref 5.0–8.0)
Protein, ur: NEGATIVE mg/dL
Specific Gravity, Urine: 1.01 (ref 1.005–1.030)
Urobilinogen, UA: 0.2 mg/dL (ref 0.0–1.0)

## 2016-07-22 LAB — GLUCOSE TOLERANCE, 1 HOUR: GLUCOSE 1 HOUR GTT: 95

## 2016-07-22 MED ORDER — ASPIRIN 81 MG PO CHEW: 81.0000 mg | CHEWABLE_TABLET | Freq: Every day | ORAL | 3 refills | Status: DC

## 2016-07-22 MED ORDER — ONDANSETRON 8 MG PO TBDP: 8.0000 mg | ORAL_TABLET | Freq: Three times a day (TID) | ORAL | 1 refills | Status: DC | PRN

## 2016-07-22 MED FILL — ONDANSETRON ODT 8 MG TABLET: 8 | 13 days supply | Qty: 40 | Fill #0
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
## 2016-07-25 LAB — AFP TETRA
DIA Mom Value: 0.59
DIA Value (EIA): 75.52 pg/mL
DSR (BY AGE) 1 IN: 587
DSR (Second Trimester) 1 IN: 10000
Gestational Age: 17.1 WEEKS
MSAFP Mom: 0.94
MSAFP: 29.7 ng/mL
MSHCG MOM: 1.14
MSHCG: 26343 m[IU]/mL
Maternal Age At EDD: 31.3 yr
Osb Risk: 10000
TEST RESULTS AFP: NEGATIVE
UE3 VALUE: 1.12 ng/mL
Weight: 240 [lb_av]
uE3 Mom: 1.24

## 2016-07-25 LAB — HIV-1 GENOTYPE

## 2016-07-25 LAB — PROTEIN / CREATININE RATIO, URINE
CREATININE, UR: 41.3 mg/dL
Protein, Ur: 6.1 mg/dL
Protein/Creat Ratio: 148 mg/g creat (ref 0–200)

## 2016-07-25 LAB — COMPREHENSIVE METABOLIC PANEL
A/G RATIO: 1.2 (ref 1.2–2.2)
ALBUMIN: 3.4 g/dL — AB (ref 3.5–5.5)
ALK PHOS: 65 IU/L (ref 39–117)
ALT: 8 IU/L (ref 0–32)
AST: 11 IU/L (ref 0–40)
BUN / CREAT RATIO: 12 (ref 9–23)
BUN: 7 mg/dL (ref 6–20)
Bilirubin Total: <0.2 mg/dL (ref 0.0–1.2)
CO2: 19 mmol/L — ABNORMAL LOW (ref 20–29)
Calcium: 8.8 mg/dL (ref 8.7–10.2)
Chloride: 103 mmol/L (ref 96–106)
Creatinine, Ser: 0.57 mg/dL (ref 0.57–1.00)
GFR calc non Af Amer: 125 mL/min/{1.73_m2} (ref >59)
GFR, EST AFRICAN AMERICAN: 144 mL/min/{1.73_m2} (ref >59)
GLOBULIN, TOTAL: 2.8 g/dL (ref 1.5–4.5)
Glucose: 81 mg/dL (ref 65–99)
Potassium: 4.2 mmol/L (ref 3.5–5.2)
SODIUM: 136 mmol/L (ref 134–144)
TOTAL PROTEIN: 6.2 g/dL (ref 6.0–8.5)

## 2016-07-28 LAB — RFLX HIV-1 INTEGRASE GENOTYPE

## 2016-07-30 ENCOUNTER — Encounter (HOSPITAL_COMMUNITY): Payer: Self-pay

## 2016-07-30 ENCOUNTER — Other Ambulatory Visit (HOSPITAL_COMMUNITY): Payer: Self-pay | Admitting: Obstetrics and Gynecology

## 2016-07-30 ENCOUNTER — Ambulatory Visit (HOSPITAL_COMMUNITY)
Admission: RE | Admit: 2016-07-30 | Discharge: 2016-07-30 | Disposition: A | Discharge status: Home / Self Care | Payer: Medicaid Other | Source: Ambulatory Visit | Attending: Nurse Practitioner | Admitting: Nurse Practitioner

## 2016-07-30 ENCOUNTER — Other Ambulatory Visit: Payer: Self-pay | Admitting: Pharmacist Clinician (PhC)/ Clinical Pharmacy Specialist

## 2016-07-30 DIAGNOSIS — O98512 Other viral diseases complicating pregnancy, second trimester: Secondary | ICD-10-CM

## 2016-07-30 DIAGNOSIS — Z3A18 18 weeks gestation of pregnancy: Secondary | ICD-10-CM

## 2016-07-30 DIAGNOSIS — O98719 Human immunodeficiency virus [HIV] disease complicating pregnancy, unspecified trimester: Secondary | ICD-10-CM

## 2016-07-30 DIAGNOSIS — O99342 Other mental disorders complicating pregnancy, second trimester: Secondary | ICD-10-CM

## 2016-07-30 DIAGNOSIS — O162 Unspecified maternal hypertension, second trimester: Secondary | ICD-10-CM

## 2016-07-30 DIAGNOSIS — B009 Herpesviral infection, unspecified: Secondary | ICD-10-CM

## 2016-07-30 DIAGNOSIS — O09892 Supervision of other high risk pregnancies, second trimester: Secondary | ICD-10-CM

## 2016-07-30 DIAGNOSIS — Z0489 Encounter for examination and observation for other specified reasons: Secondary | ICD-10-CM

## 2016-07-30 DIAGNOSIS — Z363 Encounter for antenatal screening for malformations: Secondary | ICD-10-CM

## 2016-07-30 DIAGNOSIS — O99212 Obesity complicating pregnancy, second trimester: Secondary | ICD-10-CM

## 2016-07-30 DIAGNOSIS — IMO0002 Reserved for concepts with insufficient information to code with codable children: Secondary | ICD-10-CM

## 2016-07-30 IMAGING — US US MFM OB FOLLOW-UP
1 series · 13 of 28 positions shown · non-contrast
Comparison: none

[Series 1: us mfm ob follow-up · 124 acquisitions, 13 frames shown]
[im 5/124]
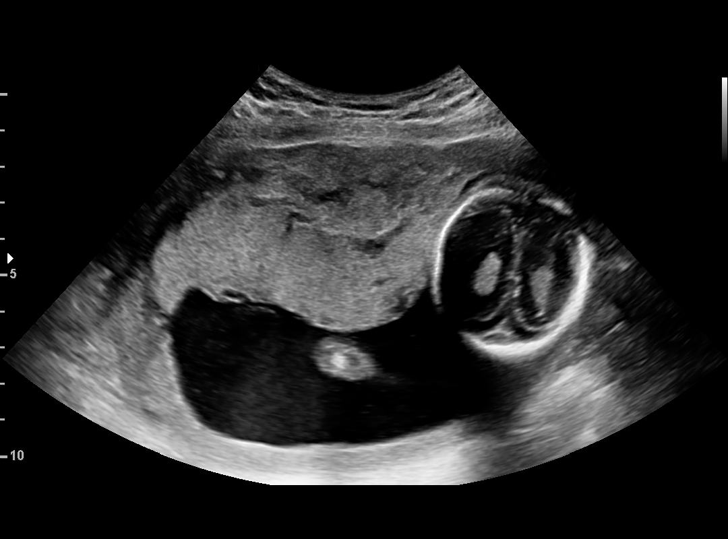
[im 14/124]
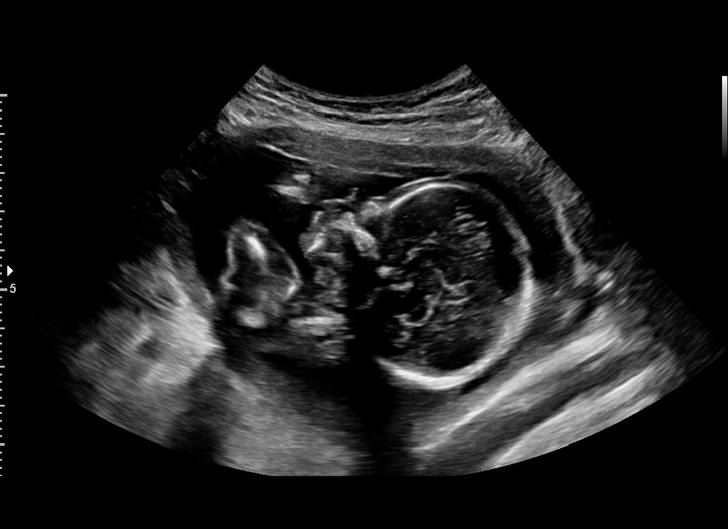
[im 23/124]
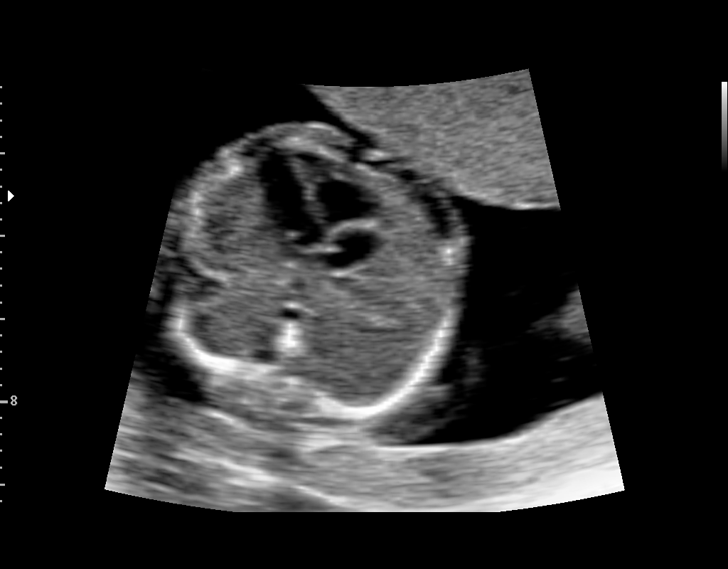
[im 32/124]
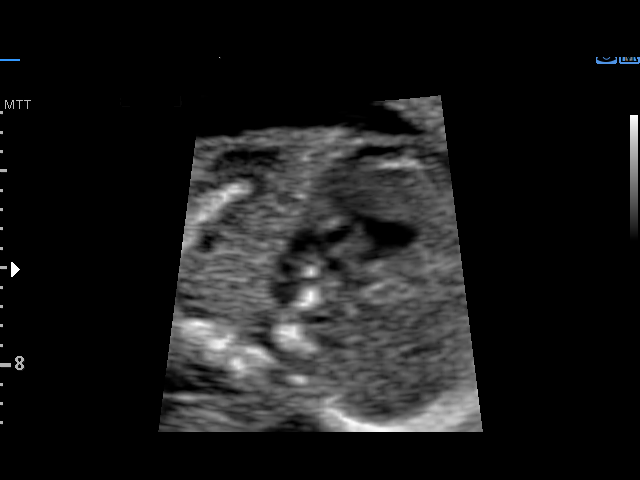
[im 42/124]
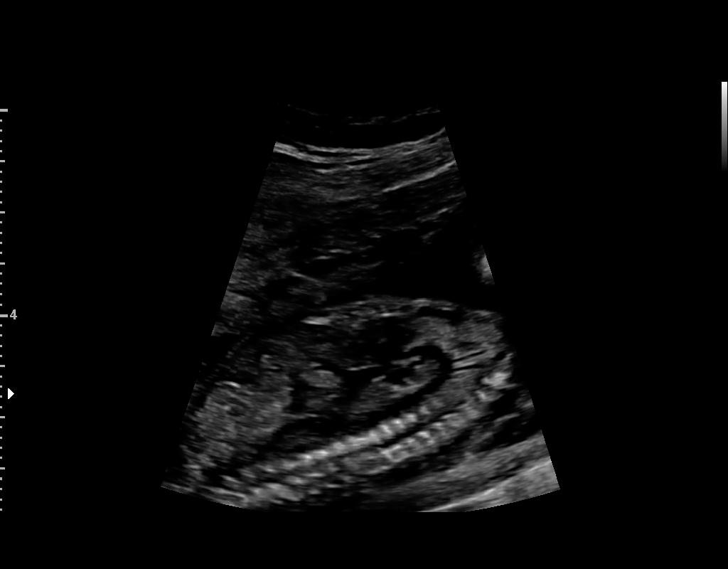
[im 51/124]
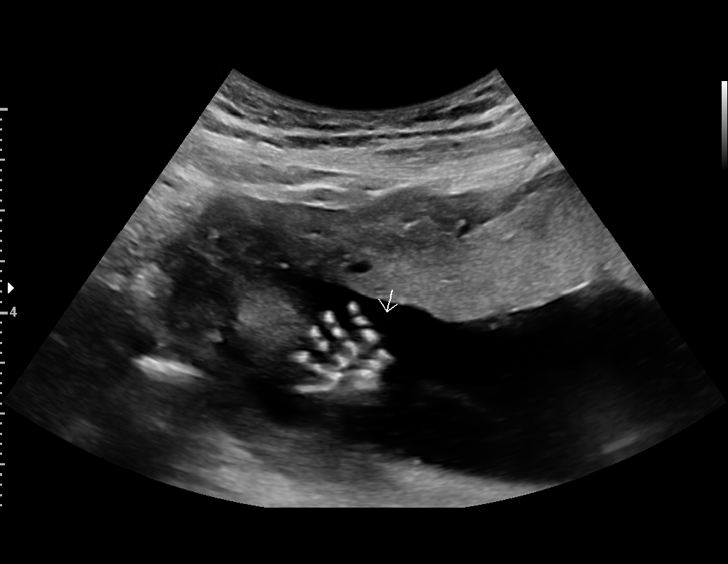
[im 64/124]
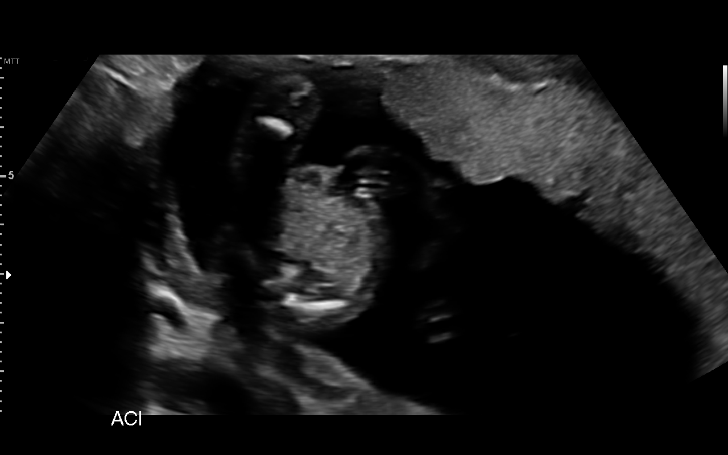
[im 73/124]
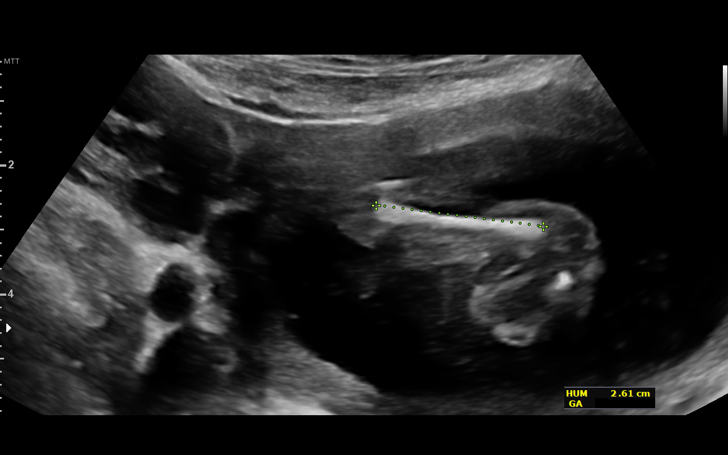
[im 83/124]
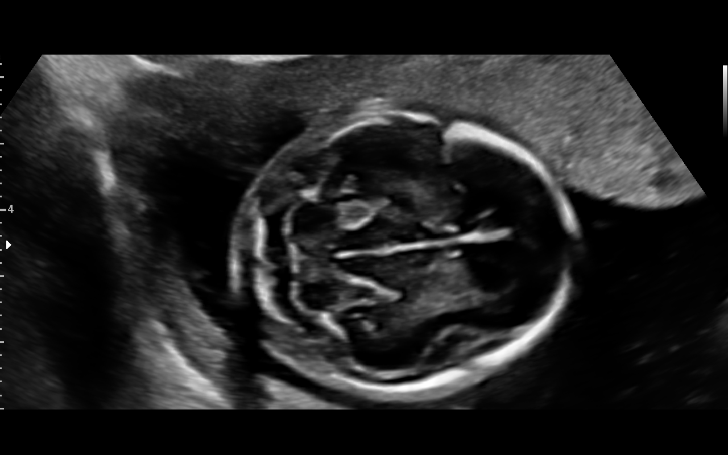
[im 92/124]
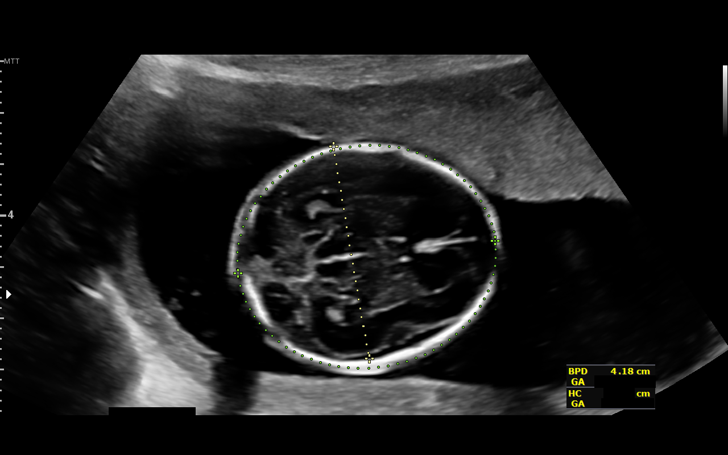
[im 101/124]
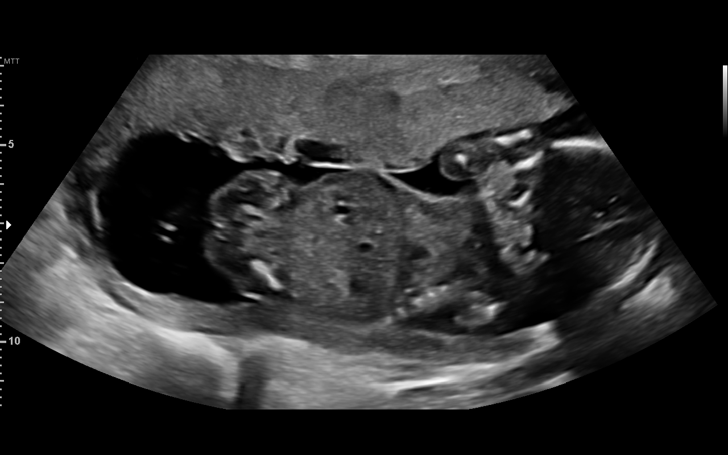
[im 110/124]
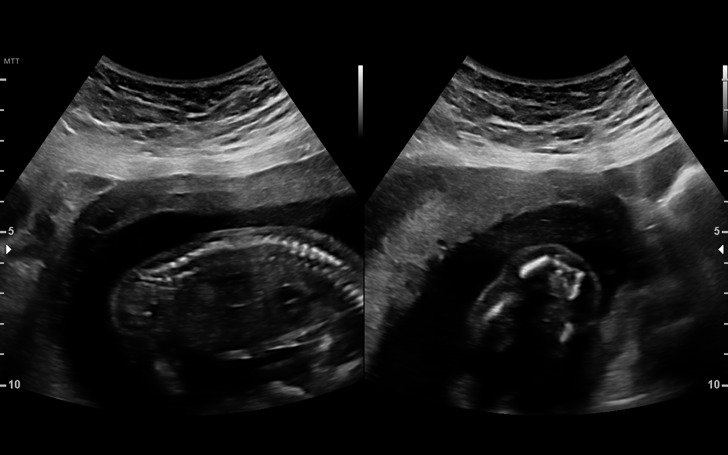
[im 119/124]
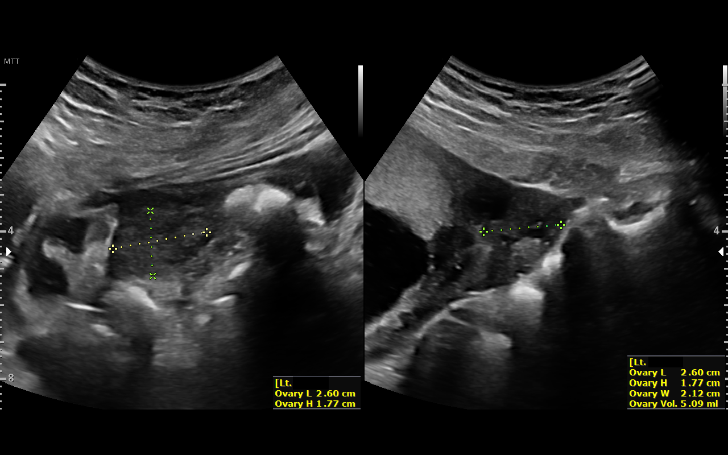

[13 of 28 positions shown; findings below may reference images not displayed]

OB/Gyn Clinic

1  LA[facility]              [PHONE_NUMBER]      [PHONE_NUMBER]     [PHONE_NUMBER]
Indications

18 weeks gestation of pregnancy
HIV affecting pregnancy, second trimester      [9N]
Hypertension - Chronic/Pre-existing            [9N]
Obesity complicating pregnancy, second         [9N]
trimester
Poor obstetric history: Previous               [9N]
preeclampsia / eclampsia/gestational HTN
Other mental disorder complicating             [9N]
pregnancy, unspecified trimester
Encounter for other antenatal screening        [9N]
follow-up
OB History

Blood Type:            Height:  5'2"   Weight (lb):  238      BMI:
Gravidity:    5         Term:   4
Living:       4
Fetal Evaluation

Num Of Fetuses:     1
Fetal Heart         158
Rate(bpm):
Cardiac Activity:   Observed
Presentation:       Cephalic
Placenta:           Anterior, above cervical os
P. Cord Insertion:  Visualized, central

Amniotic Fluid
AFI FV:      Subjectively within normal limits

Largest Pocket(cm)
4.67
Biometry

BPD:      41.9  mm     G. Age:  18w 5d         69  %    CI:        79.72   %   70 - 86
FL/HC:      18.0   %   15.8 - 18
HC:      148.3  mm     G. Age:  18w 0d         26  %    HC/AC:      1.13       1.07 -
AC:      131.8  mm     G. Age:  18w 5d         60  %    FL/BPD:     63.7   %
FL:       26.7  mm     G. Age:  18w 1d         39  %    FL/AC:      20.3   %   20 - 24
HUM:      25.6  mm     G. Age:  18w 0d         47  %
CER:      18.9  mm     G. Age:  18w 3d         55  %
NFT:       3.8  mm
CM:        3.8  mm

Est. FW:     238  gm      0 lb 8 oz     50  %
Gestational Age

LMP:           23w 3d       Date:   [DATE]                 EDD:   [DATE]
U/S Today:     18w 3d                                        EDD:   [DATE]
Best:          18w 2d    Det. By:   U/S  ([DATE])          EDD:   [DATE]
Anatomy

Cranium:               Appears normal         Aortic Arch:            Appears normal
Cavum:                 Appears normal         Ductal Arch:            Appears normal
Ventricles:            Appears normal         Diaphragm:              Appears normal
Choroid Plexus:        Appears normal         Stomach:                Appears normal, left
sided
Cerebellum:            Appears normal         Abdomen:                Appears normal
Posterior Fossa:       Appears normal         Abdominal Wall:         Appears nml (cord
insert, abd wall)
Nuchal Fold:           Appears normal         Cord Vessels:           Appears normal (3
vessel cord)
Face:                  Appears normal         Kidneys:                Appear normal
(orbits and profile)
Lips:                  Appears normal         Bladder:                Appears normal
Thoracic:              Appears normal         Spine:                  Appears normal
Heart:                 Appears normal         Upper Extremities:      Appears normal
(4CH, axis, and situs
RVOT:                  Appears normal         Lower Extremities:      Appears normal
LVOT:                  Appears normal

Other:  Fetus appears to be a female. Heels and 5th digit visualized. Nasal
bone visualized. Technically difficult due to maternal habitus.
Cervix Uterus Adnexa

Cervix
Length:           3.32  cm.
Normal appearance by transabdominal scan.
Uterus
No abnormality visualized.

Left Ovary
Size(cm)       2.6 x    2.12   x  1.77      Vol(ml):
Within normal limits. No adnexal mass visualized.

Right Ovary
Size(cm)     2.06  x    2.34   x  1.09      Vol(ml):
Within normal limits. No adnexal mass visualized.

Cul De Sac:   No free fluid seen.

Adnexa:       No abnormality visualized.
Impression

SIUP at 18+2 weeks
Normal detailed fetal anatomy
Markers of aneuploidy: none
Normal amniotic fluid volume
Measurements consistent with prior US
Recommendations

Follow-up ultrasound for growth in 6 weeks

## 2016-07-30 MED ORDER — RALTEGRAVIR POTASSIUM 600 MG PO TABS: 2.0000 | ORAL_TABLET | Freq: Every day | ORAL | 11 refills | Status: DC

## 2016-07-30 NOTE — Progress Notes (Signed)
Catherine AbbeyMarkita saw me a couple of weeks ago due to pregnancy. She was restarted on Prezcobix/Truvada because she was on that regimen in the past for pregnancy. New data has been released for the use of Prezcobix during pregnancy could lead to lower drug level. She has been tried on ATV in the past and had severe nausea with it. Gave her the option of BID DRV/r or ISN, she chose to be ISN/Truvada. [facility]. She will stop the Prezcobix.

## 2016-07-31 ENCOUNTER — Ambulatory Visit (HOSPITAL_COMMUNITY)
Admission: RE | Admit: 2016-07-31 | Discharge: 2016-07-31 | Disposition: A | Discharge status: Home / Self Care | Payer: Medicaid Other | Source: Ambulatory Visit | Attending: Nurse Practitioner | Admitting: Nurse Practitioner

## 2016-07-31 ENCOUNTER — Other Ambulatory Visit (HOSPITAL_COMMUNITY): Payer: Self-pay | Admitting: *Deleted

## 2016-07-31 DIAGNOSIS — O98712 Human immunodeficiency virus [HIV] disease complicating pregnancy, second trimester: Secondary | ICD-10-CM

## 2016-08-05 ENCOUNTER — Encounter: Payer: Self-pay | Admitting: Family Medicine

## 2016-08-05 DIAGNOSIS — O99340 Other mental disorders complicating pregnancy, unspecified trimester: Secondary | ICD-10-CM | POA: Insufficient documentation

## 2016-08-05 DIAGNOSIS — O98719 Human immunodeficiency virus [HIV] disease complicating pregnancy, unspecified trimester: Secondary | ICD-10-CM | POA: Insufficient documentation

## 2016-08-05 DIAGNOSIS — F329 Major depressive disorder, single episode, unspecified: Secondary | ICD-10-CM | POA: Insufficient documentation

## 2016-08-11 ENCOUNTER — Ambulatory Visit: Payer: Self-pay

## 2016-08-11 ENCOUNTER — Telehealth: Payer: Self-pay | Admitting: Pharmacist Clinician (PhC)/ Clinical Pharmacy Specialist

## 2016-08-11 NOTE — Telephone Encounter (Signed)
Catherine Simmons missed our appt this AM. She said that she has some phone trouble so she forgot. Rescheduled her for next wed but she has an OBGYN appt so I moved it to Thursday.

## 2016-08-18 ENCOUNTER — Inpatient Hospital Stay (HOSPITAL_COMMUNITY)
Admission: AD | Admit: 2016-08-18 | Discharge: 2016-08-18 | Disposition: A | Discharge status: Home / Self Care | Payer: Medicaid Other | Source: Ambulatory Visit | Attending: Family Medicine | Admitting: Family Medicine

## 2016-08-18 ENCOUNTER — Encounter (HOSPITAL_COMMUNITY): Payer: Self-pay | Admitting: *Deleted

## 2016-08-18 DIAGNOSIS — O98712 Human immunodeficiency virus [HIV] disease complicating pregnancy, second trimester: Secondary | ICD-10-CM | POA: Insufficient documentation

## 2016-08-18 DIAGNOSIS — D573 Sickle-cell trait: Secondary | ICD-10-CM | POA: Diagnosis not present

## 2016-08-18 DIAGNOSIS — O10912 Unspecified pre-existing hypertension complicating pregnancy, second trimester: Secondary | ICD-10-CM | POA: Insufficient documentation

## 2016-08-18 DIAGNOSIS — O23592 Infection of other part of genital tract in pregnancy, second trimester: Secondary | ICD-10-CM | POA: Insufficient documentation

## 2016-08-18 DIAGNOSIS — O26892 Other specified pregnancy related conditions, second trimester: Secondary | ICD-10-CM | POA: Diagnosis not present

## 2016-08-18 DIAGNOSIS — R109 Unspecified abdominal pain: Secondary | ICD-10-CM

## 2016-08-18 DIAGNOSIS — B9689 Other specified bacterial agents as the cause of diseases classified elsewhere: Secondary | ICD-10-CM | POA: Diagnosis not present

## 2016-08-18 DIAGNOSIS — Z87891 Personal history of nicotine dependence: Secondary | ICD-10-CM | POA: Insufficient documentation

## 2016-08-18 DIAGNOSIS — N760 Acute vaginitis: Secondary | ICD-10-CM | POA: Diagnosis not present

## 2016-08-18 DIAGNOSIS — Z21 Asymptomatic human immunodeficiency virus [HIV] infection status: Secondary | ICD-10-CM | POA: Diagnosis not present

## 2016-08-18 DIAGNOSIS — Z8249 Family history of ischemic heart disease and other diseases of the circulatory system: Secondary | ICD-10-CM | POA: Insufficient documentation

## 2016-08-18 DIAGNOSIS — Z8379 Family history of other diseases of the digestive system: Secondary | ICD-10-CM | POA: Diagnosis not present

## 2016-08-18 DIAGNOSIS — Z3A21 21 weeks gestation of pregnancy: Secondary | ICD-10-CM | POA: Diagnosis not present

## 2016-08-18 DIAGNOSIS — R1010 Upper abdominal pain, unspecified: Secondary | ICD-10-CM | POA: Diagnosis not present

## 2016-08-18 DIAGNOSIS — Z833 Family history of diabetes mellitus: Secondary | ICD-10-CM | POA: Insufficient documentation

## 2016-08-18 DIAGNOSIS — Z888 Allergy status to other drugs, medicaments and biological substances status: Secondary | ICD-10-CM | POA: Insufficient documentation

## 2016-08-18 DIAGNOSIS — O99012 Anemia complicating pregnancy, second trimester: Secondary | ICD-10-CM | POA: Diagnosis not present

## 2016-08-18 LAB — URINALYSIS, ROUTINE W REFLEX MICROSCOPIC
BILIRUBIN URINE: NEGATIVE
Glucose, UA: NEGATIVE mg/dL
Hgb urine dipstick: NEGATIVE
Ketones, ur: NEGATIVE mg/dL
Leukocytes, UA: NEGATIVE
NITRITE: NEGATIVE
PH: 5 (ref 5.0–8.0)
Protein, ur: NEGATIVE mg/dL
SPECIFIC GRAVITY, URINE: 1.008 (ref 1.005–1.030)

## 2016-08-18 LAB — WET PREP, GENITAL
SPERM: NONE SEEN
Trich, Wet Prep: NONE SEEN
Yeast Wet Prep HPF POC: NONE SEEN

## 2016-08-18 LAB — COMPREHENSIVE METABOLIC PANEL
ALBUMIN: 2.7 g/dL — AB (ref 3.5–5.0)
ALK PHOS: 55 U/L (ref 38–126)
ALT: 9 U/L — ABNORMAL LOW (ref 14–54)
AST: 10 U/L — AB (ref 15–41)
Anion gap: 6 (ref 5–15)
BILIRUBIN TOTAL: 0.4 mg/dL (ref 0.3–1.2)
BUN: 6 mg/dL (ref 6–20)
CALCIUM: 8.2 mg/dL — AB (ref 8.9–10.3)
CO2: 23 mmol/L (ref 22–32)
CREATININE: 0.47 mg/dL (ref 0.44–1.00)
Chloride: 106 mmol/L (ref 101–111)
GFR calc Af Amer: >60 mL/min (ref >60)
GLUCOSE: 88 mg/dL (ref 65–99)
Potassium: 3.5 mmol/L (ref 3.5–5.1)
Sodium: 135 mmol/L (ref 135–145)
TOTAL PROTEIN: 6.1 g/dL — AB (ref 6.5–8.1)

## 2016-08-18 LAB — CBC
HEMATOCRIT: 27.7 % — AB (ref 36.0–46.0)
HEMOGLOBIN: 9.3 g/dL — AB (ref 12.0–15.0)
MCH: 25.1 pg — ABNORMAL LOW (ref 26.0–34.0)
MCHC: 33.6 g/dL (ref 30.0–36.0)
MCV: 74.9 fL — AB (ref 78.0–100.0)
Platelets: 209 10*3/uL (ref 150–400)
RBC: 3.7 MIL/uL — ABNORMAL LOW (ref 3.87–5.11)
RDW: 14.5 % (ref 11.5–15.5)
WBC: 5.8 10*3/uL (ref 4.0–10.5)

## 2016-08-18 LAB — PROTEIN / CREATININE RATIO, URINE
Creatinine, Urine: 62 mg/dL
Total Protein, Urine: <6 mg/dL

## 2016-08-18 LAB — LIPASE, BLOOD: Lipase: 24 U/L (ref 11–51)

## 2016-08-18 LAB — POCT FERN TEST: POCT FERN TEST: NEGATIVE

## 2016-08-18 MED ORDER — METRONIDAZOLE 500 MG PO TABS: 500.0000 mg | ORAL_TABLET | Freq: Two times a day (BID) | ORAL | 0 refills | Status: DC

## 2016-08-18 MED ORDER — GI COCKTAIL ~~LOC~~
30.0000 mL | Freq: Once | ORAL | Status: AC
  Administered 2016-08-18: 30 mL via ORAL
  Filled 2016-08-18: qty 30

## 2016-08-18 MED FILL — ISENTRESS HD 600 MG TAB: 600 | 30 days supply | Qty: 60 | Fill #0

## 2016-08-18 MED FILL — metroNIDAZOLE 500 MG TABS: 500 | 7 days supply | Qty: 14 | Fill #0

## 2016-08-18 MED FILL — TRUVADA 200-300 MG TABS: 200-300 | 30 days supply | Qty: 30 | Fill #1

## 2016-08-18 NOTE — MAU Note (Signed)
+  upper abdominal pain Started around 4am Intermittent Rating pain 8/10 Took tylenol at 4 this morning Patient tearful in triage  ?LOF  Clear States when woke up her panties were wet

## 2016-08-18 NOTE — MAU Provider Note (Signed)
History     CSN: [id number]  Arrival date and time: 08/18/16 [id number]  First Provider Initiated Contact with Patient 08/18/16 [phone]      Chief Complaint  Patient presents with  . Abdominal Pain  . Rupture of Membranes   HPI Catherine Simmons is a 31 y.o. G5P4004 at 6939w0d who presents with abdominal pain & vaginal discharge. Symptoms began this morning. Reports upper abdominal pain that occurs every 3-5 minutes. Pain does not radiate but dose reports abdominal tightening associated with pain. Rates pain 8/10. Has not treated. Nothing makes better or worse. Also reports thin clear discharge that left her underwear wet this morning; has not noted continued leaking. Endorses headache since yesterday; has taken tylenol with moderate relief.  Hx of chronic hypertension; had baseline labs with ob/gyn last month & is currently not taking antihypertensives. Has had gall bladder removed. No history of preterm labor/delivery.  Nausea + Denies vomiting, diarrhea, constipation, fever/chills, or vaginal bleeding.   OB History    Gravida Para Term Preterm AB Living   5 4 4  0   4   SAB TAB Ectopic Multiple Live Births         0 4      Past Medical History:  Diagnosis Date  . Anemia   . Cholecystitis 07/16/10  . Genital warts 2004  . Hemorrhoids   . HIV (human immunodeficiency virus infection) (HCC)   . Hx of pelvic inflammatory disease 08/31/2013   And hx of multiple STDs   . Hypertension   . Pregnancy induced hypertension   . Sickle cell trait [facility](HCC)     Past Surgical History:  Procedure Laterality Date  . CHOLECYSTECTOMY    . WISDOM TOOTH EXTRACTION      Family History  Problem Relation Age of Onset  . Cancer Mother   . Hypertension Mother   . Diabetes Mother   . Pancreatitis Mother   . Hypertension Father   . Diabetes Maternal Aunt     Social History  Substance Use Topics  . Smoking status: Former Smoker    Packs/day: 0.30    Types: Cigarettes    Start date: 03/11/2012  .  Smokeless tobacco: Never Used  . Alcohol use No     Comment: occ    Allergies:  Allergies  Allergen Reactions  . Stadol [Butorphanol Tartrate] Itching    Tolerates percocet    Prescriptions Prior to Admission  Medication Sig Dispense Refill Last Dose  . acetaminophen (TYLENOL) 500 MG tablet Take 500 mg by mouth every 6 (six) hours as needed for mild pain, moderate pain or headache.   Taking  . aspirin 81 MG chewable tablet Chew 1 tablet (81 mg total) by mouth daily. (Patient not taking: Reported on 07/30/2016) 60 tablet 3 Not Taking  . Elastic Bandages & Supports (COMFORT FIT MATERNITY SUPP LG) MISC 1 Device by Does not apply route daily. (Patient not taking: Reported on 07/22/2016) 1 each 0 Not Taking  . emtricitabine-tenofovir (TRUVADA) 200-300 MG tablet Take 1 tablet by mouth daily. 30 tablet 11 Taking  . ferrous sulfate 325 (65 FE) MG tablet Take 1 tablet (325 mg total) by mouth 2 (two) times daily with a meal. (Patient not taking: Reported on 07/22/2016) 60 tablet 1 Not Taking  . hydrocortisone (ANUSOL-HC) 2.5 % rectal cream Apply rectally 2 times daily (Patient not taking: Reported on 07/22/2016) 28 g 0 Not Taking  . ondansetron (ZOFRAN ODT) 8 MG disintegrating tablet Take 1 tablet (8 mg total)  by mouth every 8 (eight) hours as needed for nausea or vomiting. 40 tablet 1 Taking  . Prenatal Vit-Fe Fumarate-FA (PRENATAL VITAMIN) 27-0.8 MG TABS Take 1 tablet by mouth daily. 30 tablet 6 Taking  . Raltegravir Potassium (ISENTRESS HD) 600 MG TABS Take 2 tablets by mouth daily. 60 tablet 11 Taking  . ranitidine (ZANTAC) 150 MG tablet Take 1 tablet (150 mg total) by mouth 2 (two) times daily. (Patient not taking: Reported on 07/22/2016) 60 tablet 1 Not Taking    Review of Systems  Constitutional: Negative.   Eyes: Negative for visual disturbance.  Gastrointestinal: Positive for abdominal pain and nausea. Negative for constipation, diarrhea and vomiting.  Genitourinary: Positive for vaginal  discharge. Negative for dysuria and vaginal bleeding.  Neurological: Positive for headaches.   Physical Exam   Blood pressure 137/77, pulse 62, temperature 98.1 F (36.7 C), temperature source Oral, resp. rate 18, weight 252 lb (114.3 kg), last menstrual period 02/17/2016, SpO2 99 %, not currently breastfeeding.  Temp:  [98.1 F (36.7 C)] 98.1 F (36.7 C) (07/10 0946) Pulse Rate:  [62-76] 62 (07/10 1159) Resp:  [18] 18 (07/10 1159) BP: (133-146)/(77-83) 137/77 (07/10 1159) SpO2:  [99 %] 99 % (07/10 1159) Weight:  [252 lb (114.3 kg)] 252 lb (114.3 kg) (07/10 0946)  Physical Exam  Nursing note and vitals reviewed. Constitutional: She is oriented to person, place, and time. She appears well-developed and well-nourished. No distress.  HENT:  Head: Normocephalic and atraumatic.  Eyes: Conjunctivae are normal. Right eye exhibits no discharge. Left eye exhibits no discharge. No scleral icterus.  Neck: Normal range of motion.  Cardiovascular: Normal rate, regular rhythm and normal heart sounds.   No murmur heard. Respiratory: Effort normal and breath sounds normal. No respiratory distress. She has no wheezes.  GI: Soft. Bowel sounds are normal. There is no tenderness.  Genitourinary: No bleeding in the vagina. Vaginal discharge (small amount of foul smelling yellow discharge. No pooling. ) found.  Neurological: She is alert and oriented to person, place, and time.  Skin: Skin is warm and dry. She is not diaphoretic.  Psychiatric: She has a normal mood and affect. Her behavior is normal. Judgment and thought content normal.   Dilation: Closed Effacement (%): Thick Cervical Position: Anterior Exam by:: [name] NP MAU Course  Procedures Results for orders placed or performed during the hospital encounter of 08/18/16 (from the past 24 hour(s))  Urinalysis, Routine w reflex microscopic     Status: None   Collection Time: 08/18/16  9:39 AM  Result Value Ref Range   Color, Urine  YELLOW YELLOW   APPearance CLEAR CLEAR   Specific Gravity, Urine 1.008 1.005 - 1.030   pH 5.0 5.0 - 8.0   Glucose, UA NEGATIVE NEGATIVE mg/dL   Hgb urine dipstick NEGATIVE NEGATIVE   Bilirubin Urine NEGATIVE NEGATIVE   Ketones, ur NEGATIVE NEGATIVE mg/dL   Protein, ur NEGATIVE NEGATIVE mg/dL   Nitrite NEGATIVE NEGATIVE   Leukocytes, UA NEGATIVE NEGATIVE  Protein / creatinine ratio, urine     Status: None   Collection Time: 08/18/16  9:39 AM  Result Value Ref Range   Creatinine, Urine 62.00 mg/dL   Total Protein, Urine <6.0 mg/dL   Protein Creatinine Ratio        0.00 - 0.15 mg/mg[Cre]  CBC     Status: Abnormal   Collection Time: 08/18/16 10:02 AM  Result Value Ref Range   WBC 5.8 4.0 - 10.5 K/uL   RBC 3.70 (L) 3.87 -  5.11 MIL/uL   Hemoglobin 9.3 (L) 12.0 - 15.0 g/dL   HCT 78.2 (L) 95.6 - 21.3 %   MCV 74.9 (L) 78.0 - 100.0 fL   MCH 25.1 (L) 26.0 - 34.0 pg   MCHC 33.6 30.0 - 36.0 g/dL   RDW 08.6 57.8 - 46.9 %   Platelets 209 150 - 400 K/uL  Comprehensive metabolic panel     Status: Abnormal   Collection Time: 08/18/16 10:02 AM  Result Value Ref Range   Sodium 135 135 - 145 mmol/L   Potassium 3.5 3.5 - 5.1 mmol/L   Chloride 106 101 - 111 mmol/L   CO2 23 22 - 32 mmol/L   Glucose, Bld 88 65 - 99 mg/dL   BUN 6 6 - 20 mg/dL   Creatinine, Ser 6.29 0.44 - 1.00 mg/dL   Calcium 8.2 (L) 8.9 - 10.3 mg/dL   Total Protein 6.1 (L) 6.5 - 8.1 g/dL   Albumin 2.7 (L) 3.5 - 5.0 g/dL   AST 10 (L) 15 - 41 U/L   ALT 9 (L) 14 - 54 U/L   Alkaline Phosphatase 55 38 - 126 U/L   Total Bilirubin 0.4 0.3 - 1.2 mg/dL   GFR calc non Af Amer >60 >60 mL/min   GFR calc Af Amer >60 >60 mL/min   Anion gap 6 5 - 15  Lipase, blood     Status: None   Collection Time: 08/18/16 10:02 AM  Result Value Ref Range   Lipase 24 11 - 51 U/L  Wet prep, genital     Status: Abnormal   Collection Time: 08/18/16 10:17 AM  Result Value Ref Range   Yeast Wet Prep HPF POC NONE SEEN NONE SEEN   Trich, Wet Prep NONE  SEEN NONE SEEN   Clue Cells Wet Prep HPF POC PRESENT (A) NONE SEEN   WBC, Wet Prep HPF POC FEW (A) NONE SEEN   Sperm NONE SEEN   POCT fern test     Status: None   Collection Time: 08/18/16 10:24 AM  Result Value Ref Range   POCT Fern Test Negative = intact amniotic membranes     MDM FHT 145 CBC, CMP, lipase, urine PCR Given pt's hx of CHTN will check PIH labs Pt does not have gall bladder GI cocktail given -- pt reports mild improvement & appears more comfortable No pooling & fern negative Pt reassured by exam & requesting to be discharged home Assessment and Plan  A: 1. Abdominal pain during pregnancy in second trimester   2. BV (bacterial vaginosis)   3. Maternal chronic hypertension in second trimester    P; Discharge home Rx flagyl Discussed reasons to return to MAU Patient has f/u with OB tomorrow & ID on Thursday GC/CT pending  [name] 08/18/2016, 9:53 AM

## 2016-08-18 NOTE — Discharge Instructions (Signed)

## 2016-08-19 ENCOUNTER — Ambulatory Visit: Payer: Self-pay

## 2016-08-19 ENCOUNTER — Ambulatory Visit (INDEPENDENT_AMBULATORY_CARE_PROVIDER_SITE_OTHER): Payer: Medicaid Other | Admitting: Obstetrics and Gynecology

## 2016-08-19 VITALS — BP 137/85 | HR 80 | Wt 252.9 lb

## 2016-08-19 DIAGNOSIS — Z8619 Personal history of other infectious and parasitic diseases: Secondary | ICD-10-CM

## 2016-08-19 DIAGNOSIS — O98719 Human immunodeficiency virus [HIV] disease complicating pregnancy, unspecified trimester: Secondary | ICD-10-CM

## 2016-08-19 DIAGNOSIS — O99340 Other mental disorders complicating pregnancy, unspecified trimester: Secondary | ICD-10-CM

## 2016-08-19 DIAGNOSIS — Z6841 Body Mass Index (BMI) 40.0 and over, adult: Secondary | ICD-10-CM

## 2016-08-19 DIAGNOSIS — O0990 Supervision of high risk pregnancy, unspecified, unspecified trimester: Secondary | ICD-10-CM

## 2016-08-19 DIAGNOSIS — F329 Major depressive disorder, single episode, unspecified: Secondary | ICD-10-CM

## 2016-08-19 DIAGNOSIS — I10 Essential (primary) hypertension: Secondary | ICD-10-CM

## 2016-08-19 DIAGNOSIS — D573 Sickle-cell trait: Secondary | ICD-10-CM

## 2016-08-19 DIAGNOSIS — O0992 Supervision of high risk pregnancy, unspecified, second trimester: Secondary | ICD-10-CM

## 2016-08-19 MED ORDER — DOCUSATE SODIUM 100 MG PO CAPS: 100.0000 mg | ORAL_CAPSULE | Freq: Two times a day (BID) | ORAL | 3 refills | Status: DC | PRN

## 2016-08-19 MED FILL — ONDANSETRON ODT 8 MG TABLET: 8 | 13 days supply | Qty: 40 | Fill #1
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
## 2016-08-19 NOTE — Progress Notes (Signed)
Elevated score on PHQ9 but refused to see behavior137/85 clinician

## 2016-08-20 ENCOUNTER — Ambulatory Visit (INDEPENDENT_AMBULATORY_CARE_PROVIDER_SITE_OTHER): Payer: Medicaid Other | Admitting: Pharmacist Clinician (PhC)/ Clinical Pharmacy Specialist

## 2016-08-20 DIAGNOSIS — B20 Human immunodeficiency virus [HIV] disease: Secondary | ICD-10-CM

## 2016-08-20 LAB — GC/CHLAMYDIA PROBE AMP (~~LOC~~) NOT AT ARMC
Chlamydia: NEGATIVE
NEISSERIA GONORRHEA: NEGATIVE

## 2016-08-20 NOTE — Progress Notes (Signed)
HPI: Catherine MccallumMarkita L Simmons is a 31 y.o. female who is here for her visit with pharmacy for HIV management.   Allergies: Allergies  Allergen Reactions  . Stadol [Butorphanol Tartrate] Itching    Tolerates percocet    Vitals:    Past Medical History: Past Medical History:  Diagnosis Date  . Anemia   . Cholecystitis 07/16/10  . Genital warts 2004  . Hemorrhoids   . HIV (human immunodeficiency virus infection) (HCC)   . Hx of pelvic inflammatory disease 08/31/2013   And hx of multiple STDs   . Hypertension   . Pregnancy induced hypertension   . Sickle cell trait (HCC)     Social History: Social History   Social History  . Marital status: Single    Spouse name: N/A  . Number of children: N/A  . Years of education: N/A   Social History Main Topics  . Smoking status: Former Smoker    Packs/day: 0.30    Types: Cigarettes    Start date: 03/11/2012  . Smokeless tobacco: Never Used  . Alcohol use No     Comment: occ  . Drug use: No  . Sexual activity: Yes    Partners: Male    Birth control/ protection: None   Other Topics Concern  . Not on file   Social History Narrative  . No narrative on file    Previous Regimen: Prezcobix/TRV, ATV/r, Triumeq, Odefsey  Current Regimen: RAL/TRV  Labs: HIV 1 RNA Quant (copies/mL)  Date Value  06/11/2015 <20  04/24/2015 <20  01/23/2015 691 (H)   HIV-1 RNA Viral Load (no units)  Date Value  05/15/2014 <40  04/09/2014 <40  02/19/2014 <20   CD4 (no units)  Date Value  04/09/2014 1,016  02/19/2014 776  12/27/2013 651   CD4 T Cell Abs (/uL)  Date Value  07/10/2016 720  06/11/2015 870  04/24/2015 710   Hep B S Ab (no units)  Date Value  10/20/2012 POS (A)   Hepatitis B Surface Ag (no units)  Date Value  12/20/2014 NEGATIVE   HCV Ab (no units)  Date Value  09/28/2013 NEGATIVE    CrCl: Estimated Creatinine Clearance: 122.1 mL/min (by C-G formula based on SCr of 0.47 mg/dL).  Lipids:    Component Value  Date/Time   CHOL 237 12/27/2013   TRIG 61 10/20/2012 1057   HDL 50 10/20/2012 1057   CHOLHDL 2.9 10/20/2012 1057   VLDL 12 10/20/2012 1057   LDLCALC 139 12/27/2013    Assessment: Catherine Simmons is at 22wks now with her 5th child. She was recently converted from Prezcobix>>RAL due to the recent warning about decreased level during pregnancy. She is taking both meds at night night time without side effects. She hasn't missed any doses. Currently, she has medicaid for the pregnancy. However, it's likely that this will be converted over to family planning after delivery. Told her to meet with Catherine Simmons after her delivery to get the ADAP process started so there won't be gap in her therapy. Otherwise, she is doing well on the current regimen. We will get labs today and set her up an appt with Dr. Drue Simmons.   Recommendations:  Cont ISN/TRV HIV VL and CD4 today F/u with Dr. Drue Simmons in Sept  Minh Pham, PharmD, BCPS, AAHIVP, CPP Clinical Infectious Disease Pharmacist [facility] 08/20/2016, 10:43 AM

## 2016-08-20 NOTE — Patient Instructions (Addendum)
Continue your Isentress and Truvada Follow up with Dr. Drue SecondSnider in September

## 2016-08-21 LAB — T-HELPER CELL (CD4) - (RCID CLINIC ONLY)
CD4 T CELL HELPER: 38 % (ref 33–55)
CD4 T Cell Abs: 670 /uL (ref 400–2700)

## 2016-08-28 LAB — HIV RNA, RTPCR W/R GT (RTI, PI,INT)
HIV-1 RNA, QN PCR: 2.55 Log copies/mL — ABNORMAL HIGH
HIV-1 RNA, QN PCR: 355 copies/mL — ABNORMAL HIGH

## 2016-09-10 ENCOUNTER — Ambulatory Visit (INDEPENDENT_AMBULATORY_CARE_PROVIDER_SITE_OTHER): Payer: Medicaid Other | Admitting: Obstetrics and Gynecology

## 2016-09-10 ENCOUNTER — Ambulatory Visit (HOSPITAL_COMMUNITY)
Admission: RE | Admit: 2016-09-10 | Discharge: 2016-09-10 | Disposition: A | Discharge status: Home / Self Care | Payer: Medicaid Other | Source: Ambulatory Visit | Attending: Nurse Practitioner | Admitting: Nurse Practitioner

## 2016-09-10 ENCOUNTER — Other Ambulatory Visit (HOSPITAL_COMMUNITY): Payer: Self-pay | Admitting: *Deleted

## 2016-09-10 ENCOUNTER — Encounter: Payer: Self-pay | Admitting: Obstetrics and Gynecology

## 2016-09-10 ENCOUNTER — Encounter (HOSPITAL_COMMUNITY): Payer: Self-pay

## 2016-09-10 VITALS — BP 133/79 | HR 88 | Wt 248.7 lb

## 2016-09-10 DIAGNOSIS — F32A Depression, unspecified: Secondary | ICD-10-CM

## 2016-09-10 DIAGNOSIS — Z21 Asymptomatic human immunodeficiency virus [HIV] infection status: Secondary | ICD-10-CM | POA: Insufficient documentation

## 2016-09-10 DIAGNOSIS — O10012 Pre-existing essential hypertension complicating pregnancy, second trimester: Secondary | ICD-10-CM | POA: Diagnosis not present

## 2016-09-10 DIAGNOSIS — O0990 Supervision of high risk pregnancy, unspecified, unspecified trimester: Secondary | ICD-10-CM

## 2016-09-10 DIAGNOSIS — O99340 Other mental disorders complicating pregnancy, unspecified trimester: Secondary | ICD-10-CM

## 2016-09-10 DIAGNOSIS — O98712 Human immunodeficiency virus [HIV] disease complicating pregnancy, second trimester: Secondary | ICD-10-CM | POA: Diagnosis not present

## 2016-09-10 DIAGNOSIS — F329 Major depressive disorder, single episode, unspecified: Secondary | ICD-10-CM

## 2016-09-10 DIAGNOSIS — I10 Essential (primary) hypertension: Secondary | ICD-10-CM

## 2016-09-10 DIAGNOSIS — O0992 Supervision of high risk pregnancy, unspecified, second trimester: Secondary | ICD-10-CM

## 2016-09-10 DIAGNOSIS — O99212 Obesity complicating pregnancy, second trimester: Secondary | ICD-10-CM | POA: Insufficient documentation

## 2016-09-10 DIAGNOSIS — Z3A24 24 weeks gestation of pregnancy: Secondary | ICD-10-CM | POA: Insufficient documentation

## 2016-09-10 DIAGNOSIS — O98719 Human immunodeficiency virus [HIV] disease complicating pregnancy, unspecified trimester: Secondary | ICD-10-CM

## 2016-09-10 DIAGNOSIS — Z8619 Personal history of other infectious and parasitic diseases: Secondary | ICD-10-CM

## 2016-09-10 DIAGNOSIS — B20 Human immunodeficiency virus [HIV] disease: Secondary | ICD-10-CM

## 2016-09-10 DIAGNOSIS — O99342 Other mental disorders complicating pregnancy, second trimester: Secondary | ICD-10-CM

## 2016-09-10 DIAGNOSIS — Z8759 Personal history of other complications of pregnancy, childbirth and the puerperium: Secondary | ICD-10-CM

## 2016-09-10 IMAGING — US US MFM OB FOLLOW-UP
1 series · 13 of 28 positions shown · non-contrast
Comparison: none

[Series 1: us mfm ob follow-up · 13 of 28 slices shown]
[im 2/28]
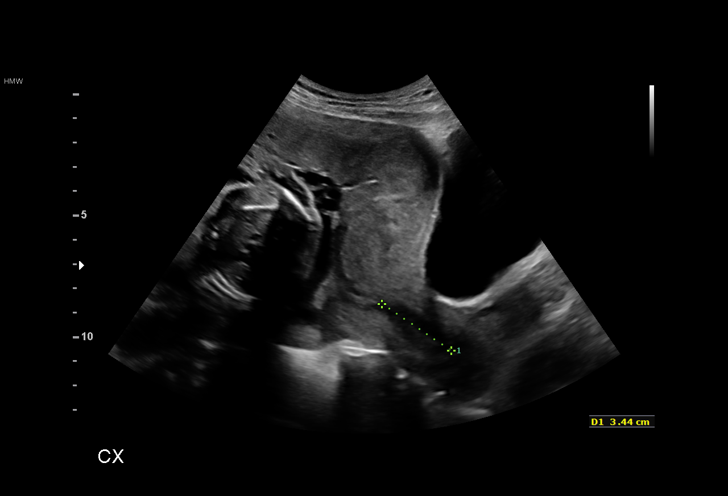
[im 4/28]
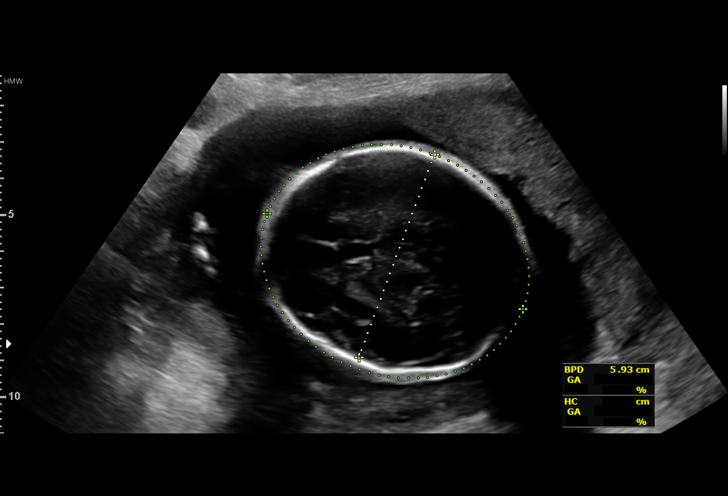
[im 6/28]
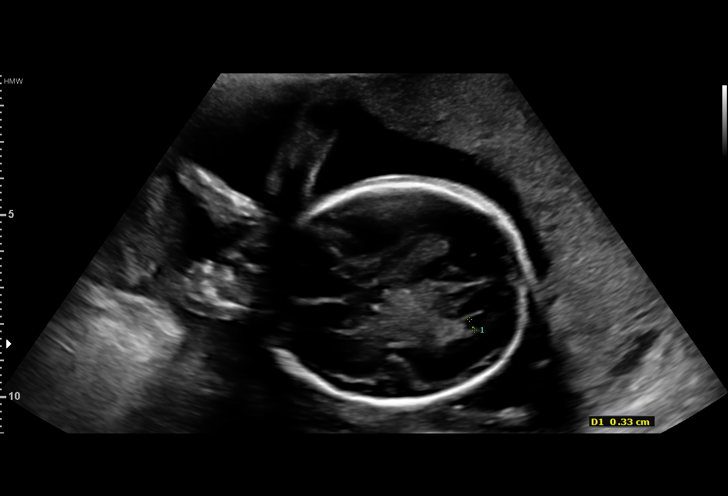
[im 8/28]
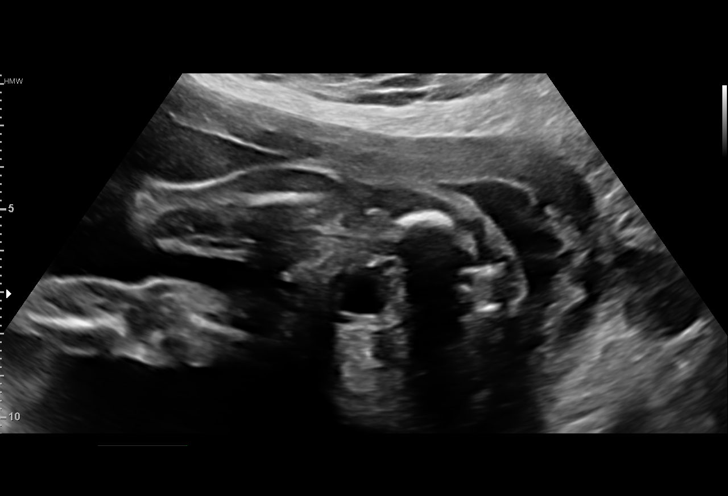
[im 10/28]
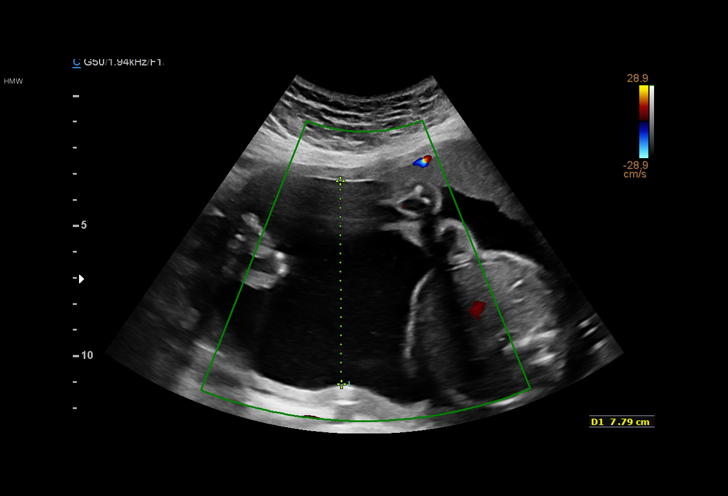
[im 12/28]
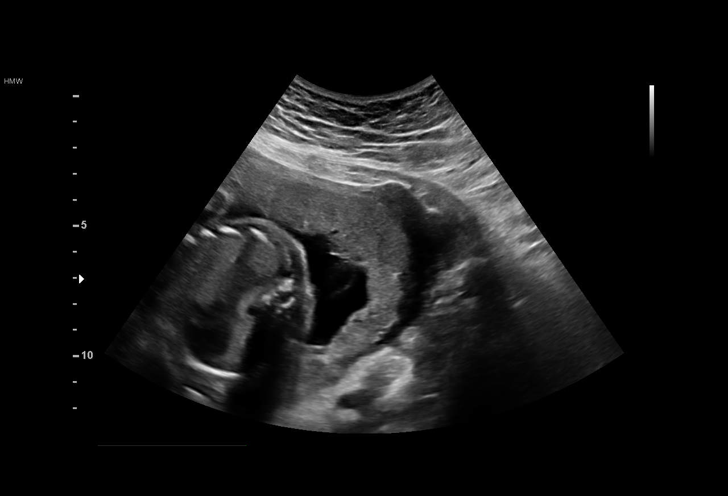
[im 15/28]
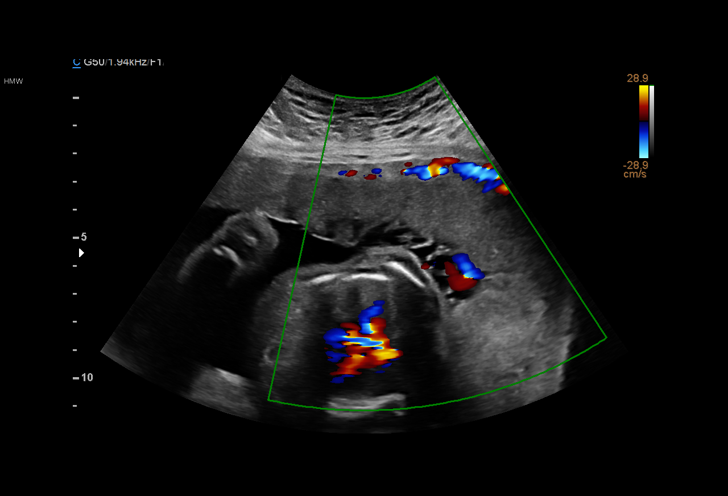
[im 17/28]
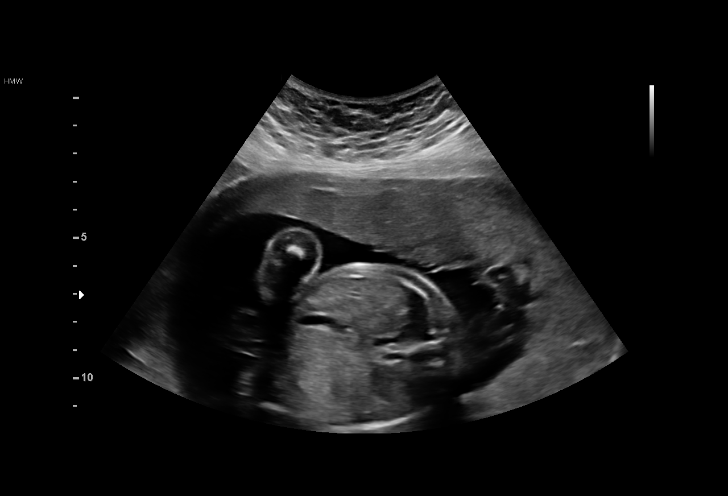
[im 19/28]
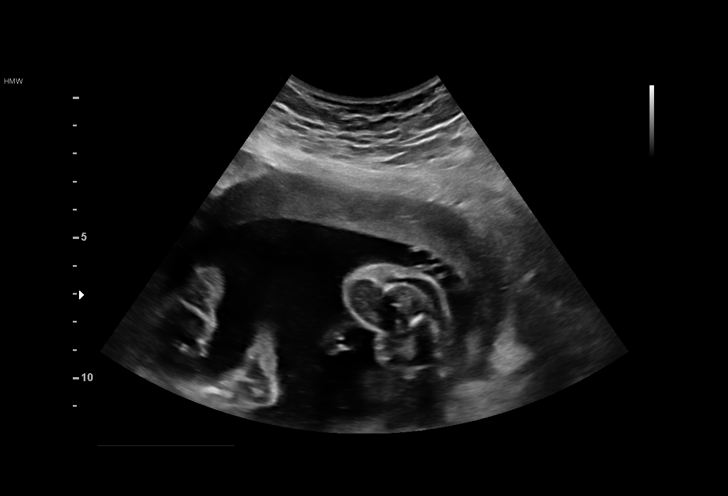
[im 21/28]
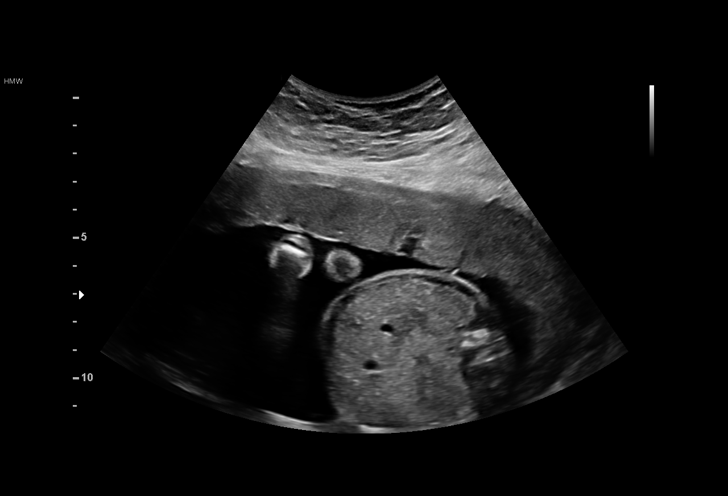
[im 23/28]
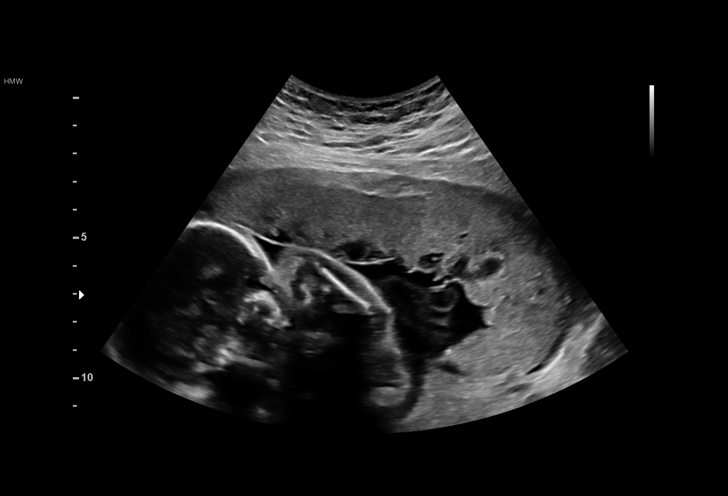
[im 25/28]
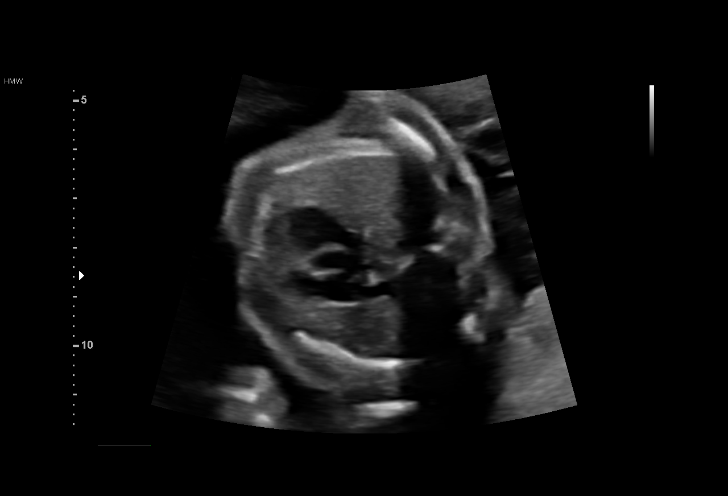
[im 27/28]
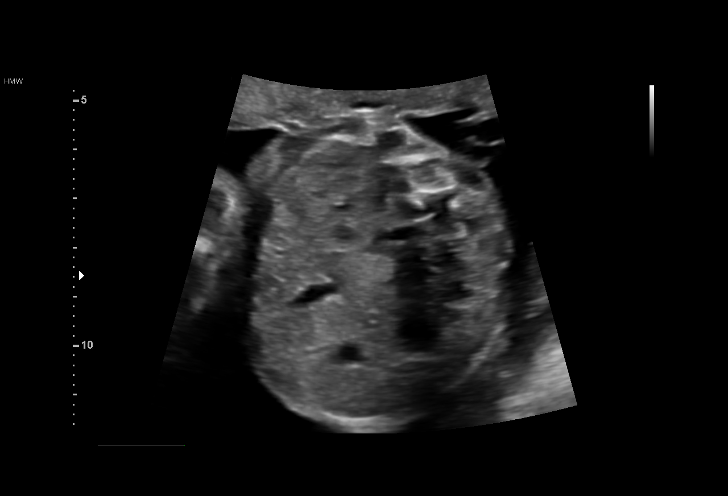

[13 of 28 positions shown; findings below may reference images not displayed]

Performed By:     [facility]          Secondary Phy.:   [REDACTED]
OB/Gyn Clinic

1  [facility]            [PHONE_NUMBER]      [PHONE_NUMBER]     [PHONE_NUMBER]
Indications

24 weeks gestation of pregnancy
HIV affecting pregnancy, second trimester      [FS]
Hypertension - Chronic/Pre-existing            [FS]
Obesity complicating pregnancy, second         [FS]
trimester
Poor obstetric history: Previous               [FS]
preeclampsia / eclampsia/gestational HTN
Other mental disorder complicating             [FS]
pregnancy, unspecified trimester
OB History

Blood Type:            Height:  5'2"   Weight (lb):  238      BMI:
Gravidity:    5         Term:   4
Living:       4
Fetal Evaluation

Num Of Fetuses:     1
Cardiac Activity:   Observed
Presentation:       Transverse, head to maternal right
Placenta:           Anterior, above cervical os
P. Cord Insertion:  Visualized, central
Amniotic Fluid
AFI FV:      Subjectively within normal limits

Largest Pocket(cm)
7.7
Biometry

BPD:      59.6  mm     G. Age:  24w 2d         44  %    CI:         77.7   %   70 - 86
FL/HC:      20.7   %   18.7 -
HC:       214   mm     G. Age:  23w 4d          9  %    HC/AC:      1.13       1.05 -
AC:      190.1  mm     G. Age:  23w 5d         25  %    FL/BPD:     74.3   %   71 - 87
FL:       44.3  mm     G. Age:  24w 4d         47  %    FL/AC:      23.3   %   20 - 24
HUM:      39.3  mm     G. Age:  24w 0d         36  %

Est. FW:     657  gm      1 lb 7 oz     47  %
Gestational Age

LMP:           29w 3d       Date:   [DATE]                 EDD:   [DATE]
U/S Today:     24w 0d                                        EDD:   [DATE]
Best:          24w 2d    Det. By:   U/S  ([DATE])          EDD:   [DATE]
Anatomy

Cranium:               Appears normal         Aortic Arch:            Previously seen
Cavum:                 Previously seen        Ductal Arch:            Previously seen
Ventricles:            Appears normal         Diaphragm:              Previously seen
Choroid Plexus:        Previously seen        Stomach:                Appears normal, left
sided
Cerebellum:            Previously seen        Abdomen:                Previously seen
Posterior Fossa:       Previously seen        Abdominal Wall:         Previously seen
Nuchal Fold:           Previously seen        Cord Vessels:           Previously seen
Face:                  Orbits and profile     Kidneys:                Appear normal
previously seen
Lips:                  Previously seen        Bladder:                Appears normal
Thoracic:              Appears normal         Spine:                  Previously seen
Heart:                 Previously seen        Upper Extremities:      Previously seen
RVOT:                  Appears normal         Lower Extremities:      Previously seen
LVOT:                  Appears normal

Other:  Fetus appears to be a female. Heels and 5th digit visualized. Nasal
bone visualized. Technically difficult due to maternal habitus.
Cervix Uterus Adnexa

Cervix
Length:            3.4  cm.
Normal appearance by transabdominal scan.

Uterus
No abnormality visualized.

Left Ovary
No adnexal mass visualized.

Right Ovary
No adnexal mass visualized.

Cul De Sac:   No free fluid seen.

Adnexa:       No abnormality visualized.
Impression

Single IUP at 24w 2d
CHTN, HIV positive
Fetal growth is appropriate (47th %tile)
Anterior  placenta without previa
Normal amniotic fluid volume
Recommendations

Recommend follow-up ultrasound examination in 4 weeks for
growth
Antenatal testing beginning at 32 weeks (Chronic
hypertension)

## 2016-09-10 MED ORDER — ASPIRIN 81 MG PO CHEW: 81.0000 mg | CHEWABLE_TABLET | Freq: Every day | ORAL | 3 refills | Status: DC
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
## 2016-09-10 NOTE — Progress Notes (Signed)
Per chart review have not received hbsag result. I called [facility] and they also do not have it- informed me they will check with state lab and then fax it to me later today.  PHQ-9 and Gad 7 elevated. Offered for her to see Asher MuirJamie , our Behavioral health clinician which she denied but is aware she can call to see her anytime .

## 2016-09-11 ENCOUNTER — Encounter: Payer: Self-pay | Admitting: *Deleted

## 2016-09-14 MED FILL — TRUVADA 200-300 MG TABS: 200-300 | 30 days supply | Qty: 30 | Fill #2

## 2016-09-14 MED FILL — ISENTRESS HD 600 MG TAB: 600 | 30 days supply | Qty: 60 | Fill #1

## 2016-09-16 ENCOUNTER — Telehealth: Payer: Self-pay | Admitting: General Practice

## 2016-09-16 ENCOUNTER — Other Ambulatory Visit: Payer: Self-pay | Admitting: Pharmacist

## 2016-09-16 DIAGNOSIS — O0990 Supervision of high risk pregnancy, unspecified, unspecified trimester: Secondary | ICD-10-CM

## 2016-09-16 MED ORDER — ONDANSETRON 8 MG PO TBDP: 8.0000 mg | ORAL_TABLET | Freq: Three times a day (TID) | ORAL | 4 refills | Status: DC | PRN

## 2016-09-16 MED FILL — ONDANSETRON ODT 8 MG TABLET: 8 | 13 days supply | Qty: 40 | Fill #0

## 2016-09-16 NOTE — Telephone Encounter (Signed)
-----   Message from Marti SleighSctaria J Malloy, [place] sent at 09/16/2016  9:50 AM EDT ----- Regarding: Medication This patient need a refill on some medication.  Not sure what kind it is.  If someone can please give this patient a call back.

## 2016-09-16 NOTE — Telephone Encounter (Signed)
Called patient regarding refill and she states she already got the prescription. Patient had no questions

## 2016-10-06 ENCOUNTER — Encounter (HOSPITAL_COMMUNITY): Payer: Self-pay

## 2016-10-06 MED FILL — ONDANSETRON ODT 8 MG TABLET: 8 | 13 days supply | Qty: 40 | Fill #1

## 2016-10-06 MED FILL — TRUVADA 200-300 MG TABS: 200-300 | 30 days supply | Qty: 30 | Fill #3

## 2016-10-06 MED FILL — ISENTRESS HD 600 MG TAB: 600 | 30 days supply | Qty: 60 | Fill #2

## 2016-10-08 ENCOUNTER — Other Ambulatory Visit (HOSPITAL_COMMUNITY)
Admission: RE | Admit: 2016-10-08 | Discharge: 2016-10-08 | Disposition: A | Discharge status: Home / Self Care | Payer: Medicaid Other | Source: Ambulatory Visit | Attending: Obstetrics & Gynecology | Admitting: Obstetrics & Gynecology

## 2016-10-08 ENCOUNTER — Encounter: Payer: Self-pay | Admitting: Obstetrics & Gynecology

## 2016-10-08 ENCOUNTER — Ambulatory Visit (HOSPITAL_COMMUNITY)
Admission: RE | Admit: 2016-10-08 | Discharge: 2016-10-08 | Disposition: A | Discharge status: Home / Self Care | Payer: Medicaid Other | Source: Ambulatory Visit | Attending: Obstetrics & Gynecology | Admitting: Obstetrics & Gynecology

## 2016-10-08 ENCOUNTER — Ambulatory Visit (INDEPENDENT_AMBULATORY_CARE_PROVIDER_SITE_OTHER): Payer: Medicaid Other | Admitting: Obstetrics & Gynecology

## 2016-10-08 ENCOUNTER — Inpatient Hospital Stay (HOSPITAL_COMMUNITY)
Admission: AD | Admit: 2016-10-08 | Discharge: 2016-10-08 | Disposition: A | Discharge status: Home / Self Care | Payer: Medicaid Other | Source: Ambulatory Visit | Attending: Obstetrics & Gynecology | Admitting: Obstetrics & Gynecology

## 2016-10-08 ENCOUNTER — Encounter (HOSPITAL_COMMUNITY): Payer: Self-pay

## 2016-10-08 ENCOUNTER — Other Ambulatory Visit (HOSPITAL_COMMUNITY): Payer: Self-pay | Admitting: *Deleted

## 2016-10-08 VITALS — BP 143/77 | HR 89 | Wt 259.8 lb

## 2016-10-08 DIAGNOSIS — Z21 Asymptomatic human immunodeficiency virus [HIV] infection status: Secondary | ICD-10-CM

## 2016-10-08 DIAGNOSIS — Z9049 Acquired absence of other specified parts of digestive tract: Secondary | ICD-10-CM | POA: Diagnosis not present

## 2016-10-08 DIAGNOSIS — O9989 Other specified diseases and conditions complicating pregnancy, childbirth and the puerperium: Secondary | ICD-10-CM

## 2016-10-08 DIAGNOSIS — G43009 Migraine without aura, not intractable, without status migrainosus: Secondary | ICD-10-CM | POA: Diagnosis not present

## 2016-10-08 DIAGNOSIS — Z8249 Family history of ischemic heart disease and other diseases of the circulatory system: Secondary | ICD-10-CM | POA: Diagnosis not present

## 2016-10-08 DIAGNOSIS — Z8759 Personal history of other complications of pregnancy, childbirth and the puerperium: Secondary | ICD-10-CM

## 2016-10-08 DIAGNOSIS — N898 Other specified noninflammatory disorders of vagina: Secondary | ICD-10-CM | POA: Insufficient documentation

## 2016-10-08 DIAGNOSIS — O163 Unspecified maternal hypertension, third trimester: Secondary | ICD-10-CM | POA: Insufficient documentation

## 2016-10-08 DIAGNOSIS — Z79899 Other long term (current) drug therapy: Secondary | ICD-10-CM | POA: Diagnosis not present

## 2016-10-08 DIAGNOSIS — N989 Complication associated with artificial fertilization, unspecified: Secondary | ICD-10-CM | POA: Diagnosis present

## 2016-10-08 DIAGNOSIS — Z885 Allergy status to narcotic agent status: Secondary | ICD-10-CM | POA: Diagnosis not present

## 2016-10-08 DIAGNOSIS — Z87891 Personal history of nicotine dependence: Secondary | ICD-10-CM | POA: Diagnosis not present

## 2016-10-08 DIAGNOSIS — Z113 Encounter for screening for infections with a predominantly sexual mode of transmission: Secondary | ICD-10-CM | POA: Diagnosis not present

## 2016-10-08 DIAGNOSIS — O99340 Other mental disorders complicating pregnancy, unspecified trimester: Secondary | ICD-10-CM

## 2016-10-08 DIAGNOSIS — O98713 Human immunodeficiency virus [HIV] disease complicating pregnancy, third trimester: Secondary | ICD-10-CM | POA: Insufficient documentation

## 2016-10-08 DIAGNOSIS — Z23 Encounter for immunization: Secondary | ICD-10-CM | POA: Diagnosis not present

## 2016-10-08 DIAGNOSIS — Z7982 Long term (current) use of aspirin: Secondary | ICD-10-CM | POA: Diagnosis not present

## 2016-10-08 DIAGNOSIS — O26893 Other specified pregnancy related conditions, third trimester: Secondary | ICD-10-CM | POA: Diagnosis not present

## 2016-10-08 DIAGNOSIS — O98719 Human immunodeficiency virus [HIV] disease complicating pregnancy, unspecified trimester: Secondary | ICD-10-CM

## 2016-10-08 DIAGNOSIS — E669 Obesity, unspecified: Secondary | ICD-10-CM | POA: Insufficient documentation

## 2016-10-08 DIAGNOSIS — O10013 Pre-existing essential hypertension complicating pregnancy, third trimester: Secondary | ICD-10-CM | POA: Insufficient documentation

## 2016-10-08 DIAGNOSIS — Z3A28 28 weeks gestation of pregnancy: Secondary | ICD-10-CM | POA: Insufficient documentation

## 2016-10-08 DIAGNOSIS — O0990 Supervision of high risk pregnancy, unspecified, unspecified trimester: Secondary | ICD-10-CM

## 2016-10-08 DIAGNOSIS — R51 Headache: Secondary | ICD-10-CM | POA: Insufficient documentation

## 2016-10-08 DIAGNOSIS — Z6841 Body Mass Index (BMI) 40.0 and over, adult: Secondary | ICD-10-CM | POA: Insufficient documentation

## 2016-10-08 DIAGNOSIS — I10 Essential (primary) hypertension: Secondary | ICD-10-CM

## 2016-10-08 DIAGNOSIS — O09293 Supervision of pregnancy with other poor reproductive or obstetric history, third trimester: Secondary | ICD-10-CM | POA: Insufficient documentation

## 2016-10-08 DIAGNOSIS — B20 Human immunodeficiency virus [HIV] disease: Secondary | ICD-10-CM

## 2016-10-08 DIAGNOSIS — O0993 Supervision of high risk pregnancy, unspecified, third trimester: Secondary | ICD-10-CM

## 2016-10-08 DIAGNOSIS — F329 Major depressive disorder, single episode, unspecified: Secondary | ICD-10-CM

## 2016-10-08 DIAGNOSIS — Z833 Family history of diabetes mellitus: Secondary | ICD-10-CM | POA: Insufficient documentation

## 2016-10-08 DIAGNOSIS — F32A Depression, unspecified: Secondary | ICD-10-CM

## 2016-10-08 DIAGNOSIS — O10913 Unspecified pre-existing hypertension complicating pregnancy, third trimester: Secondary | ICD-10-CM

## 2016-10-08 DIAGNOSIS — O99213 Obesity complicating pregnancy, third trimester: Secondary | ICD-10-CM | POA: Insufficient documentation

## 2016-10-08 DIAGNOSIS — Z809 Family history of malignant neoplasm, unspecified: Secondary | ICD-10-CM | POA: Diagnosis not present

## 2016-10-08 DIAGNOSIS — D573 Sickle-cell trait: Secondary | ICD-10-CM | POA: Insufficient documentation

## 2016-10-08 DIAGNOSIS — R1011 Right upper quadrant pain: Secondary | ICD-10-CM | POA: Diagnosis not present

## 2016-10-08 DIAGNOSIS — O98712 Human immunodeficiency virus [HIV] disease complicating pregnancy, second trimester: Secondary | ICD-10-CM

## 2016-10-08 DIAGNOSIS — O99343 Other mental disorders complicating pregnancy, third trimester: Secondary | ICD-10-CM | POA: Insufficient documentation

## 2016-10-08 LAB — CBC WITH DIFFERENTIAL/PLATELET
Basophils Absolute: 0 10*3/uL (ref 0.0–0.1)
Basophils Relative: 0 %
EOS PCT: 1 %
Eosinophils Absolute: 0.1 10*3/uL (ref 0.0–0.7)
HCT: 27.7 % — ABNORMAL LOW (ref 36.0–46.0)
Hemoglobin: 9.2 g/dL — ABNORMAL LOW (ref 12.0–15.0)
LYMPHS ABS: 2.2 10*3/uL (ref 0.7–4.0)
LYMPHS PCT: 33 %
MCH: 24.9 pg — AB (ref 26.0–34.0)
MCHC: 33.2 g/dL (ref 30.0–36.0)
MCV: 74.9 fL — AB (ref 78.0–100.0)
MONO ABS: 0.3 10*3/uL (ref 0.1–1.0)
Monocytes Relative: 4 %
Neutro Abs: 4.1 10*3/uL (ref 1.7–7.7)
Neutrophils Relative %: 62 %
PLATELETS: 210 10*3/uL (ref 150–400)
RBC: 3.7 MIL/uL — ABNORMAL LOW (ref 3.87–5.11)
RDW: 13.8 % (ref 11.5–15.5)
WBC: 6.6 10*3/uL (ref 4.0–10.5)

## 2016-10-08 LAB — COMPREHENSIVE METABOLIC PANEL
ALT: 10 U/L — ABNORMAL LOW (ref 14–54)
AST: 9 U/L — ABNORMAL LOW (ref 15–41)
Albumin: 2.6 g/dL — ABNORMAL LOW (ref 3.5–5.0)
Alkaline Phosphatase: 74 U/L (ref 38–126)
Anion gap: 7 (ref 5–15)
BUN: 5 mg/dL — ABNORMAL LOW (ref 6–20)
CHLORIDE: 103 mmol/L (ref 101–111)
CO2: 22 mmol/L (ref 22–32)
CREATININE: 0.46 mg/dL (ref 0.44–1.00)
Calcium: 8.1 mg/dL — ABNORMAL LOW (ref 8.9–10.3)
Glucose, Bld: 85 mg/dL (ref 65–99)
POTASSIUM: 3.3 mmol/L — AB (ref 3.5–5.1)
Sodium: 132 mmol/L — ABNORMAL LOW (ref 135–145)
Total Bilirubin: 0.4 mg/dL (ref 0.3–1.2)
Total Protein: 6.4 g/dL — ABNORMAL LOW (ref 6.5–8.1)

## 2016-10-08 LAB — PROTEIN / CREATININE RATIO, URINE
CREATININE, URINE: 84 mg/dL
Protein Creatinine Ratio: 0.07 mg/mg{Cre} (ref 0.00–0.15)
TOTAL PROTEIN, URINE: 6 mg/dL

## 2016-10-08 LAB — URINALYSIS, ROUTINE W REFLEX MICROSCOPIC
BILIRUBIN URINE: NEGATIVE
Glucose, UA: NEGATIVE mg/dL
HGB URINE DIPSTICK: NEGATIVE
KETONES UR: NEGATIVE mg/dL
Leukocytes, UA: NEGATIVE
NITRITE: NEGATIVE
PH: 5 (ref 5.0–8.0)
Protein, ur: NEGATIVE mg/dL
Specific Gravity, Urine: 1.011 (ref 1.005–1.030)

## 2016-10-08 IMAGING — US US MFM OB FOLLOW-UP
1 series · 14 of 28 positions shown · non-contrast
Comparison: none

[Series 1: us mfm ob follow-up · 45 acquisitions, 14 frames shown]
[im 2/45]
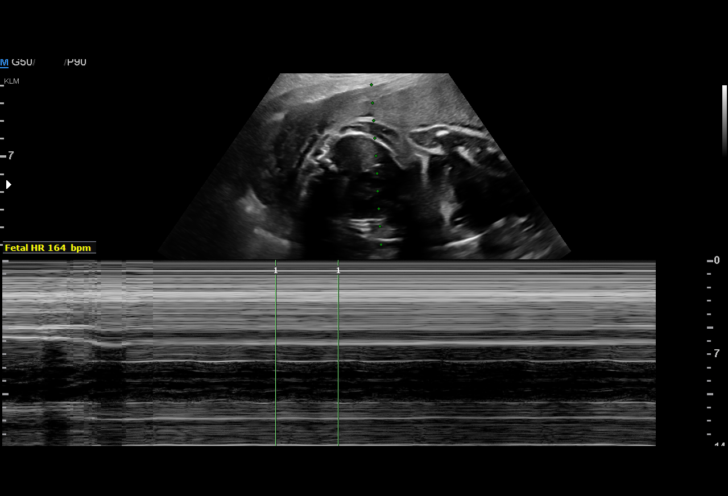
[im 5/45]
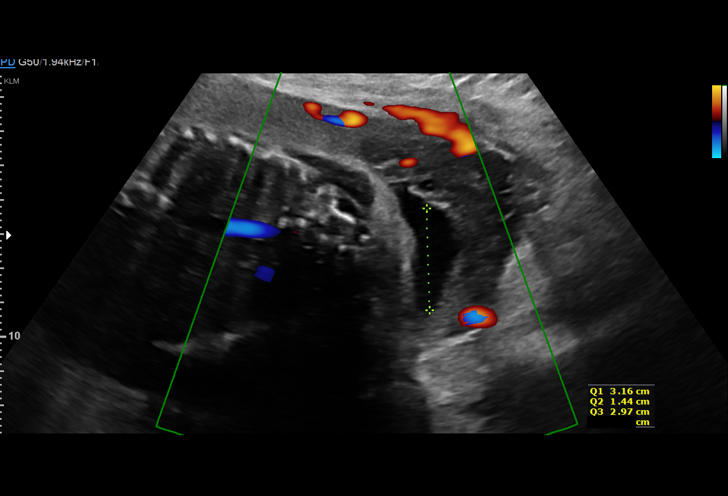
[im 9/45]
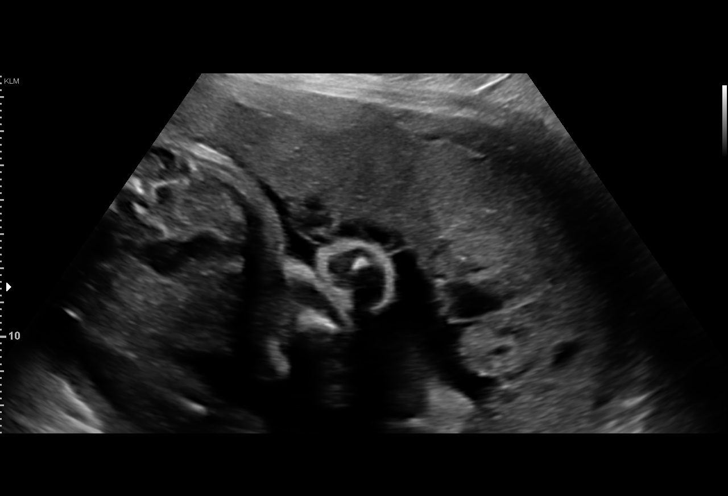
[im 12/45]
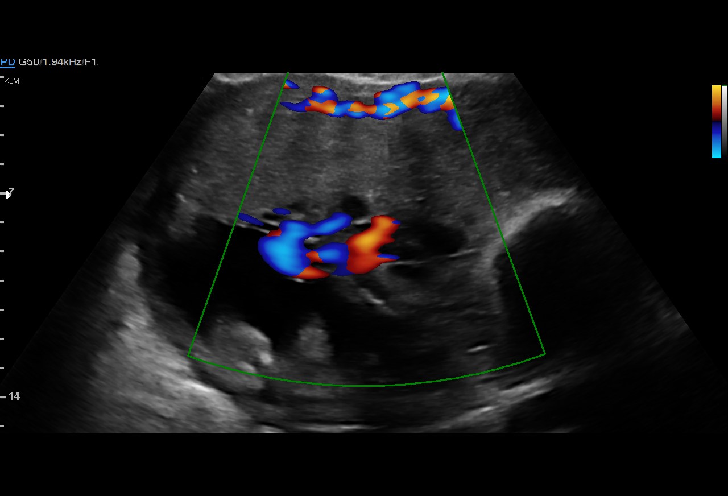
[im 15/45]
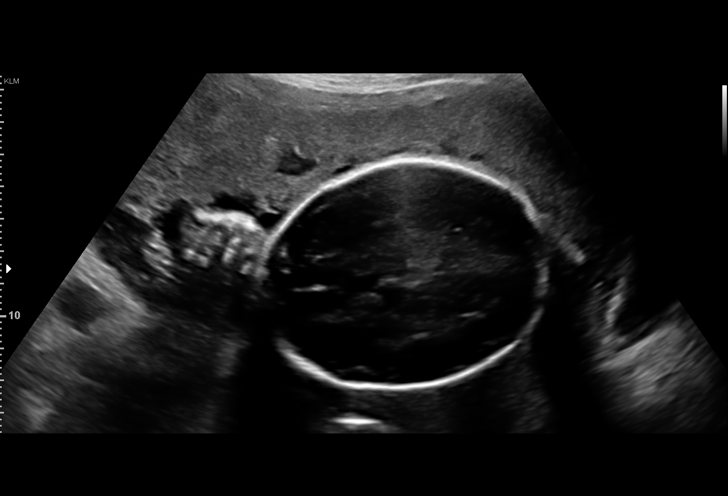
[im 18/45]
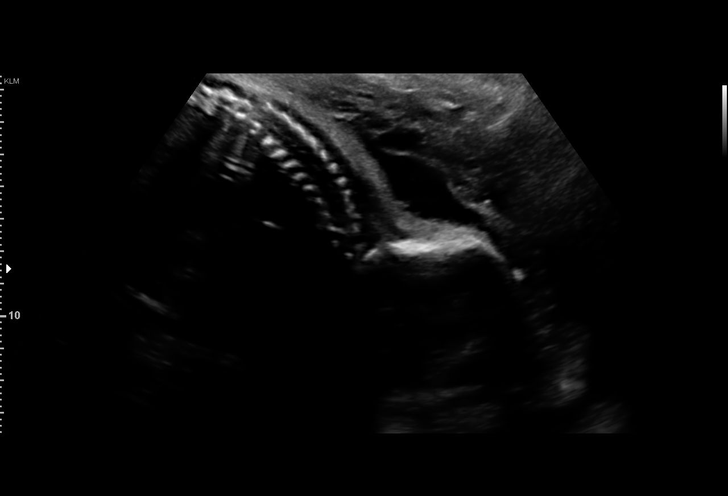
[im 22/45]
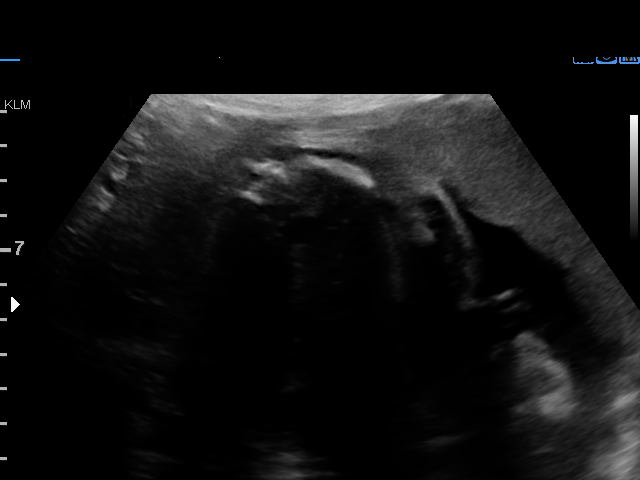
[im 25/45]
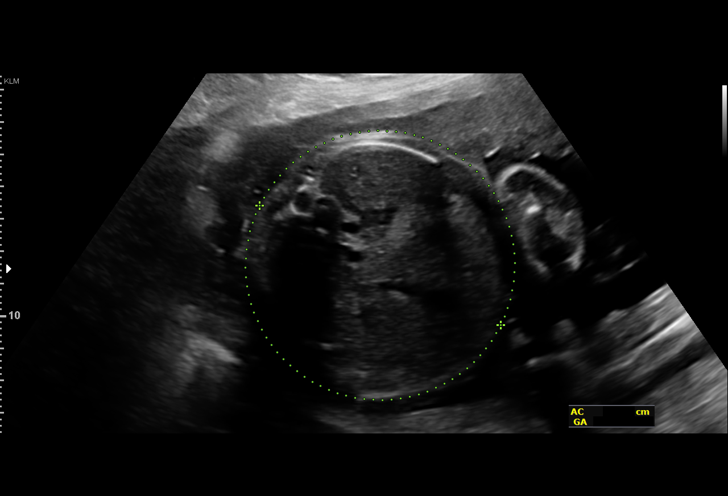
[im 28/45]
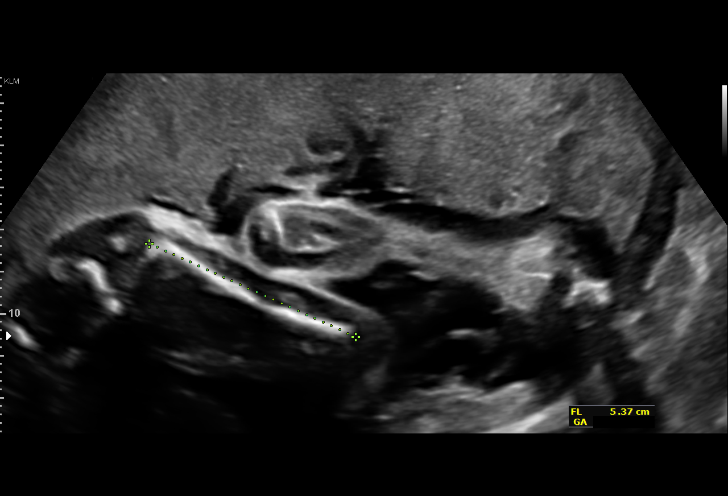
[im 31/45]
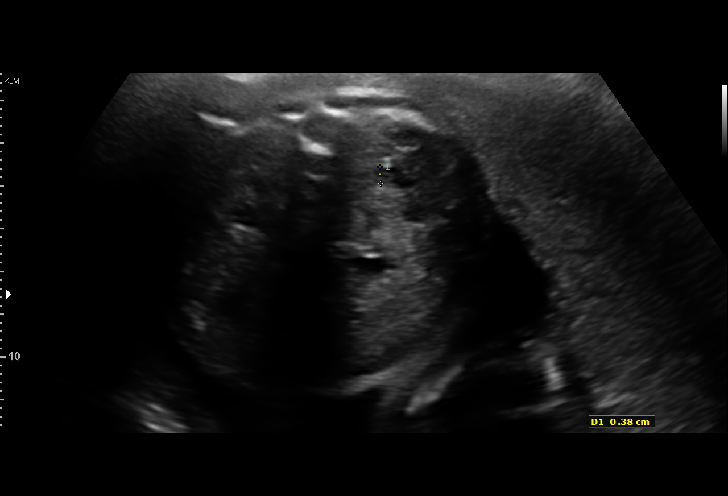
[im 35/45]
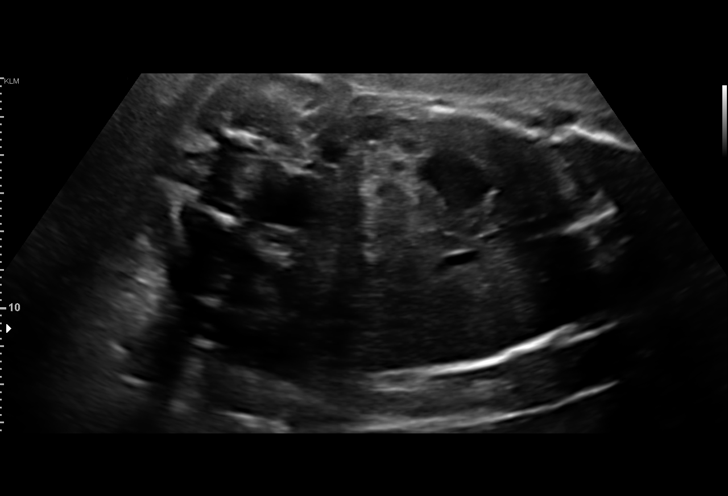
[im 38/45]
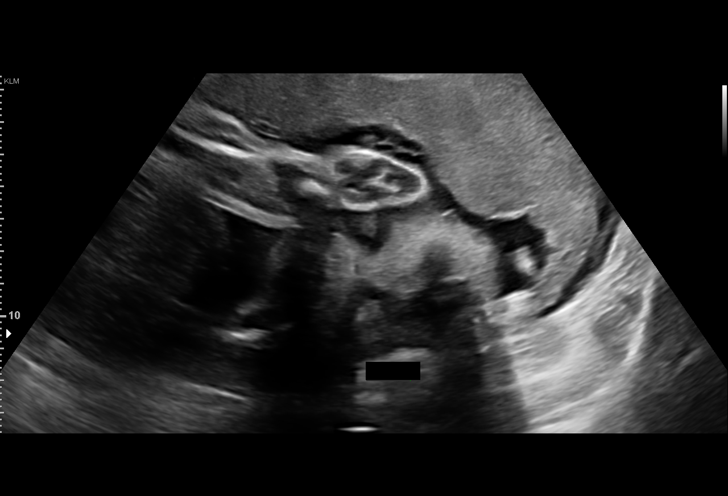
[im 41/45]
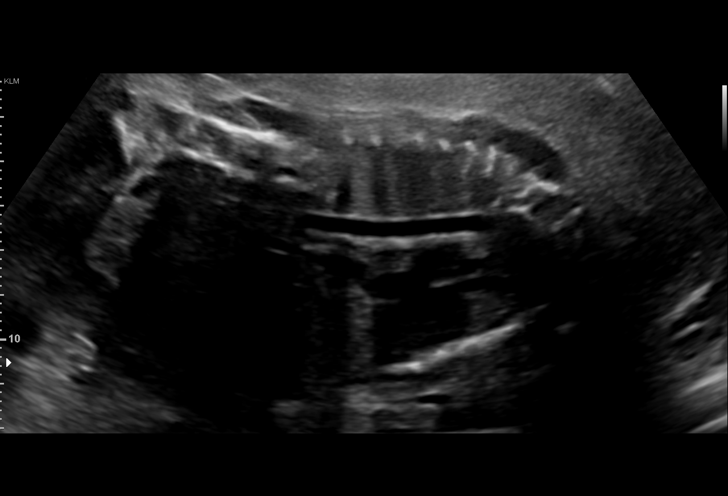
[im 45/45]
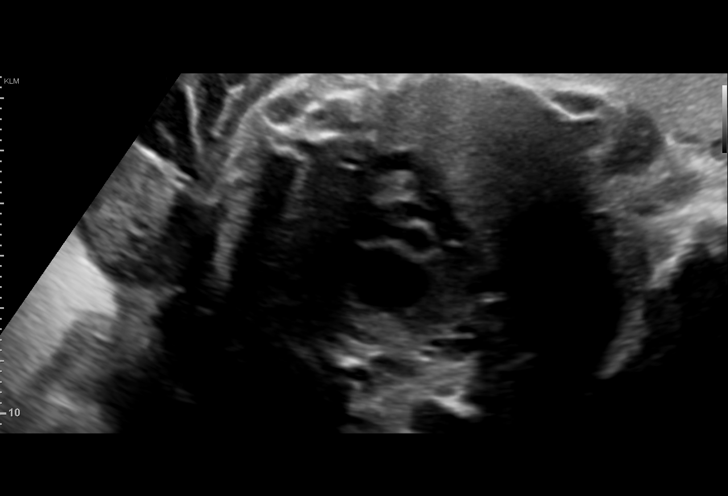

[14 of 28 positions shown; findings below may reference images not displayed]

Performed By:     MENSAH          Secondary Phy.:   [REDACTED]
OB/Gyn Clinic

1  MENSAH            [PHONE_NUMBER]      [PHONE_NUMBER]     [PHONE_NUMBER]
Indications

28 weeks gestation of pregnancy
Hypertension - Chronic/Pre-existing            [IU]
Poor obstetric history: Previous               [IU]
preeclampsia / eclampsia/gestational HTN
Other mental disorder complicating             [IU]
pregnancy, unspecified trimester
HIV affecting pregnancy, third trimester       [IU]
Obesity complicating pregnancy, third          [IU]
trimester
OB History

Blood Type:            Height:  5'2"   Weight (lb):  238      BMI:
Gravidity:    5         Term:   4
Living:       4
Fetal Evaluation

Num Of Fetuses:     1
Fetal Heart         164
Rate(bpm):
Cardiac Activity:   Observed
Presentation:       Cephalic
Placenta:           Anterior, above cervical os
P. Cord Insertion:  Visualized

Amniotic Fluid
AFI FV:      Subjectively within normal limits

AFI Sum(cm)     %Tile       Largest Pocket(cm)
12.98           36

RUQ(cm)       RLQ(cm)       LUQ(cm)        LLQ(cm)
3.16
Biometry

BPD:      69.5  mm     G. Age:  27w 6d         27  %    CI:        71.51   %   70 - 86
FL/HC:      19.9   %   18.8 -
HC:      261.7  mm     G. Age:  28w 3d         24  %    HC/AC:      1.02       1.05 -
AC:      257.2  mm     G. Age:  29w 6d         85  %    FL/BPD:     75.1   %   71 - 87
FL:       52.2  mm     G. Age:  27w 6d         23  %    FL/AC:      20.3   %   20 - 24

Est. FW:    [IU]  gm    2 lb 14 oz      64  %
Gestational Age

LMP:           33w 3d       Date:   [DATE]                 EDD:   [DATE]
U/S Today:     28w 4d                                        EDD:   [DATE]
Best:          28w 2d    Det. By:   U/S  ([DATE])          EDD:   [DATE]
Anatomy

Cranium:               Appears normal         Aortic Arch:            Appears normal
Cavum:                 Previously seen        Ductal Arch:            Appears normal
Ventricles:            Appears normal         Diaphragm:              Previously seen
Choroid Plexus:        Previously seen        Stomach:                Appears normal, left
sided
Cerebellum:            Previously seen        Abdomen:                Previously seen
Posterior Fossa:       Previously seen        Abdominal Wall:         Previously seen
Nuchal Fold:           Previously seen        Cord Vessels:           Previously seen
Face:                  Orbits and profile     Kidneys:                Appear normal
previously seen
Lips:                  Previously seen        Bladder:                Appears normal
Thoracic:              Appears normal         Spine:                  Previously seen
Heart:                 Previously seen        Upper Extremities:      Previously seen
RVOT:                  Previously seen        Lower Extremities:      Previously seen
LVOT:                  Previously seen

Other:  Fetus appears to be a female. Heels and 5th digit previously
visualized. Nasal bone previously visualized. Technically difficult due
to maternal habitus.
Cervix Uterus Adnexa

Cervix
Not visualized (advanced GA >[IU])
Impression

Single living intrauterine pregnancy at 28 weeks 2 days.
Appropriate interval fetal growth (64%).
Normal amniotic fluid volume.
Normal interval fetal anatomy.
Recommendations

Recommend follow-up ultrasound examination in 4 weeks to
reassess fetal growth.
Starting antenatal testing in 4 weeks.

## 2016-10-08 MED ORDER — SUMATRIPTAN SUCCINATE 25 MG PO TABS
25.0000 mg | ORAL_TABLET | Freq: Once | ORAL | Status: AC
  Administered 2016-10-08: 25 mg via ORAL
  Filled 2016-10-08: qty 1

## 2016-10-08 MED ORDER — PROMETHAZINE HCL 25 MG/ML IJ SOLN
12.5000 mg | Freq: Once | INTRAMUSCULAR | Status: AC
  Administered 2016-10-08: 12.5 mg via INTRAMUSCULAR
  Filled 2016-10-08: qty 1

## 2016-10-08 MED ORDER — BUTALBITAL-APAP-CAFFEINE 50-325-40 MG PO TABS: 1.0000 | ORAL_TABLET | Freq: Four times a day (QID) | ORAL | 0 refills | Status: DC | PRN

## 2016-10-08 MED ORDER — BUTALBITAL-APAP-CAFFEINE 50-325-40 MG PO TABS
2.0000 | ORAL_TABLET | Freq: Once | ORAL | Status: AC
  Administered 2016-10-08: 2 via ORAL
  Filled 2016-10-08: qty 2

## 2016-10-08 NOTE — MAU Provider Note (Signed)
History     CSN: [id number]  Arrival date and time: 10/08/16 1206   First Provider Initiated Contact with Patient 10/08/16 1243      Chief Complaint  Patient presents with  . Hypertension   HPI  Catherine Simmons is a 31 y.o. G5P4004 at 2774w2d who presents to MAU today from the office for further evaluation of elevated blood pressure. The patient has CHTN and was on amlodipine and HCTZ before becoming pregnant. She is currently only taking 81 mg ASA. She has headache today. She was given Tylenol in the office and felt it helped a little bit, but headache is returning now. She rates her headache at 6/10. She has noted some blurred vision and intermittent floaters. She has mild RUQ abdominal pain and peripheral edema. She reports normal fetal movement and denies vaginal bleeding, LOF or contractions.   OB History    Gravida Para Term Preterm AB Living   5 4 4  0   4   SAB TAB Ectopic Multiple Live Births         0 4      Past Medical History:  Diagnosis Date  . Anemia   . Cholecystitis 07/16/10  . Genital warts 2004  . Hemorrhoids   . HIV (human immunodeficiency virus infection) (HCC)   . Hx of pelvic inflammatory disease 08/31/2013   And hx of multiple STDs   . Hypertension   . Pregnancy induced hypertension   . Sickle cell trait [facility](HCC)     Past Surgical History:  Procedure Laterality Date  . CHOLECYSTECTOMY    . WISDOM TOOTH EXTRACTION      Family History  Problem Relation Age of Onset  . Cancer Mother   . Hypertension Mother   . Diabetes Mother   . Pancreatitis Mother   . Hypertension Father   . Diabetes Maternal Aunt     Social History  Substance Use Topics  . Smoking status: Former Smoker    Packs/day: 0.30    Types: Cigarettes    Start date: 03/11/2012  . Smokeless tobacco: Never Used  . Alcohol use No     Comment: occ    Allergies:  Allergies  Allergen Reactions  . Stadol [Butorphanol Tartrate] Itching    Tolerates percocet    Prescriptions  Prior to Admission  Medication Sig Dispense Refill Last Dose  . acetaminophen (TYLENOL) 500 MG tablet Take 500 mg by mouth every 6 (six) hours as needed for mild pain, moderate pain or headache.   Taking  . aspirin 81 MG chewable tablet Chew 1 tablet (81 mg total) by mouth daily. 60 tablet 3 Taking  . docusate sodium (COLACE) 100 MG capsule Take 1 capsule (100 mg total) by mouth 2 (two) times daily as needed. 60 capsule 3 Taking  . emtricitabine-tenofovir (TRUVADA) 200-300 MG tablet Take 1 tablet by mouth daily. 30 tablet 11 Taking  . ferrous sulfate 325 (65 FE) MG tablet Take 1 tablet (325 mg total) by mouth 2 (two) times daily with a meal. (Patient not taking: Reported on 10/08/2016) 60 tablet 1 Not Taking  . ondansetron (ZOFRAN ODT) 8 MG disintegrating tablet Take 1 tablet (8 mg total) by mouth every 8 (eight) hours as needed for nausea or vomiting. 40 tablet 4 Taking  . Prenatal Vit-Fe Fumarate-FA (PRENATAL VITAMIN) 27-0.8 MG TABS Take 1 tablet by mouth daily. 30 tablet 6 Taking  . Raltegravir Potassium (ISENTRESS HD) 600 MG TABS Take 2 tablets by mouth daily. 60 tablet 11  Taking    Review of Systems  Constitutional: Negative for fever.  Eyes: Positive for visual disturbance.  Cardiovascular: Positive for leg swelling.  Gastrointestinal: Positive for abdominal pain. Negative for constipation, diarrhea, nausea and vomiting.  Genitourinary: Negative for vaginal bleeding and vaginal discharge.  Neurological: Positive for headaches.   Physical Exam   Blood pressure 125/87, pulse 79, temperature 97.7 F (36.5 C), temperature source Oral, resp. rate 18, height 5\' 2"  (1.575 m), weight 261 lb (118.4 kg), last menstrual period 02/17/2016, unknown if currently breastfeeding.  Physical Exam  Nursing note and vitals reviewed. Constitutional: She is oriented to person, place, and time. She appears well-developed and well-nourished. No distress.  HENT:  Head: Normocephalic and atraumatic.   Cardiovascular: Normal rate.   Respiratory: Effort normal.  GI: Soft. She exhibits no distension and no mass. There is tenderness (mild RUQ tenderness to palpation). There is no rebound and no guarding.  Musculoskeletal: She exhibits edema (2+ pitting edema to the mid-shin bilaterally).  Neurological: She is alert and oriented to person, place, and time. She has normal reflexes.  No clonus  Skin: Skin is warm and dry. No erythema.  Psychiatric: She has a normal mood and affect.    Results for orders placed or performed during the hospital encounter of 10/08/16 (from the past 24 hour(s))  Urinalysis, Routine w reflex microscopic     Status: None   Collection Time: 10/08/16 12:20 PM  Result Value Ref Range   Color, Urine YELLOW YELLOW   APPearance CLEAR CLEAR   Specific Gravity, Urine 1.011 1.005 - 1.030   pH 5.0 5.0 - 8.0   Glucose, UA NEGATIVE NEGATIVE mg/dL   Hgb urine dipstick NEGATIVE NEGATIVE   Bilirubin Urine NEGATIVE NEGATIVE   Ketones, ur NEGATIVE NEGATIVE mg/dL   Protein, ur NEGATIVE NEGATIVE mg/dL   Nitrite NEGATIVE NEGATIVE   Leukocytes, UA NEGATIVE NEGATIVE  CBC with Differential/Platelet     Status: Abnormal   Collection Time: 10/08/16 12:25 PM  Result Value Ref Range   WBC 6.6 4.0 - 10.5 K/uL   RBC 3.70 (L) 3.87 - 5.11 MIL/uL   Hemoglobin 9.2 (L) 12.0 - 15.0 g/dL   HCT 16.1 (L) 09.6 - 04.5 %   MCV 74.9 (L) 78.0 - 100.0 fL   MCH 24.9 (L) 26.0 - 34.0 pg   MCHC 33.2 30.0 - 36.0 g/dL   RDW 40.9 81.1 - 91.4 %   Platelets 210 150 - 400 K/uL   Neutrophils Relative % 62 %   Neutro Abs 4.1 1.7 - 7.7 K/uL   Lymphocytes Relative 33 %   Lymphs Abs 2.2 0.7 - 4.0 K/uL   Monocytes Relative 4 %   Monocytes Absolute 0.3 0.1 - 1.0 K/uL   Eosinophils Relative 1 %   Eosinophils Absolute 0.1 0.0 - 0.7 K/uL   Basophils Relative 0 %   Basophils Absolute 0.0 0.0 - 0.1 K/uL   Fetal Monitoring: Baseline: 120 bpm Variability: moderate Accelerations: 15 x 15 Decelerations:  none Contractions: none  Patient Vitals for the past 24 hrs:  BP Temp Temp src Pulse Resp Height Weight  10/08/16 1531 (!) 111/58 - - 74 - - -  10/08/16 1516 118/65 - - 70 - - -  10/08/16 1502 (!) 141/77 - - 76 - - -  10/08/16 1500 (!) 144/70 - - 69 - - -  10/08/16 1401 (!) 146/82 - - 75 - - -  10/08/16 1346 139/77 - - 64 - - -  10/08/16 1331  125/62 - - 67 - - -  10/08/16 1322 (!) 141/92 - - 74 - - -  10/08/16 1231 (!) 147/93 - - 72 - - -  10/08/16 1228 (!) 142/79 - - 70 - - -  10/08/16 1218 125/87 97.7 F (36.5 C) Oral 79 18 5\' 2"  (1.575 m) 261 lb (118.4 kg)    MAU Course  Procedures None  MDM UA, CBC, CMP, Urine protein/creatinine ratio Serial BPs Discussed patient with Dr. Erin Fulling. Recommends Phenergan and Fioricet for headache.  Headache still rated at 4/10. Discussed with Dr. Erin Fulling. Recommends imitrex for headache.  Imitrex given. Patient reports headache 3/10.  Discussed with Dr. Erin Fulling. Ok for discharge with precautions. BP check on Tuesday. Assessment and Plan  A: SIUP at [redacted]w[redacted]d CHTN Headache  P: Discharge home Rx for Fioricet given  Pre-eclampsia/worsening HTN precautions discussed Patient advised to follow-up with [facility] on Tuesday for BP check  Patient may return to MAU as needed or if her condition were to change or worsen   Catherine Nipple, PA-C 10/08/2016, 12:50 PM

## 2016-10-08 NOTE — Progress Notes (Signed)
Patient here today for Routine OB visit 592w2d. Patient states she has been having brown discharge with odor for 2 weeks.

## 2016-10-08 NOTE — Patient Instructions (Signed)
Return to clinic for any scheduled appointments or obstetric concerns, or go to MAU for evaluation  

## 2016-10-08 NOTE — Discharge Instructions (Signed)
Hypertension During Pregnancy Hypertension is also called high blood pressure. High blood pressure means that the force of your blood moving in your body is too strong. When you are pregnant, this condition should be watched carefully. It can cause problems for you and your baby. Follow these instructions at home: Eating and drinking  Drink enough fluid to keep your pee (urine) clear or pale yellow.  Eat healthy foods that are low in salt (sodium). ? Do not add salt to your food. ? Check labels on foods and drinks to see much salt is in them. Look on the label where you see "Sodium." Lifestyle  Do not use any products that contain nicotine or tobacco, such as cigarettes and e-cigarettes. If you need help quitting, ask your doctor.  Do not use alcohol.  Avoid caffeine.  Avoid stress. Rest and get plenty of sleep. General instructions  Take over-the-counter and prescription medicines only as told by your doctor.  While lying down, lie on your left side. This keeps pressure off your baby.  While sitting or lying down, raise (elevate) your feet. Try putting some pillows under your lower legs.  Exercise regularly. Ask your doctor what kinds of exercise are best for you.  Keep all prenatal and follow-up visits as told by your doctor. This is important. Contact a doctor if:  You have symptoms that your doctor told you to watch for, such as: ? Fever. ? Throwing up (vomiting). ? Headache. Get help right away if:  You have very bad pain in your belly (abdomen).  You are throwing up, and this does not get better with treatment.  You suddenly get swelling in your hands, ankles, or face.  You gain 4 lb (1.8 kg) or more in 1 week.  You get bleeding from your vagina.  You have blood in your pee.  You do not feel your baby moving as much as normal.  You have a change in vision.  You have muscle twitching or sudden tightening (spasms).  You have trouble breathing.  Your lips  or fingernails turn blue. This information is not intended to replace advice given to you by your health care provider. Make sure you discuss any questions you have with your health care provider. Document Released: 02/28/2010 Document Revised: 10/08/2015 Document Reviewed: 10/08/2015 Elsevier Interactive Patient Education  2017 [employer] Preeclampsia and Eclampsia Preeclampsia is a serious condition that develops only during pregnancy. It is also called toxemia of pregnancy. This condition causes high blood pressure along with other symptoms, such as swelling and headaches. These symptoms may develop as the condition gets worse. Preeclampsia may occur at 20 weeks of pregnancy or later. Diagnosing and treating preeclampsia early is very important. If not treated early, it can cause serious problems for you and your baby. One problem it can lead to is eclampsia, which is a condition that causes muscle jerking or shaking (convulsions or seizures) in the mother. Delivering your baby is the best treatment for preeclampsia or eclampsia. Preeclampsia and eclampsia symptoms usually go away after your baby is born. What are the causes? The cause of preeclampsia is not known. What increases the risk? The following risk factors make you more likely to develop preeclampsia:  Being pregnant for the first time.  Having had preeclampsia during a past pregnancy.  Having a family history of preeclampsia.  Having high blood pressure.  Being pregnant with twins or triplets.  Being 5335 or older.  Being African-American.  Having kidney disease or diabetes.  Having medical conditions such as lupus or blood diseases. °· Being very overweight (obese). ° °What are the signs or symptoms? °The earliest signs of preeclampsia are: °· High blood pressure. °· Increased protein in your urine. Your health care provider will check for this at every visit before you give birth (prenatal visit). ° °Other symptoms that  may develop as the condition gets worse include: °· Severe headaches. °· Sudden weight gain. °· Swelling of the hands, face, legs, and feet. °· Nausea and vomiting. °· Vision problems, such as blurred or double vision. °· Numbness in the face, arms, legs, and feet. °· Urinating less than usual. °· Dizziness. °· Slurred speech. °· Abdominal pain, especially upper abdominal pain. °· Convulsions or seizures. ° °Symptoms generally go away after giving birth. °How is this diagnosed? °There are no screening tests for preeclampsia. Your health care provider will ask you about symptoms and check for signs of preeclampsia during your prenatal visits. You may also have tests that include: °· Urine tests. °· Blood tests. °· Checking your blood pressure. °· Monitoring your baby’s heart rate. °· Ultrasound. ° °How is this treated? °You and your health care provider will determine the treatment approach that is best for you. Treatment may include: °· Having more frequent prenatal exams to check for signs of preeclampsia, if you have an increased risk for preeclampsia. °· Bed rest. °· Reducing how much salt (sodium) you eat. °· Medicine to lower your blood pressure. °· Staying in the hospital, if your condition is severe. There, treatment will focus on controlling your blood pressure and the amount of fluids in your body (fluid retention). °· You may need to take medicine (magnesium sulfate) to prevent seizures. This medicine may be given as an injection or through an IV tube. °· Delivering your baby early, if your condition gets worse. You may have your labor started with medicine (induced), or you may have a cesarean delivery. ° °Follow these instructions at home: °Eating and drinking ° °· Drink enough fluid to keep your urine clear or pale yellow. °· Eat a healthy diet that is low in sodium. Do not add salt to your food. Check nutrition labels to see how much sodium a food or beverage contains. °· Avoid  caffeine. °Lifestyle °· Do not use any products that contain nicotine or tobacco, such as cigarettes and e-cigarettes. If you need help quitting, ask your health care provider. °· Do not use alcohol or drugs. °· Avoid stress as much as possible. Rest and get plenty of sleep. °General instructions °· Take over-the-counter and prescription medicines only as told by your health care provider. °· When lying down, lie on your side. This keeps pressure off of your baby. °· When sitting or lying down, raise (elevate) your feet. Try putting some pillows underneath your lower legs. °· Exercise regularly. Ask your health care provider what kinds of exercise are best for you. °· Keep all follow-up and prenatal visits as told by your health care provider. This is important. °How is this prevented? °To prevent preeclampsia or eclampsia from developing during another pregnancy: °· Get proper medical care during pregnancy. Your health care provider may be able to prevent preeclampsia or diagnose and treat it early. °· Your health care provider may have you take a low-dose aspirin or a calcium supplement during your next pregnancy. °· You may have tests of your blood pressure and kidney function after giving birth. °· Maintain a healthy weight. Ask your health care provider for   help managing weight gain during pregnancy. °· Work with your health care provider to manage any long-term (chronic) health conditions you have, such as diabetes or kidney problems. ° °Contact a health care provider if: °· You gain more weight than expected. °· You have headaches. °· You have nausea or vomiting. °· You have abdominal pain. °· You feel dizzy or light-headed. °Get help right away if: °· You develop sudden or severe swelling anywhere in your body. This usually happens in the legs. °· You gain 5 lbs (2.3 kg) or more during one week. °· You have severe: °? Abdominal pain. °? Headaches. °? Dizziness. °? Vision problems. °? Confusion. °? Nausea or  vomiting. °· You have a seizure. °· You have trouble moving any part of your body. °· You develop numbness in any part of your body. °· You have trouble speaking. °· You have any abnormal bleeding. °· You pass out. °This information is not intended to replace advice given to you by your health care provider. Make sure you discuss any questions you have with your health care provider. °Document Released: 01/24/2000 Document Revised: 09/24/2015 Document Reviewed: 09/02/2015 °Elsevier Interactive Patient Education © 2018 Elsevier Inc. ° °
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
## 2016-10-08 NOTE — MAU Note (Signed)
Pt c/o headache x 3 days. B/P elevated at office visit . Sent for [facility] eval

## 2016-10-09 LAB — GLUCOSE TOLERANCE, 2 HOURS W/ 1HR
GLUCOSE, FASTING: 89 mg/dL (ref 65–91)
Glucose, 1 hour: 125 mg/dL (ref 65–179)
Glucose, 2 hour: 90 mg/dL (ref 65–152)

## 2016-10-09 LAB — HIV ANTIBODY (ROUTINE TESTING W REFLEX): HIV SCREEN 4TH GENERATION: REACTIVE — AB

## 2016-10-09 LAB — CBC
HEMOGLOBIN: 9.3 g/dL — AB (ref 11.1–15.9)
Hematocrit: 29.1 % — ABNORMAL LOW (ref 34.0–46.6)
MCH: 25.1 pg — ABNORMAL LOW (ref 26.6–33.0)
MCHC: 32 g/dL (ref 31.5–35.7)
MCV: 79 fL (ref 79–97)
Platelets: 231 10*3/uL (ref 150–379)
RBC: 3.7 x10E6/uL — ABNORMAL LOW (ref 3.77–5.28)
RDW: 14.4 % (ref 12.3–15.4)
WBC: 6.8 10*3/uL (ref 3.4–10.8)

## 2016-10-09 LAB — COMPREHENSIVE METABOLIC PANEL
ALBUMIN: 3.2 g/dL — AB (ref 3.5–5.5)
ALT: 10 IU/L (ref 0–32)
AST: 8 IU/L (ref 0–40)
Albumin/Globulin Ratio: 1.2 (ref 1.2–2.2)
Alkaline Phosphatase: 82 IU/L (ref 39–117)
BUN / CREAT RATIO: 9 (ref 9–23)
BUN: 5 mg/dL — ABNORMAL LOW (ref 6–20)
CHLORIDE: 107 mmol/L — AB (ref 96–106)
CO2: 18 mmol/L — ABNORMAL LOW (ref 20–29)
Calcium: 8.7 mg/dL (ref 8.7–10.2)
Creatinine, Ser: 0.55 mg/dL — ABNORMAL LOW (ref 0.57–1.00)
GFR calc non Af Amer: 125 mL/min/{1.73_m2} (ref >59)
GFR, EST AFRICAN AMERICAN: 145 mL/min/{1.73_m2} (ref >59)
Globulin, Total: 2.7 g/dL (ref 1.5–4.5)
Glucose: 92 mg/dL (ref 65–99)
POTASSIUM: 4.2 mmol/L (ref 3.5–5.2)
SODIUM: 140 mmol/L (ref 134–144)
TOTAL PROTEIN: 5.9 g/dL — AB (ref 6.0–8.5)

## 2016-10-09 LAB — PROTEIN / CREATININE RATIO, URINE
Creatinine, Urine: 82.1 mg/dL
PROTEIN/CREAT RATIO: 133 mg/g{creat} (ref 0–200)
Protein, Ur: 10.9 mg/dL

## 2016-10-09 LAB — HIV 1/2 AB DIFFERENTIATION
HIV 1 Ab: POSITIVE — AB
HIV 2 AB: NEGATIVE
NOTE (HIV CONF MULTISPOT): POSITIVE

## 2016-10-09 LAB — CERVICOVAGINAL ANCILLARY ONLY
BACTERIAL VAGINITIS: NEGATIVE
CANDIDA VAGINITIS: NEGATIVE
CHLAMYDIA, DNA PROBE: NEGATIVE
Neisseria Gonorrhea: NEGATIVE
TRICH (WINDOWPATH): NEGATIVE

## 2016-10-09 LAB — RPR: RPR: NONREACTIVE

## 2016-10-13 ENCOUNTER — Ambulatory Visit: Payer: Self-pay

## 2016-10-15 ENCOUNTER — Ambulatory Visit: Payer: Self-pay | Admitting: Internal Medicine

## 2016-10-20 ENCOUNTER — Ambulatory Visit (INDEPENDENT_AMBULATORY_CARE_PROVIDER_SITE_OTHER): Payer: Medicaid Other | Admitting: Pharmacist Clinician (PhC)/ Clinical Pharmacy Specialist

## 2016-10-20 DIAGNOSIS — B20 Human immunodeficiency virus [HIV] disease: Secondary | ICD-10-CM | POA: Diagnosis present

## 2016-10-20 NOTE — Progress Notes (Signed)
Agreed with Catherine Simmons's note. We had to call her today to bring her in because she missed the appt last week.[place] a [place] in [place] now, therefore, she requested the appt to be late in the afternoon.  Overall, she is taking her meds and we are hopeful that she will be suppressed today.   Ulyses SouthwardMinh Pham, PharmD, BCPS, AAHIVP, CPP Infectious Disease Pharmacist Pager: [phone] 10/20/2016 11:29 AM

## 2016-10-20 NOTE — Progress Notes (Signed)
HPI: Catherine Simmons is a 31 y.o. female who reports to the pharmacy clinic for a follow-up on her HIV disease.   Allergies: Allergies  Allergen Reactions  . Stadol [Butorphanol Tartrate] Itching    Tolerates percocet    Vitals:    Past Medical History: Past Medical History:  Diagnosis Date  . Anemia   . Cholecystitis 07/16/10  . Genital warts 2004  . Hemorrhoids   . HIV (human immunodeficiency virus infection) (HCC)   . Hx of pelvic inflammatory disease 08/31/2013   And hx of multiple STDs   . Hypertension   . Pregnancy induced hypertension   . Sickle cell trait (HCC)     Social History: Social History   Social History  . Marital status: Single    Spouse name: N/A  . Number of children: N/A  . Years of education: N/A   Social History Main Topics  . Smoking status: Former Smoker    Packs/day: 0.30    Types: Cigarettes    Start date: 03/11/2012  . Smokeless tobacco: Never Used  . Alcohol use No     Comment: occ  . Drug use: No  . Sexual activity: Yes    Partners: Male    Birth control/ protection: None   Other Topics Concern  . Not on file   Social History Narrative  . No narrative on file    Previous Regimen: Truvada/Isentress  Current Regimen: Truvada/Isentress  Labs: HIV 1 RNA Quant (copies/mL)  Date Value  06/11/2015 <20  04/24/2015 <20  01/23/2015 691 (H)   HIV-1 RNA Viral Load (no units)  Date Value  05/15/2014 <40  04/09/2014 <40  02/19/2014 <20   CD4 (no units)  Date Value  04/09/2014 1,016  02/19/2014 776  12/27/2013 651   CD4 T Cell Abs (/uL)  Date Value  08/20/2016 670  07/10/2016 720  06/11/2015 870   Hep B S Ab (no units)  Date Value  10/20/2012 POS (A)   Hepatitis B Surface Ag (no units)  Date Value  07/02/2016 Negative   HCV Ab (no units)  Date Value  09/28/2013 NEGATIVE    CrCl: Estimated Creatinine Clearance: 124.5 mL/min (by C-G formula based on SCr of 0.46 mg/dL).  Lipids:    Component Value  Date/Time   CHOL 237 12/27/2013   TRIG 61 10/20/2012 1057   HDL 50 10/20/2012 1057   CHOLHDL 2.9 10/20/2012 1057   VLDL 12 10/20/2012 1057   LDLCALC 139 12/27/2013    Assessment: Catherine Simmons reports today for a follow-up on her HIV disease. She is currently pregnant with her 5th child and has a due date of November 20th. Catherine Simmons has been doing well with her HIV regimen. She reports missing 3 doses over the months since she started Isentress/Truvada in July. She takes her medications at night before bedtime because they make her feel sick when she takes them during the day. I talked to her about potentially setting an alarm for her medications because her missed doses were due to forgetting the medication.   [facility] viral load today to ensure her virus is well-controlled. We scheduled a follow-up appointment with Dr. Drue SecondSnider in November prior to her due date.   [facility] about 2 weeks after she gives birth so that we can start setting her up for financial assistance for her meds. It is expected that her pregnancy Medicaid will lapse soon after she gives birth.  Recommendations: - Viral load today - Continue taking Truvada/Isentress - Follow-up with Dr. Drue Second on November 5th - Please contact the clinic about 2 weeks after you give birth so we can start setting up financial assistance for your meds  Della Goo, PharmD PGY2 Infectious Diseases Pharmacy Resident  Pager: [phone] 10/20/2016, 10:14 AM

## 2016-10-20 NOTE — Patient Instructions (Signed)
Today we will check your viral load to make sure your therapy is working for you.  Continue taking your Isentress/Truvada every day.  Follow-up with Dr. Drue SecondSnider in November.  Please follow-up with the clinic about 2 weeks after you deliver your baby to get financial assistance set-up for your medications.

## 2016-10-22 ENCOUNTER — Inpatient Hospital Stay (HOSPITAL_COMMUNITY)
Admission: AD | Admit: 2016-10-22 | Discharge: 2016-10-22 | Disposition: A | Discharge status: Home / Self Care | Payer: Medicaid Other | Source: Ambulatory Visit | Attending: Obstetrics and Gynecology | Admitting: Obstetrics and Gynecology

## 2016-10-22 ENCOUNTER — Ambulatory Visit (INDEPENDENT_AMBULATORY_CARE_PROVIDER_SITE_OTHER): Payer: Medicaid Other | Admitting: Advanced Practice Midwife

## 2016-10-22 ENCOUNTER — Encounter (HOSPITAL_COMMUNITY): Payer: Self-pay

## 2016-10-22 DIAGNOSIS — Z23 Encounter for immunization: Secondary | ICD-10-CM

## 2016-10-22 DIAGNOSIS — Z885 Allergy status to narcotic agent status: Secondary | ICD-10-CM | POA: Insufficient documentation

## 2016-10-22 DIAGNOSIS — Z87891 Personal history of nicotine dependence: Secondary | ICD-10-CM | POA: Insufficient documentation

## 2016-10-22 DIAGNOSIS — D573 Sickle-cell trait: Secondary | ICD-10-CM | POA: Diagnosis not present

## 2016-10-22 DIAGNOSIS — Z3689 Encounter for other specified antenatal screening: Secondary | ICD-10-CM

## 2016-10-22 DIAGNOSIS — O0990 Supervision of high risk pregnancy, unspecified, unspecified trimester: Secondary | ICD-10-CM

## 2016-10-22 DIAGNOSIS — O10013 Pre-existing essential hypertension complicating pregnancy, third trimester: Secondary | ICD-10-CM | POA: Diagnosis not present

## 2016-10-22 DIAGNOSIS — O368130 Decreased fetal movements, third trimester, not applicable or unspecified: Secondary | ICD-10-CM | POA: Insufficient documentation

## 2016-10-22 DIAGNOSIS — Z7982 Long term (current) use of aspirin: Secondary | ICD-10-CM | POA: Insufficient documentation

## 2016-10-22 DIAGNOSIS — O0993 Supervision of high risk pregnancy, unspecified, third trimester: Secondary | ICD-10-CM

## 2016-10-22 DIAGNOSIS — O10913 Unspecified pre-existing hypertension complicating pregnancy, third trimester: Secondary | ICD-10-CM

## 2016-10-22 DIAGNOSIS — Z3A30 30 weeks gestation of pregnancy: Secondary | ICD-10-CM | POA: Insufficient documentation

## 2016-10-22 DIAGNOSIS — Z9049 Acquired absence of other specified parts of digestive tract: Secondary | ICD-10-CM | POA: Diagnosis not present

## 2016-10-22 LAB — COMPREHENSIVE METABOLIC PANEL
ALK PHOS: 84 U/L (ref 38–126)
ALT: 11 U/L — AB (ref 14–54)
AST: 11 U/L — AB (ref 15–41)
Albumin: 2.7 g/dL — ABNORMAL LOW (ref 3.5–5.0)
Anion gap: 7 (ref 5–15)
CALCIUM: 8.3 mg/dL — AB (ref 8.9–10.3)
CO2: 21 mmol/L — AB (ref 22–32)
CREATININE: 0.44 mg/dL (ref 0.44–1.00)
Chloride: 110 mmol/L (ref 101–111)
GFR calc Af Amer: >60 mL/min (ref >60)
GFR calc non Af Amer: >60 mL/min (ref >60)
GLUCOSE: 85 mg/dL (ref 65–99)
Potassium: 3.8 mmol/L (ref 3.5–5.1)
SODIUM: 138 mmol/L (ref 135–145)
Total Bilirubin: 0.3 mg/dL (ref 0.3–1.2)
Total Protein: 6.4 g/dL — ABNORMAL LOW (ref 6.5–8.1)

## 2016-10-22 LAB — CBC
HCT: 27.8 % — ABNORMAL LOW (ref 36.0–46.0)
Hemoglobin: 9.2 g/dL — ABNORMAL LOW (ref 12.0–15.0)
MCH: 24.3 pg — AB (ref 26.0–34.0)
MCHC: 33.1 g/dL (ref 30.0–36.0)
MCV: 73.5 fL — AB (ref 78.0–100.0)
PLATELETS: 206 10*3/uL (ref 150–400)
RBC: 3.78 MIL/uL — AB (ref 3.87–5.11)
RDW: 13.7 % (ref 11.5–15.5)
WBC: 7.4 10*3/uL (ref 4.0–10.5)

## 2016-10-22 LAB — URINALYSIS, ROUTINE W REFLEX MICROSCOPIC
Bilirubin Urine: NEGATIVE
GLUCOSE, UA: NEGATIVE mg/dL
Hgb urine dipstick: NEGATIVE
Ketones, ur: 20 mg/dL — AB
LEUKOCYTES UA: NEGATIVE
Nitrite: NEGATIVE
Protein, ur: NEGATIVE mg/dL
SPECIFIC GRAVITY, URINE: 1.011 (ref 1.005–1.030)
pH: 5 (ref 5.0–8.0)

## 2016-10-22 LAB — PROTEIN / CREATININE RATIO, URINE
Creatinine, Urine: 77 mg/dL
Protein Creatinine Ratio: 0.09 mg/mg{Cre} (ref 0.00–0.15)
Total Protein, Urine: 7 mg/dL

## 2016-10-22 LAB — HIV-1 RNA QUANT-NO REFLEX-BLD
HIV 1 RNA QUANT: NOT DETECTED {copies}/mL
HIV-1 RNA QUANT, LOG: NOT DETECTED {Log_copies}/mL

## 2016-10-22 MED ORDER — LABETALOL HCL 100 MG PO TABS: 100.0000 mg | ORAL_TABLET | Freq: Two times a day (BID) | ORAL | 0 refills | Status: DC

## 2016-10-22 NOTE — Discharge Instructions (Signed)
Hypertension During Pregnancy °Hypertension, commonly called high blood pressure, is when the force of blood pumping through your arteries is too strong. Arteries are blood vessels that carry blood from the heart throughout the body. Hypertension during pregnancy can cause problems for you and your baby. Your baby may be born early (prematurely) or may not weigh as much as he or she should at birth. Very bad cases of hypertension during pregnancy can be life-threatening. °Different types of hypertension can occur during pregnancy. These include: °· Chronic hypertension. This happens when: °? You have hypertension before pregnancy and it continues during pregnancy. °? You develop hypertension before you are [redacted] weeks pregnant, and it continues during pregnancy. °· Gestational hypertension. This is hypertension that develops after the 20th week of pregnancy. °· Preeclampsia, also called toxemia of pregnancy. This is a very serious type of hypertension that develops only during pregnancy. It affects the whole body, and it can be very dangerous for you and your baby. ° °Gestational hypertension and preeclampsia usually go away within 6 weeks after your baby is born. Women who have hypertension during pregnancy have a greater chance of developing hypertension later in life or during future pregnancies. °What are the causes? °The exact cause of hypertension is not known. °What increases the risk? °There are certain factors that make it more likely for you to develop hypertension during pregnancy. These include: °· Having hypertension during a previous pregnancy or prior to pregnancy. °· Being overweight. °· Being older than age 40. °· Being pregnant for the first time or being pregnant with more than one baby. °· Becoming pregnant using fertilization methods such as IVF (in vitro fertilization). °· Having diabetes, kidney problems, or systemic lupus erythematosus. °· Having a family history of hypertension. ° °What are the  signs or symptoms? °Chronic hypertension and gestational hypertension rarely cause symptoms. Preeclampsia causes symptoms, which may include: °· Increased protein in your urine. Your health care provider will check for this at every visit before you give birth (prenatal visit). °· Severe headaches. °· Sudden weight gain. °· Swelling of the hands, face, legs, and feet. °· Nausea and vomiting. °· Vision problems, such as blurred or double vision. °· Numbness in the face, arms, legs, and feet. °· Dizziness. °· Slurred speech. °· Sensitivity to bright lights. °· Abdominal pain. °· Convulsions. ° °How is this diagnosed? °You may be diagnosed with hypertension during a routine prenatal exam. At each prenatal visit, you may: °· Have a urine test to check for high amounts of protein in your urine. °· Have your blood pressure checked. A blood pressure reading is recorded as two numbers, such as "120 over 80" (or 120/80). The first ("top") number is called the systolic pressure. It is a measure of the pressure in your arteries when your heart beats. The second ("bottom") number is called the diastolic pressure. It is a measure of the pressure in your arteries as your heart relaxes between beats. Blood pressure is measured in a unit called mm Hg. A normal blood pressure reading is: °? Systolic: below 120. °? Diastolic: below 80. ° °The type of hypertension that you are diagnosed with depends on your test results and when your symptoms developed. °· Chronic hypertension is usually diagnosed before 20 weeks of pregnancy. °· Gestational hypertension is usually diagnosed after 20 weeks of pregnancy. °· Hypertension with high amounts of protein in the urine is diagnosed as preeclampsia. °· Blood pressure measurements that stay above 160 systolic, or above 110 diastolic, are   signs of severe preeclampsia. ° °How is this treated? °Treatment for hypertension during pregnancy varies depending on the type of hypertension you have and how  serious it is. °· If you take medicines called ACE inhibitors to treat chronic hypertension, you may need to switch medicines. ACE inhibitors should not be taken during pregnancy. °· If you have gestational hypertension, you may need to take blood pressure medicine. °· If you are at risk for preeclampsia, your health care provider may recommend that you take a low-dose aspirin every day to prevent high blood pressure during your pregnancy. °· If you have severe preeclampsia, you may need to be hospitalized so you and your baby can be monitored closely. You may also need to take medicine (magnesium sulfate) to prevent seizures and to lower blood pressure. This medicine may be given as an injection or through an IV tube. °· In some cases, if your condition gets worse, you may need to deliver your baby early. ° °Follow these instructions at home: °Eating and drinking °· Drink enough fluid to keep your urine clear or pale yellow. °· Eat a healthy diet that is low in salt (sodium). Do not add salt to your food. Check food labels to see how much sodium a food or beverage contains. °Lifestyle °· Do not use any products that contain nicotine or tobacco, such as cigarettes and e-cigarettes. If you need help quitting, ask your health care provider. °· Do not use alcohol. °· Avoid caffeine. °· Avoid stress as much as possible. Rest and get plenty of sleep. °General instructions °· Take over-the-counter and prescription medicines only as told by your health care provider. °· While lying down, lie on your left side. This keeps pressure off your baby. °· While sitting or lying down, raise (elevate) your feet. Try putting some pillows under your lower legs. °· Exercise regularly. Ask your health care provider what kinds of exercise are best for you. °· Keep all prenatal and follow-up visits as told by your health care provider. This is important. °Contact a health care provider if: °· You have symptoms that your health care  provider told you may require more treatment or monitoring, such as: °? Fever. °? Vomiting. °? Headache. °Get help right away if: °· You have severe abdominal pain or vomiting that does not get better with treatment. °· You suddenly develop swelling in your hands, ankles, or face. °· You gain 4 lbs (1.8 kg) or more in 1 week. °· You develop vaginal bleeding, or you have blood in your urine. °· You do not feel your baby moving as much as usual. °· You have blurred or double vision. °· You have muscle twitching or sudden tightening (spasms). °· You have shortness of breath. °· Your lips or fingernails turn blue. °This information is not intended to replace advice given to you by your health care provider. Make sure you discuss any questions you have with your health care provider. °Document Released: 10/14/2010 Document Revised: 08/16/2015 Document Reviewed: 07/12/2015 °Elsevier Interactive Patient Education © 2018 [employer] ° °Fetal Movement Counts °Patient Name: ________________________________________________ Patient Due Date: ____________________ °What is a fetal movement count? °A fetal movement count is the number of times that you feel your baby move during a certain amount of time. This may also be called a fetal kick count. A fetal movement count is recommended for every pregnant woman. You may be asked to start counting fetal movements as early as week 28 of your pregnancy. °Pay attention to   when your baby is most active. You may notice your baby's sleep and wake cycles. You may also notice things that make your baby move more. You should do a fetal movement count: °· When your baby is normally most active. °· At the same time each day. ° °A good time to count movements is while you are resting, after having something to eat and drink. °How do I count fetal movements? °1. Find a quiet, comfortable area. Sit, or lie down on your side. °2. Write down the date, the start time and stop time, and the number  of movements that you felt between those two times. Take this information with you to your health care visits. °3. For 2 hours, count kicks, flutters, swishes, rolls, and jabs. You should feel at least 10 movements during 2 hours. °4. You may stop counting after you have felt 10 movements. °5. If you do not feel 10 movements in 2 hours, have something to eat and drink. Then, keep resting and counting for 1 hour. If you feel at least 4 movements during that hour, you may stop counting. °Contact a health care provider if: °· You feel fewer than 4 movements in 2 hours. °· Your baby is not moving like he or she usually does. °Date: ____________ Start time: ____________ Stop time: ____________ Movements: ____________ °Date: ____________ Start time: ____________ Stop time: ____________ Movements: ____________ °Date: ____________ Start time: ____________ Stop time: ____________ Movements: ____________ °Date: ____________ Start time: ____________ Stop time: ____________ Movements: ____________ °Date: ____________ Start time: ____________ Stop time: ____________ Movements: ____________ °Date: ____________ Start time: ____________ Stop time: ____________ Movements: ____________ °Date: ____________ Start time: ____________ Stop time: ____________ Movements: ____________ °Date: ____________ Start time: ____________ Stop time: ____________ Movements: ____________ °Date: ____________ Start time: ____________ Stop time: ____________ Movements: ____________ °This information is not intended to replace advice given to you by your health care provider. Make sure you discuss any questions you have with your health care provider. °Document Released: 02/25/2006 Document Revised: 09/25/2015 Document Reviewed: 03/07/2015 °Elsevier Interactive Patient Education © 2018 [employer] ° °

## 2016-10-22 NOTE — Patient Instructions (Signed)
Hypertension During Pregnancy °Hypertension, commonly called high blood pressure, is when the force of blood pumping through your arteries is too strong. Arteries are blood vessels that carry blood from the heart throughout the body. Hypertension during pregnancy can cause problems for you and your baby. Your baby may be born early (prematurely) or may not weigh as much as he or she should at birth. Very bad cases of hypertension during pregnancy can be life-threatening. °Different types of hypertension can occur during pregnancy. These include: °· Chronic hypertension. This happens when: °? You have hypertension before pregnancy and it continues during pregnancy. °? You develop hypertension before you are [redacted] weeks pregnant, and it continues during pregnancy. °· Gestational hypertension. This is hypertension that develops after the 20th week of pregnancy. °· Preeclampsia, also called toxemia of pregnancy. This is a very serious type of hypertension that develops only during pregnancy. It affects the whole body, and it can be very dangerous for you and your baby. ° °Gestational hypertension and preeclampsia usually go away within 6 weeks after your baby is born. Women who have hypertension during pregnancy have a greater chance of developing hypertension later in life or during future pregnancies. °What are the causes? °The exact cause of hypertension is not known. °What increases the risk? °There are certain factors that make it more likely for you to develop hypertension during pregnancy. These include: °· Having hypertension during a previous pregnancy or prior to pregnancy. °· Being overweight. °· Being older than age 40. °· Being pregnant for the first time or being pregnant with more than one baby. °· Becoming pregnant using fertilization methods such as IVF (in vitro fertilization). °· Having diabetes, kidney problems, or systemic lupus erythematosus. °· Having a family history of hypertension. ° °What are the  signs or symptoms? °Chronic hypertension and gestational hypertension rarely cause symptoms. Preeclampsia causes symptoms, which may include: °· Increased protein in your urine. Your health care provider will check for this at every visit before you give birth (prenatal visit). °· Severe headaches. °· Sudden weight gain. °· Swelling of the hands, face, legs, and feet. °· Nausea and vomiting. °· Vision problems, such as blurred or double vision. °· Numbness in the face, arms, legs, and feet. °· Dizziness. °· Slurred speech. °· Sensitivity to bright lights. °· Abdominal pain. °· Convulsions. ° °How is this diagnosed? °You may be diagnosed with hypertension during a routine prenatal exam. At each prenatal visit, you may: °· Have a urine test to check for high amounts of protein in your urine. °· Have your blood pressure checked. A blood pressure reading is recorded as two numbers, such as "120 over 80" (or 120/80). The first ("top") number is called the systolic pressure. It is a measure of the pressure in your arteries when your heart beats. The second ("bottom") number is called the diastolic pressure. It is a measure of the pressure in your arteries as your heart relaxes between beats. Blood pressure is measured in a unit called mm Hg. A normal blood pressure reading is: °? Systolic: below 120. °? Diastolic: below 80. ° °The type of hypertension that you are diagnosed with depends on your test results and when your symptoms developed. °· Chronic hypertension is usually diagnosed before 20 weeks of pregnancy. °· Gestational hypertension is usually diagnosed after 20 weeks of pregnancy. °· Hypertension with high amounts of protein in the urine is diagnosed as preeclampsia. °· Blood pressure measurements that stay above 160 systolic, or above 110 diastolic, are   signs of severe preeclampsia. ° °How is this treated? °Treatment for hypertension during pregnancy varies depending on the type of hypertension you have and how  serious it is. °· If you take medicines called ACE inhibitors to treat chronic hypertension, you may need to switch medicines. ACE inhibitors should not be taken during pregnancy. °· If you have gestational hypertension, you may need to take blood pressure medicine. °· If you are at risk for preeclampsia, your health care provider may recommend that you take a low-dose aspirin every day to prevent high blood pressure during your pregnancy. °· If you have severe preeclampsia, you may need to be hospitalized so you and your baby can be monitored closely. You may also need to take medicine (magnesium sulfate) to prevent seizures and to lower blood pressure. This medicine may be given as an injection or through an IV tube. °· In some cases, if your condition gets worse, you may need to deliver your baby early. ° °Follow these instructions at home: °Eating and drinking °· Drink enough fluid to keep your urine clear or pale yellow. °· Eat a healthy diet that is low in salt (sodium). Do not add salt to your food. Check food labels to see how much sodium a food or beverage contains. °Lifestyle °· Do not use any products that contain nicotine or tobacco, such as cigarettes and e-cigarettes. If you need help quitting, ask your health care provider. °· Do not use alcohol. °· Avoid caffeine. °· Avoid stress as much as possible. Rest and get plenty of sleep. °General instructions °· Take over-the-counter and prescription medicines only as told by your health care provider. °· While lying down, lie on your left side. This keeps pressure off your baby. °· While sitting or lying down, raise (elevate) your feet. Try putting some pillows under your lower legs. °· Exercise regularly. Ask your health care provider what kinds of exercise are best for you. °· Keep all prenatal and follow-up visits as told by your health care provider. This is important. °Contact a health care provider if: °· You have symptoms that your health care  provider told you may require more treatment or monitoring, such as: °? Fever. °? Vomiting. °? Headache. °Get help right away if: °· You have severe abdominal pain or vomiting that does not get better with treatment. °· You suddenly develop swelling in your hands, ankles, or face. °· You gain 4 lbs (1.8 kg) or more in 1 week. °· You develop vaginal bleeding, or you have blood in your urine. °· You do not feel your baby moving as much as usual. °· You have blurred or double vision. °· You have muscle twitching or sudden tightening (spasms). °· You have shortness of breath. °· Your lips or fingernails turn blue. °This information is not intended to replace advice given to you by your health care provider. Make sure you discuss any questions you have with your health care provider. °Document Released: 10/14/2010 Document Revised: 08/16/2015 Document Reviewed: 07/12/2015 °[employer] Interactive Patient Education © 2018 [employer] ° °

## 2016-10-22 NOTE — Progress Notes (Signed)
Patient is here today for routine OB visit [id number]. Patient complains of numbness in her hands.

## 2016-10-22 NOTE — MAU Provider Note (Signed)
History     CSN: [id number]  Arrival date and time: 10/22/16 1623  First Provider Initiated Contact with Patient 10/22/16 1654      Chief Complaint  Patient presents with  . Hypertension  . Decreased Fetal Movement   HPI Catherine Simmons is a 31 y.o. G5P4004 at 7323w2d who presents from the office for BP evaluation & decreased fetal movement. Hx of chronic hypertension, taking baby aspirin daily. BPs have been increasing over the last 2 visits. Today BPs 150s/90s in clinic. Denies headache, visual disturbance, dizziness, or epigastric pain.  Reports decreased fetal movement. States fetal movement is decreased from previous pregnancies. States she never feels baby move as much as she expects. Has felt movement today. No change in movement today. Denies abdominal pain, vaginal bleeding, or LOF.   OB History    Gravida Para Term Preterm AB Living   5 4 4  0   4   SAB TAB Ectopic Multiple Live Births         0 4      Past Medical History:  Diagnosis Date  . Anemia   . Cholecystitis 07/16/10  . Genital warts 2004  . Hemorrhoids   . HIV (human immunodeficiency virus infection) (HCC)   . Hx of pelvic inflammatory disease 08/31/2013   And hx of multiple STDs   . Hypertension   . Pregnancy induced hypertension   . Sickle cell trait [facility](HCC)     Past Surgical History:  Procedure Laterality Date  . CHOLECYSTECTOMY    . WISDOM TOOTH EXTRACTION      Family History  Problem Relation Age of Onset  . Cancer Mother   . Hypertension Mother   . Diabetes Mother   . Pancreatitis Mother   . Hypertension Father   . Diabetes Maternal Aunt     Social History  Substance Use Topics  . Smoking status: Former Smoker    Packs/day: 0.30    Types: Cigarettes    Start date: 03/11/2012  . Smokeless tobacco: Never Used  . Alcohol use No     Comment: occ    Allergies:  Allergies  Allergen Reactions  . Stadol [Butorphanol Tartrate] Itching    Tolerates percocet    Prescriptions Prior to  Admission  Medication Sig Dispense Refill Last Dose  . acetaminophen (TYLENOL) 500 MG tablet Take 1,000 mg by mouth every 6 (six) hours as needed for mild pain, moderate pain or headache.    10/21/2016 at Unknown time  . aspirin 81 MG chewable tablet Chew 1 tablet (81 mg total) by mouth daily. 60 tablet 3 10/21/2016 at Unknown time  . docusate sodium (COLACE) 100 MG capsule Take 1 capsule (100 mg total) by mouth 2 (two) times daily as needed. (Patient taking differently: Take 100 mg by mouth 2 (two) times daily as needed for mild constipation. ) 60 capsule 3 10/21/2016 at Unknown time  . emtricitabine-tenofovir (TRUVADA) 200-300 MG tablet Take 1 tablet by mouth daily. 30 tablet 11 10/21/2016 at Unknown time  . ondansetron (ZOFRAN ODT) 8 MG disintegrating tablet Take 1 tablet (8 mg total) by mouth every 8 (eight) hours as needed for nausea or vomiting. 40 tablet 4 10/22/2016 at Unknown time  . Prenatal Vit-Fe Fumarate-FA (PRENATAL VITAMIN) 27-0.8 MG TABS Take 1 tablet by mouth daily. 30 tablet 6 10/21/2016 at Unknown time  . Raltegravir Potassium (ISENTRESS HD) 600 MG TABS Take 2 tablets by mouth daily. 60 tablet 11 10/21/2016 at Unknown time    Review of  Systems  Constitutional: Negative.   Eyes: Negative for visual disturbance.  Respiratory: Negative.   Cardiovascular: Negative.   Gastrointestinal: Negative.   Genitourinary: Negative.   Neurological: Negative for dizziness and headaches.   Physical Exam   Blood pressure 135/86, pulse 90, temperature 98 F (36.7 C), temperature source Oral, resp. rate 17, height  (1.575 m), weight 260 lb (117.9 kg), last menstrual period 02/17/2016, SpO2 100 %, unknown if currently breastfeeding. Patient Vitals for the past 24 hrs:  BP Temp Temp src Pulse Resp SpO2 Height Weight  10/22/16 1801 135/86 - - 90 - - - -  10/22/16 1745 (!) 140/91 - - 80 - - - -  10/22/16 1731 135/72 - - 82 - - - -  10/22/16 1716 133/88 - - 87 - - - -  10/22/16 1701 (!) 138/94 -  - 84 - - - -  10/22/16 1643 (!) 149/87 - - 79 - - - -  10/22/16 1629 (!) 156/94 98 F (36.7 C) Oral 88 17 100 %  (1.575 m) 260 lb (117.9 kg)    Physical Exam  Nursing note and vitals reviewed. Constitutional: She is oriented to person, place, and time. She appears well-developed and well-nourished. No distress.  HENT:  Head: Normocephalic and atraumatic.  Eyes: Conjunctivae are normal. Right eye exhibits no discharge. Left eye exhibits no discharge. No scleral icterus.  Neck: Normal range of motion.  Cardiovascular: Normal rate, regular rhythm and normal heart sounds.   No murmur heard. Respiratory: Effort normal and breath sounds normal. No respiratory distress. She has no wheezes.  GI: Soft. Bowel sounds are normal. She exhibits no distension.  Musculoskeletal: She exhibits edema (BLE 3+ pitting edema).  Neurological: She is alert and oriented to person, place, and time. She has normal reflexes.  No clonus  Skin: Skin is warm and dry. She is not diaphoretic.  Psychiatric: She has a normal mood and affect. Her behavior is normal. Judgment and thought content normal.   Fetal Tracing:  Baseline: 130 Variability: moderate Accelerations: 15x15 Decelerations: none  Toco: none MAU Course  Procedures Results for orders placed or performed during the hospital encounter of 10/22/16 (from the past 24 hour(s))  Protein / creatinine ratio, urine     Status: None   Collection Time: 10/22/16  4:29 PM  Result Value Ref Range   Creatinine, Urine 77.00 mg/dL   Total Protein, Urine 7 mg/dL   Protein Creatinine Ratio 0.09 0.00 - 0.15 mg/mg[Cre]  Urinalysis, Routine w reflex microscopic     Status: Abnormal   Collection Time: 10/22/16  4:29 PM  Result Value Ref Range   Color, Urine YELLOW YELLOW   APPearance CLEAR CLEAR   Specific Gravity, Urine 1.011 1.005 - 1.030   pH 5.0 5.0 - 8.0   Glucose, UA NEGATIVE NEGATIVE mg/dL   Hgb urine dipstick NEGATIVE NEGATIVE   Bilirubin Urine  NEGATIVE NEGATIVE   Ketones, ur 20 (A) NEGATIVE mg/dL   Protein, ur NEGATIVE NEGATIVE mg/dL   Nitrite NEGATIVE NEGATIVE   Leukocytes, UA NEGATIVE NEGATIVE  Comprehensive metabolic panel     Status: Abnormal   Collection Time: 10/22/16  4:53 PM  Result Value Ref Range   Sodium 138 135 - 145 mmol/L   Potassium 3.8 3.5 - 5.1 mmol/L   Chloride 110 101 - 111 mmol/L   CO2 21 (L) 22 - 32 mmol/L   Glucose, Bld 85 65 - 99 mg/dL   BUN <5 (L) 6 - 20 mg/dL  Creatinine, Ser 0.44 0.44 - 1.00 mg/dL   Calcium 8.3 (L) 8.9 - 10.3 mg/dL   Total Protein 6.4 (L) 6.5 - 8.1 g/dL   Albumin 2.7 (L) 3.5 - 5.0 g/dL   AST 11 (L) 15 - 41 U/L   ALT 11 (L) 14 - 54 U/L   Alkaline Phosphatase 84 38 - 126 U/L   Total Bilirubin 0.3 0.3 - 1.2 mg/dL   GFR calc non Af Amer >60 >60 mL/min   GFR calc Af Amer >60 >60 mL/min   Anion gap 7 5 - 15  CBC     Status: Abnormal   Collection Time: 10/22/16  4:53 PM  Result Value Ref Range   WBC 7.4 4.0 - 10.5 K/uL   RBC 3.78 (L) 3.87 - 5.11 MIL/uL   Hemoglobin 9.2 (L) 12.0 - 15.0 g/dL   HCT 16.1 (L) 09.6 - 04.5 %   MCV 73.5 (L) 78.0 - 100.0 fL   MCH 24.3 (L) 26.0 - 34.0 pg   MCHC 33.1 30.0 - 36.0 g/dL   RDW 40.9 81.1 - 91.4 %   Platelets 206 150 - 400 K/uL    MDM Reactive NST Elevated BPs, none severe range. CBC, CMP, urine PCR  C/w Dr. Alysia Penna. Will start pt on labetalol 100 mg BID. Pt to f/u in clinic in 1 week for BP evaluation.   Assessment and Plan  A; 1. Maternal chronic hypertension in third trimester   2. [redacted] weeks gestation of pregnancy   3. Decreased fetal movements in third trimester, single or unspecified fetus   4. NST (non-stress test) reactive    P: Discharge home Rx labetalol 100 mg BID Msg to [facility] for f/u in 1 week Discussed reasons to return to MAU Fetal kick count form  [name] 10/22/2016, 4:54 PM

## 2016-10-22 NOTE — MAU Note (Signed)
[facility], sent here for eval. Denies headache or blurred vision.
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
## 2016-10-23 MED FILL — LABETALOL HCL 100 MG TABLET: 100 | 30 days supply | Qty: 60 | Fill #0

## 2016-10-26 ENCOUNTER — Telehealth: Payer: Self-pay | Admitting: General Practice

## 2016-10-26 NOTE — Telephone Encounter (Signed)
Called and left message on VM in regards to appointment on Thursday, 10/29/16 at 11:15am for BP check.  Asked patient to give our office a call if unable to keep this appointment.

## 2016-10-29 ENCOUNTER — Ambulatory Visit: Payer: Medicaid Other | Admitting: *Deleted

## 2016-10-29 VITALS — BP 143/72 | HR 87

## 2016-10-29 DIAGNOSIS — O0990 Supervision of high risk pregnancy, unspecified, unspecified trimester: Secondary | ICD-10-CM

## 2016-10-29 DIAGNOSIS — O10913 Unspecified pre-existing hypertension complicating pregnancy, third trimester: Secondary | ICD-10-CM

## 2016-10-29 MED FILL — TRUVADA 200-300 MG TABS: 200-300 | 30 days supply | Qty: 30 | Fill #4

## 2016-10-29 MED FILL — ONDANSETRON ODT 8 MG TABLET: 8 | 13 days supply | Qty: 40 | Fill #2

## 2016-10-29 MED FILL — ISENTRESS HD 600 MG TAB: 600 | 30 days supply | Qty: 60 | Fill #3

## 2016-10-29 NOTE — Progress Notes (Addendum)
Here for bp check after being started on labetolol 100 mg bid on 10/22/16 after elevated bp at ob visit.  Today's bp's 141/83, 143/72 , denies headaches, visual disturbances, or edema. C/o tenderness / or burn under right rib.  Discussed with Dr. Penne Lash and instructed patient to continue labetolol as ordered, keep ob fu as scheduled; come to mau if severe headaches, visual disturbances, increased edema, or if rib tenderness gets worse. She voices understanding.   Agree with nursing staff's documentation of this patient's clinic encounter.  Elsie Lincoln, MD

## 2016-10-30 ENCOUNTER — Inpatient Hospital Stay (HOSPITAL_COMMUNITY)
Admission: AD | Admit: 2016-10-30 | Discharge: 2016-10-31 | Disposition: A | Discharge status: Home / Self Care | Payer: Medicaid Other | Source: Ambulatory Visit | Attending: Obstetrics and Gynecology | Admitting: Obstetrics and Gynecology

## 2016-10-30 ENCOUNTER — Encounter (HOSPITAL_COMMUNITY): Payer: Self-pay | Admitting: *Deleted

## 2016-10-30 DIAGNOSIS — R51 Headache: Secondary | ICD-10-CM | POA: Insufficient documentation

## 2016-10-30 DIAGNOSIS — Z87891 Personal history of nicotine dependence: Secondary | ICD-10-CM | POA: Diagnosis not present

## 2016-10-30 DIAGNOSIS — O10913 Unspecified pre-existing hypertension complicating pregnancy, third trimester: Secondary | ICD-10-CM | POA: Diagnosis not present

## 2016-10-30 DIAGNOSIS — O2343 Unspecified infection of urinary tract in pregnancy, third trimester: Secondary | ICD-10-CM | POA: Insufficient documentation

## 2016-10-30 DIAGNOSIS — O10013 Pre-existing essential hypertension complicating pregnancy, third trimester: Secondary | ICD-10-CM | POA: Insufficient documentation

## 2016-10-30 DIAGNOSIS — Z7982 Long term (current) use of aspirin: Secondary | ICD-10-CM | POA: Diagnosis not present

## 2016-10-30 DIAGNOSIS — O26893 Other specified pregnancy related conditions, third trimester: Secondary | ICD-10-CM

## 2016-10-30 DIAGNOSIS — Z3A31 31 weeks gestation of pregnancy: Secondary | ICD-10-CM | POA: Insufficient documentation

## 2016-10-30 DIAGNOSIS — Z885 Allergy status to narcotic agent status: Secondary | ICD-10-CM | POA: Diagnosis not present

## 2016-10-30 DIAGNOSIS — R1013 Epigastric pain: Secondary | ICD-10-CM | POA: Diagnosis present

## 2016-10-30 LAB — COMPREHENSIVE METABOLIC PANEL
ALBUMIN: 2.7 g/dL — AB (ref 3.5–5.0)
ALT: 11 U/L — AB (ref 14–54)
AST: 11 U/L — AB (ref 15–41)
Alkaline Phosphatase: 92 U/L (ref 38–126)
Anion gap: 8 (ref 5–15)
BILIRUBIN TOTAL: 0.2 mg/dL — AB (ref 0.3–1.2)
BUN: 7 mg/dL (ref 6–20)
CHLORIDE: 104 mmol/L (ref 101–111)
CO2: 22 mmol/L (ref 22–32)
CREATININE: 0.52 mg/dL (ref 0.44–1.00)
Calcium: 8.8 mg/dL — ABNORMAL LOW (ref 8.9–10.3)
GFR calc Af Amer: >60 mL/min (ref >60)
GLUCOSE: 97 mg/dL (ref 65–99)
Potassium: 3.8 mmol/L (ref 3.5–5.1)
Sodium: 134 mmol/L — ABNORMAL LOW (ref 135–145)
Total Protein: 6.7 g/dL (ref 6.5–8.1)

## 2016-10-30 LAB — CBC
HEMATOCRIT: 29.3 % — AB (ref 36.0–46.0)
Hemoglobin: 9.8 g/dL — ABNORMAL LOW (ref 12.0–15.0)
MCH: 24.3 pg — ABNORMAL LOW (ref 26.0–34.0)
MCHC: 33.4 g/dL (ref 30.0–36.0)
MCV: 72.7 fL — AB (ref 78.0–100.0)
PLATELETS: 249 10*3/uL (ref 150–400)
RBC: 4.03 MIL/uL (ref 3.87–5.11)
RDW: 13.6 % (ref 11.5–15.5)
WBC: 6.6 10*3/uL (ref 4.0–10.5)

## 2016-10-30 LAB — URINALYSIS, ROUTINE W REFLEX MICROSCOPIC
Bilirubin Urine: NEGATIVE
Glucose, UA: NEGATIVE mg/dL
Hgb urine dipstick: NEGATIVE
Ketones, ur: NEGATIVE mg/dL
Nitrite: NEGATIVE
PROTEIN: NEGATIVE mg/dL
SPECIFIC GRAVITY, URINE: 1.009 (ref 1.005–1.030)
pH: 6 (ref 5.0–8.0)

## 2016-10-30 LAB — PROTEIN / CREATININE RATIO, URINE
CREATININE, URINE: 63 mg/dL
Protein Creatinine Ratio: 0.24 mg/mg{Cre} — ABNORMAL HIGH (ref 0.00–0.15)
Total Protein, Urine: 15 mg/dL

## 2016-10-30 MED ORDER — LABETALOL HCL 100 MG PO TABS
100.0000 mg | ORAL_TABLET | Freq: Once | ORAL | Status: AC
  Administered 2016-10-30: 100 mg via ORAL
  Filled 2016-10-30: qty 1

## 2016-10-30 MED ORDER — ONDANSETRON 4 MG PO TBDP
4.0000 mg | ORAL_TABLET | Freq: Once | ORAL | Status: AC
  Administered 2016-10-30: 4 mg via ORAL
  Filled 2016-10-30: qty 1

## 2016-10-30 MED ORDER — ACETAMINOPHEN 500 MG PO TABS
1000.0000 mg | ORAL_TABLET | Freq: Once | ORAL | Status: AC
Start: 2016-10-30 — End: 2016-10-30
  Administered 2016-10-30: 1000 mg via ORAL
  Filled 2016-10-30: qty 2

## 2016-10-30 NOTE — MAU Provider Note (Signed)
History     CSN: [id number]  Arrival date and time: 10/30/16 2120  Chief Complaint  Patient presents with  . Abdominal Pain  . Pelvic Pain   HPI Catherine Simmons is a 31 y.o. G5P4004 at [redacted]w[redacted]d who presents with headache, dysuria, & epigastric pain. History of chronic hypertension & recently started on labetalol 100 mg BID -- last dose was this morning.  Frontal headache x 2 hours. Rates pain 5/10. Has not treated. Endorses blurred vision & floaters associated with headache. Right epigastric pain for the last week. Pain is intermittent. States she was previously told it was due to heartburn but is not taking anything for symptoms. Nausea, no vomiting; has been an ongoing issue with this pregnancy & takes zofran for relief. For the last 3 days has had dysuria & increased urinary frequency. Denies fever/chills, flank pain, or hematuria.   OB History    Gravida Para Term Preterm AB Living   0   4   SAB TAB Ectopic Multiple Live Births         0 4      Past Medical History:  Diagnosis Date  . Anemia   . Cholecystitis 07/16/10  . Genital warts 2004  . Hemorrhoids   . HIV (human immunodeficiency virus infection) (HCC)   . Hx of pelvic inflammatory disease 08/31/2013   And hx of multiple STDs   . Hypertension   . Pregnancy induced hypertension   . Sickle cell trait [facility])     Past Surgical History:  Procedure Laterality Date  . CHOLECYSTECTOMY    . WISDOM TOOTH EXTRACTION      Family History  Problem Relation Age of Onset  . Cancer Mother   . Hypertension Mother   . Diabetes Mother   . Pancreatitis Mother   . Hypertension Father   . Diabetes Maternal Aunt     Social History  Substance Use Topics  . Smoking status: Former Smoker    Packs/day: 0.30    Types: Cigarettes    Start date: 03/11/2012  . Smokeless tobacco: Never Used  . Alcohol use No     Comment: occ    Allergies:  Allergies  Allergen Reactions  . Stadol [Butorphanol Tartrate] Itching   Tolerates percocet    Prescriptions Prior to Admission  Medication Sig Dispense Refill Last Dose  . acetaminophen (TYLENOL) 500 MG tablet Take 1,000 mg by mouth every 6 (six) hours as needed for mild pain, moderate pain or headache.    10/30/2016 at 0800  . aspirin 81 MG chewable tablet Chew 1 tablet (81 mg total) by mouth daily. 60 tablet 3 10/29/2016 at Unknown time  . docusate sodium (COLACE) 100 MG capsule Take 1 capsule (100 mg total) by mouth 2 (two) times daily as needed. (Patient taking differently: Take 100 mg by mouth 2 (two) times daily as needed for mild constipation. ) 60 capsule 3 10/29/2016 at Unknown time  . emtricitabine-tenofovir (TRUVADA) 200-300 MG tablet Take 1 tablet by mouth daily. 30 tablet 11 10/29/2016 at 0800  . labetalol (NORMODYNE) 100 MG tablet Take 1 tablet (100 mg total) by mouth 2 (two) times daily. 60 tablet 0 10/30/2016 at 0800  . ondansetron (ZOFRAN ODT) 8 MG disintegrating tablet Take 1 tablet (8 mg total) by mouth every 8 (eight) hours as needed for nausea or vomiting. 40 tablet 4 10/30/2016 at 0800  . Prenatal Vit-Fe Fumarate-FA (PRENATAL VITAMIN) 27-0.8 MG TABS Take 1 tablet by mouth daily. 30 tablet  6 10/29/2016 at Unknown time  . Raltegravir Potassium (ISENTRESS HD) 600 MG TABS Take 2 tablets by mouth daily. 60 tablet 11 10/29/2016 at Unknown time    Review of Systems  Constitutional: Negative.   Eyes: Positive for visual disturbance.  Gastrointestinal: Positive for abdominal pain and nausea. Negative for constipation, diarrhea and vomiting.  Genitourinary: Positive for dysuria, frequency and urgency. Negative for flank pain, hematuria, vaginal bleeding and vaginal discharge.  Neurological: Positive for headaches.   Physical Exam   Blood pressure 127/67, pulse 88, temperature 98.2 F (36.8 C), resp. rate 18, height  (1.575 m), weight 257 lb (116.6 kg), last menstrual period 02/17/2016, unknown if currently breastfeeding.  Patient Vitals for the past  24 hrs:  BP Temp Temp src Pulse Resp Height Weight  10/31/16 0025 - 98.4 F (36.9 C) Oral - 18 - -  10/31/16 0001 125/64 - - 80 - - -  10/30/16 2346 (!) 143/72 - - 79 - - -  10/30/16 2331 127/67 - - 88 - - -  10/30/16 2316 (!) 143/93 - - 92 - - -  10/30/16 2301 (!) 149/95 - - 89 - - -  10/30/16 2249 135/90 - - 88 - - -  10/30/16 2231 (!) 158/95 - - 88 18 - -  10/30/16 2218 (!) 153/99 - - 94 - - -  10/30/16 2153 (!) 137/95 98.2 F (36.8 C) - 99 20  (1.575 m) 257 lb (116.6 kg)   Physical Exam  Nursing note and vitals reviewed. Constitutional: She is oriented to person, place, and time. She appears well-developed and well-nourished. No distress.  HENT:  Head: Normocephalic and atraumatic.  Eyes: Conjunctivae are normal. Right eye exhibits no discharge. Left eye exhibits no discharge. No scleral icterus.  Neck: Normal range of motion.  Cardiovascular: Normal rate, regular rhythm and normal heart sounds.   No murmur heard. Respiratory: Effort normal and breath sounds normal. No respiratory distress. She has no wheezes.  GI: Soft. Bowel sounds are normal. There is no tenderness.  Musculoskeletal: She exhibits edema (BLE 2+ pitting edema).  Neurological: She is alert and oriented to person, place, and time. She has normal reflexes.  No clonus  Skin: Skin is warm and dry. She is not diaphoretic.  Psychiatric: She has a normal mood and affect. Her behavior is normal. Judgment and thought content normal.   Fetal Tracing:  Baseline: 135 Variability: moderate Accelerations: 15x15 Decelerations: none  Toco: none MAU Course  Procedures Results for orders placed or performed during the hospital encounter of 10/30/16 (from the past 24 hour(s))  Urinalysis, Routine w reflex microscopic     Status: Abnormal   Collection Time: 10/30/16 10:00 PM  Result Value Ref Range   Color, Urine YELLOW YELLOW   APPearance HAZY (A) CLEAR   Specific Gravity, Urine 1.009 1.005 - 1.030   pH 6.0 5.0  - 8.0   Glucose, UA NEGATIVE NEGATIVE mg/dL   Hgb urine dipstick NEGATIVE NEGATIVE   Bilirubin Urine NEGATIVE NEGATIVE   Ketones, ur NEGATIVE NEGATIVE mg/dL   Protein, ur NEGATIVE NEGATIVE mg/dL   Nitrite NEGATIVE NEGATIVE   Leukocytes, UA LARGE (A) NEGATIVE   RBC / HPF 0-5 0 - 5 RBC/hpf   WBC, UA TOO NUMEROUS TO COUNT 0 - 5 WBC/hpf   Bacteria, UA FEW (A) NONE SEEN   Squamous Epithelial / LPF 0-5 (A) NONE SEEN  Protein / creatinine ratio, urine     Status: Abnormal   Collection Time: 10/30/16  10:00 PM  Result Value Ref Range   Creatinine, Urine 63.00 mg/dL   Total Protein, Urine 15 mg/dL   Protein Creatinine Ratio 0.24 (H) 0.00 - 0.15 mg/mg[Cre]  CBC     Status: Abnormal   Collection Time: 10/30/16 10:48 PM  Result Value Ref Range   WBC 6.6 4.0 - 10.5 K/uL   RBC 4.03 3.87 - 5.11 MIL/uL   Hemoglobin 9.8 (L) 12.0 - 15.0 g/dL   HCT 40.9 (L) 81.1 - 91.4 %   MCV 72.7 (L) 78.0 - 100.0 fL   MCH 24.3 (L) 26.0 - 34.0 pg   MCHC 33.4 30.0 - 36.0 g/dL   RDW 78.2 95.6 - 21.3 %   Platelets 249 150 - 400 K/uL  Comprehensive metabolic panel     Status: Abnormal   Collection Time: 10/30/16 10:48 PM  Result Value Ref Range   Sodium 134 (L) 135 - 145 mmol/L   Potassium 3.8 3.5 - 5.1 mmol/L   Chloride 104 101 - 111 mmol/L   CO2 22 22 - 32 mmol/L   Glucose, Bld 97 65 - 99 mg/dL   BUN 7 6 - 20 mg/dL   Creatinine, Ser 0.86 0.44 - 1.00 mg/dL   Calcium 8.8 (L) 8.9 - 10.3 mg/dL   Total Protein 6.7 6.5 - 8.1 g/dL   Albumin 2.7 (L) 3.5 - 5.0 g/dL   AST 11 (L) 15 - 41 U/L   ALT 11 (L) 14 - 54 U/L   Alkaline Phosphatase 92 38 - 126 U/L   Total Bilirubin 0.2 (L) 0.3 - 1.2 mg/dL   GFR calc non Af Amer >60 >60 mL/min   GFR calc Af Amer >60 >60 mL/min   Anion gap 8 5 - 15    MDM Reactive NST Elevated BPs, none severe range. CBC, CMP, urine PCR pending.  Will give PM dose of labetalol 100 mg & tylenol to treat headache. Pt requests zofran prior to meds.  U/a impressive for UTI, will tx & send  for culture.  Headache improved with tylenol & BPs improved with labetalol Assessment and Plan  A: 1. Maternal chronic hypertension in third trimester   2. [redacted] weeks gestation of pregnancy   3. Headache in pregnancy, antepartum, third trimester   4. Urinary tract infection in mother during third trimester of pregnancy    P: Discharge home Rx macrobid Discussed reasons to return to MAU Keep follow up appointment with OB/PCP  Urine culture pending   [name] 10/30/2016, 10:48 PM

## 2016-10-30 NOTE — MAU Note (Addendum)
Pain in lower abd and pelvix for 3 days. Worse today. Worse with walking and moving. Peeing a lot and hurts with pressure to pee. Denies bleeding or LOF. Stabbing pain under R breast. Seeing some spots. Slight headache

## 2016-10-31 DIAGNOSIS — O10913 Unspecified pre-existing hypertension complicating pregnancy, third trimester: Secondary | ICD-10-CM

## 2016-10-31 MED ORDER — NITROFURANTOIN MONOHYD MACRO 100 MG PO CAPS: 100.0000 mg | ORAL_CAPSULE | Freq: Two times a day (BID) | ORAL | 0 refills | Status: DC

## 2016-10-31 NOTE — Discharge Instructions (Signed)
Pregnancy and Urinary Tract Infection What is a urinary tract infection? A urinary tract infection (UTI) is an infection of any part of the urinary tract. This includes the kidneys, the tubes that connect your kidneys to your bladder (ureters), the bladder, and the tube that carries urine out of your body (urethra). These organs make, store, and get rid of urine in the body. A UTI can be a bladder infection (cystitis) or a kidney infection (pyelonephritis). This infection may be caused by fungi, viruses, and bacteria. Bacteria are the most common cause of UTIs. You are more likely to develop a UTI during pregnancy because:  The physical and hormonal changes your body goes through can make it easier for bacteria to get into your urinary tract.  Your growing baby puts pressure on your uterus and can affect urine flow.  Does a UTI place my baby at risk? An untreated UTI during pregnancy could lead to a kidney infection, which can cause health problems that could affect your baby. Possible complications of an untreated UTI include:  Having your baby before 37 weeks of pregnancy (premature).  Having a baby with a low birth weight.  Developing high blood pressure during pregnancy (preeclampsia).  What are the symptoms of a UTI? Symptoms of a UTI include:  Fever.  Frequent urination or passing small amounts of urine frequently.  Needing to urinate urgently.  Pain or a burning sensation with urination.  Urine that smells bad or unusual.  Cloudy urine.  Pain in the lower abdomen or back.  Trouble urinating.  Blood in the urine.  Vomiting or being less hungry than normal.  Diarrhea or abdominal pain.  Vaginal discharge.  What are the treatment options for a UTI during pregnancy? Treatment for this condition may include:  Antibiotic medicines that are safe to take during pregnancy.  Other medicines to treat less common causes of UTI.  How can I prevent a UTI?  To prevent a  UTI:  Go to the bathroom as soon as you feel the need.  Always wipe from front to back.  Wash your genital area with soap and warm water daily.  Empty your bladder before and after sex.  Wear cotton underwear.  Limit your intake of high sugar foods or drinks, such as regular soda, juice, and sweets.  Drink 6-8 glasses of water daily.  Do not wear tight-fitting pants.  Do not douche or use deodorant sprays.  Do not drink alcohol, caffeine, or carbonated drinks. These can irritate the bladder.  Contact a health care provider if:  Your symptoms do not improve or get worse.  You have a fever after two days of treatment.  You have a rash.  You have abnormal vaginal discharge.  You have back or side pain.  You have chills.  You have nausea and vomiting. Get help right away if: Seek immediate medical care if you are pregnant and:  You feel contractions in your uterus.  You have lower belly pain.  You have a gush of fluid from your vagina.  You have blood in your urine.  You are vomiting and cannot keep down any medicines or water.  This information is not intended to replace advice given to you by your health care provider. Make sure you discuss any questions you have with your health care provider. Document Released: 05/23/2010 Document Revised: 01/10/2016 Document Reviewed: 12/17/2014 Elsevier Interactive Patient Education  2017 [employer]     Hypertension During Pregnancy Hypertension, commonly called high blood  pressure, is when the force of blood pumping through your arteries is too strong. Arteries are blood vessels that carry blood from the heart throughout the body. Hypertension during pregnancy can cause problems for you and your baby. Your baby may be born early (prematurely) or may not weigh as much as he or she should at birth. Very bad cases of hypertension during pregnancy can be life-threatening. Different types of hypertension can occur during  pregnancy. These include:  Chronic hypertension. This happens when: ? You have hypertension before pregnancy and it continues during pregnancy. ? You develop hypertension before you are [redacted] weeks pregnant, and it continues during pregnancy.  Gestational hypertension. This is hypertension that develops after the 20th week of pregnancy.  Preeclampsia, also called toxemia of pregnancy. This is a very serious type of hypertension that develops only during pregnancy. It affects the whole body, and it can be very dangerous for you and your baby.  Gestational hypertension and preeclampsia usually go away within 6 weeks after your baby is born. Women who have hypertension during pregnancy have a greater chance of developing hypertension later in life or during future pregnancies. What are the causes? The exact cause of hypertension is not known. What increases the risk? There are certain factors that make it more likely for you to develop hypertension during pregnancy. These include:  Having hypertension during a previous pregnancy or prior to pregnancy.  Being overweight.  Being older than age 18.  Being pregnant for the first time or being pregnant with more than one baby.  Becoming pregnant using fertilization methods such as IVF (in vitro fertilization).  Having diabetes, kidney problems, or systemic lupus erythematosus.  Having a family history of hypertension.  What are the signs or symptoms? Chronic hypertension and gestational hypertension rarely cause symptoms. Preeclampsia causes symptoms, which may include:  Increased protein in your urine. Your health care provider will check for this at every visit before you give birth (prenatal visit).  Severe headaches.  Sudden weight gain.  Swelling of the hands, face, legs, and feet.  Nausea and vomiting.  Vision problems, such as blurred or double vision.  Numbness in the face, arms, legs, and feet.  Dizziness.  Slurred  speech.  Sensitivity to bright lights.  Abdominal pain.  Convulsions.  How is this diagnosed? You may be diagnosed with hypertension during a routine prenatal exam. At each prenatal visit, you may:  Have a urine test to check for high amounts of protein in your urine.  Have your blood pressure checked. A blood pressure reading is recorded as two numbers, such as "120 over 80" (or 120/80). The first ("top") number is called the systolic pressure. It is a measure of the pressure in your arteries when your heart beats. The second ("bottom") number is called the diastolic pressure. It is a measure of the pressure in your arteries as your heart relaxes between beats. Blood pressure is measured in a unit called mm Hg. A normal blood pressure reading is: ? Systolic: below 120. ? Diastolic: below 80.  The type of hypertension that you are diagnosed with depends on your test results and when your symptoms developed.  Chronic hypertension is usually diagnosed before 20 weeks of pregnancy.  Gestational hypertension is usually diagnosed after 20 weeks of pregnancy.  Hypertension with high amounts of protein in the urine is diagnosed as preeclampsia.  Blood pressure measurements that stay above 160 systolic, or above 110 diastolic, are signs of severe preeclampsia.  How is  this treated? Treatment for hypertension during pregnancy varies depending on the type of hypertension you have and how serious it is.  If you take medicines called ACE inhibitors to treat chronic hypertension, you may need to switch medicines. ACE inhibitors should not be taken during pregnancy.  If you have gestational hypertension, you may need to take blood pressure medicine.  If you are at risk for preeclampsia, your health care provider may recommend that you take a low-dose aspirin every day to prevent high blood pressure during your pregnancy.  If you have severe preeclampsia, you may need to be hospitalized so you  and your baby can be monitored closely. You may also need to take medicine (magnesium sulfate) to prevent seizures and to lower blood pressure. This medicine may be given as an injection or through an IV tube.  In some cases, if your condition gets worse, you may need to deliver your baby early.  Follow these instructions at home: Eating and drinking  Drink enough fluid to keep your urine clear or pale yellow.  Eat a healthy diet that is low in salt (sodium). Do not add salt to your food. Check food labels to see how much sodium a food or beverage contains. Lifestyle  Do not use any products that contain nicotine or tobacco, such as cigarettes and e-cigarettes. If you need help quitting, ask your health care provider.  Do not use alcohol.  Avoid caffeine.  Avoid stress as much as possible. Rest and get plenty of sleep. General instructions  Take over-the-counter and prescription medicines only as told by your health care provider.  While lying down, lie on your left side. This keeps pressure off your baby.  While sitting or lying down, raise (elevate) your feet. Try putting some pillows under your lower legs.  Exercise regularly. Ask your health care provider what kinds of exercise are best for you.  Keep all prenatal and follow-up visits as told by your health care provider. This is important. Contact a health care provider if:  You have symptoms that your health care provider told you may require more treatment or monitoring, such as: ? Fever. ? Vomiting. ? Headache. Get help right away if:  You have severe abdominal pain or vomiting that does not get better with treatment.  You suddenly develop swelling in your hands, ankles, or face.  You gain 4 lbs (1.8 kg) or more in 1 week.  You develop vaginal bleeding, or you have blood in your urine.  You do not feel your baby moving as much as usual.  You have blurred or double vision.  You have muscle twitching or sudden  tightening (spasms).  You have shortness of breath.  Your lips or fingernails turn blue. This information is not intended to replace advice given to you by your health care provider. Make sure you discuss any questions you have with your health care provider. Document Released: 10/14/2010 Document Revised: 08/16/2015 Document Reviewed: 07/12/2015 Elsevier Interactive Patient Education  [employer].

## 2016-11-02 LAB — CULTURE, OB URINE: Culture: >=100000 — AB

## 2016-11-05 ENCOUNTER — Ambulatory Visit: Payer: Medicaid Other | Admitting: *Deleted

## 2016-11-05 ENCOUNTER — Ambulatory Visit (INDEPENDENT_AMBULATORY_CARE_PROVIDER_SITE_OTHER): Payer: Medicaid Other | Admitting: Obstetrics and Gynecology

## 2016-11-05 VITALS — BP 139/82 | HR 95 | Wt 255.2 lb

## 2016-11-05 DIAGNOSIS — O10913 Unspecified pre-existing hypertension complicating pregnancy, third trimester: Secondary | ICD-10-CM

## 2016-11-05 DIAGNOSIS — O0993 Supervision of high risk pregnancy, unspecified, third trimester: Secondary | ICD-10-CM

## 2016-11-05 DIAGNOSIS — O0990 Supervision of high risk pregnancy, unspecified, unspecified trimester: Secondary | ICD-10-CM

## 2016-11-05 DIAGNOSIS — Z3009 Encounter for other general counseling and advice on contraception: Secondary | ICD-10-CM | POA: Insufficient documentation
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
## 2016-11-05 NOTE — Progress Notes (Signed)
[place] for growth and BPP scheduled tomorrow.

## 2016-11-06 ENCOUNTER — Other Ambulatory Visit (HOSPITAL_COMMUNITY): Payer: Self-pay | Admitting: Maternal and Fetal Medicine

## 2016-11-06 ENCOUNTER — Encounter: Payer: Self-pay | Admitting: *Deleted

## 2016-11-06 ENCOUNTER — Ambulatory Visit (HOSPITAL_COMMUNITY)
Admission: RE | Admit: 2016-11-06 | Discharge: 2016-11-06 | Disposition: A | Discharge status: Home / Self Care | Payer: Medicaid Other | Source: Ambulatory Visit | Attending: Family Medicine | Admitting: Family Medicine

## 2016-11-06 ENCOUNTER — Encounter (HOSPITAL_COMMUNITY): Payer: Self-pay

## 2016-11-06 ENCOUNTER — Telehealth: Payer: Self-pay | Admitting: *Deleted

## 2016-11-06 DIAGNOSIS — Z3A32 32 weeks gestation of pregnancy: Secondary | ICD-10-CM

## 2016-11-06 DIAGNOSIS — O09893 Supervision of other high risk pregnancies, third trimester: Secondary | ICD-10-CM

## 2016-11-06 DIAGNOSIS — O99340 Other mental disorders complicating pregnancy, unspecified trimester: Secondary | ICD-10-CM

## 2016-11-06 DIAGNOSIS — O99213 Obesity complicating pregnancy, third trimester: Secondary | ICD-10-CM

## 2016-11-06 DIAGNOSIS — O10013 Pre-existing essential hypertension complicating pregnancy, third trimester: Secondary | ICD-10-CM | POA: Diagnosis present

## 2016-11-06 DIAGNOSIS — F32A Depression, unspecified: Secondary | ICD-10-CM

## 2016-11-06 DIAGNOSIS — O98713 Human immunodeficiency virus [HIV] disease complicating pregnancy, third trimester: Secondary | ICD-10-CM | POA: Insufficient documentation

## 2016-11-06 DIAGNOSIS — F329 Major depressive disorder, single episode, unspecified: Secondary | ICD-10-CM

## 2016-11-06 DIAGNOSIS — O98712 Human immunodeficiency virus [HIV] disease complicating pregnancy, second trimester: Secondary | ICD-10-CM

## 2016-11-06 IMAGING — US US MFM OB FOLLOW-UP
1 series · 13 of 28 positions shown · non-contrast
Comparison: none

[Series 1: us mfm ob follow-up · 31 acquisitions, 13 frames shown]
[im 2/31]
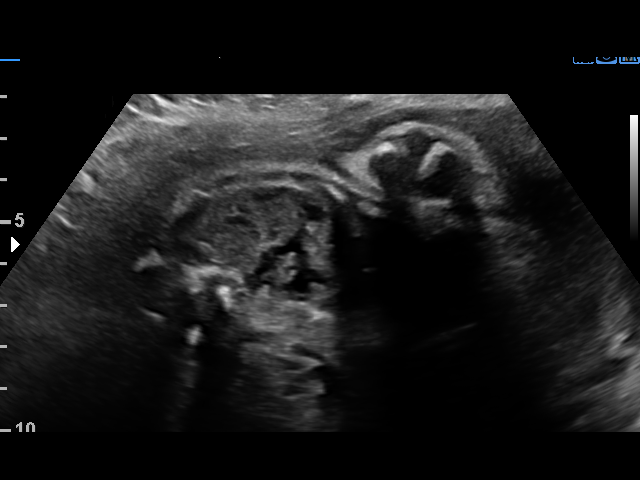
[im 4/31]
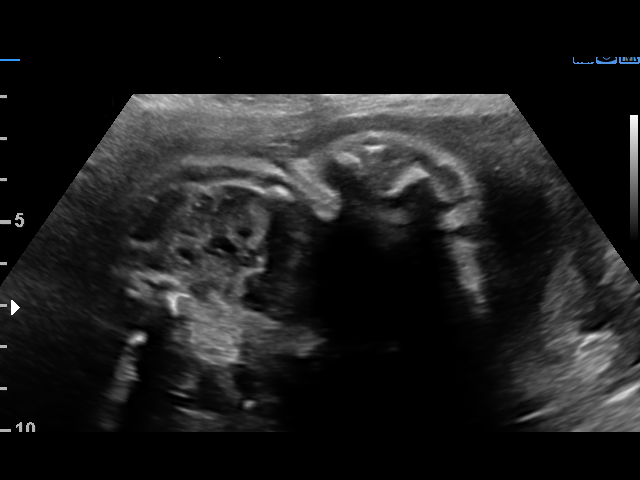
[im 6/31]
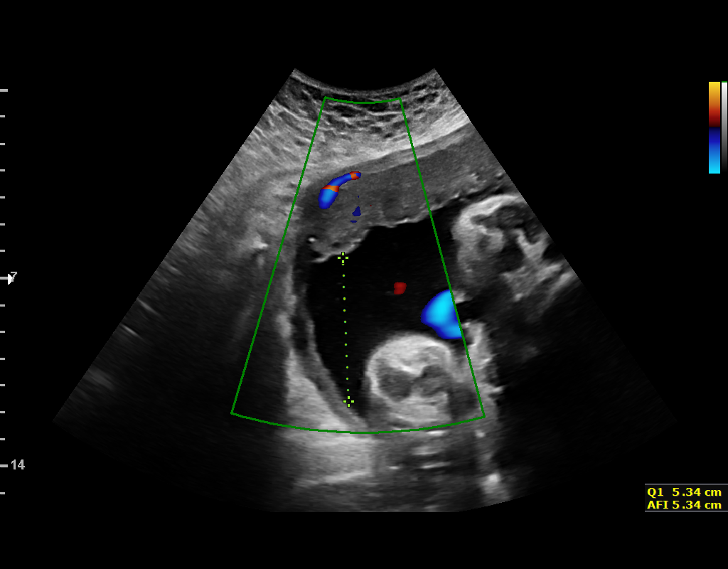
[im 8/31]
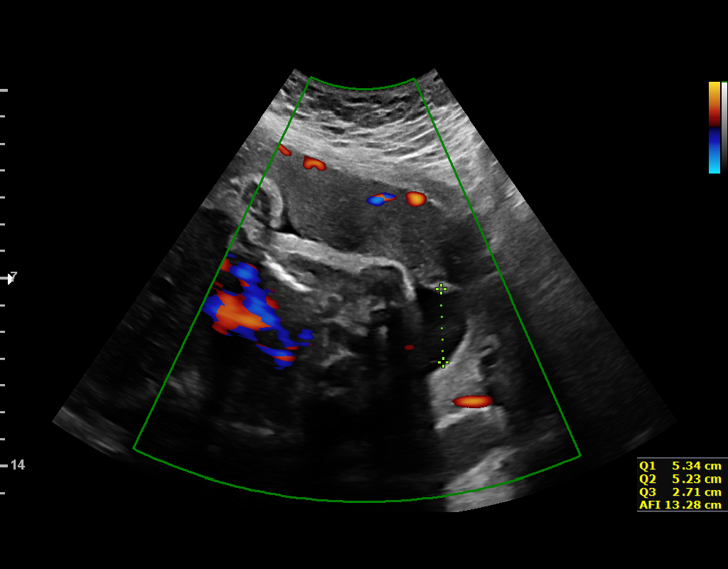
[im 11/31]
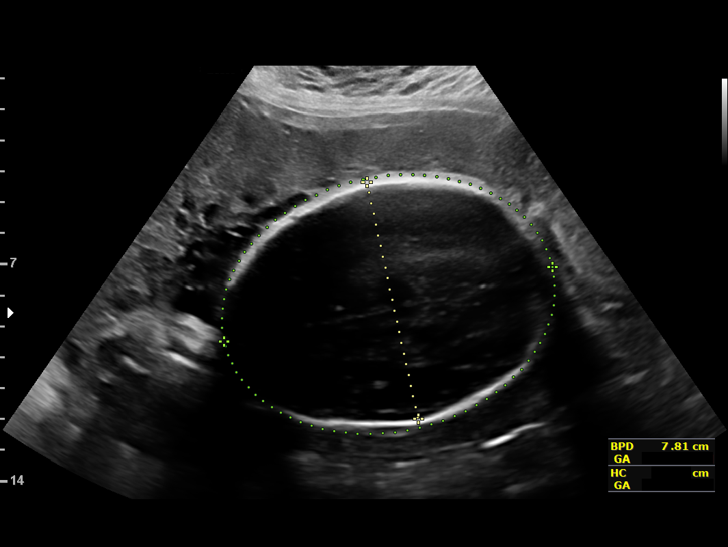
[im 13/31]
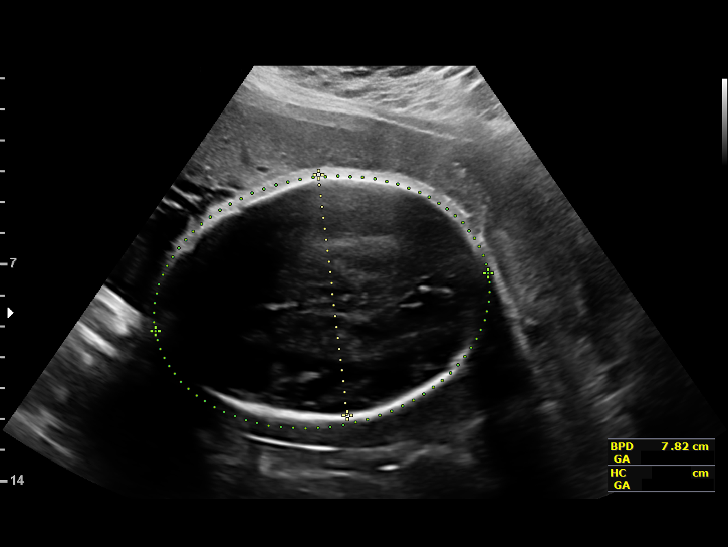
[im 16/31]
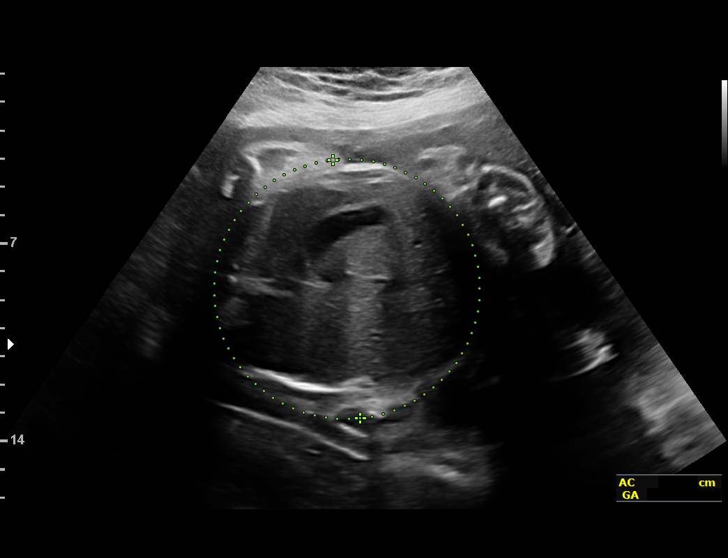
[im 18/31]
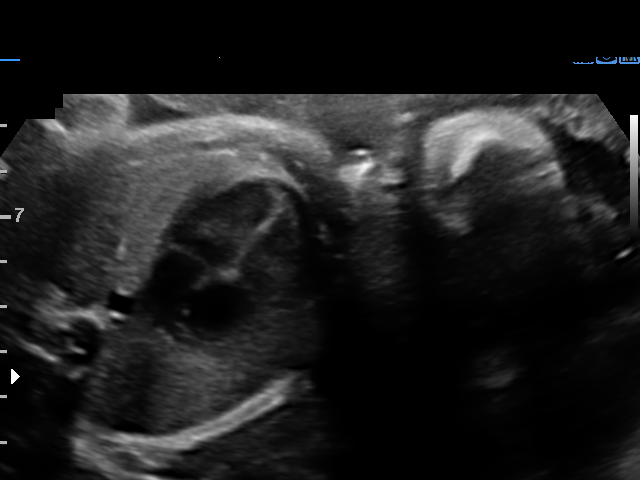
[im 21/31]
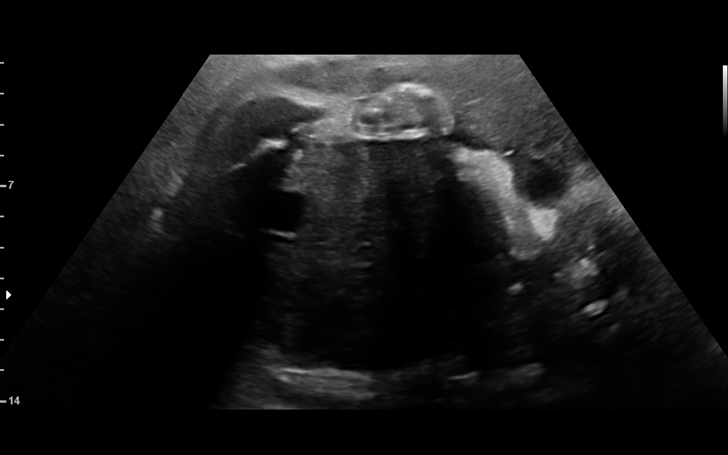
[im 23/31]
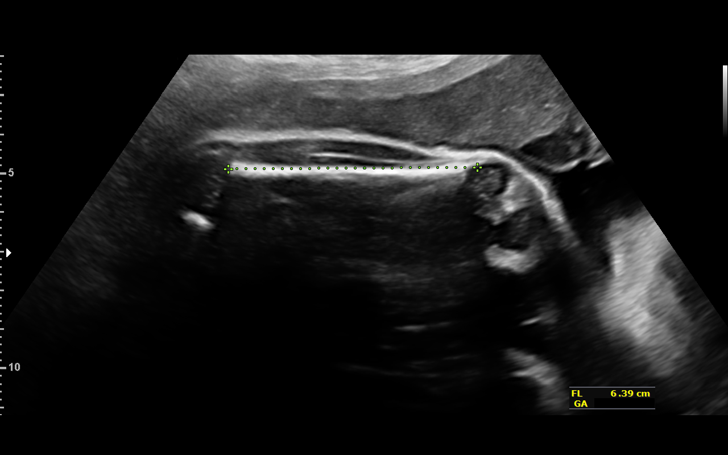
[im 25/31]
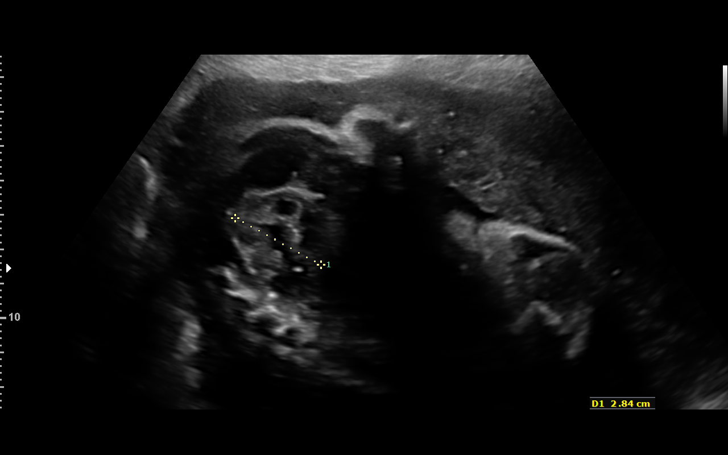
[im 27/31]
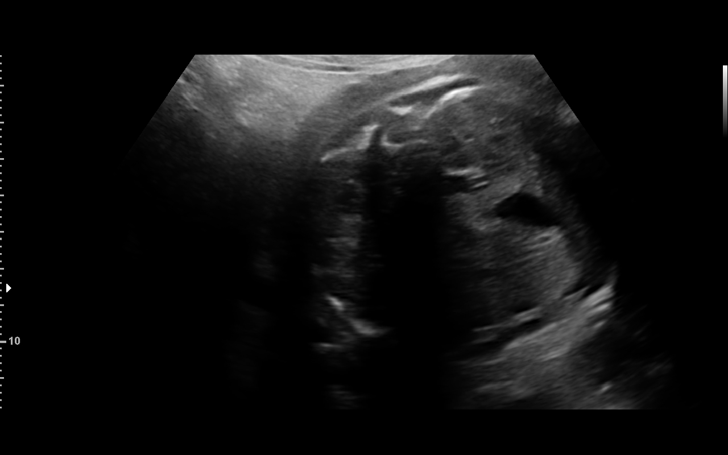
[im 29/31]
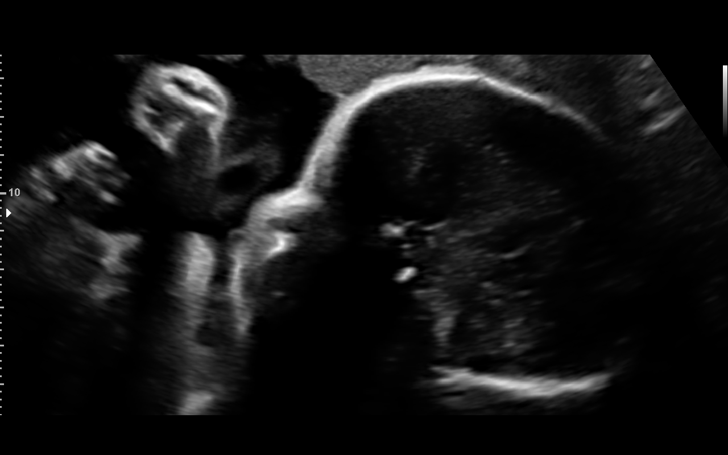

[13 of 28 positions shown; findings below may reference images not displayed]

Performed By:     [facility]          Secondary Phy.:   [REDACTED]
[facility]yn Clinic

1  [facility]           [PHONE_NUMBER]      [PHONE_NUMBER]     [PHONE_NUMBER]
2  [facility]           [PHONE_NUMBER]      [PHONE_NUMBER]     [PHONE_NUMBER]
Indications

32 weeks gestation of pregnancy
Hypertension - Chronic/Pre-existing            [5G]
Poor obstetric history: Previous               [5G]
preeclampsia / eclampsia/gestational HTN
Other mental disorder complicating             [5G]
pregnancy, unspecified trimester
HIV affecting pregnancy, third trimester       [5G]
Obesity complicating pregnancy, third          [5G]
trimester
OB History

Blood Type:            Height:  5'2"   Weight (lb):  238      BMI:
Gravidity:    5         Term:   4
Living:       4
Fetal Evaluation

Num Of Fetuses:     1
Fetal Heart         127
Rate(bpm):
Cardiac Activity:   Observed
Presentation:       Cephalic
Placenta:           Anterior, above cervical os
P. Cord Insertion:  Previously Visualized

Amniotic Fluid
AFI FV:      Subjectively within normal limits

AFI Sum(cm)     %Tile       Largest Pocket(cm)
15.46           55

RUQ(cm)       RLQ(cm)       LUQ(cm)        LLQ(cm)
5.34

Comment:    [DATE] BPP in 7 mins.
Biophysical Evaluation

Amniotic F.V:   Within normal limits       F. Tone:        Observed
F. Movement:    Observed                   Score:          [DATE]
F. Breathing:   Observed
Biometry

BPD:      78.8  mm     G. Age:  31w 4d         20  %    CI:        69.51   %   70 - 86
FL/HC:      20.7   %   19.1 -
HC:      301.7  mm     G. Age:  33w 3d         41  %    HC/AC:      1.05       0.96 -
AC:      287.4  mm     G. Age:  32w 5d         59  %    FL/BPD:     79.4   %   71 - 87
FL:       62.6  mm     G. Age:  32w 3d         37  %    FL/AC:      21.8   %   20 - 24

Est. FW:    [5G]  gm      4 lb 7 oz     61  %
Gestational Age

LMP:           37w 4d       Date:   [DATE]                 EDD:   [DATE]
U/S Today:     32w 4d                                        EDD:   [DATE]
Best:          32w 3d    Det. By:   U/S  ([DATE])          EDD:   [DATE]
Anatomy

Cranium:               Appears normal         Aortic Arch:            Previously seen
Cavum:                 Previously seen        Ductal Arch:            Previously seen
Ventricles:            Previously seen        Diaphragm:              Previously seen
Choroid Plexus:        Previously seen        Stomach:                Appears normal, left
sided
Cerebellum:            Previously seen        Abdomen:                Previously seen
Posterior Fossa:       Previously seen        Abdominal Wall:         Previously seen
Nuchal Fold:           Previously seen        Cord Vessels:           Previously seen
Face:                  Orbits and profile     Kidneys:                Appear normal
previously seen
Lips:                  Previously seen        Bladder:                Appears normal
Thoracic:              Appears normal         Spine:                  Previously seen
Heart:                 Appears normal         Upper Extremities:      Previously seen
(4CH, axis, and situs
RVOT:                  Previously seen        Lower Extremities:      Previously seen
LVOT:                  Appears normal

Other:  Fetus appears to be a female. Heels and 5th digit previously
visualized. Nasal bone previously visualized. Technically difficult due
to maternal habitus.
Cervix Uterus Adnexa

Cervix
Not visualized (advanced GA >[5G])
Impression

Singleton intrauterine pregnancy at 32 weeks 3 days
gestation with fetal cardiac activity
Cephalic presentation
Anterior placenta without evidence of previa
Normal appearing fetal growth and amniotic fluid volume
BPP [DATE] with an AFI > 15 cm
Recommendations

Weekly antenatal testing and serial ultrasounds for growth

## 2016-11-06 NOTE — Telephone Encounter (Signed)
Called pt and discussed results of yesterday's depression screen questionnaire. I stated that it looks like she may be feeling depressed or anxious based on her responses.  Pt stated that maybe she feels a little anxious and thinks it may be due to feeling tired. I offered opportunity for appt w/Behavioral Health Clinician @ her next visit and she accepted. I advised pt to be here @ 1000 on 10/4 and she voiced understanding.

## 2016-11-09 ENCOUNTER — Other Ambulatory Visit (HOSPITAL_COMMUNITY): Payer: Self-pay | Admitting: *Deleted

## 2016-11-12 ENCOUNTER — Ambulatory Visit (INDEPENDENT_AMBULATORY_CARE_PROVIDER_SITE_OTHER): Payer: Medicaid Other | Admitting: Obstetrics and Gynecology

## 2016-11-12 ENCOUNTER — Ambulatory Visit: Payer: Self-pay

## 2016-11-12 ENCOUNTER — Ambulatory Visit (INDEPENDENT_AMBULATORY_CARE_PROVIDER_SITE_OTHER): Payer: Medicaid Other | Admitting: *Deleted

## 2016-11-12 ENCOUNTER — Institutional Professional Consult (permissible substitution): Payer: Self-pay

## 2016-11-12 VITALS — BP 134/89 | HR 90 | Wt 261.0 lb

## 2016-11-12 DIAGNOSIS — O0993 Supervision of high risk pregnancy, unspecified, third trimester: Secondary | ICD-10-CM

## 2016-11-12 DIAGNOSIS — O10913 Unspecified pre-existing hypertension complicating pregnancy, third trimester: Secondary | ICD-10-CM

## 2016-11-12 DIAGNOSIS — Z3009 Encounter for other general counseling and advice on contraception: Secondary | ICD-10-CM

## 2016-11-12 DIAGNOSIS — I10 Essential (primary) hypertension: Secondary | ICD-10-CM

## 2016-11-12 DIAGNOSIS — O98719 Human immunodeficiency virus [HIV] disease complicating pregnancy, unspecified trimester: Secondary | ICD-10-CM

## 2016-11-12 DIAGNOSIS — O0990 Supervision of high risk pregnancy, unspecified, unspecified trimester: Secondary | ICD-10-CM

## 2016-11-12 LAB — POCT URINALYSIS DIP (DEVICE)
Bilirubin Urine: NEGATIVE
Glucose, UA: NEGATIVE mg/dL
HGB URINE DIPSTICK: NEGATIVE
KETONES UR: NEGATIVE mg/dL
Nitrite: NEGATIVE
PROTEIN: NEGATIVE mg/dL
SPECIFIC GRAVITY, URINE: 1.01 (ref 1.005–1.030)
UROBILINOGEN UA: 0.2 mg/dL (ref 0.0–1.0)
pH: 5.5 (ref 5.0–8.0)

## 2016-11-12 IMAGING — US US FETAL BPP W/ NON-STRESS
1 series · 10 of 10 positions shown · non-contrast
Comparison: none

[Series 1: us fetal bpp w/nonstress · 10 acquisitions, 10 frames shown]
[im 1/10]
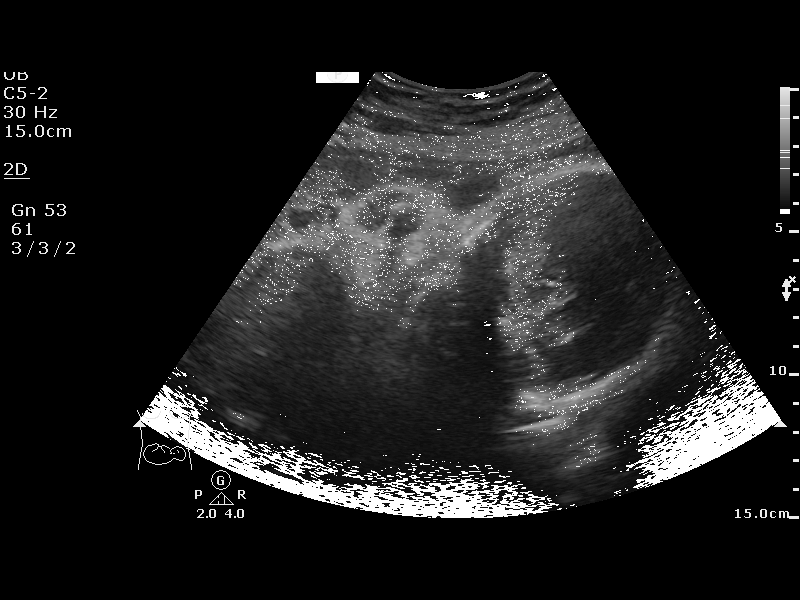
[im 2/10]
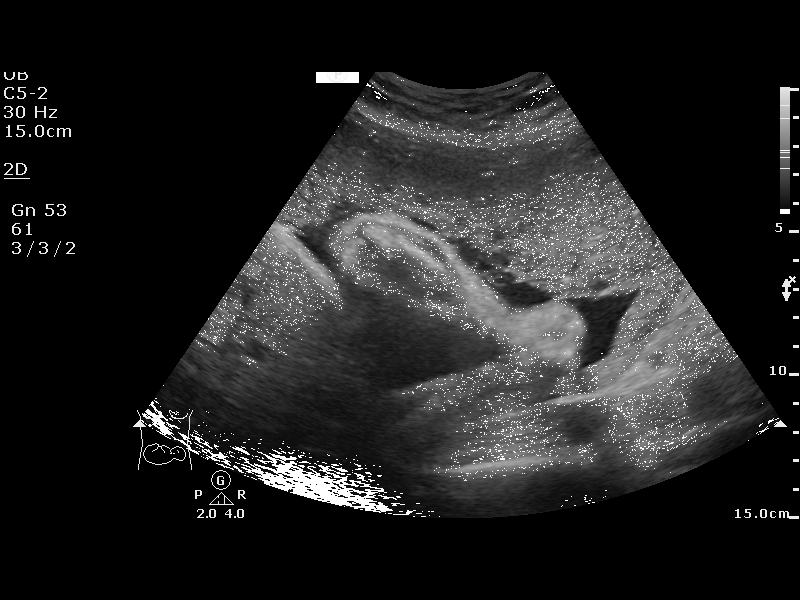
[im 3/10]
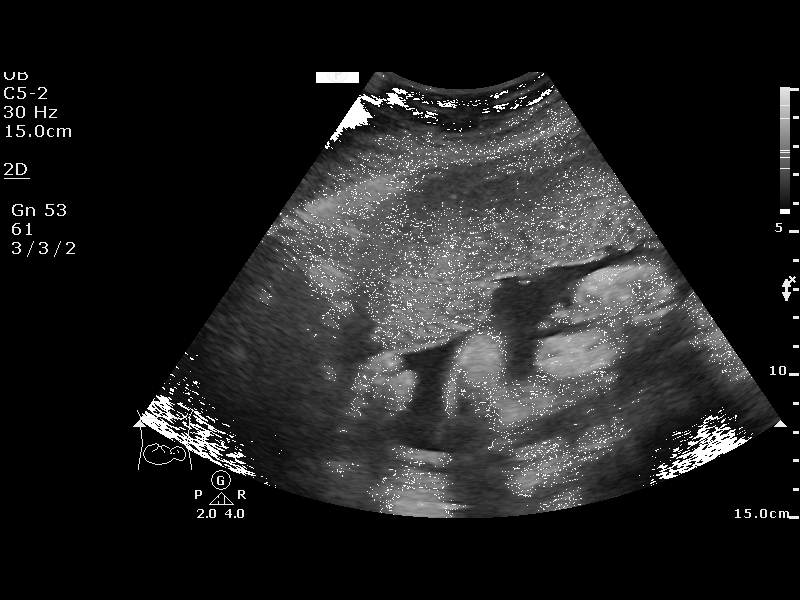
[im 4/10]
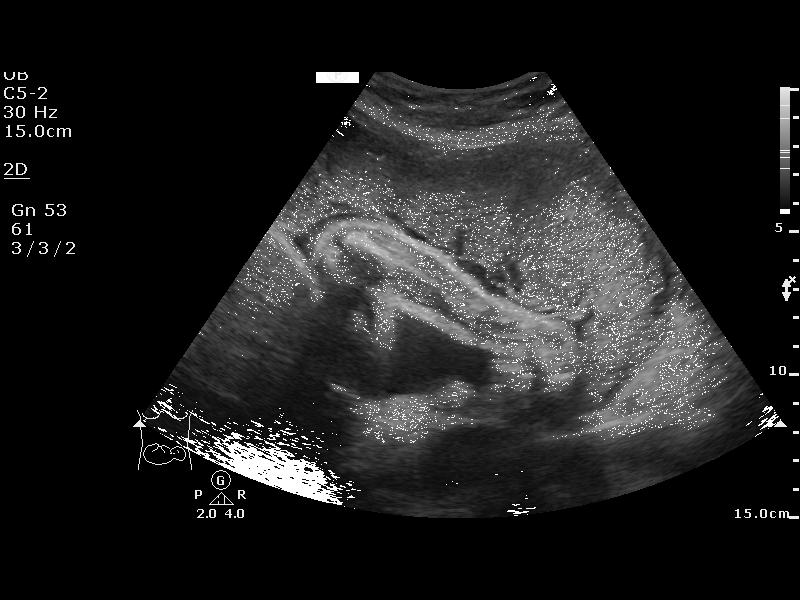
[im 5/10]
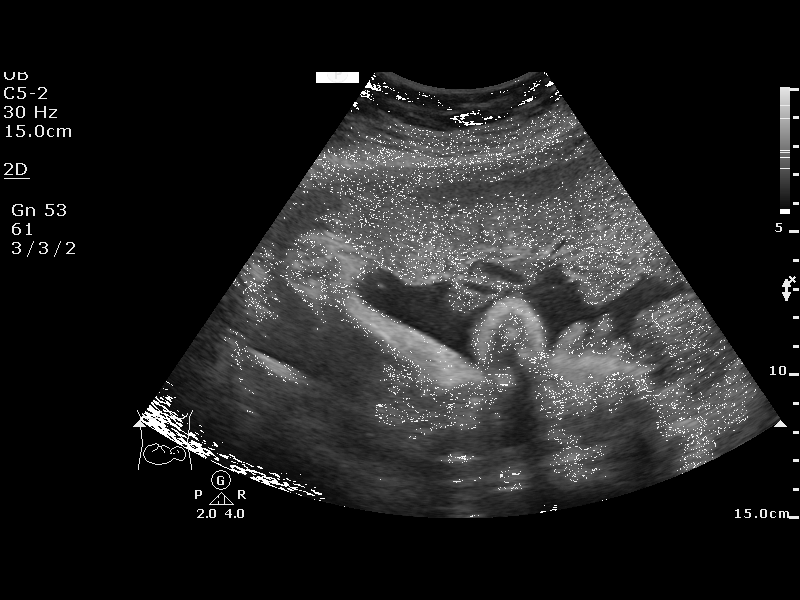
[im 6/10]
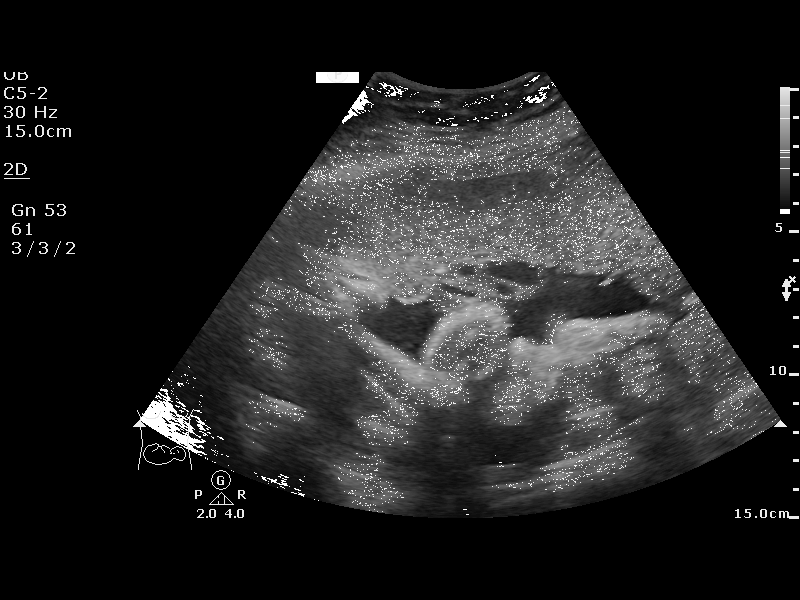
[im 7/10]
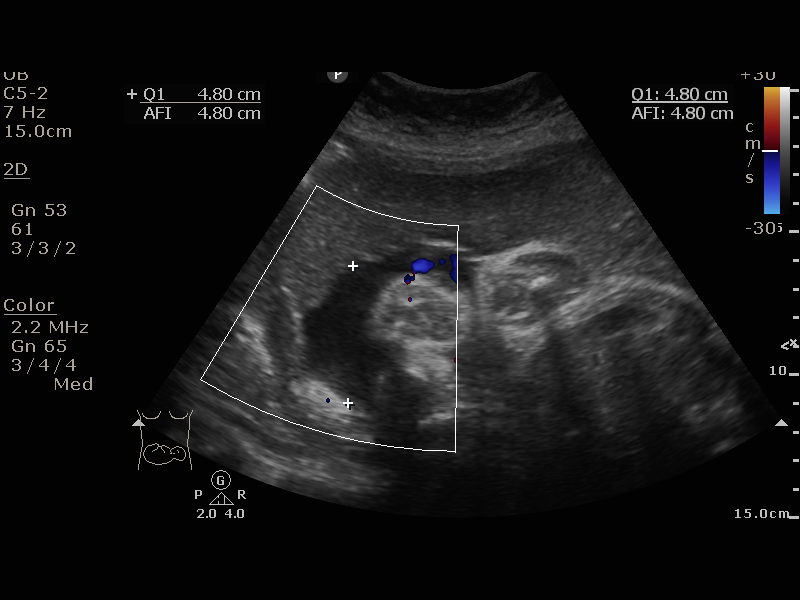
[im 8/10]
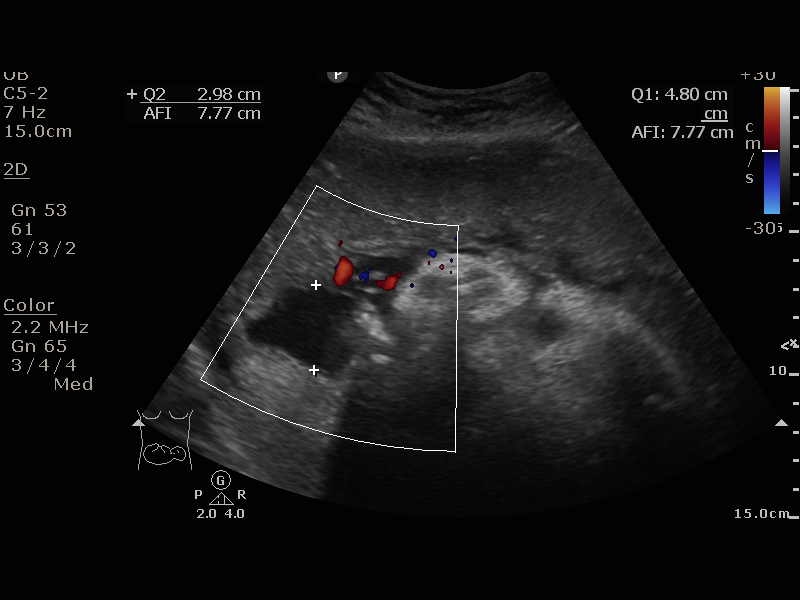
[im 9/10]
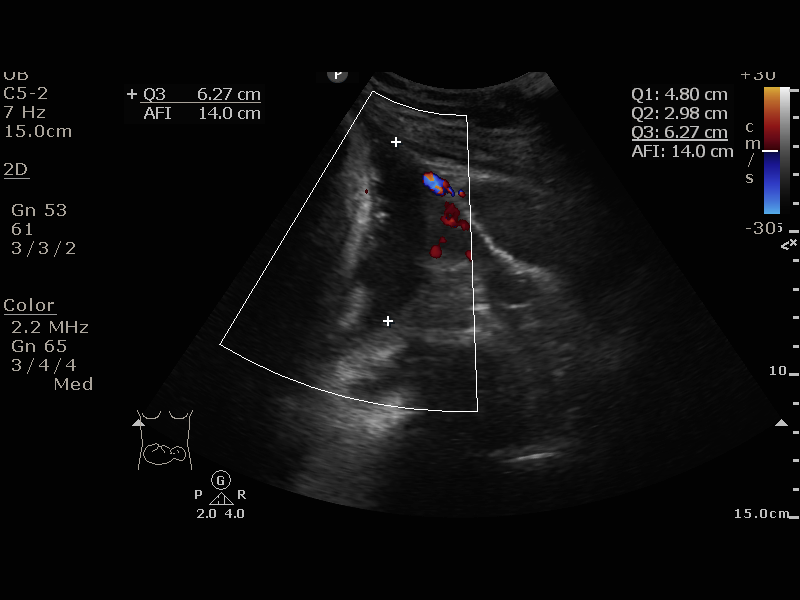
[im 10/10]
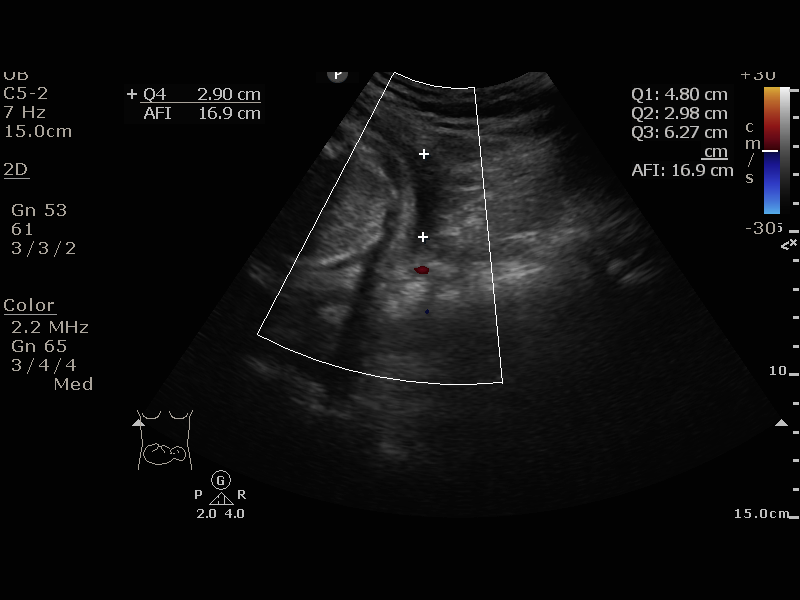

[10 of 10 positions shown; findings below may reference images not displayed]

[facility]                                  [facility]ACTED[facility]

1  US FETAL BPP W/NONSTRESS                    76818.4

1  [facility]            [PHONE_NUMBER]      [PHONE_NUMBER]     [PHONE_NUMBER]
Service(s) Provided

Indications

33 weeks gestation of pregnancy
Unspecified pre-existing hypertension          [HS]
complicating pregnancy, third trimester
OB History

Blood Type:            Height:  5'2"   Weight (lb):  238       BMI:
Gravidity:    5         Term:   4
Living:       4
Fetal Evaluation

Num Of Fetuses:     1
Preg. Location:     Intrauterine
Cardiac Activity:   Observed
Presentation:       Transverse, head to maternal left
Amniotic Fluid
AFI FV:      Subjectively within normal limits

AFI Sum(cm)     %Tile       Largest Pocket(cm)
16.95           62

RUQ(cm)       RLQ(cm)       LUQ(cm)        LLQ(cm)
4.8
Biophysical Evaluation

Amniotic F.V:   Pocket => 2 cm two         F. Tone:        Observed
planes
F. Movement:    Observed                   N.S.T:          Reactive
F. Breathing:   Not Observed               Score:          [DATE]
Gestational Age

LMP:           38w 3d        Date:  [DATE]                 EDD:   [DATE]
Best:          33w 2d     Det. By:  U/S  ([DATE])          EDD:   [DATE]
Impression

BPP [DATE] (-2 Breathing)
Transverse lie
Recommendations

Continue antenatal testing and serial ultrasound for fetal
growth.

## 2016-11-12 MED ORDER — GLYCERIN (ADULT) 2 G RE SUPP: 1.0000 | RECTAL | 1 refills | Status: DC | PRN

## 2016-11-12 NOTE — Progress Notes (Signed)
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
## 2016-11-13 ENCOUNTER — Encounter (HOSPITAL_COMMUNITY): Payer: Self-pay

## 2016-11-13 ENCOUNTER — Ambulatory Visit (HOSPITAL_COMMUNITY)
Admission: RE | Admit: 2016-11-13 | Discharge: 2016-11-13 | Disposition: A | Discharge status: Home / Self Care | Payer: Medicaid Other | Source: Ambulatory Visit | Attending: Family Medicine | Admitting: Family Medicine

## 2016-11-19 ENCOUNTER — Ambulatory Visit (INDEPENDENT_AMBULATORY_CARE_PROVIDER_SITE_OTHER): Payer: Medicaid Other | Admitting: Obstetrics and Gynecology

## 2016-11-19 ENCOUNTER — Ambulatory Visit: Payer: Self-pay

## 2016-11-19 ENCOUNTER — Ambulatory Visit (INDEPENDENT_AMBULATORY_CARE_PROVIDER_SITE_OTHER): Payer: Medicaid Other | Admitting: *Deleted

## 2016-11-19 VITALS — BP 134/82 | HR 95 | Wt 257.8 lb

## 2016-11-19 DIAGNOSIS — O10913 Unspecified pre-existing hypertension complicating pregnancy, third trimester: Secondary | ICD-10-CM | POA: Diagnosis present

## 2016-11-19 DIAGNOSIS — O98719 Human immunodeficiency virus [HIV] disease complicating pregnancy, unspecified trimester: Secondary | ICD-10-CM

## 2016-11-19 DIAGNOSIS — Z3009 Encounter for other general counseling and advice on contraception: Secondary | ICD-10-CM

## 2016-11-19 DIAGNOSIS — F32A Depression, unspecified: Secondary | ICD-10-CM

## 2016-11-19 DIAGNOSIS — O0990 Supervision of high risk pregnancy, unspecified, unspecified trimester: Secondary | ICD-10-CM

## 2016-11-19 DIAGNOSIS — O99343 Other mental disorders complicating pregnancy, third trimester: Secondary | ICD-10-CM

## 2016-11-19 DIAGNOSIS — Z8619 Personal history of other infectious and parasitic diseases: Secondary | ICD-10-CM

## 2016-11-19 DIAGNOSIS — O98713 Human immunodeficiency virus [HIV] disease complicating pregnancy, third trimester: Secondary | ICD-10-CM

## 2016-11-19 DIAGNOSIS — F329 Major depressive disorder, single episode, unspecified: Secondary | ICD-10-CM

## 2016-11-19 DIAGNOSIS — D573 Sickle-cell trait: Secondary | ICD-10-CM

## 2016-11-19 DIAGNOSIS — O0993 Supervision of high risk pregnancy, unspecified, third trimester: Secondary | ICD-10-CM

## 2016-11-19 DIAGNOSIS — O99340 Other mental disorders complicating pregnancy, unspecified trimester: Secondary | ICD-10-CM

## 2016-11-19 DIAGNOSIS — I10 Essential (primary) hypertension: Secondary | ICD-10-CM

## 2016-11-19 LAB — POCT URINALYSIS DIP (DEVICE)
Bilirubin Urine: NEGATIVE
GLUCOSE, UA: NEGATIVE mg/dL
HGB URINE DIPSTICK: NEGATIVE
KETONES UR: NEGATIVE mg/dL
Nitrite: NEGATIVE
PROTEIN: NEGATIVE mg/dL
SPECIFIC GRAVITY, URINE: 1.01 (ref 1.005–1.030)
Urobilinogen, UA: 0.2 mg/dL (ref 0.0–1.0)
pH: 6 (ref 5.0–8.0)

## 2016-11-19 IMAGING — US US FETAL BPP W/ NON-STRESS
1 series · 13 of 14 positions shown · non-contrast
Comparison: none

[Series 1: us fetal bpp w/nonstress · 14 acquisitions, 13 frames shown]
[im 1/14]
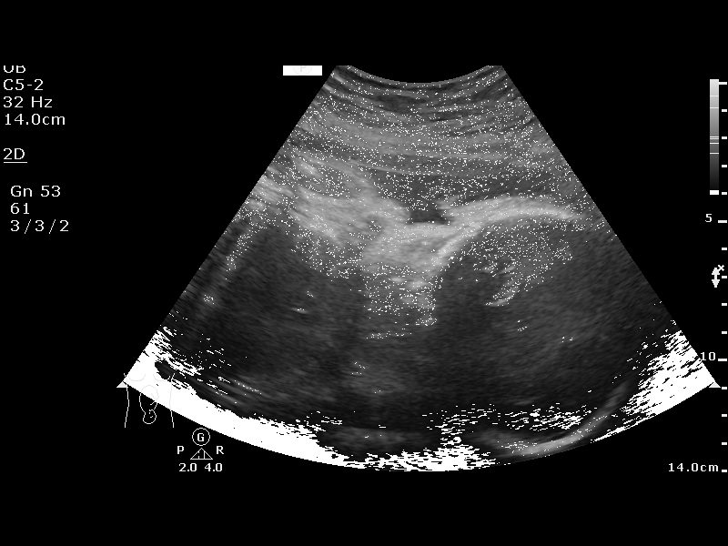
[im 2/14]
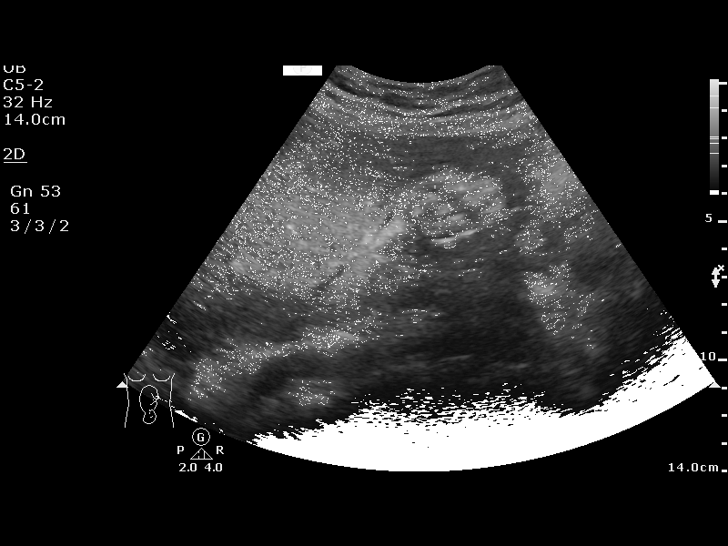
[im 3/14]
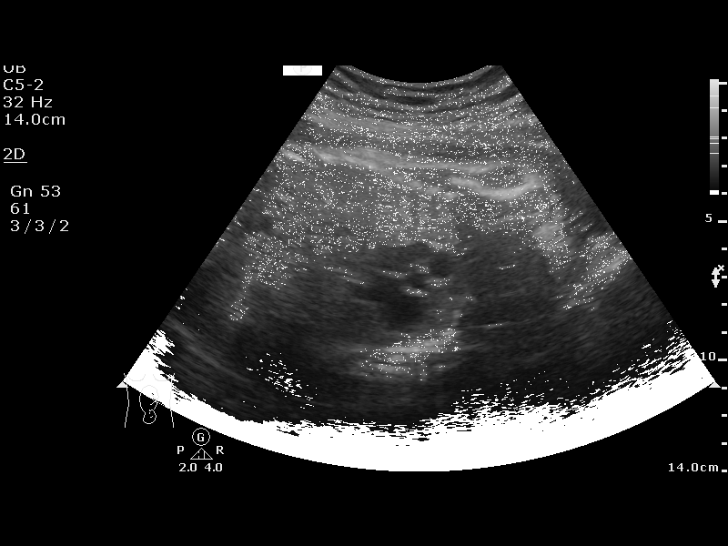
[im 4/14]
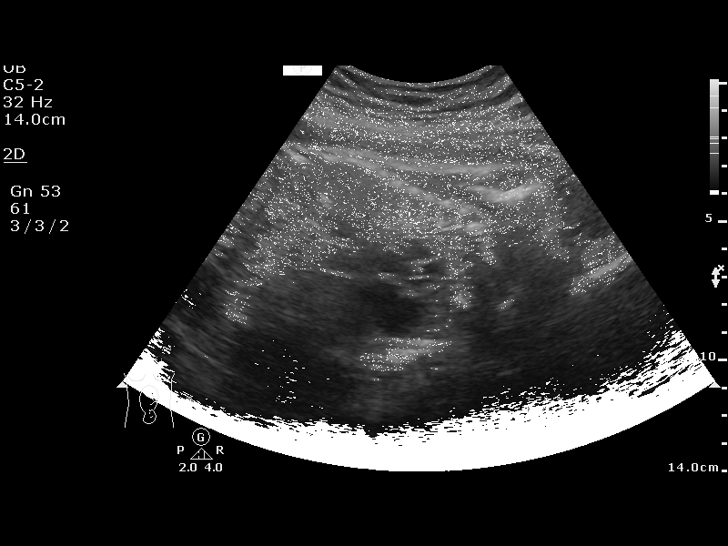
[im 5/14]
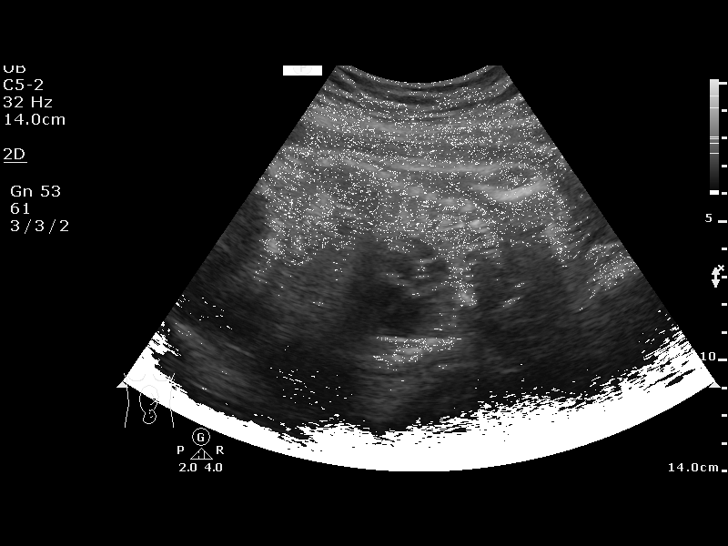
[im 6/14]
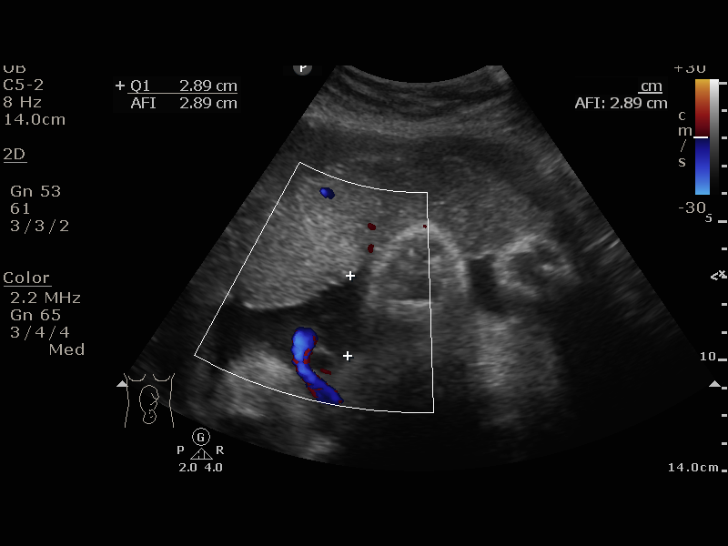
[im 8/14]
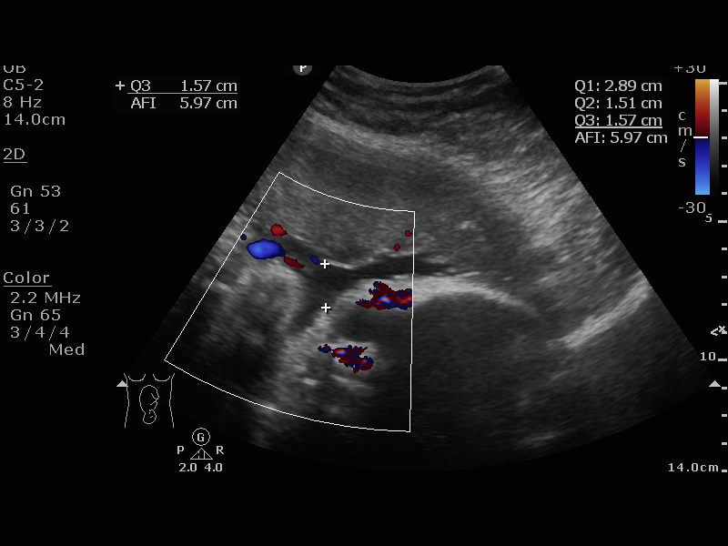
[im 9/14]
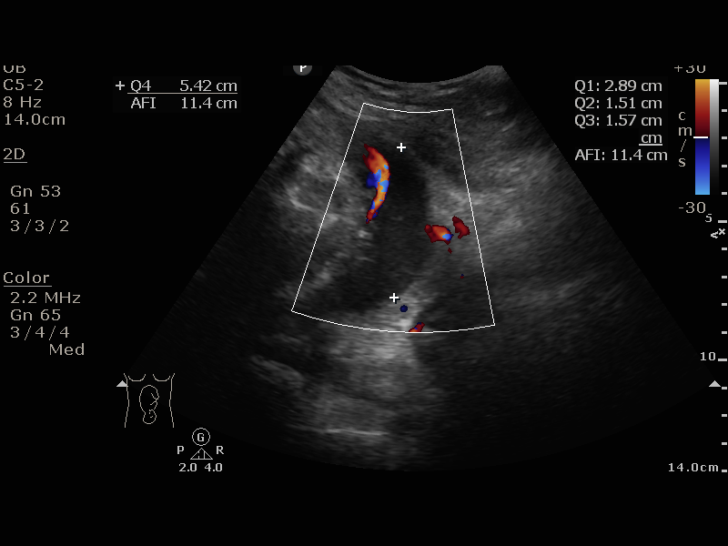
[im 10/14]
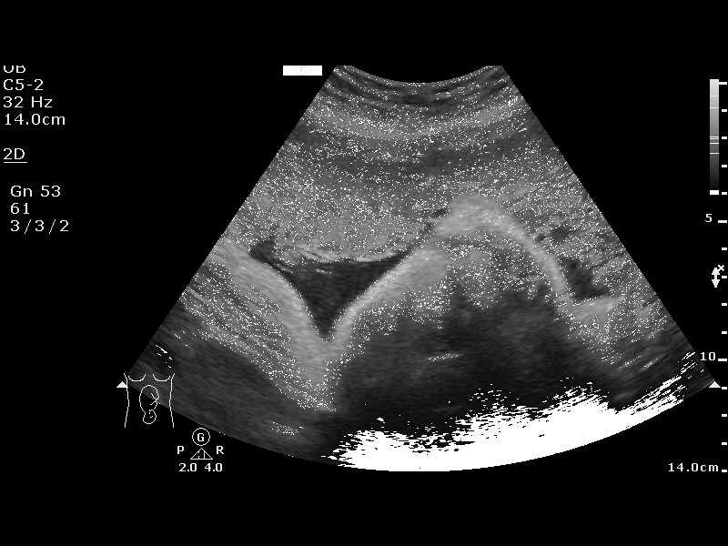
[im 11/14]
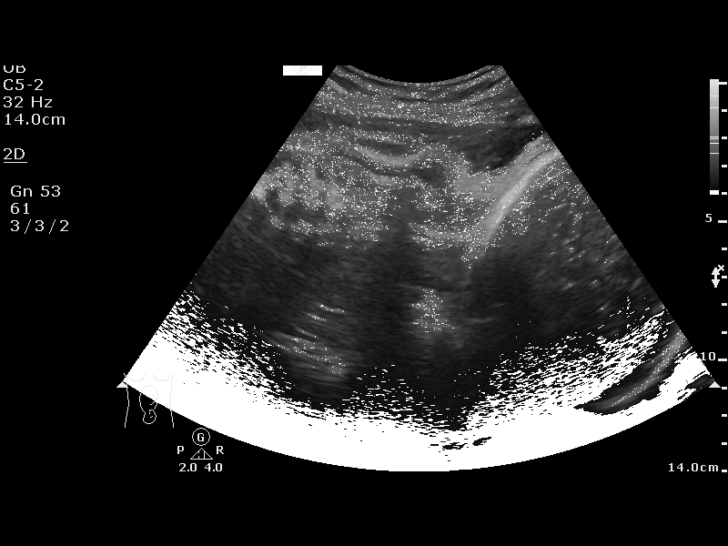
[im 12/14]
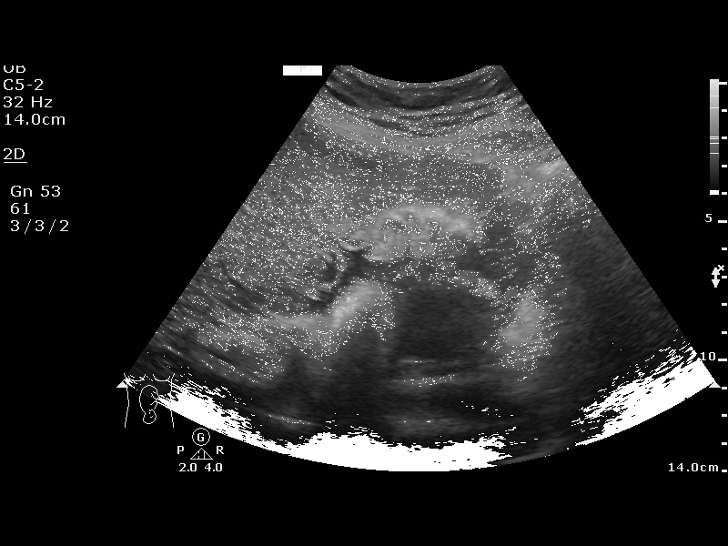
[im 13/14]
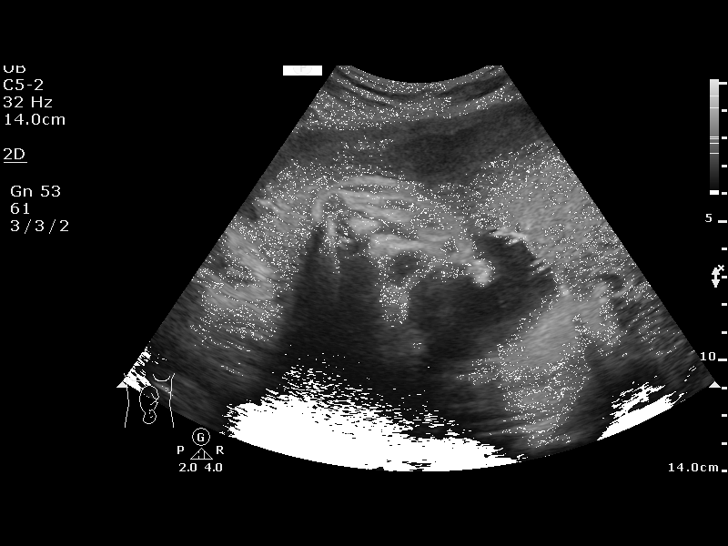
[im 14/14]
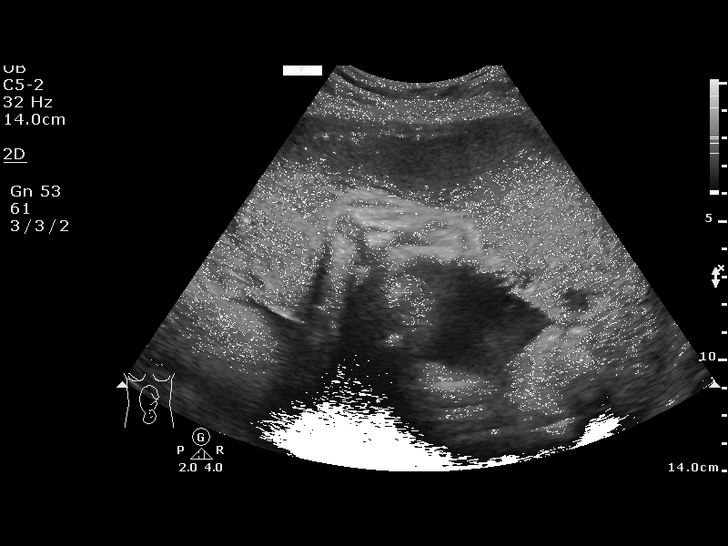

[13 of 14 positions shown; findings below may reference images not displayed]

[facility]                                  [REDACTED]
Healthcare
OB/Gyn Clinic

1  US FETAL BPP W/NONSTRESS                    76818.4

1  [facility]              [PHONE_NUMBER]      [PHONE_NUMBER]     [PHONE_NUMBER]
Service(s) Provided

Indications

34 weeks gestation of pregnancy
Unspecified pre-existing hypertension          [TB]
complicating pregnancy, third trimester
OB History

Blood Type:            Height:  5'2"   Weight (lb):  238       BMI:
Gravidity:    5         Term:   4
Living:       4
Fetal Evaluation

Num Of Fetuses:     1
Preg. Location:     Intrauterine
Cardiac Activity:   Observed
Presentation:       Cephalic
Amniotic Fluid
AFI FV:      Subjectively within normal limits

AFI Sum(cm)     %Tile       Largest Pocket(cm)
11.39           30

RUQ(cm)       RLQ(cm)       LUQ(cm)        LLQ(cm)
2.89
Biophysical Evaluation

Amniotic F.V:   Pocket => 2 cm two         F. Tone:        Observed
planes
F. Movement:    Observed                   N.S.T:          Reactive
F. Breathing:   Observed                   Score:          [DATE]
Gestational Age

LMP:           39w 3d        Date:  [DATE]                 EDD:   [DATE]
Best:          34w 2d     Det. By:  U/S  ([DATE])          EDD:   [DATE]
Impression

Recommendations

Return 1 week for NST/BPP

## 2016-11-19 NOTE — Progress Notes (Signed)
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
## 2016-11-19 NOTE — Progress Notes (Signed)
Pt states she is constipated and also is not sleeping well.  Pt scored 11 on PHQ-9 survery today. She was offered and encouraged to meet with [facility].  She declined.

## 2016-11-21 LAB — HIV-1 RNA QUANT-NO REFLEX-BLD: HIV-1 RNA Viral Load: <20 copies/mL

## 2016-11-27 ENCOUNTER — Ambulatory Visit (INDEPENDENT_AMBULATORY_CARE_PROVIDER_SITE_OTHER): Payer: Medicaid Other | Admitting: Family Medicine

## 2016-11-27 ENCOUNTER — Ambulatory Visit: Payer: Self-pay

## 2016-11-27 ENCOUNTER — Ambulatory Visit (INDEPENDENT_AMBULATORY_CARE_PROVIDER_SITE_OTHER): Payer: Medicaid Other | Admitting: *Deleted

## 2016-11-27 VITALS — BP 135/84 | HR 93 | Wt 258.0 lb

## 2016-11-27 DIAGNOSIS — O10913 Unspecified pre-existing hypertension complicating pregnancy, third trimester: Secondary | ICD-10-CM

## 2016-11-27 DIAGNOSIS — O0990 Supervision of high risk pregnancy, unspecified, unspecified trimester: Secondary | ICD-10-CM

## 2016-11-27 DIAGNOSIS — O0993 Supervision of high risk pregnancy, unspecified, third trimester: Secondary | ICD-10-CM

## 2016-11-27 DIAGNOSIS — O98713 Human immunodeficiency virus [HIV] disease complicating pregnancy, third trimester: Secondary | ICD-10-CM

## 2016-11-27 DIAGNOSIS — Z21 Asymptomatic human immunodeficiency virus [HIV] infection status: Secondary | ICD-10-CM

## 2016-11-27 LAB — POCT URINALYSIS DIP (DEVICE)
BILIRUBIN URINE: NEGATIVE
Glucose, UA: NEGATIVE mg/dL
Hgb urine dipstick: NEGATIVE
Ketones, ur: NEGATIVE mg/dL
LEUKOCYTES UA: NEGATIVE
NITRITE: NEGATIVE
Protein, ur: NEGATIVE mg/dL
Specific Gravity, Urine: 1.015 (ref 1.005–1.030)
Urobilinogen, UA: 0.2 mg/dL (ref 0.0–1.0)
pH: 5.5 (ref 5.0–8.0)

## 2016-11-27 IMAGING — US US FETAL BPP W/ NON-STRESS
1 series · 13 of 13 positions shown · non-contrast
Comparison: none

[Series 1: us fetal bpp w/nonstress · 13 acquisitions, 13 frames shown]
[im 1/13]
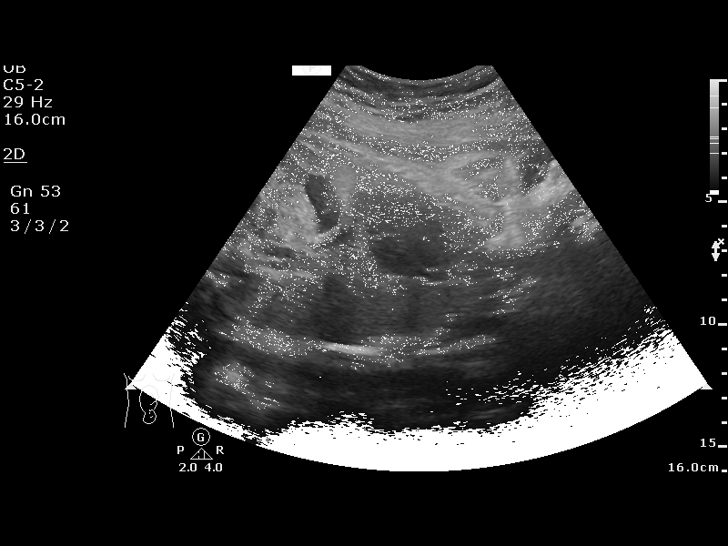
[im 2/13]
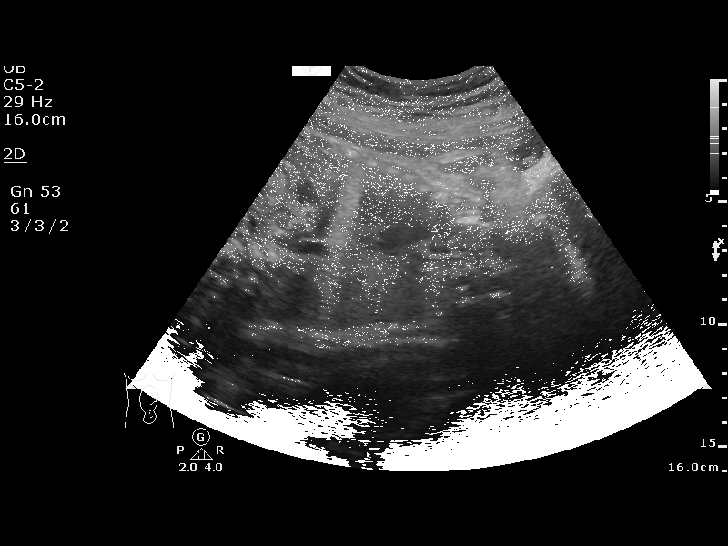
[im 3/13]
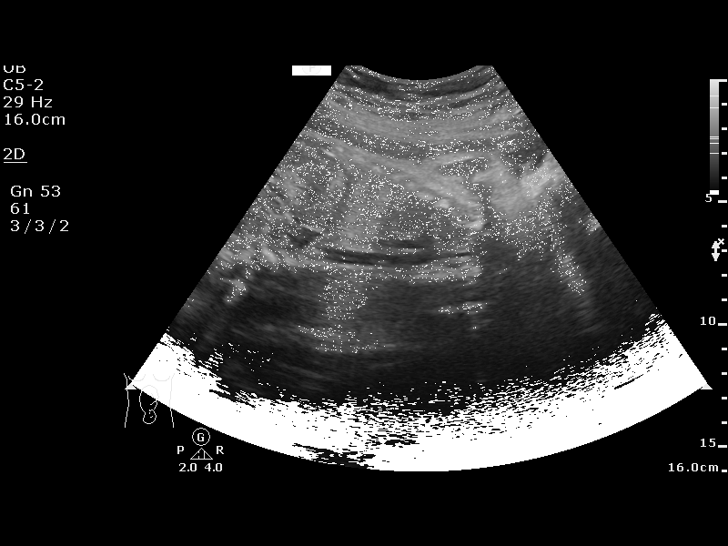
[im 4/13]
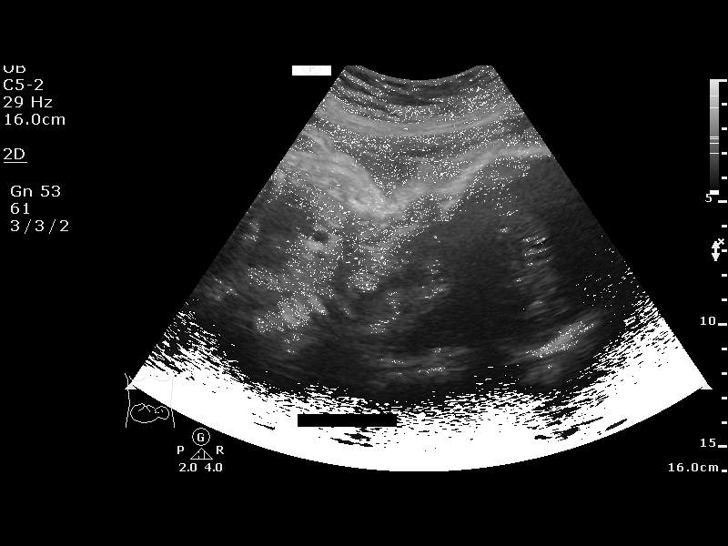
[im 5/13]
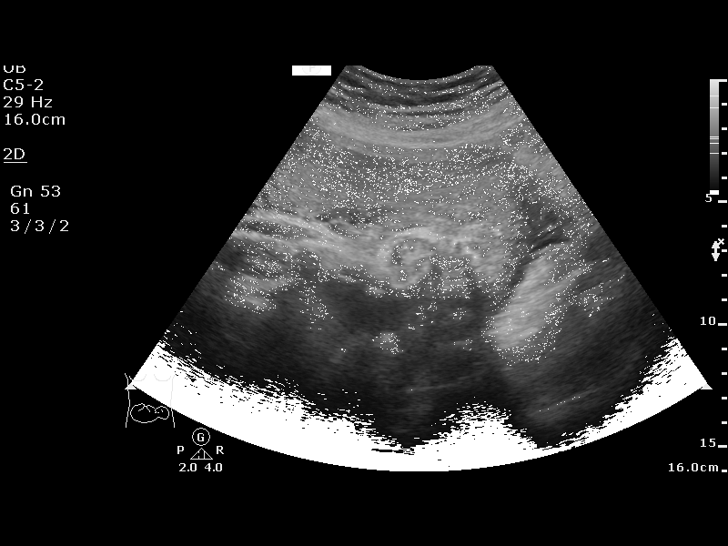
[im 6/13]
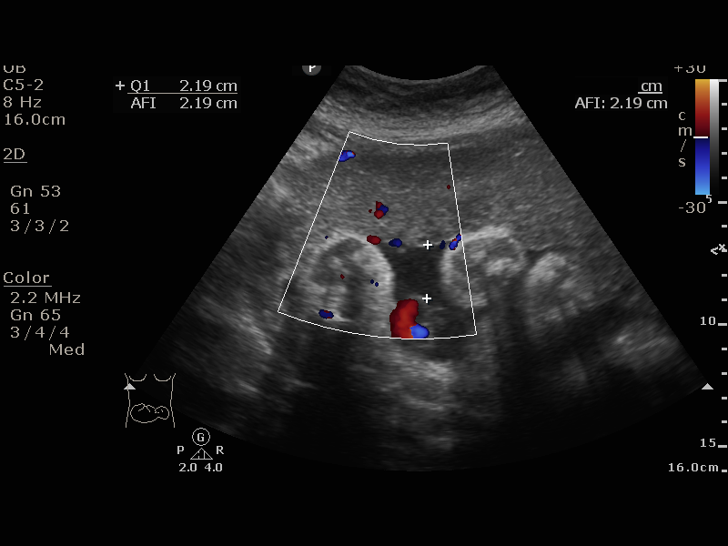
[im 7/13]
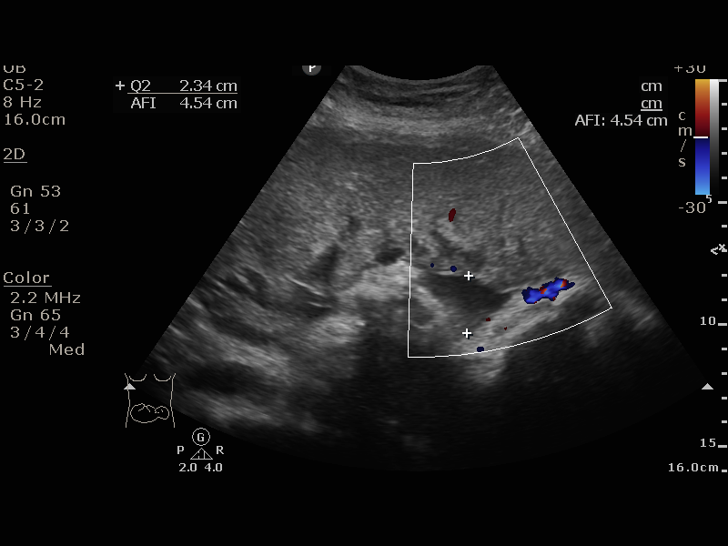
[im 8/13]
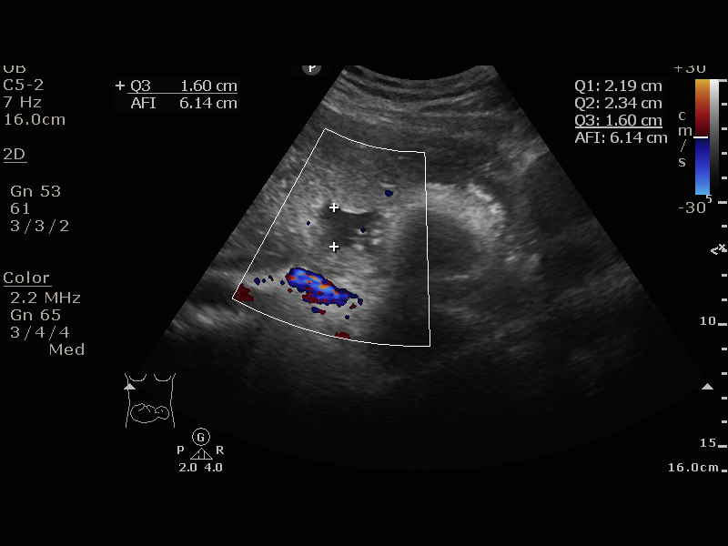
[im 9/13]
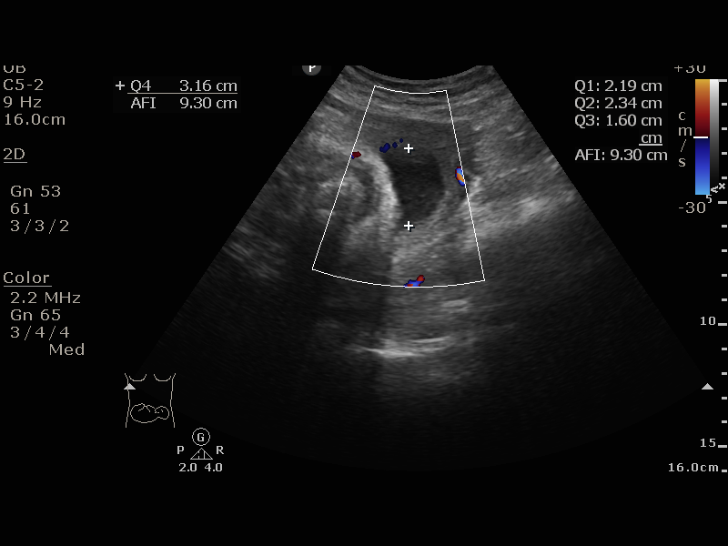
[im 10/13]
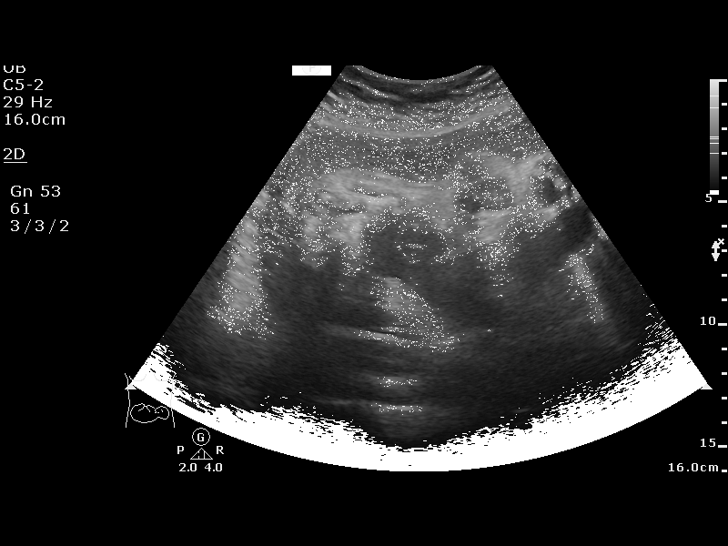
[im 11/13]
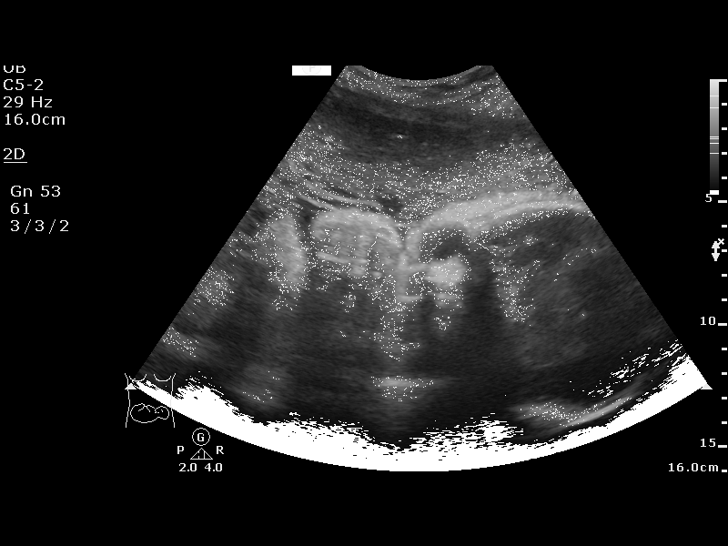
[im 12/13]
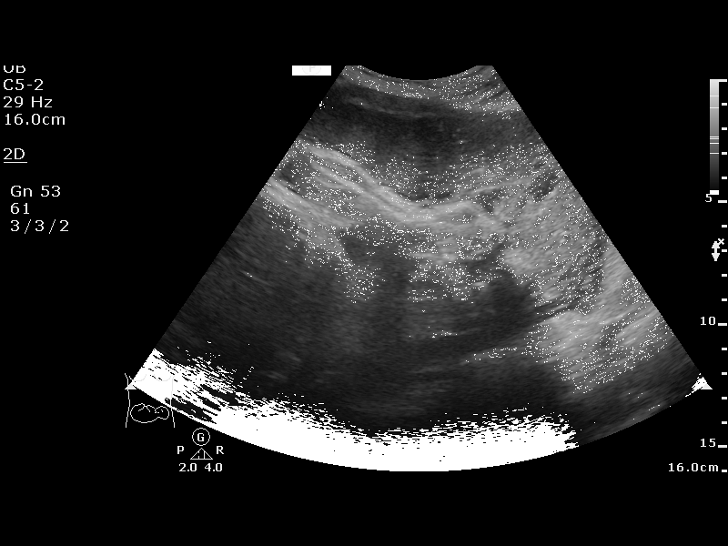
[im 13/13]
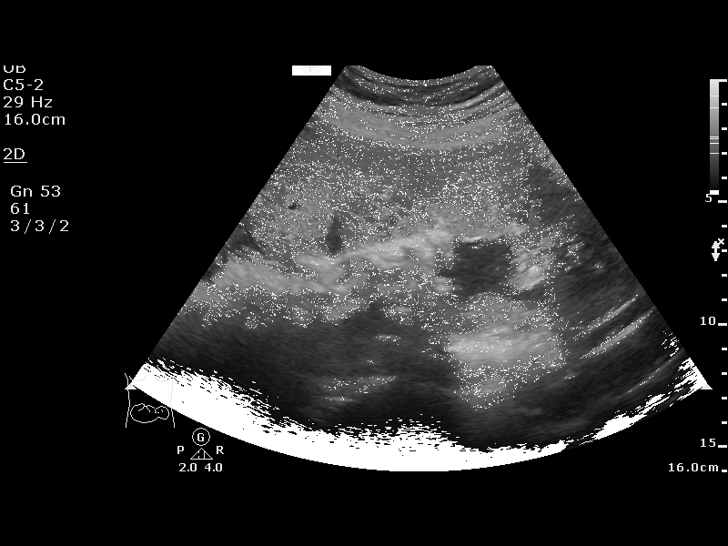

[13 of 13 positions shown; findings below may reference images not displayed]

[facility]                               [facility]                                  [REDACTED]
Healthcare
OB/Gyn Clinic

1  US FETAL BPP W/NONSTRESS                    76818.4

1  [facility]              [PHONE_NUMBER]      [PHONE_NUMBER]     [PHONE_NUMBER]
Service(s) Provided

Indications

35 weeks gestation of pregnancy
Unspecified pre-existing hypertension          [GF]
complicating pregnancy, third trimester
OB History

Blood Type:            Height:  5'2"   Weight (lb):  238       BMI:
Gravidity:    5         Term:   4
Living:       4
Fetal Evaluation

Num Of Fetuses:     1
Preg. Location:     Intrauterine
Cardiac Activity:   Observed
Presentation:       Cephalic
Amniotic Fluid
AFI FV:      Subjectively low-normal

AFI Sum(cm)     %Tile       Largest Pocket(cm)
9.29            16

RUQ(cm)       RLQ(cm)       LUQ(cm)        LLQ(cm)
2.19
Biophysical Evaluation

Amniotic F.V:   Pocket => 2 cm two         F. Tone:        Observed
planes
F. Movement:    Observed                   N.S.T:          Reactive
F. Breathing:   Observed                   Score:          [DATE]
Gestational Age

LMP:           40w 4d        Date:  [DATE]                 EDD:   [DATE]
Best:          35w 3d     Det. By:  U/S  ([DATE])          EDD:   [DATE]
Impression

BPP [DATE]
Recommendations

Continue antepartum testing.
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
## 2016-11-27 NOTE — Patient Instructions (Signed)
Breastfeeding Deciding to breastfeed is one of the best choices you can make for you and your baby. A change in hormones during pregnancy causes your breast tissue to grow and increases the number and size of your milk ducts. These hormones also allow proteins, sugars, and fats from your blood supply to make breast milk in your milk-producing glands. Hormones prevent breast milk from being released before your baby is born as well as prompt milk flow after birth. Once breastfeeding has begun, thoughts of your baby, as well as his or her sucking or crying, can stimulate the release of milk from your milk-producing glands. Benefits of breastfeeding For Your Baby  Your first milk (colostrum) helps your baby's digestive system function better.  There are antibodies in your milk that help your baby fight off infections.  Your baby has a lower incidence of asthma, allergies, and sudden infant death syndrome.  The nutrients in breast milk are better for your baby than infant formulas and are designed uniquely for your baby's needs.  Breast milk improves your baby's brain development.  Your baby is less likely to develop other conditions, such as childhood obesity, asthma, or type 2 diabetes mellitus.  For You  Breastfeeding helps to create a very special bond between you and your baby.  Breastfeeding is convenient. Breast milk is always available at the correct temperature and costs nothing.  Breastfeeding helps to burn calories and helps you lose the weight gained during pregnancy.  Breastfeeding makes your uterus contract to its prepregnancy size faster and slows bleeding (lochia) after you give birth.  Breastfeeding helps to lower your risk of developing type 2 diabetes mellitus, osteoporosis, and breast or ovarian cancer later in life.  Signs that your baby is hungry Early Signs of Hunger  Increased alertness or activity.  Stretching.  Movement of the head from side to  side.  Movement of the head and opening of the mouth when the corner of the mouth or cheek is stroked (rooting).  Increased sucking sounds, smacking lips, cooing, sighing, or squeaking.  Hand-to-mouth movements.  Increased sucking of fingers or hands.  Late Signs of Hunger  Fussing.  Intermittent crying.  Extreme Signs of Hunger Signs of extreme hunger will require calming and consoling before your baby will be able to breastfeed successfully. Do not wait for the following signs of extreme hunger to occur before you initiate breastfeeding:  Restlessness.  A loud, strong cry.  Screaming.  Breastfeeding basics Breastfeeding Initiation  Find a comfortable place to sit or lie down, with your neck and back well supported.  Place a pillow or rolled up blanket under your baby to bring him or her to the level of your breast (if you are seated). Nursing pillows are specially designed to help support your arms and your baby while you breastfeed.  Make sure that your baby's abdomen is facing your abdomen.  Gently massage your breast. With your fingertips, massage from your chest wall toward your nipple in a circular motion. This encourages milk flow. You may need to continue this action during the feeding if your milk flows slowly.  Support your breast with 4 fingers underneath and your thumb above your nipple. Make sure your fingers are well away from your nipple and your baby's mouth.  Stroke your baby's lips gently with your finger or nipple.  When your baby's mouth is open wide enough, quickly bring your baby to your breast, placing your entire nipple and as much of the colored area   around your nipple (areola) as possible into your baby's mouth. ? More areola should be visible above your baby's upper lip than below the lower lip. ? Your baby's tongue should be between his or her lower gum and your breast.  Ensure that your baby's mouth is correctly positioned around your nipple  (latched). Your baby's lips should create a seal on your breast and be turned out (everted).  It is common for your baby to suck about 2-3 minutes in order to start the flow of breast milk.  Latching Teaching your baby how to latch on to your breast properly is very important. An improper latch can cause nipple pain and decreased milk supply for you and poor weight gain in your baby. Also, if your baby is not latched onto your nipple properly, he or she may swallow some air during feeding. This can make your baby fussy. Burping your baby when you switch breasts during the feeding can help to get rid of the air. However, teaching your baby to latch on properly is still the best way to prevent fussiness from swallowing air while breastfeeding. Signs that your baby has successfully latched on to your nipple:  Silent tugging or silent sucking, without causing you pain.  Swallowing heard between every 3-4 sucks.  Muscle movement above and in front of his or her ears while sucking.  Signs that your baby has not successfully latched on to nipple:  Sucking sounds or smacking sounds from your baby while breastfeeding.  Nipple pain.  If you think your baby has not latched on correctly, slip your finger into the corner of your baby's mouth to break the suction and place it between your baby's gums. Attempt breastfeeding initiation again. Signs of Successful Breastfeeding Signs from your baby:  A gradual decrease in the number of sucks or complete cessation of sucking.  Falling asleep.  Relaxation of his or her body.  Retention of a small amount of milk in his or her mouth.  Letting go of your breast by himself or herself.  Signs from you:  Breasts that have increased in firmness, weight, and size 1-3 hours after feeding.  Breasts that are softer immediately after breastfeeding.  Increased milk volume, as well as a change in milk consistency and color by the fifth day of  breastfeeding.  Nipples that are not sore, cracked, or bleeding.  Signs That Your Baby is Getting Enough Milk  Wetting at least 1-2 diapers during the first 24 hours after birth.  Wetting at least 5-6 diapers every 24 hours for the first week after birth. The urine should be clear or pale yellow by 5 days after birth.  Wetting 6-8 diapers every 24 hours as your baby continues to grow and develop.  At least 3 stools in a 24-hour period by age 5 days. The stool should be soft and yellow.  At least 3 stools in a 24-hour period by age 7 days. The stool should be seedy and yellow.  No loss of weight greater than 10% of birth weight during the first 3 days of age.  Average weight gain of 4-7 ounces (113-198 g) per week after age 4 days.  Consistent daily weight gain by age 5 days, without weight loss after the age of 2 weeks.  After a feeding, your baby may spit up a small amount. This is common. Breastfeeding frequency and duration Frequent feeding will help you make more milk and can prevent sore nipples and breast engorgement. Breastfeed when   you feel the need to reduce the fullness of your breasts or when your baby shows signs of hunger. This is called "breastfeeding on demand." Avoid introducing a pacifier to your baby while you are working to establish breastfeeding (the first 4-6 weeks after your baby is born). After this time you may choose to use a pacifier. Research has shown that pacifier use during the first year of a baby's life decreases the risk of sudden infant death syndrome (SIDS). Allow your baby to feed on each breast as long as he or she wants. Breastfeed until your baby is finished feeding. When your baby unlatches or falls asleep while feeding from the first breast, offer the second breast. Because newborns are often sleepy in the first few weeks of life, you may need to awaken your baby to get him or her to feed. Breastfeeding times will vary from baby to baby. However,  the following rules can serve as a guide to help you ensure that your baby is properly fed:  Newborns (babies 4 weeks of age or younger) may breastfeed every 1-3 hours.  Newborns should not go longer than 3 hours during the day or 5 hours during the night without breastfeeding.  You should breastfeed your baby a minimum of 8 times in a 24-hour period until you begin to introduce solid foods to your baby at around 6 months of age.  Breast milk pumping Pumping and storing breast milk allows you to ensure that your baby is exclusively fed your breast milk, even at times when you are unable to breastfeed. This is especially important if you are going back to work while you are still breastfeeding or when you are not able to be present during feedings. Your lactation consultant can give you guidelines on how long it is safe to store breast milk. A breast pump is a machine that allows you to pump milk from your breast into a sterile bottle. The pumped breast milk can then be stored in a refrigerator or freezer. Some breast pumps are operated by hand, while others use electricity. Ask your lactation consultant which type will work best for you. Breast pumps can be purchased, but some hospitals and breastfeeding support groups lease breast pumps on a monthly basis. A lactation consultant can teach you how to hand express breast milk, if you prefer not to use a pump. Caring for your breasts while you breastfeed Nipples can become dry, cracked, and sore while breastfeeding. The following recommendations can help keep your breasts moisturized and healthy:  Avoid using soap on your nipples.  Wear a supportive bra. Although not required, special nursing bras and tank tops are designed to allow access to your breasts for breastfeeding without taking off your entire bra or top. Avoid wearing underwire-style bras or extremely tight bras.  Air dry your nipples for 3-4minutes after each feeding.  Use only cotton  bra pads to absorb leaked breast milk. Leaking of breast milk between feedings is normal.  Use lanolin on your nipples after breastfeeding. Lanolin helps to maintain your skin's normal moisture barrier. If you use pure lanolin, you do not need to wash it off before feeding your baby again. Pure lanolin is not toxic to your baby. You may also hand express a few drops of breast milk and gently massage that milk into your nipples and allow the milk to air dry.  In the first few weeks after giving birth, some women experience extremely full breasts (engorgement). Engorgement can make your   breasts feel heavy, warm, and tender to the touch. Engorgement peaks within 3-5 days after you give birth. The following recommendations can help ease engorgement:  Completely empty your breasts while breastfeeding or pumping. You may want to start by applying warm, moist heat (in the shower or with warm water-soaked hand towels) just before feeding or pumping. This increases circulation and helps the milk flow. If your baby does not completely empty your breasts while breastfeeding, pump any extra milk after he or she is finished.  Wear a snug bra (nursing or regular) or tank top for 1-2 days to signal your body to slightly decrease milk production.  Apply ice packs to your breasts, unless this is too uncomfortable for you.  Make sure that your baby is latched on and positioned properly while breastfeeding.  If engorgement persists after 48 hours of following these recommendations, contact your health care provider or a lactation consultant. Overall health care recommendations while breastfeeding  Eat healthy foods. Alternate between meals and snacks, eating 3 of each per day. Because what you eat affects your breast milk, some of the foods may make your baby more irritable than usual. Avoid eating these foods if you are sure that they are negatively affecting your baby.  Drink milk, fruit juice, and water to  satisfy your thirst (about 10 glasses a day).  Rest often, relax, and continue to take your prenatal vitamins to prevent fatigue, stress, and anemia.  Continue breast self-awareness checks.  Avoid chewing and smoking tobacco. Chemicals from cigarettes that pass into breast milk and exposure to secondhand smoke may harm your baby.  Avoid alcohol and drug use, including marijuana. Some medicines that may be harmful to your baby can pass through breast milk. It is important to ask your health care provider before taking any medicine, including all over-the-counter and prescription medicine as well as vitamin and herbal supplements. It is possible to become pregnant while breastfeeding. If birth control is desired, ask your health care provider about options that will be safe for your baby. Contact a health care provider if:  You feel like you want to stop breastfeeding or have become frustrated with breastfeeding.  You have painful breasts or nipples.  Your nipples are cracked or bleeding.  Your breasts are red, tender, or warm.  You have a swollen area on either breast.  You have a fever or chills.  You have nausea or vomiting.  You have drainage other than breast milk from your nipples.  Your breasts do not become full before feedings by the fifth day after you give birth.  You feel sad and depressed.  Your baby is too sleepy to eat well.  Your baby is having trouble sleeping.  Your baby is wetting less than 3 diapers in a 24-hour period.  Your baby has less than 3 stools in a 24-hour period.  Your baby's skin or the white part of his or her eyes becomes yellow.  Your baby is not gaining weight by 5 days of age. Get help right away if:  Your baby is overly tired (lethargic) and does not want to wake up and feed.  Your baby develops an unexplained fever. This information is not intended to replace advice given to you by your health care provider. Make sure you discuss  any questions you have with your health care provider. Document Released: 01/26/2005 Document Revised: 07/10/2015 Document Reviewed: 07/20/2012 Elsevier Interactive Patient Education  2017 [employer]  

## 2016-11-27 NOTE — Progress Notes (Signed)
Pt informed that the ultrasound is considered a limited OB ultrasound and is not intended to be a complete ultrasound exam.  Patient also informed that the ultrasound is not being completed with the intent of assessing for fetal or placental anomalies or any pelvic abnormalities.  Explained that the purpose of today's ultrasound is to assess for presentation, BPP and amniotic fluid volume.  Patient acknowledges the purpose of the exam and the limitations of the study.    Pt declines to see Behavioral Health Clinician - Asher MuirJamie. She states she is just tired.

## 2016-11-30 MED FILL — ISENTRESS HD 600 MG TAB: 600 | 30 days supply | Qty: 60 | Fill #4

## 2016-11-30 MED FILL — TRUVADA 200-300 MG TABS: 200-300 | 30 days supply | Qty: 30 | Fill #5

## 2016-12-03 ENCOUNTER — Other Ambulatory Visit (HOSPITAL_COMMUNITY)
Admission: RE | Admit: 2016-12-03 | Discharge: 2016-12-03 | Disposition: A | Discharge status: Home / Self Care | Payer: Medicaid Other | Source: Ambulatory Visit | Attending: Obstetrics and Gynecology | Admitting: Obstetrics and Gynecology

## 2016-12-03 ENCOUNTER — Ambulatory Visit (INDEPENDENT_AMBULATORY_CARE_PROVIDER_SITE_OTHER): Payer: Medicaid Other | Admitting: Obstetrics and Gynecology

## 2016-12-03 ENCOUNTER — Ambulatory Visit: Payer: Medicaid Other | Admitting: *Deleted

## 2016-12-03 ENCOUNTER — Ambulatory Visit: Payer: Self-pay

## 2016-12-03 ENCOUNTER — Encounter (HOSPITAL_COMMUNITY): Payer: Self-pay | Admitting: *Deleted

## 2016-12-03 ENCOUNTER — Inpatient Hospital Stay (HOSPITAL_COMMUNITY)
Admission: AD | Admit: 2016-12-03 | Discharge: 2016-12-03 | Disposition: A | Discharge status: Home / Self Care | Payer: Medicaid Other | Source: Ambulatory Visit | Attending: Obstetrics and Gynecology | Admitting: Obstetrics and Gynecology

## 2016-12-03 VITALS — BP 140/89 | HR 110 | Wt 256.4 lb

## 2016-12-03 DIAGNOSIS — O0990 Supervision of high risk pregnancy, unspecified, unspecified trimester: Secondary | ICD-10-CM | POA: Diagnosis not present

## 2016-12-03 DIAGNOSIS — Z809 Family history of malignant neoplasm, unspecified: Secondary | ICD-10-CM | POA: Insufficient documentation

## 2016-12-03 DIAGNOSIS — Z833 Family history of diabetes mellitus: Secondary | ICD-10-CM | POA: Insufficient documentation

## 2016-12-03 DIAGNOSIS — O10913 Unspecified pre-existing hypertension complicating pregnancy, third trimester: Secondary | ICD-10-CM

## 2016-12-03 DIAGNOSIS — D573 Sickle-cell trait: Secondary | ICD-10-CM | POA: Diagnosis not present

## 2016-12-03 DIAGNOSIS — O26893 Other specified pregnancy related conditions, third trimester: Secondary | ICD-10-CM | POA: Diagnosis not present

## 2016-12-03 DIAGNOSIS — Z87891 Personal history of nicotine dependence: Secondary | ICD-10-CM | POA: Diagnosis not present

## 2016-12-03 DIAGNOSIS — Z7982 Long term (current) use of aspirin: Secondary | ICD-10-CM | POA: Insufficient documentation

## 2016-12-03 DIAGNOSIS — Z8619 Personal history of other infectious and parasitic diseases: Secondary | ICD-10-CM | POA: Insufficient documentation

## 2016-12-03 DIAGNOSIS — R51 Headache: Secondary | ICD-10-CM | POA: Insufficient documentation

## 2016-12-03 DIAGNOSIS — D571 Sickle-cell disease without crisis: Secondary | ICD-10-CM | POA: Diagnosis not present

## 2016-12-03 DIAGNOSIS — Z21 Asymptomatic human immunodeficiency virus [HIV] infection status: Secondary | ICD-10-CM

## 2016-12-03 DIAGNOSIS — O98713 Human immunodeficiency virus [HIV] disease complicating pregnancy, third trimester: Secondary | ICD-10-CM

## 2016-12-03 DIAGNOSIS — Z8249 Family history of ischemic heart disease and other diseases of the circulatory system: Secondary | ICD-10-CM | POA: Insufficient documentation

## 2016-12-03 DIAGNOSIS — O99013 Anemia complicating pregnancy, third trimester: Secondary | ICD-10-CM | POA: Insufficient documentation

## 2016-12-03 DIAGNOSIS — Z9049 Acquired absence of other specified parts of digestive tract: Secondary | ICD-10-CM | POA: Diagnosis not present

## 2016-12-03 DIAGNOSIS — O163 Unspecified maternal hypertension, third trimester: Secondary | ICD-10-CM | POA: Insufficient documentation

## 2016-12-03 DIAGNOSIS — O0993 Supervision of high risk pregnancy, unspecified, third trimester: Secondary | ICD-10-CM

## 2016-12-03 DIAGNOSIS — Z8379 Family history of other diseases of the digestive system: Secondary | ICD-10-CM | POA: Insufficient documentation

## 2016-12-03 DIAGNOSIS — Z3A36 36 weeks gestation of pregnancy: Secondary | ICD-10-CM | POA: Diagnosis not present

## 2016-12-03 DIAGNOSIS — Z9889 Other specified postprocedural states: Secondary | ICD-10-CM | POA: Diagnosis not present

## 2016-12-03 DIAGNOSIS — Z885 Allergy status to narcotic agent status: Secondary | ICD-10-CM | POA: Insufficient documentation

## 2016-12-03 DIAGNOSIS — Z113 Encounter for screening for infections with a predominantly sexual mode of transmission: Secondary | ICD-10-CM | POA: Diagnosis not present

## 2016-12-03 DIAGNOSIS — R519 Headache, unspecified: Secondary | ICD-10-CM

## 2016-12-03 DIAGNOSIS — Z79899 Other long term (current) drug therapy: Secondary | ICD-10-CM | POA: Insufficient documentation

## 2016-12-03 LAB — POCT URINALYSIS DIP (DEVICE)
BILIRUBIN URINE: NEGATIVE
GLUCOSE, UA: NEGATIVE mg/dL
HGB URINE DIPSTICK: NEGATIVE
KETONES UR: NEGATIVE mg/dL
Nitrite: NEGATIVE
Protein, ur: NEGATIVE mg/dL
SPECIFIC GRAVITY, URINE: 1.01 (ref 1.005–1.030)
Urobilinogen, UA: 0.2 mg/dL (ref 0.0–1.0)
pH: 5.5 (ref 5.0–8.0)

## 2016-12-03 LAB — CBC
HEMATOCRIT: 29.6 % — AB (ref 36.0–46.0)
HEMOGLOBIN: 9.7 g/dL — AB (ref 12.0–15.0)
MCH: 23 pg — AB (ref 26.0–34.0)
MCHC: 32.8 g/dL (ref 30.0–36.0)
MCV: 70.1 fL — AB (ref 78.0–100.0)
Platelets: 279 10*3/uL (ref 150–400)
RBC: 4.22 MIL/uL (ref 3.87–5.11)
RDW: 14.5 % (ref 11.5–15.5)
WBC: 7.3 10*3/uL (ref 4.0–10.5)

## 2016-12-03 LAB — URINALYSIS, ROUTINE W REFLEX MICROSCOPIC
Bilirubin Urine: NEGATIVE
Glucose, UA: NEGATIVE mg/dL
Hgb urine dipstick: NEGATIVE
KETONES UR: NEGATIVE mg/dL
LEUKOCYTES UA: NEGATIVE
Nitrite: NEGATIVE
PH: 5 (ref 5.0–8.0)
PROTEIN: NEGATIVE mg/dL
Specific Gravity, Urine: 1.012 (ref 1.005–1.030)

## 2016-12-03 LAB — OB RESULTS CONSOLE GBS: GBS: NEGATIVE

## 2016-12-03 LAB — COMPREHENSIVE METABOLIC PANEL
ALK PHOS: 135 U/L — AB (ref 38–126)
ALT: 10 U/L — AB (ref 14–54)
ANION GAP: 8 (ref 5–15)
AST: 13 U/L — ABNORMAL LOW (ref 15–41)
Albumin: 2.8 g/dL — ABNORMAL LOW (ref 3.5–5.0)
BILIRUBIN TOTAL: 0.5 mg/dL (ref 0.3–1.2)
BUN: 8 mg/dL (ref 6–20)
CALCIUM: 8.8 mg/dL — AB (ref 8.9–10.3)
CO2: 20 mmol/L — AB (ref 22–32)
CREATININE: 0.44 mg/dL (ref 0.44–1.00)
Chloride: 106 mmol/L (ref 101–111)
GFR calc non Af Amer: >60 mL/min (ref >60)
Glucose, Bld: 86 mg/dL (ref 65–99)
Potassium: 3.9 mmol/L (ref 3.5–5.1)
SODIUM: 134 mmol/L — AB (ref 135–145)
TOTAL PROTEIN: 6.9 g/dL (ref 6.5–8.1)

## 2016-12-03 LAB — PROTEIN / CREATININE RATIO, URINE: Creatinine, Urine: 84 mg/dL

## 2016-12-03 IMAGING — US US FETAL BPP W/ NON-STRESS
1 series · 13 of 13 positions shown · non-contrast
Comparison: none

[Series 1: us fetal bpp w/nonstress · 13 acquisitions, 13 frames shown]
[im 1/13]
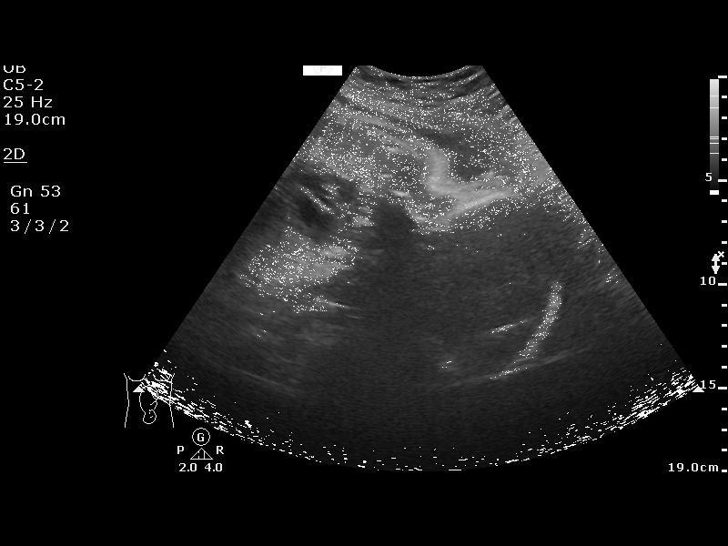
[im 2/13]
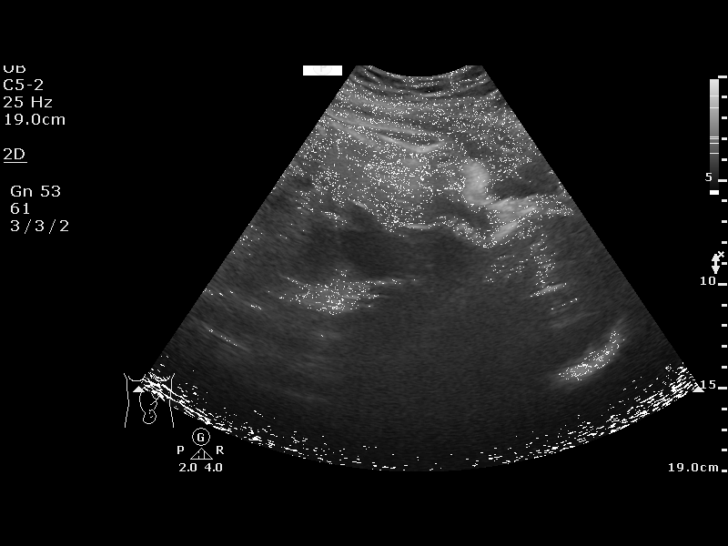
[im 3/13]
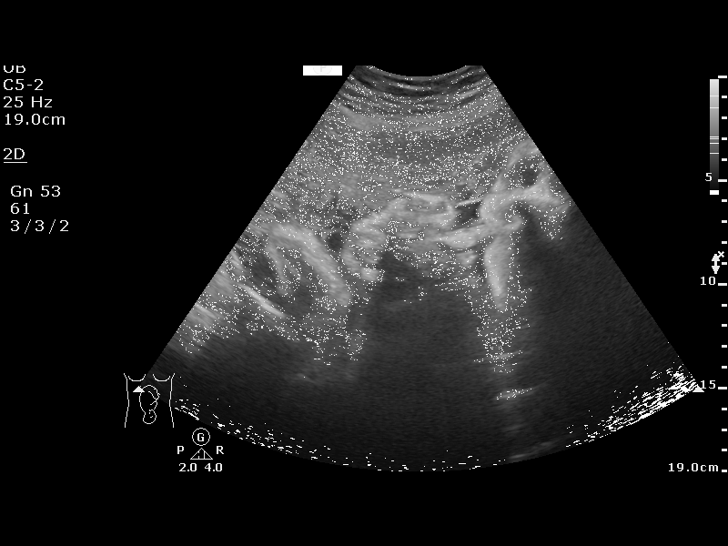
[im 4/13]
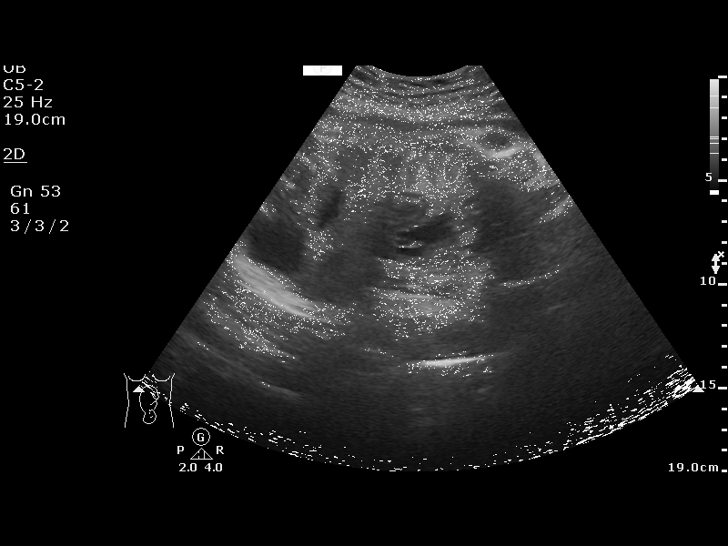
[im 5/13]
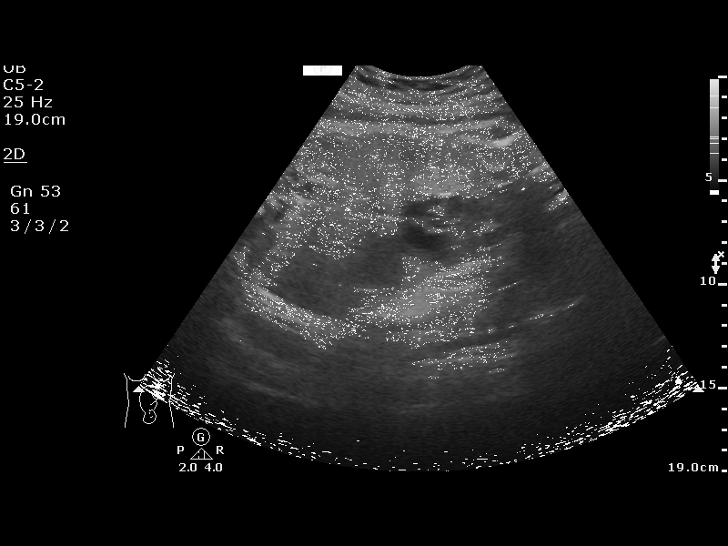
[im 6/13]
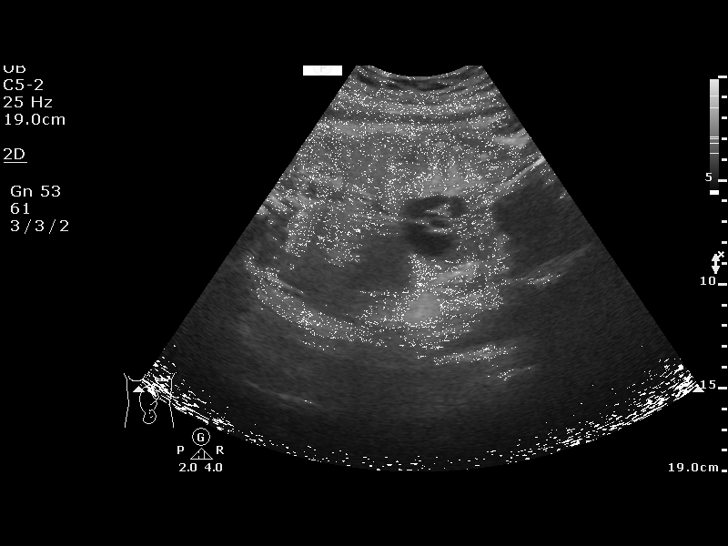
[im 7/13]
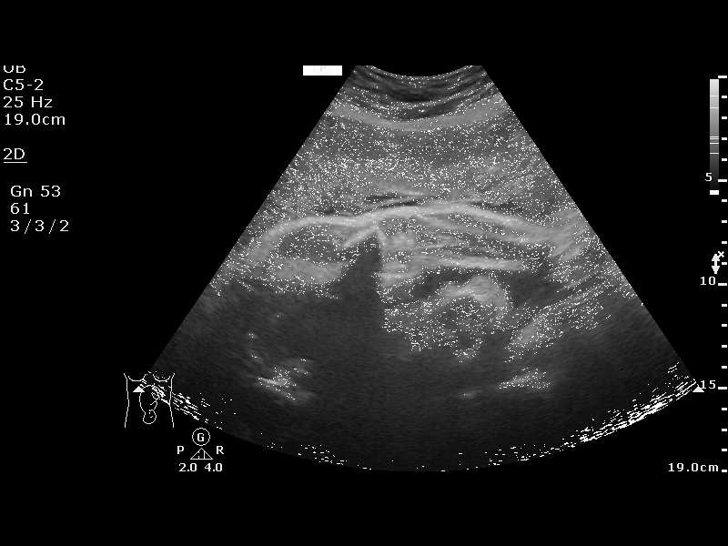
[im 8/13]
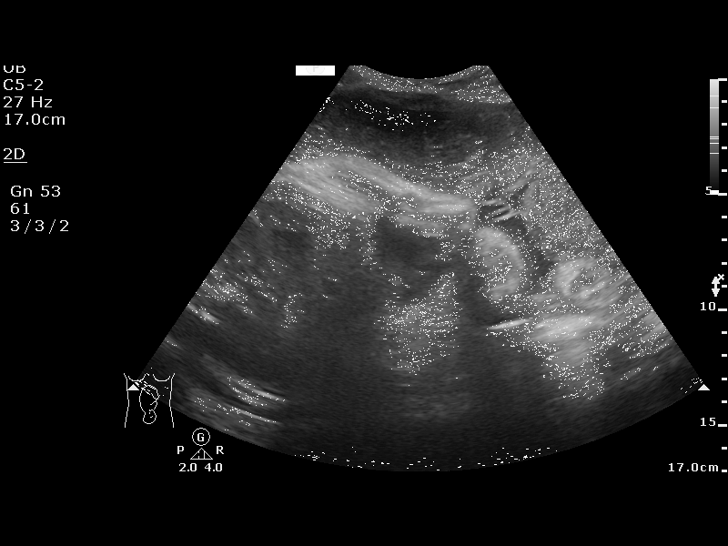
[im 9/13]
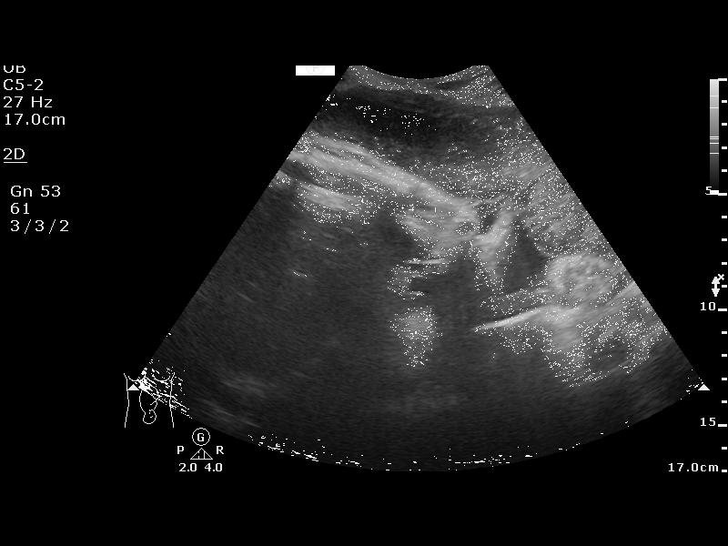
[im 10/13]
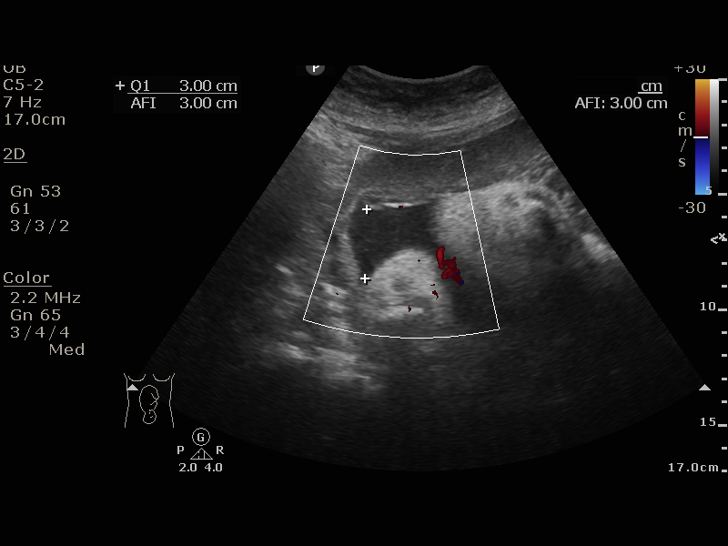
[im 11/13]
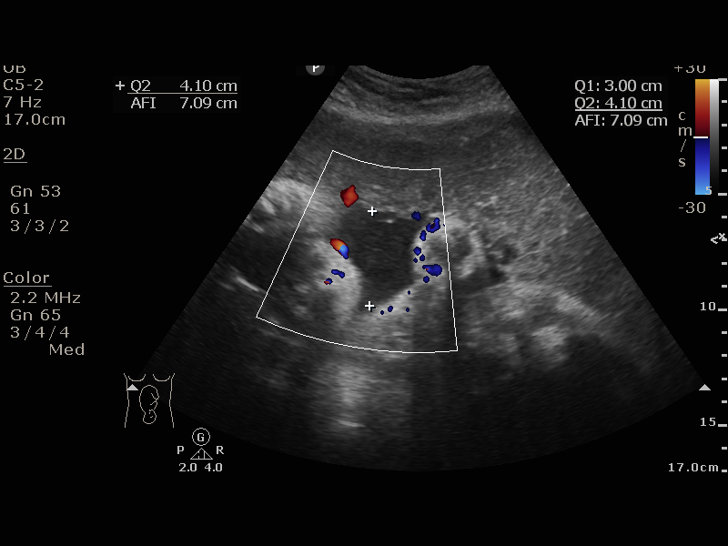
[im 12/13]
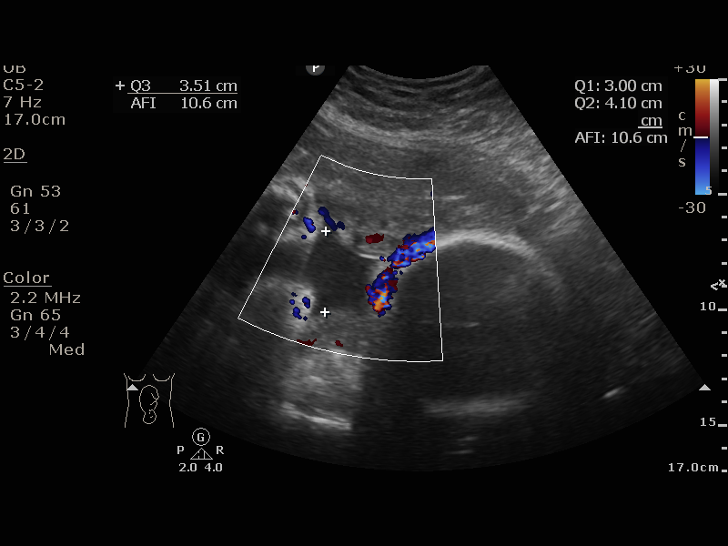
[im 13/13]
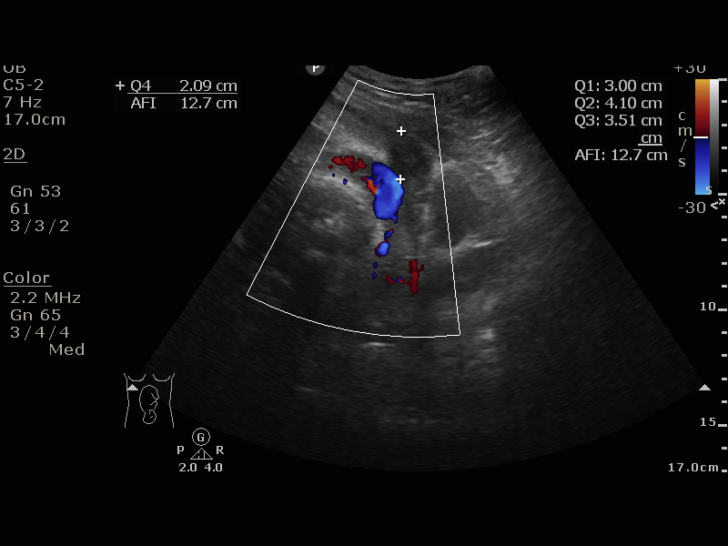

[13 of 13 positions shown; findings below may reference images not displayed]

[facility]                               [facility]
Women[facility]                                  [REDACTED]
Healthcare
OB/Gyn Clinic

1  US FETAL BPP W/NONSTRESS                    76818.4

1  [facility]              [PHONE_NUMBER]      [PHONE_NUMBER]     [PHONE_NUMBER]
Service(s) Provided

Indications

36 weeks gestation of pregnancy
Unspecified pre-existing hypertension          [35]
complicating pregnancy, third trimester
OB History

Blood Type:            Height:  5'2"   Weight (lb):  238       BMI:
Gravidity:    5         Term:   4
Living:       4
Fetal Evaluation

Num Of Fetuses:     1
Preg. Location:     Intrauterine
Cardiac Activity:   Observed
Presentation:       Cephalic
Amniotic Fluid
AFI FV:      Subjectively within normal limits

AFI Sum(cm)     %Tile       Largest Pocket(cm)
12.7            42

RUQ(cm)       RLQ(cm)       LUQ(cm)        LLQ(cm)
3
Biophysical Evaluation

Amniotic F.V:   Pocket => 2 cm two         F. Tone:        Observed
planes
F. Movement:    Observed                   N.S.T:          Reactive
F. Breathing:   Observed                   Score:          [DATE]
Gestational Age

LMP:           41w 3d        Date:  [DATE]                 EDD:   [DATE]
Best:          36w 2d     Det. By:  U/S  ([DATE])          EDD:   [DATE]
Impression

Recommendations

Cont weekly NST/BPP

## 2016-12-03 MED ORDER — ACETAMINOPHEN 500 MG PO TABS
1000.0000 mg | ORAL_TABLET | Freq: Once | ORAL | Status: AC
  Administered 2016-12-03: 1000 mg via ORAL
  Filled 2016-12-03: qty 2

## 2016-12-03 NOTE — MAU Note (Signed)
[facility].  Headache 6/10 - throbbing. Urine sample collected. Positive for blurred vision, and dizziness. Positive for fetal movement, denies vaginal bleeding, denies sudden gush of fluid.
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
## 2016-12-03 NOTE — MAU Provider Note (Signed)
History     CSN: [id number]  Arrival date and time: 12/03/16 1224   First Provider Initiated Contact with Patient 12/03/16 1310      Chief Complaint  Patient presents with  . Headache   HPI Catherine Simmons is a 31 y.o. G5P4004 at 58w2dwho presents from the clinic for BP evaluation & headache. Patient with chronic hypertension in office today for routine OB visit. Complained of headache & had BP slightly elevated from previous visit. Reports headache since Tuesday. Frontal headache that she rates 6/10. Has not treated pain. Denies visual disturbance or epigastric pain. Positive fetal movement.   OB History    Gravida Para Term Preterm AB Living   '5 4 4 ' 0   4   SAB TAB Ectopic Multiple Live Births         0 4      Past Medical History:  Diagnosis Date  . Anemia   . Cholecystitis 07/16/10  . Genital warts 2004  . Hemorrhoids   . HIV (human immunodeficiency virus infection) ([place]   . Hx of pelvic inflammatory disease 08/31/2013   And hx of multiple STDs   . Hypertension   . Pregnancy induced hypertension   . Sickle cell trait ([facility]     Past Surgical History:  Procedure Laterality Date  . CHOLECYSTECTOMY    . WISDOM TOOTH EXTRACTION      Family History  Problem Relation Age of Onset  . Cancer Mother   . Hypertension Mother   . Diabetes Mother   . Pancreatitis Mother   . Hypertension Father   . Diabetes Maternal Aunt     Social History  Substance Use Topics  . Smoking status: Former Smoker    Packs/day: 0.30    Types: Cigarettes    Start date: 03/11/2012  . Smokeless tobacco: Never Used  . Alcohol use No     Comment: occ    Allergies:  Allergies  Allergen Reactions  . Stadol [Butorphanol Tartrate] Itching    Tolerates percocet    Prescriptions Prior to Admission  Medication Sig Dispense Refill Last Dose  . acetaminophen (TYLENOL) 500 MG tablet Take 1,000 mg by mouth every 6 (six) hours as needed for mild pain, moderate pain or headache.    Taking   . aspirin 81 MG chewable tablet Chew 1 tablet (81 mg total) by mouth daily. 60 tablet 3 Taking  . docusate sodium (COLACE) 100 MG capsule Take 1 capsule (100 mg total) by mouth 2 (two) times daily as needed. (Patient taking differently: Take 100 mg by mouth 2 (two) times daily as needed for mild constipation. ) 60 capsule 3 Taking  . emtricitabine-tenofovir (TRUVADA) 200-300 MG tablet Take 1 tablet by mouth daily. 30 tablet 11 Taking  . glycerin adult 2 g suppository Place 1 suppository rectally as needed for constipation. (Patient not taking: Reported on 12/03/2016) 12 suppository 1 Not Taking  . labetalol (NORMODYNE) 100 MG tablet Take 1 tablet (100 mg total) by mouth 2 (two) times daily. 60 tablet 0 Taking  . ondansetron (ZOFRAN ODT) 8 MG disintegrating tablet Take 1 tablet (8 mg total) by mouth every 8 (eight) hours as needed for nausea or vomiting. (Patient not taking: Reported on 12/03/2016) 40 tablet 4 Not Taking  . Prenatal Vit-Fe Fumarate-FA (PRENATAL VITAMIN) 27-0.8 MG TABS Take 1 tablet by mouth daily. (Patient not taking: Reported on 12/03/2016) 30 tablet 6 Not Taking  . Raltegravir Potassium (ISENTRESS HD) 600 MG TABS Take 2 tablets by  mouth daily. 60 tablet 11 Taking    Review of Systems  Constitutional: Negative.   Eyes: Negative for photophobia and visual disturbance.  Respiratory: Negative.   Cardiovascular: Negative.   Gastrointestinal: Negative.   Genitourinary: Negative.   Neurological: Positive for headaches.   Physical Exam   Blood pressure (!) 127/92, pulse (!) 103, temperature 98 F (36.7 C), temperature source Oral, resp. rate 20, last menstrual period 02/17/2016, SpO2 100 %, unknown if currently breastfeeding. Patient Vitals for the past 24 hrs:  BP Temp Temp src Pulse Resp SpO2  12/03/16 1519 - - - - 20 100 %  12/03/16 1447 (!) 127/92 - - (!) 103 - -  12/03/16 1431 132/87 - - 89 - -  12/03/16 1416 (!) 151/110 - - 96 - -  12/03/16 1401 (!) 147/97 - - (!) 104 -  -  12/03/16 1346 (!) 159/97 - - 92 - -  12/03/16 1331 (!) 154/108 - - 94 - -  12/03/16 1324 (!) 147/104 - - (!) 101 - -  12/03/16 1301 (!) 145/109 - - 99 - -  12/03/16 1246 (!) 151/109 - - 94 - -  12/03/16 1238 (!) 137/95 98 F (36.7 C) Oral 99 20 100 %    Physical Exam  Nursing note and vitals reviewed. Constitutional: She is oriented to person, place, and time. She appears well-developed and well-nourished. No distress.  HENT:  Head: Normocephalic and atraumatic.  Eyes: Conjunctivae are normal. Right eye exhibits no discharge. Left eye exhibits no discharge. No scleral icterus.  Neck: Normal range of motion.  Cardiovascular: Normal rate, regular rhythm and normal heart sounds.   No murmur heard. Respiratory: Effort normal and breath sounds normal. No respiratory distress. She has no wheezes.  GI: Soft. There is no tenderness.  Neurological: She is alert and oriented to person, place, and time. She has normal reflexes.  No clonus  Skin: Skin is warm and dry. She is not diaphoretic.  Psychiatric: She has a normal mood and affect. Her behavior is normal. Judgment and thought content normal.   Fetal Tracing:  Baseline: 130 Variability: moderate Accelerations: 15x15 Decelerations: none  Toco: none   MAU Course  Procedures Results for orders placed or performed during the hospital encounter of 12/03/16 (from the past 48 hour(s))  Protein / creatinine ratio, urine     Status: None   Collection Time: 12/03/16 12:32 PM  Result Value Ref Range   Creatinine, Urine 84.00 mg/dL   Total Protein, Urine <6 mg/dL    Comment: REPEATED TO VERIFY NO NORMAL RANGE ESTABLISHED FOR THIS TEST    Protein Creatinine Ratio        0.00 - 0.15 mg/mg[Cre]    Comment: RESULT BELOW REPORTABLE RANGE, UNABLE TO CALCULATE.   Urinalysis, Routine w reflex microscopic     Status: Abnormal   Collection Time: 12/03/16 12:32 PM  Result Value Ref Range   Color, Urine YELLOW YELLOW   APPearance HAZY (A)  CLEAR   Specific Gravity, Urine 1.012 1.005 - 1.030   pH 5.0 5.0 - 8.0   Glucose, UA NEGATIVE NEGATIVE mg/dL   Hgb urine dipstick NEGATIVE NEGATIVE   Bilirubin Urine NEGATIVE NEGATIVE   Ketones, ur NEGATIVE NEGATIVE mg/dL   Protein, ur NEGATIVE NEGATIVE mg/dL   Nitrite NEGATIVE NEGATIVE   Leukocytes, UA NEGATIVE NEGATIVE  CBC     Status: Abnormal   Collection Time: 12/03/16 12:58 PM  Result Value Ref Range   WBC 7.3 4.0 - 10.5 K/uL  RBC 4.22 3.87 - 5.11 MIL/uL   Hemoglobin 9.7 (L) 12.0 - 15.0 g/dL    Comment: REPEATED TO VERIFY   HCT 29.6 (L) 36.0 - 46.0 %   MCV 70.1 (L) 78.0 - 100.0 fL   MCH 23.0 (L) 26.0 - 34.0 pg   MCHC 32.8 30.0 - 36.0 g/dL   RDW 14.5 11.5 - 15.5 %   Platelets 279 150 - 400 K/uL  Comprehensive metabolic panel     Status: Abnormal   Collection Time: 12/03/16 12:58 PM  Result Value Ref Range   Sodium 134 (L) 135 - 145 mmol/L   Potassium 3.9 3.5 - 5.1 mmol/L   Chloride 106 101 - 111 mmol/L   CO2 20 (L) 22 - 32 mmol/L   Glucose, Bld 86 65 - 99 mg/dL   BUN 8 6 - 20 mg/dL   Creatinine, Ser 0.44 0.44 - 1.00 mg/dL   Calcium 8.8 (L) 8.9 - 10.3 mg/dL   Total Protein 6.9 6.5 - 8.1 g/dL   Albumin 2.8 (L) 3.5 - 5.0 g/dL   AST 13 (L) 15 - 41 U/L   ALT 10 (L) 14 - 54 U/L   Alkaline Phosphatase 135 (H) 38 - 126 U/L   Total Bilirubin 0.5 0.3 - 1.2 mg/dL   GFR calc non Af Amer >60 >60 mL/min   GFR calc Af Amer >60 >60 mL/min    Comment: (NOTE) The eGFR has been calculated using the CKD EPI equation. This calculation has not been validated in all clinical situations. eGFR's persistently <60 mL/min signify possible Chronic Kidney Disease.    Anion gap 8 5 - 15    MDM Severe range BP x 1. Otherwise consistent with previous BPs in office. Labs wnl. Headache improved with tylenol.  Reactive NST.   Assessment and Plan  A: 1. Maternal chronic hypertension in third trimester   2. [redacted] weeks gestation of pregnancy   3. Headache in pregnancy, antepartum, third  trimester    P: Discharge home Keep f/u with OB Discussed reasons to return to MAU including s/s of preeclampsia   Jorje Guild 12/03/2016, 1:10 PM

## 2016-12-03 NOTE — Discharge Instructions (Signed)
Hypertension During Pregnancy °Hypertension, commonly called high blood pressure, is when the force of blood pumping through your arteries is too strong. Arteries are blood vessels that carry blood from the heart throughout the body. Hypertension during pregnancy can cause problems for you and your baby. Your baby may be born early (prematurely) or may not weigh as much as he or she should at birth. Very bad cases of hypertension during pregnancy can be life-threatening. °Different types of hypertension can occur during pregnancy. These include: °· Chronic hypertension. This happens when: °? You have hypertension before pregnancy and it continues during pregnancy. °? You develop hypertension before you are [redacted] weeks pregnant, and it continues during pregnancy. °· Gestational hypertension. This is hypertension that develops after the 20th week of pregnancy. °· Preeclampsia, also called toxemia of pregnancy. This is a very serious type of hypertension that develops only during pregnancy. It affects the whole body, and it can be very dangerous for you and your baby. ° °Gestational hypertension and preeclampsia usually go away within 6 weeks after your baby is born. Women who have hypertension during pregnancy have a greater chance of developing hypertension later in life or during future pregnancies. °What are the causes? °The exact cause of hypertension is not known. °What increases the risk? °There are certain factors that make it more likely for you to develop hypertension during pregnancy. These include: °· Having hypertension during a previous pregnancy or prior to pregnancy. °· Being overweight. °· Being older than age 40. °· Being pregnant for the first time or being pregnant with more than one baby. °· Becoming pregnant using fertilization methods such as IVF (in vitro fertilization). °· Having diabetes, kidney problems, or systemic lupus erythematosus. °· Having a family history of hypertension. ° °What are the  signs or symptoms? °Chronic hypertension and gestational hypertension rarely cause symptoms. Preeclampsia causes symptoms, which may include: °· Increased protein in your urine. Your health care provider will check for this at every visit before you give birth (prenatal visit). °· Severe headaches. °· Sudden weight gain. °· Swelling of the hands, face, legs, and feet. °· Nausea and vomiting. °· Vision problems, such as blurred or double vision. °· Numbness in the face, arms, legs, and feet. °· Dizziness. °· Slurred speech. °· Sensitivity to bright lights. °· Abdominal pain. °· Convulsions. ° °How is this diagnosed? °You may be diagnosed with hypertension during a routine prenatal exam. At each prenatal visit, you may: °· Have a urine test to check for high amounts of protein in your urine. °· Have your blood pressure checked. A blood pressure reading is recorded as two numbers, such as "120 over 80" (or 120/80). The first ("top") number is called the systolic pressure. It is a measure of the pressure in your arteries when your heart beats. The second ("bottom") number is called the diastolic pressure. It is a measure of the pressure in your arteries as your heart relaxes between beats. Blood pressure is measured in a unit called mm Hg. A normal blood pressure reading is: °? Systolic: below 120. °? Diastolic: below 80. ° °The type of hypertension that you are diagnosed with depends on your test results and when your symptoms developed. °· Chronic hypertension is usually diagnosed before 20 weeks of pregnancy. °· Gestational hypertension is usually diagnosed after 20 weeks of pregnancy. °· Hypertension with high amounts of protein in the urine is diagnosed as preeclampsia. °· Blood pressure measurements that stay above 160 systolic, or above 110 diastolic, are   signs of severe preeclampsia. ° °How is this treated? °Treatment for hypertension during pregnancy varies depending on the type of hypertension you have and how  serious it is. °· If you take medicines called ACE inhibitors to treat chronic hypertension, you may need to switch medicines. ACE inhibitors should not be taken during pregnancy. °· If you have gestational hypertension, you may need to take blood pressure medicine. °· If you are at risk for preeclampsia, your health care provider may recommend that you take a low-dose aspirin every day to prevent high blood pressure during your pregnancy. °· If you have severe preeclampsia, you may need to be hospitalized so you and your baby can be monitored closely. You may also need to take medicine (magnesium sulfate) to prevent seizures and to lower blood pressure. This medicine may be given as an injection or through an IV tube. °· In some cases, if your condition gets worse, you may need to deliver your baby early. ° °Follow these instructions at home: °Eating and drinking °· Drink enough fluid to keep your urine clear or pale yellow. °· Eat a healthy diet that is low in salt (sodium). Do not add salt to your food. Check food labels to see how much sodium a food or beverage contains. °Lifestyle °· Do not use any products that contain nicotine or tobacco, such as cigarettes and e-cigarettes. If you need help quitting, ask your health care provider. °· Do not use alcohol. °· Avoid caffeine. °· Avoid stress as much as possible. Rest and get plenty of sleep. °General instructions °· Take over-the-counter and prescription medicines only as told by your health care provider. °· While lying down, lie on your left side. This keeps pressure off your baby. °· While sitting or lying down, raise (elevate) your feet. Try putting some pillows under your lower legs. °· Exercise regularly. Ask your health care provider what kinds of exercise are best for you. °· Keep all prenatal and follow-up visits as told by your health care provider. This is important. °Contact a health care provider if: °· You have symptoms that your health care  provider told you may require more treatment or monitoring, such as: °? Fever. °? Vomiting. °? Headache. °Get help right away if: °· You have severe abdominal pain or vomiting that does not get better with treatment. °· You suddenly develop swelling in your hands, ankles, or face. °· You gain 4 lbs (1.8 kg) or more in 1 week. °· You develop vaginal bleeding, or you have blood in your urine. °· You do not feel your baby moving as much as usual. °· You have blurred or double vision. °· You have muscle twitching or sudden tightening (spasms). °· You have shortness of breath. °· Your lips or fingernails turn blue. °This information is not intended to replace advice given to you by your health care provider. Make sure you discuss any questions you have with your health care provider. °Document Released: 10/14/2010 Document Revised: 08/16/2015 Document Reviewed: 07/12/2015 °Elsevier Interactive Patient Education © 2018 [employer] ° °

## 2016-12-03 NOTE — Progress Notes (Signed)
Pt reports frequent H/A's, dizziness and seeing spots.

## 2016-12-03 NOTE — Progress Notes (Signed)

## 2016-12-04 LAB — GC/CHLAMYDIA PROBE AMP (~~LOC~~) NOT AT ARMC
CHLAMYDIA, DNA PROBE: NEGATIVE
NEISSERIA GONORRHEA: NEGATIVE

## 2016-12-05 LAB — STREP GP B NAA: STREP GROUP B AG: NEGATIVE

## 2016-12-07 ENCOUNTER — Other Ambulatory Visit: Payer: Self-pay | Admitting: Student

## 2016-12-07 MED FILL — ONDANSETRON ODT 8 MG TABLET: 8 | 13 days supply | Qty: 40 | Fill #3

## 2016-12-07 MED FILL — LABETALOL HCL 100 MG TABLET: 100 | 30 days supply | Qty: 60 | Fill #0

## 2016-12-08 ENCOUNTER — Other Ambulatory Visit: Payer: Self-pay | Admitting: *Deleted

## 2016-12-08 NOTE — Telephone Encounter (Signed)
Patient needs a refill on her zofran.

## 2016-12-10 ENCOUNTER — Inpatient Hospital Stay (HOSPITAL_COMMUNITY)
Admission: AD | Admit: 2016-12-10 | Discharge: 2016-12-10 | Disposition: A | Discharge status: Home / Self Care | Payer: Medicaid Other | Source: Ambulatory Visit | Attending: Obstetrics and Gynecology | Admitting: Obstetrics and Gynecology

## 2016-12-10 ENCOUNTER — Ambulatory Visit (INDEPENDENT_AMBULATORY_CARE_PROVIDER_SITE_OTHER): Payer: Medicaid Other | Admitting: Obstetrics and Gynecology

## 2016-12-10 ENCOUNTER — Encounter: Payer: Self-pay | Admitting: *Deleted

## 2016-12-10 ENCOUNTER — Ambulatory Visit (INDEPENDENT_AMBULATORY_CARE_PROVIDER_SITE_OTHER): Payer: Medicaid Other | Admitting: *Deleted

## 2016-12-10 ENCOUNTER — Ambulatory Visit: Payer: Self-pay

## 2016-12-10 ENCOUNTER — Encounter (HOSPITAL_COMMUNITY): Payer: Self-pay

## 2016-12-10 VITALS — BP 138/79 | HR 95 | Wt 260.0 lb

## 2016-12-10 DIAGNOSIS — O99013 Anemia complicating pregnancy, third trimester: Secondary | ICD-10-CM | POA: Diagnosis not present

## 2016-12-10 DIAGNOSIS — O10913 Unspecified pre-existing hypertension complicating pregnancy, third trimester: Secondary | ICD-10-CM | POA: Insufficient documentation

## 2016-12-10 DIAGNOSIS — O98713 Human immunodeficiency virus [HIV] disease complicating pregnancy, third trimester: Secondary | ICD-10-CM | POA: Diagnosis not present

## 2016-12-10 DIAGNOSIS — Z7982 Long term (current) use of aspirin: Secondary | ICD-10-CM | POA: Insufficient documentation

## 2016-12-10 DIAGNOSIS — O10919 Unspecified pre-existing hypertension complicating pregnancy, unspecified trimester: Secondary | ICD-10-CM

## 2016-12-10 DIAGNOSIS — I10 Essential (primary) hypertension: Secondary | ICD-10-CM

## 2016-12-10 DIAGNOSIS — Z21 Asymptomatic human immunodeficiency virus [HIV] infection status: Secondary | ICD-10-CM | POA: Insufficient documentation

## 2016-12-10 DIAGNOSIS — Z833 Family history of diabetes mellitus: Secondary | ICD-10-CM | POA: Diagnosis not present

## 2016-12-10 DIAGNOSIS — Z8249 Family history of ischemic heart disease and other diseases of the circulatory system: Secondary | ICD-10-CM | POA: Insufficient documentation

## 2016-12-10 DIAGNOSIS — D573 Sickle-cell trait: Secondary | ICD-10-CM | POA: Diagnosis not present

## 2016-12-10 DIAGNOSIS — Z9049 Acquired absence of other specified parts of digestive tract: Secondary | ICD-10-CM | POA: Insufficient documentation

## 2016-12-10 DIAGNOSIS — Z8379 Family history of other diseases of the digestive system: Secondary | ICD-10-CM | POA: Diagnosis not present

## 2016-12-10 DIAGNOSIS — Z87891 Personal history of nicotine dependence: Secondary | ICD-10-CM | POA: Insufficient documentation

## 2016-12-10 DIAGNOSIS — R51 Headache: Secondary | ICD-10-CM | POA: Diagnosis not present

## 2016-12-10 DIAGNOSIS — O0990 Supervision of high risk pregnancy, unspecified, unspecified trimester: Secondary | ICD-10-CM

## 2016-12-10 DIAGNOSIS — Z888 Allergy status to other drugs, medicaments and biological substances status: Secondary | ICD-10-CM | POA: Insufficient documentation

## 2016-12-10 DIAGNOSIS — Z79899 Other long term (current) drug therapy: Secondary | ICD-10-CM | POA: Insufficient documentation

## 2016-12-10 DIAGNOSIS — O26893 Other specified pregnancy related conditions, third trimester: Secondary | ICD-10-CM | POA: Insufficient documentation

## 2016-12-10 DIAGNOSIS — Z3A37 37 weeks gestation of pregnancy: Secondary | ICD-10-CM | POA: Diagnosis not present

## 2016-12-10 DIAGNOSIS — O98719 Human immunodeficiency virus [HIV] disease complicating pregnancy, unspecified trimester: Secondary | ICD-10-CM

## 2016-12-10 DIAGNOSIS — R519 Headache, unspecified: Secondary | ICD-10-CM

## 2016-12-10 LAB — CBC
HCT: 27.7 % — ABNORMAL LOW (ref 36.0–46.0)
HEMOGLOBIN: 9.3 g/dL — AB (ref 12.0–15.0)
MCH: 23.5 pg — ABNORMAL LOW (ref 26.0–34.0)
MCHC: 33.6 g/dL (ref 30.0–36.0)
MCV: 69.9 fL — ABNORMAL LOW (ref 78.0–100.0)
PLATELETS: 240 10*3/uL (ref 150–400)
RBC: 3.96 MIL/uL (ref 3.87–5.11)
RDW: 14.5 % (ref 11.5–15.5)
WBC: 6.2 10*3/uL (ref 4.0–10.5)

## 2016-12-10 LAB — COMPREHENSIVE METABOLIC PANEL
ALBUMIN: 2.6 g/dL — AB (ref 3.5–5.0)
ALT: 11 U/L — ABNORMAL LOW (ref 14–54)
AST: 11 U/L — AB (ref 15–41)
Alkaline Phosphatase: 132 U/L — ABNORMAL HIGH (ref 38–126)
Anion gap: 6 (ref 5–15)
BUN: 6 mg/dL (ref 6–20)
CHLORIDE: 109 mmol/L (ref 101–111)
CO2: 21 mmol/L — ABNORMAL LOW (ref 22–32)
Calcium: 8.3 mg/dL — ABNORMAL LOW (ref 8.9–10.3)
Creatinine, Ser: 0.48 mg/dL (ref 0.44–1.00)
GFR calc Af Amer: >60 mL/min (ref >60)
GLUCOSE: 90 mg/dL (ref 65–99)
POTASSIUM: 3.7 mmol/L (ref 3.5–5.1)
Sodium: 136 mmol/L (ref 135–145)
Total Bilirubin: 0.3 mg/dL (ref 0.3–1.2)
Total Protein: 6.6 g/dL (ref 6.5–8.1)

## 2016-12-10 LAB — URINALYSIS, ROUTINE W REFLEX MICROSCOPIC
Bilirubin Urine: NEGATIVE
Glucose, UA: NEGATIVE mg/dL
HGB URINE DIPSTICK: NEGATIVE
Ketones, ur: NEGATIVE mg/dL
LEUKOCYTES UA: NEGATIVE
Nitrite: NEGATIVE
Protein, ur: NEGATIVE mg/dL
SPECIFIC GRAVITY, URINE: 1.008 (ref 1.005–1.030)
pH: 5 (ref 5.0–8.0)

## 2016-12-10 LAB — PROTEIN / CREATININE RATIO, URINE
CREATININE, URINE: 56 mg/dL
Total Protein, Urine: <6 mg/dL

## 2016-12-10 IMAGING — US US FETAL BPP W/ NON-STRESS
1 series · 13 of 13 positions shown · non-contrast
Comparison: none

[Series 1: us fetal bpp w/nonstress · 13 acquisitions, 13 frames shown]
[im 1/13]
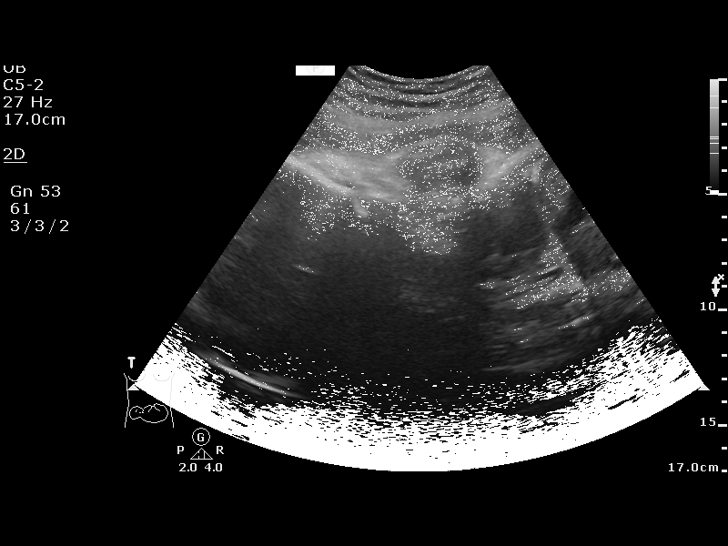
[im 2/13]
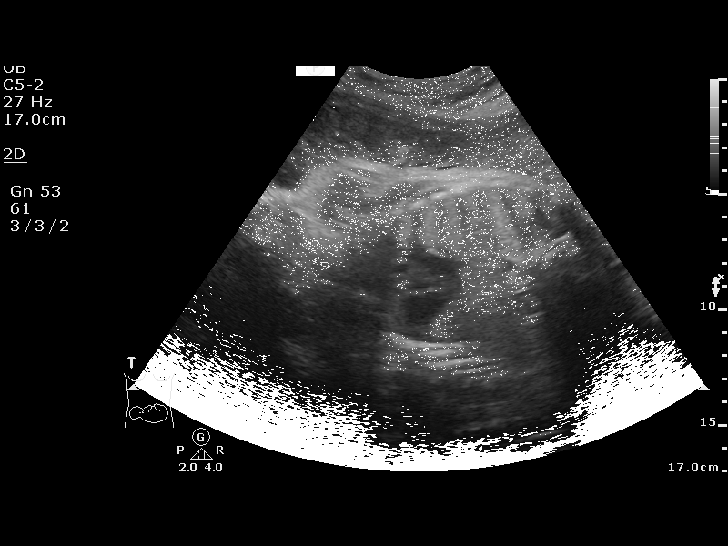
[im 3/13]
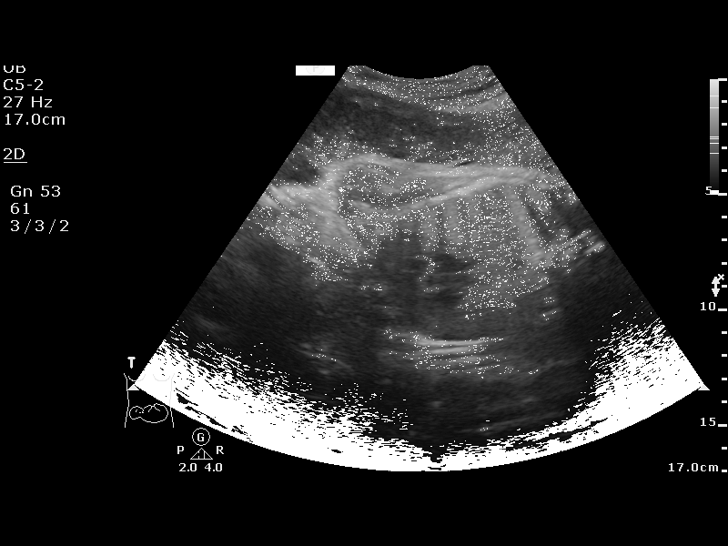
[im 4/13]
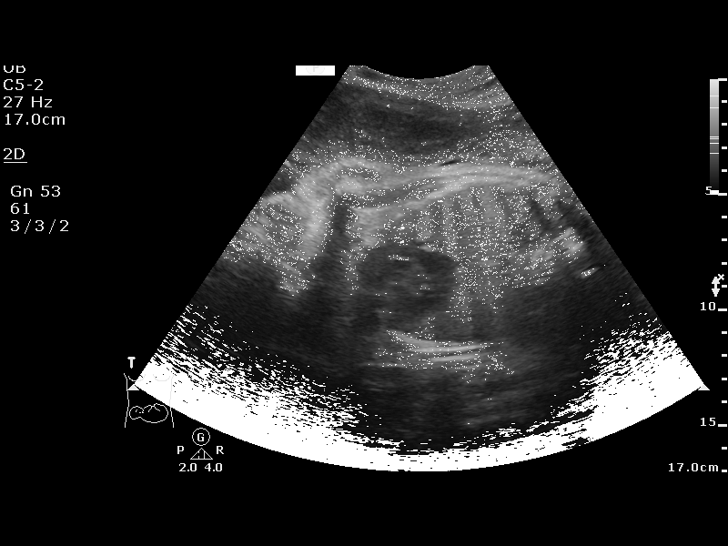
[im 5/13]
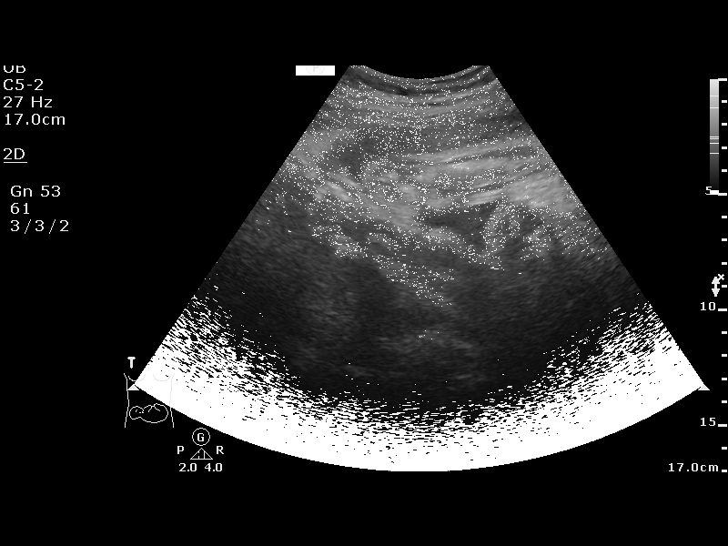
[im 6/13]
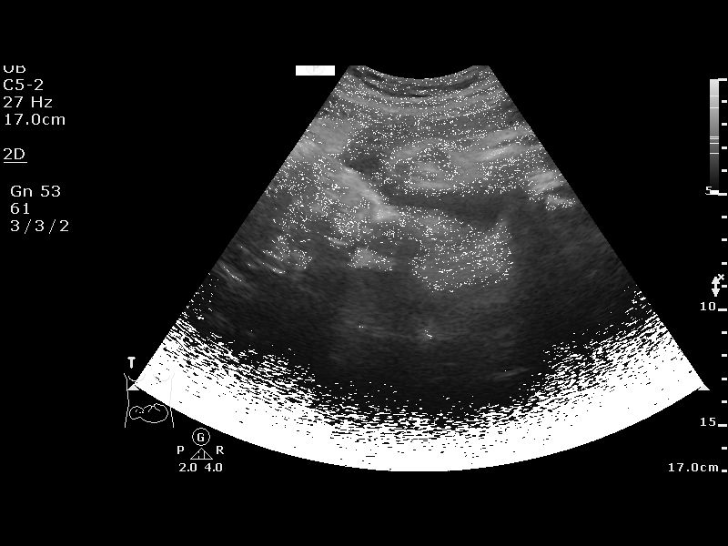
[im 7/13]
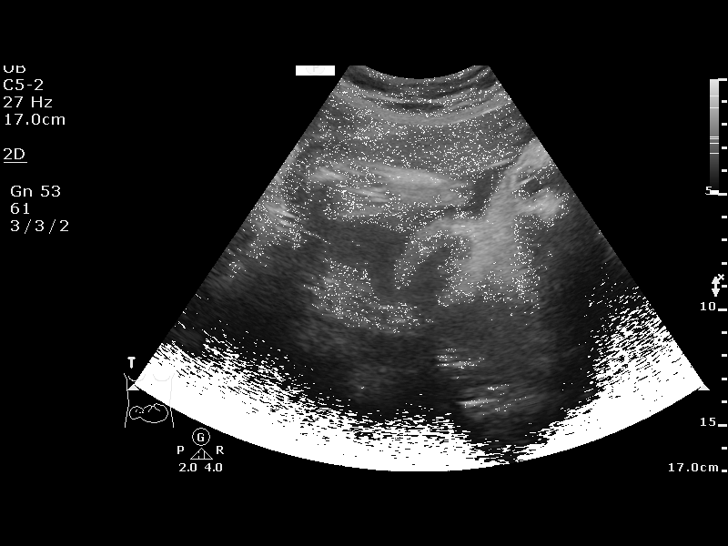
[im 8/13]
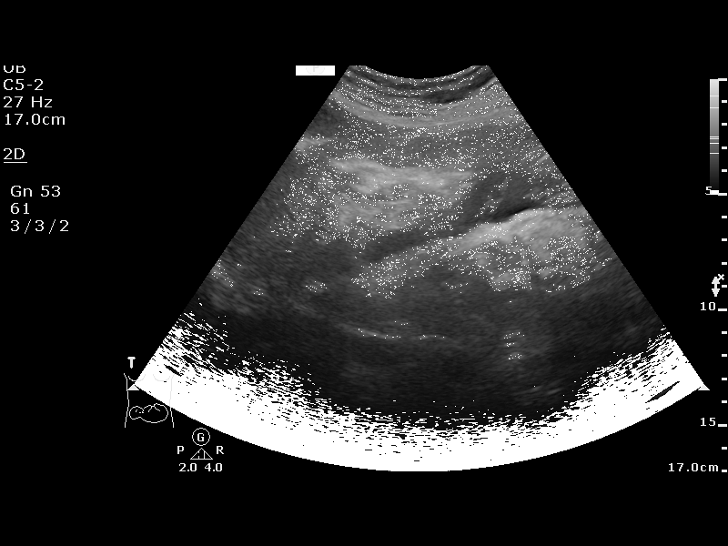
[im 9/13]
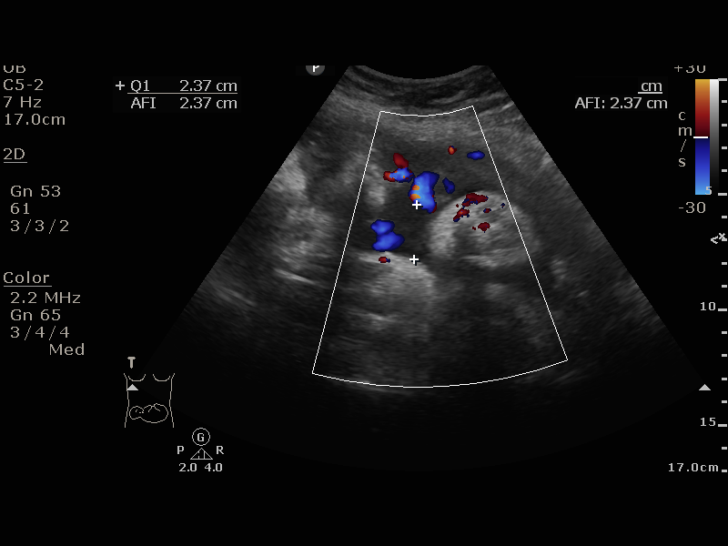
[im 10/13]
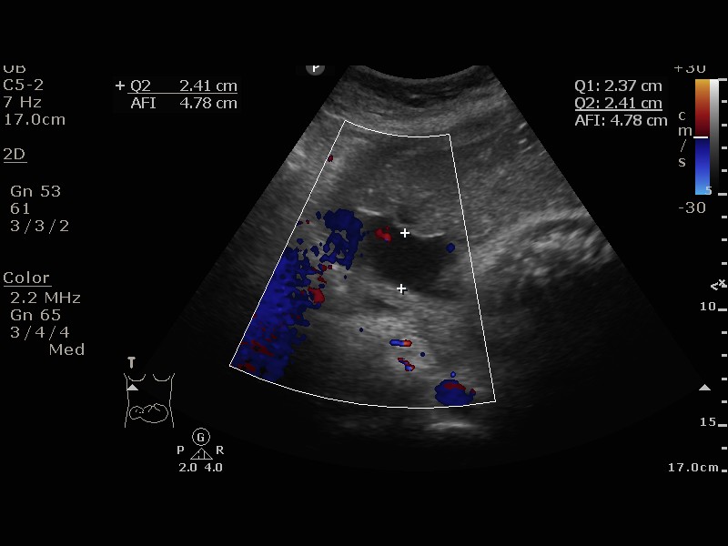
[im 11/13]
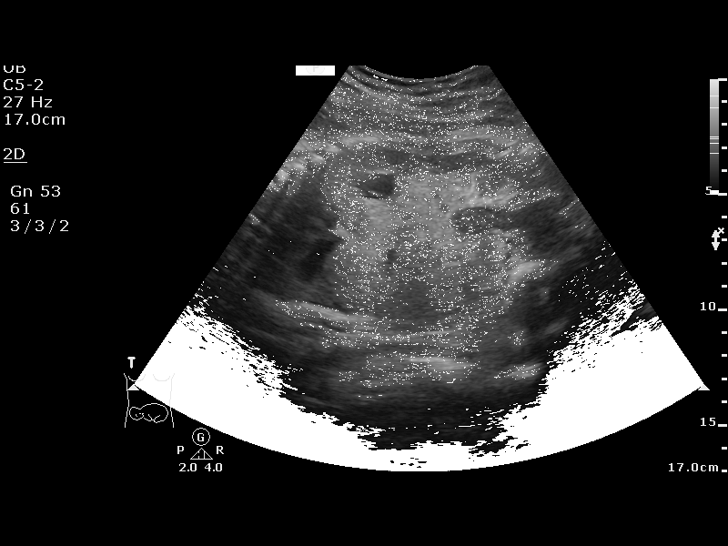
[im 12/13]
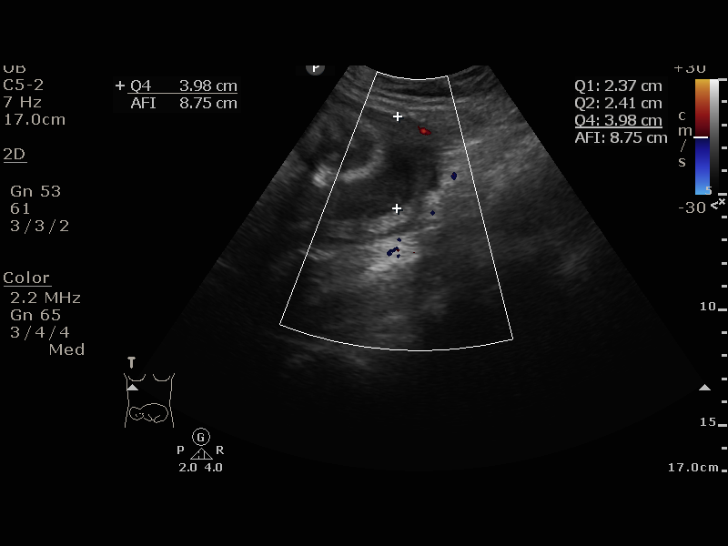
[im 13/13]
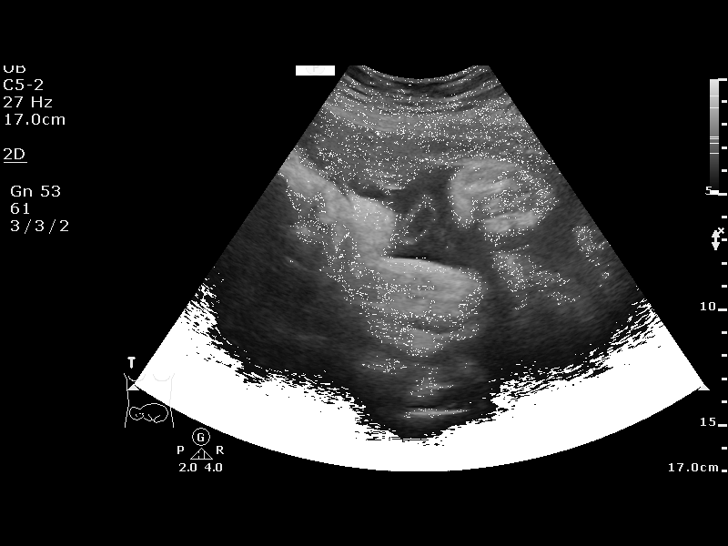

[13 of 13 positions shown; findings below may reference images not displayed]

[facility]                                  [REDACTED]
[facility]

1  US FETAL BPP W/NONSTRESS                    76818.4

1  [facility]IVIANA           [PHONE_NUMBER]      [PHONE_NUMBER]     [PHONE_NUMBER]
Service(s) Provided

Indications

37 weeks gestation of pregnancy
Unspecified pre-existing hypertension          [90]
complicating pregnancy, third trimester
OB History

Blood Type:            Height:  5'2"   Weight (lb):  238       BMI:
Gravidity:    5         Term:   4
Living:       4
Fetal Evaluation

Num Of Fetuses:     1
Preg. Location:     Intrauterine
Cardiac Activity:   Observed
Presentation:       Transverse, head to maternal right
Amniotic Fluid
AFI FV:      Subjectively within normal limits

AFI Sum(cm)     %Tile       Largest Pocket(cm)
8.76            15

RUQ(cm)       RLQ(cm)       LUQ(cm)
2.37
Biophysical Evaluation

Amniotic F.V:   Pocket => 2 cm two         F. Tone:        Observed
planes
F. Movement:    Observed                   N.S.T:          Reactive
F. Breathing:   Observed                   Score:          [DATE]
Gestational Age

LMP:           42w 3d        Date:  [DATE]                 EDD:   [DATE]
Best:          37w 2d     Det. By:  U/S  ([DATE])          EDD:   [DATE]
Impression

Viable fetus in transverse position
Recommendations

Patient scheduled for ECV tomorrow morning
Continue Antenatal testing

## 2016-12-10 MED ORDER — LABETALOL HCL 100 MG PO TABS
100.0000 mg | ORAL_TABLET | Freq: Once | ORAL | Status: AC
  Administered 2016-12-10: 100 mg via ORAL
  Filled 2016-12-10: qty 1

## 2016-12-10 MED ORDER — BUTALBITAL-APAP-CAFFEINE 50-325-40 MG PO TABS
2.0000 | ORAL_TABLET | Freq: Once | ORAL | Status: AC
  Administered 2016-12-10: 2 via ORAL
  Filled 2016-12-10: qty 2

## 2016-12-10 MED ORDER — LABETALOL HCL 100 MG PO TABS: 200.0000 mg | ORAL_TABLET | Freq: Two times a day (BID) | ORAL | 0 refills | Status: DC

## 2016-12-10 NOTE — Progress Notes (Signed)
Pt states she has daily H/A as well as blurry vision, dizziness and seeing spots. Pt also reports having bilateral hip pain which inhibits her physical mobility.

## 2016-12-10 NOTE — Discharge Instructions (Signed)
Preeclampsia and Eclampsia °Preeclampsia is a serious condition that develops only during pregnancy. It is also called toxemia of pregnancy. This condition causes high blood pressure along with other symptoms, such as swelling and headaches. These symptoms may develop as the condition gets worse. Preeclampsia may occur at 20 weeks of pregnancy or later. °Diagnosing and treating preeclampsia early is very important. If not treated early, it can cause serious problems for you and your baby. One problem it can lead to is eclampsia, which is a condition that causes muscle jerking or shaking (convulsions or seizures) in the mother. Delivering your baby is the best treatment for preeclampsia or eclampsia. Preeclampsia and eclampsia symptoms usually go away after your baby is born. °What are the causes? °The cause of preeclampsia is not known. °What increases the risk? °The following risk factors make you more likely to develop preeclampsia: °· Being pregnant for the first time. °· Having had preeclampsia during a past pregnancy. °· Having a family history of preeclampsia. °· Having high blood pressure. °· Being pregnant with twins or triplets. °· Being 35 or older. °· Being African-American. °· Having kidney disease or diabetes. °· Having medical conditions such as lupus or blood diseases. °· Being very overweight (obese). ° °What are the signs or symptoms? °The earliest signs of preeclampsia are: °· High blood pressure. °· Increased protein in your urine. Your health care provider will check for this at every visit before you give birth (prenatal visit). ° °Other symptoms that may develop as the condition gets worse include: °· Severe headaches. °· Sudden weight gain. °· Swelling of the hands, face, legs, and feet. °· Nausea and vomiting. °· Vision problems, such as blurred or double vision. °· Numbness in the face, arms, legs, and feet. °· Urinating less than usual. °· Dizziness. °· Slurred speech. °· Abdominal pain,  especially upper abdominal pain. °· Convulsions or seizures. ° °Symptoms generally go away after giving birth. °How is this diagnosed? °There are no screening tests for preeclampsia. Your health care provider will ask you about symptoms and check for signs of preeclampsia during your prenatal visits. You may also have tests that include: °· Urine tests. °· Blood tests. °· Checking your blood pressure. °· Monitoring your baby’s heart rate. °· Ultrasound. ° °How is this treated? °You and your health care provider will determine the treatment approach that is best for you. Treatment may include: °· Having more frequent prenatal exams to check for signs of preeclampsia, if you have an increased risk for preeclampsia. °· Bed rest. °· Reducing how much salt (sodium) you eat. °· Medicine to lower your blood pressure. °· Staying in the hospital, if your condition is severe. There, treatment will focus on controlling your blood pressure and the amount of fluids in your body (fluid retention). °· You may need to take medicine (magnesium sulfate) to prevent seizures. This medicine may be given as an injection or through an IV tube. °· Delivering your baby early, if your condition gets worse. You may have your labor started with medicine (induced), or you may have a cesarean delivery. ° °Follow these instructions at home: °Eating and drinking ° °· Drink enough fluid to keep your urine clear or pale yellow. °· Eat a healthy diet that is low in sodium. Do not add salt to your food. Check nutrition labels to see how much sodium a food or beverage contains. °· Avoid caffeine. °Lifestyle °· Do not use any products that contain nicotine or tobacco, such as cigarettes   and e-cigarettes. If you need help quitting, ask your health care provider. °· Do not use alcohol or drugs. °· Avoid stress as much as possible. Rest and get plenty of sleep. °General instructions °· Take over-the-counter and prescription medicines only as told by your  health care provider. °· When lying down, lie on your side. This keeps pressure off of your baby. °· When sitting or lying down, raise (elevate) your feet. Try putting some pillows underneath your lower legs. °· Exercise regularly. Ask your health care provider what kinds of exercise are best for you. °· Keep all follow-up and prenatal visits as told by your health care provider. This is important. °How is this prevented? °To prevent preeclampsia or eclampsia from developing during another pregnancy: °· Get proper medical care during pregnancy. Your health care provider may be able to prevent preeclampsia or diagnose and treat it early. °· Your health care provider may have you take a low-dose aspirin or a calcium supplement during your next pregnancy. °· You may have tests of your blood pressure and kidney function after giving birth. °· Maintain a healthy weight. Ask your health care provider for help managing weight gain during pregnancy. °· Work with your health care provider to manage any long-term (chronic) health conditions you have, such as diabetes or kidney problems. ° °Contact a health care provider if: °· You gain more weight than expected. °· You have headaches. °· You have nausea or vomiting. °· You have abdominal pain. °· You feel dizzy or light-headed. °Get help right away if: °· You develop sudden or severe swelling anywhere in your body. This usually happens in the legs. °· You gain 5 lbs (2.3 kg) or more during one week. °· You have severe: °? Abdominal pain. °? Headaches. °? Dizziness. °? Vision problems. °? Confusion. °? Nausea or vomiting. °· You have a seizure. °· You have trouble moving any part of your body. °· You develop numbness in any part of your body. °· You have trouble speaking. °· You have any abnormal bleeding. °· You pass out. °This information is not intended to replace advice given to you by your health care provider. Make sure you discuss any questions you have with your health  care provider. °Document Released: 01/24/2000 Document Revised: 09/24/2015 Document Reviewed: 09/02/2015 °Elsevier Interactive Patient Education © 2018 [employer] ° °

## 2016-12-10 NOTE — MAU Provider Note (Signed)
History     CSN: [id number]  Arrival date and time: 12/10/16 1204   First Provider Initiated Contact with Patient 12/10/16 1317      Chief Complaint  Patient presents with  . Hypertension   HPI Catherine Simmons is a 31 y.o. G5P4004 at [redacted]w[redacted]d who presents from the clinic for evaluation for pre-eclampsia. She reports a headache, blurred vision and right upper epigastric pain for 2 weeks that has worsened today. She has chronic hypertension and takes labetalol. She denies any vaginal bleeding, leaking of fluid or contractions. Reports good fetal movement.   OB History    Gravida Para Term Preterm AB Living   5 4 4  0   4   SAB TAB Ectopic Multiple Live Births         0 4      Past Medical History:  Diagnosis Date  . Anemia   . Cholecystitis 07/16/10  . Genital warts 2004  . Hemorrhoids   . HIV (human immunodeficiency virus infection) (HCC)   . Hx of pelvic inflammatory disease 08/31/2013   And hx of multiple STDs   . Hypertension   . Pregnancy induced hypertension   . Sickle cell trait [facility])     Past Surgical History:  Procedure Laterality Date  . CHOLECYSTECTOMY    . CHOLECYSTECTOMY  07/19/2010  . WISDOM TOOTH EXTRACTION      Family History  Problem Relation Age of Onset  . Cancer Mother   . Hypertension Mother   . Diabetes Mother   . Pancreatitis Mother   . Hypertension Father   . Diabetes Maternal Aunt     Social History  Substance Use Topics  . Smoking status: Former Smoker    Packs/day: 0.30    Types: Cigarettes    Start date: 03/11/2012    Quit date: 12/11/2015  . Smokeless tobacco: Never Used  . Alcohol use No     Comment: occ    Allergies:  Allergies  Allergen Reactions  . Stadol [Butorphanol Tartrate] Itching    Tolerates percocet    Prescriptions Prior to Admission  Medication Sig Dispense Refill Last Dose  . acetaminophen (TYLENOL) 500 MG tablet Take 1,000 mg by mouth every 6 (six) hours as needed for mild pain, moderate pain or  headache.    Taking  . aspirin 81 MG chewable tablet Chew 1 tablet (81 mg total) by mouth daily. 60 tablet 3 Taking  . docusate sodium (COLACE) 100 MG capsule Take 1 capsule (100 mg total) by mouth 2 (two) times daily as needed. (Patient taking differently: Take 100 mg by mouth 2 (two) times daily as needed for mild constipation. ) 60 capsule 3 Taking  . emtricitabine-tenofovir (TRUVADA) 200-300 MG tablet Take 1 tablet by mouth daily. 30 tablet 11 Taking  . labetalol (NORMODYNE) 100 MG tablet TAKE 1 TABLET BY MOUTH 2 TIMES DAILY. 60 tablet 0 Taking  . ondansetron (ZOFRAN ODT) 8 MG disintegrating tablet Take 1 tablet (8 mg total) by mouth every 8 (eight) hours as needed for nausea or vomiting. 40 tablet 4 Taking  . Prenatal Vit-Fe Fumarate-FA (PRENATAL VITAMIN) 27-0.8 MG TABS Take 1 tablet by mouth daily. (Patient not taking: Reported on 12/10/2016) 30 tablet 6 Not Taking  . Raltegravir Potassium (ISENTRESS HD) 600 MG TABS Take 2 tablets by mouth daily. 60 tablet 11 Taking    Review of Systems  Constitutional: Negative.  Negative for fatigue and fever.  HENT: Negative.   Eyes: Positive for visual disturbance.  Respiratory:  Negative.  Negative for shortness of breath.   Cardiovascular: Negative.  Negative for chest pain.  Gastrointestinal: Positive for abdominal pain. Negative for constipation, diarrhea, nausea and vomiting.  Genitourinary: Negative.  Negative for dysuria.  Neurological: Positive for headaches. Negative for dizziness.   Physical Exam   Blood pressure (!) 143/88, pulse 92, temperature 97.6 F (36.4 C), temperature source Oral, resp. rate 18, height 5\' 2"  (1.575 m), weight 260 lb (117.9 kg), last menstrual period 02/17/2016, SpO2 100 %, unknown if currently breastfeeding.  Physical Exam  Nursing note and vitals reviewed. Constitutional: She is oriented to person, place, and time. She appears well-developed and well-nourished. No distress.  HENT:  Head: Normocephalic.  Eyes:  Pupils are equal, round, and reactive to light.  Cardiovascular: Normal rate, regular rhythm and normal heart sounds.   Respiratory: Effort normal and breath sounds normal. No respiratory distress.  GI: Soft. Bowel sounds are normal. She exhibits no distension. There is no tenderness.  Neurological: She is alert and oriented to person, place, and time. She displays normal reflexes.  Skin: Skin is warm and dry.  Psychiatric: She has a normal mood and affect. Her behavior is normal. Judgment and thought content normal.   Fetal Tracing:  Baseline: 120 bpm Variability: moderate Accels: 15x15 Decels: none  Toco: none  Date/Time  Temp  Pulse  ECG Heart Rate  Resp  BP  Patient Position (if appropriate)  SpO2  O2 Device  Weight Who   12/10/16 1447  --  71  --  --  130/82  --  --  --  -- CF   12/10/16 1432  --  75  --  --  129/85  --  --  --  -- CF   12/10/16 1417  --  76  --  --   142/76  --  --  --  -- CF   12/10/16 1402  --  79  --  --  132/80  --  --  --  -- CF   12/10/16 1347  --  86  --  --   142/87  --  --  --  -- CF   12/10/16 1332  --  83  --  --   147/89  --  --  --  -- CF   12/10/16 1324  --  83  --  --  139/85  --  --  --  -- CF   12/10/16 1244  97.6 F (36.4 C)  92  --  18   143/88  High-fowlers  100 %  Room Air  260 lb (117.9 kg) CF   12/10/16 1230  98 F (36.7 C)  79  --  18   173/101  --  --  --  -- GM     MAU Course  Procedures Results for orders placed or performed during the hospital encounter of 12/10/16 (from the past 24 hour(s))  Urinalysis, Routine w reflex microscopic     Status: None   Collection Time: 12/10/16 12:30 PM  Result Value Ref Range   Color, Urine YELLOW YELLOW   APPearance CLEAR CLEAR   Specific Gravity, Urine 1.008 1.005 - 1.030   pH 5.0 5.0 - 8.0   Glucose, UA NEGATIVE NEGATIVE mg/dL   Hgb urine dipstick NEGATIVE NEGATIVE   Bilirubin Urine NEGATIVE NEGATIVE   Ketones, ur NEGATIVE NEGATIVE mg/dL   Protein, ur NEGATIVE NEGATIVE mg/dL   Nitrite  NEGATIVE NEGATIVE   Leukocytes, UA NEGATIVE NEGATIVE  Protein / creatinine  ratio, urine     Status: None   Collection Time: 12/10/16 12:30 PM  Result Value Ref Range   Creatinine, Urine 56.00 mg/dL   Total Protein, Urine <6 mg/dL   Protein Creatinine Ratio        0.00 - 0.15 mg/mg[Cre]  CBC     Status: Abnormal   Collection Time: 12/10/16  1:15 PM  Result Value Ref Range   WBC 6.2 4.0 - 10.5 K/uL   RBC 3.96 3.87 - 5.11 MIL/uL   Hemoglobin 9.3 (L) 12.0 - 15.0 g/dL   HCT 52.827.7 (L) 41.336.0 - 24.446.0 %   MCV 69.9 (L) 78.0 - 100.0 fL   MCH 23.5 (L) 26.0 - 34.0 pg   MCHC 33.6 30.0 - 36.0 g/dL   RDW 01.014.5 27.211.5 - 53.615.5 %   Platelets 240 150 - 400 K/uL  Comprehensive metabolic panel     Status: Abnormal   Collection Time: 12/10/16  1:15 PM  Result Value Ref Range   Sodium 136 135 - 145 mmol/L   Potassium 3.7 3.5 - 5.1 mmol/L   Chloride 109 101 - 111 mmol/L   CO2 21 (L) 22 - 32 mmol/L   Glucose, Bld 90 65 - 99 mg/dL   BUN 6 6 - 20 mg/dL   Creatinine, Ser 6.440.48 0.44 - 1.00 mg/dL   Calcium 8.3 (L) 8.9 - 10.3 mg/dL   Total Protein 6.6 6.5 - 8.1 g/dL   Albumin 2.6 (L) 3.5 - 5.0 g/dL   AST 11 (L) 15 - 41 U/L   ALT 11 (L) 14 - 54 U/L   Alkaline Phosphatase 132 (H) 38 - 126 U/L   Total Bilirubin 0.3 0.3 - 1.2 mg/dL   GFR calc non Af Amer >60 >60 mL/min   GFR calc Af Amer >60 >60 mL/min   Anion gap 6 5 - 15   MDM UA CBC, CMP, protein/creat ratio NST Discussed with Dr. Debroah LoopArnold- ok to discharge patient home, increase labetalol to 200mg  BID  Assessment and Plan   1. Chronic hypertension during pregnancy, antepartum   2. [redacted] weeks gestation of pregnancy   3. Headache in pregnancy, antepartum, third trimester    -Discharge home in stable condition -Increase labetalol to 200mg  BID -Preeclampsia precautions discussed -[facility] -Patient may return to MAU as needed or if her condition were to change or worsen  Rolm BookbinderCaroline M Liddie Chichester  CNM 12/10/2016, 1:29 PM

## 2016-12-10 NOTE — MAU Note (Signed)
Pt sent up from clinic for PRE-E labs. Reports headache and blurred vision
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
## 2016-12-10 NOTE — Progress Notes (Signed)

## 2016-12-11 ENCOUNTER — Observation Stay (HOSPITAL_COMMUNITY)
Admission: RE | Admit: 2016-12-11 | Discharge: 2016-12-11 | Disposition: A | Discharge status: Home / Self Care | Payer: Medicaid Other | Source: Ambulatory Visit | Attending: Obstetrics and Gynecology | Admitting: Obstetrics and Gynecology

## 2016-12-11 ENCOUNTER — Encounter (HOSPITAL_COMMUNITY): Payer: Self-pay

## 2016-12-11 DIAGNOSIS — Z79899 Other long term (current) drug therapy: Secondary | ICD-10-CM | POA: Diagnosis not present

## 2016-12-11 DIAGNOSIS — Z87891 Personal history of nicotine dependence: Secondary | ICD-10-CM | POA: Insufficient documentation

## 2016-12-11 DIAGNOSIS — O0990 Supervision of high risk pregnancy, unspecified, unspecified trimester: Secondary | ICD-10-CM

## 2016-12-11 DIAGNOSIS — Z3A27 27 weeks gestation of pregnancy: Secondary | ICD-10-CM | POA: Diagnosis not present

## 2016-12-11 DIAGNOSIS — Z7982 Long term (current) use of aspirin: Secondary | ICD-10-CM | POA: Insufficient documentation

## 2016-12-11 DIAGNOSIS — Z3A37 37 weeks gestation of pregnancy: Secondary | ICD-10-CM | POA: Diagnosis not present

## 2016-12-11 DIAGNOSIS — O0993 Supervision of high risk pregnancy, unspecified, third trimester: Secondary | ICD-10-CM | POA: Diagnosis not present

## 2016-12-11 DIAGNOSIS — O321XX0 Maternal care for breech presentation, not applicable or unspecified: Principal | ICD-10-CM

## 2016-12-11 HISTORY — PX: EXTERNAL CEPHALIC VERSION: OBO2

## 2016-12-11 LAB — CBC
HCT: 26.6 % — ABNORMAL LOW (ref 36.0–46.0)
HEMOGLOBIN: 9 g/dL — AB (ref 12.0–15.0)
MCH: 23.6 pg — ABNORMAL LOW (ref 26.0–34.0)
MCHC: 33.8 g/dL (ref 30.0–36.0)
MCV: 69.8 fL — ABNORMAL LOW (ref 78.0–100.0)
Platelets: 222 10*3/uL (ref 150–400)
RBC: 3.81 MIL/uL — AB (ref 3.87–5.11)
RDW: 14.4 % (ref 11.5–15.5)
WBC: 6.7 10*3/uL (ref 4.0–10.5)

## 2016-12-11 LAB — RPR: RPR: NONREACTIVE

## 2016-12-11 LAB — TYPE AND SCREEN
ABO/RH(D): A POS
ANTIBODY SCREEN: NEGATIVE

## 2016-12-11 MED ORDER — TERBUTALINE SULFATE 1 MG/ML IJ SOLN
0.2500 mg | Freq: Once | INTRAMUSCULAR | Status: AC
  Administered 2016-12-11: 0.25 mg via SUBCUTANEOUS

## 2016-12-11 MED ORDER — TERBUTALINE SULFATE 1 MG/ML IJ SOLN
INTRAMUSCULAR | Status: AC
  Filled 2016-12-11: qty 1

## 2016-12-11 NOTE — Procedures (Signed)
After informed verbal consent, Terbutaline 0.25 mg SQ given, ECV was attempted under Ultrasound guidance. Ultrasound showed successful ECV, fetal lie now cepahlic.  FHR was reactive before and after the procedure.   Pt. Tolerated the procedure well. Will monitor patient and EFM for at least 30 minutes.   Dr. Earlene Plateravis and Dr. Debroah LoopArnold were present for entire procedure.  Rolm BookbinderAmber Lawonda Pretlow, DO

## 2016-12-11 NOTE — Progress Notes (Signed)
Faculty Attending Note  Patient is s/p successful external cephalic version. She is not feeling any contractions, denies vaginal bleeding or leaking. She reports she is feeling baby move normally. Denies other complaints.  Today's Vitals   12/11/16 0854 12/11/16 1208  BP: 138/87   Pulse: 90   Resp: 16 16  Temp: 97.9 F (36.6 C) 98.2 F (36.8 C)  TempSrc: Oral Oral    Gen: alert, oriented, well nourished female in no apparent distress CVS: regular rate Resp: no issues with breathing noted Abd: soft, gravid  NST: reactive Toco: no contractions  A/P: 31 yo G5P4004 @ 6536w3d with h/o cHTN and HIV s/p successful ECV today. Baby has been monitored for 1.5 hrs with reactive NST. She is feeling baby move and is without pain, denies contractions. BP mildly elevated but she is known cHTN on labetalol which was increased to 200 mg BID yesterday. Discharged to home.  To f/u in office with regular visit next week Reviewed labor precautions, she verbalizes understanding to go to MAU with any bleeding, leaking, regular painful contractions or decreased fetal movement.     Baldemar LenisK. Meryl Seamus Warehime, M.D. Attending Obstetrician & Gynecologist, [facility], [facility]

## 2016-12-11 NOTE — Discharge Summary (Signed)
   Physician Discharge Summary  Patient ID: Catherine MccallumMarkita L Simmons MRN: [id number] DOB/AGE: 31/02/1985 31 y.o.  Admit date: 12/11/2016 Discharge date: 12/11/2016   Discharge Diagnoses:  Active Problems:   Supervision of high risk pregnancy, antepartum   Breech presentation    Hospital Course: Please see HPI dated 12/11/2016 for details. This is a 31 y.o. [id number] who @ 4054w3d who presents for external cephalic version. MHx significant for cHTN, well controlled HIV. She underwent a successful ECV and was monitored for 1.5 hours after with reactive NST. Patient endorsed no pain. She was discharged home in good condition, to keep regularly scheduled appointments. Instructions for follow up given.    Physical exam  Vitals:   12/11/16 0854 12/11/16 1208  BP: 138/87   Pulse: 90   Resp: 16 16  Temp: 97.9 F (36.6 C) 98.2 F (36.8 C)  TempSrc: Oral Oral   Labs: Lab Results  Component Value Date   WBC 6.7 12/11/2016   HGB 9.0 (L) 12/11/2016   HCT 26.6 (L) 12/11/2016   MCV 69.8 (L) 12/11/2016   PLT 222 12/11/2016   CMP Latest Ref Rng & Units 12/10/2016  Glucose 65 - 99 mg/dL 90  BUN 6 - 20 mg/dL 6  Creatinine 5.280.44 - 4.131.00 mg/dL 2.440.48  Sodium 010135 - 272145 mmol/L 136  Potassium 3.5 - 5.1 mmol/L 3.7  Chloride 101 - 111 mmol/L 109  CO2 22 - 32 mmol/L 21(L)  Calcium 8.9 - 10.3 mg/dL 8.3(L)  Total Protein 6.5 - 8.1 g/dL 6.6  Total Bilirubin 0.3 - 1.2 mg/dL 0.3  Alkaline Phos 38 - 126 U/L 132(H)  AST 15 - 41 U/L 11(L)  ALT 14 - 54 U/L 11(L)    Disposition: 01-Home or Self Care  Discharged Condition: good  Discharge Instructions    Diet - low sodium heart healthy    Complete by:  As directed    Increase activity slowly    Complete by:  As directed      Allergies as of 12/11/2016      Reactions   Stadol [butorphanol Tartrate] Itching   Tolerates percocet      Medication List    TAKE these medications   acetaminophen 500 MG tablet Commonly known as:  TYLENOL Take 1,000 mg by  mouth every 6 (six) hours as needed for mild pain, moderate pain or headache.   aspirin 81 MG chewable tablet Chew 1 tablet (81 mg total) by mouth daily.   docusate sodium 100 MG capsule Commonly known as:  COLACE Take 1 capsule (100 mg total) by mouth 2 (two) times daily as needed. What changed:  reasons to take this   emtricitabine-tenofovir 200-300 MG tablet Commonly known as:  TRUVADA Take 1 tablet by mouth daily.   labetalol 100 MG tablet Commonly known as:  NORMODYNE Take 2 tablets (200 mg total) by mouth 2 (two) times daily.   ondansetron 8 MG disintegrating tablet Commonly known as:  ZOFRAN ODT Take 1 tablet (8 mg total) by mouth every 8 (eight) hours as needed for nausea or vomiting.   Prenatal Vitamin 27-0.8 MG Tabs Take 1 tablet by mouth daily.   Raltegravir Potassium 600 MG Tabs Commonly known as:  ISENTRESS HD Take 2 tablets by mouth daily.        Signed: Conan BowensKelly M Davis 12/11/2016, 10:15 PM
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
## 2016-12-14 ENCOUNTER — Ambulatory Visit: Payer: Self-pay | Admitting: Internal Medicine

## 2016-12-16 ENCOUNTER — Telehealth: Payer: Self-pay | Admitting: Pharmacist Clinician (PhC)/ Clinical Pharmacy Specialist

## 2016-12-16 NOTE — Telephone Encounter (Signed)
She missed the appt the other day with Dr. Drue SecondSnider. She a planned delivery on 11/20. Therefore, we will need to repeat her HIV VL to confirm suppression. She will come in tomorrow after the OB visit.

## 2016-12-17 ENCOUNTER — Ambulatory Visit: Payer: Self-pay

## 2016-12-17 ENCOUNTER — Ambulatory Visit (INDEPENDENT_AMBULATORY_CARE_PROVIDER_SITE_OTHER): Payer: Medicaid Other | Admitting: Pharmacist

## 2016-12-17 ENCOUNTER — Ambulatory Visit (INDEPENDENT_AMBULATORY_CARE_PROVIDER_SITE_OTHER): Payer: Medicaid Other | Admitting: *Deleted

## 2016-12-17 ENCOUNTER — Encounter: Payer: Self-pay | Admitting: Obstetrics and Gynecology

## 2016-12-17 ENCOUNTER — Encounter (HOSPITAL_COMMUNITY): Payer: Self-pay | Admitting: Anesthesiology

## 2016-12-17 ENCOUNTER — Inpatient Hospital Stay (HOSPITAL_COMMUNITY)
Admission: AD | Admit: 2016-12-17 | Discharge: 2016-12-20 | DRG: 806 | Disposition: A | Discharge status: Home / Self Care | Payer: Medicaid Other | Source: Ambulatory Visit | Attending: Obstetrics and Gynecology | Admitting: Obstetrics and Gynecology

## 2016-12-17 ENCOUNTER — Encounter (HOSPITAL_COMMUNITY): Payer: Self-pay | Admitting: *Deleted

## 2016-12-17 ENCOUNTER — Ambulatory Visit (INDEPENDENT_AMBULATORY_CARE_PROVIDER_SITE_OTHER): Payer: Medicaid Other | Admitting: Obstetrics and Gynecology

## 2016-12-17 VITALS — BP 145/96 | HR 88

## 2016-12-17 VITALS — BP 147/91 | HR 87 | Wt 261.0 lb

## 2016-12-17 DIAGNOSIS — O1490 Unspecified pre-eclampsia, unspecified trimester: Secondary | ICD-10-CM | POA: Diagnosis present

## 2016-12-17 DIAGNOSIS — Z7982 Long term (current) use of aspirin: Secondary | ICD-10-CM | POA: Diagnosis not present

## 2016-12-17 DIAGNOSIS — O10913 Unspecified pre-existing hypertension complicating pregnancy, third trimester: Secondary | ICD-10-CM

## 2016-12-17 DIAGNOSIS — Z21 Asymptomatic human immunodeficiency virus [HIV] infection status: Secondary | ICD-10-CM | POA: Diagnosis present

## 2016-12-17 DIAGNOSIS — O0990 Supervision of high risk pregnancy, unspecified, unspecified trimester: Secondary | ICD-10-CM

## 2016-12-17 DIAGNOSIS — Z23 Encounter for immunization: Secondary | ICD-10-CM

## 2016-12-17 DIAGNOSIS — Z3A38 38 weeks gestation of pregnancy: Secondary | ICD-10-CM

## 2016-12-17 DIAGNOSIS — Z3009 Encounter for other general counseling and advice on contraception: Secondary | ICD-10-CM

## 2016-12-17 DIAGNOSIS — O114 Pre-existing hypertension with pre-eclampsia, complicating childbirth: Secondary | ICD-10-CM | POA: Diagnosis present

## 2016-12-17 DIAGNOSIS — O98719 Human immunodeficiency virus [HIV] disease complicating pregnancy, unspecified trimester: Secondary | ICD-10-CM

## 2016-12-17 DIAGNOSIS — B20 Human immunodeficiency virus [HIV] disease: Secondary | ICD-10-CM

## 2016-12-17 DIAGNOSIS — O9872 Human immunodeficiency virus [HIV] disease complicating childbirth: Secondary | ICD-10-CM | POA: Diagnosis present

## 2016-12-17 DIAGNOSIS — D573 Sickle-cell trait: Secondary | ICD-10-CM | POA: Diagnosis present

## 2016-12-17 DIAGNOSIS — O1002 Pre-existing essential hypertension complicating childbirth: Secondary | ICD-10-CM | POA: Diagnosis present

## 2016-12-17 DIAGNOSIS — O99340 Other mental disorders complicating pregnancy, unspecified trimester: Secondary | ICD-10-CM

## 2016-12-17 DIAGNOSIS — R030 Elevated blood-pressure reading, without diagnosis of hypertension: Secondary | ICD-10-CM | POA: Diagnosis present

## 2016-12-17 DIAGNOSIS — I10 Essential (primary) hypertension: Secondary | ICD-10-CM

## 2016-12-17 DIAGNOSIS — Z87891 Personal history of nicotine dependence: Secondary | ICD-10-CM | POA: Diagnosis not present

## 2016-12-17 DIAGNOSIS — O9902 Anemia complicating childbirth: Secondary | ICD-10-CM | POA: Diagnosis present

## 2016-12-17 DIAGNOSIS — O1092 Unspecified pre-existing hypertension complicating childbirth: Secondary | ICD-10-CM | POA: Diagnosis not present

## 2016-12-17 DIAGNOSIS — O119 Pre-existing hypertension with pre-eclampsia, unspecified trimester: Secondary | ICD-10-CM

## 2016-12-17 DIAGNOSIS — F329 Major depressive disorder, single episode, unspecified: Secondary | ICD-10-CM

## 2016-12-17 DIAGNOSIS — O321XX0 Maternal care for breech presentation, not applicable or unspecified: Secondary | ICD-10-CM

## 2016-12-17 LAB — URINALYSIS, MICROSCOPIC (REFLEX)

## 2016-12-17 LAB — URINALYSIS, ROUTINE W REFLEX MICROSCOPIC
BILIRUBIN URINE: NEGATIVE
GLUCOSE, UA: NEGATIVE mg/dL
HGB URINE DIPSTICK: NEGATIVE
KETONES UR: NEGATIVE mg/dL
Nitrite: NEGATIVE
PH: 5.5 (ref 5.0–8.0)
PROTEIN: NEGATIVE mg/dL
Specific Gravity, Urine: <1.005 — ABNORMAL LOW (ref 1.005–1.030)

## 2016-12-17 LAB — COMPREHENSIVE METABOLIC PANEL
ALBUMIN: 2.6 g/dL — AB (ref 3.5–5.0)
ALK PHOS: 141 U/L — AB (ref 38–126)
ALT: 9 U/L — ABNORMAL LOW (ref 14–54)
ANION GAP: 9 (ref 5–15)
AST: 13 U/L — ABNORMAL LOW (ref 15–41)
BILIRUBIN TOTAL: 0.3 mg/dL (ref 0.3–1.2)
BUN: 7 mg/dL (ref 6–20)
CALCIUM: 8.6 mg/dL — AB (ref 8.9–10.3)
CO2: 20 mmol/L — ABNORMAL LOW (ref 22–32)
Chloride: 107 mmol/L (ref 101–111)
Creatinine, Ser: 0.51 mg/dL (ref 0.44–1.00)
GLUCOSE: 98 mg/dL (ref 65–99)
POTASSIUM: 3.6 mmol/L (ref 3.5–5.1)
Sodium: 136 mmol/L (ref 135–145)
TOTAL PROTEIN: 6.5 g/dL (ref 6.5–8.1)

## 2016-12-17 LAB — POCT URINALYSIS DIP (DEVICE)
Bilirubin Urine: NEGATIVE
Glucose, UA: NEGATIVE mg/dL
HGB URINE DIPSTICK: NEGATIVE
Ketones, ur: NEGATIVE mg/dL
NITRITE: NEGATIVE
PROTEIN: NEGATIVE mg/dL
UROBILINOGEN UA: 0.2 mg/dL (ref 0.0–1.0)
pH: 5 (ref 5.0–8.0)

## 2016-12-17 LAB — CBC
HEMATOCRIT: 27.8 % — AB (ref 36.0–46.0)
HEMOGLOBIN: 9.2 g/dL — AB (ref 12.0–15.0)
MCH: 23.1 pg — AB (ref 26.0–34.0)
MCHC: 33.1 g/dL (ref 30.0–36.0)
MCV: 69.7 fL — AB (ref 78.0–100.0)
Platelets: 251 10*3/uL (ref 150–400)
RBC: 3.99 MIL/uL (ref 3.87–5.11)
RDW: 14.6 % (ref 11.5–15.5)
WBC: 5.7 10*3/uL (ref 4.0–10.5)

## 2016-12-17 LAB — PROTEIN / CREATININE RATIO, URINE
Creatinine, Urine: 55 mg/dL
Total Protein, Urine: <6 mg/dL

## 2016-12-17 LAB — TYPE AND SCREEN
ABO/RH(D): A POS
Antibody Screen: NEGATIVE

## 2016-12-17 IMAGING — US US FETAL BPP W/ NON-STRESS
1 series · 13 of 13 positions shown · non-contrast
Comparison: none

[Series 1: us fetal bpp w/nonstress · 13 acquisitions, 13 frames shown]
[im 1/13]
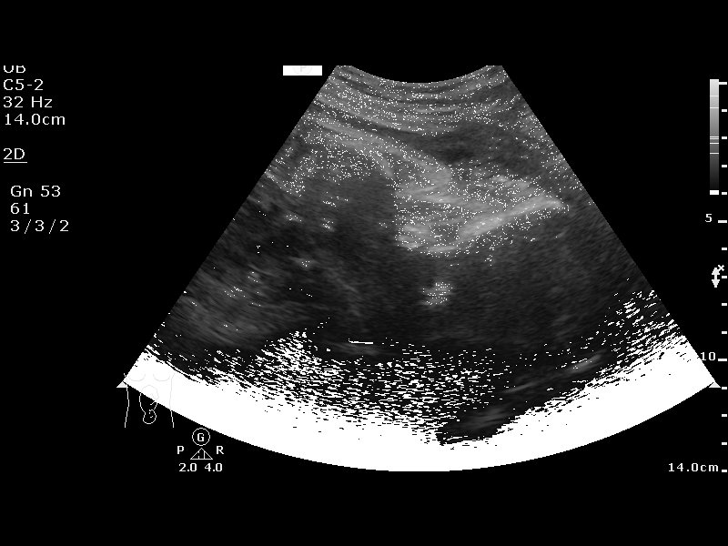
[im 2/13]
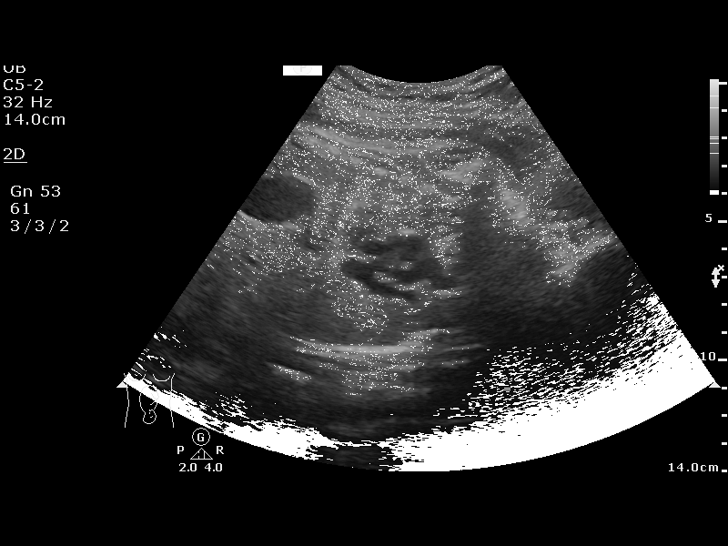
[im 3/13]
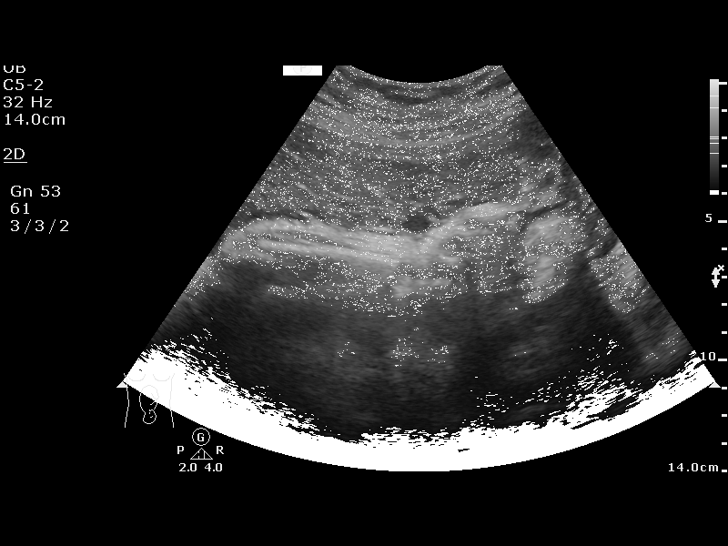
[im 4/13]
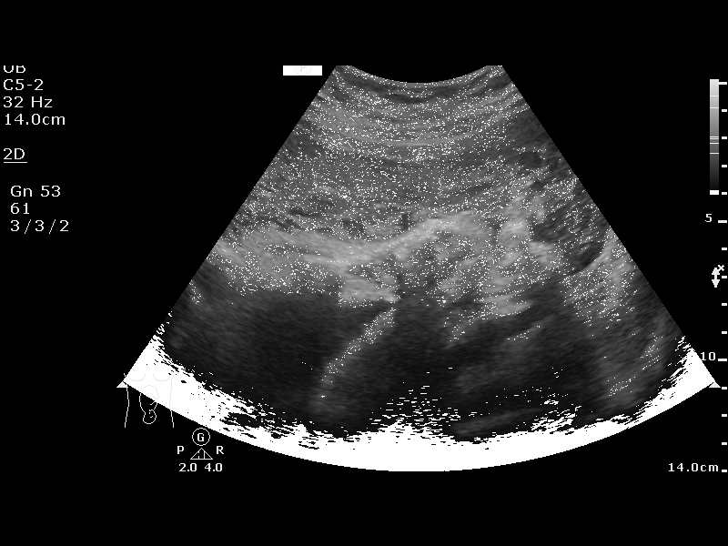
[im 5/13]
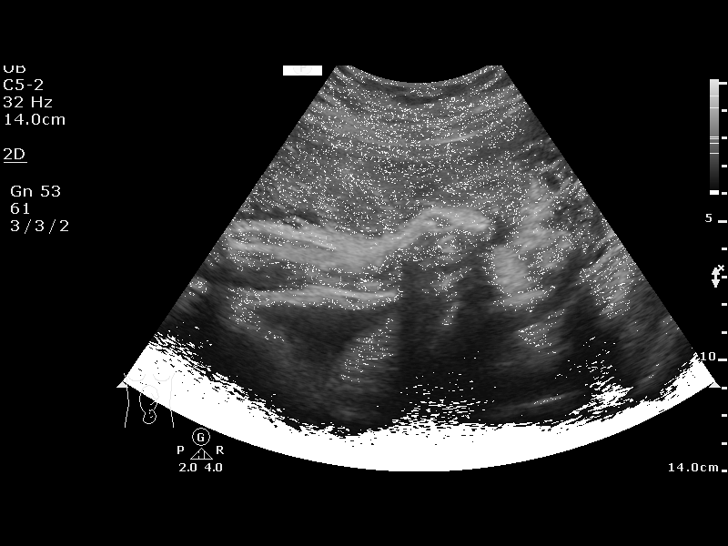
[im 6/13]
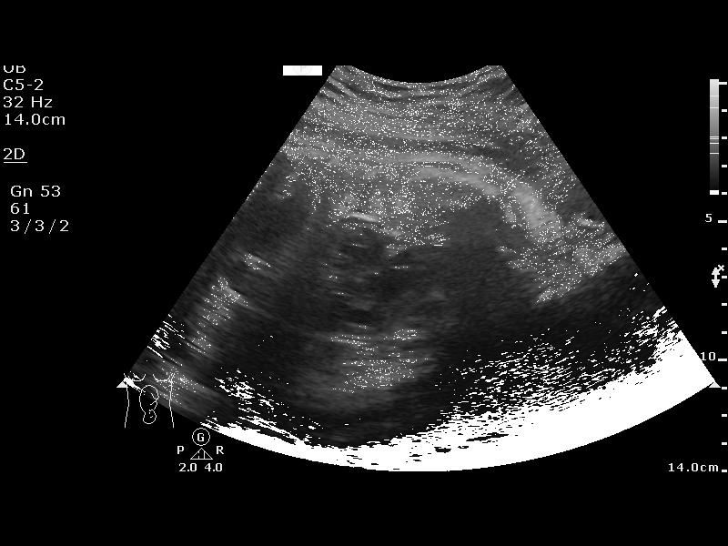
[im 7/13]
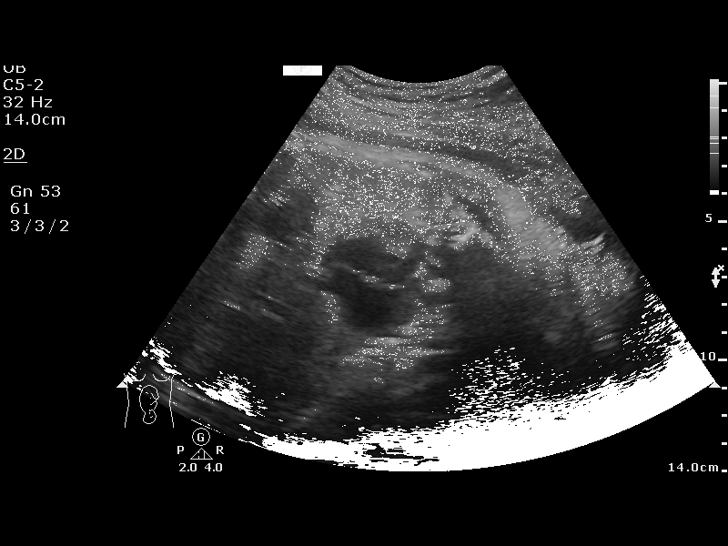
[im 8/13]
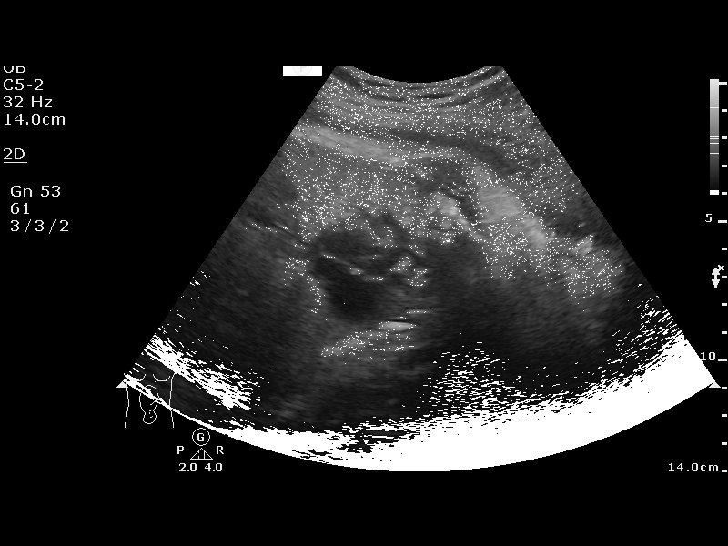
[im 9/13]
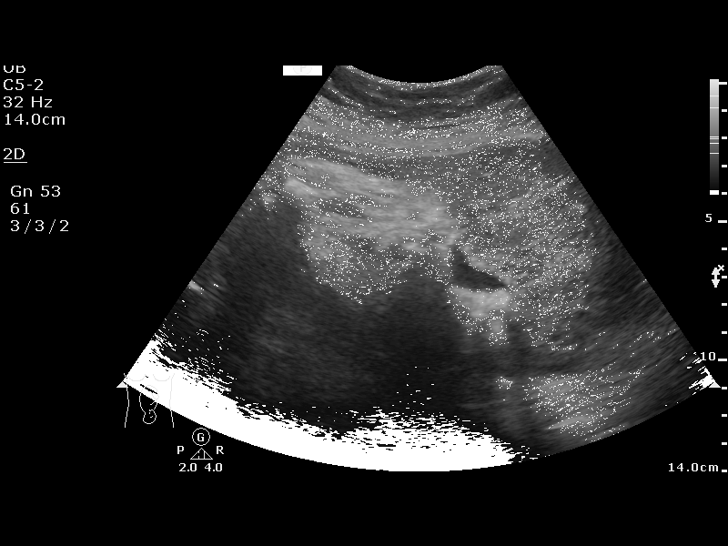
[im 10/13]
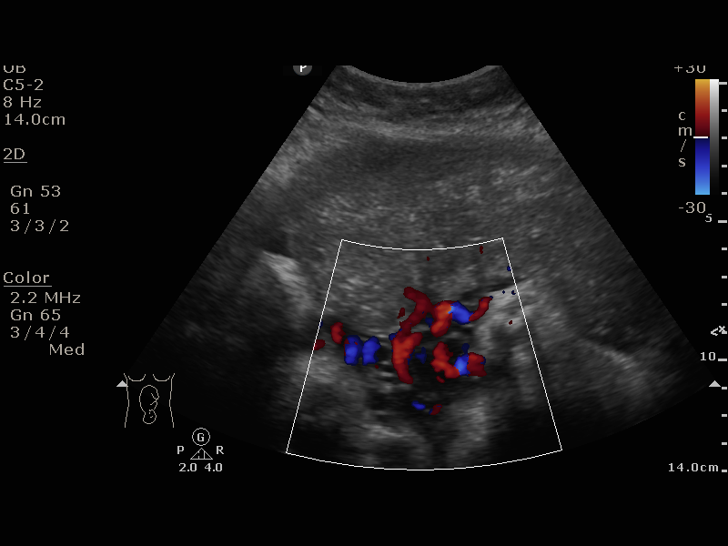
[im 11/13]
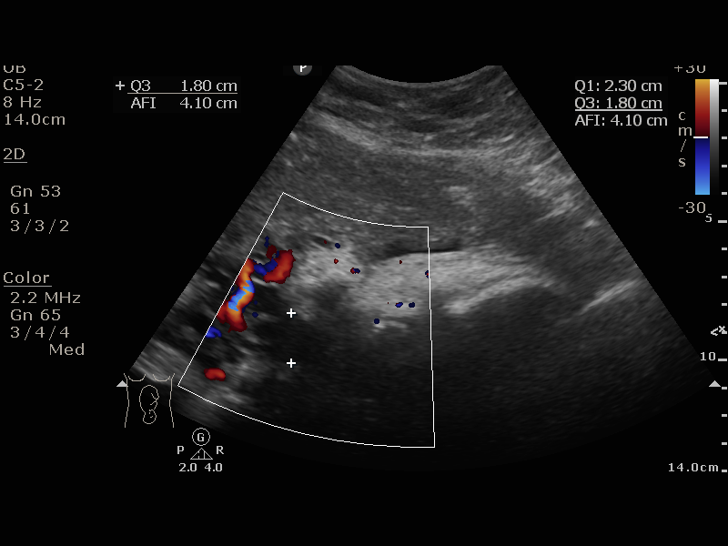
[im 12/13]
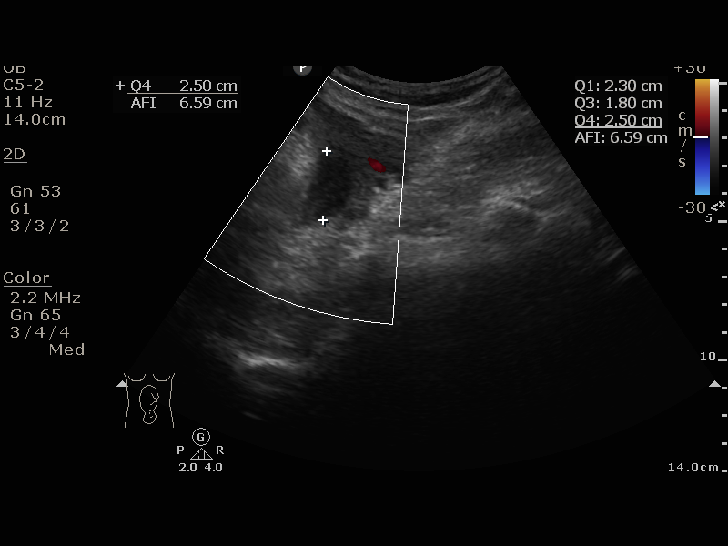
[im 13/13]
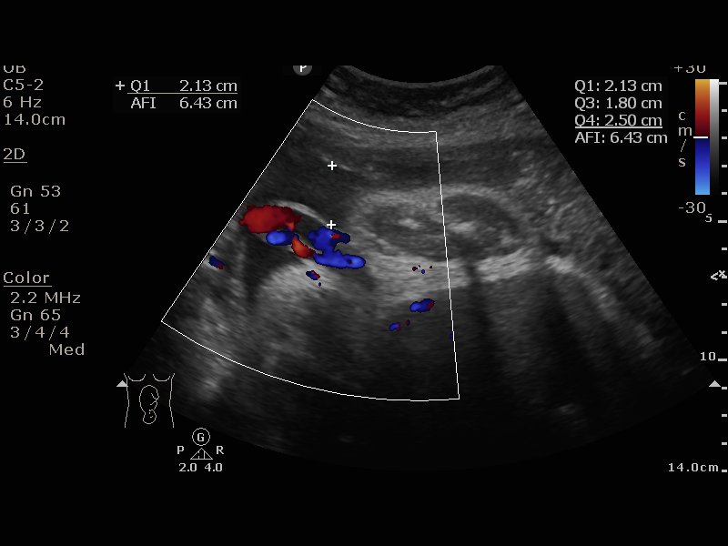

[13 of 13 positions shown; findings below may reference images not displayed]

[facility]                                  [REDACTED]
[facility]care
OB/Gyn Clinic

1  US FETAL BPP W/NONSTRESS                    76818.4

1  PEIYING            [PHONE_NUMBER]      [PHONE_NUMBER]     [PHONE_NUMBER]
Service(s) Provided

Indications

38 weeks gestation of pregnancy
Unspecified pre-existing hypertension          [ST]
complicating pregnancy, third trimester
OB History

Blood Type:            Height:  5'2"   Weight (lb):  238       BMI:
Gravidity:    5         Term:   4
Living:       4
Fetal Evaluation

Num Of Fetuses:     1
Preg. Location:     Intrauterine
Cardiac Activity:   Observed
Presentation:       Cephalic
Amniotic Fluid
AFI FV:      Subjectively low-normal

AFI Sum(cm)     %Tile       Largest Pocket(cm)
6.43            < 3

RUQ(cm)       RLQ(cm)       LUQ(cm)        LLQ(cm)
2.13          2.5           0
Biophysical Evaluation

Amniotic F.V:   Pocket => 2 cm two         F. Tone:        Observed
planes
F. Movement:    Observed                   N.S.T:          Reactive
F. Breathing:   Observed                   Score:          [DATE]
Gestational Age

LMP:           43w 3d        Date:  [DATE]                 EDD:   [DATE]
Best:          38w 2d     Det. By:  U/S  ([DATE])          EDD:   [DATE]
Impression

Low normal fluid volume, will reevaluate next week.  BPP
[DATE].
Recommendations

Continue recommended antenatal testing.

## 2016-12-17 MED ORDER — DEXTROSE 5 % IV SOLN
1.0000 mg/kg/h | INTRAVENOUS | Status: DC
  Administered 2016-12-17 – 2016-12-18 (×4): 1 mg/kg/h via INTRAVENOUS
  Filled 2016-12-17 (×4): qty 40

## 2016-12-17 MED ORDER — MAGNESIUM SULFATE 40 G IN LACTATED RINGERS - SIMPLE
2.0000 g/h | INTRAVENOUS | Status: DC
  Filled 2016-12-17 (×2): qty 500

## 2016-12-17 MED ORDER — OXYTOCIN 40 UNITS IN LACTATED RINGERS INFUSION - SIMPLE MED
2.5000 [IU]/h | INTRAVENOUS | Status: DC
  Administered 2016-12-18: 2.5 [IU]/h via INTRAVENOUS

## 2016-12-17 MED ORDER — OXYTOCIN BOLUS FROM INFUSION
500.0000 mL | Freq: Once | INTRAVENOUS | Status: AC
  Administered 2016-12-18: 500 mL via INTRAVENOUS

## 2016-12-17 MED ORDER — LABETALOL HCL 5 MG/ML IV SOLN
20.0000 mg | INTRAVENOUS | Status: DC | PRN
  Administered 2016-12-18: 20 mg via INTRAVENOUS
  Administered 2016-12-18: 40 mg via INTRAVENOUS
  Filled 2016-12-17: qty 8
  Filled 2016-12-17: qty 4

## 2016-12-17 MED ORDER — HYDRALAZINE HCL 20 MG/ML IJ SOLN: 10.0000 mg | Freq: Once | INTRAMUSCULAR | Status: DC | PRN

## 2016-12-17 MED ORDER — LIDOCAINE HCL (PF) 1 % IJ SOLN
30.0000 mL | INTRAMUSCULAR | Status: DC | PRN
  Filled 2016-12-17: qty 30

## 2016-12-17 MED ORDER — ACETAMINOPHEN 325 MG PO TABS
650.0000 mg | ORAL_TABLET | ORAL | Status: DC | PRN
  Administered 2016-12-17 – 2016-12-18 (×2): 650 mg via ORAL
  Filled 2016-12-17 (×2): qty 2

## 2016-12-17 MED ORDER — LACTATED RINGERS IV SOLN
INTRAVENOUS | Status: DC
  Administered 2016-12-17 – 2016-12-18 (×2): via INTRAVENOUS

## 2016-12-17 MED ORDER — MISOPROSTOL 25 MCG QUARTER TABLET
25.0000 ug | ORAL_TABLET | ORAL | Status: DC
  Administered 2016-12-17: 25 ug via VAGINAL
  Filled 2016-12-17 (×2): qty 1

## 2016-12-17 MED ORDER — FENTANYL CITRATE (PF) 100 MCG/2ML IJ SOLN
50.0000 ug | INTRAMUSCULAR | Status: DC | PRN
  Administered 2016-12-17 – 2016-12-18 (×2): 50 ug via INTRAVENOUS
  Filled 2016-12-17 (×2): qty 2

## 2016-12-17 MED ORDER — TERBUTALINE SULFATE 1 MG/ML IJ SOLN: 0.2500 mg | Freq: Once | INTRAMUSCULAR | Status: DC | PRN

## 2016-12-17 MED ORDER — SOD CITRATE-CITRIC ACID 500-334 MG/5ML PO SOLN: 30.0000 mL | ORAL | Status: DC | PRN

## 2016-12-17 MED ORDER — ZOLPIDEM TARTRATE 5 MG PO TABS
5.0000 mg | ORAL_TABLET | Freq: Every evening | ORAL | Status: DC | PRN
  Administered 2016-12-17: 5 mg via ORAL
  Filled 2016-12-17: qty 1

## 2016-12-17 MED ORDER — ONDANSETRON HCL 4 MG/2ML IJ SOLN: 4.0000 mg | Freq: Four times a day (QID) | INTRAMUSCULAR | Status: DC | PRN

## 2016-12-17 MED ORDER — MAGNESIUM SULFATE BOLUS VIA INFUSION
4.0000 g | Freq: Once | INTRAVENOUS | Status: AC
Start: 2016-12-17 — End: 2016-12-17
  Administered 2016-12-17: 4 g via INTRAVENOUS
  Filled 2016-12-17: qty 500

## 2016-12-17 MED ORDER — OXYTOCIN 40 UNITS IN LACTATED RINGERS INFUSION - SIMPLE MED
1.0000 m[IU]/min | INTRAVENOUS | Status: DC
  Administered 2016-12-17: 2 m[IU]/min via INTRAVENOUS
  Filled 2016-12-17: qty 1000

## 2016-12-17 MED ORDER — LACTATED RINGERS IV SOLN: 500.0000 mL | INTRAVENOUS | Status: DC | PRN

## 2016-12-17 MED ORDER — DEXTROSE 5 % IV SOLN
2.0000 mg/kg | Freq: Once | INTRAVENOUS | Status: AC
  Administered 2016-12-17: 237 mg via INTRAVENOUS
  Filled 2016-12-17: qty 23.7

## 2016-12-17 NOTE — Progress Notes (Signed)
Pt states she has a "little headache".  She also has some blurry vision and dizziness which is not new or different.  IOL scheduled on 11/13 @ midnight
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
## 2016-12-17 NOTE — MAU Note (Addendum)
Pt sent up from clinic with elevated b/p. C/o headache and dizziness. Good fetal movement reported.also reports having pain under her right rib cage.

## 2016-12-17 NOTE — Telephone Encounter (Signed)
Patient is inpatient today.

## 2016-12-17 NOTE — Anesthesia Pain Management Evaluation Note (Signed)
  [facility] Pain Management Visit Note  Patient: Catherine MccallumMarkita L Hollingshed, 31 y.o., female  "Hello I am a [facility]. We have an anesthesia team available at all times to provide care throughout the hospital, including epidural management and anesthesia for C-section. I don't know your plan for the delivery whether it a natural birth, water birth, IV sedation, nitrous supplementation, doula or epidural, but we want to meet your pain goals."   1.Was your pain managed to your expectations on prior hospitalizations?   Yes   2.What is your expectation for pain management during this hospitalization?     IV pain meds  3.How can we help you reach that goal? Epidural if desired but plans IV pain medication.  Record the patient's initial score and the patient's pain goal.   Pain: 3  Pain Goal: 7 [facility] wants you to be able to say your pain was always managed very well.  Cathlene Gardella 12/17/2016
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
## 2016-12-17 NOTE — Progress Notes (Signed)
Catherine MccallumMarkita L Simmons is a 31 y.o. G5P4004 at 2732w2d admitted for induction of labor due to pre-eclampsia with severe features.  Subjective: Patient was resting comfortably in the bed. She had no complaints at this time.  Objective: BP (!) 152/96   Pulse 90   Temp 98.6 F (37 C) (Oral)   Resp 16   Ht 5\' 2"  (1.575 m)   Wt 261 lb (118.4 kg)   LMP 02/17/2016 (Approximate)   SpO2 99%   BMI 47.74 kg/m  No intake/output data recorded. Total I/O In: 924.5 [P.O.:480; I.V.:444.5] Out: 500 [Urine:500]  FHT:  FHR: 140 bpm, variability: moderate,  accelerations:  Present,  decelerations:  Absent UC:   irregular, every 4-6 minutes SVE:   Dilation: Fingertip Effacement (%): Thick Station: [place], -3 Exam by:: C Neill CNM  Labs: Lab Results  Component Value Date   WBC 5.7 12/17/2016   HGB 9.2 (L) 12/17/2016   HCT 27.8 (L) 12/17/2016   MCV 69.7 (L) 12/17/2016   PLT 251 12/17/2016    Assessment / Plan: IOL due to pre-eclampsia with severe features  Labor: giving cytotec as uterus was too posterior for foley bulb Preeclampsia:  on magnesium sulfate, no signs or symptoms of toxicity and labs stable Fetal Wellbeing:  Category I Pain Control:  Epidural I/D:  GBS neg Anticipated MOD:  NSVD  Arlyce Harmanimothy Linn Clavin 12/17/2016, 6:22 PM

## 2016-12-17 NOTE — Progress Notes (Signed)
Pt informed that the ultrasound is considered a limited OB ultrasound and is not intended to be a complete ultrasound exam.  Patient also informed that the ultrasound is not being completed with the intent of assessing for fetal or placental anomalies or any pelvic abnormalities.  Explained that the purpose of today's ultrasound is to assess for presentation, BPP and amniotic fluid volume.  Patient acknowledges the purpose of the exam and the limitations of the study.    Pt to MAU for evaluation of elevated BP.

## 2016-12-17 NOTE — Progress Notes (Signed)
Patient Vitals for the past 4 hrs:  BP Temp Temp src Pulse Resp  12/17/16 2201 (!) 146/73 - - 89 18  12/17/16 2101 136/87 98 F (36.7 C) Oral 88 18  12/17/16 2001 (!) 144/85 - - 87 16  12/17/16 1901 (!) 155/82 - - 94 16    MgS04 @ 2gm/hr. No HA, RUQ pain, vision changes. FHR 130s, Cat 1.  Ctx mild and irregular, q 2-5 minutes.  Cx 2/50/-2, super soft.  Foley inserted and inflated w/60cc H20.  Plan Pitocin after foley falls out.

## 2016-12-17 NOTE — Progress Notes (Signed)
HPI: Catherine MccallumMarkita L Simmons is a 31 y.o. female who presents to the [facility] pharmacy clinic to follow-up for her HIV infection.   Allergies: Allergies  Allergen Reactions  . Stadol [Butorphanol Tartrate] Itching    Tolerates percocet    Past Medical History: Past Medical History:  Diagnosis Date  . Anemia   . Cholecystitis 07/16/10  . Genital warts 2004  . Hemorrhoids   . HIV (human immunodeficiency virus infection) (HCC)   . Hx of pelvic inflammatory disease 08/31/2013   And hx of multiple STDs   . Hypertension   . Pregnancy induced hypertension   . Sickle cell trait (HCC)     Social History: Social History   Socioeconomic History  . Marital status: Single    Spouse name: Not on file  . Number of children: Not on file  . Years of education: Not on file  . Highest education level: Not on file  Social Needs  . Financial resource strain: Not on file  . Food insecurity - worry: Not on file  . Food insecurity - inability: Not on file  . Transportation needs - medical: Not on file  . Transportation needs - non-medical: Not on file  Occupational History  . Not on file  Tobacco Use  . Smoking status: Former Smoker    Packs/day: 0.30    Types: Cigarettes    Start date: 03/11/2012    Last attempt to quit: 12/11/2015    Years since quitting: 1.0  . Smokeless tobacco: Never Used  Substance and Sexual Activity  . Alcohol use: No    Comment: occ  . Drug use: No  . Sexual activity: Yes    Partners: Male    Birth control/protection: None  Other Topics Concern  . Not on file  Social History Narrative  . Not on file    Current Regimen: Isentress + Truvada  Labs: HIV 1 RNA Quant (copies/mL)  Date Value  10/20/2016 <20 NOT DETECTED  06/11/2015 <20  04/24/2015 <20   HIV-1 RNA Viral Load  Date Value  11/19/2016 <20 copies/mL  05/15/2014 <40  04/09/2014 <40   CD4 (no units)  Date Value  04/09/2014 1,016  02/19/2014 776  12/27/2013 651   CD4 T Cell Abs (/uL)  Date  Value  08/20/2016 670  07/10/2016 720  06/11/2015 870   Hep B S Ab (no units)  Date Value  10/20/2012 POS (A)   Hepatitis B Surface Ag (no units)  Date Value  07/02/2016 Negative  07/02/2016 Negative   HCV Ab (no units)  Date Value  09/28/2013 NEGATIVE    CrCl: Estimated Creatinine Clearance: 124.5 mL/min (by C-G formula based on SCr of 0.48 mg/dL).  Lipids:    Component Value Date/Time   CHOL 237 12/27/2013   TRIG 61 10/20/2012 1057   HDL 50 10/20/2012 1057   CHOLHDL 2.9 10/20/2012 1057   VLDL 12 10/20/2012 1057   LDLCALC 139 12/27/2013    Assessment: Catherine AbbeyMarkita is here today to follow-up for her HIV infection.  She is due to deliver her baby on 11/20. She came in today to get one last viral load before delivery.  She tells me she has been taking her medications and is not having any issues with them. She will follow-up after delivering her baby.  Plans: - Continue Isentress + Truvada - HIV RNA today  Catherine Simmons L. Sydell Prowell, PharmD, AAHIVP, CPP Infectious Diseases Clinical Pharmacist [facility] 12/17/2016, 12:14 PM

## 2016-12-17 NOTE — MAU Note (Signed)
Pt sent from clinic for BP eval.  Pt c/o H/A, dizziness, and blurred vision, epigatsric pain.  DTR's 1+, no clonus, minimal swelling noted.

## 2016-12-18 ENCOUNTER — Inpatient Hospital Stay (HOSPITAL_COMMUNITY): Payer: Medicaid Other | Admitting: Anesthesiology

## 2016-12-18 ENCOUNTER — Other Ambulatory Visit: Payer: Self-pay

## 2016-12-18 ENCOUNTER — Encounter (HOSPITAL_COMMUNITY)
Admission: AD | Disposition: A | Discharge status: Home / Self Care | Payer: Self-pay | Source: Ambulatory Visit | Attending: Obstetrics and Gynecology

## 2016-12-18 ENCOUNTER — Encounter (HOSPITAL_COMMUNITY): Payer: Self-pay | Admitting: Anesthesiology

## 2016-12-18 DIAGNOSIS — O1092 Unspecified pre-existing hypertension complicating childbirth: Secondary | ICD-10-CM

## 2016-12-18 DIAGNOSIS — O114 Pre-existing hypertension with pre-eclampsia, complicating childbirth: Secondary | ICD-10-CM

## 2016-12-18 DIAGNOSIS — Z3A38 38 weeks gestation of pregnancy: Secondary | ICD-10-CM

## 2016-12-18 LAB — CBC
HCT: 25.2 % — ABNORMAL LOW (ref 36.0–46.0)
HEMOGLOBIN: 8.9 g/dL — AB (ref 12.0–15.0)
MCH: 24.7 pg — AB (ref 26.0–34.0)
MCHC: 35.3 g/dL (ref 30.0–36.0)
MCV: 69.8 fL — ABNORMAL LOW (ref 78.0–100.0)
Platelets: 221 10*3/uL (ref 150–400)
RBC: 3.61 MIL/uL — ABNORMAL LOW (ref 3.87–5.11)
RDW: 14.6 % (ref 11.5–15.5)
WBC: 7.7 10*3/uL (ref 4.0–10.5)

## 2016-12-18 LAB — RPR: RPR: NONREACTIVE

## 2016-12-18 SURGERY — LIGATION, FALLOPIAN TUBE, POSTPARTUM
Anesthesia: Spinal | Site: Abdomen | Wound class: Clean Contaminated

## 2016-12-18 MED ORDER — IBUPROFEN 600 MG PO TABS
600.0000 mg | ORAL_TABLET | Freq: Four times a day (QID) | ORAL | Status: DC | PRN
  Administered 2016-12-18 – 2016-12-20 (×7): 600 mg via ORAL
  Filled 2016-12-18 (×8): qty 1

## 2016-12-18 MED ORDER — FENTANYL CITRATE (PF) 100 MCG/2ML IJ SOLN
INTRAMUSCULAR | Status: AC
  Filled 2016-12-18: qty 2

## 2016-12-18 MED ORDER — SIMETHICONE 80 MG PO CHEW: 80.0000 mg | CHEWABLE_TABLET | ORAL | Status: DC | PRN

## 2016-12-18 MED ORDER — WITCH HAZEL-GLYCERIN EX PADS: 1.0000 "application " | MEDICATED_PAD | CUTANEOUS | Status: DC | PRN

## 2016-12-18 MED ORDER — SENNOSIDES-DOCUSATE SODIUM 8.6-50 MG PO TABS
2.0000 | ORAL_TABLET | ORAL | Status: DC
  Administered 2016-12-19 (×2): 2 via ORAL
  Filled 2016-12-18 (×2): qty 2

## 2016-12-18 MED ORDER — DIPHENHYDRAMINE HCL 50 MG/ML IJ SOLN: 12.5000 mg | INTRAMUSCULAR | Status: DC | PRN

## 2016-12-18 MED ORDER — PRENATAL MULTIVITAMIN CH: 1.0000 | ORAL_TABLET | Freq: Every day | ORAL | Status: DC

## 2016-12-18 MED ORDER — ZOLPIDEM TARTRATE 5 MG PO TABS: 5.0000 mg | ORAL_TABLET | Freq: Every evening | ORAL | Status: DC | PRN

## 2016-12-18 MED ORDER — COCONUT OIL OIL: 1.0000 "application " | TOPICAL_OIL | Status: DC | PRN

## 2016-12-18 MED ORDER — RALTEGRAVIR POTASSIUM 400 MG PO TABS
1200.0000 mg | ORAL_TABLET | Freq: Every day | ORAL | Status: DC
  Administered 2016-12-18 – 2016-12-20 (×3): 1200 mg via ORAL
  Filled 2016-12-18 (×4): qty 3

## 2016-12-18 MED ORDER — LACTATED RINGERS IV SOLN
INTRAVENOUS | Status: AC
  Administered 2016-12-18: 18:00:00 via INTRAVENOUS

## 2016-12-18 MED ORDER — BENZOCAINE-MENTHOL 20-0.5 % EX AERO
1.0000 "application " | INHALATION_SPRAY | CUTANEOUS | Status: DC | PRN
  Administered 2016-12-18: 1 via TOPICAL
  Filled 2016-12-18: qty 56

## 2016-12-18 MED ORDER — PHENYLEPHRINE 40 MCG/ML (10ML) SYRINGE FOR IV PUSH (FOR BLOOD PRESSURE SUPPORT)
80.0000 ug | PREFILLED_SYRINGE | INTRAVENOUS | Status: DC | PRN
  Filled 2016-12-18: qty 5

## 2016-12-18 MED ORDER — PHENYLEPHRINE 40 MCG/ML (10ML) SYRINGE FOR IV PUSH (FOR BLOOD PRESSURE SUPPORT)
80.0000 ug | PREFILLED_SYRINGE | INTRAVENOUS | Status: DC | PRN
  Filled 2016-12-18: qty 5
  Filled 2016-12-18: qty 10

## 2016-12-18 MED ORDER — MAGNESIUM SULFATE 40 G IN LACTATED RINGERS - SIMPLE
2.0000 g/h | INTRAVENOUS | Status: AC
  Administered 2016-12-18: 2 g/h via INTRAVENOUS
  Filled 2016-12-18: qty 500

## 2016-12-18 MED ORDER — TETANUS-DIPHTH-ACELL PERTUSSIS 5-2.5-18.5 LF-MCG/0.5 IM SUSP: 0.5000 mL | Freq: Once | INTRAMUSCULAR | Status: DC

## 2016-12-18 MED ORDER — EPHEDRINE 5 MG/ML INJ
10.0000 mg | INTRAVENOUS | Status: DC | PRN
  Filled 2016-12-18: qty 2

## 2016-12-18 MED ORDER — DIBUCAINE 1 % RE OINT
1.0000 | TOPICAL_OINTMENT | RECTAL | Status: DC | PRN
Start: 2016-12-18 — End: 2016-12-20

## 2016-12-18 MED ORDER — HYDROCHLOROTHIAZIDE 12.5 MG PO CAPS
12.5000 mg | ORAL_CAPSULE | Freq: Every day | ORAL | Status: DC
Start: 2016-12-18 — End: 2016-12-20
  Administered 2016-12-18 – 2016-12-20 (×3): 12.5 mg via ORAL
  Filled 2016-12-18 (×4): qty 1

## 2016-12-18 MED ORDER — ONDANSETRON HCL 4 MG/2ML IJ SOLN: 4.0000 mg | INTRAMUSCULAR | Status: DC | PRN

## 2016-12-18 MED ORDER — AMLODIPINE BESYLATE 5 MG PO TABS
5.0000 mg | ORAL_TABLET | Freq: Every day | ORAL | Status: DC
  Administered 2016-12-18 – 2016-12-20 (×3): 5 mg via ORAL
  Filled 2016-12-18 (×3): qty 1

## 2016-12-18 MED ORDER — FLEET ENEMA 7-19 GM/118ML RE ENEM
1.0000 | ENEMA | Freq: Once | RECTAL | Status: DC
Start: 2016-12-18 — End: 2016-12-18

## 2016-12-18 MED ORDER — ONDANSETRON HCL 4 MG PO TABS: 4.0000 mg | ORAL_TABLET | ORAL | Status: DC | PRN

## 2016-12-18 MED ORDER — EMTRICITABINE-TENOFOVIR AF 200-25 MG PO TABS
1.0000 | ORAL_TABLET | Freq: Every day | ORAL | Status: DC
  Administered 2016-12-18 – 2016-12-20 (×3): 1 via ORAL
  Filled 2016-12-18 (×4): qty 1

## 2016-12-18 MED ORDER — DIPHENHYDRAMINE HCL 25 MG PO CAPS
25.0000 mg | ORAL_CAPSULE | Freq: Four times a day (QID) | ORAL | Status: DC | PRN
  Administered 2016-12-19: 25 mg via ORAL
  Filled 2016-12-18: qty 1

## 2016-12-18 MED ORDER — ACETAMINOPHEN 325 MG PO TABS
650.0000 mg | ORAL_TABLET | ORAL | Status: DC | PRN
  Administered 2016-12-18 – 2016-12-20 (×7): 650 mg via ORAL
  Filled 2016-12-18 (×7): qty 2

## 2016-12-18 MED ORDER — FENTANYL 2.5 MCG/ML BUPIVACAINE 1/10 % EPIDURAL INFUSION (WH - ANES)
14.0000 mL/h | INTRAMUSCULAR | Status: DC | PRN
  Filled 2016-12-18: qty 100

## 2016-12-18 MED ORDER — MIDAZOLAM HCL 2 MG/2ML IJ SOLN
INTRAMUSCULAR | Status: AC
  Filled 2016-12-18: qty 2

## 2016-12-18 MED ORDER — LACTATED RINGERS IV SOLN: 500.0000 mL | Freq: Once | INTRAVENOUS | Status: DC

## 2016-12-18 NOTE — Progress Notes (Signed)
Foley fell out around MN.  Pitocin now at 14 mu/min.  Wants Epidural.  Passed 25 cent clot. AROM w/clear fluid. Cx now 7-8.  WIll still plan for epidural.

## 2016-12-18 NOTE — Progress Notes (Signed)
Labor Progress Note Catherine MccallumMarkita L Simmons is a 31 y.o. G5P4004 at 4959w3d presented for IOL cHTN and PreE w/ severe features S: Patient resting comfortably in bed.  O:  BP 120/65   Pulse 80   Temp 98.2 F (36.8 C) (Oral)   Resp 18   Ht 5\' 2"  (1.575 m)   Wt 261 lb (118.4 kg)   LMP 02/17/2016 (Approximate)   SpO2 99%   BMI 47.74 kg/m  EFM: 130/mod vari/reactive  CVE: Dilation: 4.5 Effacement (%): 60, 70 Cervical Position: Middle Station: -2 Presentation: Vertex Exam by:: Hermenia BersMichelle Kelly, RN   A&P: 31 y.o. [id number] 1459w3d here for IOL cHTN and PreE w/ severe features #Labor: Progressing well. #Pain: per patient request #FWB: cat 1 #GBS negative   Suella BroadKeriann S Laportia Carley, MD 1:43 AM

## 2016-12-18 NOTE — Anesthesia Preprocedure Evaluation (Deleted)
Anesthesia Evaluation  Patient identified by MRN, date of birth, ID band Patient awake    Reviewed: Allergy & Precautions, NPO status , Patient's Chart, lab work & pertinent test results, reviewed documented beta blocker date and time   Airway Mallampati: III  TM Distance: >3 FB Neck ROM: Full    Dental  (+) Edentulous Upper, Edentulous Lower   Pulmonary former smoker,    Pulmonary exam normal breath sounds clear to auscultation       Cardiovascular hypertension, Pt. on medications and Pt. on home beta blockers Normal cardiovascular exam Rhythm:Regular Rate:Normal     Neuro/Psych PSYCHIATRIC DISORDERS Anxiety Depression  Neuromuscular disease    GI/Hepatic Neg liver ROS, GERD  ,  Endo/Other  Morbid obesity  Renal/GU negative Renal ROS  negative genitourinary   Musculoskeletal Nummular eczema   Abdominal (+) + obese, + scaphoid   Peds  Hematology  (+) Sickle cell trait and anemia , HIV,   Anesthesia Other Findings   Reproductive/Obstetrics                             Anesthesia Physical Anesthesia Plan  ASA: III  Anesthesia Plan: Spinal   Post-op Pain Management:    Induction:   PONV Risk Score and Plan: Ondansetron, Midazolam, Metaclopromide and Treatment may vary due to age or medical condition  Airway Management Planned: Natural Airway and Nasal Cannula  Additional Equipment:   Intra-op Plan:   Post-operative Plan:   Informed Consent: I have reviewed the patients History and Physical, chart, labs and discussed the procedure including the risks, benefits and alternatives for the proposed anesthesia with the patient or authorized representative who has indicated his/her understanding and acceptance.     Plan Discussed with: CRNA, Anesthesiologist and Surgeon  Anesthesia Plan Comments:         Anesthesia Quick Evaluation

## 2016-12-19 ENCOUNTER — Encounter (HOSPITAL_COMMUNITY)
Admission: AD | Disposition: A | Discharge status: Home / Self Care | Payer: Self-pay | Source: Ambulatory Visit | Attending: Obstetrics and Gynecology

## 2016-12-19 LAB — HIV-1 RNA QUANT-NO REFLEX-BLD
HIV 1 RNA Quant: <20 copies/mL
HIV-1 RNA Quant, Log: <1.3 Log copies/mL

## 2016-12-19 SURGERY — LIGATION, FALLOPIAN TUBE, POSTPARTUM
Anesthesia: Choice | Laterality: Bilateral

## 2016-12-19 MED ORDER — PNEUMOCOCCAL VAC POLYVALENT 25 MCG/0.5ML IJ INJ
0.5000 mL | INJECTION | INTRAMUSCULAR | Status: AC
  Administered 2016-12-20: 0.5 mL via INTRAMUSCULAR
  Filled 2016-12-19: qty 0.5

## 2016-12-19 MED ORDER — FENTANYL CITRATE (PF) 100 MCG/2ML IJ SOLN
100.0000 ug | Freq: Once | INTRAMUSCULAR | Status: AC
  Administered 2016-12-19: 100 ug via INTRAVENOUS
  Filled 2016-12-19: qty 2

## 2016-12-19 NOTE — Progress Notes (Signed)
CSW arranged for speciality care for baby at [facility].   Giara Mcgaughey, MSW, LCSW-A Clinical Social Worker  [facility]  Office: [phone]   

## 2016-12-19 NOTE — Progress Notes (Signed)
Post Partum Day 1 Subjective: headache resolved  Objective: Blood pressure 137/85, pulse 67, temperature 98.2 F (36.8 C), temperature source Oral, resp. rate 20, height 5\' 2"  (1.575 m), weight 261 lb (118.4 kg), last menstrual period 02/17/2016, SpO2 99 %, unknown if currently breastfeeding.  Physical Exam:  General: alert, cooperative and no distress Lochia: appropriate Uterine Fundus: firm Incision: none DVT Evaluation: No evidence of DVT seen on physical exam.  Recent Labs    12/17/16 1406 12/18/16 0558  HGB 9.2* 8.9*  HCT 27.8* 25.2*    Assessment/Plan: Breastfeeding and Contraception nexplanon   LOS: 2 days   Scheryl DarterJames Paulena Servais 12/19/2016, 10:18 AM

## 2016-12-19 NOTE — Progress Notes (Signed)
MOB was referred for history of depression/anxiety.  Referral is screened out by Clinical Social Worker because none of the following criteria appear to apply and there are no reports impacting the pregnancy or her transition to the postpartum period.  CSW does not deem it clinically necessary to further investigate at this time.   -History of anxiety/depression during this pregnancy, or of post-partum depression.  - Diagnosis of anxiety and/or depression within last 3 years.-  - History of depression due to pregnancy loss/loss of child or -MOB's symptoms are currently being treated with medication and/or therapy.  Please contact the Clinical Social Worker if needs arise or upon MOB request.    Keefer Soulliere, MSW, LCSW-A Clinical Social Worker  [facility]  Office: [phone]   

## 2016-12-20 MED ORDER — IBUPROFEN 600 MG PO TABS: 600.0000 mg | ORAL_TABLET | Freq: Four times a day (QID) | ORAL | 0 refills | Status: DC | PRN

## 2016-12-20 MED ORDER — AMLODIPINE BESYLATE 5 MG PO TABS: 5.0000 mg | ORAL_TABLET | Freq: Every day | ORAL | 1 refills | Status: DC

## 2016-12-20 MED ORDER — HYDROCHLOROTHIAZIDE 12.5 MG PO CAPS: 12.5000 mg | ORAL_CAPSULE | Freq: Every day | ORAL | 1 refills | Status: DC

## 2016-12-20 NOTE — Discharge Instructions (Signed)
Postpartum Care After Vaginal Delivery °The period of time right after you deliver your newborn is called the postpartum period. °What kind of medical care will I receive? °· You may continue to receive fluids and medicines through an IV tube inserted into one of your veins. °· If an incision was made near your vagina (episiotomy) or if you had some vaginal tearing during delivery, cold compresses may be placed on your episiotomy or your tear. This helps to reduce pain and swelling. °· You may be given a squirt bottle to use when you go to the bathroom. You may use this until you are comfortable wiping as usual. To use the squirt bottle, follow these steps: °? Before you urinate, fill the squirt bottle with warm water. Do not use hot water. °? After you urinate, while you are sitting on the toilet, use the squirt bottle to rinse the area around your urethra and vaginal opening. This rinses away any urine and blood. °? You may do this instead of wiping. As you start healing, you may use the squirt bottle before wiping yourself. Make sure to wipe gently. °? Fill the squirt bottle with clean water every time you use the bathroom. °· You will be given sanitary pads to wear. °How can I expect to feel? °· You may not feel the need to urinate for several hours after delivery. °· You will have some soreness and pain in your abdomen and vagina. °· If you are breastfeeding, you may have uterine contractions every time you breastfeed for up to several weeks postpartum. Uterine contractions help your uterus return to its normal size. °· It is normal to have vaginal bleeding (lochia) after delivery. The amount and appearance of lochia is often similar to a menstrual period in the first week after delivery. It will gradually decrease over the next few weeks to a dry, yellow-brown discharge. For most women, lochia stops completely by 6-8 weeks after delivery. Vaginal bleeding can vary from woman to woman. °· Within the first few  days after delivery, you may have breast engorgement. This is when your breasts feel heavy, full, and uncomfortable. Your breasts may also throb and feel hard, tightly stretched, warm, and tender. After this occurs, you may have milk leaking from your breasts. Your health care provider can help you relieve discomfort due to breast engorgement. Breast engorgement should go away within a few days. °· You may feel more sad or worried than normal due to hormonal changes after delivery. These feelings should not last more than a few days. If these feelings do not go away after several days, speak with your health care provider. °How should I care for myself? °· Tell your health care provider if you have pain or discomfort. °· Drink enough water to keep your urine clear or pale yellow. °· Wash your hands thoroughly with soap and water for at least 20 seconds after changing your sanitary pads, after using the toilet, and before holding or feeding your baby. °· If you are not breastfeeding, avoid touching your breasts a lot. Doing this can make your breasts produce more milk. °· If you become weak or lightheaded, or you feel like you might faint, ask for help before: °? Getting out of bed. °? Showering. °· Change your sanitary pads frequently. Watch for any changes in your flow, such as a sudden increase in volume, a change in color, the passing of large blood clots. If you pass a blood clot from your vagina, save it   to show to your health care provider. Do not flush blood clots down the toilet without having your health care provider look at them. °· Make sure that all your vaccinations are up to date. This can help protect you and your baby from getting certain diseases. You may need to have immunizations done before you leave the hospital. °· If desired, talk with your health care provider about methods of family planning or birth control (contraception). °How can I start bonding with my baby? °Spending as much time as  possible with your baby is very important. During this time, you and your baby can get to know each other and develop a bond. Having your baby stay with you in your room (rooming in) can give you time to get to know your baby. Rooming in can also help you become comfortable caring for your baby. Breastfeeding can also help you bond with your baby. °How can I plan for returning home with my baby? °· Make sure that you have a car seat installed in your vehicle. °? Your car seat should be checked by a certified car seat installer to make sure that it is installed safely. °? Make sure that your baby fits into the car seat safely. °· Ask your health care provider any questions you have about caring for yourself or your baby. Make sure that you are able to contact your health care provider with any questions after leaving the hospital. °This information is not intended to replace advice given to you by your health care provider. Make sure you discuss any questions you have with your health care provider. °Document Released: 11/23/2006 Document Revised: 07/01/2015 Document Reviewed: 12/31/2014 °Elsevier Interactive Patient Education © 2018 [employer] Inc[employer] ° °
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
## 2016-12-20 NOTE — Progress Notes (Signed)
All discharge teaching completed with the patient. Patient given printed discharge instructions. Patient verbalizes an understanding of discharge instructions. No questions or concerns voiced at this time.

## 2016-12-22 ENCOUNTER — Inpatient Hospital Stay (HOSPITAL_COMMUNITY): Payer: Medicaid Other

## 2016-12-23 ENCOUNTER — Other Ambulatory Visit: Payer: Self-pay | Admitting: *Deleted

## 2016-12-25 MED FILL — TRUVADA 200-300 MG TABS: 200-300 | 30 days supply | Qty: 30 | Fill #6

## 2016-12-25 MED FILL — ISENTRESS HD 600 MG TAB: 600 | 30 days supply | Qty: 60 | Fill #5

## 2016-12-28 ENCOUNTER — Other Ambulatory Visit: Payer: Self-pay | Admitting: Obstetrics & Gynecology

## 2016-12-28 MED ORDER — AMLODIPINE BESYLATE 10 MG PO TABS: 10.0000 mg | ORAL_TABLET | Freq: Every day | ORAL | 1 refills | Status: DC

## 2017-01-26 ENCOUNTER — Ambulatory Visit: Payer: Self-pay | Admitting: Internal Medicine

## 2017-01-27 ENCOUNTER — Encounter: Payer: Self-pay | Admitting: Advanced Practice Midwife

## 2017-01-27 ENCOUNTER — Ambulatory Visit (INDEPENDENT_AMBULATORY_CARE_PROVIDER_SITE_OTHER): Payer: Medicaid Other | Admitting: Advanced Practice Midwife

## 2017-01-27 VITALS — BP 151/96 | HR 74 | Ht 62.0 in | Wt 255.0 lb

## 2017-01-27 DIAGNOSIS — Z1389 Encounter for screening for other disorder: Secondary | ICD-10-CM | POA: Diagnosis not present

## 2017-01-27 DIAGNOSIS — Z3049 Encounter for surveillance of other contraceptives: Secondary | ICD-10-CM

## 2017-01-27 DIAGNOSIS — Z3202 Encounter for pregnancy test, result negative: Secondary | ICD-10-CM

## 2017-01-27 DIAGNOSIS — I10 Essential (primary) hypertension: Secondary | ICD-10-CM

## 2017-01-27 DIAGNOSIS — O1490 Unspecified pre-eclampsia, unspecified trimester: Secondary | ICD-10-CM

## 2017-01-27 DIAGNOSIS — Z30019 Encounter for initial prescription of contraceptives, unspecified: Secondary | ICD-10-CM

## 2017-01-27 LAB — POCT PREGNANCY, URINE: Preg Test, Ur: NEGATIVE

## 2017-01-27 MED ORDER — ETONOGESTREL 68 MG ~~LOC~~ IMPL
68.0000 mg | DRUG_IMPLANT | Freq: Once | SUBCUTANEOUS | Status: AC
  Administered 2017-01-27: 68 mg via SUBCUTANEOUS

## 2017-01-27 MED ORDER — HYDROCHLOROTHIAZIDE 25 MG PO TABS: 25.0000 mg | ORAL_TABLET | Freq: Every day | ORAL | 3 refills | Status: DC

## 2017-01-27 NOTE — Progress Notes (Signed)
Subjective:     Catherine MccallumMarkita L Simmons is a 31 y.o. female who presents for a postpartum visit. She is 5 weeks postpartum following a spontaneous vaginal delivery. I have fully reviewed the prenatal and intrapartum course. The delivery was at 38.2 gestational weeks. Outcome: spontaneous vaginal delivery. Anesthesia: none. Postpartum course has been unremarkable. Baby's course has been unremarkable. Baby is feeding by bottle - Similac Neosure. Bleeding no bleeding. Bowel function is normal. Bladder function is normal. Patient is not sexually active. Contraception method is none. Patient is interested in Nexplanon. Postpartum depression screening: positive.  The following portions of the patient's history were reviewed and updated as appropriate: allergies, current medications, past family history, past medical history, past social history, past surgical history and problem list.  Review of Systems Pertinent items are noted in HPI.   Objective:    LMP 02/17/2016 (Approximate)   General:  alert, cooperative and no distress   Breasts:  inspection negative, no nipple discharge or bleeding, no masses or nodularity palpable  Lungs: clear to auscultation bilaterally  Heart:  regular rate and rhythm, S1, S2 normal, no murmur, click, rub or gallop  Abdomen: soft, non-tender; bowel sounds normal; no masses,  no organomegaly   Vulva:  not evaluated  Vagina: not evaluated  Cervix:  n/a  Corpus: not examined  Adnexa:  not evaluated  Rectal Exam: Not performed.        Patient given informed consent, she signed consent form. Pregnancy test was negative.  Appropriate time out taken.  Patient's left arm was prepped and draped in the usual sterile fashion.. The ruler used to measure and mark insertion area.  Patient was prepped with alcohol swab and then injected with 5 ml of 1 % lidocaine.  She was prepped with betadine, Nexplanon removed from packaging,  Device confirmed in needle, then inserted full length of  needle and withdrawn per handbook instructions.  There was minimal blood loss.  Patient insertion site covered with guaze and a pressure bandage to reduce any bruising.  The patient tolerated the procedure well and was given post procedure instructions. Return in about one month for Nexplanon check.    Assessment:     Normal postpartum exam. Pap smear not done at today's visit.   Plan:    1. Contraception: Nexplanon 2. Reviewed irreg bleeding with Nexplanon.  Card given. 3.   Consult Dr Vergie LivingPickens re: hypertension.  Increase HCTZ to 25mg , continue Amlodipine 10mg  4.   Follow up in: 1 week with MD per recomm Dr Vergie LivingPickens.

## 2017-01-27 NOTE — Patient Instructions (Signed)
Etonogestrel implant What is this medicine? ETONOGESTREL (et oh noe JES trel) is a contraceptive (birth control) device. It is used to prevent pregnancy. It can be used for up to 3 years. This medicine may be used for other purposes; ask your health care provider or pharmacist if you have questions. COMMON BRAND NAME(S): Implanon, Nexplanon What should I tell my health care provider before I take this medicine? They need to know if you have any of these conditions: -abnormal vaginal bleeding -blood vessel disease or blood clots -cancer of the breast, cervix, or liver -depression -diabetes -gallbladder disease -headaches -heart disease or recent heart attack -high blood pressure -high cholesterol -kidney disease -liver disease -renal disease -seizures -tobacco smoker -an unusual or allergic reaction to etonogestrel, other hormones, anesthetics or antiseptics, medicines, foods, dyes, or preservatives -pregnant or trying to get pregnant -breast-feeding How should I use this medicine? This device is inserted just under the skin on the inner side of your upper arm by a health care professional. Talk to your pediatrician regarding the use of this medicine in children. Special care may be needed. Overdosage: If you think you have taken too much of this medicine contact a poison control center or emergency room at once. NOTE: This medicine is only for you. Do not share this medicine with others. What if I miss a dose? This does not apply. What may interact with this medicine? Do not take this medicine with any of the following medications: -amprenavir -bosentan -fosamprenavir This medicine may also interact with the following medications: -barbiturate medicines for inducing sleep or treating seizures -certain medicines for fungal infections like ketoconazole and itraconazole -grapefruit juice -griseofulvin -medicines to treat seizures like carbamazepine, felbamate, oxcarbazepine,  phenytoin, topiramate -modafinil -phenylbutazone -rifampin -rufinamide -some medicines to treat HIV infection like atazanavir, indinavir, lopinavir, nelfinavir, tipranavir, ritonavir -St. John's wort This list may not describe all possible interactions. Give your health care provider a list of all the medicines, herbs, non-prescription drugs, or dietary supplements you use. Also tell them if you smoke, drink alcohol, or use illegal drugs. Some items may interact with your medicine. What should I watch for while using this medicine? This product does not protect you against HIV infection (AIDS) or other sexually transmitted diseases. You should be able to feel the implant by pressing your fingertips over the skin where it was inserted. Contact your doctor if you cannot feel the implant, and use a non-hormonal birth control method (such as condoms) until your doctor confirms that the implant is in place. If you feel that the implant may have broken or become bent while in your arm, contact your healthcare provider. What side effects may I notice from receiving this medicine? Side effects that you should report to your doctor or health care professional as soon as possible: -allergic reactions like skin rash, itching or hives, swelling of the face, lips, or tongue -breast lumps -changes in emotions or moods -depressed mood -heavy or prolonged menstrual bleeding -pain, irritation, swelling, or bruising at the insertion site -scar at site of insertion -signs of infection at the insertion site such as fever, and skin redness, pain or discharge -signs of pregnancy -signs and symptoms of a blood clot such as breathing problems; changes in vision; chest pain; severe, sudden headache; pain, swelling, warmth in the leg; trouble speaking; sudden numbness or weakness of the face, arm or leg -signs and symptoms of liver injury like dark yellow or brown urine; general ill feeling or flu-like symptoms;  light-colored   stools; loss of appetite; nausea; right upper belly pain; unusually weak or tired; yellowing of the eyes or skin -unusual vaginal bleeding, discharge -signs and symptoms of a stroke like changes in vision; confusion; trouble speaking or understanding; severe headaches; sudden numbness or weakness of the face, arm or leg; trouble walking; dizziness; loss of balance or coordination Side effects that usually do not require medical attention (report to your doctor or health care professional if they continue or are bothersome): -acne -back pain -breast pain -changes in weight -dizziness -general ill feeling or flu-like symptoms -headache -irregular menstrual bleeding -nausea -sore throat -vaginal irritation or inflammation This list may not describe all possible side effects. Call your doctor for medical advice about side effects. You may report side effects to FDA at 1-800-FDA-1088. Where should I keep my medicine? This drug is given in a hospital or clinic and will not be stored at home. NOTE: This sheet is a summary. It may not cover all possible information. If you have questions about this medicine, talk to your doctor, pharmacist, or health care provider.  2018 Elsevier/Gold Standard (2015-08-15 11:19:22) Nexplanon Instructions After Insertion   Keep bandage clean and dry for 24 hours   May use ice/Tylenol/Ibuprofen for soreness or pain   If you develop fever, drainage or increased warmth from incision site-contact office immediately   

## 2017-01-28 LAB — BASIC METABOLIC PANEL
BUN/Creatinine Ratio: 23 (ref 9–23)
BUN: 17 mg/dL (ref 6–20)
CHLORIDE: 104 mmol/L (ref 96–106)
CO2: 22 mmol/L (ref 20–29)
CREATININE: 0.75 mg/dL (ref 0.57–1.00)
Calcium: 9.1 mg/dL (ref 8.7–10.2)
GFR calc Af Amer: 123 mL/min/{1.73_m2} (ref >59)
GFR calc non Af Amer: 107 mL/min/{1.73_m2} (ref >59)
GLUCOSE: 77 mg/dL (ref 65–99)
POTASSIUM: 4.1 mmol/L (ref 3.5–5.2)
SODIUM: 138 mmol/L (ref 134–144)

## 2017-01-28 LAB — MAGNESIUM: MAGNESIUM: 1.9 mg/dL (ref 1.6–2.3)

## 2017-02-04 ENCOUNTER — Encounter: Payer: Self-pay | Admitting: Obstetrics and Gynecology

## 2017-02-04 ENCOUNTER — Ambulatory Visit: Payer: Self-pay | Admitting: Obstetrics and Gynecology

## 2017-02-05 NOTE — Progress Notes (Signed)
Patient did not keep OB appointment for 02/04/2017.  Raju Coppolino, Jr MD Attending [facility] (Faculty Practice)   

## 2017-02-16 ENCOUNTER — Encounter: Payer: Self-pay | Admitting: *Deleted

## 2017-02-22 MED FILL — TRUVADA 200-300 MG TABS: 200-300 | 30 days supply | Qty: 30 | Fill #7

## 2017-02-22 MED FILL — ISENTRESS HD 600 MG TAB: 600 | 30 days supply | Qty: 60 | Fill #6

## 2017-03-26 ENCOUNTER — Telehealth: Payer: Self-pay | Admitting: Pharmacist Clinician (PhC)/ Clinical Pharmacy Specialist

## 2017-03-26 NOTE — Telephone Encounter (Signed)
She is post pregnancy now and has miss about 3 doses per month of her isentress and truvada. Set her up an app with Drue SecondSnider in March. Will bring her back next week to change her therapy. Will need to re-do ADAP.

## 2017-04-01 ENCOUNTER — Ambulatory Visit (INDEPENDENT_AMBULATORY_CARE_PROVIDER_SITE_OTHER): Payer: Medicaid Other | Admitting: Pharmacist Clinician (PhC)/ Clinical Pharmacy Specialist

## 2017-04-01 ENCOUNTER — Encounter: Payer: Self-pay | Admitting: Internal Medicine

## 2017-04-01 DIAGNOSIS — Z23 Encounter for immunization: Secondary | ICD-10-CM

## 2017-04-01 DIAGNOSIS — B20 Human immunodeficiency virus [HIV] disease: Secondary | ICD-10-CM | POA: Diagnosis not present

## 2017-04-01 LAB — BASIC METABOLIC PANEL
BUN: 11 mg/dL (ref 7–25)
CALCIUM: 8.9 mg/dL (ref 8.6–10.2)
CO2: 25 mmol/L (ref 20–32)
CREATININE: 0.64 mg/dL (ref 0.50–1.10)
Chloride: 111 mmol/L — ABNORMAL HIGH (ref 98–110)
GLUCOSE: 88 mg/dL (ref 65–99)
Potassium: 3.9 mmol/L (ref 3.5–5.3)
SODIUM: 142 mmol/L (ref 135–146)

## 2017-04-01 LAB — LIPID PANEL
CHOL/HDL RATIO: 3 (calc) (ref <5.0)
Cholesterol: 139 mg/dL (ref <200)
HDL: 46 mg/dL — ABNORMAL LOW (ref >50)
LDL CHOLESTEROL (CALC): 82 mg/dL
Non-HDL Cholesterol (Calc): 93 mg/dL (calc) (ref <130)
Triglycerides: 39 mg/dL (ref <150)

## 2017-04-01 MED ORDER — DARUN-COBIC-EMTRICIT-TENOFAF 800-150-200-10 MG PO TABS: 1.0000 | ORAL_TABLET | Freq: Every day | ORAL | 5 refills | Status: DC

## 2017-04-01 MED FILL — SYMTUZA 800-150-200-10 MG T: 800-150-200 | 30 days supply | Qty: 30 | Fill #0

## 2017-04-01 NOTE — Progress Notes (Addendum)
HPI: Catherine MccallumMarkita L Simmons is a 32 y.o. female who presents for HIV management.  Allergies: Allergies  Allergen Reactions  . Stadol [Butorphanol Tartrate] Itching    Tolerates percocet    Past Medical History: Past Medical History:  Diagnosis Date  . Anemia   . Cholecystitis 07/16/10  . Genital warts 2004  . Hemorrhoids   . HIV (human immunodeficiency virus infection) (HCC)   . Hx of pelvic inflammatory disease 08/31/2013   And hx of multiple STDs   . Hypertension   . Pregnancy induced hypertension   . Sickle cell trait (HCC)     Social History: Social History   Socioeconomic History  . Marital status: Single    Spouse name: Not on file  . Number of children: Not on file  . Years of education: Not on file  . Highest education level: Not on file  Social Needs  . Financial resource strain: Not on file  . Food insecurity - worry: Not on file  . Food insecurity - inability: Not on file  . Transportation needs - medical: Not on file  . Transportation needs - non-medical: Not on file  Occupational History  . Not on file  Tobacco Use  . Smoking status: Former Smoker    Packs/day: 0.30    Types: Cigarettes    Start date: 03/11/2012    Last attempt to quit: 12/11/2015    Years since quitting: 1.3  . Smokeless tobacco: Never Used  Substance and Sexual Activity  . Alcohol use: No    Comment: occ  . Drug use: No  . Sexual activity: Yes    Partners: Male    Birth control/protection: None  Other Topics Concern  . Not on file  Social History Narrative  . Not on file    Previous Regimen: Prezcobix/Truvada, ATV/r, Triumeq, Odefsey  Current Regimen: Isentress + Truvada  Labs: HIV 1 RNA Quant (copies/mL)  Date Value  12/17/2016 <20 NOT DETECTED  10/20/2016 <20 NOT DETECTED  06/11/2015 <20   HIV-1 RNA Viral Load  Date Value  11/19/2016 <20 copies/mL  05/15/2014 <40  04/09/2014 <40   CD4 (no units)  Date Value  04/09/2014 1,016  02/19/2014 776  12/27/2013 651    CD4 T Cell Abs (/uL)  Date Value  08/20/2016 670  07/10/2016 720  06/11/2015 870   Hep B S Ab (no units)  Date Value  10/20/2012 POS (A)   Hepatitis B Surface Ag (no units)  Date Value  07/02/2016 Negative  07/02/2016 Negative   HCV Ab (no units)  Date Value  09/28/2013 NEGATIVE    CrCl: CrCl cannot be calculated (Patient's most recent lab result is older than the maximum 21 days allowed.).  Lipids:    Component Value Date/Time   CHOL 237 12/27/2013   TRIG 61 10/20/2012 1057   HDL 50 10/20/2012 1057   CHOLHDL 2.9 10/20/2012 1057   VLDL 12 10/20/2012 1057   LDLCALC 139 12/27/2013    Assessment: Catherine Simmons is currently virologically suppressed on Isentress + Truvada. She has no complaints about the medication. She reports missing about 4 doses since she recently gave birth to her 5th child in November 2018. Catherine Simmons was previously on a PI regimen before becoming pregnant and switching to raltegravir. We will simplify her regimen today by switching to the single-tablet Symtuza. Catherine Simmons was educated on the new medication and that it is the same she was on before, but in a single tablet for convenience. She was instructed to keep taking  her current regimen until she gets her new medication in the mail. Catherine Simmons does not have immunity to HepA, so we will start this series today along with the meningococcal series.  Recommendations: -Discontinue Isentress + Truvada  -Initiate Symtuza -Administer Hep A vaccine #1/2 and meningococcal vaccine #1/2 -Obtain labs (lipid panel, BMET, VL, CD4) -Follow-up for appointment with MD in March   Jackelyn Hoehn, PharmD Candidate [facility] [facility] [employer] 04/01/2017, 2:29 PM  Agreed with [name]'s note. Due to her hx of adherence, we will change her to [facility]. Surprisingly, she still have Medicaid. Advised her that if they kick her off, we will need to do ADAP.   [name], PharmD, BCPS, AAHIVP, CPP Infectious Disease  Pharmacist Pager: [phone] 04/01/2017 2:51 PM

## 2017-04-02 LAB — T-HELPER CELL (CD4) - (RCID CLINIC ONLY)
CD4 % Helper T Cell: 38 % (ref 33–55)
CD4 T Cell Abs: 800 /uL (ref 400–2700)

## 2017-04-07 LAB — HIV RNA, RTPCR W/R GT (RTI, PI,INT)
HIV 1 RNA Quant: <20 copies/mL
HIV-1 RNA QUANT, LOG: NOT DETECTED {Log_copies}/mL

## 2017-04-28 ENCOUNTER — Inpatient Hospital Stay (HOSPITAL_COMMUNITY)
Admission: AD | Admit: 2017-04-28 | Discharge: 2017-04-28 | Disposition: A | Discharge status: Home / Self Care | Payer: Medicaid Other | Source: Ambulatory Visit | Attending: Family Medicine | Admitting: Family Medicine

## 2017-04-28 DIAGNOSIS — Z87891 Personal history of nicotine dependence: Secondary | ICD-10-CM | POA: Diagnosis not present

## 2017-04-28 DIAGNOSIS — Z79899 Other long term (current) drug therapy: Secondary | ICD-10-CM | POA: Insufficient documentation

## 2017-04-28 DIAGNOSIS — N921 Excessive and frequent menstruation with irregular cycle: Secondary | ICD-10-CM | POA: Diagnosis not present

## 2017-04-28 DIAGNOSIS — Z978 Presence of other specified devices: Secondary | ICD-10-CM

## 2017-04-28 DIAGNOSIS — D573 Sickle-cell trait: Secondary | ICD-10-CM | POA: Diagnosis not present

## 2017-04-28 DIAGNOSIS — Z793 Long term (current) use of hormonal contraceptives: Secondary | ICD-10-CM | POA: Diagnosis not present

## 2017-04-28 DIAGNOSIS — Z202 Contact with and (suspected) exposure to infections with a predominantly sexual mode of transmission: Secondary | ICD-10-CM

## 2017-04-28 DIAGNOSIS — N939 Abnormal uterine and vaginal bleeding, unspecified: Secondary | ICD-10-CM | POA: Diagnosis present

## 2017-04-28 DIAGNOSIS — I10 Essential (primary) hypertension: Secondary | ICD-10-CM | POA: Insufficient documentation

## 2017-04-28 DIAGNOSIS — R109 Unspecified abdominal pain: Secondary | ICD-10-CM | POA: Diagnosis not present

## 2017-04-28 DIAGNOSIS — B20 Human immunodeficiency virus [HIV] disease: Secondary | ICD-10-CM | POA: Diagnosis not present

## 2017-04-28 DIAGNOSIS — Z975 Presence of (intrauterine) contraceptive device: Secondary | ICD-10-CM

## 2017-04-28 LAB — CBC
HCT: 32 % — ABNORMAL LOW (ref 36.0–46.0)
Hemoglobin: 10.3 g/dL — ABNORMAL LOW (ref 12.0–15.0)
MCH: 22.7 pg — ABNORMAL LOW (ref 26.0–34.0)
MCHC: 32.2 g/dL (ref 30.0–36.0)
MCV: 70.5 fL — ABNORMAL LOW (ref 78.0–100.0)
Platelets: 272 10*3/uL (ref 150–400)
RBC: 4.54 MIL/uL (ref 3.87–5.11)
RDW: 15.7 % — ABNORMAL HIGH (ref 11.5–15.5)
WBC: 6.4 10*3/uL (ref 4.0–10.5)

## 2017-04-28 LAB — WET PREP, GENITAL
Sperm: NONE SEEN
Trich, Wet Prep: NONE SEEN
Yeast Wet Prep HPF POC: NONE SEEN

## 2017-04-28 LAB — URINALYSIS, ROUTINE W REFLEX MICROSCOPIC
Bilirubin Urine: NEGATIVE
Glucose, UA: NEGATIVE mg/dL
Ketones, ur: NEGATIVE mg/dL
Leukocytes, UA: NEGATIVE
Nitrite: NEGATIVE
Protein, ur: NEGATIVE mg/dL
Specific Gravity, Urine: 1.008 (ref 1.005–1.030)
pH: 6 (ref 5.0–8.0)

## 2017-04-28 LAB — POCT PREGNANCY, URINE: Preg Test, Ur: NEGATIVE

## 2017-04-28 LAB — GC/CHLAMYDIA PROBE AMP (~~LOC~~) NOT AT ARMC
Chlamydia: NEGATIVE
Neisseria Gonorrhea: NEGATIVE

## 2017-04-28 LAB — RPR: RPR Ser Ql: NONREACTIVE

## 2017-04-28 MED ORDER — KETOROLAC TROMETHAMINE 60 MG/2ML IM SOLN
60.0000 mg | Freq: Once | INTRAMUSCULAR | Status: AC
Start: 2017-04-28 — End: 2017-04-28
  Administered 2017-04-28: 60 mg via INTRAMUSCULAR
  Filled 2017-04-28: qty 2

## 2017-04-28 MED ORDER — NORGESTIMATE-ETH ESTRADIOL 0.25-35 MG-MCG PO TABS: 1.0000 | ORAL_TABLET | Freq: Every day | ORAL | 0 refills | Status: DC

## 2017-04-28 NOTE — MAU Provider Note (Signed)
Chief Complaint: Abdominal Pain   First Provider Initiated Contact with Patient 04/28/17 0159     SUBJECTIVE HPI: Catherine Simmons is a 32 y.o. [id number] not currently pregnant who presents to maternity admissions reporting abdominal pain and vaginal bleeding. She reports vaginal bleeding since January 2019. She reports bleeding is "constant, she is on off for a few days then back on". She delivered November 2018 and had a Nexplanon placed postpartum in December. Nexplanon currently in place in left arm. She has a hx of heavy regular bleeding prior to placement of Nexplanon. She reports abdominal cramping and pressure that started yesterday around 2100- rates pain 7/10, Took 650m of Tylenol with no relief from pain. She describes the pain as "contractions and feels like her uterus is falling out". She also reports that her Boyfriend met someone online and has been sleeping with her, she request STD testing. She reports recent IC with him and "vaginal bumps come and goes on her vagina". She denies vaginal itching/burning, urinary symptoms, h/a, dizziness, n/v, or fever/chills.  She currently is diagnosed with HIV and is being actively managed.   Past Medical History:  Diagnosis Date  . Anemia   . Cholecystitis 07/16/10  . Genital warts 2004  . Hemorrhoids   . [place]   . Hx of pelvic inflammatory disease 08/31/2013   And hx of multiple STDs   . Hypertension   . Pregnancy induced hypertension   . Sickle cell trait ([facility]    Past Surgical History:  Procedure Laterality Date  . CHOLECYSTECTOMY    . CHOLECYSTECTOMY  07/19/2010  . EXTERNAL CEPHALIC VERSION  [id number]/784     . WISDOM TOOTH EXTRACTION     Social History   Socioeconomic History  . Marital status: Single    Spouse name: Not on file  . Number of children: Not on file  . Years of education: Not on file  . Highest education level: Not on file  Social Needs  . Financial resource strain:  Not on file  . Food insecurity - worry: Not on file  . Food insecurity - inability: Not on file  . Transportation needs - medical: Not on file  . Transportation needs - non-medical: Not on file  Occupational History  . Not on file  Tobacco Use  . Smoking status: Former Smoker    Packs/day: 0.30    Types: Cigarettes    Start date: 03/11/2012    Last attempt to quit: 12/11/2015    Years since quitting: 1.3  . Smokeless tobacco: Never Used  Substance and Sexual Activity  . Alcohol use: No    Comment: occ  . Drug use: No  . Sexual activity: Yes    Partners: Male    Birth control/protection: None  Other Topics Concern  . Not on file  Social History Narrative  . Not on file   No current facility-administered medications on file prior to encounter.    Current Outpatient Medications on File Prior to Encounter  Medication Sig Dispense Refill  . amLODipine (NORVASC) 10 MG tablet Take 1 tablet (10 mg total) daily by mouth. 30 tablet 1  . Darunavir-Cobicisctat-Emtricitabine-Tenofovir Alafenamide (SYMTUZA) 800-150-200-10 MG TABS Take 1 tablet by mouth daily with supper. 30 tablet 5  . hydrochlorothiazide (HYDRODIURIL) 25 MG tablet Take 1 tablet (25 mg total) by mouth daily. 30 tablet 3  . docusate sodium (COLACE) 100 MG capsule Take 1 capsule (100 mg total) by mouth 2 (two) times daily  as needed. (Patient taking differently: Take 100 mg by mouth 2 (two) times daily as needed for mild constipation. ) 60 capsule 3   Allergies  Allergen Reactions  . Stadol [Butorphanol Tartrate] Itching    Tolerates percocet    ROS:  Review of Systems  Constitutional: Negative.   Respiratory: Negative.   Cardiovascular: Negative.   Gastrointestinal: Positive for abdominal pain. Negative for constipation, diarrhea, nausea and vomiting.  Genitourinary: Positive for genital sores, menstrual problem and vaginal bleeding. Negative for difficulty urinating, dysuria, frequency and urgency.  Musculoskeletal:  Negative.   Neurological: Negative.   Psychiatric/Behavioral: Negative.    I have reviewed patient's Past Medical Hx, Surgical Hx, Family Hx, Social Hx, medications and allergies.   Physical Exam   Patient Vitals for the past 24 hrs:  BP Temp Temp src Pulse Resp SpO2 Height Weight  04/28/17 0357 113/72 - - - - - - -  04/28/17 0144 (!) 145/97 - - 68 - - - -  04/28/17 0126 (!) 181/106 98.3 F (36.8 C) Oral 71 20 100 % '5\' 2"'  (1.575 m) 249 lb (112.9 kg)   Constitutional: Well-developed, well-nourished female in no acute distress.  Cardiovascular: normal rate Respiratory: normal effort GI: Abd soft, non-tender. Pos BS x 4 MS: Extremities nontender, no edema, normal ROM Neurologic: Alert and oriented x 4.  GU: Neg CVAT.  Vaginal exam: Pad in place, moderate amount of dark red bleeding present with no clots   Bimanual exam: Cervix 0/long/high, firm, anterior, neg CMT, uterus nontender, nonenlarged, adnexa without tenderness, enlargement, or mass   LAB RESULTS Results for orders placed or performed during the hospital encounter of 04/28/17 (from the past 24 hour(s))  Urinalysis, Routine w reflex microscopic     Status: Abnormal   Collection Time: 04/28/17  1:22 AM  Result Value Ref Range   Color, Urine YELLOW YELLOW   APPearance CLEAR CLEAR   Specific Gravity, Urine 1.008 1.005 - 1.030   pH 6.0 5.0 - 8.0   Glucose, UA NEGATIVE NEGATIVE mg/dL   Hgb urine dipstick LARGE (A) NEGATIVE   Bilirubin Urine NEGATIVE NEGATIVE   Ketones, ur NEGATIVE NEGATIVE mg/dL   Protein, ur NEGATIVE NEGATIVE mg/dL   Nitrite NEGATIVE NEGATIVE   Leukocytes, UA NEGATIVE NEGATIVE   RBC / HPF TOO NUMEROUS TO COUNT 0 - 5 RBC/hpf   WBC, UA 6-30 0 - 5 WBC/hpf   Bacteria, UA RARE (A) NONE SEEN   Squamous Epithelial / LPF 0-5 (A) NONE SEEN  Pregnancy, urine POC     Status: None   Collection Time: 04/28/17  1:38 AM  Result Value Ref Range   Preg Test, Ur NEGATIVE NEGATIVE  CBC     Status: Abnormal    Collection Time: 04/28/17  2:19 AM  Result Value Ref Range   WBC 6.4 4.0 - 10.5 K/uL   RBC 4.54 3.87 - 5.11 MIL/uL   Hemoglobin 10.3 (L) 12.0 - 15.0 g/dL   HCT 32.0 (L) 36.0 - 46.0 %   MCV 70.5 (L) 78.0 - 100.0 fL   MCH 22.7 (L) 26.0 - 34.0 pg   MCHC 32.2 30.0 - 36.0 g/dL   RDW 15.7 (H) 11.5 - 15.5 %   Platelets 272 150 - 400 K/uL  Wet prep, genital     Status: Abnormal   Collection Time: 04/28/17  3:10 AM  Result Value Ref Range   Yeast Wet Prep HPF POC NONE SEEN NONE SEEN   Trich, Wet Prep NONE SEEN NONE SEEN  Clue Cells Wet Prep HPF POC PRESENT (A) NONE SEEN   WBC, Wet Prep HPF POC FEW (A) NONE SEEN   Sperm NONE SEEN     --/--/A POS (11/08 1406)  MAU Management/MDM: Orders Placed This Encounter  Procedures  . Wet prep, genital  . HSV Culture And Typing  . Urinalysis, Routine w reflex microscopic  . CBC  . RPR  . Pregnancy, urine POC  Wet prep- positive clue cells  UA- dirty catch otherwise negative  CBC- WNL GC/C, HSV, RPR pending   Meds ordered this encounter  Medications  . ketorolac (TORADOL) injection 60 mg  . norgestimate-ethinyl estradiol (ORTHO-CYCLEN,SPRINTEC,PREVIFEM) 0.25-35 MG-MCG tablet    Sig: Take 1 tablet by mouth daily.    Dispense:  1 Package    Refill:  0    Order Specific Question:   Supervising Provider    Answer:   Truett Mainland [[id number]]    Treatments in MAU included Toradol 37m IM- pt reports decreased pain, reports continued cramping. Discussed irregular bleeding with Nexplanon , pt reports she was told it "was going to be spotting". Educated on hx of heavy bleeding prior to Nexplanon, nexplanon to most like cause irregular heavy bleeding. Instructed on adding OCP for next month to try to regular cycles. Pt discharged. Pt stable prior to discharge.   ASSESSMENT 1. Nexplanon in place   2. Breakthrough bleeding on Nexplanon   3. Possible exposure to STD   4. Abdominal cramping     PLAN Discharge home Follow up in clinic for GYN  care in 1-2 months after OCP therapy  Follow up at urgent care or health department for additional screening of STDs  Rx for Sprintec for 1 month   Allergies as of 04/28/2017      Reactions   Stadol [butorphanol Tartrate] Itching   Tolerates percocet      Medication List    TAKE these medications   amLODipine 10 MG tablet Commonly known as:  NORVASC Take 1 tablet (10 mg total) daily by mouth.   Darunavir-Cobicisctat-Emtricitabine-Tenofovir Alafenamide 800-150-200-10 MG Tabs Commonly known as:  SYMTUZA Take 1 tablet by mouth daily with supper.   docusate sodium 100 MG capsule Commonly known as:  COLACE Take 1 capsule (100 mg total) by mouth 2 (two) times daily as needed. What changed:  reasons to take this   hydrochlorothiazide 25 MG tablet Commonly known as:  HYDRODIURIL Take 1 tablet (25 mg total) by mouth daily.   norgestimate-ethinyl estradiol 0.25-35 MG-MCG tablet Commonly known as:  ORTHO-CYCLEN,SPRINTEC,PREVIFEM Take 1 tablet by mouth daily.      VDarrol Poke Certified Nurse-Midwife 04/28/2017  7:13 AM

## 2017-04-28 NOTE — MAU Note (Signed)
Pt reports lower abdominal cramping and pressure that started yesterday. States she been taking Tylenol for pain-last took at 9:30pm. Does not help. Rates pain 7/10. Pt reports vaginal bleeding-states she has been bleeding since January.

## 2017-05-03 LAB — HSV CULTURE AND TYPING

## 2017-05-05 ENCOUNTER — Ambulatory Visit: Payer: Self-pay | Admitting: Internal Medicine

## 2017-05-25 ENCOUNTER — Encounter (HOSPITAL_COMMUNITY): Payer: Self-pay | Admitting: Family Medicine

## 2017-05-25 ENCOUNTER — Ambulatory Visit (HOSPITAL_COMMUNITY)
Admission: EM | Admit: 2017-05-25 | Discharge: 2017-05-25 | Disposition: A | Discharge status: Home / Self Care | Payer: Medicaid Other | Attending: Family Medicine | Admitting: Family Medicine

## 2017-05-25 DIAGNOSIS — N644 Mastodynia: Secondary | ICD-10-CM | POA: Diagnosis not present

## 2017-05-25 NOTE — ED Triage Notes (Signed)
Pt here for pain to the inner part of her left breast x 1 week. Also reports bleeding from nipple.
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
## 2017-05-25 NOTE — Discharge Instructions (Addendum)
I have ordered you a mammogram to be done at the [facility] center [facility]. Our staff will work on referring you there for this. You will either get a call from us or from the imaging center on this mammogram.

## 2017-05-27 ENCOUNTER — Other Ambulatory Visit: Payer: Self-pay | Admitting: Nurse Practitioner

## 2017-05-27 DIAGNOSIS — N644 Mastodynia: Secondary | ICD-10-CM

## 2017-06-01 ENCOUNTER — Ambulatory Visit
Admission: RE | Admit: 2017-06-01 | Discharge: 2017-06-01 | Disposition: A | Discharge status: Home / Self Care | Payer: Medicaid Other | Source: Ambulatory Visit | Attending: Nurse Practitioner | Admitting: Nurse Practitioner

## 2017-06-01 DIAGNOSIS — N644 Mastodynia: Secondary | ICD-10-CM

## 2017-06-23 ENCOUNTER — Telehealth: Payer: Self-pay | Admitting: Pharmacist Clinician (PhC)/ Clinical Pharmacy Specialist

## 2017-06-23 NOTE — Telephone Encounter (Signed)
Catherine Simmons has lost Medicaid now so she is off of therapy. Bringing her in on Monday to do [employer] and ADAP.

## 2017-06-24 NOTE — Telephone Encounter (Signed)
Thank you. I can't understand that she is not on medicaid. I think she is a single mom with at least 3 kids. Maybe see if mitch or someone can help too.

## 2017-06-28 ENCOUNTER — Ambulatory Visit: Payer: Self-pay

## 2017-06-28 ENCOUNTER — Ambulatory Visit (INDEPENDENT_AMBULATORY_CARE_PROVIDER_SITE_OTHER): Payer: Self-pay | Admitting: Pharmacist

## 2017-06-28 DIAGNOSIS — B20 Human immunodeficiency virus [HIV] disease: Secondary | ICD-10-CM

## 2017-06-28 MED ORDER — BICTEGRAVIR-EMTRICITAB-TENOFOV 50-200-25 MG PO TABS: 1.0000 | ORAL_TABLET | Freq: Every day | ORAL | 5 refills | Status: DC

## 2017-06-28 MED ORDER — BICTEGRAVIR-EMTRICITAB-TENOFOV 50-200-25 MG PO TABS
1.0000 | ORAL_TABLET | Freq: Every day | ORAL | 5 refills | Status: DC
Start: 2017-06-28 — End: 2018-05-12

## 2017-06-28 MED FILL — BIKTARVY 50-200-25 MG TABS: 50-200-25 | 30 days supply | Qty: 30 | Fill #0
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
## 2017-06-29 ENCOUNTER — Encounter: Payer: Self-pay | Admitting: Internal Medicine

## 2017-07-02 ENCOUNTER — Telehealth: Payer: Self-pay | Admitting: Pharmacist

## 2017-07-02 NOTE — Telephone Encounter (Signed)
Patient is approved to get medication assistance for Biktarvy through [facility] until 06/28/18.

## 2017-07-26 MED FILL — BIKTARVY 50-200-25 MG TABS: 50-200-25 | 30 days supply | Qty: 30 | Fill #1

## 2017-08-11 ENCOUNTER — Ambulatory Visit: Payer: Self-pay

## 2017-09-07 ENCOUNTER — Other Ambulatory Visit: Payer: Self-pay

## 2017-09-07 ENCOUNTER — Encounter (HOSPITAL_COMMUNITY): Payer: Self-pay

## 2017-09-07 ENCOUNTER — Emergency Department (HOSPITAL_COMMUNITY)
Admission: EM | Admit: 2017-09-07 | Discharge: 2017-09-07 | Disposition: A | Discharge status: Home / Self Care | Payer: Self-pay | Attending: Emergency Medicine | Admitting: Emergency Medicine

## 2017-09-07 ENCOUNTER — Emergency Department (HOSPITAL_COMMUNITY): Payer: Self-pay

## 2017-09-07 DIAGNOSIS — Z79899 Other long term (current) drug therapy: Secondary | ICD-10-CM | POA: Insufficient documentation

## 2017-09-07 DIAGNOSIS — G43801 Other migraine, not intractable, with status migrainosus: Secondary | ICD-10-CM | POA: Insufficient documentation

## 2017-09-07 DIAGNOSIS — Z87891 Personal history of nicotine dependence: Secondary | ICD-10-CM | POA: Insufficient documentation

## 2017-09-07 DIAGNOSIS — I10 Essential (primary) hypertension: Secondary | ICD-10-CM | POA: Insufficient documentation

## 2017-09-07 LAB — BASIC METABOLIC PANEL
ANION GAP: 7 (ref 5–15)
BUN: 11 mg/dL (ref 6–20)
CO2: 24 mmol/L (ref 22–32)
Calcium: 8.7 mg/dL — ABNORMAL LOW (ref 8.9–10.3)
Chloride: 109 mmol/L (ref 98–111)
Creatinine, Ser: 0.66 mg/dL (ref 0.44–1.00)
GFR calc Af Amer: >60 mL/min (ref >60)
GLUCOSE: 81 mg/dL (ref 70–99)
Potassium: 3.5 mmol/L (ref 3.5–5.1)
SODIUM: 140 mmol/L (ref 135–145)

## 2017-09-07 LAB — CBC
HCT: 33.1 % — ABNORMAL LOW (ref 36.0–46.0)
HEMOGLOBIN: 10.6 g/dL — AB (ref 12.0–15.0)
MCH: 23.8 pg — ABNORMAL LOW (ref 26.0–34.0)
MCHC: 32 g/dL (ref 30.0–36.0)
MCV: 74.2 fL — ABNORMAL LOW (ref 78.0–100.0)
Platelets: 263 10*3/uL (ref 150–400)
RBC: 4.46 MIL/uL (ref 3.87–5.11)
RDW: 14.8 % (ref 11.5–15.5)
WBC: 4.3 10*3/uL (ref 4.0–10.5)

## 2017-09-07 LAB — I-STAT BETA HCG BLOOD, ED (MC, WL, AP ONLY): I-stat hCG, quantitative: <5 m[IU]/mL (ref <5)

## 2017-09-07 LAB — I-STAT TROPONIN, ED: TROPONIN I, POC: 0 ng/mL (ref 0.00–0.08)

## 2017-09-07 IMAGING — CT CT HEAD W/O CM
3 series · 16 of 47 positions shown, 19 images · non-contrast
Comparison: CT head [DATE]

CLINICAL DATA: Worst headache of life

EXAM:
CT HEAD WITHOUT CONTRAST
TECHNIQUE: Contiguous axial images were obtained from the base of the skull
through the vertex without intravenous contrast.

[Series 2: head wo · axial · 0.47mm/px · z∈[+1089,+1219]mm · 10 of 32 slices shown, 13 images]
[im 3/32  brain]
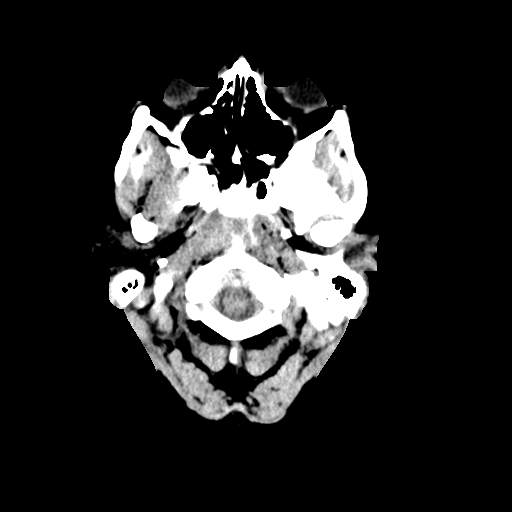
[im 3/32  bone]
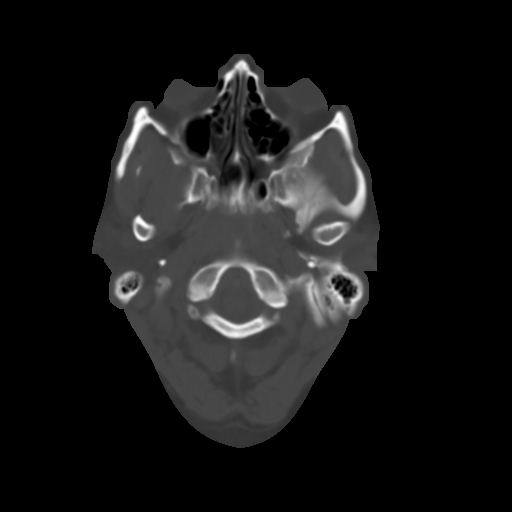
[im 6/32  brain]
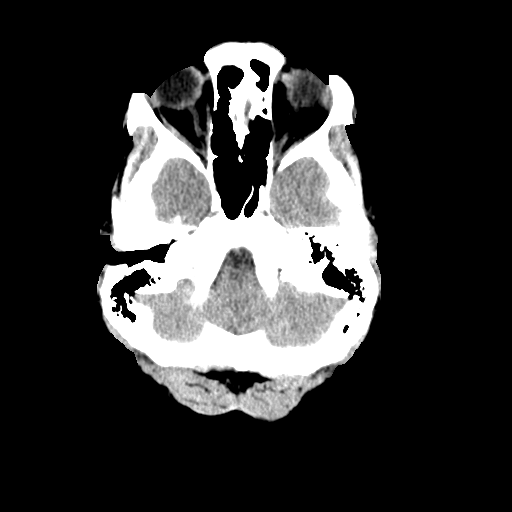
[im 9/32  brain]
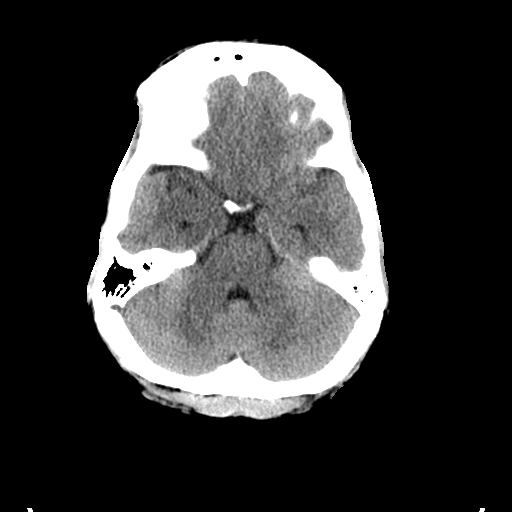
[im 11/32  brain]
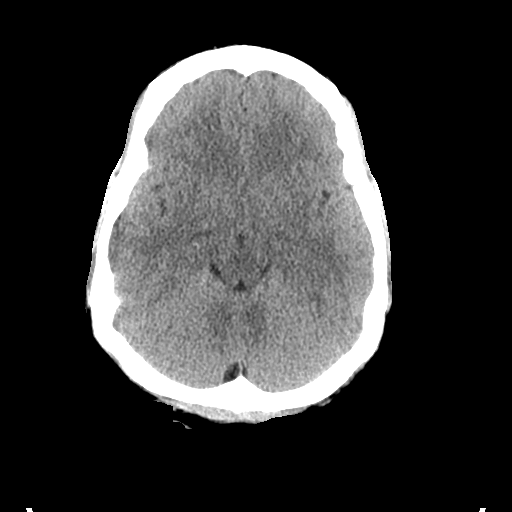
[im 14/32  brain]
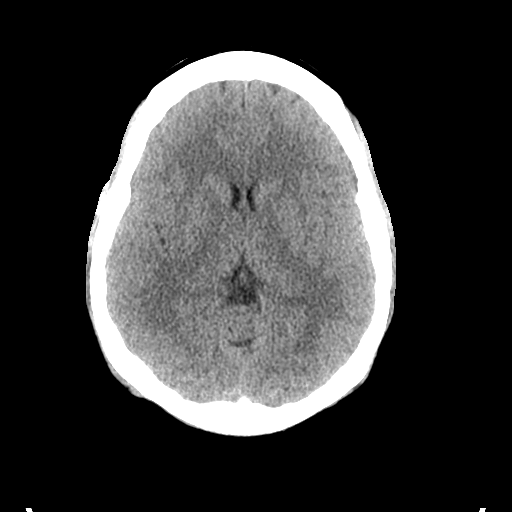
[im 14/32  bone]
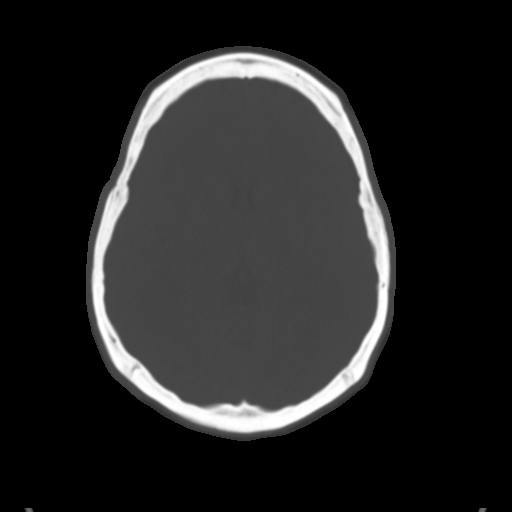
[im 18/32  brain]
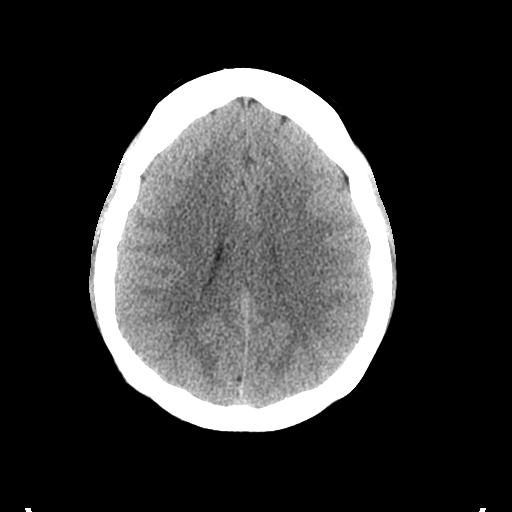
[im 21/32  brain]
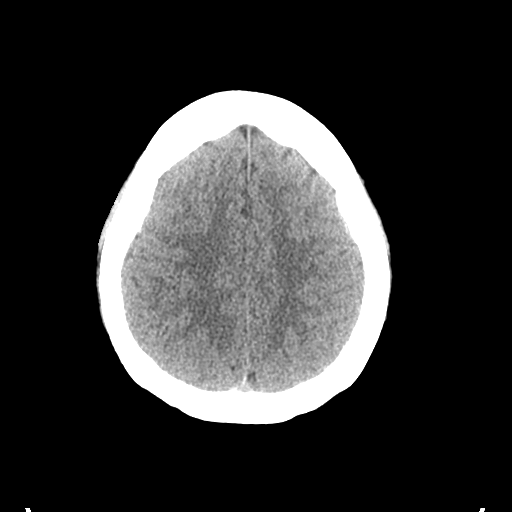
[im 24/32  brain]
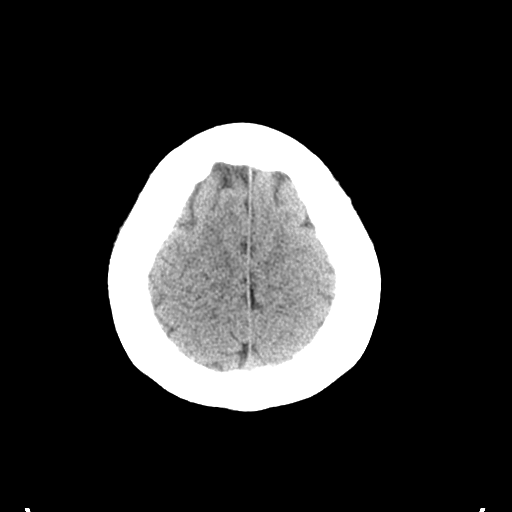
[im 26/32  brain]
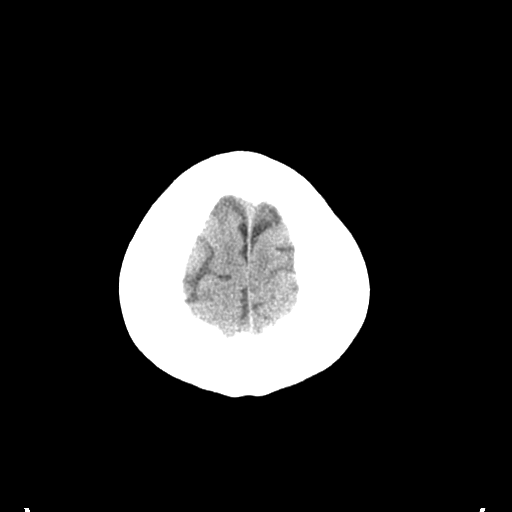
[im 26/32  bone]
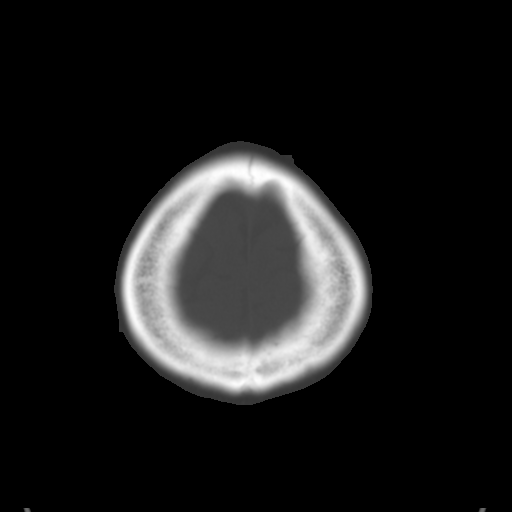
[im 29/32  brain]
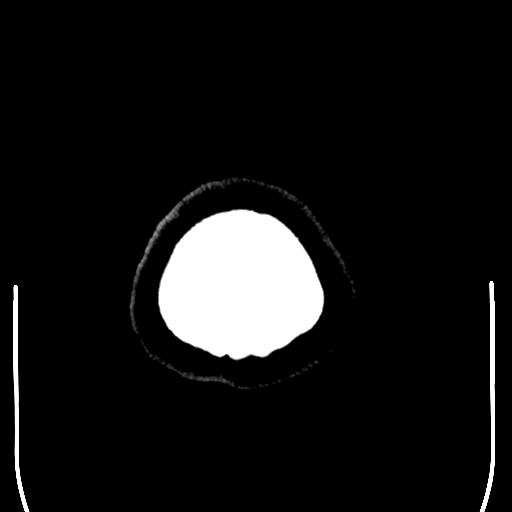

[Series 4: coronal soft tissue · coronal · 0.31mm/px · 3 of 63 slices shown]
[im 21/63  brain]
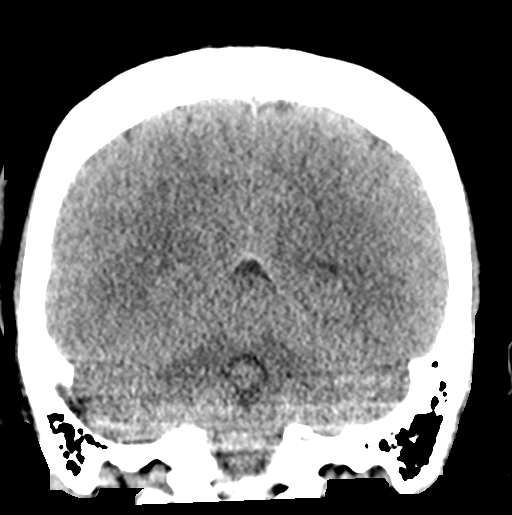
[im 28/63  brain]
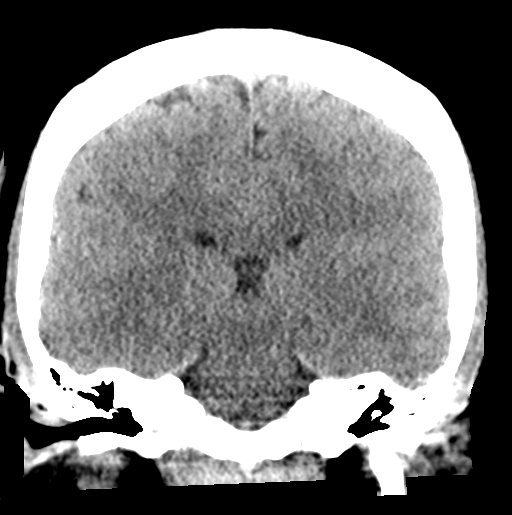
[im 35/63  brain]
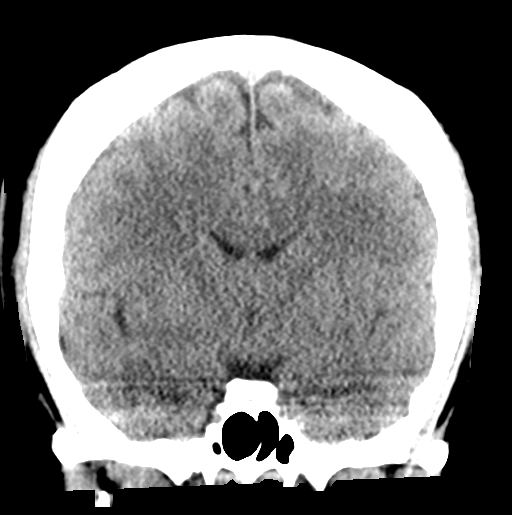

[Series 5: sagittal soft tissue · sagittal · 0.31mm/px · 3 of 53 slices shown]
[im 18/53  brain]
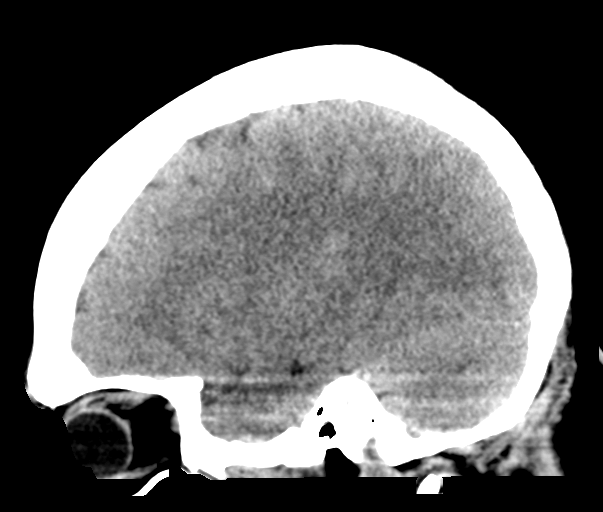
[im 27/53  brain]
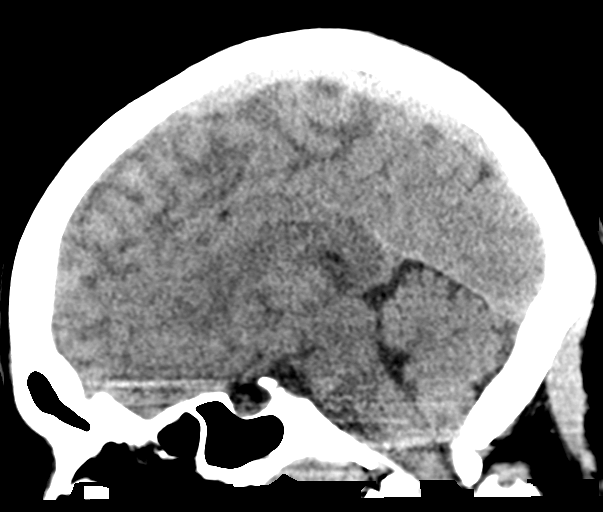
[im 35/53  brain]
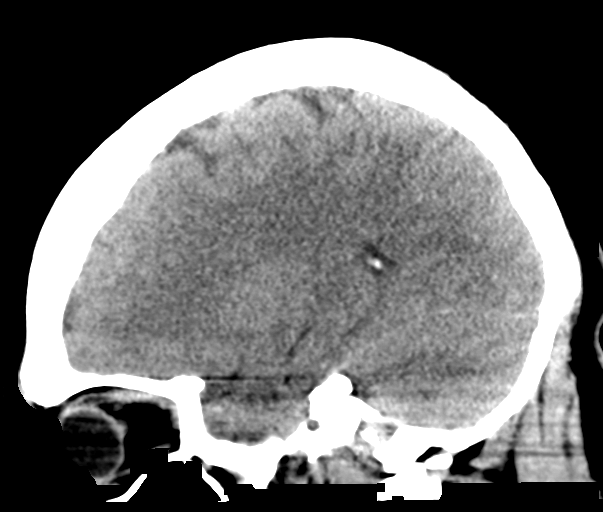

[16 of 47 positions shown; findings below may reference images not displayed]

FINDINGS: Brain: No evidence of acute infarction, hemorrhage, hydrocephalus,
extra-axial collection or mass lesion/mass effect.

Vascular: Negative for hyperdense vessel

Skull: Negative

Sinuses/Orbits: Negative

Other: None
IMPRESSION: Negative CT head

## 2017-09-07 IMAGING — DX DG CHEST 2V
2 series · 2 of 2 positions shown · non-contrast
Comparison: [DATE] chest radiograph

CLINICAL DATA: 32 y/o  F; chest pain radiating to the back.

EXAM:
CHEST - 2 VIEW

[chest pa]
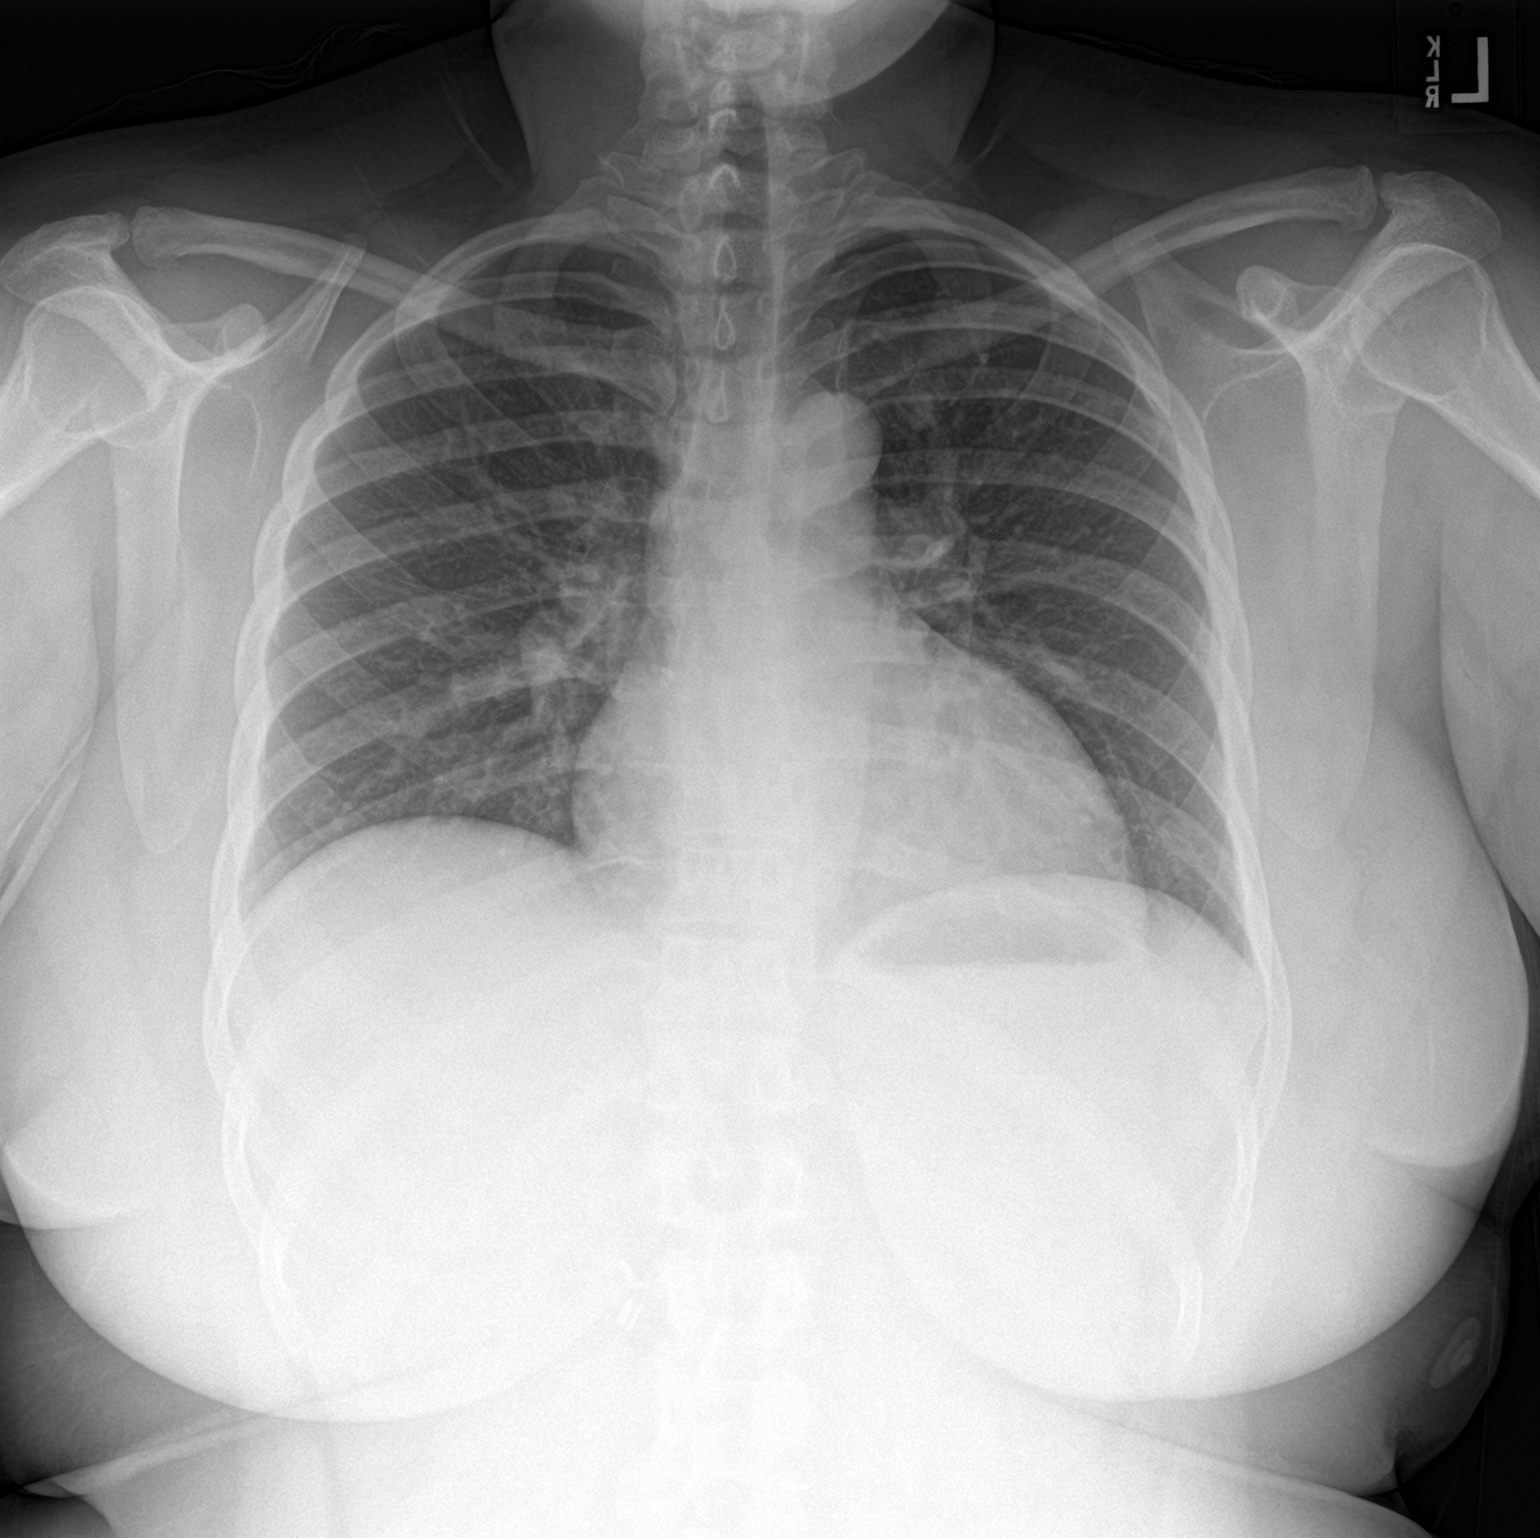

[chest lat]
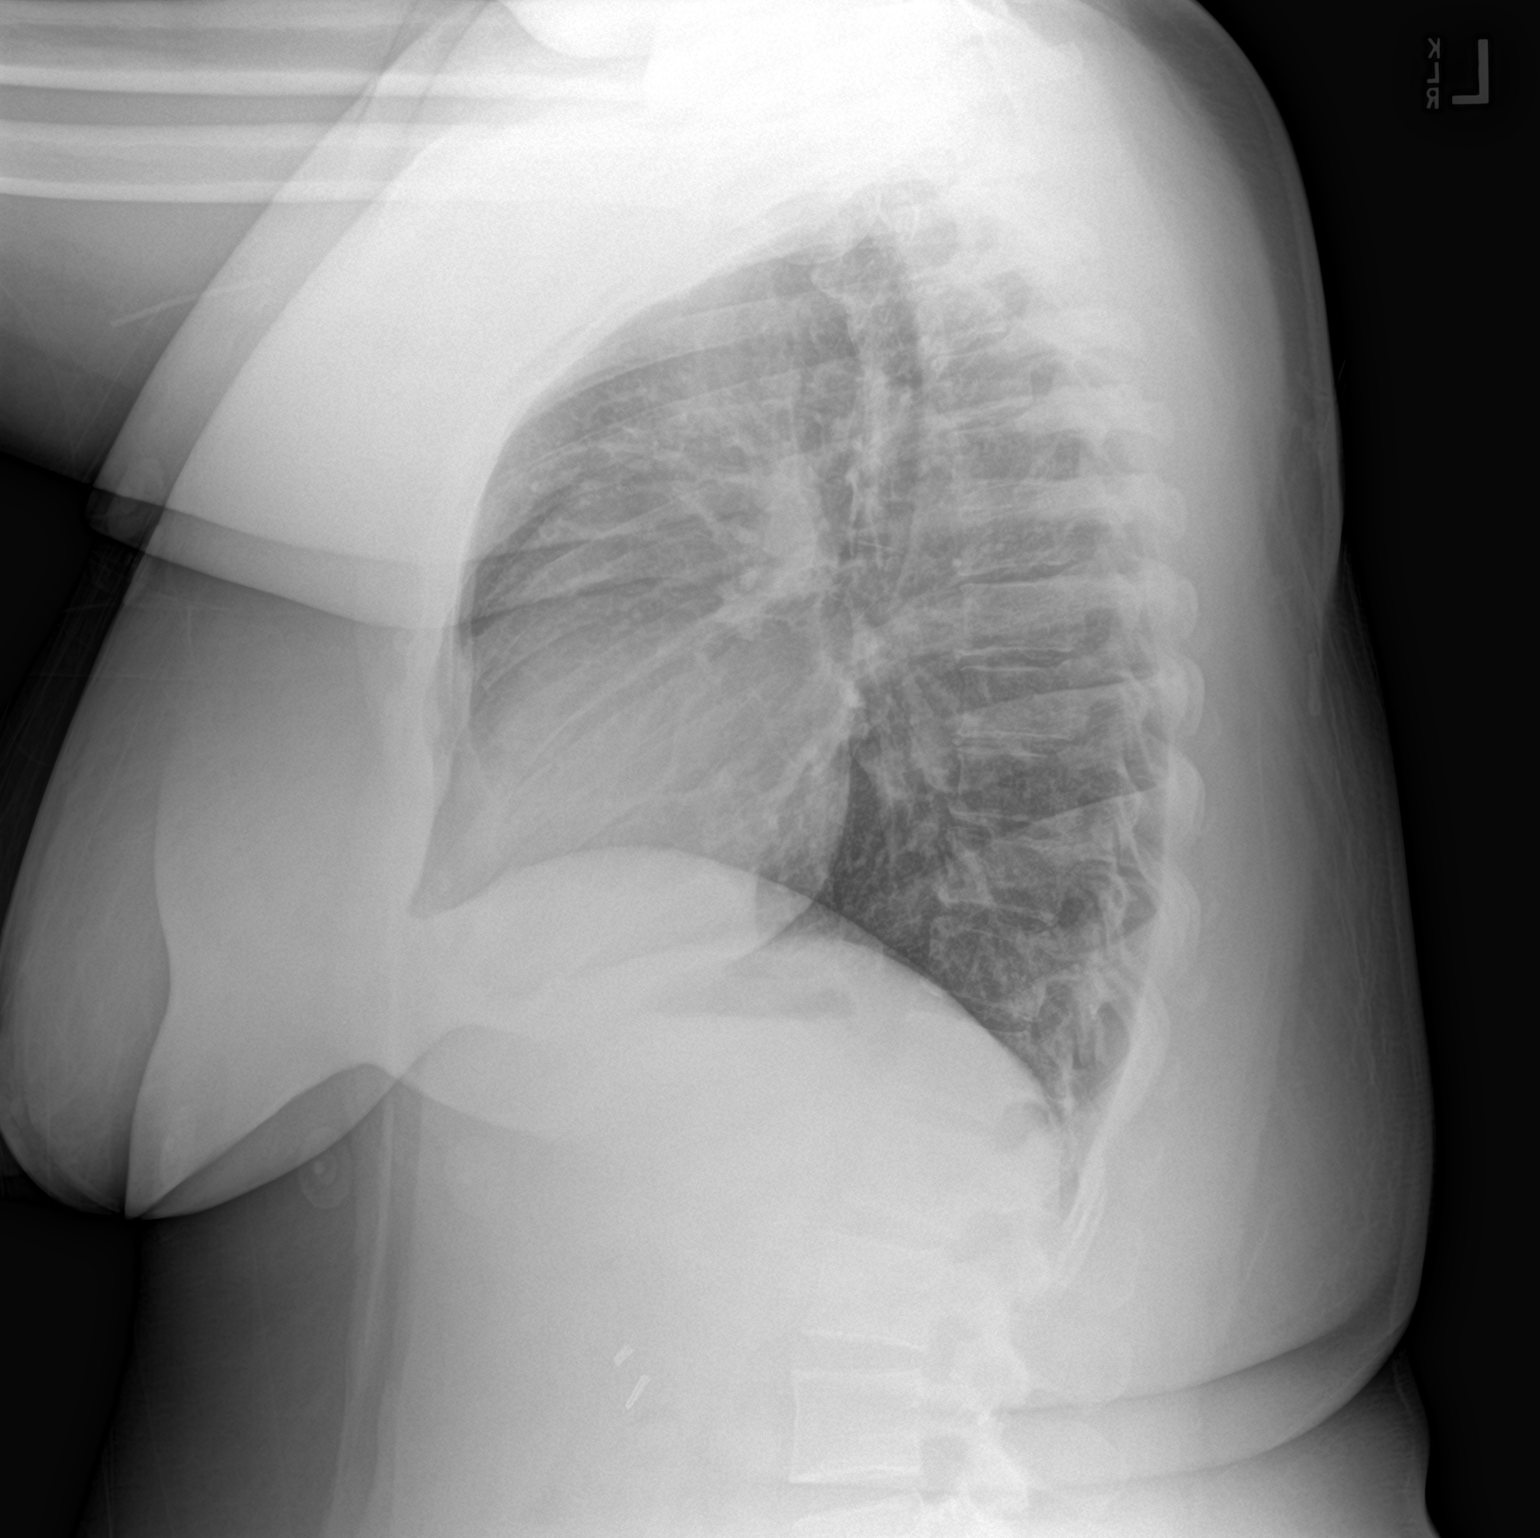

[2 of 2 positions shown; findings below may reference images not displayed]

FINDINGS: Stable heart size and mediastinal contours are within normal limits.
Both lungs are clear. The visualized skeletal structures are
unremarkable. Right upper quadrant cholecystectomy clips.
IMPRESSION: No acute pulmonary process identified.

By: SAMUKA M.D.

## 2017-09-07 MED ORDER — SODIUM CHLORIDE 0.9 % IV SOLN
1000.0000 mL | INTRAVENOUS | Status: DC
  Administered 2017-09-07: 1000 mL via INTRAVENOUS

## 2017-09-07 MED ORDER — SODIUM CHLORIDE 0.9 % IV BOLUS (SEPSIS)
500.0000 mL | Freq: Once | INTRAVENOUS | Status: AC
  Administered 2017-09-07: 500 mL via INTRAVENOUS

## 2017-09-07 MED ORDER — ETODOLAC 300 MG PO CAPS: 300.0000 mg | ORAL_CAPSULE | Freq: Three times a day (TID) | ORAL | 0 refills | Status: DC

## 2017-09-07 MED ORDER — PROCHLORPERAZINE EDISYLATE 10 MG/2ML IJ SOLN
10.0000 mg | Freq: Once | INTRAMUSCULAR | Status: AC
  Administered 2017-09-07: 10 mg via INTRAVENOUS
  Filled 2017-09-07: qty 2

## 2017-09-07 MED ORDER — DIPHENHYDRAMINE HCL 50 MG/ML IJ SOLN
25.0000 mg | Freq: Once | INTRAMUSCULAR | Status: AC
  Administered 2017-09-07: 25 mg via INTRAVENOUS
  Filled 2017-09-07: qty 1

## 2017-09-07 MED ORDER — SUMATRIPTAN SUCCINATE 50 MG PO TABS: 50.0000 mg | ORAL_TABLET | ORAL | 0 refills | Status: DC | PRN

## 2017-09-07 MED ORDER — HYDROMORPHONE HCL 1 MG/ML IJ SOLN
1.0000 mg | Freq: Once | INTRAMUSCULAR | Status: AC
  Administered 2017-09-07: 1 mg via INTRAVENOUS
  Filled 2017-09-07: qty 1

## 2017-09-07 MED ORDER — KETOROLAC TROMETHAMINE 30 MG/ML IJ SOLN
30.0000 mg | Freq: Once | INTRAMUSCULAR | Status: AC
  Administered 2017-09-07: 30 mg via INTRAVENOUS
  Filled 2017-09-07: qty 1

## 2017-09-07 NOTE — ED Triage Notes (Signed)
Pt presents with c/o chest pain and headache. Pt reports the chest pain started today, left side of her chest radiating to her back. Pt also c/o headache that started 2 days ago with associated dizziness.

## 2017-09-07 NOTE — Discharge Instructions (Addendum)
Take the medications as prescribed, follow-up with your primary care doctor, return as needed for worsening symptoms
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
## 2017-09-29 ENCOUNTER — Ambulatory Visit: Payer: Self-pay

## 2017-10-06 ENCOUNTER — Ambulatory Visit: Payer: Self-pay

## 2017-11-09 ENCOUNTER — Telehealth: Payer: Self-pay | Admitting: Family Medicine

## 2017-11-09 NOTE — Telephone Encounter (Signed)
Made appt w pt.

## 2017-11-23 ENCOUNTER — Ambulatory Visit: Payer: Self-pay | Admitting: Family Medicine

## 2017-11-29 ENCOUNTER — Ambulatory Visit (HOSPITAL_COMMUNITY)
Admission: EM | Admit: 2017-11-29 | Discharge: 2017-11-29 | Disposition: A | Discharge status: Home / Self Care | Payer: Self-pay | Attending: Family Medicine | Admitting: Family Medicine

## 2017-11-29 ENCOUNTER — Encounter (HOSPITAL_COMMUNITY): Payer: Self-pay | Admitting: *Deleted

## 2017-11-29 ENCOUNTER — Other Ambulatory Visit: Payer: Self-pay

## 2017-11-29 DIAGNOSIS — L739 Follicular disorder, unspecified: Secondary | ICD-10-CM

## 2017-11-29 DIAGNOSIS — R591 Generalized enlarged lymph nodes: Secondary | ICD-10-CM

## 2017-11-29 MED ORDER — IBUPROFEN 800 MG PO TABS: 800.0000 mg | ORAL_TABLET | Freq: Three times a day (TID) | ORAL | 0 refills | Status: DC

## 2017-11-29 MED ORDER — SULFAMETHOXAZOLE-TRIMETHOPRIM 800-160 MG PO TABS: 1.0000 | ORAL_TABLET | Freq: Two times a day (BID) | ORAL | 0 refills | Status: AC

## 2017-11-29 NOTE — ED Triage Notes (Signed)
States she noticed a knot on the right side of her neck onset Sat. C/o pain in her left ear. Denies sore throat.
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
## 2017-11-29 NOTE — Discharge Instructions (Signed)
Take the antibiotic 2 x a day for 7 days Take the ibuprofen 3 x a day with food See your doctor if fails to improve in a week

## 2017-12-30 ENCOUNTER — Encounter: Payer: Self-pay | Admitting: Family Medicine

## 2017-12-30 ENCOUNTER — Ambulatory Visit (INDEPENDENT_AMBULATORY_CARE_PROVIDER_SITE_OTHER): Payer: Self-pay | Admitting: Family Medicine

## 2017-12-30 VITALS — BP 134/90 | HR 65 | Temp 98.1°F | Wt 260.0 lb

## 2017-12-30 DIAGNOSIS — R5383 Other fatigue: Secondary | ICD-10-CM

## 2017-12-30 DIAGNOSIS — D649 Anemia, unspecified: Secondary | ICD-10-CM

## 2017-12-30 DIAGNOSIS — Z23 Encounter for immunization: Secondary | ICD-10-CM

## 2017-12-30 NOTE — Patient Instructions (Signed)
It was nice seeing you again today.  You were seen in clinic for a routine physical and to discuss your vaginal bleeding.  I have ordered some blood work and iron studies today.  I will give you a call once the results of this return.  You also received your flu shot today.  Please call clinic if you have any questions.  Freddrick MarchYashika Earlie Arciga MD

## 2017-12-30 NOTE — Progress Notes (Signed)
   Subjective:   Patient ID: Catherine Simmons    DOB: 08/02/1985, 32 y.o. female   MRN: [id number]  CC: routine physical, feeling tired   HPI: Catherine Simmons is a 32 y.o. female who presents to clinic today for routine physical.   Feeling tired Patient reports long history of feeling tired.  Denies leg swelling, chest pain and shortness of breath.  She states she has been having some vaginal bleeding since she delivered her baby last year. Bleeding is sometimes liquid and sometimes in clots. She has pictures on her phone.  Blood is bright red.   Some days goes through 5-6 pads.  She states she is sure this is vaginal and not rectal bleeding. Has a Nexplanon in since 6 weeks postpartum.  Does not take iron supplementation.  No dizziness, lightheadedness, SOB or palpitations.  No other concerns at this time.  ROS: No fever, chills, nausea, vomiting.  No abdominal pain, shortness of breath.   Social: pt is an occasional smoker, 3 cigarettes a day  Medications reviewed. Objective:   BP 134/90   Pulse 65   Temp 98.1 F (36.7 C) (Oral)   Wt 260 lb (117.9 kg)   SpO2 99%   BMI 47.55 kg/m  Vitals and nursing note reviewed.  General: 32 year old female, NAD HEENT: NCAT, EOMI, PERRL, conjunctival pallor, MMM, oropharynx clear Neck: supple CV: RRR no MRG  Lungs: CTAB, normal effort  Abdomen: soft, NTND, +bs  Skin: warm, dry, no rashes or lesions, cap refill < 2 seconds Extremities: warm and well perfused, normal tone Neuro: alert, oriented x3, no focal deficits   Assessment & Plan:   32 yo female presents for routine physical and feeling tired.   Feeling tired Per chart review, has always been anemic.  Baseline Hgb appears to be 9-10. Given abnormal ongoing menstrual bleeding, will check CBC and iron panel today.  Last TSH normal in 2017, denies cold intolerance, skin and hair changes . -f/u CBC, TIBC iron panel -consider referral to gyn if continues  -consider recheck TSH    Health maintenance:  -flu shot given today  Orders Placed This Encounter  Procedures  . Flu Vaccine QUAD 36+ mos IM  . CBC  . Iron and TIBC   Freddrick MarchYashika Sheletha Bow, MD [facility]

## 2017-12-31 LAB — CBC
HEMATOCRIT: 33.5 % — AB (ref 34.0–46.6)
HEMOGLOBIN: 10.4 g/dL — AB (ref 11.1–15.9)
MCH: 23.3 pg — ABNORMAL LOW (ref 26.6–33.0)
MCHC: 31 g/dL — ABNORMAL LOW (ref 31.5–35.7)
MCV: 75 fL — ABNORMAL LOW (ref 79–97)
PLATELETS: 271 10*3/uL (ref 150–450)
RBC: 4.47 x10E6/uL (ref 3.77–5.28)
RDW: 14.4 % (ref 12.3–15.4)
WBC: 5.8 10*3/uL (ref 3.4–10.8)

## 2017-12-31 LAB — IRON AND TIBC
IRON: 25 ug/dL — AB (ref 27–159)
Iron Saturation: 9 % — CL (ref 15–55)
TIBC: 285 ug/dL (ref 250–450)
UIBC: 260 ug/dL (ref 131–425)

## 2018-01-03 DIAGNOSIS — R5383 Other fatigue: Secondary | ICD-10-CM | POA: Insufficient documentation

## 2018-01-03 NOTE — Assessment & Plan Note (Addendum)
Per chart review, has always been anemic.  Baseline Hgb appears to be 9-10. Given abnormal ongoing menstrual bleeding, will check CBC and iron panel today.  Last TSH normal in 2017, denies cold intolerance, skin and hair changes . -f/u CBC, TIBC iron panel -consider referral to gyn if continues  -consider recheck TSH

## 2018-03-17 ENCOUNTER — Emergency Department (HOSPITAL_COMMUNITY): Payer: Medicaid Other

## 2018-03-17 ENCOUNTER — Other Ambulatory Visit: Payer: Self-pay

## 2018-03-17 ENCOUNTER — Emergency Department (HOSPITAL_COMMUNITY)
Admission: EM | Admit: 2018-03-17 | Discharge: 2018-03-17 | Disposition: A | Discharge status: Home / Self Care | Payer: Medicaid Other | Attending: Emergency Medicine | Admitting: Emergency Medicine

## 2018-03-17 DIAGNOSIS — Y939 Activity, unspecified: Secondary | ICD-10-CM | POA: Insufficient documentation

## 2018-03-17 DIAGNOSIS — S0990XA Unspecified injury of head, initial encounter: Secondary | ICD-10-CM | POA: Diagnosis not present

## 2018-03-17 DIAGNOSIS — F17210 Nicotine dependence, cigarettes, uncomplicated: Secondary | ICD-10-CM | POA: Insufficient documentation

## 2018-03-17 DIAGNOSIS — Y990 Civilian activity done for income or pay: Secondary | ICD-10-CM | POA: Diagnosis not present

## 2018-03-17 DIAGNOSIS — S0083XA Contusion of other part of head, initial encounter: Secondary | ICD-10-CM | POA: Diagnosis not present

## 2018-03-17 DIAGNOSIS — Y929 Unspecified place or not applicable: Secondary | ICD-10-CM | POA: Diagnosis not present

## 2018-03-17 DIAGNOSIS — Z79899 Other long term (current) drug therapy: Secondary | ICD-10-CM | POA: Insufficient documentation

## 2018-03-17 DIAGNOSIS — T148XXA Other injury of unspecified body region, initial encounter: Secondary | ICD-10-CM
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
## 2018-03-17 MED ORDER — ACETAMINOPHEN 500 MG PO TABS
1000.0000 mg | ORAL_TABLET | Freq: Once | ORAL | Status: AC
  Administered 2018-03-17: 1000 mg via ORAL
  Filled 2018-03-17: qty 2

## 2018-03-17 MED ORDER — ACETAMINOPHEN 325 MG PO TABS: 650.0000 mg | ORAL_TABLET | Freq: Once | ORAL | Status: DC

## 2018-03-17 NOTE — Discharge Instructions (Signed)

## 2018-03-17 NOTE — ED Notes (Signed)
Patient transported to X-ray 
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
## 2018-04-27 ENCOUNTER — Encounter: Payer: Self-pay | Admitting: Infectious Diseases

## 2018-04-27 ENCOUNTER — Other Ambulatory Visit: Payer: Self-pay

## 2018-04-27 ENCOUNTER — Ambulatory Visit: Payer: Self-pay

## 2018-04-27 DIAGNOSIS — B20 Human immunodeficiency virus [HIV] disease: Secondary | ICD-10-CM

## 2018-04-27 DIAGNOSIS — Z113 Encounter for screening for infections with a predominantly sexual mode of transmission: Secondary | ICD-10-CM

## 2018-04-27 DIAGNOSIS — Z79899 Other long term (current) drug therapy: Secondary | ICD-10-CM

## 2018-04-28 LAB — T-HELPER CELL (CD4) - (RCID CLINIC ONLY)
CD4 % Helper T Cell: 41 % (ref 33–55)
CD4 T CELL ABS: 1030 /uL (ref 400–2700)

## 2018-04-28 LAB — URINE CYTOLOGY ANCILLARY ONLY
Chlamydia: NEGATIVE
Neisseria Gonorrhea: NEGATIVE

## 2018-04-29 LAB — CBC WITH DIFFERENTIAL/PLATELET
Absolute Monocytes: 497 cells/uL (ref 200–950)
BASOS ABS: 43 {cells}/uL (ref 0–200)
Basophils Relative: 0.6 %
Eosinophils Absolute: 107 cells/uL (ref 15–500)
Eosinophils Relative: 1.5 %
HCT: 35.1 % (ref 35.0–45.0)
Hemoglobin: 11.2 g/dL — ABNORMAL LOW (ref 11.7–15.5)
Lymphs Abs: 2521 cells/uL (ref 850–3900)
MCH: 24.8 pg — ABNORMAL LOW (ref 27.0–33.0)
MCHC: 31.9 g/dL — AB (ref 32.0–36.0)
MCV: 77.7 fL — ABNORMAL LOW (ref 80.0–100.0)
MPV: 10.5 fL (ref 7.5–12.5)
Monocytes Relative: 7 %
Neutro Abs: 3933 cells/uL (ref 1500–7800)
Neutrophils Relative %: 55.4 %
Platelets: 264 10*3/uL (ref 140–400)
RBC: 4.52 10*6/uL (ref 3.80–5.10)
RDW: 14 % (ref 11.0–15.0)
Total Lymphocyte: 35.5 %
WBC: 7.1 10*3/uL (ref 3.8–10.8)

## 2018-04-29 LAB — COMPLETE METABOLIC PANEL WITH GFR
AG Ratio: 1.4 (calc) (ref 1.0–2.5)
ALT: 11 U/L (ref 6–29)
AST: 11 U/L (ref 10–30)
Albumin: 4 g/dL (ref 3.6–5.1)
Alkaline phosphatase (APISO): 82 U/L (ref 31–125)
BUN: 9 mg/dL (ref 7–25)
CO2: 28 mmol/L (ref 20–32)
Calcium: 8.8 mg/dL (ref 8.6–10.2)
Chloride: 107 mmol/L (ref 98–110)
Creat: 0.6 mg/dL (ref 0.50–1.10)
GFR, Est African American: 140 mL/min/{1.73_m2} (ref >=60)
GFR, Est Non African American: 121 mL/min/{1.73_m2} (ref >=60)
Globulin: 2.8 g/dL (calc) (ref 1.9–3.7)
Glucose, Bld: 95 mg/dL (ref 65–99)
Potassium: 3.8 mmol/L (ref 3.5–5.3)
Sodium: 140 mmol/L (ref 135–146)
Total Bilirubin: 0.5 mg/dL (ref 0.2–1.2)
Total Protein: 6.8 g/dL (ref 6.1–8.1)

## 2018-04-29 LAB — LIPID PANEL
Cholesterol: 153 mg/dL (ref <200)
HDL: 57 mg/dL (ref >=50)
LDL Cholesterol (Calc): 82 mg/dL (calc)
NON-HDL CHOLESTEROL (CALC): 96 mg/dL (ref <130)
Total CHOL/HDL Ratio: 2.7 (calc) (ref <5.0)
Triglycerides: 63 mg/dL (ref <150)

## 2018-04-29 LAB — HIV-1 RNA QUANT-NO REFLEX-BLD
HIV 1 RNA Quant: 4010 copies/mL — ABNORMAL HIGH
HIV-1 RNA Quant, Log: 3.6 Log copies/mL — ABNORMAL HIGH

## 2018-04-29 LAB — RPR: RPR Ser Ql: NONREACTIVE

## 2018-05-12 ENCOUNTER — Encounter: Payer: Self-pay | Admitting: Infectious Diseases

## 2018-05-12 ENCOUNTER — Ambulatory Visit (INDEPENDENT_AMBULATORY_CARE_PROVIDER_SITE_OTHER): Payer: Self-pay | Admitting: Infectious Diseases

## 2018-05-12 ENCOUNTER — Other Ambulatory Visit: Payer: Self-pay

## 2018-05-12 VITALS — BP 151/97 | HR 67 | Temp 98.4°F | Wt 267.0 lb

## 2018-05-12 DIAGNOSIS — F4321 Adjustment disorder with depressed mood: Secondary | ICD-10-CM

## 2018-05-12 DIAGNOSIS — R079 Chest pain, unspecified: Secondary | ICD-10-CM

## 2018-05-12 DIAGNOSIS — R5383 Other fatigue: Secondary | ICD-10-CM

## 2018-05-12 DIAGNOSIS — N939 Abnormal uterine and vaginal bleeding, unspecified: Secondary | ICD-10-CM

## 2018-05-12 DIAGNOSIS — B20 Human immunodeficiency virus [HIV] disease: Secondary | ICD-10-CM

## 2018-05-12 DIAGNOSIS — D500 Iron deficiency anemia secondary to blood loss (chronic): Secondary | ICD-10-CM

## 2018-05-12 MED ORDER — BICTEGRAVIR-EMTRICITAB-TENOFOV 50-200-25 MG PO TABS: 1.0000 | ORAL_TABLET | Freq: Every day | ORAL | 5 refills | Status: DC

## 2018-05-12 MED ORDER — LABETALOL HCL 200 MG PO TABS: 200.0000 mg | ORAL_TABLET | Freq: Two times a day (BID) | ORAL | 0 refills | Status: DC

## 2018-05-12 NOTE — Progress Notes (Addendum)
Name: Catherine Simmons  DOB: 08-16-1985 MRN: [id number] PCP: Freddrick March, MD    Patient Active Problem List   Diagnosis Date Noted   Abnormal uterine bleeding 05/13/2018   Iron deficiency anemia 05/13/2018   Fatigue 01/03/2018   History of severe pre-eclampsia 07/22/2016   Sickle cell trait (HCC) 07/22/2016   BMI 40.0-44.9, adult (HCC) 07/22/2016   Hidradenitis suppurativa 05/13/2016   Marijuana use 12/26/2014   Chronic hypertension 03/19/2014   Anxiety 06/30/2013   Human immunodeficiency virus (HIV) disease (HCC) 10/12/2012   CARPAL TUNNEL SYNDROME 11/14/2009   NUMMULAR ECZEMA 11/14/2009   SMOKER 05/09/2008   Adjustment disorder with depressed mood 05/09/2008     Subjective:   Chief Complaint  Patient presents with   Follow-up    restarted biktarvy after labs    HPI: Catherine Simmons is a 33 year old female with HIV disease.  She is not been seen in the office by provider since 2017.  She has had several more children since then with ages varying from 1 up to 33.  She stopped taking her Biktarvy in November around the time that her mother passed away suddenly and unexpectedly.  She has been profoundly affected by this loss and feels as if she is not had proper time to grieve.  She recently resumed taking her Biktarvy about 2 weeks ago.  Since then she has not missed a dose. Reports no complaints today suggestive of associated opportunistic infection or advancing HIV disease such as fevers, night sweats, weight loss, anorexia, cough, SOB, nausea, vomiting, diarrhea, headache, sensory changes, lymphadenopathy or oral thrush.   Her biggest concern today is fatigue.  She tells me that she has had a hard time feeling energized and motivated at home.  Her sleep she describes to be variable.  Some days she sleeps until noon other is she cannot sleep at all.  She feels in general and overall lack of desire to do things but she ultimately forces herself for the  welfare of her family.  They are very important to her.  She tells me she is had basically a continuous period for 3 months now.  She describes it to have clot formation, heavy flow.  She had a Nexplanon placed in her arm about a year ago.  Was told at one point she might need extra pills to help the bleeding to stop.  She does not currently have gynecology.  She has been off of her blood pressure medications for a while as well.  She has had some chest tightness and pain into the left arm.  These episodes occur sporadically.  She is not sure if it is associated with emotional reactions or not.  She says that stopping breathing and resting helps.  She does not smoke cigarettes.  She also has some muscle cramps in her legs and feet.  She is not sure if these are related.  She cannot tell me how often these episodes happen.  She does not know of any family history of heart disease outside of hypertension.  She has not had a Pap smear in several years.  Review of Systems  Constitutional: Negative for chills, fever and malaise/fatigue.  HENT: Negative for sore throat.   Eyes: Negative for blurred vision.  Respiratory: Positive for shortness of breath. Negative for cough.   Cardiovascular: Positive for chest pain and claudication. Negative for palpitations.  Gastrointestinal: Positive for abdominal pain (Cramping in the lower abdomen.).  Genitourinary: Negative for dysuria and hematuria.  Musculoskeletal: Negative for myalgias.  Skin: Negative for rash.  Neurological: Negative for dizziness, tingling and headaches.  Psychiatric/Behavioral: Positive for depression. The patient has insomnia.     Past Medical History:  Diagnosis Date   Anemia    Cholecystitis 07/16/10   Genital warts 2004   Hemorrhoids    HIV (human immunodeficiency virus infection) (HCC)    Hx of pelvic inflammatory disease 08/31/2013   And hx of multiple STDs    Hypertension    Pregnancy induced hypertension    Sickle  cell trait (HCC)     Outpatient Medications Prior to Visit  Medication Sig Dispense Refill   etonogestrel (NEXPLANON) 68 MG IMPL implant 1 each by Subdermal route once.     bictegravir-emtricitabine-tenofovir AF (BIKTARVY) 50-200-25 MG TABS tablet Take 1 tablet by mouth daily. 30 tablet 5   SUMAtriptan (IMITREX) 50 MG tablet Take 1 tablet (50 mg total) by mouth every 2 (two) hours as needed for migraine. May repeat in 2 hours if headache persists or recurs. (Patient not taking: Reported on 05/12/2018) 10 tablet 0   ibuprofen (ADVIL,MOTRIN) 800 MG tablet Take 1 tablet (800 mg total) by mouth 3 (three) times daily. 21 tablet 0   labetalol (NORMODYNE) 200 MG tablet Take 200 mg by mouth 2 (two) times daily.     No facility-administered medications prior to visit.      Allergies  Allergen Reactions   Stadol [Butorphanol Tartrate] Itching    Tolerates percocet    Social History   Tobacco Use   Smoking status: Light Tobacco Smoker    Packs/day: 0.30    Types: Cigarettes    Start date: 03/11/2012    Last attempt to quit: 12/11/2015    Years since quitting: 2.4   Smokeless tobacco: Never Used  Substance Use Topics   Alcohol use: No    Comment: occ   Drug use: Yes    Frequency: 5.0 times per week    Family History  Problem Relation Age of Onset   Cancer Mother    Hypertension Mother    Diabetes Mother    Pancreatitis Mother    Hypertension Father    Diabetes Maternal Aunt     Social History   Substance and Sexual Activity  Sexual Activity Not Currently   Partners: Male   Birth control/protection: None   Comment: declined condoms     Objective:   Vitals:   05/12/18 1437  BP: (!) 151/97  Pulse: 67  Temp: 98.4 F (36.9 C)  TempSrc: Oral  Weight: 267 lb (121.1 kg)   Body mass index is 48.83 kg/m.  Physical Exam Vitals signs reviewed.  Constitutional:      General: She is not in acute distress.    Appearance: She is not ill-appearing.      Comments: Seated comfortably in the chair in no distress.  She had one episode where she felt her left arm numbness, chest tightness, shortness of breath.  This heard  after discussions about her mother's passing.  HENT:     Mouth/Throat:     Mouth: Mucous membranes are moist.     Pharynx: Oropharynx is clear.  Eyes:     General: No scleral icterus.    Pupils: Pupils are equal, round, and reactive to light.  Cardiovascular:     Rate and Rhythm: Normal rate and regular rhythm.     Pulses: Normal pulses.     Heart sounds: No murmur. No friction rub.  Pulmonary:  Effort: Pulmonary effort is normal.     Breath sounds: Normal breath sounds. No wheezing or rales.  Abdominal:     General: Bowel sounds are normal. There is no distension.  Musculoskeletal:        General: No swelling.  Skin:    General: Skin is warm and dry.     Capillary Refill: Capillary refill takes less than 2 seconds.  Neurological:     General: No focal deficit present.     Mental Status: She is alert and oriented to person, place, and time.  Psychiatric:     Comments: She is tearful multiple times during the conversation.  Thought content is normal.  Avoids conversations out her mother.     Lab Results Lab Results  Component Value Date   WBC 7.1 04/27/2018   HGB 11.2 (L) 04/27/2018   HCT 35.1 04/27/2018   MCV 77.7 (L) 04/27/2018   PLT 264 04/27/2018    Lab Results  Component Value Date   CREATININE 0.60 04/27/2018   BUN 9 04/27/2018   NA 140 04/27/2018   K 3.8 04/27/2018   CL 107 04/27/2018   CO2 28 04/27/2018    Lab Results  Component Value Date   ALT 11 04/27/2018   AST 11 04/27/2018   ALKPHOS 141 (H) 12/17/2016   BILITOT 0.5 04/27/2018    Lab Results  Component Value Date   CHOL 153 04/27/2018   HDL 57 04/27/2018   LDLCALC 82 04/27/2018   TRIG 63 04/27/2018   CHOLHDL 2.7 04/27/2018   HIV 1 RNA Quant (copies/mL)  Date Value  04/27/2018 4,010 (H)  04/01/2017 <20 NOT DETECTED    12/17/2016 <20 NOT DETECTED   HIV-1 RNA Viral Load  Date Value  11/19/2016 <20 copies/mL  05/15/2014 <40  04/09/2014 <40   CD4 (no units)  Date Value  04/09/2014 1,016  02/19/2014 776  12/27/2013 651   CD4 T Cell Abs (/uL)  Date Value  04/27/2018 1,030  04/01/2017 800  08/20/2016 670     Assessment & Plan:   Problem List Items Addressed This Visit      Unprioritized   Human immunodeficiency virus (HIV) disease (HCC) - Primary (Chronic)    Ms. Kintner was off her medications for a period of a few months due to grief reaction and adjustment of her mother's sudden passing.  She is back on track now for 2 weeks with consistent use of her Biktarvy.  She is not had any side effects since resuming.  We reviewed her lab work both from previous and recent.  Not surprisingly she has detectable virus around 4000 copies.  Her CD4 count remains preserved above thousand.  I told her that after 2 weeks of medication use she may very well be nearly undetectable if not undetectable already.  I encouraged her to continue to take her medications.  And reinforced that her immune system is doing well.  She is very worried that she may have formed resistance to medications.  We will have her back in 1 month with a repeat viral load at that time.      Relevant Medications   bictegravir-emtricitabine-tenofovir AF (BIKTARVY) 50-200-25 MG TABS tablet   Adjustment disorder with depressed mood    She has not been herself since her mother's passing.  It seems like she has sheltered herself from going through the grieving process.  Whether that be from necessity to try to keep her household functioning or her personal desire to do so  I am not sure.  We connected her with Rene Kocher today for some counseling sessions to help her through this acute grief reaction.      Fatigue    Her fatigue is likely due to multiple reasons.  Depression, anxiety, insomnia, iron deficiency, constant vaginal bleeding for 3  months, viremia of HIV, uncontrolled hypertension.  I told her that our approach will be to pick these apartment at a time.  Today we will refill her blood pressure medications and get her back to see her family doctor.  She is obviously back on her Biktarvy.  I will refer her to gynecology to help manage her abnormal bleeding.  She may need to have her Nexplanon removed versus addition of estrogen.  Although I am not certain with her chest pain estrogen would be a good idea at the moment.  Sleep hygiene discussed.      Abnormal uterine bleeding    Family practice checked her iron stores back in November.  She had a very low saturation and low iron level.  She will start taking iron twice a day once in the morning once in the afternoon.  We discussed to never take with Biktarvy due to decreased absorption of her HIV medication.  Provided her with a list of iron-containing foods.  Referral to gynecology.      Relevant Orders   Ambulatory referral to Obstetrics / Gynecology   Iron deficiency anemia    Iron supplementation as discussed.         Other Visit Diagnoses    HIV disease (HCC)       Relevant Medications   bictegravir-emtricitabine-tenofovir AF (BIKTARVY) 50-200-25 MG TABS tablet     Will return in 1 month to follow-up on multiple issues and referrals.  I spent 40 minutes during encounter with greater than 50% face-to-face time with the patient discussing the above and coordinating her care.  Rexene Alberts, MSN, NP-C [facility] Pager: [phone] Office: [phone]  05/13/18  9:37 AM

## 2018-05-12 NOTE — Patient Instructions (Addendum)
It was a pleasure to meet you today. Please continue your Biktarvy 1 pill once a day. I have sent in refills for you  Your blood pressure is elevated today.  This may be contributing to some of your fatigue, chest pain.  I will refill your labetalol.  Please pick this up and start taking this.  For your vaginal bleeding we will get you in to see the women's clinic.  Your bleeding may be a side effect from the Nexplanon.  I would like for you to please make an appointment with your primary care provider.  I will make certain that they keep a close eye on your blood pressure and chest pain.  If you ever experience chest pain that will not resolve with rest, increased shortness of breath, sweating these are signs I would like you to pay attention to to go to the emergency room to be evaluated.  I think your chest pain is being driven more from emotions and not from your heart.  You had a normal EKG in the office today.  For your muscle cramps - magnesium 200 mg tablet once a day if you would like to try this.   Your Iron counts are quite low - will have you start Ferrous Sulfate 325 mg per pill twice a day.  You can pick this up at the pharmacy.  If you have questions ask the pharmacist.  Please take this 4 hours apart from your [place].  Please come back in 1 month.  We will do lab work at this visit.

## 2018-05-13 ENCOUNTER — Encounter: Payer: Self-pay | Admitting: Infectious Diseases

## 2018-05-13 DIAGNOSIS — D509 Iron deficiency anemia, unspecified: Secondary | ICD-10-CM | POA: Insufficient documentation

## 2018-05-13 DIAGNOSIS — N939 Abnormal uterine and vaginal bleeding, unspecified: Secondary | ICD-10-CM | POA: Insufficient documentation

## 2018-05-13 NOTE — Assessment & Plan Note (Signed)
Family practice checked her iron stores back in November.  She had a very low saturation and low iron level.  She will start taking iron twice a day once in the morning once in the afternoon.  We discussed to never take with Biktarvy due to decreased absorption of her HIV medication.  Provided her with a list of iron-containing foods.  Referral to gynecology.

## 2018-05-13 NOTE — Assessment & Plan Note (Signed)
EKG in the office reveals normal sinus rhythm.  I doubt this is related to ACS, although her blood pressure is out of control.  Her episodes seem to happen in a cluster around an emotional reaction.  I will refill her calcium channel blocker today in case she is having vasospasm.  Her ADAP has not been approved yet but I asked her to please pay for 1 month of this medication.  We are happy to send elsewhere if she finds a better deal on the price.  But I think this is very important for her to resume now.  She will call to make an appointment with her primary care team for further management. We discussed parameters that would require hospital or emergency room evaluation.

## 2018-05-13 NOTE — Assessment & Plan Note (Signed)
Iron supplementation as discussed.

## 2018-05-13 NOTE — Assessment & Plan Note (Signed)
She has not been herself since her mother's passing.  It seems like she has sheltered herself from going through the grieving process.  Whether that be from necessity to try to keep her household functioning or her personal desire to do so I am not sure.  We connected her with Rene Kocher today for some counseling sessions to help her through this acute grief reaction.

## 2018-05-13 NOTE — Assessment & Plan Note (Signed)
Her fatigue is likely due to multiple reasons.  Depression, anxiety, insomnia, iron deficiency, constant vaginal bleeding for 3 months, viremia of HIV, uncontrolled hypertension.  I told her that our approach will be to pick these apartment at a time.  Today we will refill her blood pressure medications and get her back to see her family doctor.  She is obviously back on her Biktarvy.  I will refer her to gynecology to help manage her abnormal bleeding.  She may need to have her Nexplanon removed versus addition of estrogen.  Although I am not certain with her chest pain estrogen would be a good idea at the moment.  Sleep hygiene discussed.

## 2018-05-13 NOTE — Assessment & Plan Note (Signed)
Catherine Simmons was off her medications for a period of a few months due to grief reaction and adjustment of her mother's sudden passing.  She is back on track now for 2 weeks with consistent use of her Biktarvy.  She is not had any side effects since resuming.  We reviewed her lab work both from previous and recent.  Not surprisingly she has detectable virus around 4000 copies.  Her CD4 count remains preserved above thousand.  I told her that after 2 weeks of medication use she may very well be nearly undetectable if not undetectable already.  I encouraged her to continue to take her medications.  And reinforced that her immune system is doing well.  She is very worried that she may have formed resistance to medications.  We will have her back in 1 month with a repeat viral load at that time.

## 2018-06-02 ENCOUNTER — Telehealth: Payer: Self-pay | Admitting: Family Medicine

## 2018-06-02 NOTE — Telephone Encounter (Signed)
Tried to contact patient, Was unable to leave a message Due to Voicemail not set up.  I was calling to inform her to download the [employer]"

## 2018-06-06 ENCOUNTER — Telehealth: Payer: Self-pay | Admitting: *Deleted

## 2018-06-06 NOTE — Telephone Encounter (Addendum)
I called Catherine Simmons to assist with downloading app for virtual visit for tomorrow. A female answered and said that was the wrong phone number. I verified the number I called was the number on file.  I called her contact number  ( Father) and was given her new number- [phone] . I called that number and left a message I am calling to remind you of your appt and to assist with downloading [employer] app to prepare for your virtual visit tomorrow. Call us if questions.

## 2018-06-07 ENCOUNTER — Encounter: Payer: Self-pay | Admitting: Obstetrics & Gynecology

## 2018-06-07 ENCOUNTER — Ambulatory Visit: Payer: Self-pay | Admitting: Obstetrics & Gynecology

## 2018-06-07 DIAGNOSIS — N939 Abnormal uterine and vaginal bleeding, unspecified: Secondary | ICD-10-CM

## 2018-06-07 NOTE — Progress Notes (Signed)
I connected with  [name] on 06/07/18 at  9:55 AM EDT by telephone and verified that I am speaking with the correct person using two identifiers.   I discussed the limitations, risks, security and privacy concerns of performing an evaluation and management service by telephone and the availability of in person appointments. I also discussed with the patient that there may be a patient responsible charge related to this service. The patient expressed understanding and agreed to proceed.  Osvaldo Human, RN 06/07/2018  10:22 AM   Pt reports pt has had constant bleeding since nexplanon placement between nickel and quarter sized and sometimes multiple at time.  Pt reports it has been constant since nexplanon placement but has periods where it gets heavier.  It will get light for 4-5 days but the rest of the month her bleeding is heavy with clots.  Pt reports changing pads d/t saturation every 2-3 hours.  Pt also reports feeling dizzy, tired, and nauseous.   Pt requests a new appointment time d/t work.  Will send message to front office staff.

## 2018-06-13 ENCOUNTER — Ambulatory Visit: Payer: Self-pay | Admitting: Infectious Diseases

## 2018-07-22 ENCOUNTER — Ambulatory Visit (INDEPENDENT_AMBULATORY_CARE_PROVIDER_SITE_OTHER): Payer: Self-pay | Admitting: Family Medicine

## 2018-07-22 ENCOUNTER — Encounter: Payer: Self-pay | Admitting: Family Medicine

## 2018-07-22 ENCOUNTER — Other Ambulatory Visit: Payer: Self-pay

## 2018-07-22 VITALS — BP 118/70 | HR 73

## 2018-07-22 DIAGNOSIS — N939 Abnormal uterine and vaginal bleeding, unspecified: Secondary | ICD-10-CM

## 2018-07-22 MED ORDER — NORGESTIMATE-ETH ESTRADIOL 0.25-35 MG-MCG PO TABS: 1.0000 | ORAL_TABLET | Freq: Every day | ORAL | 11 refills | Status: DC

## 2018-07-22 NOTE — Progress Notes (Signed)
   CC: AUB, remove nexplanon  HPI  AUB - has nexplanon placed about 6w after her last child on 12/18/2016. She has been having blood clots consistently since then. States she has bled every day this month. She has tried OCPs to help x 2 months. No other tries. No previous nexplanons. She would like to try OCPs instead, she can remember to take these with her HIV. Definitely does not desire another pregnancy right now, has 5 kids.  Smoking - smokes about 1 cigarette per day, depending on her mood.  Says she is willing to quit.   Headache - mostly in the front, had HAs as a teenager. Has had to have abortive therapy in the past. Tries tylenol for these. She tries not to take advil.   ROS: Denies CP, SOB, abdominal pain, dysuria, changes in BMs.   CC, SH/smoking status, and VS noted  Objective: BP 118/70   Pulse 73   SpO2 96%  Gen: NAD, alert, cooperative, and pleasant. HEENT: NCAT, EOMI, PERRL Ext: No edema, warm Neuro: Alert and oriented, Speech clear, No gross deficits  Nexplanon Removal Patient identified, informed consent performed, consent signed.   Appropriate time out taken. Nexplanon site identified.  Area prepped in usual sterile fashon. One ml of 1% lidocaine was used to anesthetize the area at the distal end of the implant. A small stab incision was made right beside the implant on the distal portion.  The Nexplanon rod was grasped using hemostats and removed without difficulty.  There was minimal blood loss. There were no complications.  Steri-strips were applied over the small incision.  A pressure bandage was applied to reduce any bruising.  The patient tolerated the procedure well and was given post procedure instructions.  Patient is planning to use OCPs for contraception/attempt conception.  Assessment and plan:  Abnormal uterine bleeding: Patient has had what sounds to be like persistent bleeding for the 18 months that she has had her Nexplanon.  She strongly desires for  it to be removed today.  She understands that this would leave her without contraception, and she would like to use OCPs.  She takes daily medications for HIV so she feels she would be able to be diligent with taking her OCPs.  She understands that there is an increased risk of blood clot in someone who smokes and uses estrogen-containing pills.  She says that she will stop smoking today in order to be able to safely take these medicines.  Nexplanon removed as above.  OCP sent.  Follow-up with PCP in the next month to check on how periods are doing.  Headache: No headache today.  Suggested that she work hard on hydration, sleep, regular meals and continue to use Tylenol or ibuprofen as needed.  Follow-up with PCP next month to discuss how this is going and possibly consider a prophylactic medication.  Ralene Ok, MD, PGY3 07/22/2018 4:13 PM

## 2018-07-22 NOTE — Patient Instructions (Signed)
Please remember your birth control pills. Call [place] if your bleeding doesn't improve.

## 2018-08-03 ENCOUNTER — Telehealth: Payer: Self-pay | Admitting: Infectious Diseases

## 2018-08-03 NOTE — Telephone Encounter (Signed)
COVID-19 Pre-Screening Questions: ° °Do you currently have a fever (>100 °F), chills or unexplained body aches? No  ° °Are you currently experiencing new cough, shortness of breath, sore throat, runny nose? No  °•  °Have you recently travelled outside the state of [place] in the last 14 days?no  °•  °Have you been in contact with someone that is currently pending confirmation of Covid19 testing or has been confirmed to have the Covid19 virus?  No  °

## 2018-08-04 ENCOUNTER — Ambulatory Visit (INDEPENDENT_AMBULATORY_CARE_PROVIDER_SITE_OTHER): Payer: Self-pay | Admitting: Infectious Diseases

## 2018-08-04 ENCOUNTER — Other Ambulatory Visit: Payer: Self-pay

## 2018-08-04 ENCOUNTER — Encounter: Payer: Self-pay | Admitting: Infectious Diseases

## 2018-08-04 VITALS — BP 126/79 | HR 62 | Temp 98.2°F | Wt 260.0 lb

## 2018-08-04 DIAGNOSIS — B20 Human immunodeficiency virus [HIV] disease: Secondary | ICD-10-CM

## 2018-08-04 NOTE — Patient Instructions (Addendum)
So glad you are feeling better!!  Great plan for you getting your Biktarvy in every day.   Will plan to see you back in 4 months with labs prior to your visit.   Will need to get you scheduled with financial team to re-enroll for ADAP to keep your medications covered.

## 2018-08-04 NOTE — Addendum Note (Signed)
Addended by: Ferron Callas on: 08/04/2018 05:01 PM   Modules accepted: Orders

## 2018-08-04 NOTE — Progress Notes (Addendum)
Name: Catherine Simmons  DOB: 05-Jan-1986 MRN: [id number] PCP: Lovenia Kim, MD    Patient Active Problem List   Diagnosis Date Noted  . Abnormal uterine bleeding 05/13/2018  . Iron deficiency anemia 05/13/2018  . Fatigue 01/03/2018  . History of severe pre-eclampsia 07/22/2016  . Sickle cell trait ([place]) 07/22/2016  . BMI 40.0-44.9, adult ([place]) 07/22/2016  . Hidradenitis suppurativa 05/13/2016  . Marijuana use 12/26/2014  . Chronic hypertension 03/19/2014  . Anxiety 06/30/2013  . Human immunodeficiency virus (HIV) disease ([place]) 10/12/2012  . Chest pain 05/14/2010  . CARPAL TUNNEL SYNDROME 11/14/2009  . NUMMULAR ECZEMA 11/14/2009  . SMOKER 05/09/2008  . Adjustment disorder with depressed mood 05/09/2008     Subjective:   No chief complaint on file.   HPI: Catherine Simmons is a 33 year old female with HIV disease.  She is not been seen in the office by provider since 2017.  She has had several more children since then with ages varying from 1 up to 51.  She stopped taking her Biktarvy in November around the time that her mother passed away suddenly and unexpectedly.  She has been profoundly affected by this loss and feels as if she is not had proper time to grieve.  She recently resumed taking her Biktarvy about 2 weeks ago.  Since then she has not missed a dose. Reports no complaints today suggestive of associated opportunistic infection or advancing HIV disease such as fevers, night sweats, weight loss, anorexia, cough, SOB, nausea, vomiting, diarrhea, headache, sensory changes, lymphadenopathy or oral thrush.   Her biggest concern today is fatigue.  She tells me that she has had a hard time feeling energized and motivated at home.  Her sleep she describes to be variable.  Some days she sleeps until noon other is she cannot sleep at all.  She feels in general and overall lack of desire to do things but she ultimately forces herself for the welfare of her family.  They are very  important to her.  She tells me she is had basically a continuous period for 3 months now.  She describes it to have clot formation, heavy flow.  She had a Nexplanon placed in her arm about a year ago.  Was told at one point she might need extra pills to help the bleeding to stop.  She does not currently have gynecology.  She has been off of her blood pressure medications for a while as well.  She has had some chest tightness and pain into the left arm.  These episodes occur sporadically.  She is not sure if it is associated with emotional reactions or not.  She says that stopping breathing and resting helps.  She does not smoke cigarettes.  She also has some muscle cramps in her legs and feet.  She is not sure if these are related.  She cannot tell me how often these episodes happen.  She does not know of any family history of heart disease outside of hypertension.  She has not had a Pap smear in several years.  Review of Systems  Constitutional: Negative for chills, fever and malaise/fatigue.  HENT: Negative for sore throat.   Eyes: Negative for blurred vision.  Respiratory: Positive for shortness of breath. Negative for cough.   Cardiovascular: Positive for chest pain and claudication. Negative for palpitations.  Gastrointestinal: Positive for abdominal pain (Cramping in the lower abdomen.).  Genitourinary: Negative for dysuria and hematuria.  Musculoskeletal: Negative for myalgias.  Skin: Negative  for rash.  Neurological: Negative for dizziness, tingling and headaches.  Psychiatric/Behavioral: Positive for depression. The patient has insomnia.     Past Medical History:  Diagnosis Date  . Anemia   . Cholecystitis 07/16/10  . Genital warts 2004  . Hemorrhoids   . HIV (human immunodeficiency virus infection) (HCC)   . Hx of pelvic inflammatory disease 08/31/2013   And hx of multiple STDs   . Hypertension   . Pregnancy induced hypertension   . Sickle cell trait [facility](HCC)     Outpatient  Medications Prior to Visit  Medication Sig Dispense Refill  . bictegravir-emtricitabine-tenofovir AF (BIKTARVY) 50-200-25 MG TABS tablet Take 1 tablet by mouth daily. 30 tablet 5  . norgestimate-ethinyl estradiol (ORTHO-CYCLEN) 0.25-35 MG-MCG tablet Take 1 tablet by mouth daily. 1 Package 11  . etonogestrel (NEXPLANON) 68 MG IMPL implant 1 each by Subdermal route once.     No facility-administered medications prior to visit.      Allergies  Allergen Reactions  . Stadol [Butorphanol Tartrate] Itching    Tolerates percocet    Social History   Tobacco Use  . Smoking status: Light Tobacco Smoker    Packs/day: 0.30    Types: Cigarettes    Start date: 03/11/2012    Last attempt to quit: 12/11/2015    Years since quitting: 2.6  . Smokeless tobacco: Never Used  Substance Use Topics  . Alcohol use: No    Comment: occ  . Drug use: Yes    Frequency: 5.0 times per week    Family History  Problem Relation Age of Onset  . Cancer Mother   . Hypertension Mother   . Diabetes Mother   . Pancreatitis Mother   . Hypertension Father   . Diabetes Maternal Aunt     Social History   Substance and Sexual Activity  Sexual Activity Not Currently  . Partners: Male  . Birth control/protection: Implant   Comment: declined condoms     Objective:   Vitals:   08/04/18 1551  BP: 126/79  Pulse: 62  Temp: 98.2 F (36.8 C)  TempSrc: Oral  Weight: 260 lb (117.9 kg)   Body mass index is 47.55 kg/m.  Physical Exam Vitals signs reviewed.  Constitutional:      General: She is not in acute distress.    Appearance: She is not ill-appearing.     Comments: Seated comfortably in the chair in no distress.  She had one episode where she felt her left arm numbness, chest tightness, shortness of breath.  This heard  after discussions about her mother's passing.  HENT:     Mouth/Throat:     Mouth: Mucous membranes are moist.     Pharynx: Oropharynx is clear.  Eyes:     General: No scleral  icterus.    Pupils: Pupils are equal, round, and reactive to light.  Cardiovascular:     Rate and Rhythm: Normal rate and regular rhythm.     Pulses: Normal pulses.     Heart sounds: No murmur. No friction rub.  Pulmonary:     Effort: Pulmonary effort is normal.     Breath sounds: Normal breath sounds. No wheezing or rales.  Abdominal:     General: Bowel sounds are normal. There is no distension.  Musculoskeletal:        General: No swelling.  Skin:    General: Skin is warm and dry.     Capillary Refill: Capillary refill takes less than 2 seconds.  Neurological:  General: No focal deficit present.     Mental Status: She is alert and oriented to person, place, and time.  Psychiatric:     Comments: She is tearful multiple times during the conversation.  Thought content is normal.  Avoids conversations out her mother.     Lab Results Lab Results  Component Value Date   WBC 7.1 04/27/2018   HGB 11.2 (L) 04/27/2018   HCT 35.1 04/27/2018   MCV 77.7 (L) 04/27/2018   PLT 264 04/27/2018    Lab Results  Component Value Date   CREATININE 0.60 04/27/2018   BUN 9 04/27/2018   NA 140 04/27/2018   K 3.8 04/27/2018   CL 107 04/27/2018   CO2 28 04/27/2018    Lab Results  Component Value Date   ALT 11 04/27/2018   AST 11 04/27/2018   ALKPHOS 141 (H) 12/17/2016   BILITOT 0.5 04/27/2018    Lab Results  Component Value Date   CHOL 153 04/27/2018   HDL 57 04/27/2018   LDLCALC 82 04/27/2018   TRIG 63 04/27/2018   CHOLHDL 2.7 04/27/2018   HIV 1 RNA Quant (copies/mL)  Date Value  04/27/2018 4,010 (H)  04/01/2017 <20 NOT DETECTED  12/17/2016 <20 NOT DETECTED   HIV-1 RNA Viral Load  Date Value  11/19/2016 <20 copies/mL  05/15/2014 <40  04/09/2014 <40   CD4 (no units)  Date Value  04/09/2014 1,016  02/19/2014 776  12/27/2013 651   CD4 T Cell Abs (/uL)  Date Value  04/27/2018 1,030  04/01/2017 800  08/20/2016 670     Assessment & Plan:   Problem List Items  Addressed This Visit      Unprioritized   Human immunodeficiency virus (HIV) disease (HCC) - Primary (Chronic)   Relevant Orders   HIV-1 RNA quant-no reflex-bld     Will return in 1 month to follow-up on multiple issues and referrals.  I spent 40 minutes during encounter with greater than 50% face-to-face time with the patient discussing the above and coordinating her care.  Rexene AlbertsStephanie Fintan Grater, MSN, NP-C [facility] Pager: [phone] Office: [phone]  08/04/18  4:38 PM

## 2018-08-10 LAB — HIV-1 RNA QUANT-NO REFLEX-BLD
HIV 1 RNA Quant: <20 copies/mL
HIV-1 RNA Quant, Log: <1.3 Log copies/mL

## 2018-08-15 ENCOUNTER — Telehealth: Payer: Self-pay

## 2018-08-15 NOTE — Telephone Encounter (Signed)
-----   Message from Fillmore Callas, NP sent at 08/12/2018 11:09 AM EDT ----- Please give Catherine Simmons a call to let her know that her viral load is again undetectable - excellent getting back on track with her medication.  Please encourage her to sign up for MyChart so she can see lab results in the future and send secure messages to me directly should she need anything between appointments.

## 2018-08-15 NOTE — Telephone Encounter (Signed)
Spoke with patient per Catherine Madeira, Np to review lab results. Patient was able to take my call and did not have questions about labs. Patient also agreed to signup for mychart during call. Link was sent via text. [place]

## 2018-08-18 ENCOUNTER — Ambulatory Visit: Payer: Self-pay

## 2018-11-02 ENCOUNTER — Other Ambulatory Visit: Payer: Self-pay

## 2018-11-02 ENCOUNTER — Emergency Department (HOSPITAL_COMMUNITY)
Admission: EM | Admit: 2018-11-02 | Discharge: 2018-11-02 | Disposition: A | Discharge status: Home / Self Care | Payer: Medicaid Other | Attending: Emergency Medicine | Admitting: Emergency Medicine

## 2018-11-02 ENCOUNTER — Emergency Department (HOSPITAL_COMMUNITY): Payer: Medicaid Other

## 2018-11-02 ENCOUNTER — Encounter (HOSPITAL_COMMUNITY): Payer: Self-pay | Admitting: Emergency Medicine

## 2018-11-02 DIAGNOSIS — R0789 Other chest pain: Secondary | ICD-10-CM | POA: Insufficient documentation

## 2018-11-02 DIAGNOSIS — I10 Essential (primary) hypertension: Secondary | ICD-10-CM | POA: Insufficient documentation

## 2018-11-02 DIAGNOSIS — Z21 Asymptomatic human immunodeficiency virus [HIV] infection status: Secondary | ICD-10-CM | POA: Insufficient documentation

## 2018-11-02 DIAGNOSIS — Z79899 Other long term (current) drug therapy: Secondary | ICD-10-CM | POA: Insufficient documentation

## 2018-11-02 DIAGNOSIS — F17210 Nicotine dependence, cigarettes, uncomplicated: Secondary | ICD-10-CM | POA: Insufficient documentation

## 2018-11-02 DIAGNOSIS — Z20828 Contact with and (suspected) exposure to other viral communicable diseases: Secondary | ICD-10-CM | POA: Insufficient documentation

## 2018-11-02 DIAGNOSIS — Z20822 Contact with and (suspected) exposure to covid-19: Secondary | ICD-10-CM

## 2018-11-02 DIAGNOSIS — Z793 Long term (current) use of hormonal contraceptives: Secondary | ICD-10-CM | POA: Insufficient documentation

## 2018-11-02 LAB — CBC WITH DIFFERENTIAL/PLATELET
Abs Immature Granulocytes: 0.02 10*3/uL (ref 0.00–0.07)
Basophils Absolute: 0 10*3/uL (ref 0.0–0.1)
Basophils Relative: 1 %
Eosinophils Absolute: 0.1 10*3/uL (ref 0.0–0.5)
Eosinophils Relative: 2 %
HCT: 34 % — ABNORMAL LOW (ref 36.0–46.0)
Hemoglobin: 11.6 g/dL — ABNORMAL LOW (ref 12.0–15.0)
Immature Granulocytes: 0 %
Lymphocytes Relative: 45 %
Lymphs Abs: 2.7 10*3/uL (ref 0.7–4.0)
MCH: 27.1 pg (ref 26.0–34.0)
MCHC: 34.1 g/dL (ref 30.0–36.0)
MCV: 79.4 fL — ABNORMAL LOW (ref 80.0–100.0)
Monocytes Absolute: 0.5 10*3/uL (ref 0.1–1.0)
Monocytes Relative: 8 %
Neutro Abs: 2.6 10*3/uL (ref 1.7–7.7)
Neutrophils Relative %: 44 %
Platelets: 239 10*3/uL (ref 150–400)
RBC: 4.28 MIL/uL (ref 3.87–5.11)
RDW: 13.8 % (ref 11.5–15.5)
WBC: 6 10*3/uL (ref 4.0–10.5)
nRBC: 0 % (ref 0.0–0.2)

## 2018-11-02 LAB — COMPREHENSIVE METABOLIC PANEL
ALT: 22 U/L (ref 0–44)
AST: 22 U/L (ref 15–41)
Albumin: 3.4 g/dL — ABNORMAL LOW (ref 3.5–5.0)
Alkaline Phosphatase: 74 U/L (ref 38–126)
Anion gap: 6 (ref 5–15)
BUN: 6 mg/dL (ref 6–20)
CO2: 25 mmol/L (ref 22–32)
Calcium: 8.9 mg/dL (ref 8.9–10.3)
Chloride: 105 mmol/L (ref 98–111)
Creatinine, Ser: 0.65 mg/dL (ref 0.44–1.00)
GFR calc Af Amer: >60 mL/min (ref >60)
GFR calc non Af Amer: >60 mL/min (ref >60)
Glucose, Bld: 97 mg/dL (ref 70–99)
Potassium: 3.8 mmol/L (ref 3.5–5.1)
Sodium: 136 mmol/L (ref 135–145)
Total Bilirubin: 0.4 mg/dL (ref 0.3–1.2)
Total Protein: 6.7 g/dL (ref 6.5–8.1)

## 2018-11-02 LAB — I-STAT BETA HCG BLOOD, ED (MC, WL, AP ONLY): I-stat hCG, quantitative: <5 m[IU]/mL (ref <5)

## 2018-11-02 LAB — SARS CORONAVIRUS 2 (TAT 6-24 HRS): SARS Coronavirus 2: NEGATIVE

## 2018-11-02 IMAGING — CR DG CHEST 2V
2 series · 2 of 2 positions shown · non-contrast
Comparison: Chest radiograph dated [DATE]

CLINICAL DATA: 33-year-old female with shortness of breath

EXAM:
CHEST - 2 VIEW

[chest pa]
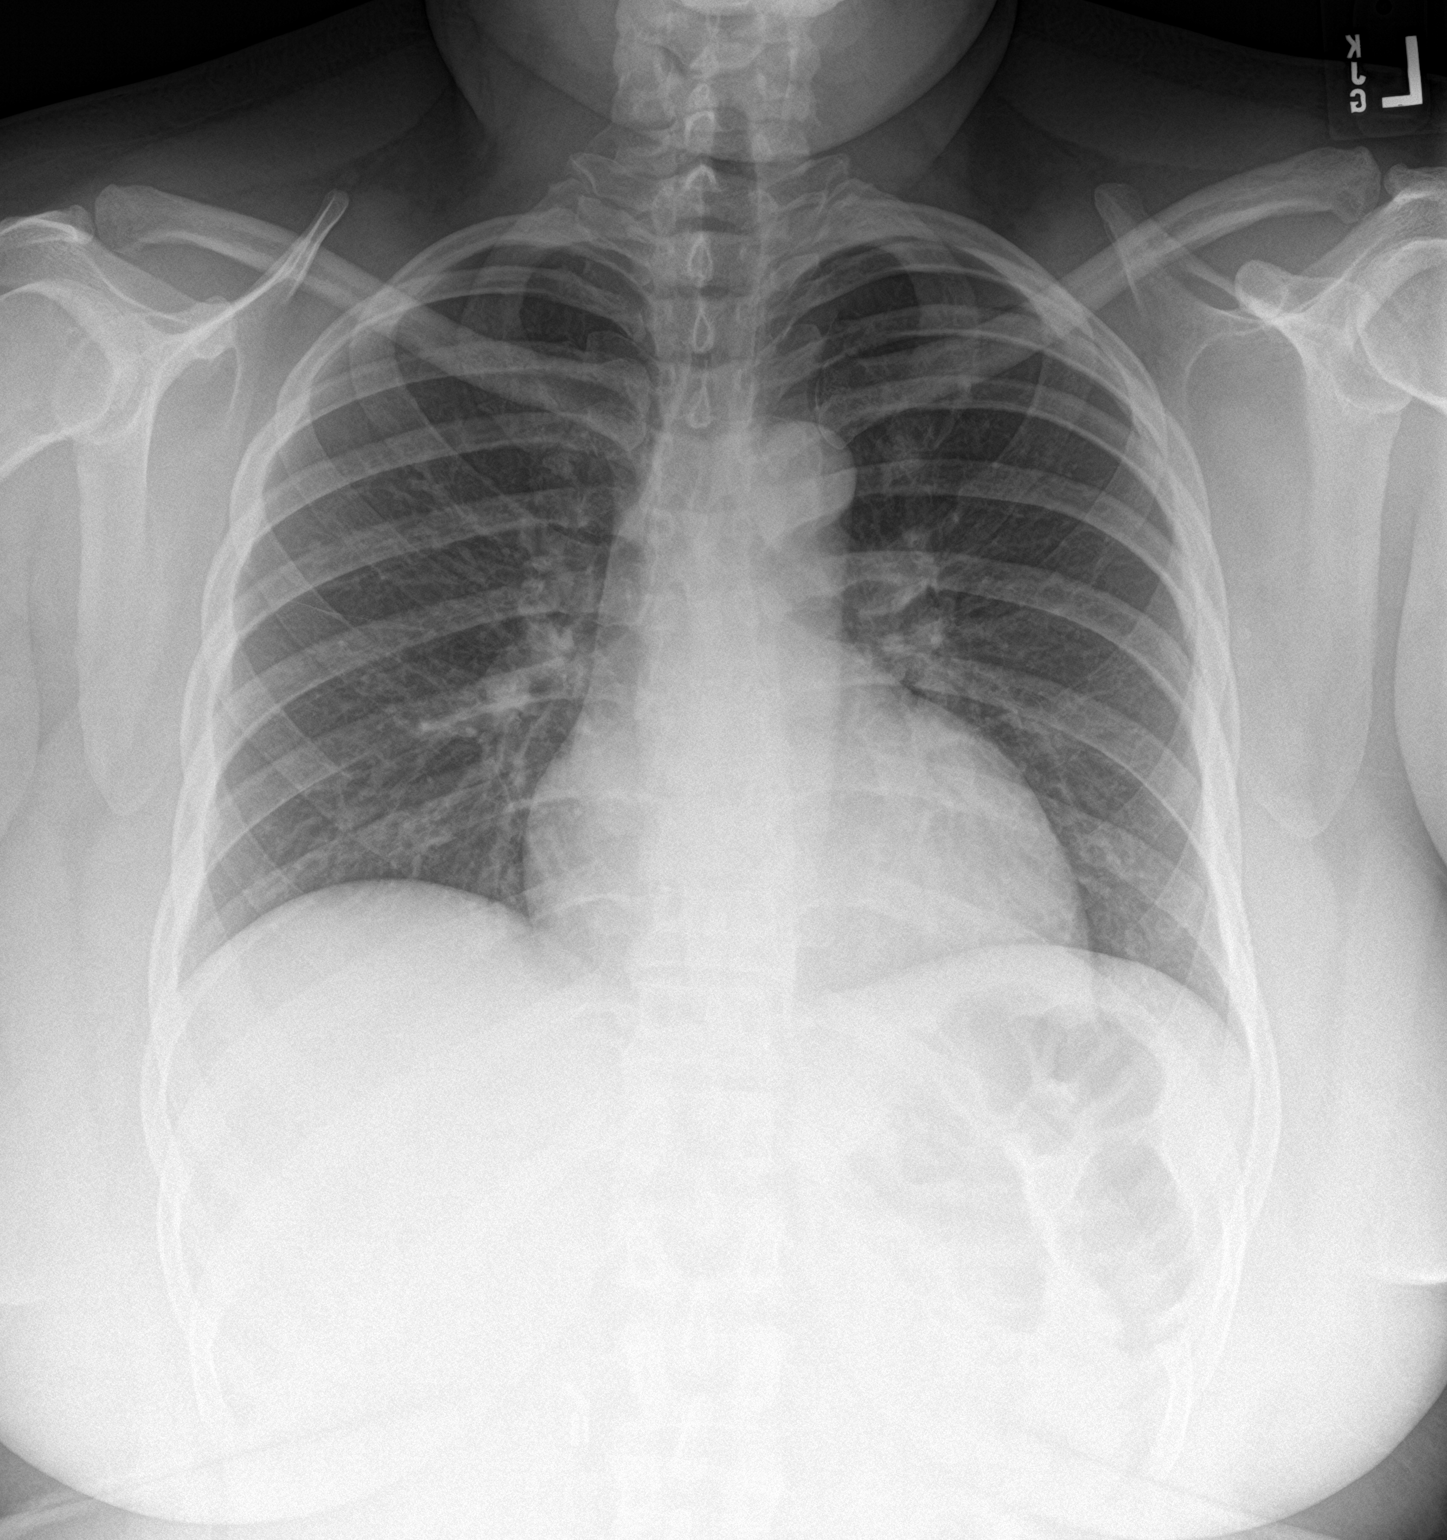

[chest lat]
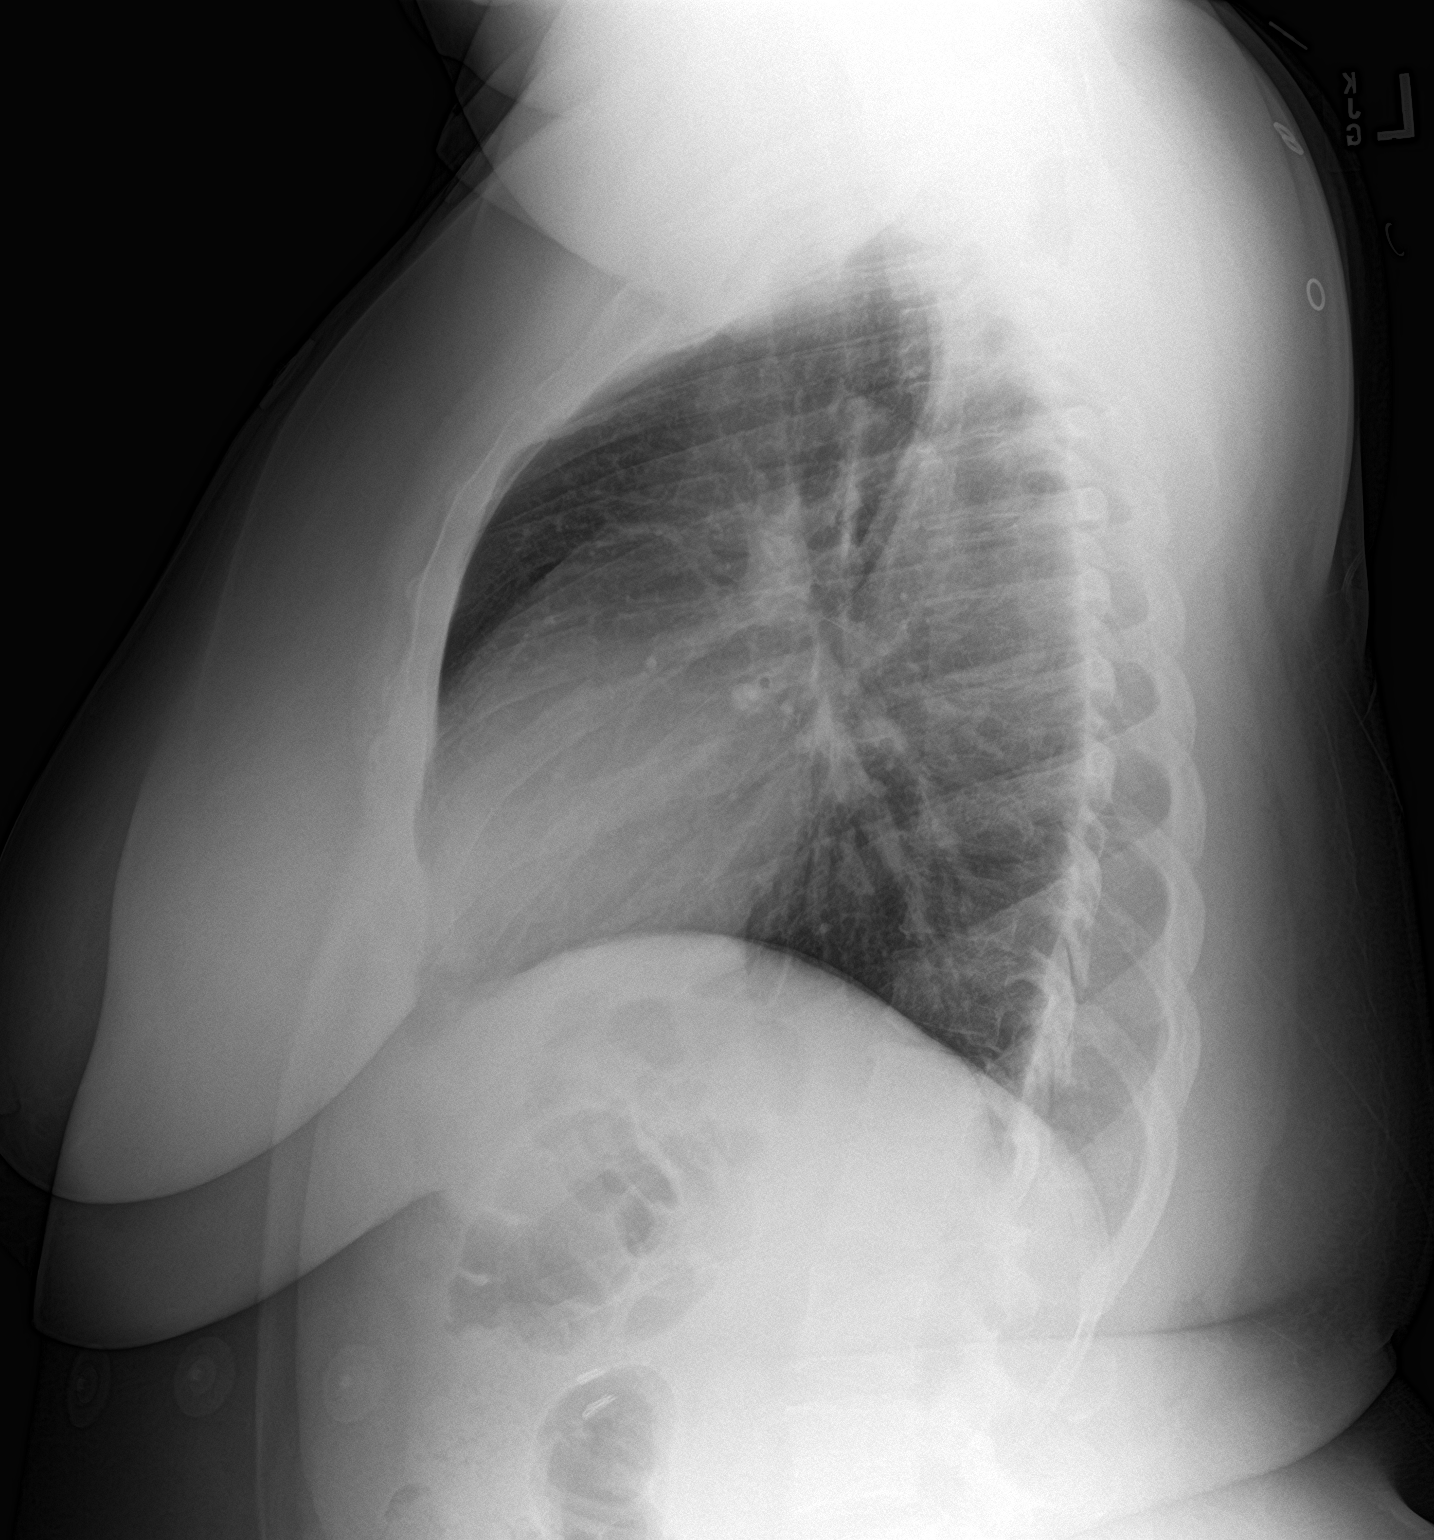

[2 of 2 positions shown; findings below may reference images not displayed]

FINDINGS: The heart size and mediastinal contours are within normal limits.
Both lungs are clear. The visualized skeletal structures are
unremarkable.
IMPRESSION: No active cardiopulmonary disease.

## 2018-11-02 MED ORDER — IBUPROFEN 800 MG PO TABS
800.0000 mg | ORAL_TABLET | Freq: Once | ORAL | Status: AC
  Administered 2018-11-02: 06:00:00 800 mg via ORAL
  Filled 2018-11-02: qty 1

## 2018-11-02 NOTE — ED Notes (Signed)
Pt verbalized understanding of discharge instructions. VSS, pt has no further questions at this time.

## 2018-11-02 NOTE — ED Notes (Signed)
Ambulated to monitor SpO2. Patient remained 98-100%.

## 2018-11-02 NOTE — ED Triage Notes (Addendum)
Pt reports she started feeling sob, having sweats and chills yesterday afternoon, woke up feeling even more sob. No known sick contacts. Pt tearful in triage.

## 2018-11-02 NOTE — ED Provider Notes (Signed)
TIME SEEN: 5:49 AM  CHIEF COMPLAINT: Shortness of breath, cough, chills, chest pain  HPI: Patient is a 33 year old female with history of hypertension, HIV with last CD4 count of 1030 and viral load of 4010 on 04/27/2018 who presents to the emergency department with chills, cough, body aches, shortness of breath and left-sided chest pain is sharp in nature.  No sick contacts.  She denies any fevers.  No vomiting or diarrhea.  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  She denies any known COVID exposures.  No history of PE, DVT, exogenous estrogen use, recent fractures, surgery, trauma, hospitalization or prolonged travel. No lower extremity swelling or pain. No calf tenderness.   ROS: See HPI Constitutional: no fever; + chills Eyes: no drainage  ENT: no runny nose   Cardiovascular:  chest pain  Resp: SOB  GI: no vomiting GU: no dysuria Integumentary: no rash  Allergy: no hives  Musculoskeletal: no leg swelling  Neurological: no slurred speech ROS otherwise negative  PAST MEDICAL HISTORY/PAST SURGICAL HISTORY:  Past Medical History:  Diagnosis Date  . Anemia   . Cholecystitis 07/16/10  . Genital warts 2004  . Hemorrhoids   . HIV (human immunodeficiency virus infection) (HCC)   . Hx of pelvic inflammatory disease 08/31/2013   And hx of multiple STDs   . Hypertension   . Pregnancy induced hypertension   . Sickle cell trait (HCC)     MEDICATIONS:  Prior to Admission medications   Medication Sig Start Date End Date Taking? Authorizing Provider  bictegravir-emtricitabine-tenofovir AF (BIKTARVY) 50-200-25 MG TABS tablet Take 1 tablet by mouth daily. 05/12/18   Blanchard Kelch, NP  etonogestrel (NEXPLANON) 68 MG IMPL implant 1 each by Subdermal route once.    [provider]  norgestimate-ethinyl estradiol (ORTHO-CYCLEN) 0.25-35 MG-MCG tablet Take 1 tablet by mouth daily. 07/22/18   Shon Hale, MD    ALLERGIES:  Allergies  Allergen Reactions   . Stadol [Butorphanol Tartrate] Itching    Tolerates percocet    SOCIAL HISTORY:  Social History   Tobacco Use  . Smoking status: Light Tobacco Smoker    Packs/day: 0.30    Types: Cigarettes    Start date: 03/11/2012    Last attempt to quit: 12/11/2015    Years since quitting: 2.8  . Smokeless tobacco: Never Used  Substance Use Topics  . Alcohol use: No    Comment: occ    FAMILY HISTORY: Family History  Problem Relation Age of Onset  . Cancer Mother   . Hypertension Mother   . Diabetes Mother   . Pancreatitis Mother   . Hypertension Father   . Diabetes Maternal Aunt     EXAM: BP (!) 188/96   Pulse (!) 52   Temp 98.5 F (36.9 C)   Resp 18   LMP 11/01/2018 (Approximate)   SpO2 100%  CONSTITUTIONAL: Alert and oriented and responds appropriately to questions. Well-appearing; well-nourished HEAD: Normocephalic EYES: Conjunctivae clear, pupils appear equal, EOMI ENT: normal nose; moist mucous membranes NECK: Supple, no meningismus, no nuchal rigidity, no LAD  CARD: RRR; S1 and S2 appreciated; no murmurs, no clicks, no rubs, no gallops CHEST: Tender to palpation over the left chest wall without redness, warmth, ecchymosis, crepitus or deformity RESP: Normal chest excursion without splinting or tachypnea; breath sounds clear and equal bilaterally; no wheezes, no rhonchi, no rales, no hypoxia or respiratory distress, speaking full sentences ABD/GI: Normal bowel sounds; non-distended; soft, non-tender, no rebound, no guarding, no peritoneal signs,  no hepatosplenomegaly BACK:  The back appears normal and is non-tender to palpation, there is no CVA tenderness EXT: Normal ROM in all joints; non-tender to palpation; no edema; normal capillary refill; no cyanosis, no calf tenderness or swelling    SKIN: Normal color for age and race; warm; no rash NEURO: Moves all extremities equally PSYCH: The patient's mood and manner are appropriate. Grooming and personal hygiene are  appropriate.  MEDICAL DECISION MAKING: Patient here with atypical chest pain and shortness of breath.  Doubt ACS or dissection.  EKG shows no ischemic abnormality.  She is PERC negative.  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  Will send outpatient COVID swab.  Will give ibuprofen and ambulate with pulse oximetry.  She has no increase of work of breathing or hypoxia at rest.  ED PROGRESS: Patient able to ambulate without difficulty and sats stayed 97% or above while walking.  Reports feeling better after ibuprofen and is resting comfortably on my reexamination.  COVID swabs pending.  Have advised her to quarantine for at least 10 days after the onset of her symptoms and that she will need to be fever free for 3 days before returning to work or coming out of quarantine.  Will provide with work note.  Discussed supportive care instructions and return precautions.  She is comfortable with this plan.  At this time, I do not feel there is any life-threatening condition present. I have reviewed and discussed all results (EKG, imaging, lab, urine as appropriate) and exam findings with patient/family. I have reviewed nursing notes and appropriate previous records.  I feel the patient is safe to be discharged home without further emergent workup and can continue workup as an outpatient as needed. Discussed usual and customary return precautions. Patient/family verbalize understanding and are comfortable with this plan.  Outpatient follow-up has been provided as needed. All questions have been answered.    EKG Interpretation  Date/Time:  Wednesday November 02 2018 01:36:32 EDT Ventricular Rate:  57 PR Interval:  164 QRS Duration: 94 QT Interval:  406 QTC Calculation: 395 R Axis:   39 Text Interpretation:  Sinus bradycardia RSR' or QR pattern in V1 suggests right ventricular conduction delay Nonspecific T wave  abnormality Abnormal ECG When compared with ECG of 09/07/2017, HEART RATE has decreased Confirmed by Delora Fuel ([id number]) on 11/02/2018 1:49:08 AM         Austynn Pridmore, Delice Bison, DO 11/02/18 [id number]

## 2018-11-02 NOTE — Discharge Instructions (Signed)
Your COVID test is pending and you may follow-up on your results in my chart.  I recommend that you quarantine for at least 10 days after the onset of symptoms and be fever free for at least 3 days before returning to work or coming out of quarantine.  Your chest x-ray today was clear.  Your labs were normal.  I recommend return to the emergency department if you develop worsening symptoms of shortness of breath, blue lips or blue fingertips, feel like you are going to pass out or do pass out, vomiting that will not stop.  I recommend that you increase your fluid intake and rest at home.  You may alternate Tylenol 1000 mg every 6 hours as needed for pain and Ibuprofen 800 mg every 8 hours as needed for pain.  Please take Ibuprofen with food.   Steps to find a Primary Care Provider (PCP):  Call [phone] or [phone] to access "[place] [facility]."  2.  You may also go on the [facility] website at [email or web address]  3.  [facility] also frequently accepts new patients.  [place] [phone]  4.  There are also multiple [facility], [facility] and [facility] practices throughout the [place] that are frequently accepting new patients. You may find a clinic that is close to your home and contact them.  [facility] [email or web address] [phone]  [facility] [email or web address]  [email or web address] [place] [email or web address] [phone]  5.  Local Health Departments also can provide primary care services.  [facility]  [place] [address] [phone]  [facility] [place] [address] [place] [facility] [place] [phone]

## 2018-12-05 ENCOUNTER — Other Ambulatory Visit: Payer: Self-pay

## 2018-12-19 ENCOUNTER — Encounter: Payer: Self-pay | Admitting: Infectious Diseases

## 2019-01-31 ENCOUNTER — Encounter: Payer: Self-pay | Admitting: Advanced Practice Midwife

## 2019-01-31 ENCOUNTER — Encounter (HOSPITAL_COMMUNITY): Payer: Self-pay | Admitting: Obstetrics and Gynecology

## 2019-01-31 ENCOUNTER — Other Ambulatory Visit: Payer: Self-pay

## 2019-01-31 ENCOUNTER — Inpatient Hospital Stay (HOSPITAL_COMMUNITY)
Admission: AD | Admit: 2019-01-31 | Discharge: 2019-01-31 | Disposition: A | Discharge status: Home / Self Care | Payer: Self-pay | Attending: Obstetrics and Gynecology | Admitting: Obstetrics and Gynecology

## 2019-01-31 ENCOUNTER — Inpatient Hospital Stay (HOSPITAL_COMMUNITY): Payer: Self-pay

## 2019-01-31 DIAGNOSIS — Z20822 Contact with and (suspected) exposure to covid-19: Secondary | ICD-10-CM

## 2019-01-31 DIAGNOSIS — O99331 Smoking (tobacco) complicating pregnancy, first trimester: Secondary | ICD-10-CM | POA: Insufficient documentation

## 2019-01-31 DIAGNOSIS — Z3490 Encounter for supervision of normal pregnancy, unspecified, unspecified trimester: Secondary | ICD-10-CM

## 2019-01-31 DIAGNOSIS — R1011 Right upper quadrant pain: Secondary | ICD-10-CM

## 2019-01-31 DIAGNOSIS — Z3491 Encounter for supervision of normal pregnancy, unspecified, first trimester: Secondary | ICD-10-CM

## 2019-01-31 DIAGNOSIS — O98711 Human immunodeficiency virus [HIV] disease complicating pregnancy, first trimester: Secondary | ICD-10-CM | POA: Insufficient documentation

## 2019-01-31 DIAGNOSIS — Z885 Allergy status to narcotic agent status: Secondary | ICD-10-CM | POA: Insufficient documentation

## 2019-01-31 DIAGNOSIS — D573 Sickle-cell trait: Secondary | ICD-10-CM | POA: Insufficient documentation

## 2019-01-31 DIAGNOSIS — R1031 Right lower quadrant pain: Secondary | ICD-10-CM | POA: Insufficient documentation

## 2019-01-31 DIAGNOSIS — O26891 Other specified pregnancy related conditions, first trimester: Secondary | ICD-10-CM | POA: Insufficient documentation

## 2019-01-31 DIAGNOSIS — O161 Unspecified maternal hypertension, first trimester: Secondary | ICD-10-CM | POA: Insufficient documentation

## 2019-01-31 DIAGNOSIS — Z8249 Family history of ischemic heart disease and other diseases of the circulatory system: Secondary | ICD-10-CM | POA: Insufficient documentation

## 2019-01-31 DIAGNOSIS — Z3A01 Less than 8 weeks gestation of pregnancy: Secondary | ICD-10-CM | POA: Insufficient documentation

## 2019-01-31 DIAGNOSIS — F17210 Nicotine dependence, cigarettes, uncomplicated: Secondary | ICD-10-CM | POA: Insufficient documentation

## 2019-01-31 DIAGNOSIS — Z20828 Contact with and (suspected) exposure to other viral communicable diseases: Secondary | ICD-10-CM | POA: Insufficient documentation

## 2019-01-31 LAB — COMPREHENSIVE METABOLIC PANEL
ALT: 20 U/L (ref 0–44)
AST: 16 U/L (ref 15–41)
Albumin: 3.3 g/dL — ABNORMAL LOW (ref 3.5–5.0)
Alkaline Phosphatase: 64 U/L (ref 38–126)
Anion gap: 6 (ref 5–15)
BUN: <5 mg/dL — ABNORMAL LOW (ref 6–20)
CO2: 24 mmol/L (ref 22–32)
Calcium: 8.6 mg/dL — ABNORMAL LOW (ref 8.9–10.3)
Chloride: 106 mmol/L (ref 98–111)
Creatinine, Ser: 0.63 mg/dL (ref 0.44–1.00)
GFR calc Af Amer: >60 mL/min (ref >60)
GFR calc non Af Amer: >60 mL/min (ref >60)
Glucose, Bld: 96 mg/dL (ref 70–99)
Potassium: 3.8 mmol/L (ref 3.5–5.1)
Sodium: 136 mmol/L (ref 135–145)
Total Bilirubin: 0.7 mg/dL (ref 0.3–1.2)
Total Protein: 6.5 g/dL (ref 6.5–8.1)

## 2019-01-31 LAB — URINALYSIS, ROUTINE W REFLEX MICROSCOPIC
Bilirubin Urine: NEGATIVE
Glucose, UA: NEGATIVE mg/dL
Hgb urine dipstick: NEGATIVE
Ketones, ur: NEGATIVE mg/dL
Leukocytes,Ua: NEGATIVE
Nitrite: NEGATIVE
Protein, ur: NEGATIVE mg/dL
Specific Gravity, Urine: 1.013 (ref 1.005–1.030)
pH: 6 (ref 5.0–8.0)

## 2019-01-31 LAB — CBC
HCT: 34.1 % — ABNORMAL LOW (ref 36.0–46.0)
Hemoglobin: 11.2 g/dL — ABNORMAL LOW (ref 12.0–15.0)
MCH: 26.4 pg (ref 26.0–34.0)
MCHC: 32.8 g/dL (ref 30.0–36.0)
MCV: 80.4 fL (ref 80.0–100.0)
Platelets: 267 10*3/uL (ref 150–400)
RBC: 4.24 MIL/uL (ref 3.87–5.11)
RDW: 14.2 % (ref 11.5–15.5)
WBC: 6.2 10*3/uL (ref 4.0–10.5)
nRBC: 0 % (ref 0.0–0.2)

## 2019-01-31 LAB — SARS CORONAVIRUS 2 (TAT 6-24 HRS): SARS Coronavirus 2: NEGATIVE

## 2019-01-31 LAB — POCT PREGNANCY, URINE: Preg Test, Ur: POSITIVE — AB

## 2019-01-31 LAB — HCG, QUANTITATIVE, PREGNANCY: hCG, Beta Chain, Quant, S: 23050 m[IU]/mL — ABNORMAL HIGH (ref <5)

## 2019-01-31 IMAGING — US US OB < 14 WEEKS - US OB TV
1 series · 15 of 28 positions shown · non-contrast
Comparison: None.

CLINICAL DATA: Pelvic pain

EXAM:
OBSTETRIC <14 WK US AND TRANSVAGINAL OB US
TECHNIQUE: Both transabdominal and transvaginal ultrasound examinations were
performed for complete evaluation of the gestation as well as the
maternal uterus, adnexal regions, and pelvic cul-de-sac.
Transvaginal technique was performed to assess early pregnancy.

[Series 1: us ob < 14 weeks - us ob tv · 15 of 33 slices shown]
[im 1/33]
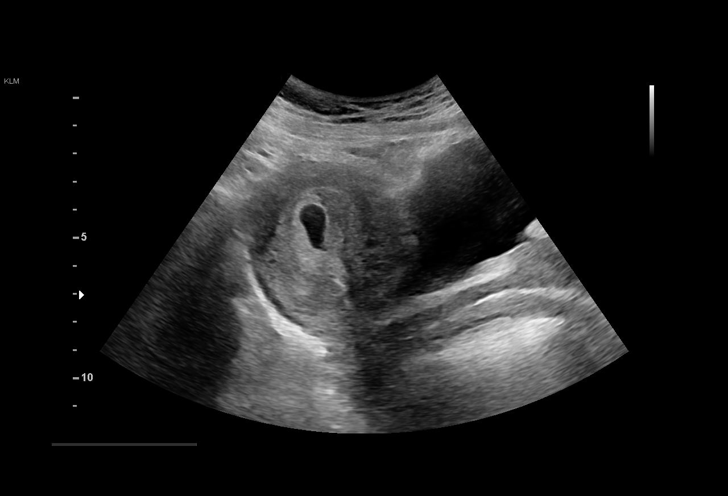
[im 3/33]
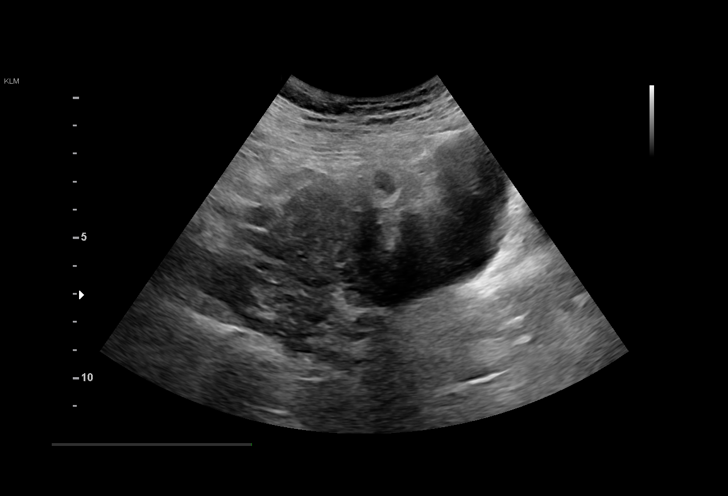
[im 5/33]
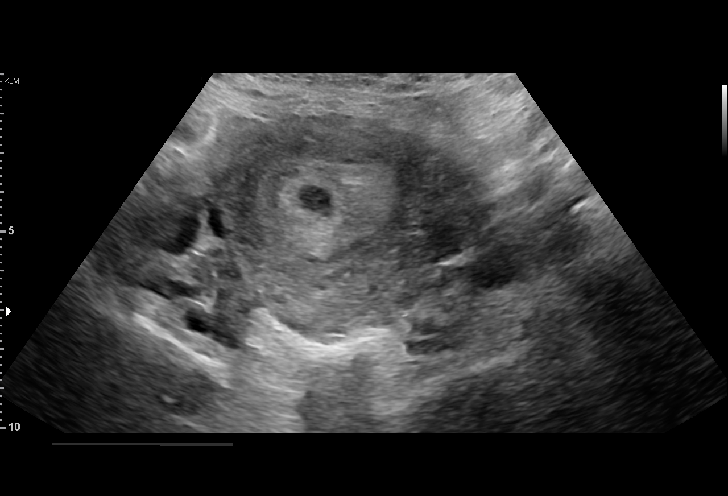
[im 8/33]
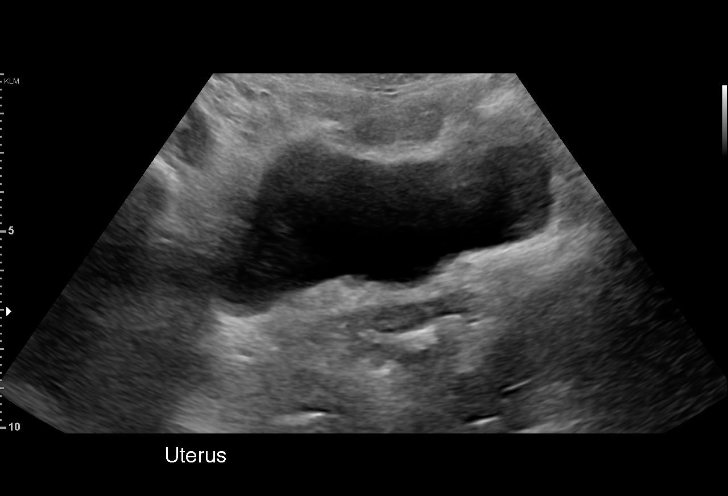
[im 10/33]
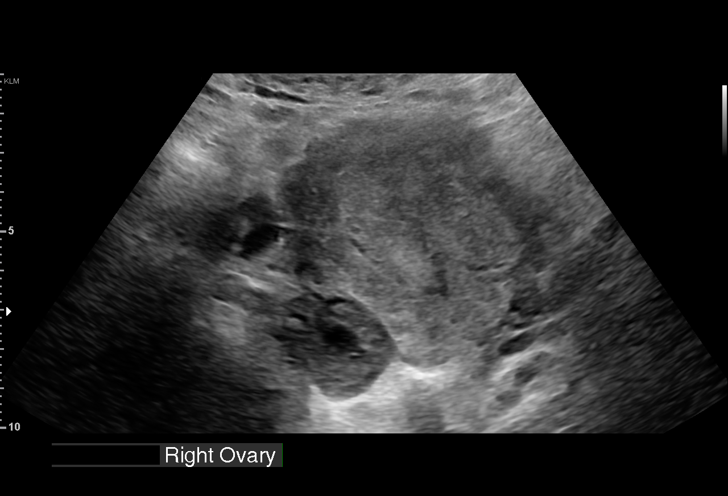
[im 12/33]
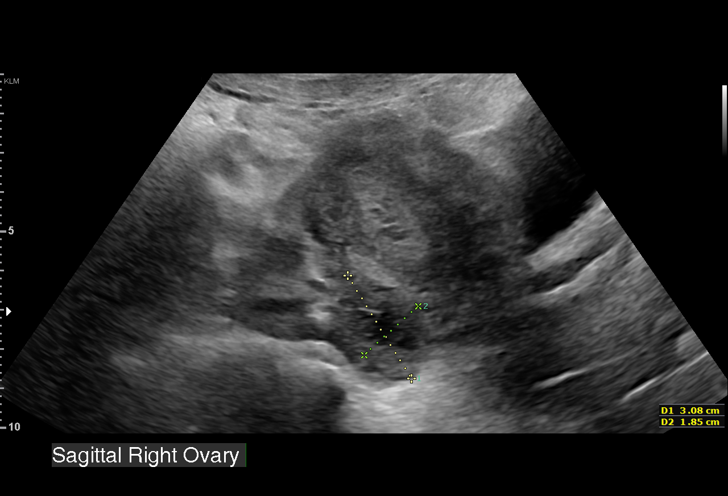
[im 15/33]
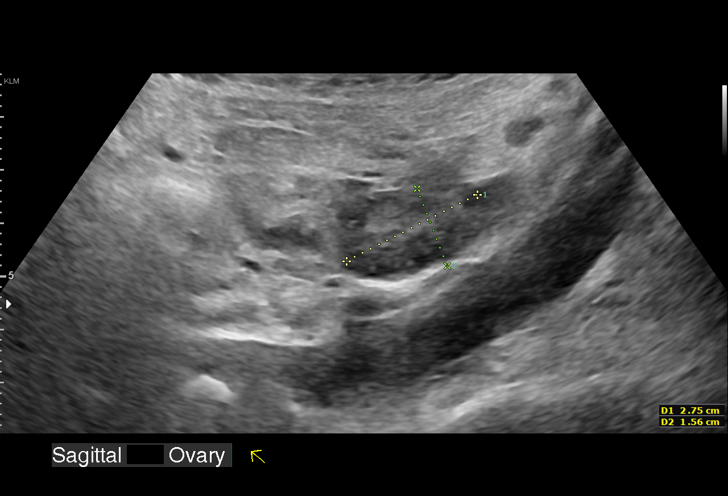
[im 17/33]
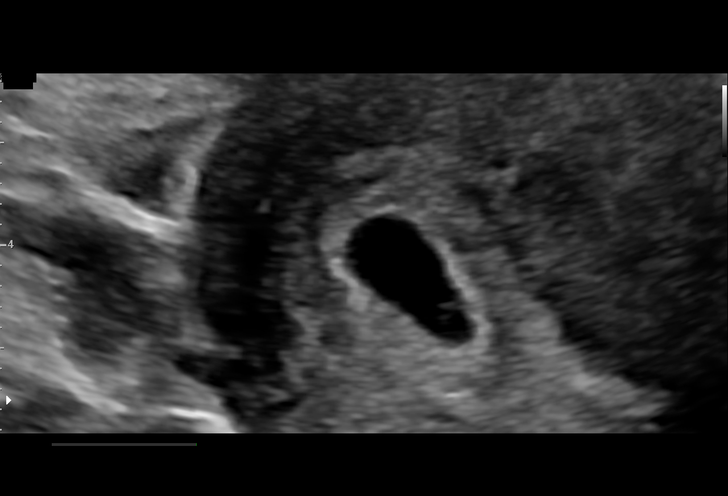
[im 18/33]
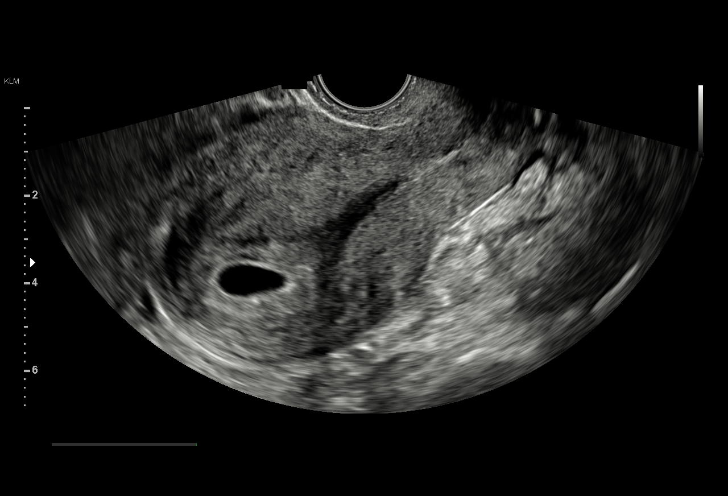
[im 21/33]
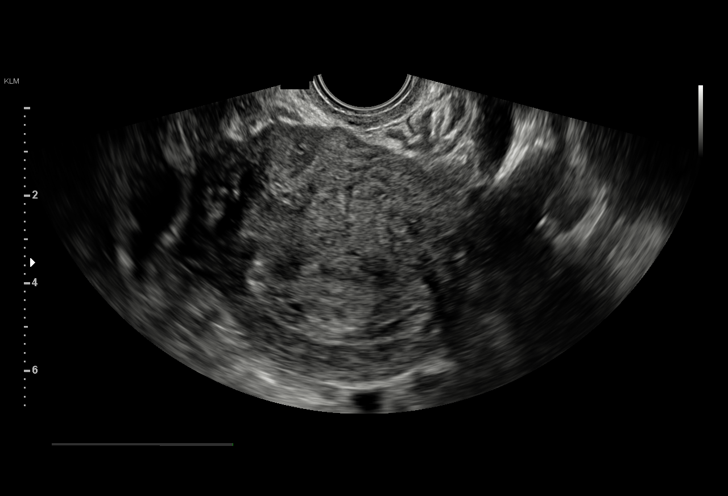
[im 23/33]
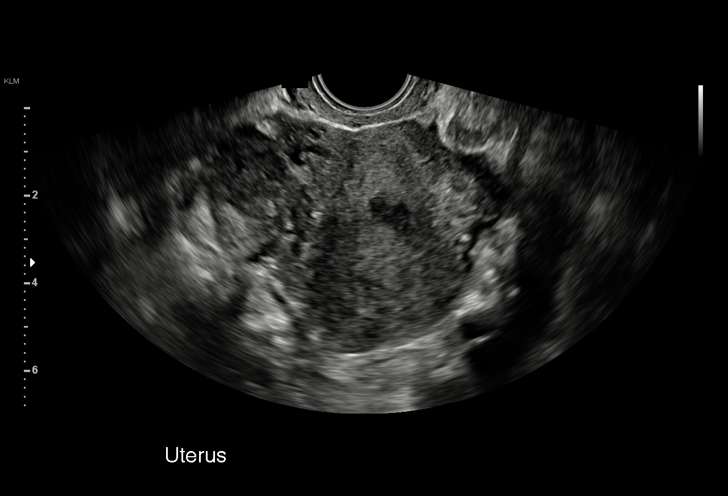
[im 25/33]
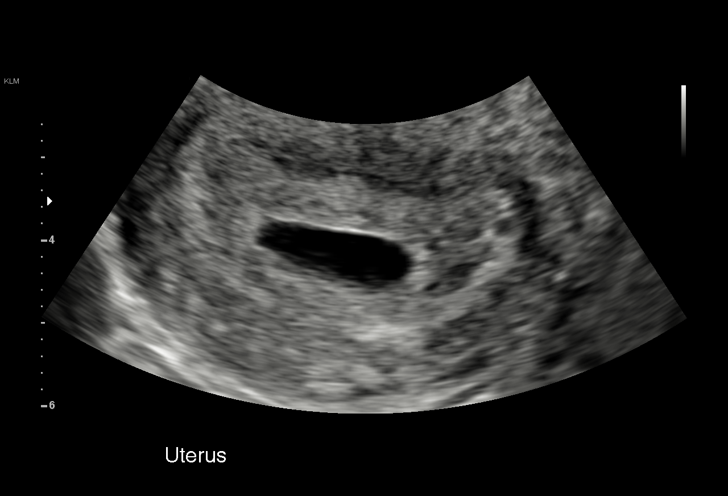
[im 28/33]
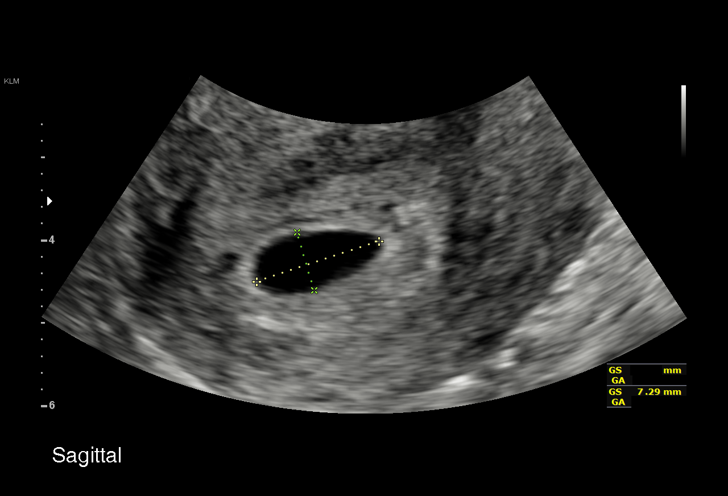
[im 30/33]
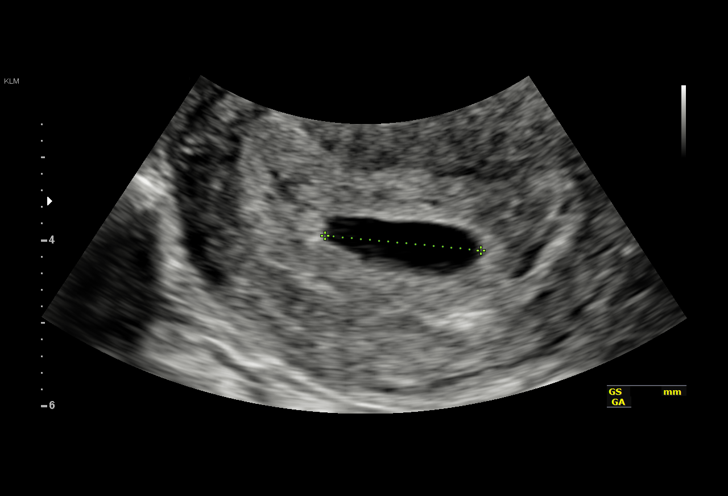
[im 33/33]
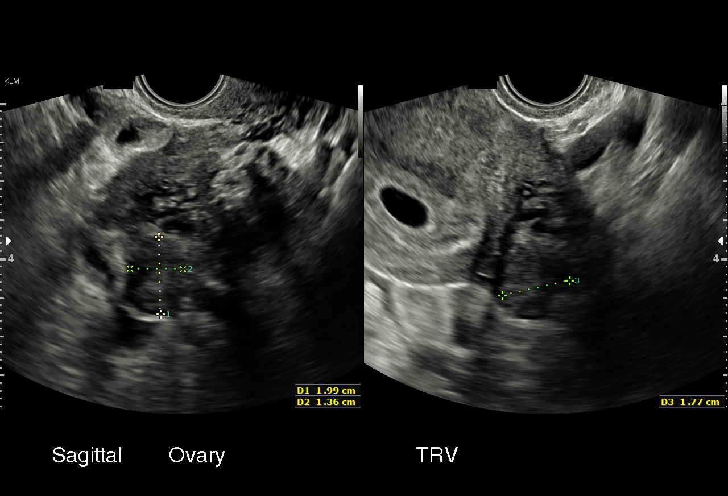

[15 of 28 positions shown; findings below may reference images not displayed]

FINDINGS: Intrauterine gestational sac: Visualized

Yolk sac: Visualized

Embryo:  Not visualized

Cardiac Activity: Not visualized

MSD: 14 mm   6 w   1 d

Subchorionic hemorrhage:  None visualized.

Maternal uterus/adnexae: Cervical os is closed. Right ovary measures
3.0 x 2.9 x 2.7 cm. Left ovary measures 2.0 x 1.4 x 1.7 cm. Small
corpus luteum on the right. No other extrauterine pelvic mass. No
free fluid.
IMPRESSION: Within the uterus, there is a gestational sac and a yolk sac. Fetal
pole not seen currently. Based on gestational sac size, estimated
gestational age is approximately 6 weeks. Advise repeat study in
10-14 days to assess for fetal pole and fetal heart activity.

No subchorionic hemorrhage. No extrauterine pelvic mass beyond small
physiologic corpus luteum on the right.

## 2019-01-31 MED ORDER — PREPLUS 27-1 MG PO TABS: 1.0000 | ORAL_TABLET | Freq: Every day | ORAL | 13 refills | Status: DC

## 2019-01-31 NOTE — Discharge Instructions (Signed)
COVID-19 COVID-19 is a respiratory infection that is caused by a virus called severe acute respiratory syndrome coronavirus 2 (SARS-CoV-2). The disease is also known as coronavirus disease or novel coronavirus. In some people, the virus may not cause any symptoms. In others, it may cause a serious infection. The infection can get worse quickly and can lead to complications, such as:  Pneumonia, or infection of the lungs.  Acute respiratory distress syndrome or ARDS. This is fluid build-up in the lungs.  Acute respiratory failure. This is a condition in which there is not enough oxygen passing from the lungs to the body.  Sepsis or septic shock. This is a serious bodily reaction to an infection.  Blood clotting problems.  Secondary infections due to bacteria or fungus. The virus that causes COVID-19 is contagious. This means that it can spread from person to person through droplets from coughs and sneezes (respiratory secretions). What are the causes? This illness is caused by a virus. You may catch the virus by:  Breathing in droplets from an infected person's cough or sneeze.  Touching something, like a table or a doorknob, that was exposed to the virus (contaminated) and then touching your mouth, nose, or eyes. What increases the risk? Risk for infection You are more likely to be infected with this virus if you:  Live in or travel to an area with a COVID-19 outbreak.  Come in contact with a sick person who recently traveled to an area with a COVID-19 outbreak.  Provide care for or live with a person who is infected with COVID-19. Risk for serious illness You are more likely to become seriously ill from the virus if you:  Are 95 years of age or older.  Have a long-term disease that lowers your body's ability to fight infection (immunocompromised).  Live in a nursing home or long-term care facility.  Have a long-term (chronic) disease such as: ? Chronic lung disease, including  chronic obstructive pulmonary disease or asthma ? Heart disease. ? Diabetes. ? Chronic kidney disease. ? Liver disease.  Are obese. What are the signs or symptoms? Symptoms of this condition can range from mild to severe. Symptoms may appear any time from 2 to 14 days after being exposed to the virus. They include:  A fever.  A cough.  Difficulty breathing.  Chills.  Muscle pains.  A sore throat.  Loss of taste or smell. Some people may also have stomach problems, such as nausea, vomiting, or diarrhea. Other people may not have any symptoms of COVID-19. How is this diagnosed? This condition may be diagnosed based on:  Your signs and symptoms, especially if: ? You live in an area with a COVID-19 outbreak. ? You recently traveled to or from an area where the virus is common. ? You provide care for or live with a person who was diagnosed with COVID-19.  A physical exam.  Lab tests, which may include: ? A nasal swab to take a sample of fluid from your nose. ? A throat swab to take a sample of fluid from your throat. ? A sample of mucus from your lungs (sputum). ? Blood tests.  Imaging tests, which may include, X-rays, CT scan, or ultrasound. How is this treated? At present, there is no medicine to treat COVID-19. Medicines that treat other diseases are being used on a trial basis to see if they are effective against COVID-19. Your health care provider will talk with you about ways to treat your symptoms. For most  people, the infection is mild and can be managed at home with rest, fluids, and over-the-counter medicines. °Treatment for a serious infection usually takes places in a hospital intensive care unit (ICU). It may include one or more of the following treatments. These treatments are given until your symptoms improve. °· Receiving fluids and medicines through an IV. °· Supplemental oxygen. Extra oxygen is given through a tube in the nose, a face mask, or a  hood. °· Positioning you to lie on your stomach (prone position). This makes it easier for oxygen to get into the lungs. °· Continuous positive airway pressure (CPAP) or bi-level positive airway pressure (BPAP) machine. This treatment uses mild air pressure to keep the airways open. A tube that is connected to a motor delivers oxygen to the body. °· Ventilator. This treatment moves air into and out of the lungs by using a tube that is placed in your windpipe. °· Tracheostomy. This is a procedure to create a hole in the neck so that a breathing tube can be inserted. °· Extracorporeal membrane oxygenation (ECMO). This procedure gives the lungs a chance to recover by taking over the functions of the heart and lungs. It supplies oxygen to the body and removes carbon dioxide. °Follow these instructions at home: °Lifestyle °· If you are sick, stay home except to get medical care. Your health care provider will tell you how long to stay home. Call your health care provider before you go for medical care. °· Rest at home as told by your health care provider. °· Do not use any products that contain nicotine or tobacco, such as cigarettes, e-cigarettes, and chewing tobacco. If you need help quitting, ask your health care provider. °· Return to your normal activities as told by your health care provider. Ask your health care provider what activities are safe for you. °General instructions °· Take over-the-counter and prescription medicines only as told by your health care provider. °· Drink enough fluid to keep your urine pale yellow. °· Keep all follow-up visits as told by your health care provider. This is important. °How is this prevented? ° °There is no vaccine to help prevent COVID-19 infection. However, there are steps you can take to protect yourself and others from this virus. °To protect yourself:  °· Do not travel to areas where COVID-19 is a risk. The areas where COVID-19 is reported change often. To identify  high-risk areas and travel restrictions, check the CDC travel website: wwwnc.cdc.gov/travel/notices °· If you live in, or must travel to, an area where COVID-19 is a risk, take precautions to avoid infection. °? Stay away from people who are sick. °? Wash your hands often with soap and water for 20 seconds. If soap and water are not available, use an alcohol-based hand sanitizer. °? Avoid touching your mouth, face, eyes, or nose. °? Avoid going out in public, follow guidance from your state and local health authorities. °? If you must go out in public, wear a cloth face covering or face mask. °? Disinfect objects and surfaces that are frequently touched every day. This may include: °§ Counters and tables. °§ Doorknobs and light switches. °§ Sinks and faucets. °§ Electronics, such as phones, remote controls, keyboards, computers, and tablets. °To protect others: °If you have symptoms of COVID-19, take steps to prevent the virus from spreading to others. °· If you think you have a COVID-19 infection, contact your health care provider right away. Tell your health care team that you think you may   have a COVID-19 infection.  Stay home. Leave your house only to seek medical care. Do not use public transport.  Do not travel while you are sick.  Wash your hands often with soap and water for 20 seconds. If soap and water are not available, use alcohol-based hand sanitizer.  Stay away from other members of your household. Let healthy household members care for children and pets, if possible. If you have to care for children or pets, wash your hands often and wear a mask. If possible, stay in your own room, separate from others. Use a different bathroom.  Make sure that all people in your household wash their hands well and often.  Cough or sneeze into a tissue or your sleeve or elbow. Do not cough or sneeze into your hand or into the air.  Wear a cloth face covering or face mask. Where to find more  information  Centers for Disease Control and Prevention: PurpleGadgets.be  World Health Organization: https://www.castaneda.info/ Contact a health care provider if:  You live in or have traveled to an area where COVID-19 is a risk and you have symptoms of the infection.  You have had contact with someone who has COVID-19 and you have symptoms of the infection. Get help right away if:  You have trouble breathing.  You have pain or pressure in your chest.  You have confusion.  You have bluish lips and fingernails.  You have difficulty waking from sleep.  You have symptoms that get worse. These symptoms may represent a serious problem that is an emergency. Do not wait to see if the symptoms will go away. Get medical help right away. Call your local emergency services (911 in the [place] Do not drive yourself to the hospital. Let the emergency medical personnel know if you think you have COVID-19. Summary  COVID-19 is a respiratory infection that is caused by a virus. It is also known as coronavirus disease or novel coronavirus. It can cause serious infections, such as pneumonia, acute respiratory distress syndrome, acute respiratory failure, or sepsis.  The virus that causes COVID-19 is contagious. This means that it can spread from person to person through droplets from coughs and sneezes.  You are more likely to develop a serious illness if you are 83 years of age or older, have a weak immunity, live in a nursing home, or have chronic disease.  There is no medicine to treat COVID-19. Your health care provider will talk with you about ways to treat your symptoms.  Take steps to protect yourself and others from infection. Wash your hands often and disinfect objects and surfaces that are frequently touched every day. Stay away from people who are sick and wear a mask if you are sick. This information is not intended to replace advice given to you by  your health care provider. Make sure you discuss any questions you have with your health care provider. Document Released: 03/03/2018 Document Revised: 06/23/2018 Document Reviewed: 03/03/2018 Elsevier Patient Education  [place] Medications in Pregnancy   Acne: Benzoyl Peroxide Salicylic Acid  Backache/Headache: Tylenol: 2 regular strength every 4 hours OR              2 Extra strength every 6 hours  Colds/Coughs/Allergies: Benadryl (alcohol free) 25 mg every 6 hours as needed Breath right strips Claritin Cepacol throat lozenges Chloraseptic throat spray Cold-Eeze- up to three times per day Cough drops, alcohol free Flonase (by prescription only) Guaifenesin Mucinex Robitussin DM (plain  only, alcohol free) Saline nasal spray/drops Sudafed (pseudoephedrine) & Actifed ** use only after [redacted] weeks gestation and if you do not have high blood pressure Tylenol Vicks Vaporub Zinc lozenges Zyrtec   Constipation: Colace Ducolax suppositories Fleet enema Glycerin suppositories Metamucil Milk of magnesia Miralax Senokot Smooth move tea  Diarrhea: Kaopectate Imodium A-D  *NO pepto Bismol  Hemorrhoids: Anusol Anusol HC Preparation H Tucks  Indigestion: Tums Maalox Mylanta Zantac  Pepcid  Insomnia: Benadryl (alcohol free) 25mg  every 6 hours as needed Tylenol PM Unisom, no Gelcaps  Leg Cramps: Tums MagGel  Nausea/Vomiting:  Bonine Dramamine Emetrol Ginger extract Sea bands Meclizine  Nausea medication to take during pregnancy:  Unisom (doxylamine succinate 25 mg tablets) Take one tablet daily at bedtime. If symptoms are not adequately controlled, the dose can be increased to a maximum recommended dose of two tablets daily (1/2 tablet in the morning, 1/2 tablet mid-afternoon and one at bedtime). Vitamin B6 100mg  tablets. Take one tablet twice a day (up to 200 mg per day).  Skin Rashes: Aveeno products Benadryl cream or 25mg  every 6  hours as needed Calamine Lotion 1% cortisone cream  Yeast infection: Gyne-lotrimin 7 Monistat 7   **If taking multiple medications, please check labels to avoid duplicating the same active ingredients **take medication as directed on the label ** Do not exceed 4000 mg of tylenol in 24 hours **Do not take medications that contain aspirin or ibuprofen

## 2019-01-31 NOTE — MAU Provider Note (Signed)
History     CSN: [id number]  Arrival date and time: 01/31/19 1012   First Provider Initiated Contact with Patient 01/31/19 1237      Chief Complaint  Patient presents with  . right sided pain   HPI Catherine Simmons is a 33 y.o. [id number] at [id number] who presents to MAU with chief complaint of RLQ pain. This is a new problem, onset Thursday 01/26/2019. She rates her pain as 7/10 on arrival to MAU. Her pain does not radiate. She denies aggravating or alleviating factors. She has not taken medication or tried other treatments for this complaint. She declines offer of pain medicine.  Patient is very upset and tearful after recent news that she was exposed to a COVID positive patient at work. She states she received the notification from her employer via phone as she was signing in to MAU. She denies shortness of breath, weakness, syncope, fever.  OB History    Gravida  6   Para  5   Term  4   Preterm  1   AB      Living  4     SAB      TAB      Ectopic      Multiple  0   Live Births  4           Past Medical History:  Diagnosis Date  . Anemia   . Cholecystitis 07/16/10  . Genital warts 2004  . Hemorrhoids   . HIV (human immunodeficiency virus infection) (HCC)   . Hx of pelvic inflammatory disease 08/31/2013   And hx of multiple STDs   . Hypertension   . Pregnancy induced hypertension   . Sickle cell trait [facility](HCC)     Past Surgical History:  Procedure Laterality Date  . CHOLECYSTECTOMY    . CHOLECYSTECTOMY  07/19/2010  . EXTERNAL CEPHALIC VERSION  12/11/2016      . WISDOM TOOTH EXTRACTION      Family History  Problem Relation Age of Onset  . Cancer Mother   . Hypertension Mother   . Diabetes Mother   . Pancreatitis Mother   . Hypertension Father   . Diabetes Maternal Aunt     Social History   Tobacco Use  . Smoking status: Light Tobacco Smoker    Packs/day: 0.30    Types: Cigarettes    Start date: 03/11/2012    Last attempt to quit: 12/11/2015     Years since quitting: 3.1  . Smokeless tobacco: Never Used  Substance Use Topics  . Alcohol use: No    Comment: occ  . Drug use: Yes    Frequency: 5.0 times per week    Allergies:  Allergies  Allergen Reactions  . Stadol [Butorphanol Tartrate] Itching    Tolerates percocet    No medications prior to admission.    Review of Systems  Constitutional: Negative for fever.  Respiratory: Negative for chest tightness and shortness of breath.   Cardiovascular: Negative for palpitations.  Gastrointestinal: Positive for abdominal pain.  Genitourinary: Negative for difficulty urinating, dysuria, vaginal bleeding, vaginal discharge and vaginal pain.  Musculoskeletal: Negative for back pain.  All other systems reviewed and are negative.  Physical Exam   Blood pressure (!) 148/89, pulse 88, temperature 98.2 F (36.8 C), resp. rate 16, height 5\' 1"  (1.549 m), weight 106.6 kg, last menstrual period 12/10/2018.  Physical Exam  Nursing note and vitals reviewed. Constitutional: She is oriented to person, place, and time. She appears well-developed  and well-nourished.  Cardiovascular: Normal rate and normal pulses.  Respiratory: Effort normal and breath sounds normal. She has no decreased breath sounds.  GI: Soft. She exhibits no distension. There is no abdominal tenderness. There is no rebound, no guarding and no CVA tenderness.  Musculoskeletal:        General: Normal range of motion.  Neurological: She is alert and oriented to person, place, and time.  Skin: Skin is warm and dry.  Psychiatric: She has a normal mood and affect. Her behavior is normal. Judgment and thought content normal.    MAU Course  Procedures   Patient Vitals for the past 24 hrs:  BP Temp Pulse Resp Height Weight  01/31/19 1309 (!) 148/89 98.2 F (36.8 C) 88 16 - -  01/31/19 1054 (!) 157/88 98.5 F (36.9 C) (!) 57 16 5\' 1"  (1.549 m) 106.6 kg   Results for orders placed or performed during the hospital  encounter of 01/31/19 (from the past 24 hour(s))  Pregnancy, urine POC     Status: Abnormal   Collection Time: 01/31/19 10:40 AM  Result Value Ref Range   Preg Test, Ur POSITIVE (A) NEGATIVE  CBC     Status: Abnormal   Collection Time: 01/31/19 10:59 AM  Result Value Ref Range   WBC 6.2 4.0 - 10.5 K/uL   RBC 4.24 3.87 - 5.11 MIL/uL   Hemoglobin 11.2 (L) 12.0 - 15.0 g/dL   HCT 34.1 (L) 36.0 - 46.0 %   MCV 80.4 80.0 - 100.0 fL   MCH 26.4 26.0 - 34.0 pg   MCHC 32.8 30.0 - 36.0 g/dL   RDW 14.2 11.5 - 15.5 %   Platelets 267 150 - 400 K/uL   nRBC 0.0 0.0 - 0.2 %  hCG, quantitative, pregnancy     Status: Abnormal   Collection Time: 01/31/19 10:59 AM  Result Value Ref Range   hCG, Beta Chain, Quant, S 23,050 (H) <5 mIU/mL  Comprehensive metabolic panel     Status: Abnormal   Collection Time: 01/31/19 10:59 AM  Result Value Ref Range   Sodium 136 135 - 145 mmol/L   Potassium 3.8 3.5 - 5.1 mmol/L   Chloride 106 98 - 111 mmol/L   CO2 24 22 - 32 mmol/L   Glucose, Bld 96 70 - 99 mg/dL   BUN <5 (L) 6 - 20 mg/dL   Creatinine, Ser 0.63 0.44 - 1.00 mg/dL   Calcium 8.6 (L) 8.9 - 10.3 mg/dL   Total Protein 6.5 6.5 - 8.1 g/dL   Albumin 3.3 (L) 3.5 - 5.0 g/dL   AST 16 15 - 41 U/L   ALT 20 0 - 44 U/L   Alkaline Phosphatase 64 38 - 126 U/L   Total Bilirubin 0.7 0.3 - 1.2 mg/dL   GFR calc non Af Amer >60 >60 mL/min   GFR calc Af Amer >60 >60 mL/min   Anion gap 6 5 - 15  Urinalysis, Routine w reflex microscopic     Status: Abnormal   Collection Time: 01/31/19 11:44 AM  Result Value Ref Range   Color, Urine YELLOW YELLOW   APPearance HAZY (A) CLEAR   Specific Gravity, Urine 1.013 1.005 - 1.030   pH 6.0 5.0 - 8.0   Glucose, UA NEGATIVE NEGATIVE mg/dL   Hgb urine dipstick NEGATIVE NEGATIVE   Bilirubin Urine NEGATIVE NEGATIVE   Ketones, ur NEGATIVE NEGATIVE mg/dL   Protein, ur NEGATIVE NEGATIVE mg/dL   Nitrite NEGATIVE NEGATIVE   Leukocytes,Ua NEGATIVE NEGATIVE  US OB LESS THAN 14 WEEKS  WITH OB TRANSVAGINAL  Result Date: 01/31/2019 CLINICAL DATA:  Pelvic pain EXAM: OBSTETRIC <14 WK [place] AND TRANSVAGINAL OB US TECHNIQUE: Both transabdominal and transvaginal ultrasound examinations were performed for complete evaluation of the gestation as well as the maternal uterus, adnexal regions, and pelvic cul-de-sac. Transvaginal technique was performed to assess early pregnancy. COMPARISON:  None. FINDINGS: Intrauterine gestational sac: Visualized Yolk sac: Visualized Embryo:  Not visualized Cardiac Activity: Not visualized MSD: 14 mm   6 w   1 d Subchorionic hemorrhage:  None visualized. Maternal uterus/adnexae: Cervical os is closed. Right ovary measures 3.0 x 2.9 x 2.7 cm. Left ovary measures 2.0 x 1.4 x 1.7 cm. Small corpus luteum on the right. No other extrauterine pelvic mass. No free fluid. IMPRESSION: Within the uterus, there is a gestational sac and a yolk sac. Fetal pole not seen currently. Based on gestational sac size, estimated gestational age is approximately 6 weeks. Advise repeat study in 10-14 days to assess for fetal pole and fetal heart activity. No subchorionic hemorrhage. No extrauterine pelvic mass beyond small physiologic corpus luteum on the right. Electronically Signed   By: Bretta Bang III M.D.   On: 01/31/2019 12:26   Assessment and Plan  --33 y.o. [id number] with [facility] at [redacted]w[redacted]d  --Chronic Hypertension --Emphasized importance of self quarantining, notifying employer of pending COVID test results --Enrolled in COVID home monitoring --Discharge home in stable condition with first trimester and COVID precautions  Calvert Cantor, CNM 01/31/2019, 2:38 PM

## 2019-01-31 NOTE — MAU Note (Signed)
.   Catherine Simmons is a 33 y.o. at [redacted]w[redacted]d here in MAU reporting: pain in the right lower abdomen. Denies any vaginal bleeding. Pain started 3 days ago LMP:12/10/18 Onset of complaint: 3 days Pain score: 7 Vitals:   01/31/19 1054  BP: (!) 157/88  Pulse: (!) 57  Resp: 16  Temp: 98.5 F (36.9 C)     FHT: Lab orders placed from triage: UA/UPT

## 2019-02-02 ENCOUNTER — Encounter: Payer: Self-pay | Admitting: Student in an Organized Health Care Education/Training Program

## 2019-02-02 ENCOUNTER — Inpatient Hospital Stay (HOSPITAL_COMMUNITY)
Admission: AD | Admit: 2019-02-02 | Discharge: 2019-02-02 | Disposition: A | Discharge status: Home / Self Care | Payer: Medicaid Other | Attending: Obstetrics and Gynecology | Admitting: Obstetrics and Gynecology

## 2019-02-02 ENCOUNTER — Encounter (HOSPITAL_COMMUNITY): Payer: Self-pay | Admitting: Obstetrics and Gynecology

## 2019-02-02 ENCOUNTER — Other Ambulatory Visit: Payer: Self-pay

## 2019-02-02 DIAGNOSIS — Z9049 Acquired absence of other specified parts of digestive tract: Secondary | ICD-10-CM | POA: Insufficient documentation

## 2019-02-02 DIAGNOSIS — O219 Vomiting of pregnancy, unspecified: Secondary | ICD-10-CM | POA: Insufficient documentation

## 2019-02-02 DIAGNOSIS — F1291 Cannabis use, unspecified, in remission: Secondary | ICD-10-CM

## 2019-02-02 DIAGNOSIS — O161 Unspecified maternal hypertension, first trimester: Secondary | ICD-10-CM | POA: Insufficient documentation

## 2019-02-02 DIAGNOSIS — Z87898 Personal history of other specified conditions: Secondary | ICD-10-CM

## 2019-02-02 DIAGNOSIS — O99891 Other specified diseases and conditions complicating pregnancy: Secondary | ICD-10-CM | POA: Insufficient documentation

## 2019-02-02 DIAGNOSIS — Z833 Family history of diabetes mellitus: Secondary | ICD-10-CM | POA: Insufficient documentation

## 2019-02-02 DIAGNOSIS — Z87891 Personal history of nicotine dependence: Secondary | ICD-10-CM | POA: Insufficient documentation

## 2019-02-02 DIAGNOSIS — R042 Hemoptysis: Secondary | ICD-10-CM | POA: Insufficient documentation

## 2019-02-02 DIAGNOSIS — Z8249 Family history of ischemic heart disease and other diseases of the circulatory system: Secondary | ICD-10-CM | POA: Insufficient documentation

## 2019-02-02 DIAGNOSIS — Z3490 Encounter for supervision of normal pregnancy, unspecified, unspecified trimester: Secondary | ICD-10-CM

## 2019-02-02 DIAGNOSIS — Z21 Asymptomatic human immunodeficiency virus [HIV] infection status: Secondary | ICD-10-CM | POA: Insufficient documentation

## 2019-02-02 DIAGNOSIS — Z809 Family history of malignant neoplasm, unspecified: Secondary | ICD-10-CM | POA: Insufficient documentation

## 2019-02-02 DIAGNOSIS — Z3A01 Less than 8 weeks gestation of pregnancy: Secondary | ICD-10-CM | POA: Insufficient documentation

## 2019-02-02 DIAGNOSIS — Z885 Allergy status to narcotic agent status: Secondary | ICD-10-CM | POA: Insufficient documentation

## 2019-02-02 DIAGNOSIS — O98711 Human immunodeficiency virus [HIV] disease complicating pregnancy, first trimester: Secondary | ICD-10-CM | POA: Insufficient documentation

## 2019-02-02 LAB — URINALYSIS, ROUTINE W REFLEX MICROSCOPIC
Bilirubin Urine: NEGATIVE
Glucose, UA: NEGATIVE mg/dL
Hgb urine dipstick: NEGATIVE
Ketones, ur: 20 mg/dL — AB
Leukocytes,Ua: NEGATIVE
Nitrite: NEGATIVE
Protein, ur: NEGATIVE mg/dL
Specific Gravity, Urine: 1.014 (ref 1.005–1.030)
pH: 6 (ref 5.0–8.0)

## 2019-02-02 LAB — RAPID URINE DRUG SCREEN, HOSP PERFORMED
Amphetamines: NOT DETECTED
Barbiturates: NOT DETECTED
Benzodiazepines: NOT DETECTED
Cocaine: NOT DETECTED
Opiates: NOT DETECTED
Tetrahydrocannabinol: POSITIVE — AB

## 2019-02-02 MED ORDER — FAMOTIDINE 20 MG PO TABS: 20.0000 mg | ORAL_TABLET | Freq: Two times a day (BID) | ORAL | 1 refills | Status: DC

## 2019-02-02 MED ORDER — METOCLOPRAMIDE HCL 5 MG/ML IJ SOLN
5.0000 mg | Freq: Once | INTRAMUSCULAR | Status: AC
  Administered 2019-02-02: 5 mg via INTRAVENOUS
  Filled 2019-02-02: qty 2

## 2019-02-02 MED ORDER — LACTATED RINGERS IV BOLUS
1000.0000 mL | Freq: Once | INTRAVENOUS | Status: AC
  Administered 2019-02-02: 15:00:00 1000 mL via INTRAVENOUS

## 2019-02-02 MED ORDER — FAMOTIDINE IN NACL 20-0.9 MG/50ML-% IV SOLN
20.0000 mg | Freq: Once | INTRAVENOUS | Status: AC
  Administered 2019-02-02: 20 mg via INTRAVENOUS
  Filled 2019-02-02: qty 50

## 2019-02-02 MED ORDER — METOCLOPRAMIDE HCL 10 MG PO TABS: 10.0000 mg | ORAL_TABLET | Freq: Four times a day (QID) | ORAL | 1 refills | Status: DC

## 2019-02-02 NOTE — MAU Provider Note (Signed)
History     CSN: [id number]  Arrival date and time: 02/02/19 1302   First Provider Initiated Contact with Patient 02/02/19 1412      Chief Complaint  Patient presents with  . Nausea  . Hemoptysis   Catherine Simmons is a 33 y.o. [id number] at [redacted]w[redacted]d by early [place].  She presents today for Nausea and Hemoptysis.  Patient states she has been "vomiting all day and night since yesterday and can't eat nothing."  Patient reports that she noted some "red mixed with yellow" around 7 am.  She reports she has been able to take small sips, but vomits soon after.  She reports eating around 5 pm yesterday and had chick-fil-a with vomiting afterwards.  Patient endorses marijuana usage during pregnancy and states "a lot," she goes on to quantify it as "2 blunts a day."  She reports that she has stopped smoking about a week ago.  She reports taking zofran in previous pregnancies with good results.  Patient reports that she is not taking any medications for her blood pressure.    OB History    Gravida  6   Para  5   Term  4   Preterm  1   AB      Living  5     SAB      TAB      Ectopic      Multiple  0   Live Births  5           Past Medical History:  Diagnosis Date  . Anemia   . Cholecystitis 07/16/10  . Genital warts 2004  . Hemorrhoids   . HIV (human immunodeficiency virus infection) (HCC)   . Hx of pelvic inflammatory disease 08/31/2013   And hx of multiple STDs   . Hypertension   . Pregnancy induced hypertension   . Sickle cell trait [facility])     Past Surgical History:  Procedure Laterality Date  . CHOLECYSTECTOMY    . CHOLECYSTECTOMY  07/19/2010  . EXTERNAL CEPHALIC VERSION  12/11/2016      . WISDOM TOOTH EXTRACTION      Family History  Problem Relation Age of Onset  . Cancer Mother   . Hypertension Mother   . Diabetes Mother   . Pancreatitis Mother   . Hypertension Father   . Diabetes Maternal Aunt     Social History   Tobacco Use  . Smoking status: Former  Smoker    Packs/day: 0.30    Types: Cigarettes    Start date: 03/11/2012    Quit date: 12/11/2015    Years since quitting: 3.1  . Smokeless tobacco: Never Used  Substance Use Topics  . Alcohol use: Not Currently    Comment: occ  . Drug use: Not Currently    Frequency: 5.0 times per week    Types: Marijuana    Allergies:  Allergies  Allergen Reactions  . Stadol [Butorphanol Tartrate] Itching    Tolerates percocet    Medications Prior to Admission  Medication Sig Dispense Refill Last Dose  . bictegravir-emtricitabine-tenofovir AF (BIKTARVY) 50-200-25 MG TABS tablet Take 1 tablet by mouth daily.   01/30/2019  . Prenatal Vit-Fe Fumarate-FA (PREPLUS) 27-1 MG TABS Take 1 tablet by mouth daily. 30 tablet 13     Review of Systems  Constitutional: Negative for chills and fever.  Respiratory: Negative for cough and shortness of breath.   Gastrointestinal: Positive for nausea and vomiting. Negative for abdominal pain.  Genitourinary: Negative for  difficulty urinating, dysuria, pelvic pain, vaginal bleeding and vaginal discharge.  Musculoskeletal: Positive for back pain.  Neurological: Positive for dizziness, light-headedness and headaches.   Physical Exam   Blood pressure (!) 146/80, pulse 63, temperature 98.4 F (36.9 C), temperature source Oral, resp. rate 20, height 5\' 1"  (1.549 m), weight 117.1 kg, last menstrual period 12/10/2018, SpO2 100 %.  Physical Exam  Vitals reviewed. Constitutional: She is oriented to person, place, and time. She appears well-developed and well-nourished.  HENT:  Head: Normocephalic and atraumatic.  Eyes: Conjunctivae are normal.  Cardiovascular: Normal rate.  Respiratory: Effort normal.  Musculoskeletal:     Cervical back: Normal range of motion.  Neurological: She is alert and oriented to person, place, and time.  Skin: Skin is warm and dry.  Psychiatric: She has a normal mood and affect. Her behavior is normal.    MAU Course   Procedures Results for orders placed or performed during the hospital encounter of 02/02/19 (from the past 24 hour(s))  Urinalysis, Routine w reflex microscopic     Status: Abnormal   Collection Time: 02/02/19  1:34 PM  Result Value Ref Range   Color, Urine YELLOW YELLOW   APPearance HAZY (A) CLEAR   Specific Gravity, Urine 1.014 1.005 - 1.030   pH 6.0 5.0 - 8.0   Glucose, UA NEGATIVE NEGATIVE mg/dL   Hgb urine dipstick NEGATIVE NEGATIVE   Bilirubin Urine NEGATIVE NEGATIVE   Ketones, ur 20 (A) NEGATIVE mg/dL   Protein, ur NEGATIVE NEGATIVE mg/dL   Nitrite NEGATIVE NEGATIVE   Leukocytes,Ua NEGATIVE NEGATIVE    MDM Start IV LR Bolus f/b Banana Bag Antiemetic PPI Assessment and Plan  33 year old [id number] SIUP at 7.5 weeks Nausea/Vomiting H/O MJ Abuse  -Reviewed POC -Discussed how usage of marijuana prior to pregnancy will cause some refractory N/V. -Further discussed need to remain complaint with medications to help maintain symptoms.  Informed that goal is for symptoms to be manageable and expectation is for some N/V during pregnancy. -Patient without questions or concerns, but does comment that "I don't even want to be pregnant." -Will start IV, give LR bolus -Reglan 5mg  IV -Pepcid 20mg  IV -Patient informed that this provider does not prescribe zofran usage until after 9 weeks. -Will monitor and reassess after oral challenge.  Cherre RobinsJessica L Jefry Lesinski, MSN, CNM 02/02/2019, 2:12 PM   Reassessment (3:34 PM)  -In room to assess after patient given ginger ale and crackers. -Patient reports some improvement in nausea, but reports it is still present.  -Patient then becomes tearful and states that she is considering termination.  She goes on to give reasons citing unexpected pregnancy and single mother status. -Reassurances given and provider informs patient that she is allowed to make choices that she deems best for her current situation and does not have to justify said choices to  this provider or anyone. -Patient further informed that provider unable to provider information regarding TAB, but knows that a facility [place] available in [place], although provider is unaware of the name or location.  -Patient states that she is aware of all the information, but questions if she will be able to get a BTL after she completes the procedure. -Informed that she would need to have papers signed and get MD consult, but that it is very possible. -Patient informed that provider would put information for [place] office in her discharge paperwork. -Patient without further questions or concerns. -Informed that reglan and pepcid would be sent to pharmacy on file for  her usage. -Discussed usage of zofran in later gestational age if she decides to proceed with pregnancy. -Encouraged to return to MAU for any pregnancy related problems or concerns. -Discharged to home in improved condition.  Maryann Conners MSN, CNM Advanced Practice Provider, [facility]

## 2019-02-02 NOTE — MAU Note (Signed)
Presents with c/o nausea and throwing up blood.  Reports threw up blood last night & earlier this morning, now it's brown.  Denies abd pain or VB.

## 2019-02-02 NOTE — Discharge Instructions (Signed)
Postpartum Tubal Ligation Postpartum tubal ligation (PPTL) is a procedure to close the fallopian tubes. This is done so that you cannot get pregnant. When the fallopian tubes are closed, the eggs that the ovaries release cannot enter the uterus, and sperm cannot reach the eggs. PPTL is done right after childbirth or 1-2 days after childbirth, before the uterus returns to its normal location. If you have a cesarean section, it can be performed at the same time as the procedure. Having this done after childbirth does not make your stay in the hospital longer. PPTL is sometimes called "getting your tubes tied." You should not have this procedure if you want to get pregnant again or if you are unsure about having more children. Tell a health care provider about:  Any allergies you have.  All medicines you are taking, including vitamins, herbs, eye drops, creams, and over-the-counter medicines.  Any problems you or family members have had with anesthetic medicines.  Any blood disorders you have.  Any surgeries you have had.  Any medical conditions you have or have had.  Any past pregnancies. What are the risks? Generally, this is a safe procedure. However, problems may occur, including:  Infection.  Bleeding.  Injury to other organs in the abdomen.  Side effects from anesthetic medicines.  Failure of the procedure. If this happens, you could get pregnant.  Having a fertilized egg attach outside the uterus (ectopic pregnancy). What happens before the procedure?  Ask your health care provider about: ? How much pain you can expect to have. ? What medicines you will be given for pain, especially if you are planning to breastfeed. What happens during the procedure? If you had a vaginal delivery:  You will be given one or more of the following: ? A medicine to help you relax (sedative). ? A medicine to numb the area (local anesthetic). ? A medicine to make you fall asleep (general  anesthetic). ? A medicine that is injected into an area of your body to numb everything below the injection site (regional anesthetic).  If you have been given a general anesthetic, a tube will be put down your throat to help you breathe.  An IV will be inserted into one of your veins.  Your bladder may be emptied with a small tube (catheter).  An incision will be made just below your belly button.  Your fallopian tubes will be located and brought up through the incision.  Your fallopian tubes will be tied off, burned (cauterized), or blocked with a clip, ring, or clamp. A small part in the center of each fallopian tube may be removed.  The incision will be closed with stitches (sutures).  A bandage (dressing) will be placed over the incision. If you had a cesarean delivery:  Tubal ligation will be done through the incision that was used for the cesarean delivery of your baby.  The incision will be closed with sutures.  A dressing will be placed over the incision. The procedure may vary among health care providers and hospitals. What happens after the procedure?  Your blood pressure, heart rate, breathing rate, and blood oxygen level will be monitored until you leave the hospital.  You will be given pain medicine as needed.  Do not drive for 24 hours if you were given a sedative during your procedure. Summary  Postpartum tubal ligation is a procedure that closes the fallopian tubes so you cannot get pregnant anymore.  This procedure is done while you are still   in the hospital after childbirth. If you have a cesarean section, it can be performed at the same time.  Having this done after childbirth does not make your stay in the hospital longer.  Postpartum tubal ligation is considered permanent. You should not have this procedure if you want to get pregnant again or if you are unsure about having more children.  Talk to your health care provider to see if this procedure is  right for you. This information is not intended to replace advice given to you by your health care provider. Make sure you discuss any questions you have with your health care provider. Document Released: 01/26/2005 Document Revised: 12/16/2017 Document Reviewed: 12/16/2017 Elsevier Patient Education  2020 Elsevier Inc.  
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
## 2019-10-19 ENCOUNTER — Ambulatory Visit: Payer: Medicaid Other

## 2019-10-19 ENCOUNTER — Other Ambulatory Visit: Payer: Self-pay

## 2019-10-19 ENCOUNTER — Ambulatory Visit: Payer: Self-pay

## 2019-10-19 DIAGNOSIS — B20 Human immunodeficiency virus [HIV] disease: Secondary | ICD-10-CM

## 2019-10-20 DIAGNOSIS — N912 Amenorrhea, unspecified: Secondary | ICD-10-CM

## 2019-10-20 LAB — T-HELPER CELL (CD4) - (RCID CLINIC ONLY)
CD4 % Helper T Cell: 47 % (ref 33–65)
CD4 T Cell Abs: 1026 /uL (ref 400–1790)

## 2019-10-22 LAB — HIV-1 RNA QUANT-NO REFLEX-BLD
HIV 1 RNA Quant: <20 Copies/mL
HIV-1 RNA Quant, Log: <1.3 Log cps/mL

## 2019-11-09 ENCOUNTER — Encounter: Payer: Self-pay | Admitting: Infectious Diseases

## 2019-11-09 ENCOUNTER — Other Ambulatory Visit: Payer: Self-pay

## 2019-11-09 ENCOUNTER — Ambulatory Visit (INDEPENDENT_AMBULATORY_CARE_PROVIDER_SITE_OTHER): Payer: Self-pay | Admitting: Infectious Diseases

## 2019-11-09 VITALS — BP 176/116 | HR 72 | Temp 98.5°F | Ht 62.0 in | Wt 249.0 lb

## 2019-11-09 DIAGNOSIS — B20 Human immunodeficiency virus [HIV] disease: Secondary | ICD-10-CM

## 2019-11-09 DIAGNOSIS — I10 Essential (primary) hypertension: Secondary | ICD-10-CM

## 2019-11-09 DIAGNOSIS — Z23 Encounter for immunization: Secondary | ICD-10-CM

## 2019-11-09 DIAGNOSIS — N912 Amenorrhea, unspecified: Secondary | ICD-10-CM

## 2019-11-09 LAB — POCT URINE PREGNANCY: Preg Test, Ur: NEGATIVE

## 2019-11-09 MED ORDER — BIKTARVY 50-200-25 MG PO TABS: 1.0000 | ORAL_TABLET | Freq: Every day | ORAL | 11 refills | Status: DC

## 2019-11-09 MED ORDER — HYDROCHLOROTHIAZIDE 25 MG PO TABS: 25.0000 mg | ORAL_TABLET | Freq: Every day | ORAL | 3 refills | Status: DC

## 2019-11-09 NOTE — Patient Instructions (Addendum)
Nice to see you - please pick up your Biktarvy and resume taking this once a day.   Start back your HCTZ (blood pressure pill) once a day in the morning (because it makes you pee more).   - please monitor at home 1-2 times a week. If it is still higher than 140/90 let me know with a mychart message and I will add back your amlodipine once a day.   - no smoking if you smoke  - decrease alcohol if you drink  - weight loss even of 5-10% will help also   - stress reduction (walks outside for example - no phone to distract you - may help a lot, medication, coloring, music therapy)   - get at least 7 hours of sleep a night.    Will plan to see you back in 3 months for a pap smear.   Stop by the lab on your way out.   Would like for you to call to set up an appointment at the [facility] and [facility] to help me with monitoring your blood pressure and wellness care. They are going to be a great resource for you in addition to our clinic.

## 2019-11-09 NOTE — Assessment & Plan Note (Signed)
This is a new problem for Ms. Catherine Simmons.  In discussion with her it does not sound like she has many secondary findings that would suggest a metabolic cause.  Will screen for diabetes, thyroid dysregulation, FSH.  STI testing as well as pregnancy testing performed today.  Pap smear at upcoming office visit.  Referral to GYN for further investigation pending blood tests today.

## 2019-11-09 NOTE — Assessment & Plan Note (Signed)
Will resume hydrochlorothiazide 25 mg once a day.  I asked her to please check her blood pressures once to twice a week and to notify me if they are still persistently elevated above 140/90.  We will likely need to add back amlodipine 10 mg once daily.  We talked about referral to the [facility] wellness center for ongoing care and preventative medicine.

## 2019-11-09 NOTE — Assessment & Plan Note (Signed)
Recent lab work suggest she is still undetectable with a viral load of less than 20, which is pretty surprising given she has not had any medications for 5 months now.  I told her I would still recommend she resume her care on Biktarvy once daily.  While we do not have any evidence the HIV reactivated yet, I informed her it has 100% chance that it will. CD4 count is still in good shape.  We will give her her flu shot today and plan on a Pap smear at her return office visit in 3 months

## 2019-11-09 NOTE — Progress Notes (Signed)
Name: Catherine Simmons  DOB: Jul 27, 1985 MRN: [id number] PCP: Leeroy Bock, DO    Patient Active Problem List   Diagnosis Date Noted  . Amenorrhea 11/09/2019  . Abnormal uterine bleeding 05/13/2018  . Iron deficiency anemia 05/13/2018  . Fatigue 01/03/2018  . Sickle cell trait (HCC) 07/22/2016  . BMI 40.0-44.9, adult (HCC) 07/22/2016  . Hidradenitis suppurativa 05/13/2016  . Marijuana use 12/26/2014  . Chronic hypertension 03/19/2014  . Anxiety 06/30/2013  . Human immunodeficiency virus (HIV) disease (HCC) 10/12/2012  . CARPAL TUNNEL SYNDROME 11/14/2009  . Adjustment disorder with depressed mood 05/09/2008     Subjective:   Chief Complaint  Patient presents with  . Follow-up    has not been taking Biktarvy since April 2021; given condoms       HPI: Biggest concern for her today is that she has had no period now for a few months. Last period started June 28th. Has been taking pregnancy tests frequently. Has only had unprotected sex once with her partner. Denies any excessive thirst, skin changes, weight changes, constipation, hair changes. Mom had periods into her 72s. Terminated pregnancy in January of this with medical therapy - had periods regularly (long and heavy) up until June when they stopped completely.   Last dose of Biktarvy was somewhere in April, but she was taking this 2-3 doses a week to stretch out her medications. Last consistent use was probably around December. Tried to pick it up around March or so and the cost was > $3000. Never called the clinic as she got busy with her family / mom duties and taking care of everyone else.    Review of Systems  Constitutional: Negative for chills, diaphoresis and unexpected weight change.  Respiratory: Negative for shortness of breath.   Cardiovascular: Negative for chest pain.  Gastrointestinal: Negative for abdominal distention, abdominal pain, blood in stool and rectal pain.  Genitourinary: Negative for  dyspareunia, dysuria, genital sores, menstrual problem, pelvic pain, urgency and vaginal discharge.  Skin: Negative for rash.  Psychiatric/Behavioral: Negative for dysphoric mood and sleep disturbance. The patient is not nervous/anxious.       Past Medical History:  Diagnosis Date  . Anemia   . Cholecystitis 07/16/10  . Genital warts 2004  . Hemorrhoids   . HIV (human immunodeficiency virus infection) (HCC)   . Hx of pelvic inflammatory disease 08/31/2013   And hx of multiple STDs   . Hypertension   . Pregnancy induced hypertension   . Sickle cell trait (HCC)     Outpatient Medications Prior to Visit  Medication Sig Dispense Refill  . famotidine (PEPCID) 20 MG tablet Take 1 tablet (20 mg total) by mouth 2 (two) times daily. (Patient not taking: Reported on 11/09/2019) 30 tablet 1  . metoCLOPramide (REGLAN) 10 MG tablet Take 1 tablet (10 mg total) by mouth every 6 (six) hours. (Patient not taking: Reported on 11/09/2019) 30 tablet 1  . Prenatal Vit-Fe Fumarate-FA (PREPLUS) 27-1 MG TABS Take 1 tablet by mouth daily. (Patient not taking: Reported on 11/09/2019) 30 tablet 13  . bictegravir-emtricitabine-tenofovir AF (BIKTARVY) 50-200-25 MG TABS tablet Take 1 tablet by mouth daily. (Patient not taking: Reported on 11/09/2019)     No facility-administered medications prior to visit.     Allergies  Allergen Reactions  . Butorphanol Itching    Tolerates percocet  . Stadol [Butorphanol Tartrate] Itching    Tolerates percocet    Social History   Tobacco Use  . Smoking status: Current Some  Day Smoker    Packs/day: 0.10    Types: Cigarettes    Start date: 03/11/2012    Last attempt to quit: 12/11/2015    Years since quitting: 3.9  . Smokeless tobacco: Never Used  . Tobacco comment: 1-2 cigarettes a day   Vaping Use  . Vaping Use: Never used  Substance Use Topics  . Alcohol use: Not Currently    Comment: occ  . Drug use: Yes    Frequency: 5.0 times per week    Types: Marijuana     Comment: occ    Family History  Problem Relation Age of Onset  . Cancer Mother   . Hypertension Mother   . Diabetes Mother   . Pancreatitis Mother   . Hypertension Father   . Diabetes Maternal Aunt     Social History   Substance and Sexual Activity  Sexual Activity Not Currently  . Partners: Male  . Birth control/protection: Implant   Comment: given condoms 11/09/19     Objective:   Vitals:   11/09/19 0951  BP: (!) 176/116  Pulse: 72  Temp: 98.5 F (36.9 C)  TempSrc: Oral  SpO2: 100%  Weight: 249 lb (112.9 kg)  Height: 5\' 2"  (1.575 m)   Body mass index is 45.54 kg/m.  Physical Exam Vitals signs reviewed.  Constitutional:      General: She is not in acute distress.    Appearance: She is not ill-appearing.     Comments: Seated comfortably in the chair in no distress.  She had one episode where she felt her left arm numbness, chest tightness, shortness of breath.  This heard  after discussions about her mother's passing.  HENT:     Mouth/Throat:     Mouth: Mucous membranes are moist.     Pharynx: Oropharynx is clear.  Eyes:     General: No scleral icterus.    Pupils: Pupils are equal, round, and reactive to light.  Cardiovascular:     Rate and Rhythm: Normal rate and regular rhythm.     Pulses: Normal pulses.     Heart sounds: No murmur. No friction rub.  Pulmonary:     Effort: Pulmonary effort is normal.     Breath sounds: Normal breath sounds. No wheezing or rales.  Abdominal:     General: Bowel sounds are normal. There is no distension.  Musculoskeletal:        General: No swelling.  Skin:    General: Skin is warm and dry.     Capillary Refill: Capillary refill takes less than 2 seconds.  Neurological:     General: No focal deficit present.     Mental Status: She is alert and oriented to person, place, and time.  Psychiatric:     Comments: She is tearful multiple times during the conversation.  Thought content is normal.  Avoids conversations out  her mother.     Lab Results Lab Results  Component Value Date   WBC 6.2 01/31/2019   HGB 11.2 (L) 01/31/2019   HCT 34.1 (L) 01/31/2019   MCV 80.4 01/31/2019   PLT 267 01/31/2019    Lab Results  Component Value Date   CREATININE 0.63 01/31/2019   BUN <5 (L) 01/31/2019   NA 136 01/31/2019   K 3.8 01/31/2019   CL 106 01/31/2019   CO2 24 01/31/2019    Lab Results  Component Value Date   ALT 20 01/31/2019   AST 16 01/31/2019   ALKPHOS 64 01/31/2019  BILITOT 0.7 01/31/2019    Lab Results  Component Value Date   CHOL 153 04/27/2018   HDL 57 04/27/2018   LDLCALC 82 04/27/2018   TRIG 63 04/27/2018   CHOLHDL 2.7 04/27/2018   HIV 1 RNA Quant  Date Value  10/19/2019 <20 Copies/mL  08/04/2018 <20 NOT DETECTED copies/mL  04/27/2018 4,010 copies/mL (H)   HIV-1 RNA Viral Load  Date Value  11/19/2016 <20 copies/mL  05/15/2014 <40  04/09/2014 <40   CD4 (no units)  Date Value  04/09/2014 1,016  02/19/2014 776  12/27/2013 651   CD4 T Cell Abs (/uL)  Date Value  10/19/2019 1,026  04/27/2018 1,030  04/01/2017 800     Assessment & Plan:   Problem List Items Addressed This Visit      Unprioritized   Human immunodeficiency virus (HIV) disease (HCC) - Primary (Chronic)    Recent lab work suggest she is still undetectable with a viral load of less than 20, which is pretty surprising given she has not had any medications for 5 months now.  I told her I would still recommend she resume her care on Biktarvy once daily.  While we do not have any evidence the HIV reactivated yet, I informed her it has 100% chance that it will. CD4 count is still in good shape.  We will give her her flu shot today and plan on a Pap smear at her return office visit in 3 months        Relevant Medications   bictegravir-emtricitabine-tenofovir AF (BIKTARVY) 50-200-25 MG TABS tablet   Chronic hypertension    Will resume hydrochlorothiazide 25 mg once a day.  I asked her to please check her  blood pressures once to twice a week and to notify me if they are still persistently elevated above 140/90.  We will likely need to add back amlodipine 10 mg once daily.  We talked about referral to the [facility] wellness center for ongoing care and preventative medicine.      Relevant Medications   hydrochlorothiazide (HYDRODIURIL) 25 MG tablet   Amenorrhea    This is a new problem for Ms. [name].  In discussion with her it does not sound like she has many secondary findings that would suggest a metabolic cause.  Will screen for diabetes, thyroid dysregulation, FSH.  STI testing as well as pregnancy testing performed today.  Pap smear at upcoming office visit.  Referral to GYN for further investigation pending blood tests today.      Relevant Orders   POCT urine pregnancy (Completed)   Urine cytology ancillary only   Thyroid Panel With TSH   FSH   Hemoglobin A1c   LH    Other Visit Diagnoses    HIV disease (HCC)       Relevant Medications   bictegravir-emtricitabine-tenofovir AF (BIKTARVY) 50-200-25 MG TABS tablet   Need for immunization against influenza       Relevant Orders   Flu Vaccine QUAD 36+ mos IM (Completed)       Rexene Alberts, MSN, NP-C [facility] Pager: [phone] Office: [phone]  11/09/19  2:00 PM

## 2019-11-10 LAB — LUTEINIZING HORMONE: LH: 4.2 m[IU]/mL

## 2019-11-10 LAB — URINE CYTOLOGY ANCILLARY ONLY
Chlamydia: NEGATIVE
Comment: NEGATIVE
Comment: NEGATIVE
Comment: NORMAL
Neisseria Gonorrhea: POSITIVE — AB
Trichomonas: POSITIVE — AB

## 2019-11-10 LAB — HEMOGLOBIN A1C
Hgb A1c MFr Bld: 5.2 % of total Hgb (ref <5.7)
Mean Plasma Glucose: 103 (calc)
eAG (mmol/L): 5.7 (calc)

## 2019-11-10 LAB — THYROID PANEL WITH TSH
Free Thyroxine Index: 1.8 (ref 1.4–3.8)
T3 Uptake: 30 % (ref 22–35)
T4, Total: 6.1 ug/dL (ref 5.1–11.9)
TSH: 0.77 mIU/L

## 2019-11-10 LAB — FOLLICLE STIMULATING HORMONE: FSH: 4.9 m[IU]/mL

## 2019-11-13 ENCOUNTER — Other Ambulatory Visit: Payer: Self-pay

## 2019-11-13 DIAGNOSIS — A599 Trichomoniasis, unspecified: Secondary | ICD-10-CM

## 2019-11-13 MED ORDER — METRONIDAZOLE 500 MG PO TABS: 500.0000 mg | ORAL_TABLET | Freq: Two times a day (BID) | ORAL | 0 refills | Status: AC

## 2019-11-13 NOTE — Telephone Encounter (Signed)
Called patient regarding mychart message. Patient is scheduled for nurse visit tomorrow at 9 am. Metronidazole was sent to patient's preferred pharmacy. Patient would like to schedule PAP smear before 3 month follow up. States she is really concerned regarding irregular periods.  Will forward message to provider to advise if okay to schedule pap smear earlier. Lorenso Courier, [place]

## 2019-11-13 NOTE — Addendum Note (Signed)
Addended by: Blanchard Kelch on: 11/13/2019 09:44 AM   Modules accepted: Orders

## 2019-11-14 ENCOUNTER — Other Ambulatory Visit: Payer: Self-pay

## 2019-11-14 ENCOUNTER — Ambulatory Visit (INDEPENDENT_AMBULATORY_CARE_PROVIDER_SITE_OTHER): Payer: Self-pay

## 2019-11-14 DIAGNOSIS — A549 Gonococcal infection, unspecified: Secondary | ICD-10-CM

## 2019-11-14 MED ORDER — CEFTRIAXONE SODIUM 500 MG IJ SOLR
500.0000 mg | Freq: Once | INTRAMUSCULAR | Status: AC
  Administered 2019-11-14: 500 mg via INTRAMUSCULAR

## 2019-11-24 ENCOUNTER — Encounter: Payer: Self-pay | Admitting: Infectious Diseases

## 2019-12-05 ENCOUNTER — Ambulatory Visit: Payer: Medicaid Other | Admitting: Infectious Diseases

## 2019-12-26 ENCOUNTER — Ambulatory Visit (INDEPENDENT_AMBULATORY_CARE_PROVIDER_SITE_OTHER): Payer: Self-pay | Admitting: Infectious Diseases

## 2019-12-26 ENCOUNTER — Encounter: Payer: Self-pay | Admitting: Infectious Diseases

## 2019-12-26 ENCOUNTER — Other Ambulatory Visit: Payer: Self-pay

## 2019-12-26 VITALS — BP 165/95 | HR 64 | Temp 97.5°F | Ht 62.0 in | Wt 236.0 lb

## 2019-12-26 DIAGNOSIS — N939 Abnormal uterine and vaginal bleeding, unspecified: Secondary | ICD-10-CM

## 2019-12-26 DIAGNOSIS — N912 Amenorrhea, unspecified: Secondary | ICD-10-CM

## 2019-12-26 DIAGNOSIS — Z124 Encounter for screening for malignant neoplasm of cervix: Secondary | ICD-10-CM | POA: Insufficient documentation

## 2019-12-26 DIAGNOSIS — Z113 Encounter for screening for infections with a predominantly sexual mode of transmission: Secondary | ICD-10-CM

## 2019-12-26 NOTE — Assessment & Plan Note (Signed)
Normal pelvic exam, difficult to see cervix.  Cervical brushing collected for cytology and HPV.  Discussed recommended screening interval for women living with HIV disease meant to be lifelong and at an interval of Q1-3 years pending results. Acceptable to space out Q3y with 3 consecutively normal exams. Further recommendations for Catherine Simmons to follow today's results.  Results will be communicated to the patient via mychart.

## 2019-12-26 NOTE — Progress Notes (Signed)
      Subjective:    Catherine Simmons is a 34 y.o. female here for an pelvic exam and pap smear.    Review of Systems: Current GYN complaints or concerns: now with a heavy period, just finished a cycle last week and now again she has a heavy period. Already had to change her tampon this morning 1 hour into wear.  Moderate abdominal cramping. +fatigue, no dizziness.   Patient denies any abdominal/pelvic pain, problems with bowel movements, urination, vaginal discharge or intercourse.    Past Medical History:  Diagnosis Date  . Anemia   . Cholecystitis 07/16/10  . Genital warts 2004  . Hemorrhoids   . HIV (human immunodeficiency virus infection) (HCC)   . Hx of pelvic inflammatory disease 08/31/2013   And hx of multiple STDs   . Hypertension   . Pregnancy induced hypertension   . Sickle cell trait [facility])     Gynecologic History: [id number]  No LMP recorded. (Menstrual status: Irregular Periods). Contraception: condoms Last Pap: 05/12/2016. Results were: normal Anal Intercourse:  Last Mammogram: n/a  Objective:  Physical Exam  Constitutional: Well developed, well nourished, no acute distress. She is alert and oriented x3.  Pelvic: External genitalia is normal in appearance. Blood present. The vagina is normal in appearance. The cervix is not easily visualized today. Mild cramping with speculum exam. No CMT, thin appearing blood in vault today.  Psych: She has a normal mood and affect.    Assessment & Plan:    Problem List Items Addressed This Visit      Unprioritized   Screening for cervical cancer    Normal pelvic exam, difficult to see cervix.  Cervical brushing collected for cytology and HPV.  Discussed recommended screening interval for women living with HIV disease meant to be lifelong and at an interval of Q1-3 years pending results. Acceptable to space out Q3y with 3 consecutively normal exams. Further recommendations for Catherine Simmons to follow today's results.   Results will be communicated to the patient via mychart.        Relevant Orders   Cytology - PAP( [place])   Amenorrhea    All previous labs unremarkable for explaination. Now she is back to heavy frequent bleeding. Has appt with GYN in a few weeks to evaluate. Appreciate their help.       Abnormal uterine bleeding - Primary    Has had 2 cycles in close proximity that are described to be pretty heavy in quality, similar to what she experienced in the past. She has an appointment coming up with GYN in a few weeks to discuss.  I will have her start back an iron supplement given her history of anemia r/t chronic blood loss and heavy menses. She did have some success in the past with oral contraceptive with regards to improving her bleeding. Sounds like she was unable to have a mirena passed with her more recent pregnancy due to anatomy of cervix (seems tilted posteriorly).        Other Visit Diagnoses    Routine screening for STI (sexually transmitted infection)          Catherine Alberts, MSN, NP-C [facility]  [facility] Office: [phone] Pager: [phone]  12/26/19 10:09 AM

## 2019-12-26 NOTE — Assessment & Plan Note (Signed)
All previous labs unremarkable for explaination. Now she is back to heavy frequent bleeding. Has appt with GYN in a few weeks to evaluate. Appreciate their help.

## 2019-12-26 NOTE — Assessment & Plan Note (Signed)
Has had 2 cycles in close proximity that are described to be pretty heavy in quality, similar to what she experienced in the past. She has an appointment coming up with GYN in a few weeks to discuss.  I will have her start back an iron supplement given her history of anemia r/t chronic blood loss and heavy menses. She did have some success in the past with oral contraceptive with regards to improving her bleeding. Sounds like she was unable to have a mirena passed with her more recent pregnancy due to anatomy of cervix (seems tilted posteriorly).

## 2019-12-26 NOTE — Patient Instructions (Addendum)
For the pain try taking 800 mg ibuprofen (4 of the over the counter tablets) every 8 hours with food consistently for the next 3-5 days.   Will let you know what your results show on mychart   I want you to start back an iron supplement once a day in the morning. This will hopefully help prevent your iron and blood counts from dropping.  Please come back to see Judeth Cornfield for routine care in 4 months

## 2019-12-29 LAB — CYTOLOGY - PAP
Chlamydia: NEGATIVE
Comment: NEGATIVE
Comment: NEGATIVE
Comment: NORMAL
Diagnosis: NEGATIVE
High risk HPV: NEGATIVE
Neisseria Gonorrhea: NEGATIVE

## 2020-01-11 ENCOUNTER — Ambulatory Visit: Payer: Medicaid Other | Admitting: Obstetrics & Gynecology

## 2020-04-24 NOTE — Progress Notes (Deleted)
Name: Catherine Simmons  DOB: [birth year] MRN: [id number] PCP: Catherine Bock, DO    Patient Active Problem List   Diagnosis Date Noted  . Screening for cervical cancer 12/26/2019  . Amenorrhea 11/09/2019  . Abnormal uterine bleeding 05/13/2018  . Iron deficiency anemia 05/13/2018  . Fatigue 01/03/2018  . Sickle cell trait (HCC) 07/22/2016  . BMI 40.0-44.9, adult (HCC) 07/22/2016  . Hidradenitis suppurativa 05/13/2016  . Marijuana use 12/26/2014  . Chronic hypertension 03/19/2014  . Anxiety 06/30/2013  . Human immunodeficiency virus (HIV) disease (HCC) 10/12/2012  . CARPAL TUNNEL SYNDROME 11/14/2009  . Adjustment disorder with depressed mood 05/09/2008     Subjective:   No chief complaint on file.     HPI: Biggest concern for her today is that she has had no period now for a few months. Last period started June 28th. Has been taking pregnancy tests frequently. Has only had unprotected sex once with her partner. Denies any excessive thirst, skin changes, weight changes, constipation, hair changes. Mom had periods into her 33s. Terminated pregnancy in January of this with medical therapy - had periods regularly (long and heavy) up until June when they stopped completely.   Last dose of Biktarvy was somewhere in April, but she was taking this 2-3 doses a week to stretch out her medications. Last consistent use was probably around December. Tried to pick it up around March or so and the cost was > $3000. Never called the clinic as she got busy with her family / mom duties and taking care of everyone else.    Review of Systems  Constitutional: Negative for chills, diaphoresis and unexpected weight change.  Respiratory: Negative for shortness of breath.   Cardiovascular: Negative for chest pain.  Gastrointestinal: Negative for abdominal distention, abdominal pain, blood in stool and rectal pain.  Genitourinary: Negative for dyspareunia, dysuria, genital sores, menstrual  problem, pelvic pain, urgency and vaginal discharge.  Skin: Negative for rash.  Psychiatric/Behavioral: Negative for dysphoric mood and sleep disturbance. The patient is not nervous/anxious.       Past Medical History:  Diagnosis Date  . Anemia   . Cholecystitis 07/16/10  . Genital warts 2004  . Hemorrhoids   . HIV (human immunodeficiency virus infection) (HCC)   . Hx of pelvic inflammatory disease 08/31/2013   And hx of multiple STDs   . Hypertension   . Pregnancy induced hypertension   . Sickle cell trait [facility])     Outpatient Medications Prior to Visit  Medication Sig Dispense Refill  . bictegravir-emtricitabine-tenofovir AF (BIKTARVY) 50-200-25 MG TABS tablet Take 1 tablet by mouth daily. 30 tablet 11  . famotidine (PEPCID) 20 MG tablet Take 1 tablet (20 mg total) by mouth 2 (two) times daily. (Patient not taking: Reported on 11/09/2019) 30 tablet 1  . hydrochlorothiazide (HYDRODIURIL) 25 MG tablet Take 1 tablet (25 mg total) by mouth daily. 30 tablet 3  . metoCLOPramide (REGLAN) 10 MG tablet Take 1 tablet (10 mg total) by mouth every 6 (six) hours. (Patient not taking: Reported on 11/09/2019) 30 tablet 1  . Prenatal Vit-Fe Fumarate-FA (PREPLUS) 27-1 MG TABS Take 1 tablet by mouth daily. (Patient not taking: Reported on 11/09/2019) 30 tablet 13   No facility-administered medications prior to visit.     Allergies  Allergen Reactions  . Butorphanol Itching    Tolerates percocet  . Stadol [Butorphanol Tartrate] Itching    Tolerates percocet    Social History   Tobacco Use  . Smoking status:  Current Some Day Smoker    Packs/day: 0.10    Types: Cigarettes    Start date: 03/11/2012    Last attempt to quit: 12/11/2015    Years since quitting: 4.3  . Smokeless tobacco: Never Used  . Tobacco comment: 1-2 cigarettes a day   Vaping Use  . Vaping Use: Never used  Substance Use Topics  . Alcohol use: Yes    Comment: occ  . Drug use: Yes    Frequency: 5.0 times per week     Types: Marijuana    Comment: occ    Family History  Problem Relation Age of Onset  . Cancer Mother   . Hypertension Mother   . Diabetes Mother   . Pancreatitis Mother   . Hypertension Father   . Diabetes Maternal Aunt     Social History   Substance and Sexual Activity  Sexual Activity Not Currently  . Partners: Male  . Birth control/protection: Implant   Comment: given condoms 11/09/19     Objective:   There were no vitals filed for this visit. There is no height or weight on file to calculate BMI.  Physical Exam Vitals signs reviewed.  Constitutional:      General: She is not in acute distress.    Appearance: She is not ill-appearing.     Comments: Seated comfortably in the chair in no distress.  She had one episode where she felt her left arm numbness, chest tightness, shortness of breath.  This heard  after discussions about her mother's passing.  HENT:     Mouth/Throat:     Mouth: Mucous membranes are moist.     Pharynx: Oropharynx is clear.  Eyes:     General: No scleral icterus.    Pupils: Pupils are equal, round, and reactive to light.  Cardiovascular:     Rate and Rhythm: Normal rate and regular rhythm.     Pulses: Normal pulses.     Heart sounds: No murmur. No friction rub.  Pulmonary:     Effort: Pulmonary effort is normal.     Breath sounds: Normal breath sounds. No wheezing or rales.  Abdominal:     General: Bowel sounds are normal. There is no distension.  Musculoskeletal:        General: No swelling.  Skin:    General: Skin is warm and dry.     Capillary Refill: Capillary refill takes less than 2 seconds.  Neurological:     General: No focal deficit present.     Mental Status: She is alert and oriented to person, place, and time.  Psychiatric:     Comments: She is tearful multiple times during the conversation.  Thought content is normal.  Avoids conversations out her mother.     Lab Results Lab Results  Component Value Date   WBC 6.2  01/31/2019   HGB 11.2 (L) 01/31/2019   HCT 34.1 (L) 01/31/2019   MCV 80.4 01/31/2019   PLT 267 01/31/2019    Lab Results  Component Value Date   CREATININE 0.63 01/31/2019   BUN <5 (L) 01/31/2019   NA 136 01/31/2019   K 3.8 01/31/2019   CL 106 01/31/2019   CO2 24 01/31/2019    Lab Results  Component Value Date   ALT 20 01/31/2019   AST 16 01/31/2019   ALKPHOS 64 01/31/2019   BILITOT 0.7 01/31/2019    Lab Results  Component Value Date   CHOL 153 04/27/2018   HDL 57 04/27/2018  LDLCALC 82 04/27/2018   TRIG 63 04/27/2018   CHOLHDL 2.7 04/27/2018   HIV 1 RNA Quant  Date Value  10/19/2019 <20 Copies/mL  08/04/2018 <20 NOT DETECTED copies/mL  04/27/2018 4,010 copies/mL (H)   HIV-1 RNA Viral Load  Date Value  11/19/2016 <20 copies/mL  05/15/2014 <40  04/09/2014 <40   CD4 (no units)  Date Value  04/09/2014 1,016  02/19/2014 776  12/27/2013 651   CD4 T Cell Abs (/uL)  Date Value  10/19/2019 1,026  04/27/2018 1,030  04/01/2017 800     Assessment & Plan:   Problem List Items Addressed This Visit   None      Rexene Alberts, MSN, NP-C [facility] Pager: [phone] Office: [phone]  04/24/20  9:41 PM

## 2020-04-25 ENCOUNTER — Ambulatory Visit: Payer: Self-pay | Admitting: Infectious Diseases

## 2020-04-29 ENCOUNTER — Emergency Department (HOSPITAL_COMMUNITY)
Admission: EM | Admit: 2020-04-29 | Discharge: 2020-04-29 | Disposition: A | Discharge status: Home / Self Care | Payer: Medicaid Other | Attending: Emergency Medicine | Admitting: Emergency Medicine

## 2020-04-29 ENCOUNTER — Emergency Department (HOSPITAL_COMMUNITY): Payer: Medicaid Other

## 2020-04-29 ENCOUNTER — Other Ambulatory Visit: Payer: Self-pay

## 2020-04-29 ENCOUNTER — Ambulatory Visit (HOSPITAL_COMMUNITY)
Admission: EM | Admit: 2020-04-29 | Discharge: 2020-04-29 | Disposition: A | Discharge status: Home / Self Care | Payer: Medicaid Other

## 2020-04-29 ENCOUNTER — Encounter (HOSPITAL_COMMUNITY): Payer: Self-pay | Admitting: Emergency Medicine

## 2020-04-29 DIAGNOSIS — F17210 Nicotine dependence, cigarettes, uncomplicated: Secondary | ICD-10-CM | POA: Insufficient documentation

## 2020-04-29 DIAGNOSIS — Z21 Asymptomatic human immunodeficiency virus [HIV] infection status: Secondary | ICD-10-CM | POA: Insufficient documentation

## 2020-04-29 DIAGNOSIS — Z79899 Other long term (current) drug therapy: Secondary | ICD-10-CM | POA: Insufficient documentation

## 2020-04-29 DIAGNOSIS — I10 Essential (primary) hypertension: Secondary | ICD-10-CM | POA: Diagnosis not present

## 2020-04-29 DIAGNOSIS — R109 Unspecified abdominal pain: Secondary | ICD-10-CM | POA: Diagnosis present

## 2020-04-29 DIAGNOSIS — R1110 Vomiting, unspecified: Secondary | ICD-10-CM | POA: Diagnosis not present

## 2020-04-29 DIAGNOSIS — K760 Fatty (change of) liver, not elsewhere classified: Secondary | ICD-10-CM

## 2020-04-29 DIAGNOSIS — R112 Nausea with vomiting, unspecified: Secondary | ICD-10-CM

## 2020-04-29 LAB — LIPASE, BLOOD: Lipase: 30 U/L (ref 11–51)

## 2020-04-29 LAB — COMPREHENSIVE METABOLIC PANEL
ALT: 19 U/L (ref 0–44)
AST: 20 U/L (ref 15–41)
Albumin: 3.6 g/dL (ref 3.5–5.0)
Alkaline Phosphatase: 63 U/L (ref 38–126)
Anion gap: 9 (ref 5–15)
BUN: 12 mg/dL (ref 6–20)
CO2: 24 mmol/L (ref 22–32)
Calcium: 8.6 mg/dL — ABNORMAL LOW (ref 8.9–10.3)
Chloride: 104 mmol/L (ref 98–111)
Creatinine, Ser: 0.67 mg/dL (ref 0.44–1.00)
GFR, Estimated: >60 mL/min (ref >60)
Glucose, Bld: 126 mg/dL — ABNORMAL HIGH (ref 70–99)
Potassium: 3.8 mmol/L (ref 3.5–5.1)
Sodium: 137 mmol/L (ref 135–145)
Total Bilirubin: 0.6 mg/dL (ref 0.3–1.2)
Total Protein: 6.9 g/dL (ref 6.5–8.1)

## 2020-04-29 LAB — URINALYSIS, ROUTINE W REFLEX MICROSCOPIC
Bilirubin Urine: NEGATIVE
Glucose, UA: NEGATIVE mg/dL
Hgb urine dipstick: NEGATIVE
Ketones, ur: NEGATIVE mg/dL
Leukocytes,Ua: NEGATIVE
Nitrite: NEGATIVE
Protein, ur: NEGATIVE mg/dL
Specific Gravity, Urine: 1.019 (ref 1.005–1.030)
pH: 6 (ref 5.0–8.0)

## 2020-04-29 LAB — CBC
HCT: 36.3 % (ref 36.0–46.0)
Hemoglobin: 12.4 g/dL (ref 12.0–15.0)
MCH: 27.9 pg (ref 26.0–34.0)
MCHC: 34.2 g/dL (ref 30.0–36.0)
MCV: 81.6 fL (ref 80.0–100.0)
Platelets: 224 10*3/uL (ref 150–400)
RBC: 4.45 MIL/uL (ref 3.87–5.11)
RDW: 14 % (ref 11.5–15.5)
WBC: 5.4 10*3/uL (ref 4.0–10.5)
nRBC: 0 % (ref 0.0–0.2)

## 2020-04-29 LAB — I-STAT BETA HCG BLOOD, ED (MC, WL, AP ONLY): I-stat hCG, quantitative: <5 m[IU]/mL (ref <5)

## 2020-04-29 IMAGING — CT CT ABD-PELV W/ CM
2 of 4 series · 17 of 46 positions shown, 19 images · IV contrast (APPLIED)
Comparison: [DATE]

CLINICAL DATA: LEFT side abdominal pain with vomiting since [J2]
hours today, suspected diverticulitis; history HIV, hypertension,
PID, smoker

EXAM:
CT ABDOMEN AND PELVIS WITH CONTRAST
TECHNIQUE: Multidetector CT imaging of the abdomen and pelvis was performed
using the standard protocol following bolus administration of
intravenous contrast. Sagittal and coronal MPR images reconstructed
from axial data set.
CONTRAST:  100mL OMNIPAQUE IOHEXOL 300 MG/ML SOLN IV. No oral
contrast.

[Series 3: abdomen 5.0 · axial · 0.98mm/px · z∈[-646,-226]mm · 14 of 96 slices shown, 16 images]
[im 6/96  soft-tissue]
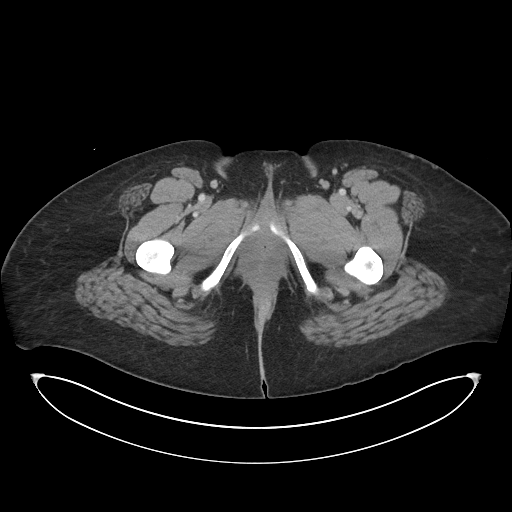
[im 6/96  bone]
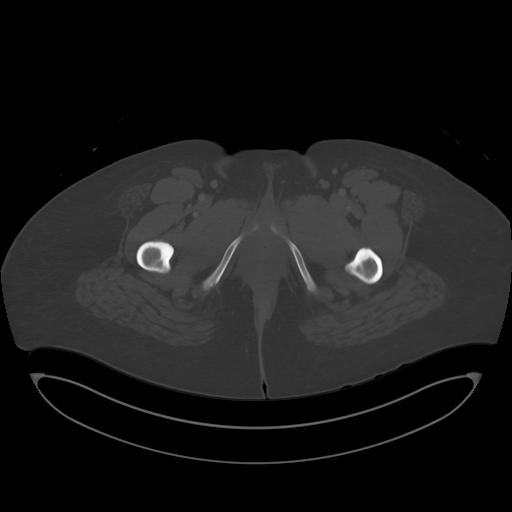
[im 12/96  soft-tissue]
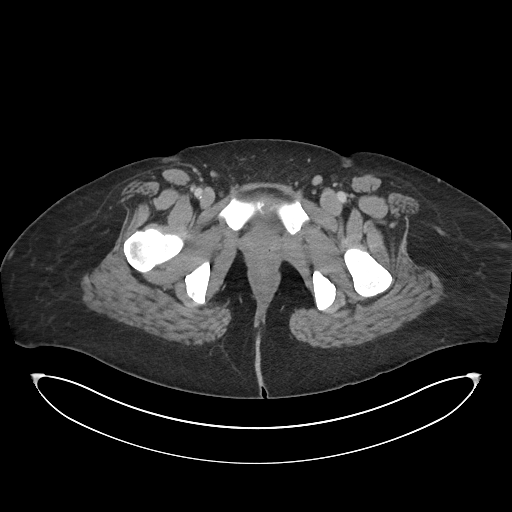
[im 18/96  soft-tissue]
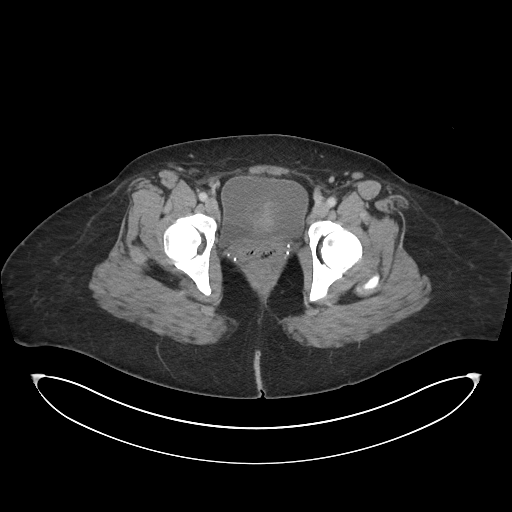
[im 24/96  soft-tissue]
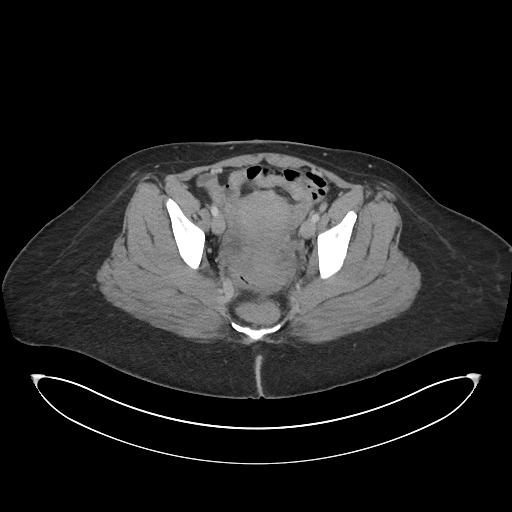
[im 30/96  soft-tissue]
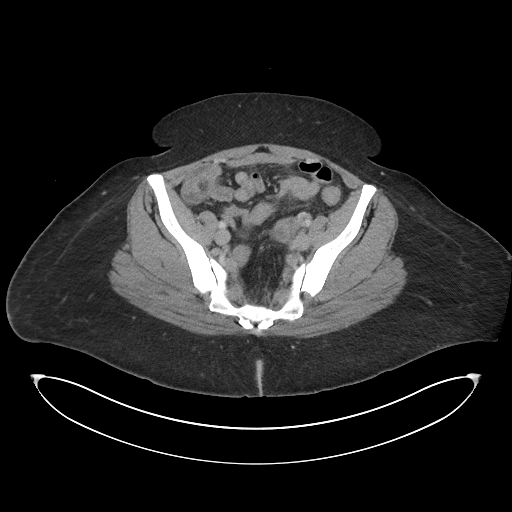
[im 36/96  soft-tissue]
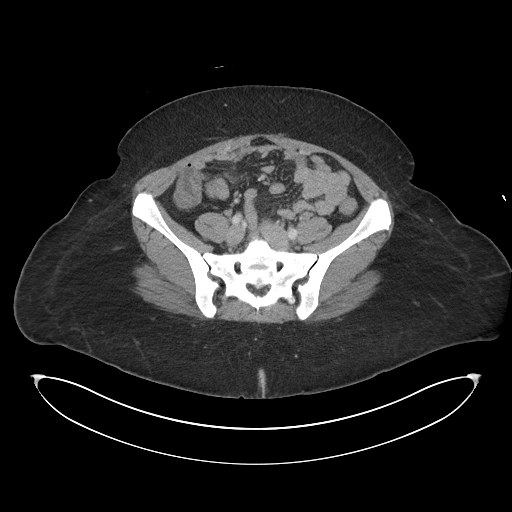
[im 42/96  soft-tissue]
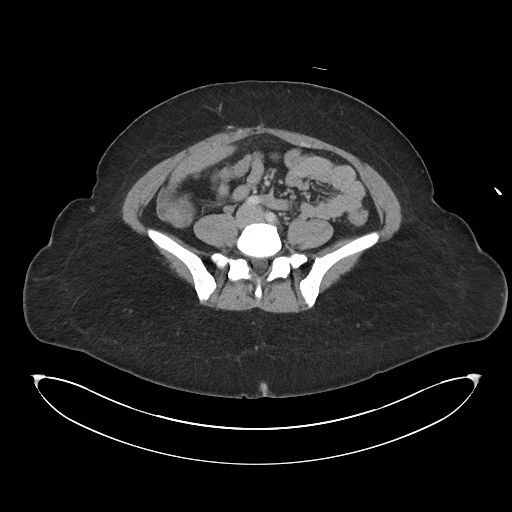
[im 54/96  soft-tissue]
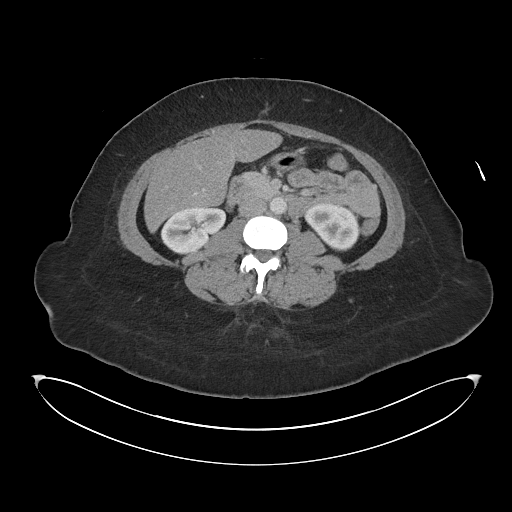
[im 60/96  soft-tissue]
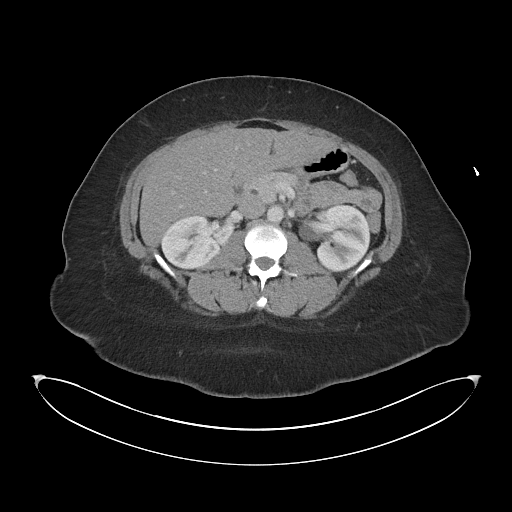
[im 60/96  bone]
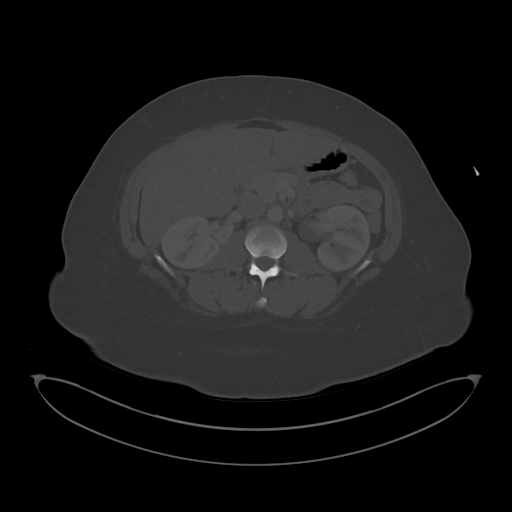
[im 66/96  soft-tissue]
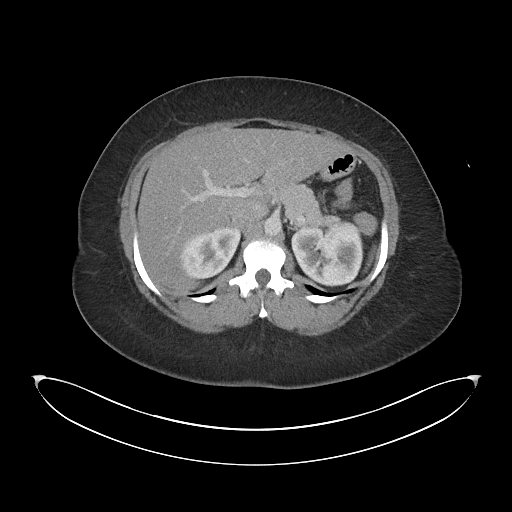
[im 72/96  soft-tissue]
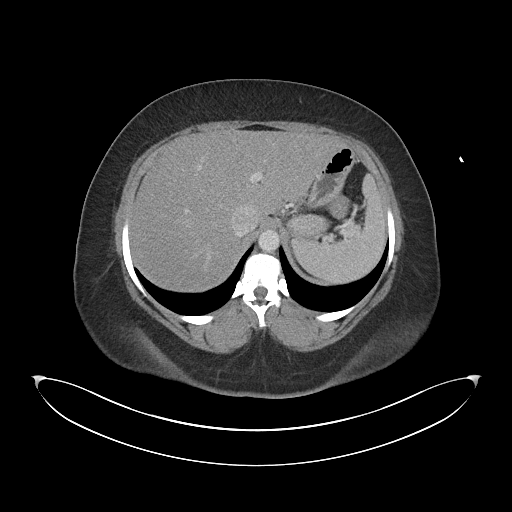
[im 78/96  soft-tissue]
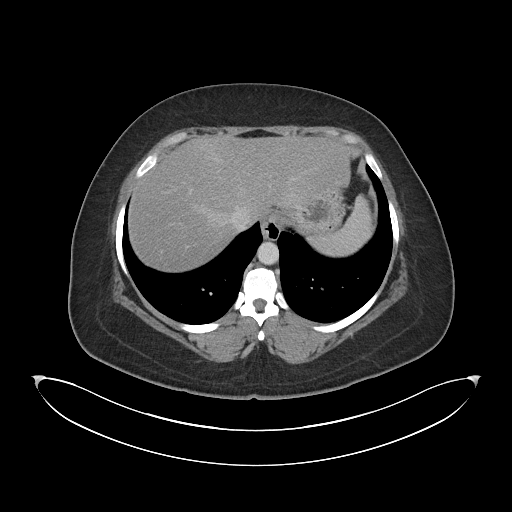
[im 84/96  soft-tissue]
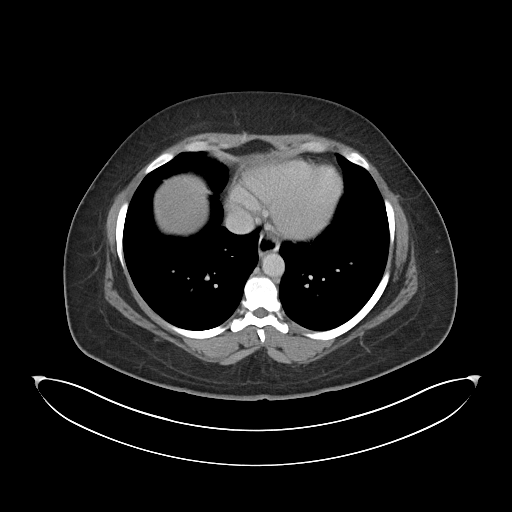
[im 90/96  soft-tissue]
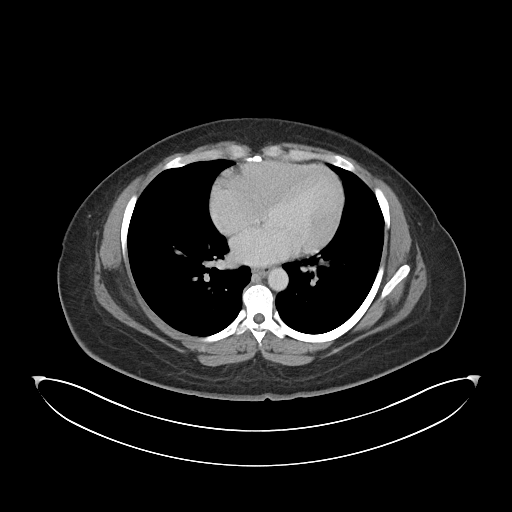

[Series 6: abdomen 3.0 mpr cor · coronal · 0.99mm/px · 3 of 104 slices shown]
[im 35/104  soft-tissue]
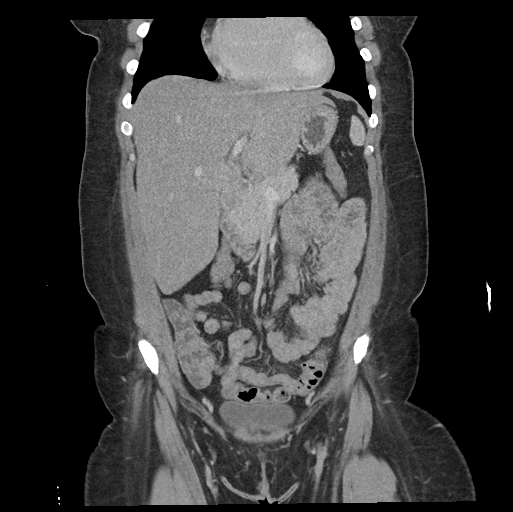
[im 46/104  soft-tissue]
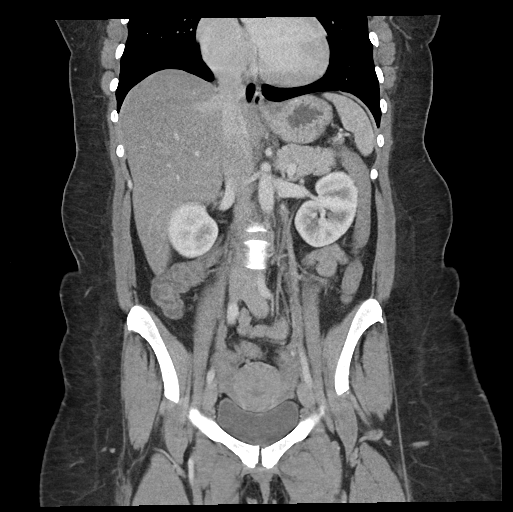
[im 58/104  soft-tissue]
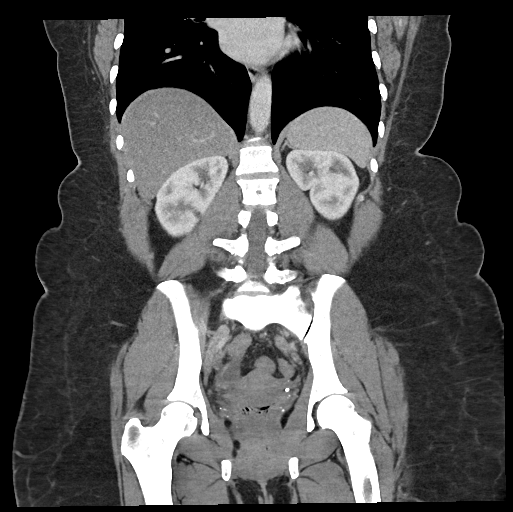

[17 of 46 positions shown; findings below may reference images not displayed]

FINDINGS: Lower chest: Lung bases clear

Hepatobiliary: Mild diffuse fatty infiltration of liver. No focal
hepatic abnormality. Gallbladder surgically absent. No biliary
dilatation.

Pancreas: Normal appearance

Spleen: Normal appearance

Adrenals/Urinary Tract: Small RIGHT renal cyst unchanged. Adrenal
glands, kidneys, ureters, and bladder otherwise normal appearance.

Stomach/Bowel: Normal appendix. Stomach and bowel loops normal
appearance.

Vascular/Lymphatic: Vascular structures patent. No adenopathy.
Scattered pelvic phleboliths.

Reproductive: Uterus unremarkable. Dominant follicle RIGHT ovary
cm diameter; No follow-up imaging recommended. Note: This
recommendation does not apply to premenarchal patients and to those
with increased risk (genetic, family history, elevated tumor markers
or other high-risk factors) of ovarian cancer. Reference: JACR [DATE]):[id number]. Small corpus luteum in LEFT ovary; no follow-up
imaging recommended.

Other: No free air or free fluid. No hernia or inflammatory process.

Musculoskeletal: No acute osseous findings.
IMPRESSION: Mild diffuse fatty infiltration of liver.

No acute intra-abdominal or intrapelvic abnormalities.

## 2020-04-29 MED ORDER — SODIUM CHLORIDE 0.9 % IV BOLUS
1000.0000 mL | Freq: Once | INTRAVENOUS | Status: AC
  Administered 2020-04-29: 1000 mL via INTRAVENOUS

## 2020-04-29 MED ORDER — ONDANSETRON HCL 4 MG/2ML IJ SOLN
4.0000 mg | Freq: Once | INTRAMUSCULAR | Status: AC
  Administered 2020-04-29: 4 mg via INTRAVENOUS
  Filled 2020-04-29: qty 2

## 2020-04-29 MED ORDER — FENTANYL CITRATE (PF) 100 MCG/2ML IJ SOLN
50.0000 ug | Freq: Once | INTRAMUSCULAR | Status: AC
  Administered 2020-04-29: 50 ug via INTRAVENOUS
  Filled 2020-04-29: qty 2

## 2020-04-29 MED ORDER — PANTOPRAZOLE SODIUM 40 MG IV SOLR
40.0000 mg | Freq: Once | INTRAVENOUS | Status: AC
  Administered 2020-04-29: 40 mg via INTRAVENOUS
  Filled 2020-04-29: qty 40

## 2020-04-29 MED ORDER — ONDANSETRON HCL 4 MG PO TABS: 4.0000 mg | ORAL_TABLET | Freq: Four times a day (QID) | ORAL | 0 refills | Status: DC

## 2020-04-29 MED ORDER — IOHEXOL 300 MG/ML  SOLN
100.0000 mL | Freq: Once | INTRAMUSCULAR | Status: AC | PRN
  Administered 2020-04-29: 100 mL via INTRAVENOUS

## 2020-04-29 MED ORDER — PANTOPRAZOLE SODIUM 20 MG PO TBEC: 20.0000 mg | DELAYED_RELEASE_TABLET | Freq: Every day | ORAL | 0 refills | Status: DC

## 2020-04-29 NOTE — ED Notes (Signed)
Pt successfully passed fluid challenge.

## 2020-04-29 NOTE — ED Provider Notes (Signed)
Care assumed from [facility] [place] at shift change.  See his note for full H&P.  Per his note, "Catherine Simmons is a 35 y.o. female history of obesity, HIV, hypertension.  Patient presents today for left-sided abdominal pain onset 9 AM this morning.  Patient reports that she went out for a drink last night she reports she had "2 mixed drinks".  When she woke up this morning she felt left-sided abdominal pain this was moderate intensity burning constant nonradiating no clear aggravating or alleviating factors.  Shortly afterwards she began to vomit she reports multiple episodes of emesis today with most recent episodes being streaked with blood.  Denies fever/chills, fall/injury, chest pain/shortness of breath, diarrhea, dysuria/hematuria, vaginal bleeding/discharge, concern for STI or any additional concerns."  Left sided abd pain, nv, drank last night, pending ct scan Getting protonix pain and nausea meds If neg, po chal andedc with protonix hiv well controlled  Physical Exam  BP (!) 155/101 (BP Location: Left Arm)   Pulse (!) 54   Temp 98.2 F (36.8 C) (Oral)   Resp 18   SpO2 100%   Physical Exam Vitals and nursing note reviewed.  Constitutional:      General: She is not in acute distress.    Appearance: She is well-developed.  HENT:     Head: Normocephalic and atraumatic.  Eyes:     Conjunctiva/sclera: Conjunctivae normal.  Cardiovascular:     Rate and Rhythm: Normal rate.  Pulmonary:     Effort: Pulmonary effort is normal.  Abdominal:     Comments: Mild ttp to the luq and llq without guarding or rebound ttp  Musculoskeletal:        General: Normal range of motion.     Cervical back: Neck supple.  Skin:    General: Skin is warm and dry.  Neurological:     Mental Status: She is alert.     ED Course/Procedures     Procedures  Results for orders placed or performed during the hospital encounter of 04/29/20  Lipase, blood  Result Value Ref Range   Lipase  30 11 - 51 U/L  Comprehensive metabolic panel  Result Value Ref Range   Sodium 137 135 - 145 mmol/L   Potassium 3.8 3.5 - 5.1 mmol/L   Chloride 104 98 - 111 mmol/L   CO2 24 22 - 32 mmol/L   Glucose, Bld 126 (H) 70 - 99 mg/dL   BUN 12 6 - 20 mg/dL   Creatinine, Ser 7.06 0.44 - 1.00 mg/dL   Calcium 8.6 (L) 8.9 - 10.3 mg/dL   Total Protein 6.9 6.5 - 8.1 g/dL   Albumin 3.6 3.5 - 5.0 g/dL   AST 20 15 - 41 U/L   ALT 19 0 - 44 U/L   Alkaline Phosphatase 63 38 - 126 U/L   Total Bilirubin 0.6 0.3 - 1.2 mg/dL   GFR, Estimated >23 >76 mL/min   Anion gap 9 5 - 15  CBC  Result Value Ref Range   WBC 5.4 4.0 - 10.5 K/uL   RBC 4.45 3.87 - 5.11 MIL/uL   Hemoglobin 12.4 12.0 - 15.0 g/dL   HCT 28.3 15.1 - 76.1 %   MCV 81.6 80.0 - 100.0 fL   MCH 27.9 26.0 - 34.0 pg   MCHC 34.2 30.0 - 36.0 g/dL   RDW 60.7 37.1 - 06.2 %   Platelets 224 150 - 400 K/uL   nRBC 0.0 0.0 - 0.2 %  Urinalysis, Routine w  reflex microscopic Urine, Clean Catch  Result Value Ref Range   Color, Urine YELLOW YELLOW   APPearance CLEAR CLEAR   Specific Gravity, Urine 1.019 1.005 - 1.030   pH 6.0 5.0 - 8.0   Glucose, UA NEGATIVE NEGATIVE mg/dL   Hgb urine dipstick NEGATIVE NEGATIVE   Bilirubin Urine NEGATIVE NEGATIVE   Ketones, ur NEGATIVE NEGATIVE mg/dL   Protein, ur NEGATIVE NEGATIVE mg/dL   Nitrite NEGATIVE NEGATIVE   Leukocytes,Ua NEGATIVE NEGATIVE  I-Stat beta hCG blood, ED  Result Value Ref Range   I-stat hCG, quantitative <5.0 <5 mIU/mL   Comment 3           CT ABDOMEN PELVIS W CONTRAST  Result Date: 04/29/2020 CLINICAL DATA:  LEFT side abdominal pain with vomiting since 0900 hours today, suspected diverticulitis; history HIV, hypertension, PID, smoker EXAM: CT ABDOMEN AND PELVIS WITH CONTRAST TECHNIQUE: Multidetector CT imaging of the abdomen and pelvis was performed using the standard protocol following bolus administration of intravenous contrast. Sagittal and coronal MPR images reconstructed from axial data  set. CONTRAST:  OMNIPAQUE IOHEXOL 300 MG/ML SOLN IV. No oral contrast. COMPARISON:  04/01/2020 FINDINGS: Lower chest: Lung bases clear Hepatobiliary: Mild diffuse fatty infiltration of liver. No focal hepatic abnormality. Gallbladder surgically absent. No biliary dilatation. Pancreas: Normal appearance Spleen: Normal appearance Adrenals/Urinary Tract: Small RIGHT renal cyst unchanged. Adrenal glands, kidneys, ureters, and bladder otherwise normal appearance. Stomach/Bowel: Normal appendix. Stomach and bowel loops normal appearance. Vascular/Lymphatic: Vascular structures patent. No adenopathy. Scattered pelvic phleboliths. Reproductive: Uterus unremarkable. Dominant follicle RIGHT ovary 2.3 cm diameter; No follow-up imaging recommended. Note: This recommendation does not apply to premenarchal patients and to those with increased risk (genetic, family history, elevated tumor markers or other high-risk factors) of ovarian cancer. Reference: JACR 2020 Feb; 17(2):248-254. Small corpus luteum in LEFT ovary; no follow-up imaging recommended. Other: No free air or free fluid. No hernia or inflammatory process. Musculoskeletal: No acute osseous findings. IMPRESSION: Mild diffuse fatty infiltration of liver. No acute intra-abdominal or intrapelvic abnormalities. Electronically Signed   By: [name] M.D.   On: 04/29/2020 20:38     MDM   35 year old female presenting emergency department today for evaluation of abdominal pain nausea vomiting.  At shift change, plan is to follow-up on pending CT scan if negative and patient can pass p.o. challenge feel she is appropriate for discharge home.  Prior provider felt that symptoms were most likely related to gastritis given patient's history of EtOH use last night.  On my evaluation her abdomen is soft and minimally tender.  No peritoneal signs.  She is nontoxic, nonseptic appearing.  Reviewed/interpreted labs and showed no evidence of a leukocytosis, normal  electrolytes, normal kidney and liver function.  Lipase is negative, UA is negative and beta hCG is negative.   No indication of appendicitis, bowel obstruction, bowel perforation, cholecystitis, diverticulitis, PID or ectopic pregnancy.  Patient discharged home with symptomatic treatment and given strict instructions for follow-up with their primary care physician.  I have also discussed reasons to return immediately to the ER.  Patient expresses understanding and agrees with plan.         Rayne Du 04/29/20 2119    Mancel Bale, MD 04/30/20 1036

## 2020-04-29 NOTE — ED Triage Notes (Incomplete)
Pt arrives to ED with chief complaint of lower abd pain with nausea and vomiting. She has vomitied more than 10 times.

## 2020-04-29 NOTE — Discharge Instructions (Addendum)
You were given a prescription for zofran to help with your nausea and pepcid to help with your abdominal pain. Please take as directed.  Please follow up with your primary doctor within the next 5-7 days.  If you do not have a primary care provider, information for a healthcare clinic has been provided for you to make arrangements for follow up care. Please return to the ER sooner if you have any new or worsening symptoms, or if you have any of the following symptoms:  Abdominal pain that does not go away.  You have a fever.  You keep throwing up (vomiting).  The pain is felt only in portions of the abdomen. Pain in the right side could possibly be appendicitis. In an adult, pain in the left lower portion of the abdomen could be colitis or diverticulitis.  You pass bloody or black tarry stools.  There is bright red blood in the stool.  The constipation stays for more than 4 days.  There is belly (abdominal) or rectal pain.  You do not seem to be getting better.  You have any questions or concerns.

## 2020-04-29 NOTE — ED Provider Notes (Signed)
[facility] EMERGENCY DEPARTMENT Provider Note   CSN: [id number] Arrival date & time: 04/29/20  1346     History Chief Complaint  Patient presents with  . Abdominal Pain    Catherine Simmons is a 35 y.o. female history of obesity, HIV, hypertension.  Patient presents today for left-sided abdominal pain onset 9 AM this morning.  Patient reports that she went out for a drink last night she reports she had "2 mixed drinks".  When she woke up this morning she felt left-sided abdominal pain this was moderate intensity burning constant nonradiating no clear aggravating or alleviating factors.  Shortly afterwards she began to vomit she reports multiple episodes of emesis today with most recent episodes being streaked with blood.  Denies fever/chills, fall/injury, chest pain/shortness of breath, diarrhea, dysuria/hematuria, vaginal bleeding/discharge, concern for STI or any additional concerns.  HPI     Past Medical History:  Diagnosis Date  . Anemia   . Cholecystitis 07/16/10  . Genital warts 2004  . Hemorrhoids   . HIV (human immunodeficiency virus infection) (HCC)   . Hx of pelvic inflammatory disease 08/31/2013   And hx of multiple STDs   . Hypertension   . Pregnancy induced hypertension   . Sickle cell trait [facility])     Patient Active Problem List   Diagnosis Date Noted  . Screening for cervical cancer 12/26/2019  . Amenorrhea 11/09/2019  . Abnormal uterine bleeding 05/13/2018  . Iron deficiency anemia 05/13/2018  . Fatigue 01/03/2018  . Sickle cell trait (HCC) 07/22/2016  . BMI 40.0-44.9, adult (HCC) 07/22/2016  . Hidradenitis suppurativa 05/13/2016  . Marijuana use 12/26/2014  . Chronic hypertension 03/19/2014  . Anxiety 06/30/2013  . Human immunodeficiency virus (HIV) disease (HCC) 10/12/2012  . CARPAL TUNNEL SYNDROME 11/14/2009  . Adjustment disorder with depressed mood 05/09/2008    Past Surgical History:  Procedure Laterality Date  .  CHOLECYSTECTOMY    . CHOLECYSTECTOMY  07/19/2010  . EXTERNAL CEPHALIC VERSION  12/11/2016      . WISDOM TOOTH EXTRACTION       OB History    Gravida  6   Para  5   Term  4   Preterm  1   AB      Living  5     SAB      IAB      Ectopic      Multiple  0   Live Births  5           Family History  Problem Relation Age of Onset  . Cancer Mother   . Hypertension Mother   . Diabetes Mother   . Pancreatitis Mother   . Hypertension Father   . Diabetes Maternal Aunt     Social History   Tobacco Use  . Smoking status: Current Some Day Smoker    Packs/day: 0.10    Types: Cigarettes    Start date: 03/11/2012    Last attempt to quit: 12/11/2015    Years since quitting: 4.3  . Smokeless tobacco: Never Used  . Tobacco comment: 1-2 cigarettes a day   Vaping Use  . Vaping Use: Never used  Substance Use Topics  . Alcohol use: Yes    Comment: occ  . Drug use: Yes    Frequency: 5.0 times per week    Types: Marijuana    Comment: occ    Home Medications Prior to Admission medications   Medication Sig Start Date End Date Taking? Authorizing Provider  bictegravir-emtricitabine-tenofovir AF (BIKTARVY) 50-200-25 MG TABS tablet Take 1 tablet by mouth daily. 11/09/19   Blanchard Kelch, NP  famotidine (PEPCID) 20 MG tablet Take 1 tablet (20 mg total) by mouth 2 (two) times daily. Patient not taking: Reported on 11/09/2019 02/02/19   Gerrit Heck, CNM  hydrochlorothiazide (HYDRODIURIL) 25 MG tablet Take 1 tablet (25 mg total) by mouth daily. 11/09/19   Blanchard Kelch, NP  metoCLOPramide (REGLAN) 10 MG tablet Take 1 tablet (10 mg total) by mouth every 6 (six) hours. Patient not taking: Reported on 11/09/2019 02/02/19   Gerrit Heck, CNM  Prenatal Vit-Fe Fumarate-FA (PREPLUS) 27-1 MG TABS Take 1 tablet by mouth daily. Patient not taking: Reported on 11/09/2019 01/31/19   Calvert Cantor, CNM    Allergies    Butorphanol and Stadol [butorphanol tartrate]  Review  of Systems   Review of Systems Ten systems are reviewed and are negative for acute change except as noted in the HPI  Physical Exam Updated Vital Signs BP (!) 176/99 (BP Location: Right Arm)   Pulse (!) 49   Temp 98.2 F (36.8 C) (Oral)   Resp 18   SpO2 100%   Physical Exam Constitutional:      General: She is not in acute distress.    Appearance: Normal appearance. She is well-developed. She is not ill-appearing or diaphoretic.  HENT:     Head: Normocephalic and atraumatic.  Eyes:     General: Vision grossly intact. Gaze aligned appropriately.     Pupils: Pupils are equal, round, and reactive to light.  Neck:     Trachea: Trachea and phonation normal.  Pulmonary:     Effort: Pulmonary effort is normal. No respiratory distress.  Abdominal:     General: There is no distension.     Palpations: Abdomen is soft.     Tenderness: There is abdominal tenderness in the left upper quadrant and left lower quadrant. There is no guarding or rebound.  Musculoskeletal:        General: Normal range of motion.     Cervical back: Normal range of motion.  Skin:    General: Skin is warm and dry.  Neurological:     Mental Status: She is alert.     GCS: GCS eye subscore is 4. GCS verbal subscore is 5. GCS motor subscore is 6.     Comments: Speech is clear and goal oriented, follows commands Major Cranial nerves without deficit, no facial droop Moves extremities without ataxia, coordination intact  Psychiatric:        Behavior: Behavior normal.     ED Results / Procedures / Treatments   Labs (all labs ordered are listed, but only abnormal results are displayed) Labs Reviewed  COMPREHENSIVE METABOLIC PANEL - Abnormal; Notable for the following components:      Result Value   Glucose, Bld 126 (*)    Calcium 8.6 (*)    All other components within normal limits  LIPASE, BLOOD  CBC  URINALYSIS, ROUTINE W REFLEX MICROSCOPIC  I-STAT BETA HCG BLOOD, ED ([facility], [facility], [facility] ONLY)     EKG None  Radiology No results found.  Procedures Procedures   Medications Ordered in ED Medications  sodium chloride 0.9 % bolus 1,000 mL (has no administration in time range)  fentaNYL (SUBLIMAZE) injection 50 mcg (has no administration in time range)    ED Course  I have reviewed the triage vital signs and the nursing notes.  Pertinent labs & imaging results that were  available during my care of the patient were reviewed by me and considered in my medical decision making (see chart for details).    MDM Rules/Calculators/A&P                         Additional history obtained from: 1. Nursing notes from this visit. 2. Review of electronic medical records.  Most recent CD4 counts from October 19, 2019 are within normal limits.  Most recent HIV quantitative was <20 October 19, 2019. ------------------ I ordered, reviewed and interpreted labs which include: CBC within normal limits, no leukocytosis or anemia. Lipase within normal limits, doubt pancreatitis. Pregnancy test negative. Urinalysis within normal limits. CMP shows no emergent electrolyte derangement, AKI or gap.  Patient's left abdominal pain and multiple episodes of emesis may be secondary to gastritis however given patient's history and diffuse left-sided tenderness on exam feel CT imaging is indicated to rule out diverticulitis or other emergent pathologies at this time.  Shared decision making made with patient, risk versus benefits of CT imaging were discussed and patient elected to proceed with CT imaging.  Will give patient patient pain/nausea control along with Protonix and fluid while awaiting imaging.  Care handoff given to Courtni Couture PA-C at shift change.  Plan of care is to follow-up on CT abdomen pelvis and reassess.  Pending no emergent findings possible discharge.  Final disposition per oncoming team.  Note: Portions of this report may have been transcribed using voice recognition software.  Every effort was made to ensure accuracy; however, inadvertent computerized transcription errors may still be present. Final Clinical Impression(s) / ED Diagnoses Final diagnoses:  None    Rx / DC Orders ED Discharge Orders    None        [place] 04/29/20 Randel Books, MD 04/30/20 [phone]

## 2020-06-20 ENCOUNTER — Other Ambulatory Visit (HOSPITAL_COMMUNITY)
Admission: RE | Admit: 2020-06-20 | Discharge: 2020-06-20 | Disposition: A | Discharge status: Home / Self Care | Payer: Medicaid Other | Source: Ambulatory Visit | Attending: Infectious Diseases | Admitting: Infectious Diseases

## 2020-06-20 ENCOUNTER — Ambulatory Visit (INDEPENDENT_AMBULATORY_CARE_PROVIDER_SITE_OTHER): Payer: Self-pay | Admitting: Infectious Diseases

## 2020-06-20 ENCOUNTER — Encounter: Payer: Self-pay | Admitting: Infectious Diseases

## 2020-06-20 ENCOUNTER — Other Ambulatory Visit: Payer: Self-pay

## 2020-06-20 VITALS — BP 158/97 | HR 63 | Resp 16 | Ht 62.0 in | Wt 220.0 lb

## 2020-06-20 DIAGNOSIS — Z113 Encounter for screening for infections with a predominantly sexual mode of transmission: Secondary | ICD-10-CM | POA: Insufficient documentation

## 2020-06-20 DIAGNOSIS — R1032 Left lower quadrant pain: Secondary | ICD-10-CM

## 2020-06-20 DIAGNOSIS — K760 Fatty (change of) liver, not elsewhere classified: Secondary | ICD-10-CM

## 2020-06-20 DIAGNOSIS — B20 Human immunodeficiency virus [HIV] disease: Secondary | ICD-10-CM

## 2020-06-20 DIAGNOSIS — B9689 Other specified bacterial agents as the cause of diseases classified elsewhere: Secondary | ICD-10-CM

## 2020-06-20 DIAGNOSIS — N760 Acute vaginitis: Secondary | ICD-10-CM

## 2020-06-20 DIAGNOSIS — R350 Frequency of micturition: Secondary | ICD-10-CM

## 2020-06-20 MED ORDER — OMEPRAZOLE 40 MG PO CPDR: 40.0000 mg | DELAYED_RELEASE_CAPSULE | Freq: Every day | ORAL | 1 refills | Status: DC

## 2020-06-20 MED ORDER — DICYCLOMINE HCL 10 MG PO CAPS: 10.0000 mg | ORAL_CAPSULE | Freq: Three times a day (TID) | ORAL | 0 refills | Status: DC

## 2020-06-20 NOTE — Assessment & Plan Note (Signed)
Adherence on Biktarvy seems to be much better. Will update pertinent labs today and screen for STIs with self collected vaginal swab (she deferred pelvic exam or me assisting with sampling).  Return in about 3 months (around 09/20/2020).

## 2020-06-20 NOTE — Assessment & Plan Note (Signed)
Fatty liver characteristics on CT scan with normal LFTs. Discussed dietary and lifestyle changes that would help to reduce this. Screen for diabetes today.

## 2020-06-20 NOTE — Progress Notes (Signed)
Name: Catherine Simmons  DOB: [birth year] MRN: [id number] PCP: Leeroy Bock, DO     Subjective:   Chief Complaint  Patient presents with  . Follow-up    B20 condoms offered and accepted/ Would like STI testing today    Abdominal pain    HPI: Catherine Simmons is here for follow-up regarding routine HIV care, concern for STI exposure and abdominal pain.  She is having LLQ pain for about 2 months. It comes and goes but when it is present can last up to a week. She is not having trouble with constipation with the assistance of Miralax. She does have some associated bloating, vomiting and unintentional weight loss of 30# over the last 8 months. Denies any blood or mucus in stool. Has had some intermittent vomiting with dark blood in it - has been about 3 weeks since she vomited last. She stopped drinking alcohol recently and confides that she was probably drinking too much at one time. Her mother had cirrhosis of the liver and passed 2 years ago from this.   Wt Readings from Last 3 Encounters:  06/20/20 220 lb (99.8 kg)  12/26/19 236 lb (107 kg)  11/09/19 249 lb (112.9 kg)    She has been doing better with biktarvy adherence and has no concerns with access to or side effects from mediation. She is not currently taking any OTC antacids for GI symptoms but has in the past for gastritis.    Review of Systems  Constitutional: Positive for unexpected weight change. Negative for chills and diaphoresis.  Respiratory: Negative for shortness of breath.   Cardiovascular: Negative for chest pain.  Gastrointestinal: Positive for abdominal distention, abdominal pain, nausea and vomiting. Negative for blood in stool and rectal pain.  Genitourinary: Negative for dyspareunia, dysuria, genital sores, menstrual problem, pelvic pain, urgency and vaginal discharge.  Skin: Negative for rash.  Psychiatric/Behavioral: Negative for dysphoric mood and sleep disturbance. The patient is not nervous/anxious.        Past Medical History:  Diagnosis Date  . Anemia   . Cholecystitis 07/16/10  . Genital warts 2004  . Hemorrhoids   . HIV (human immunodeficiency virus infection) (HCC)   . Hx of pelvic inflammatory disease 08/31/2013   And hx of multiple STDs   . Hypertension   . Pregnancy induced hypertension   . Sickle cell trait [facility])     Outpatient Medications Prior to Visit  Medication Sig Dispense Refill  . bictegravir-emtricitabine-tenofovir AF (BIKTARVY) 50-200-25 MG TABS tablet Take 1 tablet by mouth daily. 30 tablet 11  . hydrochlorothiazide (HYDRODIURIL) 25 MG tablet Take 1 tablet (25 mg total) by mouth daily. 30 tablet 3  . ondansetron (ZOFRAN) 4 MG tablet Take 1 tablet (4 mg total) by mouth every 6 (six) hours. 12 tablet 0  . famotidine (PEPCID) 20 MG tablet Take 1 tablet (20 mg total) by mouth 2 (two) times daily. (Patient not taking: Reported on 11/09/2019) 30 tablet 1  . metoCLOPramide (REGLAN) 10 MG tablet Take 1 tablet (10 mg total) by mouth every 6 (six) hours. (Patient not taking: Reported on 11/09/2019) 30 tablet 1  . pantoprazole (PROTONIX) 20 MG tablet Take 1 tablet (20 mg total) by mouth daily. 30 tablet 0  . Prenatal Vit-Fe Fumarate-FA (PREPLUS) 27-1 MG TABS Take 1 tablet by mouth daily. (Patient not taking: Reported on 11/09/2019) 30 tablet 13   No facility-administered medications prior to visit.     Allergies  Allergen Reactions  . Butorphanol Itching  Tolerates percocet  . Stadol [Butorphanol Tartrate] Itching    Tolerates percocet    Social History   Tobacco Use  . Smoking status: Current Some Day Smoker    Packs/day: 0.10    Types: Cigarettes    Start date: 03/11/2012    Last attempt to quit: 12/11/2015    Years since quitting: 4.5  . Smokeless tobacco: Never Used  . Tobacco comment: 1-2 cigarettes a day   Vaping Use  . Vaping Use: Never used  Substance Use Topics  . Alcohol use: Yes    Comment: occ  . Drug use: Yes    Frequency: 5.0 times per week     Types: Marijuana    Comment: occ     Social History   Substance and Sexual Activity  Sexual Activity Not Currently  . Partners: Male  . Birth control/protection: Implant   Comment: given condoms 11/09/19     Objective:   Vitals:   06/20/20 0932  BP: (!) 158/97  Pulse: 63  Resp: 16  SpO2: 99%  Weight: 220 lb (99.8 kg)  Height: 5\' 2"  (1.575 m)   Body mass index is 40.24 kg/m.  Physical Exam Vitals reviewed.  HENT:     Mouth/Throat:     Mouth: No oral lesions.     Dentition: No dental abscesses.  Cardiovascular:     Rate and Rhythm: Normal rate and regular rhythm.     Heart sounds: Normal heart sounds.  Pulmonary:     Effort: Pulmonary effort is normal.     Breath sounds: Normal breath sounds.  Abdominal:     General: There is distension.     Palpations: Abdomen is soft.     Tenderness: There is abdominal tenderness.  Musculoskeletal:        General: No tenderness. Normal range of motion.  Lymphadenopathy:     Cervical: No cervical adenopathy.  Skin:    General: Skin is warm and dry.     Findings: No rash.  Neurological:     Mental Status: She is alert and oriented to person, place, and time.  Psychiatric:        Judgment: Judgment normal.      Lab Results Lab Results  Component Value Date   WBC 5.4 04/29/2020   HGB 12.4 04/29/2020   HCT 36.3 04/29/2020   MCV 81.6 04/29/2020   PLT 224 04/29/2020    Lab Results  Component Value Date   CREATININE 0.67 04/29/2020   BUN 12 04/29/2020   NA 137 04/29/2020   K 3.8 04/29/2020   CL 104 04/29/2020   CO2 24 04/29/2020    Lab Results  Component Value Date   ALT 19 04/29/2020   AST 20 04/29/2020   ALKPHOS 63 04/29/2020   BILITOT 0.6 04/29/2020    Lab Results  Component Value Date   CHOL 153 04/27/2018   HDL 57 04/27/2018   LDLCALC 82 04/27/2018   TRIG 63 04/27/2018   CHOLHDL 2.7 04/27/2018   HIV 1 RNA Quant  Date Value  10/19/2019 <20 Copies/mL  08/04/2018 <20 NOT DETECTED copies/mL   04/27/2018 4,010 copies/mL (H)   HIV-1 RNA Viral Load  Date Value  11/19/2016 <20 copies/mL  05/15/2014 <40  04/09/2014 <40   CD4 (no units)  Date Value  04/09/2014 1,016  02/19/2014 776  12/27/2013 651   CD4 T Cell Abs (/uL)  Date Value  10/19/2019 1,026  04/27/2018 1,030  04/01/2017 800     Assessment & Plan:  Problem List Items Addressed This Visit      Unprioritized   Human immunodeficiency virus (HIV) disease (HCC) (Chronic)    Adherence on Biktarvy seems to be much better. Will update pertinent labs today and screen for STIs with self collected vaginal swab (she deferred pelvic exam or me assisting with sampling).  Return in about 3 months (around 09/20/2020).       Fatty liver    Fatty liver characteristics on CT scan with normal LFTs. Discussed dietary and lifestyle changes that would help to reduce this. Screen for diabetes today.       Abdominal pain, left lower quadrant    ER visits reviewed - CT scan with fatty liver disease, which we discussed today. No obvious explanation for her symptoms. With nausea and upper abdominal discomfort will treat for gastritis omeprazole daily in AM. Counseled to avoid tums d/t drug interactions with biktarvy.  Will trial bentyl with meals and at night if needed. GoodRx coupon given.  We also discussed referral to GI. It appears that this is not necessarily all new for her bur relapsing. The weight loss and bloody vomiting are, however new and concerning.        Other Visit Diagnoses    Left lower quadrant abdominal pain    -  Primary   Relevant Medications   dicyclomine (BENTYL) 10 MG capsule   Other Relevant Orders   Ambulatory referral to Gastroenterology   Routine screening for STI (sexually transmitted infection)       Relevant Orders   Cervicovaginal ancillary only( [place])   RPR   Increased urinary frequency       Relevant Orders   Hemoglobin A1c       Rexene Alberts, MSN, NP-C [facility] Pager: [phone] Office: [phone]  06/20/20  9:52 PM

## 2020-06-20 NOTE — Patient Instructions (Addendum)
For the abdominal pain -   Will start you on a new medication called Bentyl - take this with meals and if needed at bedtime to see if it helps the cramping/sharp pains. Use the coupon I gave you for this.   Nothing on CT scan really explained why you are having that pain but the weightloss and bloody vomit you had is worrisome. Would like to get you seen by GI team here at [facility].  Will give you financial paperwork to consider applying for the [facility] Financial Discount program to cover the bills for you recently.   I will also send in a medication called Omeprazole to take every morning before you eat breakfast to see if that helps potential gastritis symptoms. Stay away from alcohol, Aleve/Asprin/Ibuprofen/Advil for now. This should be covered on ADAP   Please self collect a sample for [place] to test today and go to the lab on your way out.    Please come back to see me in 3 months to check in again.

## 2020-06-20 NOTE — Assessment & Plan Note (Addendum)
ER visits reviewed - CT scan with fatty liver disease, which we discussed today. No obvious explanation for her symptoms. With nausea and upper abdominal discomfort will treat for gastritis omeprazole daily in AM. Counseled to avoid tums d/t drug interactions with biktarvy.  Will trial bentyl with meals and at night if needed. GoodRx coupon given.  We also discussed referral to GI. It appears that this is not necessarily all new for her bur relapsing. The weight loss and bloody vomiting are, however new and concerning.

## 2020-06-21 LAB — HEMOGLOBIN A1C
Hgb A1c MFr Bld: 5.1 % of total Hgb (ref <5.7)
Mean Plasma Glucose: 100 mg/dL
eAG (mmol/L): 5.5 mmol/L

## 2020-06-21 LAB — RPR: RPR Ser Ql: NONREACTIVE

## 2020-06-23 LAB — CERVICOVAGINAL ANCILLARY ONLY
Bacterial Vaginitis (gardnerella): POSITIVE — AB
Chlamydia: NEGATIVE
Comment: NEGATIVE
Comment: NEGATIVE
Comment: NEGATIVE
Comment: NORMAL
Neisseria Gonorrhea: NEGATIVE
Trichomonas: NEGATIVE

## 2020-06-24 MED ORDER — METRONIDAZOLE 500 MG PO TABS: 500.0000 mg | ORAL_TABLET | Freq: Two times a day (BID) | ORAL | 0 refills | Status: DC

## 2020-08-26 NOTE — Telephone Encounter (Signed)
Called [facility] to follow up on patient's message. Per Pharmacy patient does not have insurance coverage besides her medicaid. Patient's BP medication is $11.99 without coupon card. Pharmacy will apply coupon card for patient to get medication for less.  Does not look like patient has renewed ADAP. Will need to set up appointment with Financial Counselor. Will forward message to [facility] to follow up on ADAP renewal.  Not able to reach patient to set up appt.  Juanita Laster, RMA

## 2020-08-27 ENCOUNTER — Ambulatory Visit: Payer: Medicaid Other

## 2020-08-29 ENCOUNTER — Other Ambulatory Visit: Payer: Self-pay

## 2020-08-29 ENCOUNTER — Ambulatory Visit: Payer: Self-pay

## 2020-08-29 ENCOUNTER — Telehealth: Payer: Self-pay

## 2020-08-29 DIAGNOSIS — B20 Human immunodeficiency virus [HIV] disease: Secondary | ICD-10-CM

## 2020-08-29 DIAGNOSIS — Z113 Encounter for screening for infections with a predominantly sexual mode of transmission: Secondary | ICD-10-CM

## 2020-08-29 DIAGNOSIS — Z79899 Other long term (current) drug therapy: Secondary | ICD-10-CM

## 2020-08-29 NOTE — Telephone Encounter (Signed)
Medication Samples have been provided to the patient.  Drug name: Biktarvy        Strength: 50/200/25 mg       Qty: 28 (4 bottles)   LOT: [id number]   Exp.Date: 06/24  Dosing instructions: Take one tablet by mouth once daily  Clearance Coots, CPhT Specialty Pharmacy Patient [facility] Phone: [phone] Fax:  [phone]

## 2020-08-30 ENCOUNTER — Encounter: Payer: Self-pay | Admitting: Infectious Diseases

## 2020-08-30 LAB — T-HELPER CELL (CD4) - (RCID CLINIC ONLY)
CD4 % Helper T Cell: 44 % (ref 33–65)
CD4 T Cell Abs: 903 /uL (ref 400–1790)

## 2020-08-31 ENCOUNTER — Other Ambulatory Visit: Payer: Self-pay

## 2020-08-31 DIAGNOSIS — B9689 Other specified bacterial agents as the cause of diseases classified elsewhere: Secondary | ICD-10-CM

## 2020-09-01 LAB — CBC WITH DIFFERENTIAL/PLATELET
Absolute Monocytes: 481 cells/uL (ref 200–950)
Basophils Absolute: 29 cells/uL (ref 0–200)
Basophils Relative: 0.5 %
Eosinophils Absolute: 110 cells/uL (ref 15–500)
Eosinophils Relative: 1.9 %
HCT: 38 % (ref 35.0–45.0)
Hemoglobin: 12 g/dL (ref 11.7–15.5)
Lymphs Abs: 2262 cells/uL (ref 850–3900)
MCH: 27.1 pg (ref 27.0–33.0)
MCHC: 31.6 g/dL — ABNORMAL LOW (ref 32.0–36.0)
MCV: 85.8 fL (ref 80.0–100.0)
MPV: 10.3 fL (ref 7.5–12.5)
Monocytes Relative: 8.3 %
Neutro Abs: 2917 cells/uL (ref 1500–7800)
Neutrophils Relative %: 50.3 %
Platelets: 252 10*3/uL (ref 140–400)
RBC: 4.43 10*6/uL (ref 3.80–5.10)
RDW: 13.3 % (ref 11.0–15.0)
Total Lymphocyte: 39 %
WBC: 5.8 10*3/uL (ref 3.8–10.8)

## 2020-09-01 LAB — LIPID PANEL
Cholesterol: 196 mg/dL (ref <200)
HDL: 84 mg/dL (ref >=50)
LDL Cholesterol (Calc): 97 mg/dL (calc)
Non-HDL Cholesterol (Calc): 112 mg/dL (calc) (ref <130)
Total CHOL/HDL Ratio: 2.3 (calc) (ref <5.0)
Triglycerides: 60 mg/dL (ref <150)

## 2020-09-01 LAB — COMPREHENSIVE METABOLIC PANEL
AG Ratio: 1.4 (calc) (ref 1.0–2.5)
ALT: 10 U/L (ref 6–29)
AST: 9 U/L — ABNORMAL LOW (ref 10–30)
Albumin: 3.9 g/dL (ref 3.6–5.1)
Alkaline phosphatase (APISO): 68 U/L (ref 31–125)
BUN: 18 mg/dL (ref 7–25)
CO2: 28 mmol/L (ref 20–32)
Calcium: 9.8 mg/dL (ref 8.6–10.2)
Chloride: 105 mmol/L (ref 98–110)
Creat: 0.76 mg/dL (ref 0.50–0.97)
Globulin: 2.8 g/dL (calc) (ref 1.9–3.7)
Glucose, Bld: 85 mg/dL (ref 65–99)
Potassium: 4.5 mmol/L (ref 3.5–5.3)
Sodium: 138 mmol/L (ref 135–146)
Total Bilirubin: 0.5 mg/dL (ref 0.2–1.2)
Total Protein: 6.7 g/dL (ref 6.1–8.1)

## 2020-09-01 LAB — HIV-1 RNA QUANT-NO REFLEX-BLD
HIV 1 RNA Quant: NOT DETECTED Copies/mL
HIV-1 RNA Quant, Log: NOT DETECTED Log cps/mL

## 2020-09-01 LAB — RPR: RPR Ser Ql: NONREACTIVE

## 2020-09-02 ENCOUNTER — Telehealth: Payer: Self-pay

## 2020-09-02 NOTE — Telephone Encounter (Signed)
Attempted to reach patient regarding electronic refill request for Flagyl. Need to know if she is symptomatic or was this an auto request. RX on hold until speaking to patient.  

## 2020-09-02 NOTE — Telephone Encounter (Signed)
Patient called back stating [facility] wanted new RX since she never picked it up in May due to cost. I called pharmacy and spoke to [place] who stated that the Flagyl and the HCTZ are ready for pick up.Pharmacist will inform patient that these are ready to pick up. Refused auto refill request at this time.

## 2020-09-02 NOTE — Telephone Encounter (Signed)
Attempted to reach patient regarding electronic refill request for Flagyl. Need to know if she is symptomatic or was this an auto request. RX on hold until speaking to patient.

## 2020-09-30 ENCOUNTER — Encounter: Payer: Self-pay | Admitting: Infectious Diseases

## 2020-09-30 ENCOUNTER — Other Ambulatory Visit: Payer: Self-pay

## 2020-09-30 ENCOUNTER — Telehealth: Payer: Self-pay

## 2020-09-30 ENCOUNTER — Ambulatory Visit (INDEPENDENT_AMBULATORY_CARE_PROVIDER_SITE_OTHER): Payer: Self-pay | Admitting: Infectious Diseases

## 2020-09-30 VITALS — BP 140/91 | HR 67 | Temp 98.0°F | Wt 225.0 lb

## 2020-09-30 DIAGNOSIS — N949 Unspecified condition associated with female genital organs and menstrual cycle: Secondary | ICD-10-CM | POA: Insufficient documentation

## 2020-09-30 DIAGNOSIS — B20 Human immunodeficiency virus [HIV] disease: Secondary | ICD-10-CM

## 2020-09-30 DIAGNOSIS — N760 Acute vaginitis: Secondary | ICD-10-CM

## 2020-09-30 DIAGNOSIS — B9689 Other specified bacterial agents as the cause of diseases classified elsewhere: Secondary | ICD-10-CM

## 2020-09-30 DIAGNOSIS — N921 Excessive and frequent menstruation with irregular cycle: Secondary | ICD-10-CM | POA: Insufficient documentation

## 2020-09-30 DIAGNOSIS — N939 Abnormal uterine and vaginal bleeding, unspecified: Secondary | ICD-10-CM

## 2020-09-30 DIAGNOSIS — I10 Essential (primary) hypertension: Secondary | ICD-10-CM

## 2020-09-30 MED ORDER — HYDROCHLOROTHIAZIDE 25 MG PO TABS: 25.0000 mg | ORAL_TABLET | Freq: Every day | ORAL | 11 refills | Status: DC

## 2020-09-30 NOTE — Progress Notes (Signed)
Name: Catherine Simmons  DOB: [birth year] MRN: [id number] PCP: Gerlene Fee, DO     Subjective:   CC: HIV follow up care Lower abdominal cramping and period     HPI: LOV May 2022 - was doing better with [place] agt that time. Labs obtained prior to today's visit in July 2022 reveal VL < 20, CD4 903.  She is doing well with her Biktarvy.  She met with financial counselor about 2 to 3 weeks ago and reapplied for her ADAP then but has not gotten the letter and return at this point.  She has only a few pills left of her Biktarvy to make sure she does not miss any doses.  Asking for the number to the lower GI so she can schedule her initial visit with them regarding lower abdominal pain.   Last PAP 12/2019 normal cytology, (-) HPV. Due 12/2022. Still having some cramping/sharp lower abdominal pain at the bottom of her uterus irregardless of periods. Has very heavy bleeding when she does have a cycle.  She would like STI testing today to make sure that she does not have any treatable infections.  No new partners.  Never did pick up the medication for bacterial vaginosis last fall.  Requesting to resume her blood pressure medication since her blood pressures have been a bit high.  Previously struggled with some constipation due to amlodipine.  But recalls that possibly the hydrochlorothiazide was not enough to control it.     Review of Systems  Constitutional:  Negative for appetite change, chills, fatigue, fever and unexpected weight change.  HENT:  Negative for mouth sores, sore throat and trouble swallowing.   Eyes:  Negative for pain and visual disturbance.  Respiratory:  Negative for cough and shortness of breath.   Cardiovascular:  Negative for chest pain.  Gastrointestinal:  Positive for abdominal pain. Negative for diarrhea, nausea and rectal pain.  Genitourinary:  Positive for menstrual problem (heavy, irregular periods, lower abdominal cramping). Negative for dysuria,  pelvic pain and vaginal bleeding.  Musculoskeletal:  Negative for back pain and neck pain.  Skin:  Negative for color change and rash.  Neurological:  Negative for weakness, numbness and headaches.  Hematological:  Negative for adenopathy.  Psychiatric/Behavioral:  Negative for dysphoric mood. The patient is not nervous/anxious.      Past Medical History:  Diagnosis Date   Anemia    Cholecystitis 07/16/10   Genital warts 2004   Hemorrhoids    HIV (human immunodeficiency virus infection) ([place])    Hx of pelvic inflammatory disease 08/31/2013   And hx of multiple STDs    Hypertension    Pregnancy induced hypertension    Sickle cell trait (HCC)     Outpatient Medications Prior to Visit  Medication Sig Dispense Refill   bictegravir-emtricitabine-tenofovir AF (BIKTARVY) 50-200-25 MG TABS tablet Take 1 tablet by mouth daily. 30 tablet 11   dicyclomine (BENTYL) 10 MG capsule Take 1 capsule (10 mg total) by mouth 4 (four) times daily -  before meals and at bedtime. 120 capsule 0   metroNIDAZOLE (FLAGYL) 500 MG tablet Take 1 tablet (500 mg total) by mouth 2 (two) times daily. 14 tablet 0   omeprazole (PRILOSEC) 40 MG capsule Take 1 capsule (40 mg total) by mouth daily. 30 capsule 1   hydrochlorothiazide (HYDRODIURIL) 25 MG tablet Take 1 tablet (25 mg total) by mouth daily. 30 tablet 3   No facility-administered medications prior to visit.     Allergies  Allergen  Reactions   Butorphanol Itching    Tolerates percocet   Stadol [Butorphanol Tartrate] Itching    Tolerates percocet    Social History   Tobacco Use   Smoking status: Some Days    Packs/day: 0.10    Types: Cigarettes    Start date: 03/11/2012    Last attempt to quit: 12/11/2015    Years since quitting: 4.8   Smokeless tobacco: Never   Tobacco comments:    1-2 cigarettes a day   Vaping Use   Vaping Use: Never used  Substance Use Topics   Alcohol use: Yes    Comment: occ   Drug use: Yes    Frequency: 5.0 times per  week    Types: Marijuana    Comment: occ     Social History   Substance and Sexual Activity  Sexual Activity Not Currently   Partners: Male   Birth control/protection: Implant   Comment: given condoms 11/09/19     Objective:   Vitals:   09/30/20 0955  BP: (!) 140/91  Pulse: 67  Temp: 98 F (36.7 C)  TempSrc: Oral  Weight: 225 lb (102.1 kg)   Body mass index is 41.15 kg/m.   Physical Exam HENT:     Mouth/Throat:     Mouth: No oral lesions.     Dentition: No dental abscesses.  Cardiovascular:     Rate and Rhythm: Normal rate and regular rhythm.     Heart sounds: Normal heart sounds.  Pulmonary:     Effort: Pulmonary effort is normal.     Breath sounds: Normal breath sounds.  Abdominal:     General: There is no distension.     Palpations: Abdomen is soft.     Tenderness: There is no abdominal tenderness.  Musculoskeletal:        General: No tenderness. Normal range of motion.  Lymphadenopathy:     Cervical: No cervical adenopathy.  Skin:    General: Skin is warm and dry.     Findings: No rash.  Neurological:     Mental Status: She is alert and oriented to person, place, and time.  Psychiatric:        Judgment: Judgment normal.     Lab Results Lab Results  Component Value Date   WBC 5.8 08/29/2020   HGB 12.0 08/29/2020   HCT 38.0 08/29/2020   MCV 85.8 08/29/2020   PLT 252 08/29/2020    Lab Results  Component Value Date   CREATININE 0.76 08/29/2020   BUN 18 08/29/2020   NA 138 08/29/2020   K 4.5 08/29/2020   CL 105 08/29/2020   CO2 28 08/29/2020    Lab Results  Component Value Date   ALT 10 08/29/2020   AST 9 (L) 08/29/2020   ALKPHOS 63 04/29/2020   BILITOT 0.5 08/29/2020    Lab Results  Component Value Date   CHOL 196 08/29/2020   HDL 84 08/29/2020   LDLCALC 97 08/29/2020   TRIG 60 08/29/2020   CHOLHDL 2.3 08/29/2020   HIV 1 RNA Quant  Date Value  08/29/2020 Not Detected Copies/mL  10/19/2019 <20 Copies/mL  08/04/2018 <20 NOT  DETECTED copies/mL   HIV-1 RNA Viral Load  Date Value  11/19/2016 <20 copies/mL  05/15/2014 <40  04/09/2014 <40   CD4 (no units)  Date Value  04/09/2014 1,016  02/19/2014 776  12/27/2013 651   CD4 T Cell Abs (/uL)  Date Value  08/29/2020 903  10/19/2019 1,026  04/27/2018 1,030  Assessment & Plan:   Problem List Items Addressed This Visit       Unprioritized   Human immunodeficiency virus (HIV) disease ([place]) - Primary (Chronic)    Brihany is doing very well on her [place].  We reviewed her most recent blood work and answered questions to her satisfaction.  Overall very well controlled with viral load completely undetectable and fully recovered immune system with CD4 above 900.  We will make sure that her ADAP has been approved.  Continue Biktarvy once daily. STI screening today.  Her Pap smear is up-to-date. Return in about 3 months (around 12/31/2020).       Vaginal discomfort    I suspect recurrent BV.  Will obtain self collected vaginal swab to check for gonorrhea, chlamydia, trichomonas as well as BV and Candida.  Treatment accordingly based on results.      Relevant Orders   Urine cytology ancillary only   Cervicovaginal ancillary only( [place])   Menorrhagia with irregular cycle    We talked again about a referral to gynecology.  I do suspect she is got some uterine fibroids that need management to help with current symptoms.       Relevant Orders   Ambulatory referral to Gynecology   Chronic hypertension    She had side effects to amlodipine previously with constipation.  We will start hydrochlorothiazide 25 mg daily in the morning.  Follow-up with our pharmacy team in 3 months to see how she is doing with her new medication.  We will need a BMP at that time.      Relevant Medications   hydrochlorothiazide (HYDRODIURIL) 25 MG tablet   RESOLVED: Abnormal uterine bleeding    Janene Madeira, MSN, NP-C [facility] [place] Pager: [phone] Office: [phone]  09/30/20  10:41 AM

## 2020-09-30 NOTE — Assessment & Plan Note (Signed)
I suspect recurrent BV.  Will obtain self collected vaginal swab to check for gonorrhea, chlamydia, trichomonas as well as BV and Candida.  Treatment accordingly based on results.

## 2020-09-30 NOTE — Assessment & Plan Note (Signed)
Catherine Simmons is doing very well on her Biktarvy.  We reviewed her most recent blood work and answered questions to her satisfaction.  Overall very well controlled with viral load completely undetectable and fully recovered immune system with CD4 above 900.  We will make sure that her ADAP has been approved.  Continue Biktarvy once daily. STI screening today.  Her Pap smear is up-to-date. Return in about 3 months (around 12/31/2020).

## 2020-09-30 NOTE — Assessment & Plan Note (Signed)
She had side effects to amlodipine previously with constipation.  We will start hydrochlorothiazide 25 mg daily in the morning.  Follow-up with our pharmacy team in 3 months to see how she is doing with her new medication.  We will need a BMP at that time.

## 2020-09-30 NOTE — Patient Instructions (Addendum)
Please continue your Biktarvy every   START your blood pressure medication back Hydrochlorothiazide 25 mg tablet one a day in the morning. Will check your blood pressure again at your next appointment in 3 months.   Please give [place] a urine sample and a vaginal swab today.   Will help to arrange a referral to gynecology to help with your bleeding.   [facility] - call them for an appointment your referral should be good still.  [address] [place], [place], [place] [address] Phone: [phone]

## 2020-09-30 NOTE — Assessment & Plan Note (Deleted)
We talked again about a referral to gynecology.  I do suspect she is got some uterine fibroids that need management to help with current symptoms.  

## 2020-09-30 NOTE — Telephone Encounter (Signed)
Patient stated she forgot to mention at her appointment that she has been experiencing nausea with taking her Biktarvy. Patient is requesting something for the nausea. Please advise

## 2020-09-30 NOTE — Assessment & Plan Note (Signed)
We talked again about a referral to gynecology.  I do suspect she is got some uterine fibroids that need management to help with current symptoms.

## 2020-10-01 LAB — URINE CYTOLOGY ANCILLARY ONLY
Chlamydia: NEGATIVE
Comment: NEGATIVE
Comment: NEGATIVE
Comment: NORMAL
Neisseria Gonorrhea: NEGATIVE
Trichomonas: POSITIVE — AB

## 2020-10-01 MED ORDER — METRONIDAZOLE 500 MG PO TABS: 500.0000 mg | ORAL_TABLET | Freq: Two times a day (BID) | ORAL | 0 refills | Status: DC

## 2020-10-01 MED ORDER — ONDANSETRON 4 MG PO TBDP: 4.0000 mg | ORAL_TABLET | Freq: Three times a day (TID) | ORAL | 2 refills | Status: DC | PRN

## 2020-10-01 NOTE — Telephone Encounter (Signed)
I will send her in some dissolvable zofran tablets to use for nausea - she can use this 30 min prior to her biktarvy dose or just as needed in response to nausea if she does not get it everyday.   I would encourage her to take the biktarvy 30 minutes after a meal to see if that helps relieve the nausea symptoms also.

## 2020-10-01 NOTE — Addendum Note (Signed)
Addended by: Blanchard Kelch on: 10/01/2020 09:32 AM   Modules accepted: Orders

## 2020-10-01 NOTE — Telephone Encounter (Signed)
Mychart message sent to the patient with instruction per patient request

## 2020-10-01 NOTE — Addendum Note (Signed)
Addended by: Blanchard Kelch on: 10/01/2020 03:17 PM   Modules accepted: Orders

## 2020-10-02 ENCOUNTER — Telehealth: Payer: Self-pay | Admitting: Infectious Diseases

## 2020-10-02 LAB — CERVICOVAGINAL ANCILLARY ONLY
Bacterial Vaginitis (gardnerella): POSITIVE — AB
Candida Glabrata: NEGATIVE
Candida Vaginitis: NEGATIVE
Chlamydia: NEGATIVE
Comment: NEGATIVE
Comment: NEGATIVE
Comment: NEGATIVE
Comment: NEGATIVE
Comment: NEGATIVE
Comment: NORMAL
Neisseria Gonorrhea: NEGATIVE
Trichomonas: NEGATIVE

## 2020-10-02 NOTE — Telephone Encounter (Signed)
I called to speak with Catherine Simmons about her lab results. Urine + trichomonas and BV + on cervical swab. We talked about results and treatment. Her current partner is at the HD now getting treated as well and admitted to other partners outside their current relationship.   Provided support and reassurance for her over the phone.

## 2020-11-21 ENCOUNTER — Other Ambulatory Visit: Payer: Self-pay | Admitting: Infectious Diseases

## 2020-12-20 ENCOUNTER — Encounter: Payer: Self-pay | Admitting: Family

## 2020-12-20 ENCOUNTER — Other Ambulatory Visit (HOSPITAL_COMMUNITY)
Admission: RE | Admit: 2020-12-20 | Discharge: 2020-12-20 | Disposition: A | Discharge status: Home / Self Care | Payer: Medicaid Other | Source: Ambulatory Visit | Attending: Family | Admitting: Family

## 2020-12-20 ENCOUNTER — Ambulatory Visit (INDEPENDENT_AMBULATORY_CARE_PROVIDER_SITE_OTHER): Payer: Self-pay | Admitting: Family

## 2020-12-20 ENCOUNTER — Other Ambulatory Visit: Payer: Self-pay

## 2020-12-20 VITALS — BP 141/84 | HR 64 | Temp 97.8°F | Wt 228.8 lb

## 2020-12-20 DIAGNOSIS — Z113 Encounter for screening for infections with a predominantly sexual mode of transmission: Secondary | ICD-10-CM | POA: Diagnosis present

## 2020-12-20 DIAGNOSIS — O98719 Human immunodeficiency virus [HIV] disease complicating pregnancy, unspecified trimester: Secondary | ICD-10-CM

## 2020-12-20 LAB — POCT URINE PREGNANCY: Preg Test, Ur: POSITIVE — AB

## 2020-12-20 MED ORDER — TRIUMEQ 600-50-300 MG PO TABS: 1.0000 | ORAL_TABLET | Freq: Every day | ORAL | 0 refills | Status: DC

## 2020-12-20 NOTE — Progress Notes (Signed)
Patient ID: Catherine Simmons, female    DOB: 10-23-1985, 35 y.o.   MRN: [id number]  Subjective:    Chief Complaint  Patient presents with   Follow-up    Patient reports having missed menstrual cycle x 2 months. Pt would like a pregnancy test.     HPI:  Catherine Simmons is a 35 y.o. female with HIV disease last seen on 09/30/20 with well controlled virus and good adherence and tolerance to her ART regimen of Biktarvy. Viral load was undetectable and CD4 count was 903. Here today for an acute office visit.  Catherine Simmons has concern about possible pregnancy as she has not had a menstrual cycle in approximately 2 months and was last sexually active without protection in September. Continues to have baseline nausea with no other symptoms. Has been off Biktarvy for at least the last month because she has not been able to pick it up at the pharmacy. Denies fevers, chills, night sweats, headaches, changes in vision, neck pain/stiffness diarrhea, vomiting, lesions or rashes.    Allergies  Allergen Reactions   Butorphanol Itching    Tolerates percocet   Stadol [Butorphanol Tartrate] Itching    Tolerates percocet      Outpatient Medications Prior to Visit  Medication Sig Dispense Refill   dicyclomine (BENTYL) 10 MG capsule Take 1 capsule (10 mg total) by mouth 4 (four) times daily -  before meals and at bedtime. (Patient not taking: Reported on 12/20/2020) 120 capsule 0   hydrochlorothiazide (HYDRODIURIL) 25 MG tablet Take 1 tablet (25 mg total) by mouth daily. (Patient not taking: Reported on 12/20/2020) 30 tablet 11   metroNIDAZOLE (FLAGYL) 500 MG tablet Take 1 tablet (500 mg total) by mouth 2 (two) times daily. (Patient not taking: Reported on 12/20/2020) 14 tablet 0   omeprazole (PRILOSEC) 40 MG capsule Take 1 capsule (40 mg total) by mouth daily. (Patient not taking: Reported on 12/20/2020) 30 capsule 1   ondansetron (ZOFRAN ODT) 4 MG disintegrating tablet Take 1 tablet (4 mg total)  by mouth every 8 (eight) hours as needed for nausea or vomiting. (Patient not taking: Reported on 12/20/2020) 30 tablet 2   BIKTARVY 50-200-25 MG TABS tablet TAKE 1 TABLET BY MOUTH DAILY (Patient not taking: Reported on 12/20/2020) 30 tablet 3   No facility-administered medications prior to visit.     Past Medical History:  Diagnosis Date   Anemia    Cholecystitis 07/16/10   Genital warts 2004   Hemorrhoids    HIV (human immunodeficiency virus infection) (HCC)    Hx of pelvic inflammatory disease 08/31/2013   And hx of multiple STDs    Hypertension    Pregnancy induced hypertension    Sickle cell trait [facility])      Past Surgical History:  Procedure Laterality Date   CHOLECYSTECTOMY     CHOLECYSTECTOMY  07/19/2010   EXTERNAL CEPHALIC VERSION  12/11/2016       WISDOM TOOTH EXTRACTION        Review of Systems  Constitutional:  Negative for appetite change, chills, diaphoresis, fatigue, fever and unexpected weight change.  Eyes:        Negative for acute change in vision  Respiratory:  Negative for chest tightness, shortness of breath and wheezing.   Cardiovascular:  Negative for chest pain.  Gastrointestinal:  Negative for diarrhea, nausea and vomiting.  Genitourinary:  Negative for dysuria, pelvic pain and vaginal discharge.  Musculoskeletal:  Negative for neck pain and neck stiffness.  Skin:  Negative for rash.  Neurological:  Negative for seizures, syncope, weakness and headaches.  Hematological:  Negative for adenopathy. Does not bruise/bleed easily.  Psychiatric/Behavioral:  Negative for hallucinations.      Objective:    BP (!) 141/84   Pulse 64   Temp 97.8 F (36.6 C) (Temporal)   Wt 228 lb 12.8 oz (103.8 kg)   SpO2 100%   BMI 41.85 kg/m  Nursing note and vital signs reviewed.  Physical Exam Constitutional:      General: She is not in acute distress.    Appearance: She is well-developed.  Eyes:     Conjunctiva/sclera: Conjunctivae normal.   Cardiovascular:     Rate and Rhythm: Normal rate and regular rhythm.     Heart sounds: Normal heart sounds. No murmur heard.   No friction rub. No gallop.  Pulmonary:     Effort: Pulmonary effort is normal. No respiratory distress.     Breath sounds: Normal breath sounds. No wheezing or rales.  Chest:     Chest wall: No tenderness.  Abdominal:     General: Bowel sounds are normal.     Palpations: Abdomen is soft.     Tenderness: There is no abdominal tenderness.  Musculoskeletal:     Cervical back: Neck supple.  Lymphadenopathy:     Cervical: No cervical adenopathy.  Skin:    General: Skin is warm and dry.     Findings: No rash.  Neurological:     Mental Status: She is alert and oriented to person, place, and time.  Psychiatric:        Behavior: Behavior normal.        Thought Content: Thought content normal.        Judgment: Judgment normal.     Depression screen [facility] 2/9 12/20/2020 12/26/2019 11/09/2019 08/04/2018 11/27/2016  Decreased Interest 3 0 0 0 2  Down, Depressed, Hopeless 3 0 0 0 1  PHQ - 2 Score 6 0 0 0 3  Altered sleeping - - - - 3  Tired, decreased energy - - - - 2  Change in appetite - - - - 2  Feeling bad or failure about yourself  - - - - 2  Trouble concentrating - - - - 0  Moving slowly or fidgety/restless - - - - 1  Suicidal thoughts - - - - 0  PHQ-9 Score - - - - 13  Some recent data might be hidden       Assessment & Plan:    Patient Active Problem List   Diagnosis Date Noted   Vaginal discomfort 09/30/2020   Menorrhagia with irregular cycle 09/30/2020   Abdominal pain, left lower quadrant 06/20/2020   Fatty liver 06/20/2020   Screening for cervical cancer 12/26/2019   Amenorrhea 11/09/2019   Iron deficiency anemia 05/13/2018   Fatigue 01/03/2018   HIV affecting pregnancy, antepartum 08/05/2016   Sickle cell trait (HCC) 07/22/2016   BMI 40.0-44.9, adult (HCC) 07/22/2016   Hidradenitis suppurativa 05/13/2016   Marijuana use 12/26/2014    Chronic hypertension 03/19/2014   Anxiety 06/30/2013   Human immunodeficiency virus (HIV) disease (HCC) 10/12/2012   CARPAL TUNNEL SYNDROME 11/14/2009   Adjustment disorder with depressed mood 05/09/2008     Problem List Items Addressed This Visit       Other   HIV affecting pregnancy, antepartum - Primary    Ms. Broyles has been off medication for the past month secondary to issues with her financial assistance. No signs/symptoms of opportunistic  infection. Will check lab work today. Pregnancy test is positive and was able to obtain Triumeq for 1 month which she will start in addition to a prenatal vitamin. She will follow up with OB. Plan for follow up with Judeth Cornfield in 1 month or sooner if needed.        Relevant Medications   abacavir-dolutegravir-lamiVUDine (TRIUMEQ) 600-50-300 MG tablet   Other Relevant Orders   POCT urine pregnancy   hCG, serum, qualitative   T-helper cells (CD4) count   HIV-1 RNA ultraquant reflex to gentyp+   Other Visit Diagnoses     Screening for STDs (sexually transmitted diseases)       Relevant Orders   RPR   Urine cytology ancillary only([place])        I have discontinued Aretta L. Mungo's Biktarvy. I am also having her start on Triumeq. Additionally, I am having her maintain her dicyclomine, omeprazole, hydrochlorothiazide, ondansetron, and metroNIDAZOLE.   Follow-up: Return in about 1 month (around 01/19/2021).   Marcos Eke, MSN, FNP-C Nurse Practitioner [facility] Main number: [phone]

## 2020-12-20 NOTE — Assessment & Plan Note (Deleted)
Ms. Catherine Simmons has been off medication for the past month secondary to issues with her financial assistance. No signs/symptoms of opportunistic infection. Will check lab work today. Pregnancy test is positive and was able to obtain Triumeq for 1 month which she will start in addition to a prenatal vitamin. She will follow up with OB. Plan for follow up with Stephanie in 1 month or sooner if needed.   

## 2020-12-20 NOTE — Assessment & Plan Note (Signed)
Catherine Simmons has been off medication for the past month secondary to issues with her financial assistance. No signs/symptoms of opportunistic infection. Will check lab work today. Pregnancy test is positive and was able to obtain Triumeq for 1 month which she will start in addition to a prenatal vitamin. She will follow up with OB. Plan for follow up with Judeth Cornfield in 1 month or sooner if needed.

## 2020-12-20 NOTE — Patient Instructions (Addendum)
Nice to see you.  We will check your lab work today.   Please start taking a pre-natal vitamin.  Start taking Triumeq.  Make a follow up appointment with your Gynecologist.  Follow up with Judeth Cornfield in 1 month.

## 2020-12-23 LAB — URINE CYTOLOGY ANCILLARY ONLY
Chlamydia: NEGATIVE
Comment: NEGATIVE
Comment: NORMAL
Neisseria Gonorrhea: NEGATIVE

## 2020-12-24 LAB — RPR: RPR Ser Ql: NONREACTIVE

## 2020-12-24 LAB — HIV-1 RNA ULTRAQUANT REFLEX TO GENTYP+
HIV 1 RNA Quant: NOT DETECTED copies/mL
HIV-1 RNA Quant, Log: NOT DETECTED Log copies/mL

## 2020-12-24 LAB — T-HELPER CELLS (CD4) COUNT (NOT AT ARMC)
Absolute CD4: 1053 cells/uL (ref 490–1740)
CD4 T Helper %: 45 % (ref 30–61)
Total lymphocyte count: 2366 cells/uL (ref 850–3900)

## 2020-12-30 ENCOUNTER — Ambulatory Visit: Payer: Medicaid Other | Admitting: Pharmacist

## 2020-12-31 ENCOUNTER — Other Ambulatory Visit: Payer: Self-pay

## 2020-12-31 ENCOUNTER — Inpatient Hospital Stay (HOSPITAL_COMMUNITY): Payer: Medicaid Other

## 2020-12-31 ENCOUNTER — Inpatient Hospital Stay (HOSPITAL_COMMUNITY)
Admission: AD | Admit: 2020-12-31 | Discharge: 2020-12-31 | Disposition: A | Discharge status: Home / Self Care | Payer: Medicaid Other | Attending: Obstetrics & Gynecology | Admitting: Obstetrics & Gynecology

## 2020-12-31 ENCOUNTER — Encounter (HOSPITAL_COMMUNITY): Payer: Self-pay | Admitting: Obstetrics & Gynecology

## 2020-12-31 DIAGNOSIS — M545 Low back pain, unspecified: Secondary | ICD-10-CM | POA: Diagnosis not present

## 2020-12-31 DIAGNOSIS — O99011 Anemia complicating pregnancy, first trimester: Secondary | ICD-10-CM | POA: Insufficient documentation

## 2020-12-31 DIAGNOSIS — R42 Dizziness and giddiness: Secondary | ICD-10-CM | POA: Insufficient documentation

## 2020-12-31 DIAGNOSIS — B20 Human immunodeficiency virus [HIV] disease: Secondary | ICD-10-CM

## 2020-12-31 DIAGNOSIS — T461X6A Underdosing of calcium-channel blockers, initial encounter: Secondary | ICD-10-CM | POA: Diagnosis not present

## 2020-12-31 DIAGNOSIS — O10011 Pre-existing essential hypertension complicating pregnancy, first trimester: Secondary | ICD-10-CM

## 2020-12-31 DIAGNOSIS — R519 Headache, unspecified: Secondary | ICD-10-CM | POA: Insufficient documentation

## 2020-12-31 DIAGNOSIS — T448X6A Underdosing of centrally-acting and adrenergic-neuron-blocking agents, initial encounter: Secondary | ICD-10-CM | POA: Insufficient documentation

## 2020-12-31 DIAGNOSIS — Z91120 Patient's intentional underdosing of medication regimen due to financial hardship: Secondary | ICD-10-CM | POA: Insufficient documentation

## 2020-12-31 DIAGNOSIS — O09521 Supervision of elderly multigravida, first trimester: Secondary | ICD-10-CM | POA: Diagnosis present

## 2020-12-31 DIAGNOSIS — R102 Pelvic and perineal pain: Secondary | ICD-10-CM | POA: Diagnosis not present

## 2020-12-31 DIAGNOSIS — R1030 Lower abdominal pain, unspecified: Secondary | ICD-10-CM | POA: Diagnosis not present

## 2020-12-31 DIAGNOSIS — O219 Vomiting of pregnancy, unspecified: Secondary | ICD-10-CM | POA: Insufficient documentation

## 2020-12-31 DIAGNOSIS — Z3A08 8 weeks gestation of pregnancy: Secondary | ICD-10-CM | POA: Insufficient documentation

## 2020-12-31 DIAGNOSIS — H538 Other visual disturbances: Secondary | ICD-10-CM | POA: Insufficient documentation

## 2020-12-31 DIAGNOSIS — Z21 Asymptomatic human immunodeficiency virus [HIV] infection status: Secondary | ICD-10-CM | POA: Insufficient documentation

## 2020-12-31 DIAGNOSIS — D509 Iron deficiency anemia, unspecified: Secondary | ICD-10-CM | POA: Insufficient documentation

## 2020-12-31 DIAGNOSIS — O26891 Other specified pregnancy related conditions, first trimester: Secondary | ICD-10-CM | POA: Insufficient documentation

## 2020-12-31 DIAGNOSIS — O99891 Other specified diseases and conditions complicating pregnancy: Secondary | ICD-10-CM | POA: Diagnosis not present

## 2020-12-31 DIAGNOSIS — O10911 Unspecified pre-existing hypertension complicating pregnancy, first trimester: Secondary | ICD-10-CM | POA: Diagnosis not present

## 2020-12-31 DIAGNOSIS — Z9049 Acquired absence of other specified parts of digestive tract: Secondary | ICD-10-CM | POA: Diagnosis not present

## 2020-12-31 DIAGNOSIS — O98711 Human immunodeficiency virus [HIV] disease complicating pregnancy, first trimester: Secondary | ICD-10-CM | POA: Insufficient documentation

## 2020-12-31 DIAGNOSIS — I10 Essential (primary) hypertension: Secondary | ICD-10-CM

## 2020-12-31 HISTORY — DX: Urinary tract infection, site not specified: N39.0

## 2020-12-31 HISTORY — DX: Headache, unspecified: R51.9

## 2020-12-31 HISTORY — DX: Unspecified ovarian cyst, unspecified side: N83.209

## 2020-12-31 HISTORY — DX: Depression, unspecified: F32.A

## 2020-12-31 LAB — URINALYSIS, ROUTINE W REFLEX MICROSCOPIC
Bilirubin Urine: NEGATIVE
Glucose, UA: NEGATIVE mg/dL
Hgb urine dipstick: NEGATIVE
Ketones, ur: NEGATIVE mg/dL
Leukocytes,Ua: NEGATIVE
Nitrite: NEGATIVE
Protein, ur: NEGATIVE mg/dL
Specific Gravity, Urine: 1.01 (ref 1.005–1.030)
pH: 6 (ref 5.0–8.0)

## 2020-12-31 LAB — CBC
HCT: 32.8 % — ABNORMAL LOW (ref 36.0–46.0)
Hemoglobin: 11.1 g/dL — ABNORMAL LOW (ref 12.0–15.0)
MCH: 27.6 pg (ref 26.0–34.0)
MCHC: 33.8 g/dL (ref 30.0–36.0)
MCV: 81.6 fL (ref 80.0–100.0)
Platelets: 226 10*3/uL (ref 150–400)
RBC: 4.02 MIL/uL (ref 3.87–5.11)
RDW: 11.9 % (ref 11.5–15.5)
WBC: 6.3 10*3/uL (ref 4.0–10.5)
nRBC: 0 % (ref 0.0–0.2)

## 2020-12-31 LAB — HCG, QUANTITATIVE, PREGNANCY: hCG, Beta Chain, Quant, S: 92067 m[IU]/mL — ABNORMAL HIGH (ref <5)

## 2020-12-31 MED ORDER — METOCLOPRAMIDE HCL 10 MG PO TABS
10.0000 mg | ORAL_TABLET | Freq: Once | ORAL | Status: AC
  Administered 2020-12-31: 10 mg via ORAL
  Filled 2020-12-31: qty 1

## 2020-12-31 MED ORDER — CYCLOBENZAPRINE HCL 5 MG PO TABS: 5.0000 mg | ORAL_TABLET | Freq: Three times a day (TID) | ORAL | 0 refills | Status: DC | PRN

## 2020-12-31 MED ORDER — METOCLOPRAMIDE HCL 10 MG PO TABS: 10.0000 mg | ORAL_TABLET | Freq: Three times a day (TID) | ORAL | 0 refills | Status: DC | PRN

## 2020-12-31 MED ORDER — ACETAMINOPHEN 500 MG PO TABS
1000.0000 mg | ORAL_TABLET | Freq: Once | ORAL | Status: AC
  Administered 2020-12-31: 1000 mg via ORAL
  Filled 2020-12-31: qty 2

## 2020-12-31 MED ORDER — LABETALOL HCL 200 MG PO TABS: 200.0000 mg | ORAL_TABLET | Freq: Two times a day (BID) | ORAL | 0 refills | Status: DC

## 2020-12-31 NOTE — Discharge Instructions (Signed)
For prevention of migraines in pregnancy: -Magnesium, 400mg by mouth, once daily -Vitamin B2, 400mg by mouth, once daily  For treatment of migraines in pregnancy: -take medication at the first sign of the pain of a headache, or the first sign of your aura -start with 1000mg Tylenol (do not exceed 4000mg of Tylenol in 24hrs), with or without Reglan 10mg -if no relief after 1-2hours, can take Flexeril 10mg     

## 2020-12-31 NOTE — MAU Note (Signed)
Pt states HIV is controlled, currently undetectable.

## 2020-12-31 NOTE — MAU Note (Addendum)
Had had a headache for 2, not as bad now- took some Tylenol.  Has been feeling dizzy, hx of anemia, not currently on iron.  Having pain in rt flank around to the front, started about 2 days ago.

## 2020-12-31 NOTE — MAU Provider Note (Addendum)
History     CSN: [id number]  Arrival date and time: 12/31/20 [id number]   Event Date/Time   First Provider Initiated Contact with Patient 12/31/20 [id number]      Chief Complaint  Patient presents with   Headache   Dizziness   Flank Pain    Catherine Simmons is a 35 y/o [id number] female estimated to be [redacted]w[redacted]d dated via irregular menstrual cycle dates. Positive pregnancy test within the last 2 weeks. Fundal height not palpable and fetal heart tones not detectable. Hx of HIV at undetectable ranges, iron deficiency anemia not currently treated, and chronic HTN. Pt reporting headache, dizziness, and right low back and groin pain worsening over the past 2 days.   Pt reports constant 6/10 headache, with minimal decrease in intensity with Tylenol, but unrelieved and worsening over the past 2 days. Reports band-like pain in bilateral frontal lobes. Pt also reporting dizziness, feeling unsteady on her feet and that the room is spinning. Pt states she has had several pre-syncopal episodes without loss of consciousness. Pt also reports blurred vision and seeing spots. Pt reports she has chronic HTN and was previously managed on either labetalol or amlodipine, but discontinued the medication prior to this pregnancy because she couldn't afford it. Pt takes her BP daily at home and states it is usually around 135/80. Pt unsure but believes she may have had pre-eclampsia in a previous pregnancy. Pt has been nauseous and vomiting, but states she has been having adequate food and fluid intake.   Pt also presenting with right lower back pain radiating to right groin. States pain is constant, but has intermittent periods of worsening pain. Pt reports no urinary symptoms, has never had kidney stones. Reports history of cholelithiasis with subsequent cholecystectomy. Pt is not concerned for any STIs and states she has not been sexually active since her last negative STI testing.     OB History     Gravida  7   Para  5    Term  4   Preterm  1   AB  1   Living  5      SAB      IAB  1   Ectopic      Multiple  0   Live Births  5           Past Medical History:  Diagnosis Date   Anemia    Cholecystitis 07/16/2010   Depression    Genital warts 2004   Headache    Hemorrhoids    HIV (human immunodeficiency virus infection) ([place])    Hx of pelvic inflammatory disease 08/31/2013   And hx of multiple STDs    Hypertension    Ovarian cyst    Pregnancy induced hypertension    Sickle cell trait (HCC)    UTI (urinary tract infection)     Past Surgical History:  Procedure Laterality Date   CHOLECYSTECTOMY  07/19/2010   EXTERNAL CEPHALIC VERSION  [id number]       WISDOM TOOTH EXTRACTION      Family History  Problem Relation Age of Onset   Cancer Mother    Hypertension Mother    Diabetes Mother    Pancreatitis Mother    Hypertension Father    Diabetes Maternal Aunt     Social History   Tobacco Use   Smoking status: Some Days    Years: 15.00    Types: Cigarettes    Start date: 03/11/2012   Smokeless tobacco: Never   Tobacco comments:  1-2 cigarettes a day   Vaping Use   Vaping Use: Some days  Substance Use Topics   Alcohol use: Not Currently    Comment: occ   Drug use: Not Currently    Frequency: 5.0 times per week    Types: Marijuana    Comment: occ    Allergies:  Allergies  Allergen Reactions   Butorphanol Itching    Tolerates percocet   Stadol [Butorphanol Tartrate] Itching    Tolerates percocet    No medications prior to admission.    Review of Systems  Constitutional:  Negative for appetite change, chills, diaphoresis, fatigue, fever and unexpected weight change.  Respiratory:  Negative for chest tightness and shortness of breath.   Cardiovascular:  Negative for chest pain and palpitations.  Gastrointestinal:  Positive for nausea and vomiting. Negative for constipation and diarrhea.  Genitourinary:  Positive for flank pain and pelvic pain. Negative  for difficulty urinating, dysuria, frequency, hematuria and urgency.  Musculoskeletal:  Positive for back pain. Negative for gait problem.  Neurological:  Positive for dizziness, light-headedness and headaches. Negative for syncope, speech difficulty, weakness and numbness.  Physical Exam   Blood pressure 136/83, pulse 67, temperature 97.8 F (36.6 C), temperature source Oral, resp. rate 16, height 5\' 2"  (1.575 m), weight 103.8 kg, last menstrual period 09/09/2020, SpO2 99 %, unknown if currently breastfeeding.  Physical Exam Vitals reviewed.  Constitutional:      Appearance: She is well-developed and normal weight.  Cardiovascular:     Rate and Rhythm: Normal rate and regular rhythm.     Heart sounds: Normal heart sounds.  Pulmonary:     Effort: Pulmonary effort is normal.     Breath sounds: Normal breath sounds.  Abdominal:     General: There is no distension.     Palpations: Abdomen is soft. There is no mass.     Tenderness: There is abdominal tenderness in the right lower quadrant and suprapubic area. There is no right CVA tenderness or guarding.  Musculoskeletal:        General: No swelling.     Lumbar back: Tenderness present.       Back:  Skin:    General: Skin is warm and dry.     Capillary Refill: Capillary refill takes less than 2 seconds.  Neurological:     Mental Status: She is alert.  Psychiatric:        Mood and Affect: Mood normal.        Speech: Speech normal.        Behavior: Behavior normal.    MAU Course  Procedures Results for orders placed or performed during the hospital encounter of 12/31/20 (from the past 24 hour(s))  Urinalysis, Routine w reflex microscopic Urine, Clean Catch     Status: Abnormal   Collection Time: 12/31/20  9:42 AM  Result Value Ref Range   Color, Urine STRAW (A) YELLOW   APPearance CLEAR CLEAR   Specific Gravity, Urine 1.010 1.005 - 1.030   pH 6.0 5.0 - 8.0   Glucose, UA NEGATIVE NEGATIVE mg/dL   Hgb urine dipstick NEGATIVE  NEGATIVE   Bilirubin Urine NEGATIVE NEGATIVE   Ketones, ur NEGATIVE NEGATIVE mg/dL   Protein, ur NEGATIVE NEGATIVE mg/dL   Nitrite NEGATIVE NEGATIVE   Leukocytes,Ua NEGATIVE NEGATIVE  CBC     Status: Abnormal   Collection Time: 12/31/20  9:55 AM  Result Value Ref Range   WBC 6.3 4.0 - 10.5 K/uL   RBC 4.02 3.87 - 5.11  MIL/uL   Hemoglobin 11.1 (L) 12.0 - 15.0 g/dL   HCT 32.8 (L) 36.0 - 46.0 %   MCV 81.6 80.0 - 100.0 fL   MCH 27.6 26.0 - 34.0 pg   MCHC 33.8 30.0 - 36.0 g/dL   RDW 11.9 11.5 - 15.5 %   Platelets 226 150 - 400 K/uL   nRBC 0.0 0.0 - 0.2 %  hCG, quantitative, pregnancy     Status: Abnormal   Collection Time: 12/31/20  9:55 AM  Result Value Ref Range   hCG, Beta Chain, Quant, S 92,067 (H) <5 mIU/mL    US OB Comp Less 14 Wks  Result Date: 12/31/2020 CLINICAL DATA:  Abdominal pain in first trimester of pregnancy, O26.891; LMP 09/09/2020, no current quantitative beta HCG yet available for correlation EXAM: OBSTETRIC <14 WK ULTRASOUND TECHNIQUE: Transabdominal ultrasound was performed for evaluation of the gestation as well as the maternal uterus and adnexal regions. COMPARISON:  None for this gestation FINDINGS: Intrauterine gestational sac: Present, single Yolk sac:  Present Embryo:  Present Cardiac Activity: Present Heart Rate: 166 bpm CRL:   17.6 mm   8 w 1 d                  [place] EDC: 08/11/2021 Subchorionic hemorrhage:  None visualized. Maternal uterus/adnexae: Remainder of uterus unremarkable. LEFT ovary normal size and morphology, 2.0 x 2.2 x 1.4 cm. RIGHT ovary measures 4.2 x 2.7 x 3.2 cm and contains a small corpus luteal cyst. No free pelvic fluid or adnexal masses. IMPRESSION: Single live intrauterine gestation at 8 weeks 1 day EGA by crown-rump length. No acute abnormalities. Electronically Signed   By: Lavonia Dana M.D.   On: 12/31/2020 11:05     MDM OB US ordered to evaluate for IUP vs. Ectopic. US showed IUP with GA [redacted]w[redacted]d and right ovarian corpus luteal cyst. UA ordered  and negative. CBC to evaluate anemia status showed baseline Hgb 11.1.   Assessment and Plan  [redacted] weeks gestation of pregnancy - US showed IUP with GA [redacted]w[redacted]d - Quantitative hCG WNL for this stage in pregnancy  Chronic HTN - Restarted labetalol 200mg  BID  3. Headache, Nausea, Vomiting - Headache improved during stay, now 2/10 pain level - Metoclopramide 10mg  PRN ordered  - Continue acetaminophen PRN for headache  Back and Groin Pain - Cyclobenzaprine 5mg  PRN ordered for back pain   Clent Ridges 12/31/2020, 9:52 AM     Attestation of Supervision of Student:  I confirm that I have verified the information documented in the physician assistant student's note and that I have also personally performed the history, physical exam and all medical decision making activities.  I have verified that all services and findings are accurately documented in this student's note; and I agree with management and plan as outlined in the documentation. I have also made any necessary editorial changes.  History Catherine Simmons is a 35 y.o. [id number] at [redacted]w[redacted]d by unsure LMP who presents with back pain and headache.  Reports right lower back pain that radiates to her right groin. Endorses occasional nausea. Denies fever, dysuria, vaginal bleeding or vaginal discharge.  Headache x 2 days. Frontal headache that she rates 6/10. Has been taking tylenol with no relief. Last dose of tylenol was last night. Has felt dizzy & lightheaded at times with her headache.  Sees ID for HIV - reports levels are now undetectable.  Hx of chronic hypertension. She stopped taking her antihypertensive prior to pregnancy due to insurance  issues. Has appointment with OB next month.   Physical exam BP 136/83   Pulse 67   Temp 97.8 F (36.6 C) (Oral)   Resp 16   Ht 5\' 2"  (1.575 m)   Wt 103.8 kg   LMP 09/09/2020 Comment: had 2 days of bleeding in Sept  SpO2 99%   BMI 41.85 kg/m   Physical Examination: General appearance -  alert, well appearing, and in no distress Mental status - normal mood, behavior, speech, dress, motor activity, and thought processes Eyes - sclera anicteric Chest - normal respiratory effort Abdomen - soft, nontender, nondistended, no masses or organomegaly no CVA tenderness Back exam - no tenderness Skin - warm & dry   Assessment/Plan 1. Pregnancy headache in first trimester  -Rx flexeril & reglan to take with tylenol prn  2. Abdominal pain during pregnancy in first trimester  -ultrasound shows IUP - EDD updated -reviewed return precautions  3. Chronic hypertension  -restarted labetalol at 200 mg BID  4. Human immunodeficiency virus (HIV) disease ([place])  -stable on current regimen -followed by ID  5. [redacted] weeks gestation of pregnancy       Jorje Guild, NP [facility] 12/31/2020 2:22 PM

## 2021-01-08 ENCOUNTER — Telehealth (INDEPENDENT_AMBULATORY_CARE_PROVIDER_SITE_OTHER): Payer: Self-pay

## 2021-01-08 DIAGNOSIS — O0990 Supervision of high risk pregnancy, unspecified, unspecified trimester: Secondary | ICD-10-CM | POA: Insufficient documentation

## 2021-01-08 DIAGNOSIS — Z3A00 Weeks of gestation of pregnancy not specified: Secondary | ICD-10-CM

## 2021-01-08 DIAGNOSIS — O0940 Supervision of pregnancy with grand multiparity, unspecified trimester: Secondary | ICD-10-CM

## 2021-01-08 DIAGNOSIS — Z5941 Food insecurity: Secondary | ICD-10-CM

## 2021-01-08 NOTE — Progress Notes (Signed)
Pt states a lot of dizziness when standing and leaning over, discussed with Provider Merian Capron. MD, he suggested for Pt to take OTC Dramamine or Meclizine. Pt verbalized understanding.

## 2021-01-08 NOTE — Patient Instructions (Signed)
  At our [facility], we work as an integrated team, providing care to address both physical and emotional health. Your medical provider may refer you to see our Behavioral Health Clinician (BHC) on the same day you see your medical provider, as availability permits; often scheduled virtually at your convenience.  Our BHC is available to all patients, visits generally last between 20-30 minutes, but can be longer or shorter, depending on patient need. The BHC offers help with stress management, coping with symptoms of depression and anxiety, major life changes , sleep issues, changing risky behavior, grief and loss, life stress, working on personal life goals, and  behavioral health issues, as these all affect your overall health and wellness.  The BHC is NOT available for the following: FMLA paperwork, court-ordered evaluations, specialty assessments (custody or disability), letters to employers, or obtaining certification for an emotional support animal. The BHC does not provide long-term therapy. You have the right to refuse integrated behavioral health services, or to reschedule to see the BHC at a later date.  Confidentiality exception: If it is suspected that a child or disabled adult is being abused or neglected, we are required by law to report that to either Child Protective Services or Adult Protective Services.  If you have a diagnosis of Bipolar affective disorder, Schizophrenia, or recurrent Major depressive disorder, we will recommend that you establish care with a psychiatrist, as these are lifelong, chronic conditions, and we want your overall emotional health and medications to be more closely monitored. If you anticipate needing extended maternity leave due to mental health issues postpartum, it it recommended you inform your medical provider, so we can put in a referral to a psychiatrist as soon as possible. The BHC is unable to recommend an extended maternity leave for mental  health issues. Your medical provider or BHC may refer you to a therapist for ongoing, traditional therapy, or to a psychiatrist, for medication management, if it would benefit your overall health. Depending on your insurance, you may have a copay or be charged a deductible, depending on your insurance, to see the BHC. If you are uninsured, it is recommended that you apply for financial assistance. (Forms may be requested at the front desk for in-person visits, via MyChart, or request a form during a virtual visit).  If you see the BHC more than 6 times, you will have to complete a comprehensive clinical assessment interview with the BHC to resume integrated services.  For virtual visits with the BHC, you must be physically in the state of [place] at the time of the visit. For example, if you live in [place], you will have to do an in-person visit with the BHC, and your out-of-state insurance may not cover behavioral health services in [place]. If you are going out of the state or country for any reason, the BHC may see you virtually when you return to [place], but not while you are physically outside of [place].    

## 2021-01-08 NOTE — Progress Notes (Signed)
New OB Intake  I connected with  Catherine Simmons on 01/08/21 at  8:15 AM EST by MyChart Video Visit and verified that I am speaking with the correct person using two identifiers. Nurse is located at [facility] and pt is located at home.  I discussed the limitations, risks, security and privacy concerns of performing an evaluation and management service by telephone and the availability of in person appointments. I also discussed with the patient that there may be a patient responsible charge related to this service. The patient expressed understanding and agreed to proceed.  I explained I am completing New OB Intake today. We discussed her EDD of 08/11/21 that is based on LMP of 11/04/20. Pt is G7/P4. I reviewed her allergies, medications, Medical/Surgical/OB history, and appropriate screenings. I informed her of [facility] services. Based on history, this is a/an  pregnancy complicated by See Below  .   Patient Active Problem List   Diagnosis Date Noted   Vaginal discomfort 09/30/2020   Menorrhagia with irregular cycle 09/30/2020   Abdominal pain, left lower quadrant 06/20/2020   Fatty liver 06/20/2020   Amenorrhea 11/09/2019   Iron deficiency anemia 05/13/2018   Fatigue 01/03/2018   HIV affecting pregnancy, antepartum 08/05/2016   Sickle cell trait (HCC) 07/22/2016   BMI 40.0-44.9, adult (HCC) 07/22/2016   Hidradenitis suppurativa 05/13/2016   Marijuana use 12/26/2014   Chronic hypertension 03/19/2014   Anxiety 06/30/2013   Human immunodeficiency virus (HIV) disease (HCC) 10/12/2012   CARPAL TUNNEL SYNDROME 11/14/2009   Adjustment disorder with depressed mood 05/09/2008    Concerns addressed today  Delivery Plans:  Plans to deliver at [facility].   MyChart/Babyscripts MyChart access verified. I explained pt will have some visits in office and some virtually. Babyscripts instructions given and order placed. Patient verifies receipt of registration text/e-mail. Account successfully created  and app downloaded.  Blood Pressure Cuff  Blood pressure cuff ordered for patient to pick-up from [facility] [employer]. Explained after first prenatal appt pt will check weekly and document in Babyscripts.Pt has own BP Cuff.  Weight scale: Patient    have weight scale. Weight scale ordered for patient to pick up form [facility].   Anatomy US Explained first scheduled [place] will be around 19 weeks. Anatomy US scheduled for 03/17/21 at 09:45A. Pt notified to arrive at 09:30A.  Labs Discussed Avelina Laine genetic screening with patient. Would like both Panorama and Horizon drawn at new OB visit. Routine prenatal labs needed.  Covid Vaccine Patient has not covid vaccine.   Centering in Pregnancy Candidate?  If yes, offer as possibility  Mother/ Baby Dyad Candidate?    If yes, offer as possibility  Informed patient of [facility] website  and placed link in her AVS.   Social Determinants of Health Food Insecurity: Patient denies food insecurity. WIC Referral: Patient is interested in referral to [facility].  Transportation: Patient denies transportation needs. Childcare: Discussed no children allowed at ultrasound appointments. Offered childcare services; patient declines childcare services at this time.  Send link to Pregnancy Navigators   Placed OB Box on problem list and updated  First visit review I reviewed new OB appt with pt. I explained she will have a pelvic exam, ob bloodwork with genetic screening, and PAP smear. Explained pt will be seen by Dr. [name] at first visit; encounter routed to appropriate provider. Explained that patient will be seen by pregnancy navigator following visit with provider. [facility] information placed in AVS.   Henrietta Dine,  CMA 01/08/2021  8:53 AM

## 2021-01-14 ENCOUNTER — Other Ambulatory Visit: Payer: Self-pay

## 2021-01-14 ENCOUNTER — Inpatient Hospital Stay (HOSPITAL_COMMUNITY)
Admission: AD | Admit: 2021-01-14 | Discharge: 2021-01-14 | Disposition: A | Discharge status: Home / Self Care | Payer: Medicaid Other | Attending: Family Medicine | Admitting: Family Medicine

## 2021-01-14 ENCOUNTER — Inpatient Hospital Stay (HOSPITAL_COMMUNITY): Payer: Medicaid Other

## 2021-01-14 ENCOUNTER — Encounter (HOSPITAL_COMMUNITY): Payer: Self-pay | Admitting: Family Medicine

## 2021-01-14 ENCOUNTER — Other Ambulatory Visit: Payer: Self-pay | Admitting: Obstetrics and Gynecology

## 2021-01-14 DIAGNOSIS — G932 Benign intracranial hypertension: Secondary | ICD-10-CM

## 2021-01-14 DIAGNOSIS — O0990 Supervision of high risk pregnancy, unspecified, unspecified trimester: Secondary | ICD-10-CM

## 2021-01-14 DIAGNOSIS — O26891 Other specified pregnancy related conditions, first trimester: Secondary | ICD-10-CM | POA: Insufficient documentation

## 2021-01-14 DIAGNOSIS — Z3A10 10 weeks gestation of pregnancy: Secondary | ICD-10-CM | POA: Insufficient documentation

## 2021-01-14 DIAGNOSIS — O99891 Other specified diseases and conditions complicating pregnancy: Secondary | ICD-10-CM

## 2021-01-14 DIAGNOSIS — R519 Headache, unspecified: Secondary | ICD-10-CM | POA: Diagnosis not present

## 2021-01-14 LAB — COMPREHENSIVE METABOLIC PANEL
ALT: 12 U/L (ref 0–44)
AST: 12 U/L — ABNORMAL LOW (ref 15–41)
Albumin: 3.2 g/dL — ABNORMAL LOW (ref 3.5–5.0)
Alkaline Phosphatase: 56 U/L (ref 38–126)
Anion gap: 8 (ref 5–15)
BUN: 5 mg/dL — ABNORMAL LOW (ref 6–20)
CO2: 22 mmol/L (ref 22–32)
Calcium: 8.7 mg/dL — ABNORMAL LOW (ref 8.9–10.3)
Chloride: 103 mmol/L (ref 98–111)
Creatinine, Ser: 0.48 mg/dL (ref 0.44–1.00)
GFR, Estimated: >60 mL/min (ref >60)
Glucose, Bld: 95 mg/dL (ref 70–99)
Potassium: 3.8 mmol/L (ref 3.5–5.1)
Sodium: 133 mmol/L — ABNORMAL LOW (ref 135–145)
Total Bilirubin: 0.3 mg/dL (ref 0.3–1.2)
Total Protein: 6.4 g/dL — ABNORMAL LOW (ref 6.5–8.1)

## 2021-01-14 LAB — CBC
HCT: 34.3 % — ABNORMAL LOW (ref 36.0–46.0)
Hemoglobin: 11.6 g/dL — ABNORMAL LOW (ref 12.0–15.0)
MCH: 27.5 pg (ref 26.0–34.0)
MCHC: 33.8 g/dL (ref 30.0–36.0)
MCV: 81.3 fL (ref 80.0–100.0)
Platelets: 201 10*3/uL (ref 150–400)
RBC: 4.22 MIL/uL (ref 3.87–5.11)
RDW: 11.9 % (ref 11.5–15.5)
WBC: 3.5 10*3/uL — ABNORMAL LOW (ref 4.0–10.5)
nRBC: 0 % (ref 0.0–0.2)

## 2021-01-14 LAB — URINALYSIS, ROUTINE W REFLEX MICROSCOPIC
Bilirubin Urine: NEGATIVE
Glucose, UA: NEGATIVE mg/dL
Hgb urine dipstick: NEGATIVE
Ketones, ur: NEGATIVE mg/dL
Leukocytes,Ua: NEGATIVE
Nitrite: NEGATIVE
Protein, ur: NEGATIVE mg/dL
Specific Gravity, Urine: 1.02 (ref 1.005–1.030)
pH: 6 (ref 5.0–8.0)

## 2021-01-14 LAB — PROTEIN / CREATININE RATIO, URINE
Creatinine, Urine: 160.31 mg/dL
Protein Creatinine Ratio: 0.08 mg/mg{Cre} (ref 0.00–0.15)
Total Protein, Urine: 13 mg/dL

## 2021-01-14 IMAGING — MR MR HEAD W/O CM
12 of 13 series · 44 of 48 positions shown · non-contrast
Comparison: Same day CT head.

CLINICAL DATA: Dizziness, non-specific Headache, new or worsening,
pregnant; Dural venous sinus thrombosis suspected

EXAM:
MRI HEAD WITHOUT CONTRAST
MRV HEAD WITHOUT CONTRAST
TECHNIQUE: Multiplanar, multi-echo pulse sequences of the brain and surrounding
structures were acquired without intravenous contrast. Angiographic
images of the intracranial venous structures were acquired using MRV
technique without intravenous contrast.

[Series 5: DWI · axial · 3.0mm · 0.88mm/px · z∈[-116,+32]mm · 8 of 104 slices shown (1 of 4)]
[im 1/104]
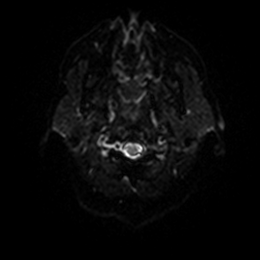
[im 15/104]
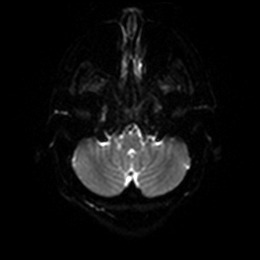
[im 30/104]
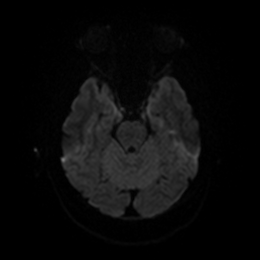
[im 45/104]
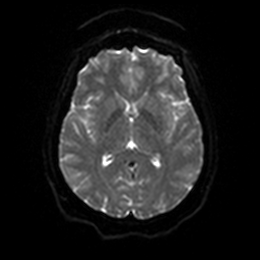
[im 59/104]
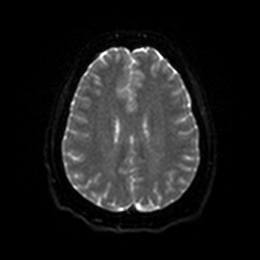
[im 74/104]
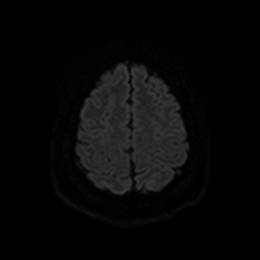
[im 89/104]
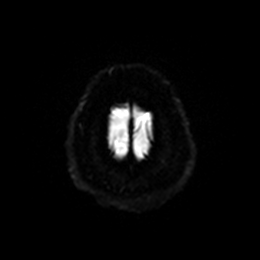
[im 104/104]
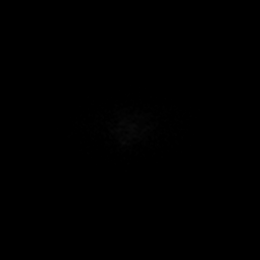

[Series 6: DWI · axial · 3.0mm · 0.88mm/px · z∈[-116,+32]mm · 4 of 52 slices shown (2 of 4)]
[im 1/52]
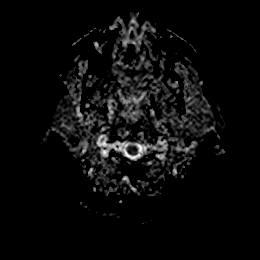
[im 18/52]
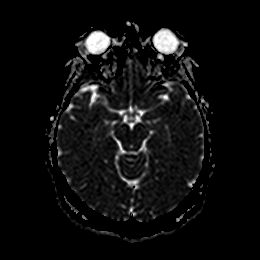
[im 35/52]
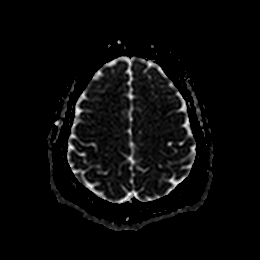
[im 52/52]
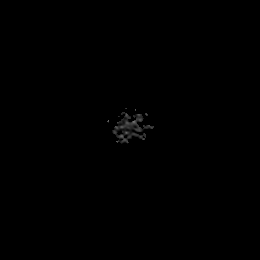

[Series 7: DWI · coronal · 4.0mm · 0.88mm/px · 5 of 68 slices shown (3 of 4)]
[im 1/68]
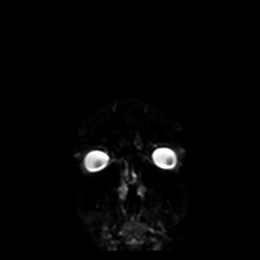
[im 17/68]
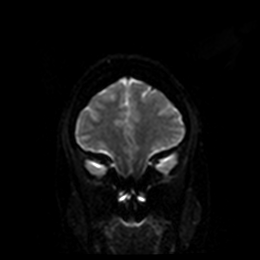
[im 34/68]
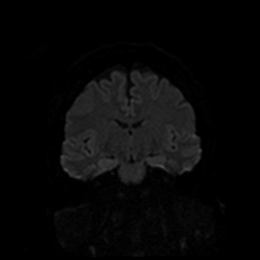
[im 51/68]
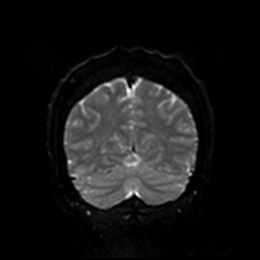
[im 68/68]
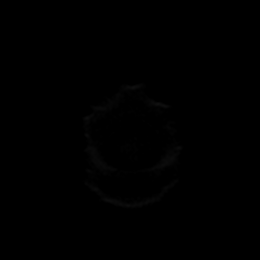

[Series 8: DWI · coronal · 4.0mm · 0.88mm/px · 3 of 34 slices shown (4 of 4)]
[im 1/34]
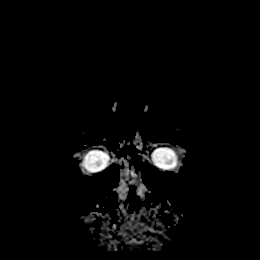
[im 17/34]
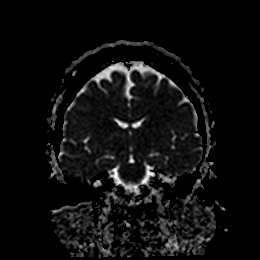
[im 34/34]
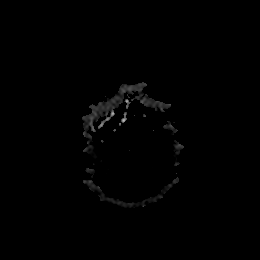

[Series 9: T1 · sagittal · 5.0mm · 0.75mm/px · 2 of 23 slices shown]
[im 1/23]
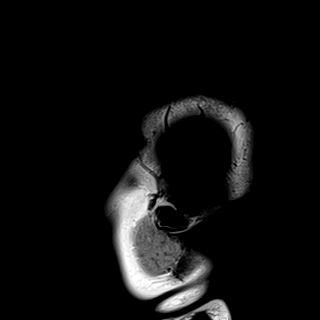
[im 23/23]
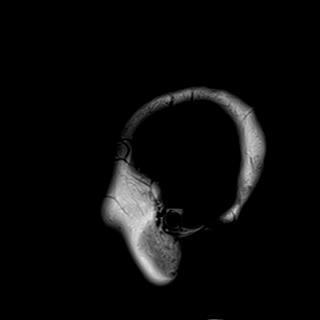

[Series 10: T2 · axial · 5.0mm · 0.72mm/px · z∈[-123,+17]mm · 2 of 25 slices shown (1 of 2)]
[im 1/25]
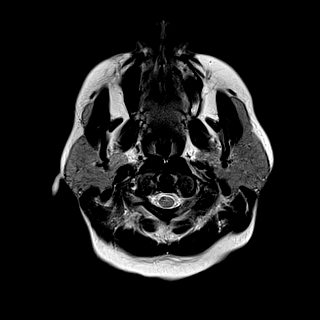
[im 25/25]
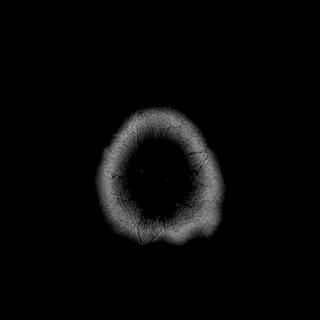

[Series 11: FLAIR · axial · 5.0mm · 0.45mm/px · z∈[-124,+15]mm · 2 of 25 slices shown]
[im 1/25]
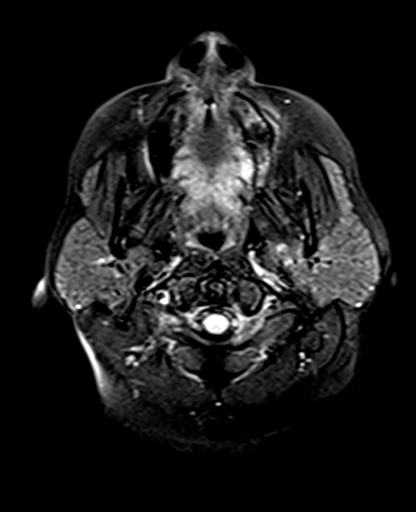
[im 25/25]
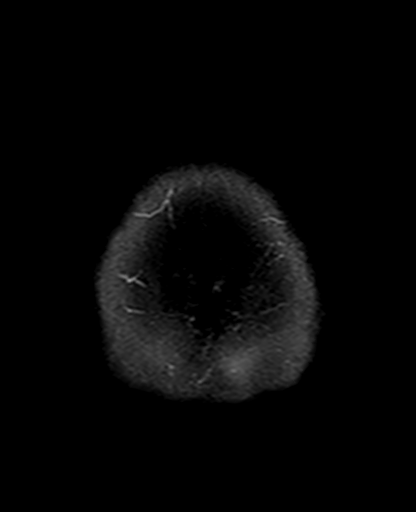

[Series 12: mag_images · axial · 3.0mm · 0.90mm/px · z∈[-129,+43]mm · 4 of 60 slices shown]
[im 1/60]
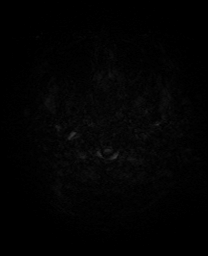
[im 20/60]
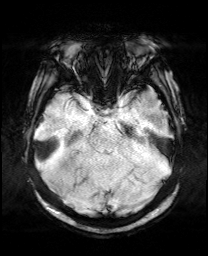
[im 40/60]
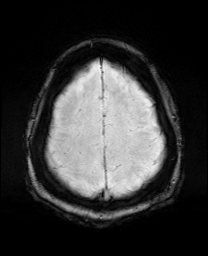
[im 60/60]
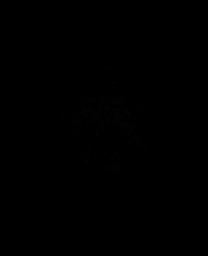

[Series 13: pha_images · axial · 3.0mm · 0.90mm/px · z∈[-126,+37]mm · 4 of 57 slices shown]
[im 1/57]
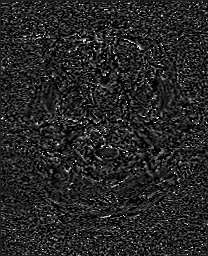
[im 19/57]
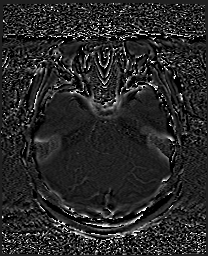
[im 38/57]
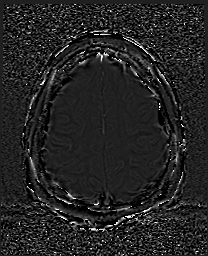
[im 57/57]
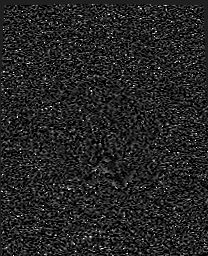

[Series 14: swi_images · axial · 3.0mm · 0.90mm/px · z∈[-129,+43]mm · 4 of 60 slices shown]
[im 1/60]
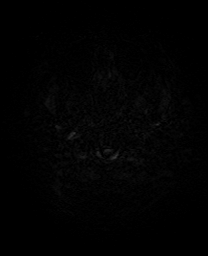
[im 20/60]
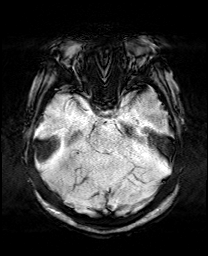
[im 40/60]
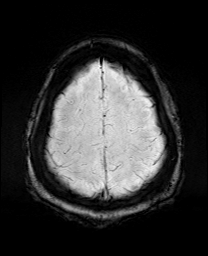
[im 60/60]
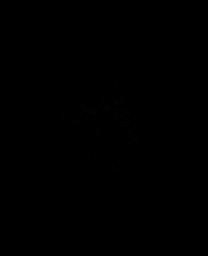

[Series 15: mip_images(sw) · axial · 24.0mm · 0.90mm/px · z∈[-119,+33]mm · 4 of 53 slices shown]
[im 1/53]
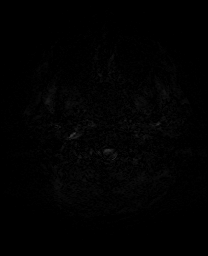
[im 18/53]
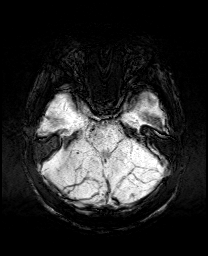
[im 35/53]
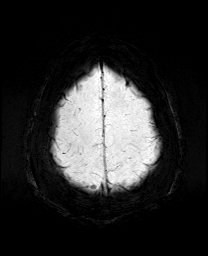
[im 53/53]
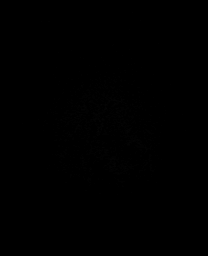

[Series 17: T2 · coronal · 5.0mm · 0.34mm/px · 2 of 29 slices shown (2 of 2)]
[im 1/29]
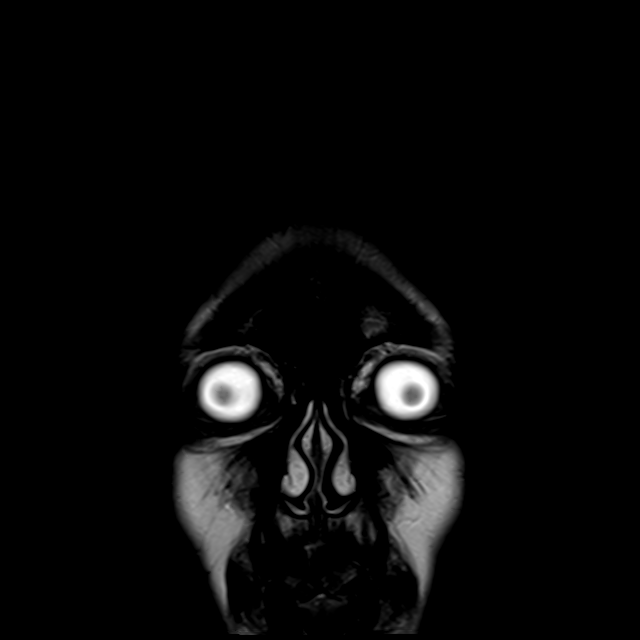
[im 29/29]
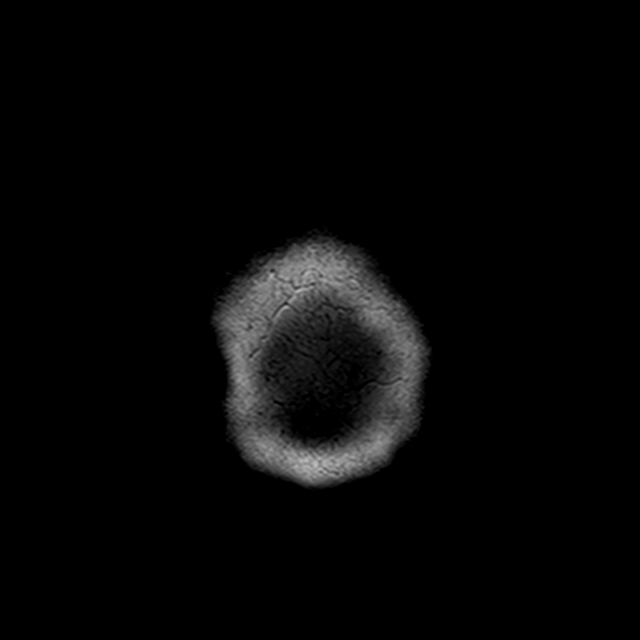

[44 of 48 positions shown; findings below may reference images not displayed]

FINDINGS: MRI HEAD WITHOUT CONTRAST

Brain: No acute infarction, hemorrhage, hydrocephalus, extra-axial
collection or mass lesion. Partially empty sella.

Vascular: Major arterial flow voids are maintained at the skull
base.

Skull and upper cervical spine: Normal marrow signal.

Sinuses/Orbits: Clear sinuses.  Unremarkable orbits.

Other: No sizable mastoid effusions.

MR VENOGRAM WITHOUT CONTRAST

No evidence of dural venous sinus thrombosis. The superior sagittal
sinus, transverse sinuses, and sigmoid sinuses are patent. Bilateral
visualized upper internal jugular veins are patent. The visualized
deep cerebral veins are patent. Stenosis of the distal transverse
sinuses bilaterally.
IMPRESSION: 1. No evidence of acute intracranial abnormality.
2. No evidence of dural venous sinus thrombosis.
3. Partially empty sella none and narrowing of the distal transverse
sinuses bilaterally. These findings could be anatomic variants, but
can be seen with idiopathic intracranial hypertension in the correct
clinical setting.

## 2021-01-14 IMAGING — MR MR MRV HEAD W/O CM
2 series · 46 of 48 positions shown · non-contrast
Comparison: Same day CT head.

CLINICAL DATA: Dizziness, non-specific Headache, new or worsening,
pregnant; Dural venous sinus thrombosis suspected

EXAM:
MRI HEAD WITHOUT CONTRAST
MRV HEAD WITHOUT CONTRAST
TECHNIQUE: Multiplanar, multi-echo pulse sequences of the brain and surrounding
structures were acquired without intravenous contrast. Angiographic
images of the intracranial venous structures were acquired using MRV
technique without intravenous contrast.

[Series 5: tof_fl2d_paracor · coronal · 2.0mm · 0.98mm/px · 19 of 130 slices shown]
[im 1/130]
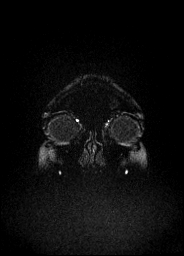
[im 8/130]
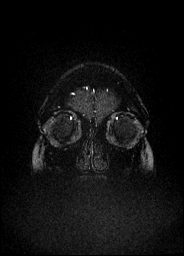
[im 15/130]
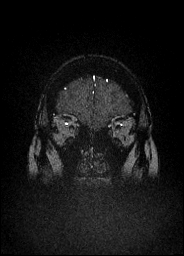
[im 22/130]
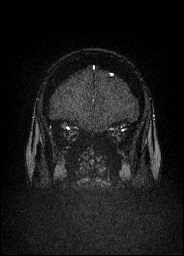
[im 29/130]
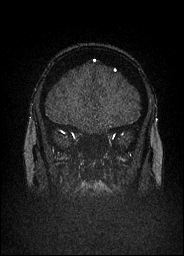
[im 36/130]
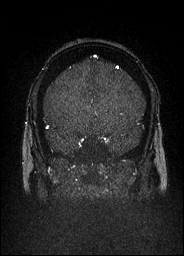
[im 44/130]
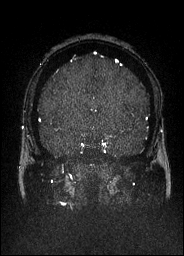
[im 51/130]
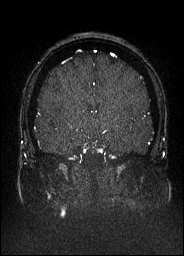
[im 58/130]
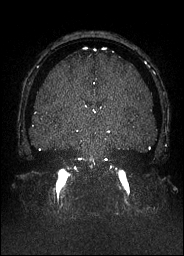
[im 65/130]
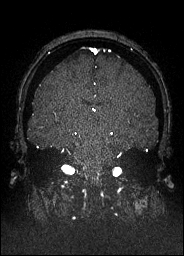
[im 72/130]
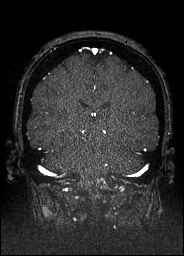
[im 79/130]
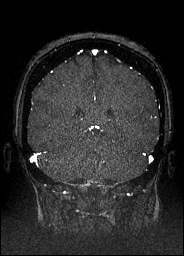
[im 87/130]
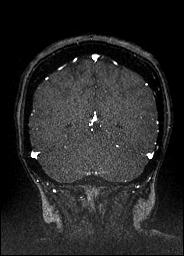
[im 94/130]
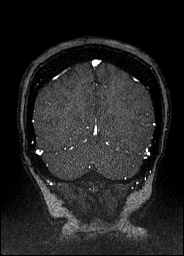
[im 101/130]
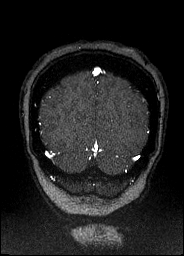
[im 108/130]
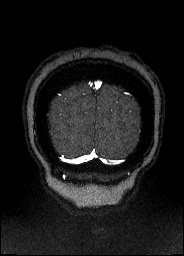
[im 115/130]
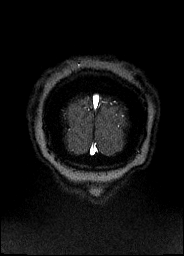
[im 122/130]
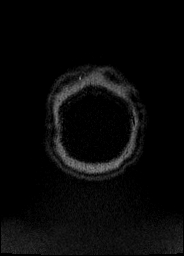
[im 130/130]
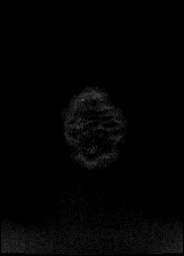

[Series 10: venous inhance coronal_msum · coronal · portal-venous · 0.9mm · 0.57mm/px · 27 of 207 slices shown]
[im 1/207]
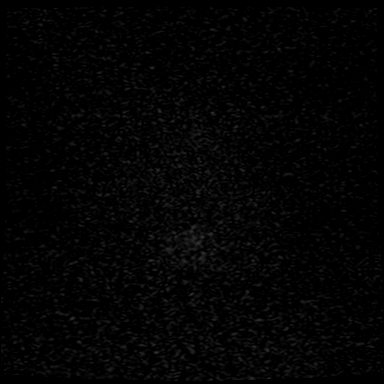
[im 8/207]
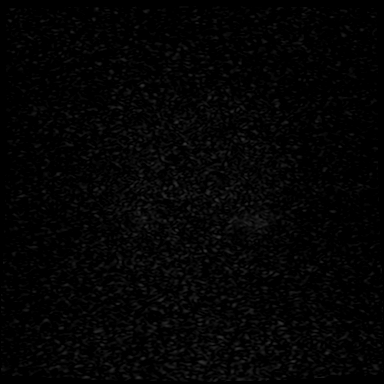
[im 15/207]
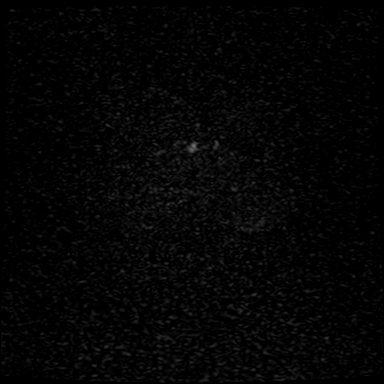
[im 23/207]
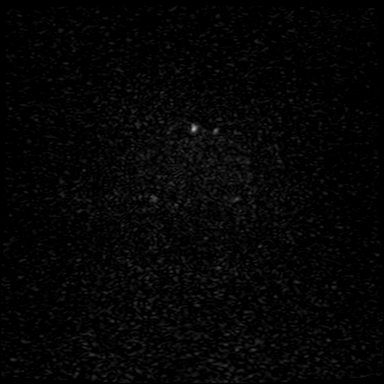
[im 30/207]
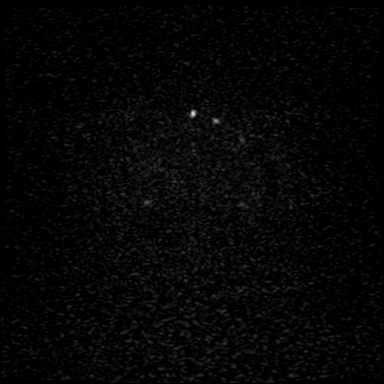
[im 37/207]
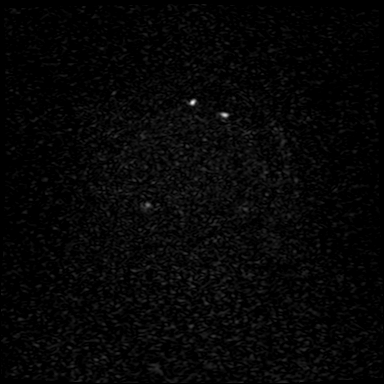
[im 45/207]
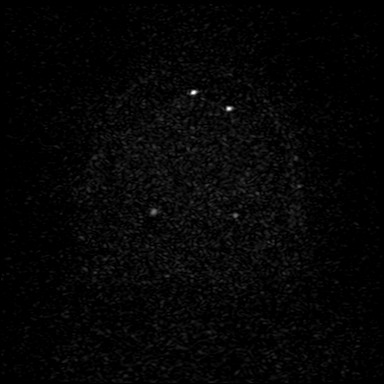
[im 52/207]
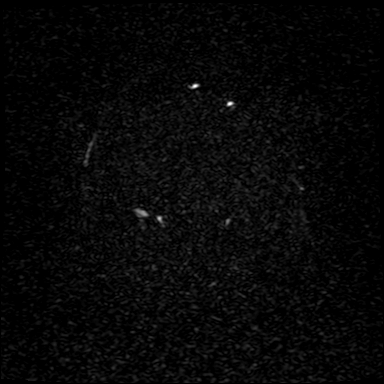
[im 59/207]
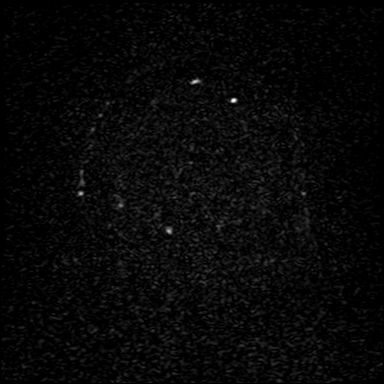
[im 67/207]
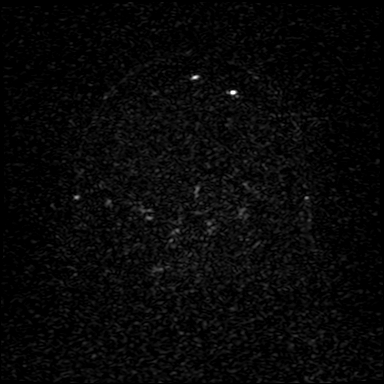
[im 74/207]
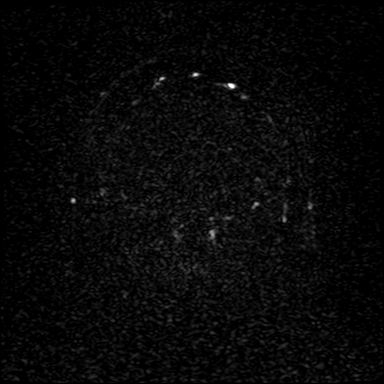
[im 81/207]
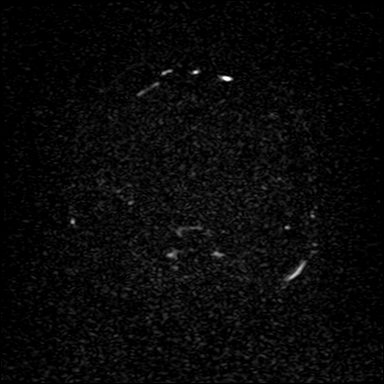
[im 89/207]
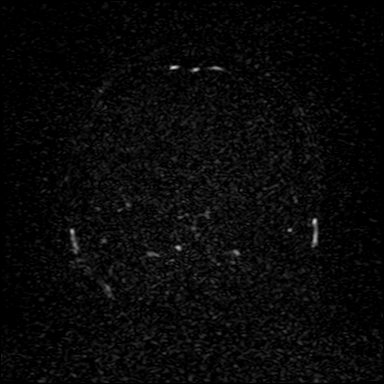
[im 96/207]
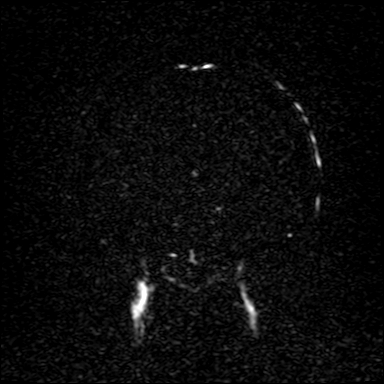
[im 104/207]
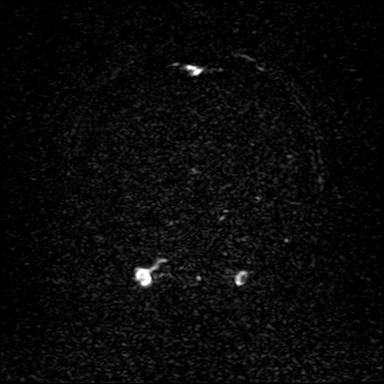
[im 111/207]
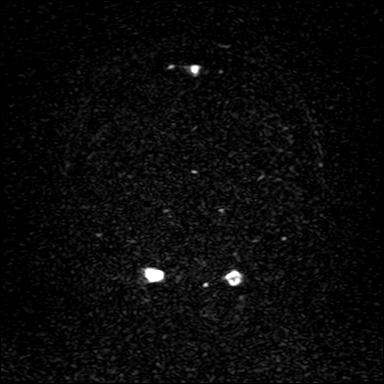
[im 118/207]
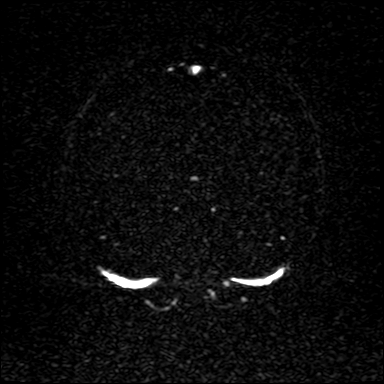
[im 126/207]
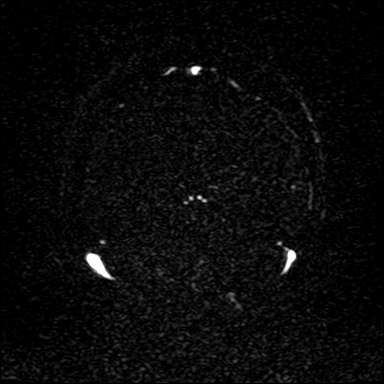
[im 133/207]
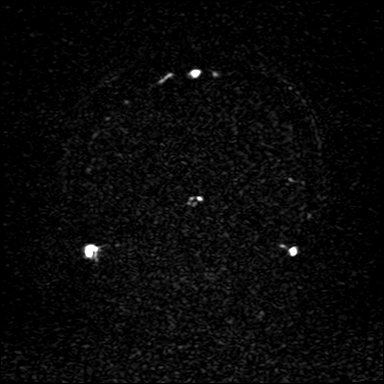
[im 140/207]
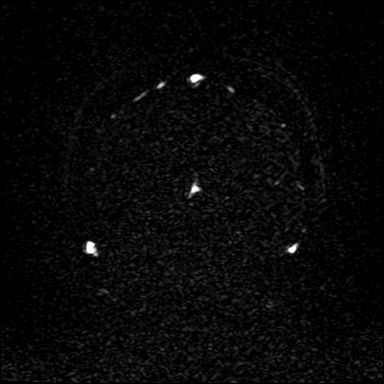
[im 148/207]
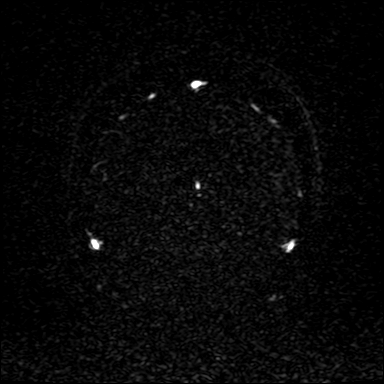
[im 155/207]
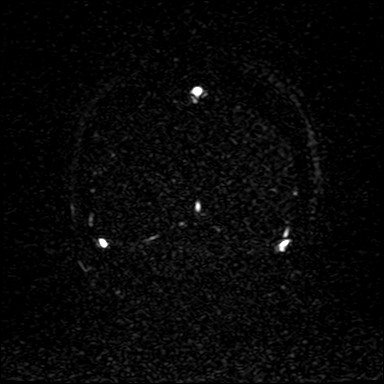
[im 162/207]
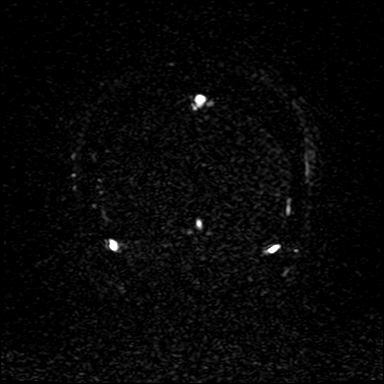
[im 170/207]
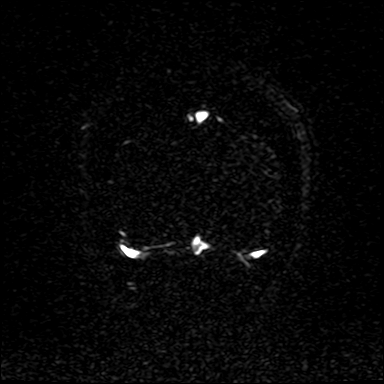
[im 177/207]
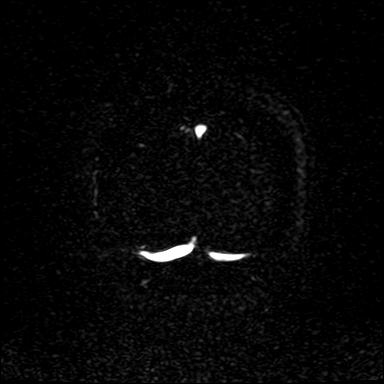
[im 184/207]
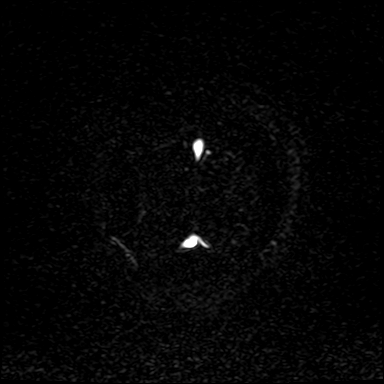
[im 199/207]
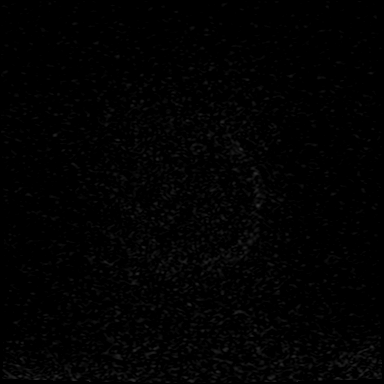

[46 of 48 positions shown; findings below may reference images not displayed]

FINDINGS: MRI HEAD WITHOUT CONTRAST

Brain: No acute infarction, hemorrhage, hydrocephalus, extra-axial
collection or mass lesion. Partially empty sella.

Vascular: Major arterial flow voids are maintained at the skull
base.

Skull and upper cervical spine: Normal marrow signal.

Sinuses/Orbits: Clear sinuses.  Unremarkable orbits.

Other: No sizable mastoid effusions.

MR VENOGRAM WITHOUT CONTRAST

No evidence of dural venous sinus thrombosis. The superior sagittal
sinus, transverse sinuses, and sigmoid sinuses are patent. Bilateral
visualized upper internal jugular veins are patent. The visualized
deep cerebral veins are patent. Stenosis of the distal transverse
sinuses bilaterally.
IMPRESSION: 1. No evidence of acute intracranial abnormality.
2. No evidence of dural venous sinus thrombosis.
3. Partially empty sella none and narrowing of the distal transverse
sinuses bilaterally. These findings could be anatomic variants, but
can be seen with idiopathic intracranial hypertension in the correct
clinical setting.

## 2021-01-14 IMAGING — CT CT HEAD W/O CM
4 series · 17 of 47 positions shown, 19 images · non-contrast
Comparison: CT head [DATE].

CLINICAL DATA: Headache, new or worsening, pregnant

EXAM:
CT HEAD WITHOUT CONTRAST
TECHNIQUE: Contiguous axial images were obtained from the base of the skull
through the vertex without intravenous contrast.

[Series 3: head wo · axial · 0.42mm/px · z∈[+1242,+1358]mm · 7 of 31 slices shown, 9 images]
[im 4/31  brain]
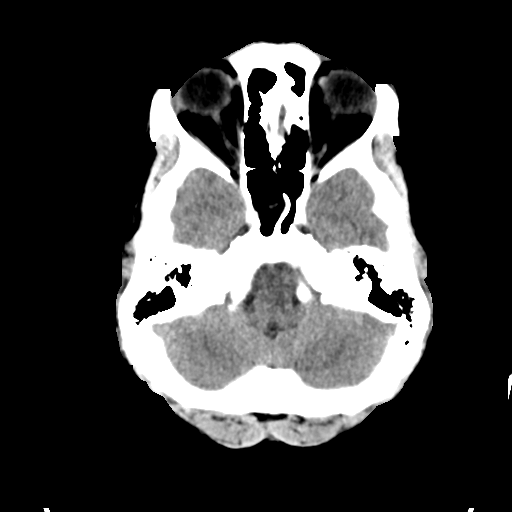
[im 4/31  bone]
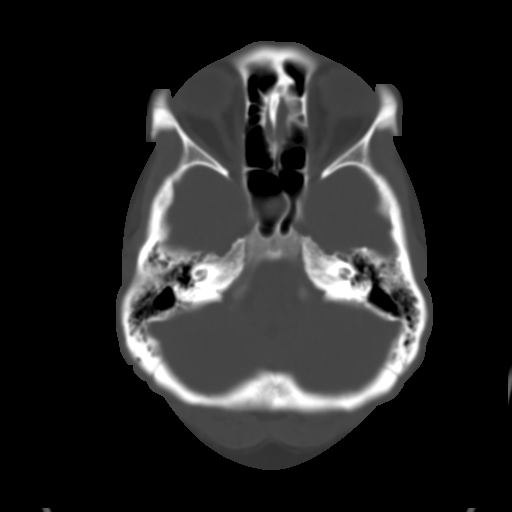
[im 8/31  brain]
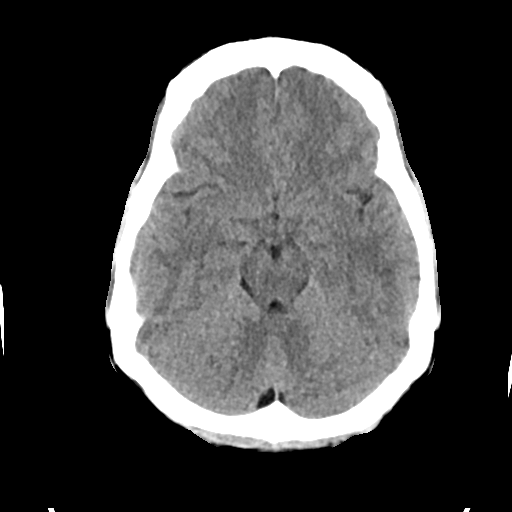
[im 12/31  brain]
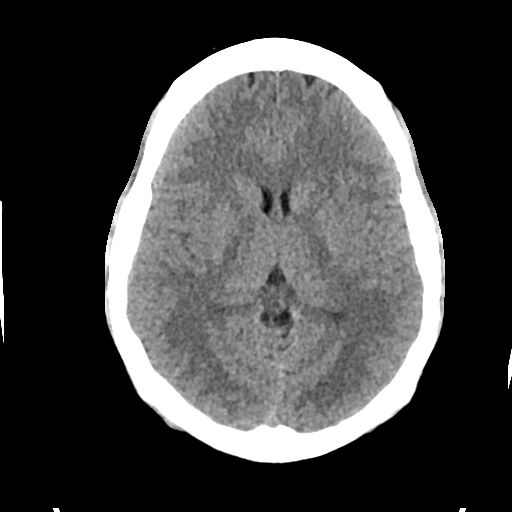
[im 16/31  brain]
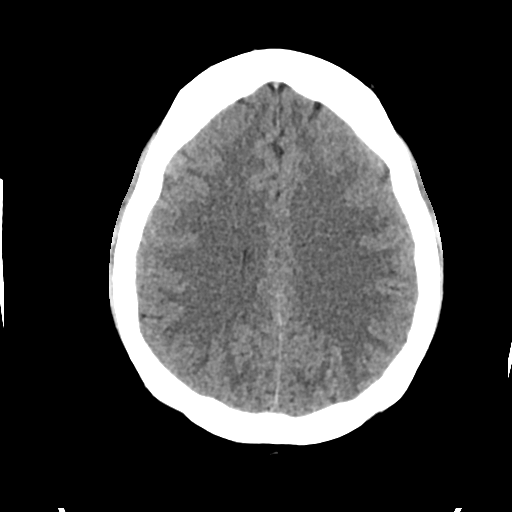
[im 19/31  brain]
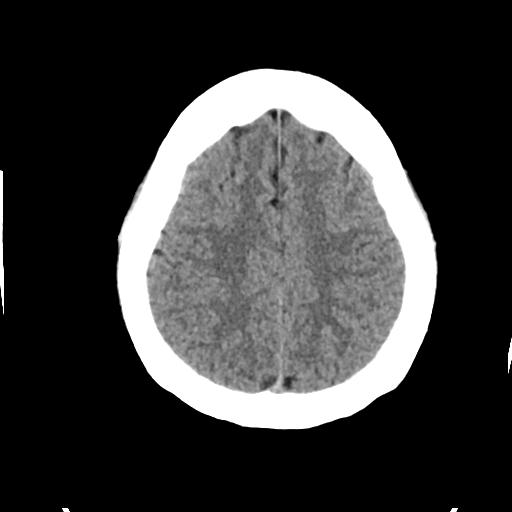
[im 19/31  bone]
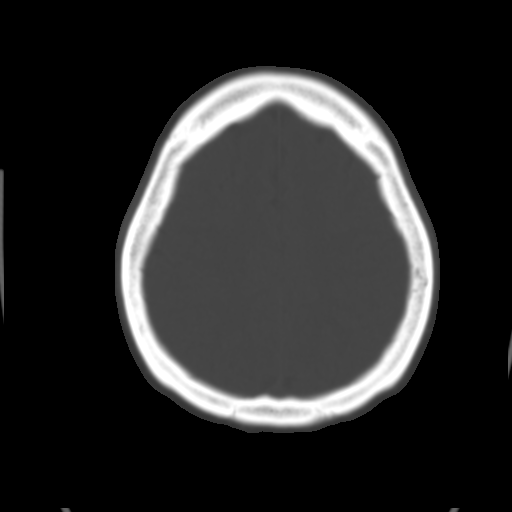
[im 23/31  brain]
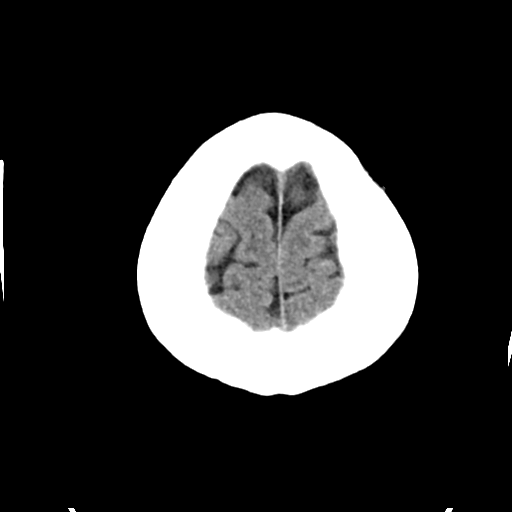
[im 27/31  brain]
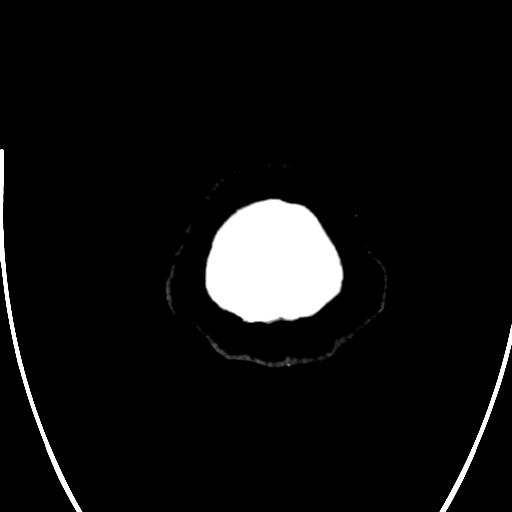

[Series 4: head bone · axial · 0.42mm/px · z∈[+1242,+1296]mm · 4 of 77 slices shown]
[im 8/77  bone]
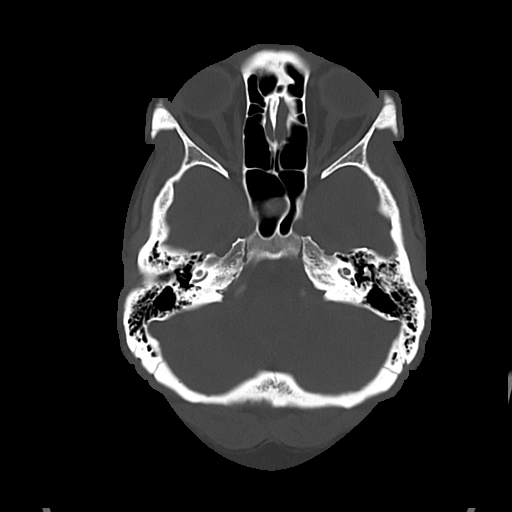
[im 16/77  bone]
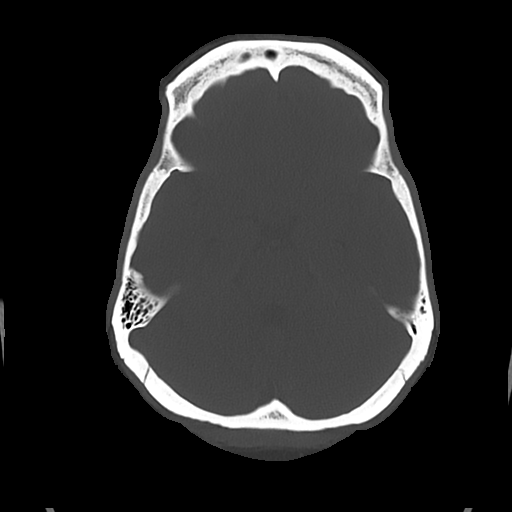
[im 23/77  bone]
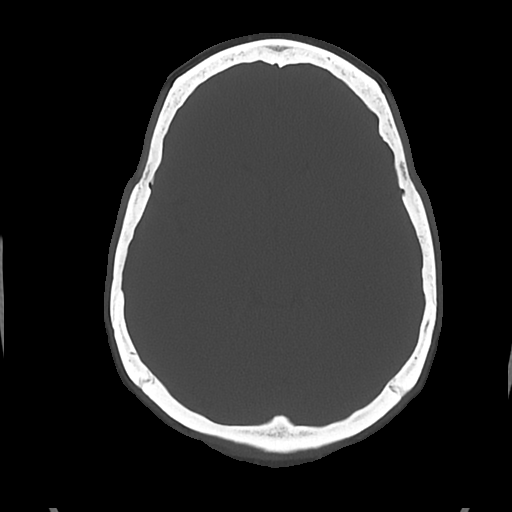
[im 35/77  bone]
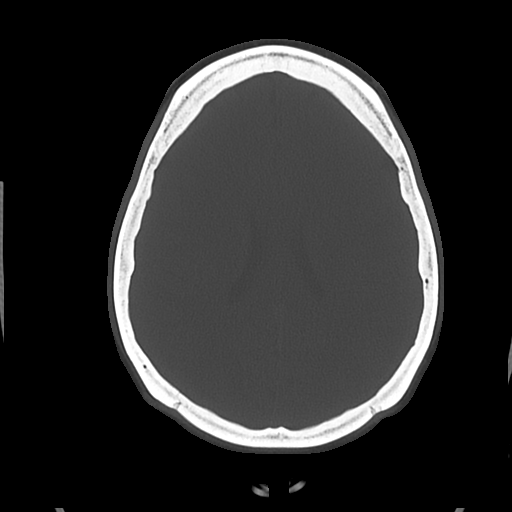

[Series 5: cor soft · coronal · 0.33mm/px · 3 of 66 slices shown]
[im 22/66  brain]
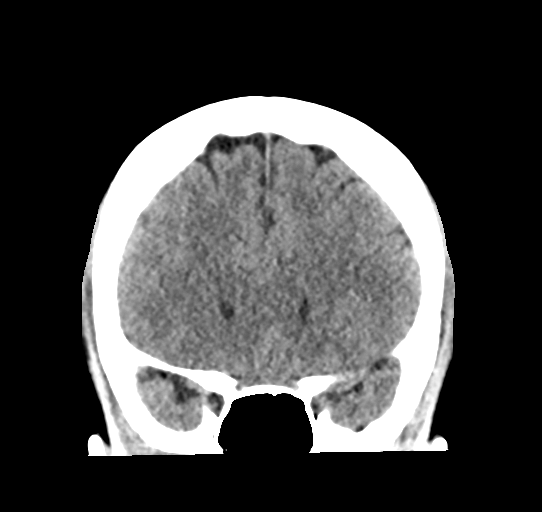
[im 29/66  brain]
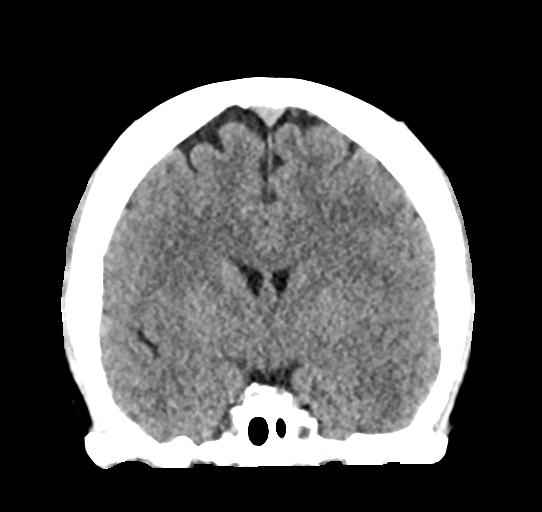
[im 37/66  brain]
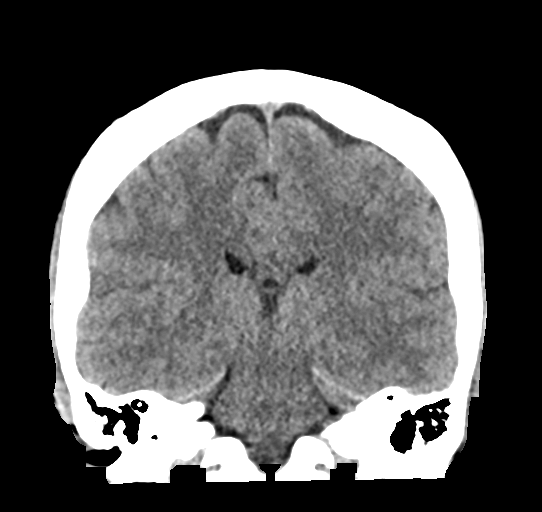

[Series 6: sag soft · sagittal · 0.33mm/px · 3 of 60 slices shown]
[im 20/60  brain]
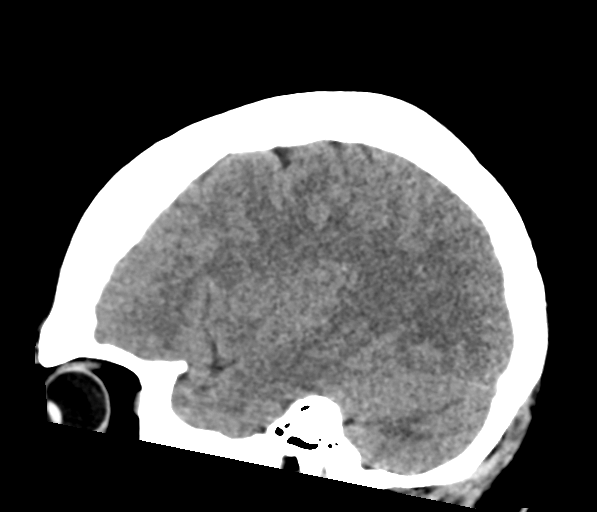
[im 30/60  brain]
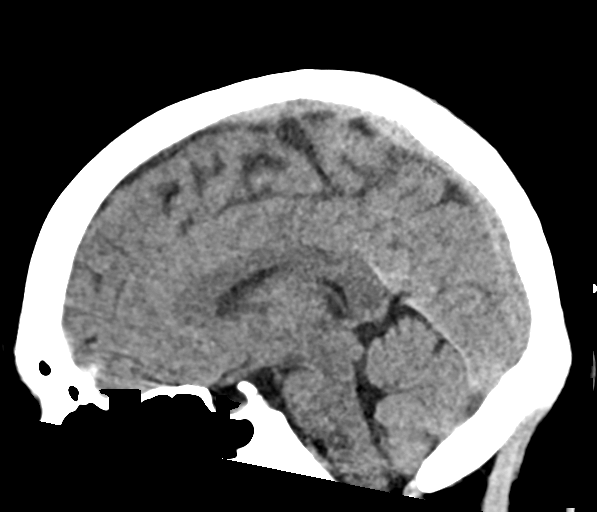
[im 40/60  brain]
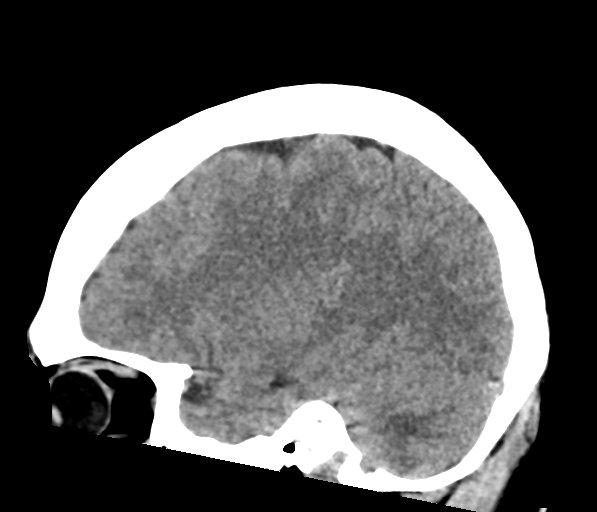

[17 of 47 positions shown; findings below may reference images not displayed]

FINDINGS: Brain: No evidence of acute infarction, hemorrhage, hydrocephalus,
extra-axial collection or mass lesion/mass effect. Partially empty
sella.

Vascular: No hyperdense vessel identified.

Skull: No evidence of acute fracture.

Sinuses/Orbits: Clear sinuses.  Unremarkable orbits.

Other: No mastoid effusions.
IMPRESSION: 1. No evidence of acute intracranial abnormality.
2. Partially empty sella, which is often a normal anatomic variant
but can be associated with idiopathic intracranial hypertension.

## 2021-01-14 MED ORDER — CYCLOBENZAPRINE HCL 5 MG PO TABS
10.0000 mg | ORAL_TABLET | Freq: Once | ORAL | Status: AC
  Administered 2021-01-14: 10 mg via ORAL
  Filled 2021-01-14: qty 2

## 2021-01-14 MED ORDER — OXYCODONE-ACETAMINOPHEN 5-325 MG PO TABS
1.0000 | ORAL_TABLET | Freq: Four times a day (QID) | ORAL | 0 refills | Status: DC | PRN
Start: 2021-01-14 — End: 2021-01-20

## 2021-01-14 MED ORDER — ONDANSETRON HCL 4 MG/2ML IJ SOLN
4.0000 mg | Freq: Once | INTRAMUSCULAR | Status: AC
  Administered 2021-01-14: 4 mg via INTRAVENOUS
  Filled 2021-01-14: qty 2

## 2021-01-14 MED ORDER — OXYCODONE-ACETAMINOPHEN 5-325 MG PO TABS
2.0000 | ORAL_TABLET | Freq: Once | ORAL | Status: AC
  Administered 2021-01-14: 2 via ORAL
  Filled 2021-01-14: qty 2

## 2021-01-14 MED ORDER — LACTATED RINGERS IV SOLN: INTRAVENOUS | Status: DC

## 2021-01-14 MED ORDER — DIPHENHYDRAMINE HCL 50 MG/ML IJ SOLN
25.0000 mg | Freq: Once | INTRAMUSCULAR | Status: AC
  Administered 2021-01-14: 25 mg via INTRAVENOUS
  Filled 2021-01-14: qty 1

## 2021-01-14 MED ORDER — DEXAMETHASONE SODIUM PHOSPHATE 10 MG/ML IJ SOLN
10.0000 mg | Freq: Once | INTRAMUSCULAR | Status: AC
  Administered 2021-01-14: 10 mg via INTRAVENOUS
  Filled 2021-01-14: qty 1

## 2021-01-14 MED ORDER — LACTATED RINGERS IV BOLUS
1000.0000 mL | Freq: Once | INTRAVENOUS | Status: AC
  Administered 2021-01-14: 1000 mL via INTRAVENOUS

## 2021-01-14 NOTE — Discharge Instructions (Signed)
Please go to [facility] if your headache returns to the intensity that it was today.

## 2021-01-14 NOTE — MAU Provider Note (Signed)
History     CSN: [id number]  Arrival date and time: 01/14/21 4098   Event Date/Time   First Provider Initiated Contact with Patient 01/14/21 1042      Chief Complaint  Patient presents with   Headache   Dizziness   Emesis   Catherine Simmons is a 35 y.o. year old [id number] female at [redacted]w[redacted]d weeks gestation who presents to MAU reporting H/A x 2 weeks that has been worsening over the past 5 days. She states she has been taking Reglan 10 mg for H/As; last taken at 0700. She reports Reglan "helps a little, but then the H/A comes right back." She has also been having vomiting for the past 3-4 days. She is very tearful stating, "I just can't take it anymore and I don't know what to do." She reports her mother and sister both have a h/o brain aneurysms at the back of their heads. She is worried that may be happening to her. Her high risk pregnancy is managed by [facility]; her next appt is 01/20/2021. Her FOB is present and contributing to the history taking.    OB History     Gravida  7   Para  5   Term  4   Preterm  1   AB  1   Living  5      SAB      IAB  1   Ectopic      Multiple  0   Live Births  5           Past Medical History:  Diagnosis Date   Anemia    Cholecystitis 07/16/2010   Depression    Genital warts 2004   Headache    Hemorrhoids    HIV (human immunodeficiency virus infection) (HCC)    Hx of pelvic inflammatory disease 08/31/2013   And hx of multiple STDs    Hypertension    Ovarian cyst    Pregnancy induced hypertension    Sickle cell trait (HCC)    UTI (urinary tract infection)     Past Surgical History:  Procedure Laterality Date   CHOLECYSTECTOMY  07/19/2010   EXTERNAL CEPHALIC VERSION  12/11/2016       WISDOM TOOTH EXTRACTION      Family History  Problem Relation Age of Onset   Cancer Mother    Hypertension Mother    Diabetes Mother    Pancreatitis Mother    Hypertension Father    Diabetes Maternal Aunt     Social  History   Tobacco Use   Smoking status: Former    Years: 15.00    Types: Cigarettes    Start date: 03/11/2012   Smokeless tobacco: Never  Vaping Use   Vaping Use: Never used  Substance Use Topics   Alcohol use: Not Currently    Comment: occ   Drug use: Not Currently    Frequency: 5.0 times per week    Types: Marijuana    Comment: occ    Allergies:  Allergies  Allergen Reactions   Butorphanol Itching    Tolerates percocet   Stadol [Butorphanol Tartrate] Itching    Tolerates percocet    Medications Prior to Admission  Medication Sig Dispense Refill Last Dose   abacavir-dolutegravir-lamiVUDine (TRIUMEQ) 600-50-300 MG tablet Take 1 tablet by mouth daily. 30 tablet 0 01/14/2021   cyclobenzaprine (FLEXERIL) 5 MG tablet Take 1 tablet (5 mg total) by mouth 3 (three) times daily as needed for muscle spasms (or headache). 30  tablet 0 01/13/2021   metoCLOPramide (REGLAN) 10 MG tablet Take 1 tablet (10 mg total) by mouth every 8 (eight) hours as needed for nausea (or headache). 30 tablet 0 01/14/2021 at 0700   labetalol (NORMODYNE) 200 MG tablet Take 1 tablet (200 mg total) by mouth 2 (two) times daily. (Patient not taking: Reported on 01/08/2021) 60 tablet 0     Review of Systems  Constitutional: Negative.   HENT: Negative.    Eyes: Negative.   Respiratory: Negative.    Cardiovascular: Negative.   Gastrointestinal:  Positive for nausea and vomiting.  Endocrine: Negative.   Genitourinary: Negative.   Musculoskeletal: Negative.   Skin: Negative.   Allergic/Immunologic: Negative.   Neurological:  Positive for dizziness and headaches.  Hematological: Negative.   Psychiatric/Behavioral: Negative.    Physical Exam   Patient Vitals for the past 24 hrs:  BP Temp Temp src Pulse Resp SpO2 Height Weight  01/14/21 1450 -- -- -- -- -- 100 % -- --  01/14/21 1445 -- -- -- -- -- 100 % -- --  01/14/21 1440 -- -- -- -- -- 100 % -- --  01/14/21 1439 (!) 145/75 -- -- 71 -- -- -- --  01/14/21  1246 131/73 -- -- 66 -- -- -- --  01/14/21 1245 -- -- -- -- -- 100 % -- --  01/14/21 1240 -- -- -- -- -- 100 % -- --  01/14/21 1239 137/80 -- -- 66 -- -- -- --  01/14/21 1030 (!) 141/92 -- -- 82 -- 99 % -- --  01/14/21 1025 -- -- -- -- -- 100 % -- --  01/14/21 1020 -- -- -- -- -- 100 % -- --  01/14/21 1019 (!) 148/95 -- -- 82 -- -- -- --  01/14/21 1017 (!) 148/95 98.6 F (37 C) Oral 78 20 100 % -- --  01/14/21 1008 -- -- -- -- -- -- 5\' 2"  (1.575 m) 101.6 kg    Physical Exam Vitals and nursing note reviewed.  Constitutional:      Appearance: Normal appearance. She is obese.  HENT:     Head: Normocephalic and atraumatic.  Eyes:     Comments: Eyes assessed by Dr.  Skin:    General: Skin is warm and dry.  Neurological:     Mental Status: She is alert and oriented to person, place, and time.  Psychiatric:        Mood and Affect: Mood normal.        Behavior: Behavior normal.        Thought Content: Thought content normal.        Judgment: Judgment normal.    MAU Course  Procedures  MDM [facility] Received headache cocktail (LR 1000 mL @ 999 mL/hr with Benadryl 25 mg IVP, Zofran 4 mg IVP, Decadron 10 mg IVP) -- reports "no relief"..rated 7/10 (was 10/10) CT Head w/o contrast Flexeril 10 mg po -- no relief pain MRI MR Venogram Percocet 5/325 mg x 2 tablets -- pain improved, "H/A keeps coming back, but is now down to 5/10"   *Consult with Dr. Shawnie Pons @ 1410 - notified of patient's complaints, assessments, lab & CT results, Dr. Shawnie Pons at bedside @ 1415 to examine patient for intracranial hypertension - unable to visualize disc clearly; recommended tx plan give Flexeril for additional pain control and consult neurology for following steps  *Consult with Dr. Shawnie Pons @ 1424 - notified of patient's complaints, assessments, lab & CT results, recommended tx plan order MRI, comment  time of flight images, sinus venous thrombus, and MRV - ok to setup f/u with neurology, if negative  MRI/MRV  *Consult with Dr. Jolayne Panther @ [phone] - notified of patient's complaints, assessments, lab & CT/MRI/MRV results, recommended tx plan give Percocet 2 tablets and reassess  Update Dr. Jolayne Panther on Reassessment @ 2000: H/A down to 5/10 after Percocet -- ok to d/c home  Results for orders placed or performed during the hospital encounter of 01/14/21 (from the past 24 hour(s))  Urinalysis, Routine w reflex microscopic Urine, Clean Catch     Status: None   Collection Time: 01/14/21 10:34 AM  Result Value Ref Range   Color, Urine YELLOW YELLOW   APPearance CLEAR CLEAR   Specific Gravity, Urine 1.020 1.005 - 1.030   pH 6.0 5.0 - 8.0   Glucose, UA NEGATIVE NEGATIVE mg/dL   Hgb urine dipstick NEGATIVE NEGATIVE   Bilirubin Urine NEGATIVE NEGATIVE   Ketones, ur NEGATIVE NEGATIVE mg/dL   Protein, ur NEGATIVE NEGATIVE mg/dL   Nitrite NEGATIVE NEGATIVE   Leukocytes,Ua NEGATIVE NEGATIVE  Protein / creatinine ratio, urine     Status: None   Collection Time: 01/14/21 10:34 AM  Result Value Ref Range   Creatinine, Urine 160.31 mg/dL   Total Protein, Urine 13 mg/dL   Protein Creatinine Ratio 0.08 0.00 - 0.15 mg/mg[Cre]  CBC     Status: Abnormal   Collection Time: 01/14/21 10:59 AM  Result Value Ref Range   WBC 3.5 (L) 4.0 - 10.5 K/uL   RBC 4.22 3.87 - 5.11 MIL/uL   Hemoglobin 11.6 (L) 12.0 - 15.0 g/dL   HCT 95.2 (L) 84.1 - 32.4 %   MCV 81.3 80.0 - 100.0 fL   MCH 27.5 26.0 - 34.0 pg   MCHC 33.8 30.0 - 36.0 g/dL   RDW 40.1 02.7 - 25.3 %   Platelets 201 150 - 400 K/uL   nRBC 0.0 0.0 - 0.2 %  Comprehensive metabolic panel     Status: Abnormal   Collection Time: 01/14/21 10:59 AM  Result Value Ref Range   Sodium 133 (L) 135 - 145 mmol/L   Potassium 3.8 3.5 - 5.1 mmol/L   Chloride 103 98 - 111 mmol/L   CO2 22 22 - 32 mmol/L   Glucose, Bld 95 70 - 99 mg/dL   BUN 5 (L) 6 - 20 mg/dL   Creatinine, Ser 6.64 0.44 - 1.00 mg/dL   Calcium 8.7 (L) 8.9 - 10.3 mg/dL   Total Protein 6.4 (L) 6.5 - 8.1  g/dL   Albumin 3.2 (L) 3.5 - 5.0 g/dL   AST 12 (L) 15 - 41 U/L   ALT 12 0 - 44 U/L   Alkaline Phosphatase 56 38 - 126 U/L   Total Bilirubin 0.3 0.3 - 1.2 mg/dL   GFR, Estimated >40 >34 mL/min   Anion gap 8 5 - 15    CT HEAD WO CONTRAST ( )  Result Date: 01/14/2021 CLINICAL DATA:  Headache, new or worsening, pregnant EXAM: CT HEAD WITHOUT CONTRAST TECHNIQUE: Contiguous axial images were obtained from the base of the skull through the vertex without intravenous contrast. COMPARISON:  CT head April 01, 2020. FINDINGS: Brain: No evidence of acute infarction, hemorrhage, hydrocephalus, extra-axial collection or mass lesion/mass effect. Partially empty sella. Vascular: No hyperdense vessel identified. Skull: No evidence of acute fracture. Sinuses/Orbits: Clear sinuses.  Unremarkable orbits. Other: No mastoid effusions. IMPRESSION: 1. No evidence of acute intracranial abnormality. 2. Partially empty sella, which is often a normal anatomic variant  but can be associated with idiopathic intracranial hypertension. Electronically Signed   By: Feliberto Harts M.D.   On: 01/14/2021 13:36   MR BRAIN WO CONTRAST  Result Date: 01/14/2021 CLINICAL DATA:  Dizziness, non-specific Headache, new or worsening, pregnant; Dural venous sinus thrombosis suspected EXAM: MRI HEAD WITHOUT CONTRAST MRV HEAD WITHOUT CONTRAST TECHNIQUE: Multiplanar, multi-echo pulse sequences of the brain and surrounding structures were acquired without intravenous contrast. Angiographic images of the intracranial venous structures were acquired using MRV technique without intravenous contrast. COMPARISON:  Same day CT head. FINDINGS: MRI HEAD WITHOUT CONTRAST Brain: No acute infarction, hemorrhage, hydrocephalus, extra-axial collection or mass lesion. Partially empty sella. Vascular: Major arterial flow voids are maintained at the skull base. Skull and upper cervical spine: Normal marrow signal. Sinuses/Orbits: Clear sinuses.  Unremarkable  orbits. Other: No sizable mastoid effusions. MR VENOGRAM WITHOUT CONTRAST No evidence of dural venous sinus thrombosis. The superior sagittal sinus, transverse sinuses, and sigmoid sinuses are patent. Bilateral visualized upper internal jugular veins are patent. The visualized deep cerebral veins are patent. Stenosis of the distal transverse sinuses bilaterally. IMPRESSION: 1. No evidence of acute intracranial abnormality. 2. No evidence of dural venous sinus thrombosis. 3. Partially empty sella none and narrowing of the distal transverse sinuses bilaterally. These findings could be anatomic variants, but can be seen with idiopathic intracranial hypertension in the correct clinical setting. Electronically Signed   By: Feliberto Harts M.D.   On: 01/14/2021 18:22   MR MRV HEAD WO CM  Result Date: 01/14/2021 CLINICAL DATA:  Dizziness, non-specific Headache, new or worsening, pregnant; Dural venous sinus thrombosis suspected EXAM: MRI HEAD WITHOUT CONTRAST MRV HEAD WITHOUT CONTRAST TECHNIQUE: Multiplanar, multi-echo pulse sequences of the brain and surrounding structures were acquired without intravenous contrast. Angiographic images of the intracranial venous structures were acquired using MRV technique without intravenous contrast. COMPARISON:  Same day CT head. FINDINGS: MRI HEAD WITHOUT CONTRAST Brain: No acute infarction, hemorrhage, hydrocephalus, extra-axial collection or mass lesion. Partially empty sella. Vascular: Major arterial flow voids are maintained at the skull base. Skull and upper cervical spine: Normal marrow signal. Sinuses/Orbits: Clear sinuses.  Unremarkable orbits. Other: No sizable mastoid effusions. MR VENOGRAM WITHOUT CONTRAST No evidence of dural venous sinus thrombosis. The superior sagittal sinus, transverse sinuses, and sigmoid sinuses are patent. Bilateral visualized upper internal jugular veins are patent. The visualized deep cerebral veins are patent. Stenosis of the distal  transverse sinuses bilaterally. IMPRESSION: 1. No evidence of acute intracranial abnormality. 2. No evidence of dural venous sinus thrombosis. 3. Partially empty sella none and narrowing of the distal transverse sinuses bilaterally. These findings could be anatomic variants, but can be seen with idiopathic intracranial hypertension in the correct clinical setting. Electronically Signed   By: Feliberto Harts M.D.   On: 01/14/2021 18:22     Assessment and Plan  Idiopathic intracranial hypertension  - Normal CT and MRI and MRV results discussed with patient - Follow-up needed with [facility] -- ordered placed>message sent to [facility] to facilitate - Information provided on Idiopathic intracranial hypertension   Headache in pregnancy, antepartum, first trimester  - Rx for Percocet 5/325 mg 1 tablet every 6 hours prn  [redacted] weeks gestation of pregnancy   - Discharge patient - Keep scheduled appt with [facility] on 01/20/2021 - Patient verbalized an understanding of the plan of care and agrees.    Raelyn Mora, CNM 01/14/2021, 10:57 AM

## 2021-01-14 NOTE — MAU Note (Addendum)
Severe HA, feeling dizzy, took her medication for it around 8, helps a little than comes back.  Has been having the headaches for the last 5 days, getting worse.  Has been throwing up.

## 2021-01-16 ENCOUNTER — Other Ambulatory Visit: Payer: Self-pay

## 2021-01-16 ENCOUNTER — Inpatient Hospital Stay (HOSPITAL_COMMUNITY)
Admission: AD | Admit: 2021-01-16 | Discharge: 2021-01-18 | DRG: 832 | Disposition: A | Discharge status: Home / Self Care | Payer: Medicaid Other | Attending: Family Medicine | Admitting: Family Medicine

## 2021-01-16 ENCOUNTER — Encounter (HOSPITAL_COMMUNITY): Payer: Self-pay | Admitting: Family Medicine

## 2021-01-16 ENCOUNTER — Inpatient Hospital Stay (HOSPITAL_COMMUNITY): Payer: Medicaid Other

## 2021-01-16 DIAGNOSIS — Z87891 Personal history of nicotine dependence: Secondary | ICD-10-CM

## 2021-01-16 DIAGNOSIS — R519 Headache, unspecified: Secondary | ICD-10-CM | POA: Diagnosis present

## 2021-01-16 DIAGNOSIS — Z3A10 10 weeks gestation of pregnancy: Secondary | ICD-10-CM

## 2021-01-16 DIAGNOSIS — O99511 Diseases of the respiratory system complicating pregnancy, first trimester: Principal | ICD-10-CM | POA: Diagnosis present

## 2021-01-16 DIAGNOSIS — G8929 Other chronic pain: Secondary | ICD-10-CM | POA: Diagnosis not present

## 2021-01-16 DIAGNOSIS — J101 Influenza due to other identified influenza virus with other respiratory manifestations: Secondary | ICD-10-CM

## 2021-01-16 DIAGNOSIS — B20 Human immunodeficiency virus [HIV] disease: Secondary | ICD-10-CM | POA: Diagnosis present

## 2021-01-16 DIAGNOSIS — Z20822 Contact with and (suspected) exposure to covid-19: Secondary | ICD-10-CM | POA: Diagnosis present

## 2021-01-16 DIAGNOSIS — O0990 Supervision of high risk pregnancy, unspecified, unspecified trimester: Secondary | ICD-10-CM

## 2021-01-16 DIAGNOSIS — I10 Essential (primary) hypertension: Secondary | ICD-10-CM | POA: Diagnosis present

## 2021-01-16 DIAGNOSIS — O98711 Human immunodeficiency virus [HIV] disease complicating pregnancy, first trimester: Secondary | ICD-10-CM | POA: Diagnosis present

## 2021-01-16 DIAGNOSIS — O10011 Pre-existing essential hypertension complicating pregnancy, first trimester: Secondary | ICD-10-CM | POA: Diagnosis present

## 2021-01-16 DIAGNOSIS — G932 Benign intracranial hypertension: Secondary | ICD-10-CM

## 2021-01-16 LAB — CBC
HCT: 31.6 % — ABNORMAL LOW (ref 36.0–46.0)
Hemoglobin: 11.1 g/dL — ABNORMAL LOW (ref 12.0–15.0)
MCH: 28 pg (ref 26.0–34.0)
MCHC: 35.1 g/dL (ref 30.0–36.0)
MCV: 79.8 fL — ABNORMAL LOW (ref 80.0–100.0)
Platelets: 186 10*3/uL (ref 150–400)
RBC: 3.96 MIL/uL (ref 3.87–5.11)
RDW: 11.7 % (ref 11.5–15.5)
WBC: 3.2 10*3/uL — ABNORMAL LOW (ref 4.0–10.5)
nRBC: 0 % (ref 0.0–0.2)

## 2021-01-16 LAB — URINALYSIS, ROUTINE W REFLEX MICROSCOPIC
Bilirubin Urine: NEGATIVE
Glucose, UA: NEGATIVE mg/dL
Hgb urine dipstick: NEGATIVE
Ketones, ur: NEGATIVE mg/dL
Leukocytes,Ua: NEGATIVE
Nitrite: NEGATIVE
Protein, ur: NEGATIVE mg/dL
Specific Gravity, Urine: 1.02 (ref 1.005–1.030)
pH: 6 (ref 5.0–8.0)

## 2021-01-16 LAB — TYPE AND SCREEN
ABO/RH(D): A POS
Antibody Screen: NEGATIVE

## 2021-01-16 LAB — DIFFERENTIAL
Abs Immature Granulocytes: 0.01 10*3/uL (ref 0.00–0.07)
Basophils Absolute: 0 10*3/uL (ref 0.0–0.1)
Basophils Relative: 1 %
Eosinophils Absolute: 0 10*3/uL (ref 0.0–0.5)
Eosinophils Relative: 0 %
Immature Granulocytes: 0 %
Lymphocytes Relative: 54 %
Lymphs Abs: 1.7 10*3/uL (ref 0.7–4.0)
Monocytes Absolute: 0.4 10*3/uL (ref 0.1–1.0)
Monocytes Relative: 12 %
Neutro Abs: 1 10*3/uL — ABNORMAL LOW (ref 1.7–7.7)
Neutrophils Relative %: 33 %

## 2021-01-16 LAB — RESP PANEL BY RT-PCR (FLU A&B, COVID) ARPGX2
Influenza A by PCR: POSITIVE — AB
Influenza B by PCR: NEGATIVE
SARS Coronavirus 2 by RT PCR: NEGATIVE

## 2021-01-16 LAB — HEPATITIS B SURFACE ANTIGEN: Hepatitis B Surface Ag: NONREACTIVE

## 2021-01-16 IMAGING — MR MR MRA HEAD W/O CM
1 series · 19 of 48 positions shown · non-contrast
Comparison: Prior study from [DATE]

CLINICAL DATA: Initial evaluation for cerebral aneurysm screening.

EXAM:
MRA HEAD WITHOUT CONTRAST
TECHNIQUE: Angiographic images of the Circle of Willis were acquired using MRA
technique without intravenous contrast.

[Series 5: 3d cow · axial · 0.5mm · 0.41mm/px · z∈[-136,-58]mm · 19 of 172 slices shown]
[im 1/172]
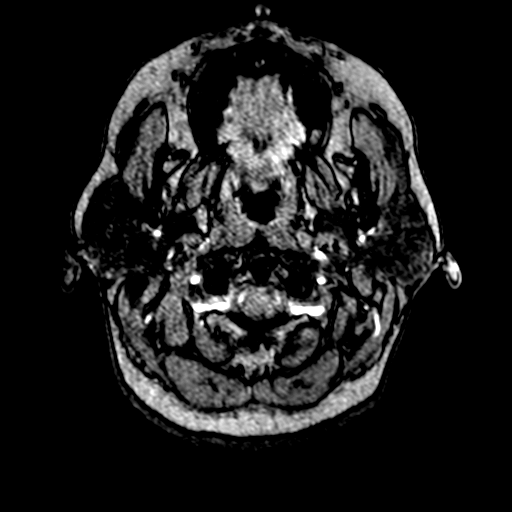
[im 4/172]
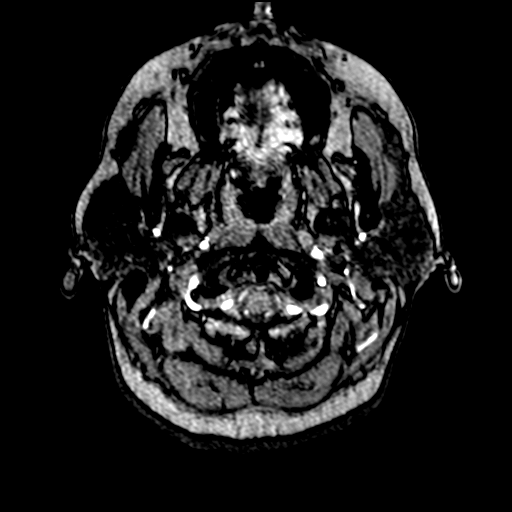
[im 8/172]
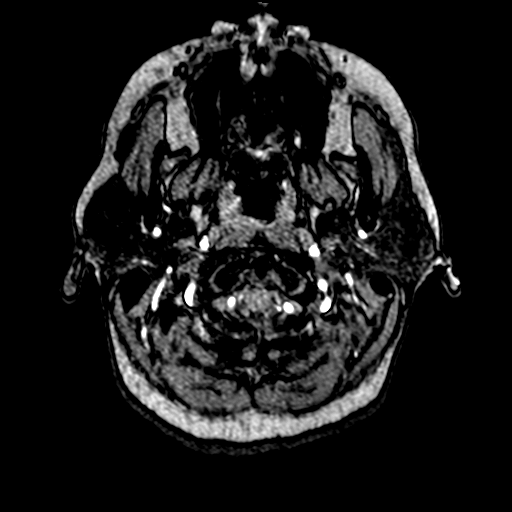
[im 11/172]
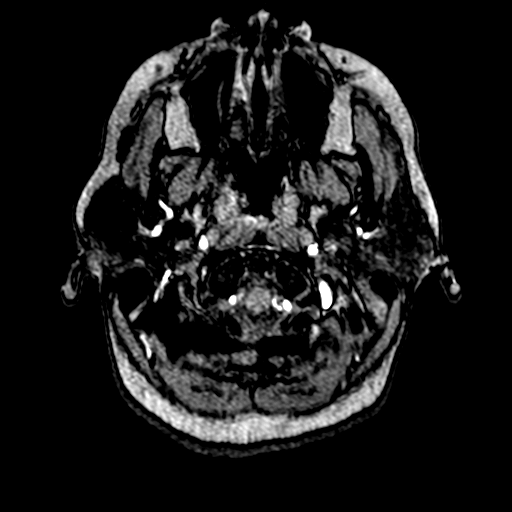
[im 15/172]
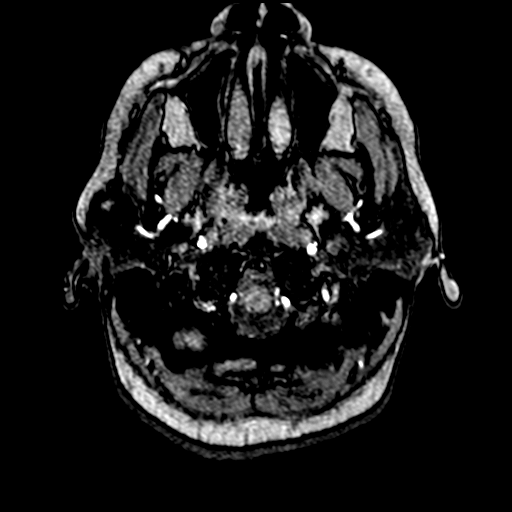
[im 19/172]
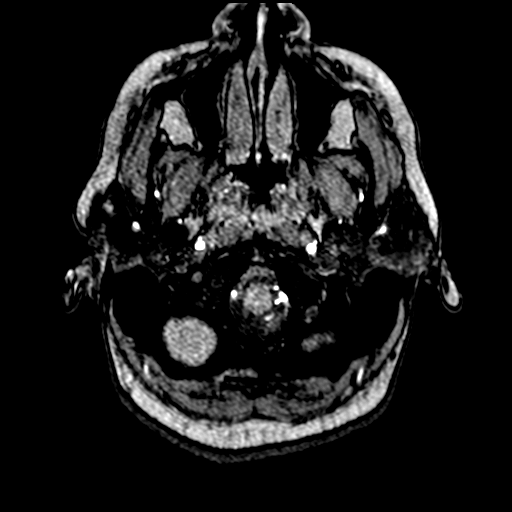
[im 22/172]
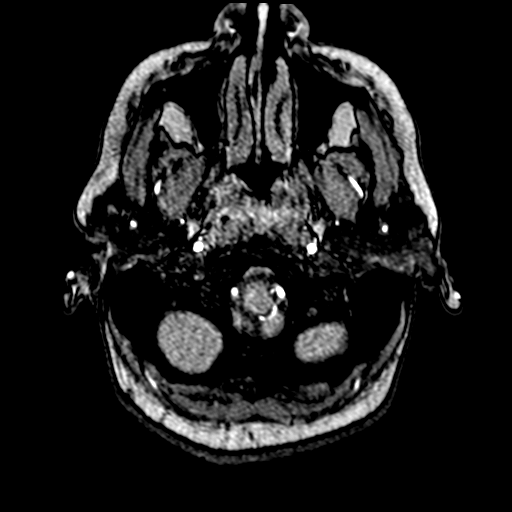
[im 26/172]
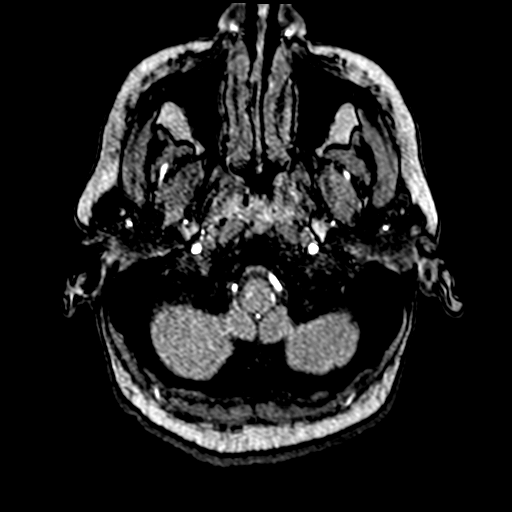
[im 30/172]
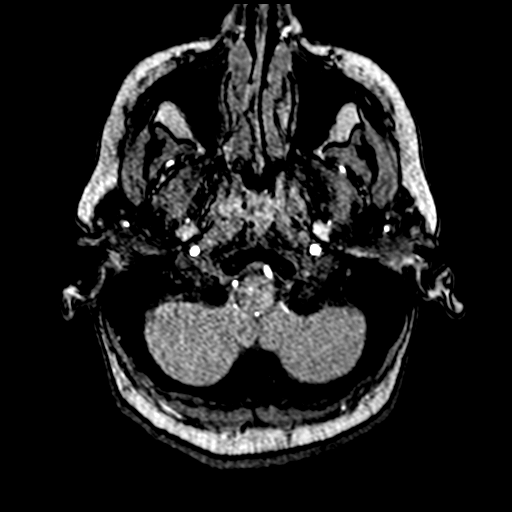
[im 33/172]
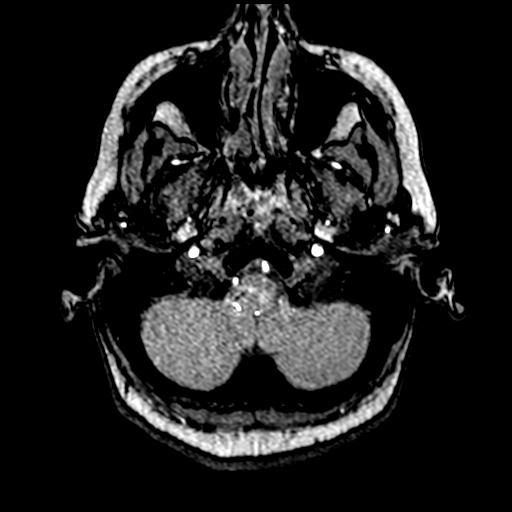
[im 37/172]
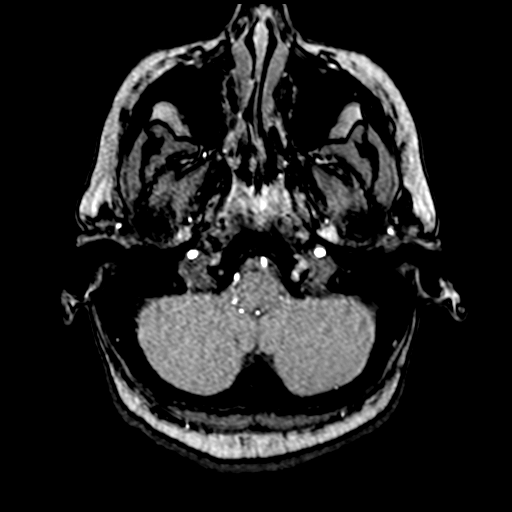
[im 55/172]
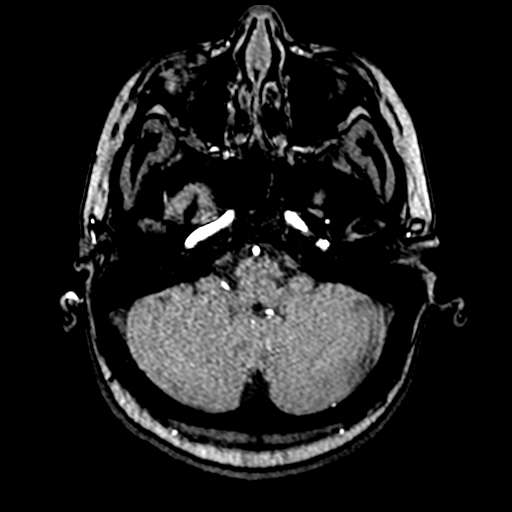
[im 77/172]
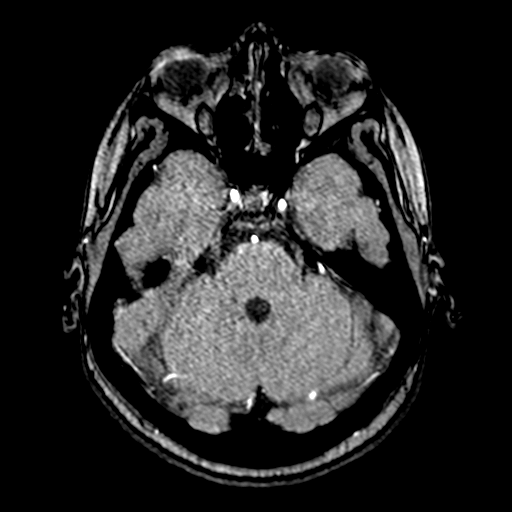
[im 88/172]
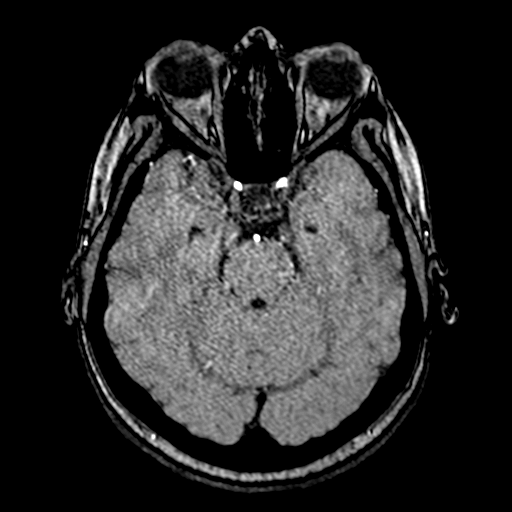
[im 99/172]
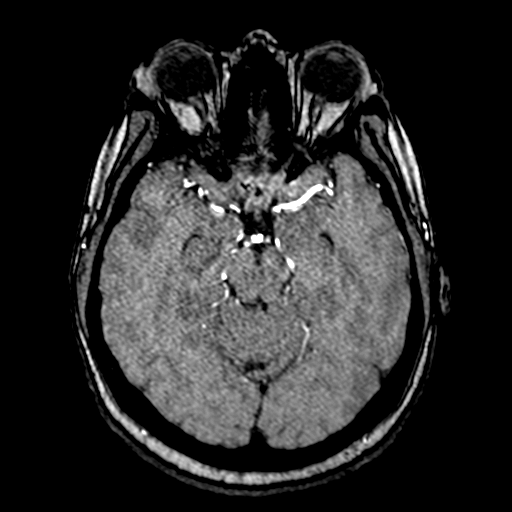
[im 121/172]
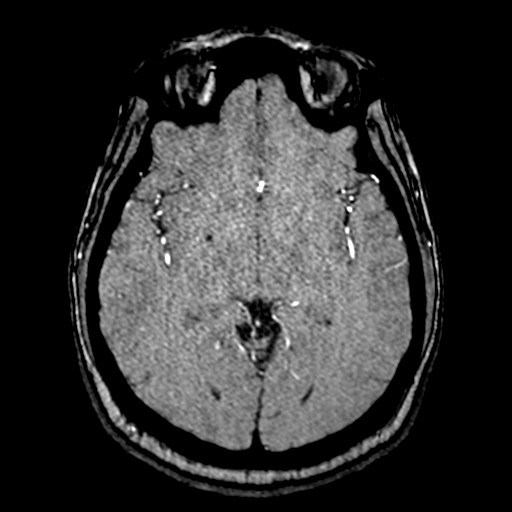
[im 142/172]
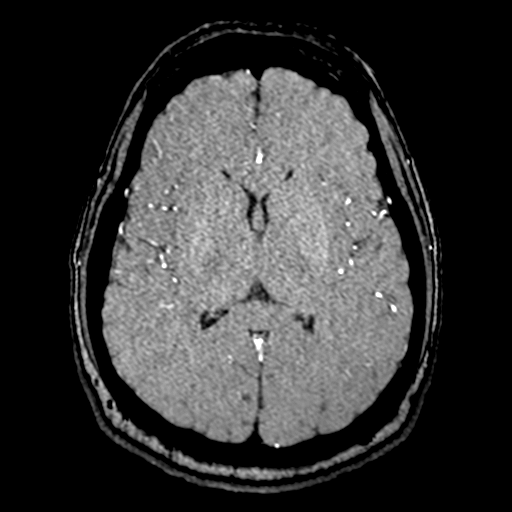
[im 146/172]
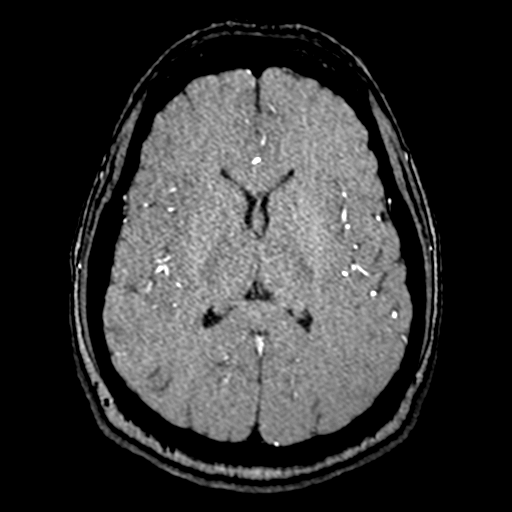
[im 164/172]
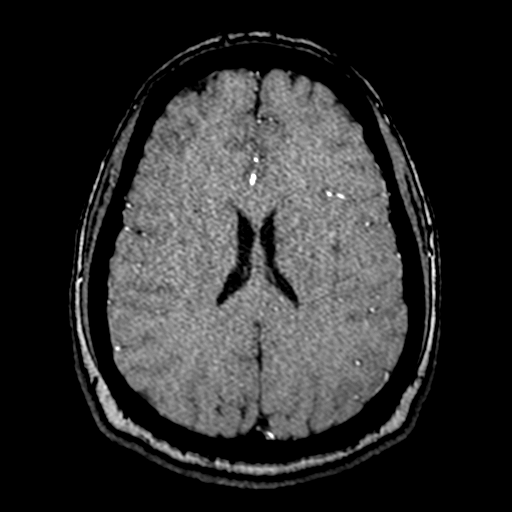

[19 of 48 positions shown; findings below may reference images not displayed]

FINDINGS: Anterior circulation: Examination mildly degraded by motion
artifact.

Visualized distal cervical segments of both internal carotid
arteries are widely patent with antegrade flow. Petrous, cavernous,
and supraclinoid segments patent without stenosis or other
abnormality. A1 segments widely patent. Normal anterior
communicating artery complex. Anterior cerebral arteries patent
without stenosis. No M1 stenosis or occlusion. Normal MCA
bifurcations. Distal MCA branches perfused and symmetric.

Posterior circulation: Both vertebral arteries widely patent to the
vertebrobasilar junction without stenosis. Both PICA origins patent
and normal. Basilar widely patent to its distal aspect without
stenosis. Superior cerebellar arteries patent bilaterally. Both PCAs
primarily supplied via the basilar well perfused or distal aspects.

Anatomic variants: None significant.

Other: No intracranial aneurysm or other vascular abnormality.
IMPRESSION: Normal intracranial MRA. No aneurysm or other vascular abnormality.

## 2021-01-16 MED ORDER — CALCIUM CARBONATE ANTACID 500 MG PO CHEW: 2.0000 | CHEWABLE_TABLET | ORAL | Status: DC | PRN

## 2021-01-16 MED ORDER — LACTATED RINGERS IV SOLN: INTRAVENOUS | Status: DC

## 2021-01-16 MED ORDER — LORAZEPAM 1 MG PO TABS
1.0000 mg | ORAL_TABLET | Freq: Once | ORAL | Status: AC
  Administered 2021-01-16: 1 mg via ORAL
  Filled 2021-01-16: qty 1

## 2021-01-16 MED ORDER — DOCUSATE SODIUM 100 MG PO CAPS
100.0000 mg | ORAL_CAPSULE | Freq: Every day | ORAL | Status: DC
  Administered 2021-01-18: 100 mg via ORAL
  Filled 2021-01-16: qty 1

## 2021-01-16 MED ORDER — ONDANSETRON 4 MG PO TBDP
8.0000 mg | ORAL_TABLET | Freq: Once | ORAL | Status: AC
  Administered 2021-01-16: 8 mg via ORAL
  Filled 2021-01-16: qty 2

## 2021-01-16 MED ORDER — ACETAMINOPHEN 325 MG PO TABS
650.0000 mg | ORAL_TABLET | ORAL | Status: DC | PRN
  Administered 2021-01-17 – 2021-01-18 (×2): 650 mg via ORAL
  Filled 2021-01-16 (×2): qty 2

## 2021-01-16 MED ORDER — ZOLPIDEM TARTRATE 5 MG PO TABS: 5.0000 mg | ORAL_TABLET | Freq: Every evening | ORAL | Status: DC | PRN

## 2021-01-16 MED ORDER — PRENATAL MULTIVITAMIN CH: 1.0000 | ORAL_TABLET | Freq: Every day | ORAL | Status: DC

## 2021-01-16 MED ORDER — CYCLOBENZAPRINE HCL 10 MG PO TABS
10.0000 mg | ORAL_TABLET | Freq: Three times a day (TID) | ORAL | Status: DC | PRN
  Administered 2021-01-16 – 2021-01-18 (×5): 10 mg via ORAL
  Filled 2021-01-16 (×5): qty 1

## 2021-01-16 MED ORDER — LORAZEPAM 0.5 MG PO TABS: 1.0000 mg | ORAL_TABLET | Freq: Four times a day (QID) | ORAL | Status: DC | PRN

## 2021-01-16 NOTE — MAU Note (Signed)
Catherine Simmons is a 35 y.o. at [redacted]w[redacted]d here in MAU reporting: headache since Thanksgiving. Was seen the other day and was sent home with percocet. It helped a little bit but now she is out. Also having right sided pain and is feeling dizzy. No vaginal bleeding or discharge.  Onset of complaint: ongoing  Pain score: headache 8/10, right sided pain 7/10  Vitals:   01/16/21 1433  BP: 140/88  Pulse: 92  Resp: 18  Temp: 98.3 F (36.8 C)  SpO2: 100%     Lab orders placed from triage: UA

## 2021-01-16 NOTE — Consult Note (Signed)
Neurology Consultation  Reason for Consult: Headache, suspected idiopathic intracranial hypertension Referring Physician: Dr. Shawnie Pons  CC: Headache  History is obtained from: Patient, Chart review   HPI: Catherine Simmons is a 35 y.o. female who is [redacted] weeks pregnant with a medical history significant for cholecystitis, depression, HIV, PID, hypertension, and anemia who presented to [facility] 12/8 for evaluation of ongoing constant headache with recent visit on 12/6 for the same complaint. She states that her headache feels like "contractions" in her brain with a throbbing sensation. She has associated nausea, photophobia, and phonophobia and states that she sometimes has visual disturbances as well described as "seeing spots". The headache is worse with laying flat, coughing, and sneezing. She states that the headache is worse in the mornings and while laying flat. On arrival, she had a CT head scan revealing a partially empty sella concerning for possible idiopathic intracranial hypertension and neurology was consulted for further evaluation.   Of note, patient states that she does have a recent history of heavy drinking for approximately one year. She states that she drank approximately 12 ounces of tequila daily until she began to have liver trouble and hematemesis. She states that around 7 months ago she began to drink less but was told not to completely stop drinking to avoid alcohol withdrawal. She states that she stopped drinking completely approximately one month ago.   ROS: A complete ROS was performed and is negative except as noted in the HPI.   Past Medical History:  Diagnosis Date   Anemia    Cholecystitis 07/16/2010   Depression    Genital warts 2004   Headache    Hemorrhoids    HIV (human immunodeficiency virus infection) (HCC)    Hx of pelvic inflammatory disease 08/31/2013   And hx of multiple STDs    Hypertension    Ovarian cyst    Pregnancy induced hypertension    Sickle cell  trait (HCC)    UTI (urinary tract infection)    Past Surgical History:  Procedure Laterality Date   CHOLECYSTECTOMY  07/19/2010   EXTERNAL CEPHALIC VERSION  12/11/2016       WISDOM TOOTH EXTRACTION     Family History  Problem Relation Age of Onset   Cancer Mother    Hypertension Mother    Diabetes Mother    Pancreatitis Mother    Hypertension Father    Diabetes Maternal Aunt    Social History:   reports that she has quit smoking. Her smoking use included cigarettes. She started smoking about 8 years ago. She has never used smokeless tobacco. She reports that she does not currently use alcohol. She reports that she does not currently use drugs after having used the following drugs: Marijuana. Frequency: 5.00 times per week.  Medications No current facility-administered medications for this encounter.  Exam: Current vital signs: BP 121/78   Pulse 68   Temp 98.3 F (36.8 C) (Oral)   Resp 18   LMP 09/09/2020 Comment: had 2 days of bleeding in Sept  SpO2 100% Comment: room air Vital signs in last 24 hours: Temp:  [98.3 F (36.8 C)] 98.3 F (36.8 C) (12/08 1433) Pulse Rate:  [68-92] 68 (12/08 1659) Resp:  [18] 18 (12/08 1433) BP: (121-140)/(78-88) 121/78 (12/08 1659) SpO2:  [100 %] 100 % (12/08 1433)  GENERAL: Awake, alert, appears uncomfortable laying in a dark room  Psych: Affect appropriate for situation, patient is calm and cooperative with examination Head: Normocephalic and atraumatic, without obvious abnormality EENT:  Normal conjunctivae, dry mucous membranes, no OP obstruction LUNGS: Normal respiratory effort. Non-labored breathing on room air CV: Regular rate and rhythm on telemetry ABDOMEN: Soft, non-tender, non-distended Extremities: warm, well perfused, without obvious deformity  NEURO:  Mental Status: Awake, alert, and oriented to person, place, time, and situation. She is able to provide a clear and coherent history of present illness. Speech/Language:  speech is intact without dysarthria.   Naming, repetition, fluency, and comprehension intact without aphasia No neglect is noted Cranial Nerves:  II: PERRL. Visual fields full.  III, IV, VI: EOMI without ptosis.  V: Sensation is intact to light touch and symmetrical to face.  VII: Face is symmetric resting and smiling.  VIII: Hearing is intact to voice IX, X: Palate elevation is symmetric. Phonation normal.  XI: Normal sternocleidomastoid and trapezius muscle strength XII: Tongue protrudes midline without fasciculations.   Motor: 5/5 strength is all muscle groups. Bilateral upper extremities elevate antigravity, however, she has minimal pronator drift of the right upper extremity.   Tone is normal. Bulk is normal.  Sensation: Intact to light touch bilaterally in all four extremities.  Coordination: FTN intact bilaterally. HKS intact bilaterally.  DTRs: 2+ and symmetric throughout.  Gait: Deferred  Labs I have reviewed labs in epic and the results pertinent to this consultation are: CBC    Component Value Date/Time   WBC 3.5 (L) 01/14/2021 1059   RBC 4.22 01/14/2021 1059   HGB 11.6 (L) 01/14/2021 1059   HGB 10.4 (L) 12/30/2017 1112   HGB 8.9 03/20/2014 0000   HCT 34.3 (L) 01/14/2021 1059   HCT 33.5 (L) 12/30/2017 1112   HCT 27 03/20/2014 0000   PLT 201 01/14/2021 1059   PLT 271 12/30/2017 1112   MCV 81.3 01/14/2021 1059   MCV 75 (L) 12/30/2017 1112   MCH 27.5 01/14/2021 1059   MCHC 33.8 01/14/2021 1059   RDW 11.9 01/14/2021 1059   RDW 14.4 12/30/2017 1112   LYMPHSABS 2,262 08/29/2020 1002   MONOABS 0.5 11/02/2018 0155   EOSABS 110 08/29/2020 1002   BASOSABS 29 08/29/2020 1002   CMP     Component Value Date/Time   NA 133 (L) 01/14/2021 1059   NA 138 01/27/2017 1444   K 3.8 01/14/2021 1059   CL 103 01/14/2021 1059   CO2 22 01/14/2021 1059   GLUCOSE 95 01/14/2021 1059   BUN 5 (L) 01/14/2021 1059   BUN 17 01/27/2017 1444   CREATININE 0.48 01/14/2021 1059    CREATININE 0.76 08/29/2020 1002   CALCIUM 8.7 (L) 01/14/2021 1059   PROT 6.4 (L) 01/14/2021 1059   PROT 5.9 (L) 10/08/2016 0939   ALBUMIN 3.2 (L) 01/14/2021 1059   ALBUMIN 3.2 (L) 10/08/2016 0939   AST 12 (L) 01/14/2021 1059   ALT 12 01/14/2021 1059   ALKPHOS 56 01/14/2021 1059   BILITOT 0.3 01/14/2021 1059   BILITOT <0.2 10/08/2016 0939   GFRNONAA >60 01/14/2021 1059   GFRNONAA 121 04/27/2018 1433   GFRAA >60 01/31/2019 1059   GFRAA 140 04/27/2018 1433   Lipid Panel     Component Value Date/Time   CHOL 196 08/29/2020 1002   CHOL 237 12/27/2013 0000   TRIG 60 08/29/2020 1002   HDL 84 08/29/2020 1002   CHOLHDL 2.3 08/29/2020 1002   VLDL 12 10/20/2012 1057   LDLCALC 97 08/29/2020 1002   Imaging I have reviewed the images obtained:  CT-scan of the brain 12/6: 1. No evidence of acute intracranial abnormality. 2. Partially empty sella,  which is often a normal anatomic variant but can be associated with idiopathic intracranial hypertension.  MRI examination of the brain with MRV head 12/6: 1. No evidence of acute intracranial abnormality. 2. No evidence of dural venous sinus thrombosis. 3. Partially empty sella none and narrowing of the distal transverse sinuses bilaterally. These findings could be anatomic variants, but can be seen with idiopathic intracranial hypertension in the correct clinical setting.  Assessment: 35 year old female that is [redacted] weeks pregnant who presented to [facility] today for evaluation of ongoing headache with features concerning for IIH.  - Examination reveals patient that appears uncomfortable with constant, throbbing headache that is worse with laying flat, coughing, and sneezing and that is worse when waking up in the mornings with associated photophobia and phonophobia, nausea, and visual disturbances with seeing spots. - CT imaging revealed a partially empty sella and narrowing of the distal transverse sinuses bilaterally that can be seen with idiopathic  intracranial hypertension.  - Presentation is concerning for IIH. Will attempt bedside LP to measure opening pressure for further evaluation and diagnostics.   Recommendations: - Attempt bedside LP with opening pressure, cell count with differential, protein, glucose, gram stain, and culture - Ativan for LP tolerance, zofran for nausea - Plan discussed with MD at bedside - Family history of Aneurysm in mother and sister, would recommend MR Angio head w/o contrast. - Recommend ophthalmology evaluation for the noted BL blurred vision, specifically to evaluate for optic disc edema. - Further recs pending LP.  Lanae Boast, AGAC-NP [facility] Pager: [phone]   NEUROHOSPITALIST ADDENDUM Performed a face to face diagnostic evaluation.   I have reviewed the contents of history and physical exam as documented by PA/ARNP/Resident and agree with above documentation.  I have discussed and formulated the above plan as documented. Edits to the note have been made as needed.  Impression/Key exam findings/Plan: Obese [redacted] wks pregnant female with headache with features of high pressure. Headache worsens with cough, lying flat, sneezing, bending over. MRI with partially empty sella, MRV with narrowing of distal transverse sinuses bilaterally but no sinus venous thrombosis. Vision is intact but reports mild blurriness.   Also family history of aneurysm in her grand dad, mother and sister.  Will attempt LP at bedside to measure opening pressure + basic csf studies to evaluate for xanthochromia. CTH is negative for hemorrhage. Will do high volume tap and see if that helps with headaches. Will also get MR Angio head to evaluate for aneurysms.  Erick Blinks, MD [facility] [phone]   If 7pm to 7am, please call on call as listed on AMION.

## 2021-01-16 NOTE — H&P (Addendum)
FACULTY PRACTICE ANTEPARTUM ADMISSION HISTORY AND PHYSICAL NOTE   History of Present Illness: Catherine Simmons is a 35 y.o. at [redacted]w[redacted]d by 8wk [place] with PMHx notable for well controlled HIV, cHTN on labetalol, NAFLD, who presents for ongoing headache and worsening RLQ pain.    #Headache Patient seen in MAU two days prior, records reviewed extensively Patient presented with severe headache and reporting hx of aneurysms in several nuclear family members Had extensive workup and curbside with Neurology, CT head, MRI, MRV were unremarkable with exception of some findings that could be consistent with pseudotumor cerebrii She was given numerous medications with only mild improvement in her headache and discharged home Today she reports that headache has not improved at all and has in fact worsened Headache is constant, across the top of her head She also endorses feeling like her vision is getting blurry which is new compared to two days prior Feels like she has been tripping over her feet as well which is also new Endorses nausea intermittently She does not know why she has been continuing to have headaches Feels like the few percocets she was prescribed only take the edge off the pain but do not completely alleviate it   #RLQ pain Reports she had it a few days ago but it was not this bad Endorses intermittent nausea Denies constipation, diarrhea, vaginal bleeding or discharge, burning or pain with urination, history of kidney stones Denies any fevers though has been feeling chills intermittently Says they are not contractions but feels like her brain is being tricked by the sensation into thinking they are No vaginal loss of fluid    Patient Active Problem List   Diagnosis Date Noted   Supervision of high risk pregnancy, antepartum 01/08/2021   Vaginal discomfort 09/30/2020   Menorrhagia with irregular cycle 09/30/2020   Abdominal pain, left lower quadrant 06/20/2020   Fatty liver  06/20/2020   Amenorrhea 11/09/2019   Iron deficiency anemia 05/13/2018   Fatigue 01/03/2018   HIV affecting pregnancy, antepartum 08/05/2016   Sickle cell trait (HCC) 07/22/2016   BMI 40.0-44.9, adult (HCC) 07/22/2016   Hidradenitis suppurativa 05/13/2016   Marijuana use 12/26/2014   Chronic hypertension 03/19/2014   Anxiety 06/30/2013   Human immunodeficiency virus (HIV) disease (HCC) 10/12/2012   CARPAL TUNNEL SYNDROME 11/14/2009   Adjustment disorder with depressed mood 05/09/2008    Past Medical History:  Diagnosis Date   Anemia    Cholecystitis 07/16/2010   Depression    Genital warts 2004   Headache    Hemorrhoids    HIV (human immunodeficiency virus infection) (HCC)    Hx of pelvic inflammatory disease 08/31/2013   And hx of multiple STDs    Hypertension    Ovarian cyst    Pregnancy induced hypertension    Sickle cell trait (HCC)    UTI (urinary tract infection)     Past Surgical History:  Procedure Laterality Date   CHOLECYSTECTOMY  07/19/2010   EXTERNAL CEPHALIC VERSION  12/11/2016       WISDOM TOOTH EXTRACTION      OB History  Gravida Para Term Preterm AB Living  7 5 4 1 1 5   SAB IAB Ectopic Multiple Live Births    1   0 5    # Outcome Date GA Lbr Len/2nd Weight Sex Delivery Anes PTL Lv  7 Current           6 IAB 02/2018          5 Preterm  12/18/16 [redacted]w[redacted]d   F Vag-Spont     4 Term 07/06/15 [redacted]w[redacted]d 04:15 / 00:05 2639 g M Vag-Spont None  LIV  3 Term 03/20/14 [redacted]w[redacted]d 02:01 / 02:04 3360 g M Vag-Spont EPI  LIV  2 Term 02/12/10 [redacted]w[redacted]d  3289 g M Vag-Spont EPI N LIV  1 Term 06/02/04 [redacted]w[redacted]d  3204 g F Vag-Spont EPI Y LIV    Social History   Socioeconomic History   Marital status: Single    Spouse name: Not on file   Number of children: Not on file   Years of education: Not on file   Highest education level: Not on file  Occupational History   Not on file  Tobacco Use   Smoking status: Former    Years: 15.00    Types: Cigarettes    Start date: 03/11/2012    Smokeless tobacco: Never  Vaping Use   Vaping Use: Never used  Substance and Sexual Activity   Alcohol use: Not Currently    Comment: occ   Drug use: Not Currently    Frequency: 5.0 times per week    Types: Marijuana    Comment: occ   Sexual activity: Not Currently    Partners: Male    Birth control/protection: None  Other Topics Concern   Not on file  Social History Narrative   Not on file   Social Determinants of Health   Financial Resource Strain: Not on file  Food Insecurity: Not on file  Transportation Needs: Not on file  Physical Activity: Not on file  Stress: Not on file  Social Connections: Not on file    Family History  Problem Relation Age of Onset   Cancer Mother    Hypertension Mother    Diabetes Mother    Pancreatitis Mother    Hypertension Father    Diabetes Maternal Aunt     Allergies  Allergen Reactions   Butorphanol Itching    Tolerates percocet   Stadol [Butorphanol Tartrate] Itching    Tolerates percocet    Medications Prior to Admission  Medication Sig Dispense Refill Last Dose   abacavir-dolutegravir-lamiVUDine (TRIUMEQ) 600-50-300 MG tablet Take 1 tablet by mouth daily. 30 tablet 0 01/15/2021   cyclobenzaprine (FLEXERIL) 5 MG tablet Take 1 tablet (5 mg total) by mouth 3 (three) times daily as needed for muscle spasms (or headache). 30 tablet 0 01/15/2021   metoCLOPramide (REGLAN) 10 MG tablet Take 1 tablet (10 mg total) by mouth every 8 (eight) hours as needed for nausea (or headache). 30 tablet 0 01/16/2021   labetalol (NORMODYNE) 200 MG tablet Take 1 tablet (200 mg total) by mouth 2 (two) times daily. (Patient not taking: Reported on 01/08/2021) 60 tablet 0    oxyCODONE-acetaminophen (PERCOCET/ROXICET) 5-325 MG tablet Take 1 tablet by mouth every 6 (six) hours as needed for severe pain. 3 tablet 0     ROS: Pertinent positives and negative per HPI, all others reviewed and negative   Vitals:  BP 121/78   Pulse 68   Temp 98.3 F (36.8  C) (Oral)   Resp 18   LMP 09/09/2020 Comment: had 2 days of bleeding in Sept  SpO2 100% Comment: room air Physical Examination: Vitals reviewed.  Constitutional:      General: She is not in acute distress.    Appearance: She is well-developed. She is not diaphoretic.  Eyes:     General: No scleral icterus.    Funduscopic exam:    Right eye: Papilledema present.  Left eye: Papilledema present.  Pulmonary:     Effort: Pulmonary effort is normal. No respiratory distress.  Abdominal:     General: There is no distension.     Palpations: Abdomen is soft.     Tenderness: There is abdominal tenderness in the right lower quadrant. There is no guarding or rebound.  Skin:    General: Skin is warm and dry.  Neurological:     Mental Status: She is alert and oriented to person, place, and time.     Cranial Nerves: Cranial nerves 2-12 are intact. No dysarthria or facial asymmetry.     Sensory: Sensation is intact.     Motor: Pronator drift present. No weakness.     Coordination: Coordination normal. Finger-Nose-Finger Test normal.   Bedside US Documentation Pt informed that the ultrasound is considered a limited OB ultrasound and is not intended to be a complete ultrasound exam.  Patient also informed that the ultrasound is not being completed with the intent of assessing for fetal or placental anomalies or any pelvic abnormalities.  Explained that the purpose of today's ultrasound is to assess for  viability.  Patient acknowledges the purpose of the exam and the limitations of the study.    My read: viable IUP with normal cardiac and somatic motion, subjectively normal fluid  Labs:  Results for orders placed or performed during the hospital encounter of 01/16/21 (from the past 24 hour(s))  Urinalysis, Routine w reflex microscopic Urine, Clean Catch   Collection Time: 01/16/21  2:40 PM  Result Value Ref Range   Color, Urine YELLOW YELLOW   APPearance CLEAR CLEAR   Specific Gravity,  Urine 1.020 1.005 - 1.030   pH 6.0 5.0 - 8.0   Glucose, UA NEGATIVE NEGATIVE mg/dL   Hgb urine dipstick NEGATIVE NEGATIVE   Bilirubin Urine NEGATIVE NEGATIVE   Ketones, ur NEGATIVE NEGATIVE mg/dL   Protein, ur NEGATIVE NEGATIVE mg/dL   Nitrite NEGATIVE NEGATIVE   Leukocytes,Ua NEGATIVE NEGATIVE    Imaging Studies: CT HEAD WO CONTRAST ( )  Result Date: 01/14/2021 CLINICAL DATA:  Headache, new or worsening, pregnant EXAM: CT HEAD WITHOUT CONTRAST TECHNIQUE: Contiguous axial images were obtained from the base of the skull through the vertex without intravenous contrast. COMPARISON:  CT head April 01, 2020. FINDINGS: Brain: No evidence of acute infarction, hemorrhage, hydrocephalus, extra-axial collection or mass lesion/mass effect. Partially empty sella. Vascular: No hyperdense vessel identified. Skull: No evidence of acute fracture. Sinuses/Orbits: Clear sinuses.  Unremarkable orbits. Other: No mastoid effusions. IMPRESSION: 1. No evidence of acute intracranial abnormality. 2. Partially empty sella, which is often a normal anatomic variant but can be associated with idiopathic intracranial hypertension. Electronically Signed   By: Feliberto Harts M.D.   On: 01/14/2021 13:36   MR BRAIN WO CONTRAST  Result Date: 01/14/2021 CLINICAL DATA:  Dizziness, non-specific Headache, new or worsening, pregnant; Dural venous sinus thrombosis suspected EXAM: MRI HEAD WITHOUT CONTRAST MRV HEAD WITHOUT CONTRAST TECHNIQUE: Multiplanar, multi-echo pulse sequences of the brain and surrounding structures were acquired without intravenous contrast. Angiographic images of the intracranial venous structures were acquired using MRV technique without intravenous contrast. COMPARISON:  Same day CT head. FINDINGS: MRI HEAD WITHOUT CONTRAST Brain: No acute infarction, hemorrhage, hydrocephalus, extra-axial collection or mass lesion. Partially empty sella. Vascular: Major arterial flow voids are maintained at the skull  base. Skull and upper cervical spine: Normal marrow signal. Sinuses/Orbits: Clear sinuses.  Unremarkable orbits. Other: No sizable mastoid effusions. MR VENOGRAM WITHOUT CONTRAST No evidence of dural  venous sinus thrombosis. The superior sagittal sinus, transverse sinuses, and sigmoid sinuses are patent. Bilateral visualized upper internal jugular veins are patent. The visualized deep cerebral veins are patent. Stenosis of the distal transverse sinuses bilaterally. IMPRESSION: 1. No evidence of acute intracranial abnormality. 2. No evidence of dural venous sinus thrombosis. 3. Partially empty sella none and narrowing of the distal transverse sinuses bilaterally. These findings could be anatomic variants, but can be seen with idiopathic intracranial hypertension in the correct clinical setting. Electronically Signed   By: Feliberto Harts M.D.   On: 01/14/2021 18:22   US OB Comp Less 14 Wks  Result Date: 12/31/2020 CLINICAL DATA:  Abdominal pain in first trimester of pregnancy, O26.891; LMP 09/09/2020, no current quantitative beta HCG yet available for correlation EXAM: OBSTETRIC <14 WK ULTRASOUND TECHNIQUE: Transabdominal ultrasound was performed for evaluation of the gestation as well as the maternal uterus and adnexal regions. COMPARISON:  None for this gestation FINDINGS: Intrauterine gestational sac: Present, single Yolk sac:  Present Embryo:  Present Cardiac Activity: Present Heart Rate: 166 bpm CRL:   17.6 mm   8 w 1 d                  [place] EDC: 08/11/2021 Subchorionic hemorrhage:  None visualized. Maternal uterus/adnexae: Remainder of uterus unremarkable. LEFT ovary normal size and morphology, 2.0 x 2.2 x 1.4 cm. RIGHT ovary measures 4.2 x 2.7 x 3.2 cm and contains a small corpus luteal cyst. No free pelvic fluid or adnexal masses. IMPRESSION: Single live intrauterine gestation at 8 weeks 1 day EGA by crown-rump length. No acute abnormalities. Electronically Signed   By: [name] M.D.   On: 12/31/2020  11:05   MR MRV HEAD WO CM  Result Date: 01/14/2021 CLINICAL DATA:  Dizziness, non-specific Headache, new or worsening, pregnant; Dural venous sinus thrombosis suspected EXAM: MRI HEAD WITHOUT CONTRAST MRV HEAD WITHOUT CONTRAST TECHNIQUE: Multiplanar, multi-echo pulse sequences of the brain and surrounding structures were acquired without intravenous contrast. Angiographic images of the intracranial venous structures were acquired using MRV technique without intravenous contrast. COMPARISON:  Same day CT head. FINDINGS: MRI HEAD WITHOUT CONTRAST Brain: No acute infarction, hemorrhage, hydrocephalus, extra-axial collection or mass lesion. Partially empty sella. Vascular: Major arterial flow voids are maintained at the skull base. Skull and upper cervical spine: Normal marrow signal. Sinuses/Orbits: Clear sinuses.  Unremarkable orbits. Other: No sizable mastoid effusions. MR VENOGRAM WITHOUT CONTRAST No evidence of dural venous sinus thrombosis. The superior sagittal sinus, transverse sinuses, and sigmoid sinuses are patent. Bilateral visualized upper internal jugular veins are patent. The visualized deep cerebral veins are patent. Stenosis of the distal transverse sinuses bilaterally. IMPRESSION: 1. No evidence of acute intracranial abnormality. 2. No evidence of dural venous sinus thrombosis. 3. Partially empty sella none and narrowing of the distal transverse sinuses bilaterally. These findings could be anatomic variants, but can be seen with idiopathic intracranial hypertension in the correct clinical setting. Electronically Signed   By: Feliberto Harts M.D.   On: 01/14/2021 18:22     Assessment and Plan: Patient Active Problem List   Diagnosis Date Noted   Supervision of high risk pregnancy, antepartum 01/08/2021   Vaginal discomfort 09/30/2020   Menorrhagia with irregular cycle 09/30/2020   Abdominal pain, left lower quadrant 06/20/2020   Fatty liver 06/20/2020   Amenorrhea 11/09/2019   Iron  deficiency anemia 05/13/2018   Fatigue 01/03/2018   HIV affecting pregnancy, antepartum 08/05/2016   Sickle cell trait (HCC) 07/22/2016   BMI  40.0-44.9, adult (HCC) 07/22/2016   Hidradenitis suppurativa 05/13/2016   Marijuana use 12/26/2014   Chronic hypertension 03/19/2014   Anxiety 06/30/2013   Human immunodeficiency virus (HIV) disease (HCC) 10/12/2012   CARPAL TUNNEL SYNDROME 11/14/2009   Adjustment disorder with depressed mood 05/09/2008   Catherine Simmons is a 35 y.o. at [redacted]w[redacted]d by 8wk [place] with PMHx notable for well controlled HIV, cHTN on labetalol, NAFLD, who presents for ongoing headache and worsening RLQ pain.   #Headache Seen by Neurology, see their consult note, differential intracranial hypertension vs migraine.  LP attempted x3 at the bedside by Neurology without success, order already placed for radiology to attempt under fluoro in the AM with note made to do large volume tap if there is a high opening pressure.  NPO at midnight Plan to obtain MRA as well to evaluate for aneurysms Ativan PRN for anxiety Admit to Antenatal, routine care OK to go to med-surg floor if needed  #RLQ pain Somewhat improved, unclear etiology, continue to monitor.  Check CBC, low suspicion for appendicitis   Venora Maples, MD/MPH Attending Family Medicine Physician, [facility] for [facility], [facility]  01/16/21 8:16 PM

## 2021-01-16 NOTE — Procedures (Addendum)
Indication:  Risks of the procedure were dicussed with the patient including post-LP headache, bleeding, infection, weakness/numbness of legs(radiculopathy), headaches.  The patient/patient's proxy agreed and written consent was obtained.   The patient was prepped and draped, and using sterile technique a 20 gauge quinke spinal needle was inserted in the L3-4 and L4-5 space. Unable to obtain and CSF despite 3 attempts.  Will need large volume L under fluoro with opening pressure bt IR under fluoro.  Erick Blinks [facility] Pager Number [phone]

## 2021-01-16 NOTE — MAU Provider Note (Incomplete)
History     [id number]  Arrival date and time: 01/16/21 1419    Chief Complaint  Patient presents with   Abdominal Pain   Headache   Dizziness     HPI Catherine Simmons is a 35 y.o. at [redacted]w[redacted]d by 8wk [place] with PMHx notable for well controlled HIV, cHTN on labetalol, NAFLD, who presents for ongoing headache and worsening RLQ pain.   #Headache Patient seen in MAU two days prior, records reviewed extensively Patient presented with severe headache and reporting hx of aneurysms in several nuclear family members Had extensive workup and curbside with Neurology, CT head, MRI, MRV were unremarkable with exception of some findings that could be consistent with pseudotumor cerebrii She was given numerous medications with only mild improvement in her headache and discharged home Today she reports that headache has not improved at all and has in fact worsened Headache is constant, across the top of her head She also endorses feeling like her vision is getting blurry which is new compared to two days prior Feels like she has been tripping over her feet as well which is also new Endorses nausea intermittently She does not know why she has been continuing to have headaches Feels like the few percocets she was prescribed only take the edge off the pain but do not completely alleviate it  #RLQ pain Reports she had it a few days ago but it was not this bad Endorses intermittent nausea Denies constipation, diarrhea, vaginal bleeding or discharge, burning or pain with urination, history of kidney stones Denies any fevers though has been feeling chills intermittently Says they are not contractions but feels like her brain is being tricked by the sensation into thinking they are No vaginal loss of fluid     OB History     Gravida  7   Para  5   Term  4   Preterm  1   AB  1   Living  5      SAB      IAB  1   Ectopic      Multiple  0   Live Births  5           Past  Medical History:  Diagnosis Date   Anemia    Cholecystitis 07/16/2010   Depression    Genital warts 2004   Headache    Hemorrhoids    HIV (human immunodeficiency virus infection) (HCC)    Hx of pelvic inflammatory disease 08/31/2013   And hx of multiple STDs    Hypertension    Ovarian cyst    Pregnancy induced hypertension    Sickle cell trait (HCC)    UTI (urinary tract infection)     Past Surgical History:  Procedure Laterality Date   CHOLECYSTECTOMY  07/19/2010   EXTERNAL CEPHALIC VERSION  12/11/2016       WISDOM TOOTH EXTRACTION      Family History  Problem Relation Age of Onset   Cancer Mother    Hypertension Mother    Diabetes Mother    Pancreatitis Mother    Hypertension Father    Diabetes Maternal Aunt     Social History   Socioeconomic History   Marital status: Single    Spouse name: Not on file   Number of children: Not on file   Years of education: Not on file   Highest education level: Not on file  Occupational History   Not on file  Tobacco Use   Smoking  status: Former    Years: 15.00    Types: Cigarettes    Start date: 03/11/2012   Smokeless tobacco: Never  Vaping Use   Vaping Use: Never used  Substance and Sexual Activity   Alcohol use: Not Currently    Comment: occ   Drug use: Not Currently    Frequency: 5.0 times per week    Types: Marijuana    Comment: occ   Sexual activity: Not Currently    Partners: Male    Birth control/protection: None  Other Topics Concern   Not on file  Social History Narrative   Not on file   Social Determinants of Health   Financial Resource Strain: Not on file  Food Insecurity: Not on file  Transportation Needs: Not on file  Physical Activity: Not on file  Stress: Not on file  Social Connections: Not on file  Intimate Partner Violence: Not on file    Allergies  Allergen Reactions   Butorphanol Itching    Tolerates percocet   Stadol [Butorphanol Tartrate]  Itching    Tolerates percocet    No current facility-administered medications on file prior to encounter.   Current Outpatient Medications on File Prior to Encounter  Medication Sig Dispense Refill   abacavir-dolutegravir-lamiVUDine (TRIUMEQ) 600-50-300 MG tablet Take 1 tablet by mouth daily. 30 tablet 0   cyclobenzaprine (FLEXERIL) 5 MG tablet Take 1 tablet (5 mg total) by mouth 3 (three) times daily as needed for muscle spasms (or headache). 30 tablet 0   metoCLOPramide (REGLAN) 10 MG tablet Take 1 tablet (10 mg total) by mouth every 8 (eight) hours as needed for nausea (or headache). 30 tablet 0   labetalol (NORMODYNE) 200 MG tablet Take 1 tablet (200 mg total) by mouth 2 (two) times daily. (Patient not taking: Reported on 01/08/2021) 60 tablet 0   oxyCODONE-acetaminophen (PERCOCET/ROXICET) 5-325 MG tablet Take 1 tablet by mouth every 6 (six) hours as needed for severe pain. 3 tablet 0     ROS Pertinent positives and negative per HPI, all others reviewed and negative  Physical Exam   BP 140/88 (BP Location: Right Arm)    Pulse 92    Temp 98.3 F (36.8 C) (Oral)    Resp 18    LMP 09/09/2020 Comment: had 2 days of bleeding in Sept   SpO2 100% Comment: room air  Patient Vitals for the past 24 hrs:  BP Temp Temp src Pulse Resp SpO2  01/16/21 1433 140/88 98.3 F (36.8 C) Oral 92 18 100 %    Physical Exam Vitals reviewed.  Constitutional:      General: She is not in acute distress.    Appearance: She is well-developed. She is not diaphoretic.  Eyes:     General: No scleral icterus.    Funduscopic exam:    Right eye: Papilledema present.        Left eye: Papilledema present.  Pulmonary:     Effort: Pulmonary effort is normal. No respiratory distress.  Abdominal:     General: There is no distension.     Palpations: Abdomen is soft.     Tenderness: There is abdominal tenderness in the right lower quadrant. There is no guarding or rebound.  Skin:    General: Skin is warm  and dry.  Neurological:     Mental Status: She is alert and oriented to person, place, and time.     Cranial Nerves: Cranial nerves 2-12 are intact. No dysarthria or facial asymmetry.  Sensory: Sensation is intact.     Motor: Pronator drift present. No weakness.     Coordination: Coordination normal. Finger-Nose-Finger Test normal.     Cervical Exam    Bedside Ultrasound ***  My interpretation: ***  FHT Baseline ***, *** variability, ***accels, ***decels Toco: *** Cat: ***  Labs No results found for this or any previous visit (from the past 24 hour(s)).  Imaging No results found.  MAU Course  Procedures  Lab Orders         Urinalysis, Routine w reflex microscopic Urine, Clean Catch    No orders of the defined types were placed in this encounter.  Imaging Orders  No imaging studies ordered today    MDM {Desc; none/mild/moderate/severe:13498}  Assessment and Plan  ***  #FWB FHT Cat *** NST: ***  ***add GA to billing    Venora Maples, MD/MPH 01/16/21 3:49 PM  Allergies as of 01/16/2021       Reactions   Butorphanol Itching   Tolerates percocet   Stadol [butorphanol Tartrate] Itching   Tolerates percocet     Med Rec must be completed prior to using this SMART[employer]***

## 2021-01-17 ENCOUNTER — Observation Stay (HOSPITAL_COMMUNITY): Payer: Medicaid Other | Admitting: Anesthesiology

## 2021-01-17 DIAGNOSIS — O98711 Human immunodeficiency virus [HIV] disease complicating pregnancy, first trimester: Secondary | ICD-10-CM | POA: Diagnosis present

## 2021-01-17 DIAGNOSIS — Z3A10 10 weeks gestation of pregnancy: Secondary | ICD-10-CM | POA: Diagnosis not present

## 2021-01-17 DIAGNOSIS — G4489 Other headache syndrome: Secondary | ICD-10-CM | POA: Diagnosis not present

## 2021-01-17 DIAGNOSIS — J101 Influenza due to other identified influenza virus with other respiratory manifestations: Secondary | ICD-10-CM

## 2021-01-17 DIAGNOSIS — B20 Human immunodeficiency virus [HIV] disease: Secondary | ICD-10-CM | POA: Diagnosis present

## 2021-01-17 DIAGNOSIS — O99511 Diseases of the respiratory system complicating pregnancy, first trimester: Secondary | ICD-10-CM | POA: Diagnosis present

## 2021-01-17 DIAGNOSIS — G43001 Migraine without aura, not intractable, with status migrainosus: Secondary | ICD-10-CM

## 2021-01-17 DIAGNOSIS — I10 Essential (primary) hypertension: Secondary | ICD-10-CM

## 2021-01-17 DIAGNOSIS — R519 Headache, unspecified: Secondary | ICD-10-CM | POA: Diagnosis present

## 2021-01-17 DIAGNOSIS — Z87891 Personal history of nicotine dependence: Secondary | ICD-10-CM | POA: Diagnosis not present

## 2021-01-17 DIAGNOSIS — Z20822 Contact with and (suspected) exposure to covid-19: Secondary | ICD-10-CM | POA: Diagnosis present

## 2021-01-17 DIAGNOSIS — O10011 Pre-existing essential hypertension complicating pregnancy, first trimester: Secondary | ICD-10-CM | POA: Diagnosis present

## 2021-01-17 HISTORY — DX: Influenza due to other identified influenza virus with other respiratory manifestations: J10.1

## 2021-01-17 LAB — CSF CELL COUNT WITH DIFFERENTIAL
RBC Count, CSF: 17 /mm3 — ABNORMAL HIGH
Tube #: 2
WBC, CSF: 0 /mm3 (ref 0–5)

## 2021-01-17 LAB — PROTEIN AND GLUCOSE, CSF
Glucose, CSF: 47 mg/dL (ref 40–70)
Total  Protein, CSF: 14 mg/dL — ABNORMAL LOW (ref 15–45)

## 2021-01-17 LAB — RPR: RPR Ser Ql: NONREACTIVE

## 2021-01-17 MED ORDER — DEXAMETHASONE SODIUM PHOSPHATE 10 MG/ML IJ SOLN
10.0000 mg | Freq: Once | INTRAMUSCULAR | Status: AC
Start: 2021-01-17 — End: 2021-01-17
  Administered 2021-01-17: 10 mg via INTRAVENOUS
  Filled 2021-01-17: qty 1

## 2021-01-17 MED ORDER — METOCLOPRAMIDE HCL 10 MG PO TABS: 10.0000 mg | ORAL_TABLET | Freq: Three times a day (TID) | ORAL | Status: DC | PRN

## 2021-01-17 MED ORDER — SODIUM CHLORIDE 0.9 % IV BOLUS
1000.0000 mL | Freq: Once | INTRAVENOUS | Status: AC
  Administered 2021-01-17: 1000 mL via INTRAVENOUS

## 2021-01-17 MED ORDER — FENTANYL CITRATE (PF) 100 MCG/2ML IJ SOLN
100.0000 ug | Freq: Once | INTRAMUSCULAR | Status: AC
  Administered 2021-01-17: 100 ug via INTRAVENOUS

## 2021-01-17 MED ORDER — ABACAVIR-DOLUTEGRAVIR-LAMIVUD 600-50-300 MG PO TABS
1.0000 | ORAL_TABLET | Freq: Every day | ORAL | Status: DC
Start: 2021-01-17 — End: 2021-01-18
  Administered 2021-01-17: 1 via ORAL
  Filled 2021-01-17 (×3): qty 1

## 2021-01-17 MED ORDER — CYCLOBENZAPRINE HCL 10 MG PO TABS: 5.0000 mg | ORAL_TABLET | Freq: Three times a day (TID) | ORAL | Status: DC | PRN

## 2021-01-17 MED ORDER — FENTANYL CITRATE (PF) 100 MCG/2ML IJ SOLN
INTRAMUSCULAR | Status: AC
  Filled 2021-01-17: qty 2

## 2021-01-17 MED ORDER — SODIUM CHLORIDE 0.9 % IV SOLN
25.0000 mg | Freq: Four times a day (QID) | INTRAVENOUS | Status: DC | PRN
  Administered 2021-01-17: 25 mg via INTRAVENOUS
  Filled 2021-01-17 (×2): qty 1

## 2021-01-17 MED ORDER — MELATONIN 3 MG PO TABS
3.0000 mg | ORAL_TABLET | Freq: Every day | ORAL | Status: DC
  Administered 2021-01-17: 3 mg via ORAL
  Filled 2021-01-17: qty 1

## 2021-01-17 MED ORDER — MAGNESIUM SULFATE 2 GM/50ML IV SOLN
2.0000 g | Freq: Once | INTRAVENOUS | Status: AC
  Administered 2021-01-17: 2 g via INTRAVENOUS
  Filled 2021-01-17: qty 50

## 2021-01-17 NOTE — Anesthesia Procedure Notes (Signed)
Spinal  Patient location during procedure: OB Start time: 01/17/2021 12:40 PM End time: 01/17/2021 1:00 PM Reason for block: at surgeon's request Staffing Performed: anesthesiologist  Anesthesiologist: Lannie Fields, DO Preanesthetic Checklist Completed: patient identified, IV checked, risks and benefits discussed, surgical consent, monitors and equipment checked, pre-op evaluation and timeout performed Spinal Block Patient position: right lateral decubitus Prep: DuraPrep and site prepped and draped Patient monitoring: continuous pulse ox and blood pressure Approach: midline Location: L3-4 Needle Needle type: Quincke  Needle gauge: 22 G Needle length: 15 cm Assessment Events: CSF return Additional Notes Contacted by OBGYN to assist neurology with lumbar puncture after failed attempts. I performed LP at L3-4 w/ 22G needle (22G or larger required for opening pressure measurements). Neurologist performed opening pressure and collected vials of CSF.   Functioning IV was confirmed and monitors were applied. Sedation given by RN. Sterile prep and drape, including hand hygiene and sterile gloves were used. The patient was positioned and the spine was prepped. The skin was anesthetized with lidocaine. Clear CSF from spinal needle. No issues.

## 2021-01-17 NOTE — Progress Notes (Addendum)
Brief Neuro Update:  LP performed by Anesthesia at bedside. I measured opening pressure. Opening pressure at bedside today in right lateral decubitis position without any straining was 18cm H20. About of CSF collected in 2 vials. Pressure is not consistent with IIH.  Would recommend treating her headache as a migraine headache with migraine cocktail and follow up with Neurology outpatient. I suspect that the exacerbation is probably due to her being positive for influenza A.  CSF studies are pending.  Erick Blinks [facility] Pager Number [phone]

## 2021-01-17 NOTE — Progress Notes (Signed)
Requested by [facility] service Dr. Shawnie Pons to assist neurology w/ lumbar puncture for patient with suspected intracranial hypertension. Consent signed for procedure by patient and witnessed by RN, d/w patient possibility of post-dural puncture headache after procedure as well as risk of bleeding and infection. Preop plt 186, no blood thinners. Fentanyl IV administered pre-procedure- see MAR.   Performed sterilely in right lateral decubitus position with 22G quincke needle at L3-4. Opening pressure measured by neurologist Dr. Derry Lory and 2 vials of CSF collected.   No complications.

## 2021-01-17 NOTE — Progress Notes (Signed)
Patient ID: Catherine Simmons, female   DOB: [birth year], 35 y.o.   MRN: [id number]   Assessment/Plan: Principal Problem:   Headache Active Problems:   Human immunodeficiency virus (HIV) disease (HCC)   Chronic hypertension   Influenza A  Headache - not improving--for diagnostic and therapeutic tap today with Neurology and Anesthesia, cannot have fluoro at this GA HIV - continue Triumeq CHTN - BP is ok, on no meds--will need ASA after 12 weeks Influenza A - no real symptoms.  Subjective: Interval History:Headache is not really improved.   Objective: Vital signs in last 24 hours: Temp:  [98.3 F (36.8 C)-99.3 F (37.4 C)] 99.3 F (37.4 C) (12/09 0829) Pulse Rate:  [65-92] 65 (12/09 0829) Resp:  [18-19] 19 (12/09 0829) BP: (115-140)/(58-88) 115/58 (12/09 0829) SpO2:  [100 %] 100 % (12/09 0829)  Intake/Output from previous day: 12/08 0701 - 12/09 0700 In: 682.3 [I.V.:682.3] Out: -  Intake/Output this shift: No intake/output data recorded.  General appearance: alert, cooperative, and appears stated age Head: Normocephalic, without obvious abnormality, atraumatic Neck: no adenopathy, supple, symmetrical, trachea midline, and thyroid not enlarged, symmetric, no tenderness/mass/nodules Lungs: normal effort Heart: regular rate and rhythm Abdomen: soft, non-tender; bowel sounds normal; no masses,  no organomegaly Extremities: extremities normal, atraumatic, no cyanosis or edema Skin: Skin color, texture, turgor normal. No rashes or lesions Neurologic: Grossly normal  Results for orders placed or performed during the hospital encounter of 01/16/21 (from the past 24 hour(s))  Urinalysis, Routine w reflex microscopic Urine, Clean Catch     Status: None   Collection Time: 01/16/21  2:40 PM  Result Value Ref Range   Color, Urine YELLOW YELLOW   APPearance CLEAR CLEAR   Specific Gravity, Urine 1.020 1.005 - 1.030   pH 6.0 5.0 - 8.0   Glucose, UA NEGATIVE NEGATIVE mg/dL   Hgb  urine dipstick NEGATIVE NEGATIVE   Bilirubin Urine NEGATIVE NEGATIVE   Ketones, ur NEGATIVE NEGATIVE mg/dL   Protein, ur NEGATIVE NEGATIVE mg/dL   Nitrite NEGATIVE NEGATIVE   Leukocytes,Ua NEGATIVE NEGATIVE  Resp Panel by RT-PCR (Flu A&B, Covid) Nasopharyngeal Swab     Status: Abnormal   Collection Time: 01/16/21  9:31 PM   Specimen: Nasopharyngeal Swab; Nasopharyngeal(NP) swabs in vial transport medium  Result Value Ref Range   SARS Coronavirus 2 by RT PCR NEGATIVE NEGATIVE   Influenza A by PCR POSITIVE (A) NEGATIVE   Influenza B by PCR NEGATIVE NEGATIVE  Type and screen [facility]     Status: None   Collection Time: 01/16/21 10:03 PM  Result Value Ref Range   ABO/RH(D) A POS    Antibody Screen NEG    Sample Expiration      01/19/2021,2359 Performed at [facility], [address], [place], [place] [address]   Hepatitis B surface antigen     Status: None   Collection Time: 01/16/21 10:03 PM  Result Value Ref Range   Hepatitis B Surface Ag NON REACTIVE NON REACTIVE  RPR     Status: None   Collection Time: 01/16/21 10:03 PM  Result Value Ref Range   RPR Ser Ql NON REACTIVE NON REACTIVE  CBC     Status: Abnormal   Collection Time: 01/16/21 10:03 PM  Result Value Ref Range   WBC 3.2 (L) 4.0 - 10.5 K/uL   RBC 3.96 3.87 - 5.11 MIL/uL   Hemoglobin 11.1 (L) 12.0 - 15.0 g/dL   HCT 19.1 (L) 47.8 - 29.5 %  MCV 79.8 (L) 80.0 - 100.0 fL   MCH 28.0 26.0 - 34.0 pg   MCHC 35.1 30.0 - 36.0 g/dL   RDW 83.4 19.6 - 22.2 %   Platelets 186 150 - 400 K/uL   nRBC 0.0 0.0 - 0.2 %  Differential     Status: Abnormal   Collection Time: 01/16/21 10:03 PM  Result Value Ref Range   Neutrophils Relative % 33 %   Neutro Abs 1.0 (L) 1.7 - 7.7 K/uL   Lymphocytes Relative 54 %   Lymphs Abs 1.7 0.7 - 4.0 K/uL   Monocytes Relative 12 %   Monocytes Absolute 0.4 0.1 - 1.0 K/uL   Eosinophils Relative 0 %   Eosinophils Absolute 0.0 0.0 - 0.5 K/uL   Basophils Relative 1 %    Basophils Absolute 0.0 0.0 - 0.1 K/uL   Immature Granulocytes 0 %   Abs Immature Granulocytes 0.01 0.00 - 0.07 K/uL    Studies/Results: CT HEAD WO CONTRAST ( )  Result Date: 01/14/2021 CLINICAL DATA:  Headache, new or worsening, pregnant EXAM: CT HEAD WITHOUT CONTRAST TECHNIQUE: Contiguous axial images were obtained from the base of the skull through the vertex without intravenous contrast. COMPARISON:  CT head April 01, 2020. FINDINGS: Brain: No evidence of acute infarction, hemorrhage, hydrocephalus, extra-axial collection or mass lesion/mass effect. Partially empty sella. Vascular: No hyperdense vessel identified. Skull: No evidence of acute fracture. Sinuses/Orbits: Clear sinuses.  Unremarkable orbits. Other: No mastoid effusions. IMPRESSION: 1. No evidence of acute intracranial abnormality. 2. Partially empty sella, which is often a normal anatomic variant but can be associated with idiopathic intracranial hypertension. Electronically Signed   By: Feliberto Harts M.D.   On: 01/14/2021 13:36   MR ANGIO HEAD WO CONTRAST  Result Date: 01/16/2021 CLINICAL DATA:  Initial evaluation for cerebral aneurysm screening. EXAM: MRA HEAD WITHOUT CONTRAST TECHNIQUE: Angiographic images of the Circle of Willis were acquired using MRA technique without intravenous contrast. COMPARISON:  Prior study from 01/14/2021 FINDINGS: Anterior circulation: Examination mildly degraded by motion artifact. Visualized distal cervical segments of both internal carotid arteries are widely patent with antegrade flow. Petrous, cavernous, and supraclinoid segments patent without stenosis or other abnormality. A1 segments widely patent. Normal anterior communicating artery complex. Anterior cerebral arteries patent without stenosis. No M1 stenosis or occlusion. Normal MCA bifurcations. Distal MCA branches perfused and symmetric. Posterior circulation: Both vertebral arteries widely patent to the vertebrobasilar junction without  stenosis. Both PICA origins patent and normal. Basilar widely patent to its distal aspect without stenosis. Superior cerebellar arteries patent bilaterally. Both PCAs primarily supplied via the basilar well perfused or distal aspects. Anatomic variants: None significant. Other: No intracranial aneurysm or other vascular abnormality. IMPRESSION: Normal intracranial MRA. No aneurysm or other vascular abnormality. Electronically Signed   By: Rise Mu M.D.   On: 01/16/2021 22:34   MR BRAIN WO CONTRAST  Result Date: 01/14/2021 CLINICAL DATA:  Dizziness, non-specific Headache, new or worsening, pregnant; Dural venous sinus thrombosis suspected EXAM: MRI HEAD WITHOUT CONTRAST MRV HEAD WITHOUT CONTRAST TECHNIQUE: Multiplanar, multi-echo pulse sequences of the brain and surrounding structures were acquired without intravenous contrast. Angiographic images of the intracranial venous structures were acquired using MRV technique without intravenous contrast. COMPARISON:  Same day CT head. FINDINGS: MRI HEAD WITHOUT CONTRAST Brain: No acute infarction, hemorrhage, hydrocephalus, extra-axial collection or mass lesion. Partially empty sella. Vascular: Major arterial flow voids are maintained at the skull base. Skull and upper cervical spine: Normal marrow signal. Sinuses/Orbits: Clear sinuses.  Unremarkable orbits.  Other: No sizable mastoid effusions. MR VENOGRAM WITHOUT CONTRAST No evidence of dural venous sinus thrombosis. The superior sagittal sinus, transverse sinuses, and sigmoid sinuses are patent. Bilateral visualized upper internal jugular veins are patent. The visualized deep cerebral veins are patent. Stenosis of the distal transverse sinuses bilaterally. IMPRESSION: 1. No evidence of acute intracranial abnormality. 2. No evidence of dural venous sinus thrombosis. 3. Partially empty sella none and narrowing of the distal transverse sinuses bilaterally. These findings could be anatomic variants, but can be  seen with idiopathic intracranial hypertension in the correct clinical setting. Electronically Signed   By: Feliberto Harts M.D.   On: 01/14/2021 18:22   US OB Comp Less 14 Wks  Result Date: 12/31/2020 CLINICAL DATA:  Abdominal pain in first trimester of pregnancy, O26.891; LMP 09/09/2020, no current quantitative beta HCG yet available for correlation EXAM: OBSTETRIC <14 WK ULTRASOUND TECHNIQUE: Transabdominal ultrasound was performed for evaluation of the gestation as well as the maternal uterus and adnexal regions. COMPARISON:  None for this gestation FINDINGS: Intrauterine gestational sac: Present, single Yolk sac:  Present Embryo:  Present Cardiac Activity: Present Heart Rate: 166 bpm CRL:   17.6 mm   8 w 1 d                  [place] EDC: 08/11/2021 Subchorionic hemorrhage:  None visualized. Maternal uterus/adnexae: Remainder of uterus unremarkable. LEFT ovary normal size and morphology, 2.0 x 2.2 x 1.4 cm. RIGHT ovary measures 4.2 x 2.7 x 3.2 cm and contains a small corpus luteal cyst. No free pelvic fluid or adnexal masses. IMPRESSION: Single live intrauterine gestation at 8 weeks 1 day EGA by crown-rump length. No acute abnormalities. Electronically Signed   By: [name] M.D.   On: 12/31/2020 11:05   MR MRV HEAD WO CM  Result Date: 01/14/2021 CLINICAL DATA:  Dizziness, non-specific Headache, new or worsening, pregnant; Dural venous sinus thrombosis suspected EXAM: MRI HEAD WITHOUT CONTRAST MRV HEAD WITHOUT CONTRAST TECHNIQUE: Multiplanar, multi-echo pulse sequences of the brain and surrounding structures were acquired without intravenous contrast. Angiographic images of the intracranial venous structures were acquired using MRV technique without intravenous contrast. COMPARISON:  Same day CT head. FINDINGS: MRI HEAD WITHOUT CONTRAST Brain: No acute infarction, hemorrhage, hydrocephalus, extra-axial collection or mass lesion. Partially empty sella. Vascular: Major arterial flow voids are maintained at  the skull base. Skull and upper cervical spine: Normal marrow signal. Sinuses/Orbits: Clear sinuses.  Unremarkable orbits. Other: No sizable mastoid effusions. MR VENOGRAM WITHOUT CONTRAST No evidence of dural venous sinus thrombosis. The superior sagittal sinus, transverse sinuses, and sigmoid sinuses are patent. Bilateral visualized upper internal jugular veins are patent. The visualized deep cerebral veins are patent. Stenosis of the distal transverse sinuses bilaterally. IMPRESSION: 1. No evidence of acute intracranial abnormality. 2. No evidence of dural venous sinus thrombosis. 3. Partially empty sella none and narrowing of the distal transverse sinuses bilaterally. These findings could be anatomic variants, but can be seen with idiopathic intracranial hypertension in the correct clinical setting. Electronically Signed   By: Feliberto Harts M.D.   On: 01/14/2021 18:22    Scheduled Meds:  docusate sodium  100 mg Oral Daily   prenatal multivitamin  1 tablet Oral Q1200   Continuous Infusions:  lactated ringers 100 mL/hr at 01/17/21 0655   promethazine (PHENERGAN) injection (IM or IVPB) 25 mg (01/17/21 1030)   PRN Meds:acetaminophen, calcium carbonate, cyclobenzaprine, LORazepam, promethazine (PHENERGAN) injection (IM or IVPB), zolpidem    LOS: 0 days  Reva Bores, MD 01/17/2021 11:25 AM

## 2021-01-17 NOTE — Anesthesia Preprocedure Evaluation (Signed)
Anesthesia Evaluation  Patient identified by MRN, date of birth, ID band Patient awake    Reviewed: Allergy & Precautions, Patient's Chart, lab work & pertinent test results, reviewed documented beta blocker date and time   Airway Mallampati: III  TM Distance: >3 FB Neck ROM: Full    Dental no notable dental hx. (+) Dental Advisory Given   Pulmonary neg pulmonary ROS, former smoker,    Pulmonary exam normal breath sounds clear to auscultation       Cardiovascular hypertension, Pt. on medications and Pt. on home beta blockers Normal cardiovascular exam Rhythm:Regular Rate:Normal     Neuro/Psych  Headaches, PSYCHIATRIC DISORDERS Anxiety Depression Presented to [facility] [redacted] wks pregnant w/ intractable HA- imaging performed, concern for intracranial hypertension  CT: 1. No evidence of acute intracranial abnormality. 2. Partially empty sella, which is often a normal anatomic variant but can be associated with idiopathic intracranial hypertension.    GI/Hepatic negative GI ROS, Neg liver ROS,   Endo/Other  Morbid obesityBMI 41  Renal/GU negative Renal ROS  negative genitourinary   Musculoskeletal negative musculoskeletal ROS (+)   Abdominal (+) + obese,   Peds  Hematology negative hematology ROS (+)   Anesthesia Other Findings   Reproductive/Obstetrics (+) Pregnancy [redacted] wks pregnant                              Anesthesia Physical Anesthesia Plan  ASA: 3  Anesthesia Plan: Spinal   Post-op Pain Management:    Induction:   PONV Risk Score and Plan:   Airway Management Planned: Natural Airway  Additional Equipment: None  Intra-op Plan:   Post-operative Plan:   Informed Consent: I have reviewed the patients History and Physical, chart, labs and discussed the procedure including the risks, benefits and alternatives for the proposed anesthesia with the patient or authorized representative  who has indicated his/her understanding and acceptance.       Plan Discussed with:   Anesthesia Plan Comments: (Requested by OBGYN Dr. Shawnie Pons to assist neurologist w/ lumbar puncture after multiple failed attempts the day prior by neurology. IR refusing to do LP under fluoro d/t pregnancy.  I performed LP w/ requested 22G needle for opening pressure measurement. Neurologist performed opening pressure measurement and collected CSF samples. )        Anesthesia Quick Evaluation

## 2021-01-17 NOTE — Anesthesia Postprocedure Evaluation (Signed)
Anesthesia Post Note  Patient: [name]  Procedure(s) Performed: AN AD HOC LABOR EPIDURAL     Patient location during evaluation: OB High Risk Anesthesia plan: lumbar puncture. Level of consciousness: awake and alert and oriented Pain management: pain level controlled Vital Signs Assessment: post-procedure vital signs reviewed and stable Respiratory status: spontaneous breathing, respiratory function stable and nonlabored ventilation Cardiovascular status: stable and blood pressure returned to baseline Postop Assessment: no backache and no apparent nausea or vomiting Anesthetic complications: no Comments: Lumbar puncture performed at [facility] request w/ neurologist present   No notable events documented.  Last Vitals:  Vitals:   01/17/21 1236 01/17/21 1303  BP: 120/67 (!) 112/46  Pulse: 69 (!) 55  Resp: 16 17  Temp:    SpO2: 100%     Last Pain:  Vitals:   01/17/21 1500  TempSrc:   PainSc: Asleep                 Lannie Fields

## 2021-01-18 LAB — CSF CELL COUNT WITH DIFFERENTIAL
RBC Count, CSF: 44 /mm3 — ABNORMAL HIGH
Tube #: 1
WBC, CSF: 0 /mm3 (ref 0–5)

## 2021-01-18 LAB — RUBELLA SCREEN: Rubella: 2.47 index (ref >0.99)

## 2021-01-18 MED ORDER — CYCLOBENZAPRINE HCL 10 MG PO TABS
10.0000 mg | ORAL_TABLET | Freq: Three times a day (TID) | ORAL | 0 refills | Status: DC | PRN
Start: 2021-01-18 — End: 2021-03-24

## 2021-01-18 NOTE — Plan of Care (Signed)
Neurology plan of care: No charge note.   Stopped by to see patient and she was dressed to go home. Appearing well today. States she gets dizzy if she moves her head side to side quickly and going from sitting to standing. Her LP opening pressure was not consistent with IIH. Her HAs have decreased in intensity from treatment with medicines per OB. Cautioned patient to move slowly when going from lying to sitting, sitting to standing, and before taking steps. Advised patient that she may need someone beside her when standing up and walking for safety sake.   Patient to be discharged soon.   Jimmye Norman, NP/Neurology

## 2021-01-18 NOTE — Discharge Summary (Signed)
Antenatal Physician Discharge Summary  Patient ID: Catherine Simmons MRN: [id number] DOB/AGE: 35-Jun-1987 35 y.o.  Admit date: 01/16/2021 Discharge date: 01/18/2021  Admission Diagnoses: Principal Problem:   Headache Active Problems:   Human immunodeficiency virus (HIV) disease (HCC)   Chronic hypertension   Influenza A   Discharge Diagnoses: Same  Prenatal Procedures: LP  Consults: Neurology, Anesthesia  Hospital Course:  Catherine Simmons is a 35 y.o. [id number] with IUP at [redacted]w[redacted]d admitted for headache and ? IIH.  She was admitted with headache and imaging findings concerning for IIH with empty sella. Attempt at therapeutic and diagnostic tap done in MAU after 2nd visit in 2 days and no real improvement. This was unsuccessful and she was admitted for possible IR tap. Given current GA and need for Fluoro, IR declined. Arranged tap with anesthesia and Neuro. Opening pressure was not c/w IIH. She had a normal glucose, low normal protein. Gram stain is negative and few WBC's a mononuclear pattern. She also had flu. Neurology suspected this was migraine induced by influenza A. Given steroids, Magnesium and anti-emetics. Flexeril as well, which seemed to work best. H/a improved to a tolerable level. She was deemed stable for discharge to home with outpatient follow up.  Discharge Exam: Temp:  [98.4 F (36.9 C)-99 F (37.2 C)] 98.4 F (36.9 C) (12/10 0923) Pulse Rate:  [55-80] 55 (12/10 0923) Resp:  [16-19] 18 (12/10 0923) BP: (112-144)/(46-70) 144/69 (12/10 0923) SpO2:  [96 %-100 %] 100 % (12/10 0174) Physical Examination: CONSTITUTIONAL: Well-developed, well-nourished female in no acute distress.  HENT:  Normocephalic, atraumatic, External right and left ear normal.  EYES: Conjunctivae and EOM are normal. Pupils are equal, round, and reactive to light. No scleral icterus.  NECK: Normal range of motion, supple, no masses SKIN: Skin is warm and dry. No rash noted. Not diaphoretic. No  erythema. No pallor. NEUROLOGIC: Alert and oriented to person, place, and time.  PSYCHIATRIC: Normal mood and affect. Normal behavior. Normal judgment and thought content. CARDIOVASCULAR: Normal heart rate noted, regular rhythm RESPIRATORY: Effort and breath sounds normal, no problems with respiration noted MUSCULOSKELETAL: Normal range of motion. No edema and no tenderness. 2+ distal pulses. ABDOMEN: Soft, nontender, nondistended, gravid.   Significant Diagnostic Studies:  Results for orders placed or performed during the hospital encounter of 01/16/21 (from the past 168 hour(s))  Urinalysis, Routine w reflex microscopic Urine, Clean Catch   Collection Time: 01/16/21  2:40 PM  Result Value Ref Range   Color, Urine YELLOW YELLOW   APPearance CLEAR CLEAR   Specific Gravity, Urine 1.020 1.005 - 1.030   pH 6.0 5.0 - 8.0   Glucose, UA NEGATIVE NEGATIVE mg/dL   Hgb urine dipstick NEGATIVE NEGATIVE   Bilirubin Urine NEGATIVE NEGATIVE   Ketones, ur NEGATIVE NEGATIVE mg/dL   Protein, ur NEGATIVE NEGATIVE mg/dL   Nitrite NEGATIVE NEGATIVE   Leukocytes,Ua NEGATIVE NEGATIVE  Resp Panel by RT-PCR (Flu A&B, Covid) Nasopharyngeal Swab   Collection Time: 01/16/21  9:31 PM   Specimen: Nasopharyngeal Swab; Nasopharyngeal(NP) swabs in vial transport medium  Result Value Ref Range   SARS Coronavirus 2 by RT PCR NEGATIVE NEGATIVE   Influenza A by PCR POSITIVE (A) NEGATIVE   Influenza B by PCR NEGATIVE NEGATIVE  Hepatitis B surface antigen   Collection Time: 01/16/21 10:03 PM  Result Value Ref Range   Hepatitis B Surface Ag NON REACTIVE NON REACTIVE  RPR   Collection Time: 01/16/21 10:03 PM  Result Value Ref Range   RPR  Ser Ql NON REACTIVE NON REACTIVE  CBC   Collection Time: 01/16/21 10:03 PM  Result Value Ref Range   WBC 3.2 (L) 4.0 - 10.5 K/uL   RBC 3.96 3.87 - 5.11 MIL/uL   Hemoglobin 11.1 (L) 12.0 - 15.0 g/dL   HCT 96.0 (L) 45.4 - 09.8 %   MCV 79.8 (L) 80.0 - 100.0 fL   MCH 28.0 26.0  - 34.0 pg   MCHC 35.1 30.0 - 36.0 g/dL   RDW 11.9 14.7 - 82.9 %   Platelets 186 150 - 400 K/uL   nRBC 0.0 0.0 - 0.2 %  Differential   Collection Time: 01/16/21 10:03 PM  Result Value Ref Range   Neutrophils Relative % 33 %   Neutro Abs 1.0 (L) 1.7 - 7.7 K/uL   Lymphocytes Relative 54 %   Lymphs Abs 1.7 0.7 - 4.0 K/uL   Monocytes Relative 12 %   Monocytes Absolute 0.4 0.1 - 1.0 K/uL   Eosinophils Relative 0 %   Eosinophils Absolute 0.0 0.0 - 0.5 K/uL   Basophils Relative 1 %   Basophils Absolute 0.0 0.0 - 0.1 K/uL   Immature Granulocytes 0 %   Abs Immature Granulocytes 0.01 0.00 - 0.07 K/uL  Type and screen [facility]   Collection Time: 01/16/21 10:03 PM  Result Value Ref Range   ABO/RH(D) A POS    Antibody Screen NEG    Sample Expiration      01/19/2021,2359 Performed at [facility], [address] [place], [place] [address]   CSF culture w Gram Stain   Collection Time: 01/17/21 10:00 AM   Specimen: CSF; Cerebrospinal Fluid  Result Value Ref Range   Specimen Description CSF    Special Requests NONE    Gram Stain      WBC PRESENT, PREDOMINANTLY MONONUCLEAR NO ORGANISMS SEEN CYTOSPIN SMEAR Performed at [facility], [address], [place], [place] [address]    Culture PENDING    Report Status PENDING   CSF cell count with differential collection tube #: 1   Collection Time: 01/17/21  1:00 PM  Result Value Ref Range   Tube # 1    Color, CSF COLORLESS COLORLESS   Appearance, CSF CLEAR (A) CLEAR   Supernatant NOT INDICATED    RBC Count, CSF 44 (H) 0 /cu mm   Other Cells, CSF TOO FEW TO COUNT, SMEAR AVAILABLE FOR REVIEW   Protein and glucose, CSF   Collection Time: 01/17/21  1:00 PM  Result Value Ref Range   Glucose, CSF 47 40 - 70 mg/dL   Total  Protein, CSF 14 (L) 15 - 45 mg/dL  CSF cell count with differential collection tube #: 2   Collection Time: 01/17/21  1:00 PM  Result Value Ref Range   Tube # 2    Color, CSF COLORLESS  COLORLESS   Appearance, CSF CLEAR CLEAR   Supernatant NOT INDICATED    RBC Count, CSF 17 (H) 0 /cu mm   WBC, CSF 0 0 - 5 /cu mm   Other Cells, CSF TOO FEW TO COUNT, SMEAR AVAILABLE FOR REVIEW   Results for orders placed or performed during the hospital encounter of 01/14/21 (from the past 168 hour(s))  Urinalysis, Routine w reflex microscopic Urine, Clean Catch   Collection Time: 01/14/21 10:34 AM  Result Value Ref Range   Color, Urine YELLOW YELLOW   APPearance CLEAR CLEAR   Specific Gravity, Urine 1.020 1.005 - 1.030  pH 6.0 5.0 - 8.0   Glucose, UA NEGATIVE NEGATIVE mg/dL   Hgb urine dipstick NEGATIVE NEGATIVE   Bilirubin Urine NEGATIVE NEGATIVE   Ketones, ur NEGATIVE NEGATIVE mg/dL   Protein, ur NEGATIVE NEGATIVE mg/dL   Nitrite NEGATIVE NEGATIVE   Leukocytes,Ua NEGATIVE NEGATIVE  Protein / creatinine ratio, urine   Collection Time: 01/14/21 10:34 AM  Result Value Ref Range   Creatinine, Urine 160.31 mg/dL   Total Protein, Urine 13 mg/dL   Protein Creatinine Ratio 0.08 0.00 - 0.15 mg/mg[Cre]  CBC   Collection Time: 01/14/21 10:59 AM  Result Value Ref Range   WBC 3.5 (L) 4.0 - 10.5 K/uL   RBC 4.22 3.87 - 5.11 MIL/uL   Hemoglobin 11.6 (L) 12.0 - 15.0 g/dL   HCT 16.1 (L) 09.6 - 04.5 %   MCV 81.3 80.0 - 100.0 fL   MCH 27.5 26.0 - 34.0 pg   MCHC 33.8 30.0 - 36.0 g/dL   RDW 40.9 81.1 - 91.4 %   Platelets 201 150 - 400 K/uL   nRBC 0.0 0.0 - 0.2 %  Comprehensive metabolic panel   Collection Time: 01/14/21 10:59 AM  Result Value Ref Range   Sodium 133 (L) 135 - 145 mmol/L   Potassium 3.8 3.5 - 5.1 mmol/L   Chloride 103 98 - 111 mmol/L   CO2 22 22 - 32 mmol/L   Glucose, Bld 95 70 - 99 mg/dL   BUN 5 (L) 6 - 20 mg/dL   Creatinine, Ser 7.82 0.44 - 1.00 mg/dL   Calcium 8.7 (L) 8.9 - 10.3 mg/dL   Total Protein 6.4 (L) 6.5 - 8.1 g/dL   Albumin 3.2 (L) 3.5 - 5.0 g/dL   AST 12 (L) 15 - 41 U/L   ALT 12 0 - 44 U/L   Alkaline Phosphatase 56 38 - 126 U/L   Total Bilirubin 0.3  0.3 - 1.2 mg/dL   GFR, Estimated >95 >62 mL/min   Anion gap 8 5 - 15   CT HEAD WO CONTRAST ( )  Result Date: 01/14/2021 CLINICAL DATA:  Headache, new or worsening, pregnant EXAM: CT HEAD WITHOUT CONTRAST TECHNIQUE: Contiguous axial images were obtained from the base of the skull through the vertex without intravenous contrast. COMPARISON:  CT head April 01, 2020. FINDINGS: Brain: No evidence of acute infarction, hemorrhage, hydrocephalus, extra-axial collection or mass lesion/mass effect. Partially empty sella. Vascular: No hyperdense vessel identified. Skull: No evidence of acute fracture. Sinuses/Orbits: Clear sinuses.  Unremarkable orbits. Other: No mastoid effusions. IMPRESSION: 1. No evidence of acute intracranial abnormality. 2. Partially empty sella, which is often a normal anatomic variant but can be associated with idiopathic intracranial hypertension. Electronically Signed   By: Feliberto Harts M.D.   On: 01/14/2021 13:36   MR ANGIO HEAD WO CONTRAST  Result Date: 01/16/2021 CLINICAL DATA:  Initial evaluation for cerebral aneurysm screening. EXAM: MRA HEAD WITHOUT CONTRAST TECHNIQUE: Angiographic images of the Circle of Willis were acquired using MRA technique without intravenous contrast. COMPARISON:  Prior study from 01/14/2021 FINDINGS: Anterior circulation: Examination mildly degraded by motion artifact. Visualized distal cervical segments of both internal carotid arteries are widely patent with antegrade flow. Petrous, cavernous, and supraclinoid segments patent without stenosis or other abnormality. A1 segments widely patent. Normal anterior communicating artery complex. Anterior cerebral arteries patent without stenosis. No M1 stenosis or occlusion. Normal MCA bifurcations. Distal MCA branches perfused and symmetric. Posterior circulation: Both vertebral arteries widely patent to the vertebrobasilar junction without stenosis. Both PICA origins patent and  normal. Basilar widely patent  to its distal aspect without stenosis. Superior cerebellar arteries patent bilaterally. Both PCAs primarily supplied via the basilar well perfused or distal aspects. Anatomic variants: None significant. Other: No intracranial aneurysm or other vascular abnormality. IMPRESSION: Normal intracranial MRA. No aneurysm or other vascular abnormality. Electronically Signed   By: Rise Mu M.D.   On: 01/16/2021 22:34   MR BRAIN WO CONTRAST  Result Date: 01/14/2021 CLINICAL DATA:  Dizziness, non-specific Headache, new or worsening, pregnant; Dural venous sinus thrombosis suspected EXAM: MRI HEAD WITHOUT CONTRAST MRV HEAD WITHOUT CONTRAST TECHNIQUE: Multiplanar, multi-echo pulse sequences of the brain and surrounding structures were acquired without intravenous contrast. Angiographic images of the intracranial venous structures were acquired using MRV technique without intravenous contrast. COMPARISON:  Same day CT head. FINDINGS: MRI HEAD WITHOUT CONTRAST Brain: No acute infarction, hemorrhage, hydrocephalus, extra-axial collection or mass lesion. Partially empty sella. Vascular: Major arterial flow voids are maintained at the skull base. Skull and upper cervical spine: Normal marrow signal. Sinuses/Orbits: Clear sinuses.  Unremarkable orbits. Other: No sizable mastoid effusions. MR VENOGRAM WITHOUT CONTRAST No evidence of dural venous sinus thrombosis. The superior sagittal sinus, transverse sinuses, and sigmoid sinuses are patent. Bilateral visualized upper internal jugular veins are patent. The visualized deep cerebral veins are patent. Stenosis of the distal transverse sinuses bilaterally. IMPRESSION: 1. No evidence of acute intracranial abnormality. 2. No evidence of dural venous sinus thrombosis. 3. Partially empty sella none and narrowing of the distal transverse sinuses bilaterally. These findings could be anatomic variants, but can be seen with idiopathic intracranial hypertension in the correct  clinical setting. Electronically Signed   By: Feliberto Harts M.D.   On: 01/14/2021 18:22   US OB Comp Less 14 Wks  Result Date: 12/31/2020 CLINICAL DATA:  Abdominal pain in first trimester of pregnancy, O26.891; LMP 09/09/2020, no current quantitative beta HCG yet available for correlation EXAM: OBSTETRIC <14 WK ULTRASOUND TECHNIQUE: Transabdominal ultrasound was performed for evaluation of the gestation as well as the maternal uterus and adnexal regions. COMPARISON:  None for this gestation FINDINGS: Intrauterine gestational sac: Present, single Yolk sac:  Present Embryo:  Present Cardiac Activity: Present Heart Rate: 166 bpm CRL:   17.6 mm   8 w 1 d                  [place] EDC: 08/11/2021 Subchorionic hemorrhage:  None visualized. Maternal uterus/adnexae: Remainder of uterus unremarkable. LEFT ovary normal size and morphology, 2.0 x 2.2 x 1.4 cm. RIGHT ovary measures 4.2 x 2.7 x 3.2 cm and contains a small corpus luteal cyst. No free pelvic fluid or adnexal masses. IMPRESSION: Single live intrauterine gestation at 8 weeks 1 day EGA by crown-rump length. No acute abnormalities. Electronically Signed   By: [name] M.D.   On: 12/31/2020 11:05   MR MRV HEAD WO CM  Result Date: 01/14/2021 CLINICAL DATA:  Dizziness, non-specific Headache, new or worsening, pregnant; Dural venous sinus thrombosis suspected EXAM: MRI HEAD WITHOUT CONTRAST MRV HEAD WITHOUT CONTRAST TECHNIQUE: Multiplanar, multi-echo pulse sequences of the brain and surrounding structures were acquired without intravenous contrast. Angiographic images of the intracranial venous structures were acquired using MRV technique without intravenous contrast. COMPARISON:  Same day CT head. FINDINGS: MRI HEAD WITHOUT CONTRAST Brain: No acute infarction, hemorrhage, hydrocephalus, extra-axial collection or mass lesion. Partially empty sella. Vascular: Major arterial flow voids are maintained at the skull base. Skull and upper cervical spine: Normal marrow  signal. Sinuses/Orbits: Clear sinuses.  Unremarkable orbits.  Other: No sizable mastoid effusions. MR VENOGRAM WITHOUT CONTRAST No evidence of dural venous sinus thrombosis. The superior sagittal sinus, transverse sinuses, and sigmoid sinuses are patent. Bilateral visualized upper internal jugular veins are patent. The visualized deep cerebral veins are patent. Stenosis of the distal transverse sinuses bilaterally. IMPRESSION: 1. No evidence of acute intracranial abnormality. 2. No evidence of dural venous sinus thrombosis. 3. Partially empty sella none and narrowing of the distal transverse sinuses bilaterally. These findings could be anatomic variants, but can be seen with idiopathic intracranial hypertension in the correct clinical setting. Electronically Signed   By: Feliberto Harts M.D.   On: 01/14/2021 18:22    Future Appointments  Date Time Provider Department Center  01/20/2021 10:15 AM [name], MD [facility]  01/22/2021  1:30 PM Anson Fret, MD [facility] None  01/24/2021 11:30 AM Blanchard Kelch, NP [facility]  03/17/2021  9:30 AM [facility] NURSE [facility]  03/17/2021  9:45 AM [facility] US4 [facility]    Discharge Condition: Stable  Discharge disposition: 01-Home or Self Care       Discharge Instructions     Discharge activity:  No Restrictions   Complete by: As directed    Discharge diet:  No restrictions   Complete by: As directed    No sexual activity restrictions   Complete by: As directed       Allergies as of 01/18/2021       Reactions   Butorphanol Itching   Tolerates percocet   Stadol [butorphanol Tartrate] Itching   Tolerates percocet        Medication List     TAKE these medications    acetaminophen 500 MG tablet Commonly known as: TYLENOL Take 500-1,000 mg by mouth every 6 (six) hours as needed for mild pain or headache.   cyclobenzaprine 5 MG tablet Commonly known as: FLEXERIL Take 1 tablet (5 mg total) by mouth 3 (three) times  daily as needed for muscle spasms (or headache). What changed: Another medication with the same name was added. Make sure you understand how and when to take each.   cyclobenzaprine 10 MG tablet Commonly known as: FLEXERIL Take 1 tablet (10 mg total) by mouth 3 (three) times daily as needed for muscle spasms (headaches). What changed: You were already taking a medication with the same name, and this prescription was added. Make sure you understand how and when to take each.   labetalol 200 MG tablet Commonly known as: NORMODYNE Take 1 tablet (200 mg total) by mouth 2 (two) times daily.   metoCLOPramide 10 MG tablet Commonly known as: REGLAN Take 1 tablet (10 mg total) by mouth every 8 (eight) hours as needed for nausea (or headache).   oxyCODONE-acetaminophen 5-325 MG tablet Commonly known as: PERCOCET/ROXICET Take 1 tablet by mouth every 6 (six) hours as needed for severe pain.   Triumeq 600-50-300 MG tablet Generic drug: abacavir-dolutegravir-lamiVUDine Take 1 tablet by mouth daily.        Follow-up Information     [facility] Follow up in 1 week(s).   Specialty: Obstetrics and Gynecology Why: Hospital follow-up, New OB visit, keep next scheduled appointment Contact information: [address] [place] [address] [phone]                Total discharge time: 30 minutes   Signed: Reva Bores M.D. 01/18/2021, 9:41 AM

## 2021-01-18 NOTE — Plan of Care (Signed)
Pt to be discharged home with printed instructions. Marivel Mcclarty L Wane Mollett, RN  

## 2021-01-20 ENCOUNTER — Encounter (HOSPITAL_COMMUNITY): Payer: Self-pay | Admitting: Obstetrics & Gynecology

## 2021-01-20 ENCOUNTER — Inpatient Hospital Stay (HOSPITAL_COMMUNITY)
Admission: AD | Admit: 2021-01-20 | Discharge: 2021-01-20 | Disposition: A | Discharge status: Home / Self Care | Payer: Medicaid Other | Attending: Obstetrics & Gynecology | Admitting: Obstetrics & Gynecology

## 2021-01-20 ENCOUNTER — Inpatient Hospital Stay (HOSPITAL_COMMUNITY): Payer: Medicaid Other

## 2021-01-20 ENCOUNTER — Ambulatory Visit (INDEPENDENT_AMBULATORY_CARE_PROVIDER_SITE_OTHER): Payer: Medicaid Other | Admitting: Family Medicine

## 2021-01-20 ENCOUNTER — Other Ambulatory Visit: Payer: Self-pay

## 2021-01-20 ENCOUNTER — Ambulatory Visit (INDEPENDENT_AMBULATORY_CARE_PROVIDER_SITE_OTHER): Payer: Self-pay | Admitting: Family Medicine

## 2021-01-20 VITALS — BP 150/91 | HR 95

## 2021-01-20 DIAGNOSIS — O0990 Supervision of high risk pregnancy, unspecified, unspecified trimester: Secondary | ICD-10-CM

## 2021-01-20 DIAGNOSIS — R1031 Right lower quadrant pain: Secondary | ICD-10-CM | POA: Diagnosis not present

## 2021-01-20 DIAGNOSIS — O4290 Premature rupture of membranes, unspecified as to length of time between rupture and onset of labor, unspecified weeks of gestation: Secondary | ICD-10-CM | POA: Diagnosis not present

## 2021-01-20 DIAGNOSIS — O23591 Infection of other part of genital tract in pregnancy, first trimester: Secondary | ICD-10-CM | POA: Diagnosis not present

## 2021-01-20 DIAGNOSIS — N898 Other specified noninflammatory disorders of vagina: Secondary | ICD-10-CM

## 2021-01-20 DIAGNOSIS — Z3A11 11 weeks gestation of pregnancy: Secondary | ICD-10-CM

## 2021-01-20 DIAGNOSIS — O09521 Supervision of elderly multigravida, first trimester: Secondary | ICD-10-CM | POA: Diagnosis not present

## 2021-01-20 DIAGNOSIS — O200 Threatened abortion: Secondary | ICD-10-CM | POA: Diagnosis not present

## 2021-01-20 DIAGNOSIS — O26891 Other specified pregnancy related conditions, first trimester: Secondary | ICD-10-CM | POA: Diagnosis present

## 2021-01-20 DIAGNOSIS — B9689 Other specified bacterial agents as the cause of diseases classified elsewhere: Secondary | ICD-10-CM

## 2021-01-20 DIAGNOSIS — I10 Essential (primary) hypertension: Secondary | ICD-10-CM

## 2021-01-20 DIAGNOSIS — R109 Unspecified abdominal pain: Secondary | ICD-10-CM

## 2021-01-20 LAB — URINALYSIS, ROUTINE W REFLEX MICROSCOPIC
Bilirubin Urine: NEGATIVE
Glucose, UA: NEGATIVE mg/dL
Hgb urine dipstick: NEGATIVE
Ketones, ur: NEGATIVE mg/dL
Leukocytes,Ua: NEGATIVE
Nitrite: NEGATIVE
Protein, ur: NEGATIVE mg/dL
Specific Gravity, Urine: <1.005 — ABNORMAL LOW (ref 1.005–1.030)
pH: 6.5 (ref 5.0–8.0)

## 2021-01-20 LAB — COMPREHENSIVE METABOLIC PANEL
ALT: 19 U/L (ref 0–44)
AST: 14 U/L — ABNORMAL LOW (ref 15–41)
Albumin: 3 g/dL — ABNORMAL LOW (ref 3.5–5.0)
Alkaline Phosphatase: 50 U/L (ref 38–126)
Anion gap: 8 (ref 5–15)
BUN: <5 mg/dL — ABNORMAL LOW (ref 6–20)
CO2: 23 mmol/L (ref 22–32)
Calcium: 8.7 mg/dL — ABNORMAL LOW (ref 8.9–10.3)
Chloride: 105 mmol/L (ref 98–111)
Creatinine, Ser: 0.53 mg/dL (ref 0.44–1.00)
GFR, Estimated: >60 mL/min (ref >60)
Glucose, Bld: 91 mg/dL (ref 70–99)
Potassium: 3.8 mmol/L (ref 3.5–5.1)
Sodium: 136 mmol/L (ref 135–145)
Total Bilirubin: 0.3 mg/dL (ref 0.3–1.2)
Total Protein: 6.1 g/dL — ABNORMAL LOW (ref 6.5–8.1)

## 2021-01-20 LAB — POCT URINALYSIS DIP (DEVICE)
Bilirubin Urine: NEGATIVE
Glucose, UA: NEGATIVE mg/dL
Hgb urine dipstick: NEGATIVE
Ketones, ur: NEGATIVE mg/dL
Nitrite: NEGATIVE
Protein, ur: NEGATIVE mg/dL
Specific Gravity, Urine: 1.02 (ref 1.005–1.030)
Urobilinogen, UA: 0.2 mg/dL (ref 0.0–1.0)
pH: 6.5 (ref 5.0–8.0)

## 2021-01-20 LAB — CBC
HCT: 33.8 % — ABNORMAL LOW (ref 36.0–46.0)
Hemoglobin: 11.7 g/dL — ABNORMAL LOW (ref 12.0–15.0)
MCH: 27.9 pg (ref 26.0–34.0)
MCHC: 34.6 g/dL (ref 30.0–36.0)
MCV: 80.5 fL (ref 80.0–100.0)
Platelets: 183 10*3/uL (ref 150–400)
RBC: 4.2 MIL/uL (ref 3.87–5.11)
RDW: 11.7 % (ref 11.5–15.5)
WBC: 7.1 10*3/uL (ref 4.0–10.5)
nRBC: 0 % (ref 0.0–0.2)

## 2021-01-20 LAB — WET PREP, GENITAL
Sperm: NONE SEEN
Trich, Wet Prep: NONE SEEN
WBC, Wet Prep HPF POC: <10 (ref <10)
Yeast Wet Prep HPF POC: NONE SEEN

## 2021-01-20 LAB — POCT FERN TEST: POCT Fern Test: POSITIVE

## 2021-01-20 IMAGING — US US OB COMP LESS 14 WK
1 series · 15 of 28 positions shown · non-contrast
Comparison: [DATE].

CLINICAL DATA: Leaking fluid. Positive FERN test. Estimated
gestational age of 11 weeks, 0 days by LMP.

EXAM:
OBSTETRIC <14 WK ULTRASOUND
TECHNIQUE: Transabdominal ultrasound was performed for evaluation of the
gestation as well as the maternal uterus and adnexal regions.

[Series 1: us ob comp less 14 wk · 15 of 40 slices shown]
[im 1/40]
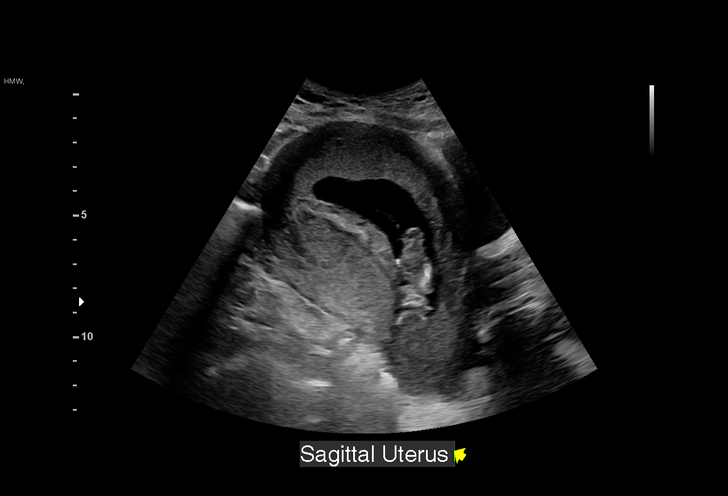
[im 3/40]
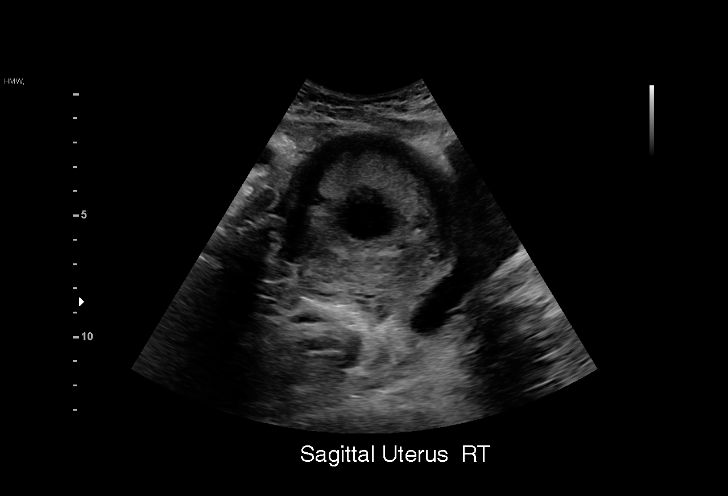
[im 6/40]
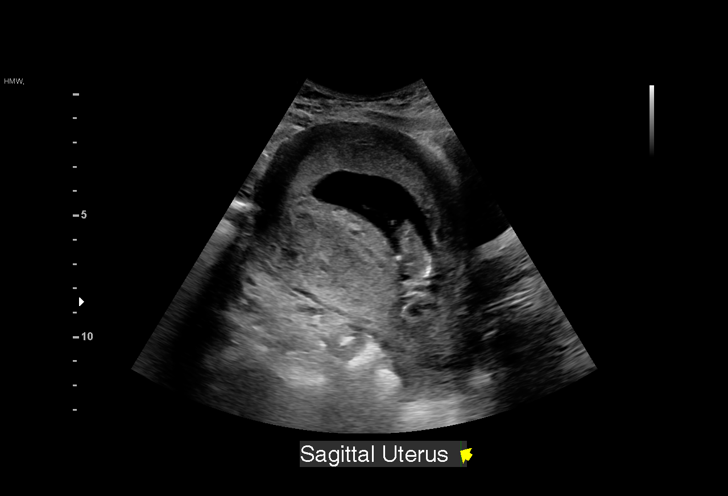
[im 9/40]
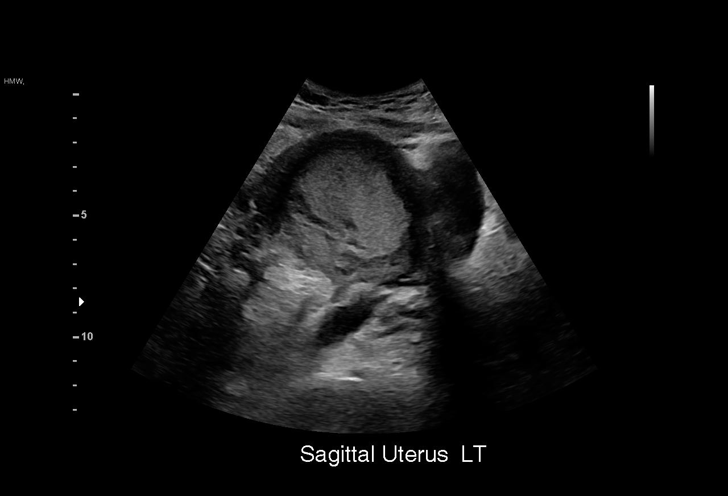
[im 12/40]
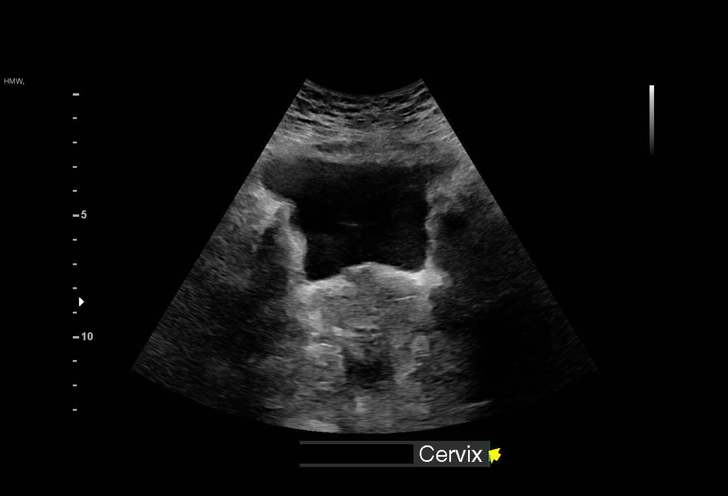
[im 15/40]
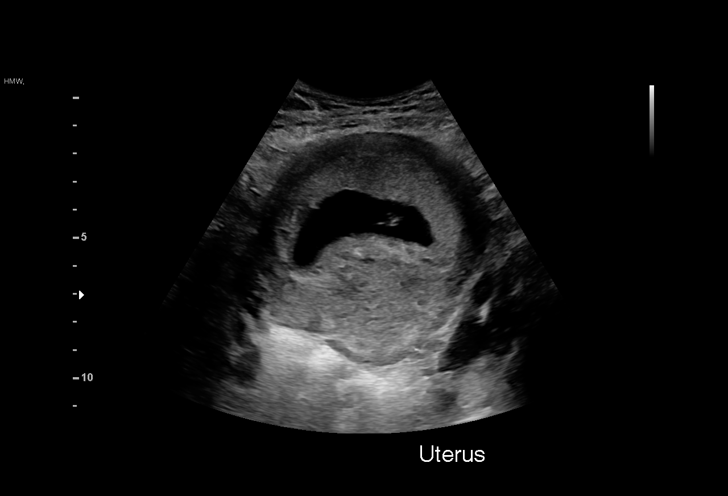
[im 18/40]
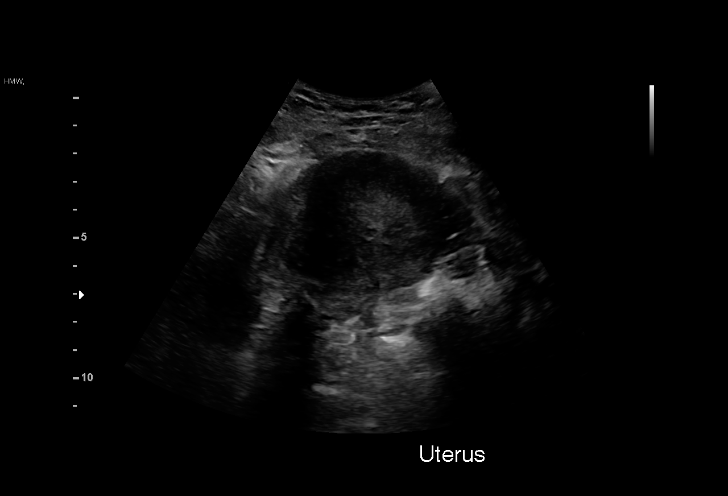
[im 21/40]
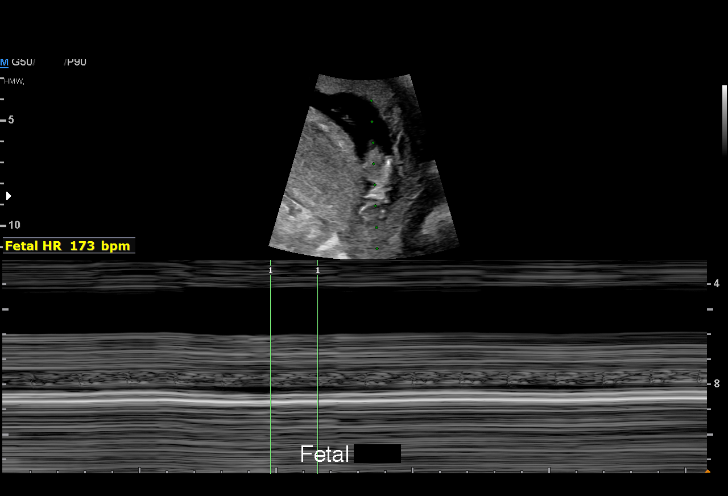
[im 22/40]
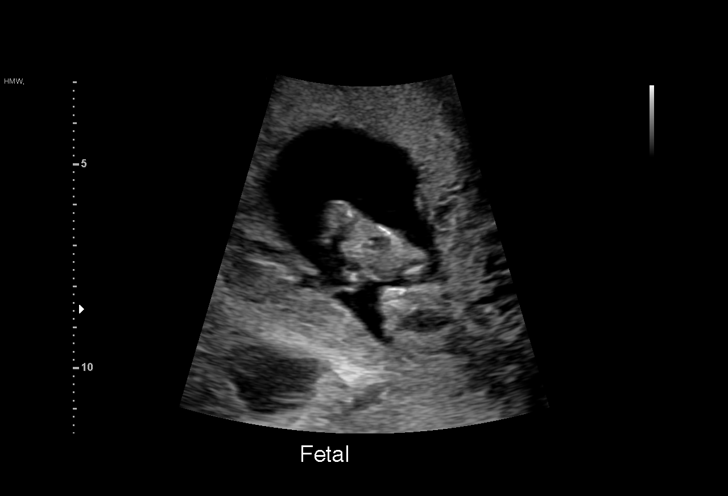
[im 25/40]
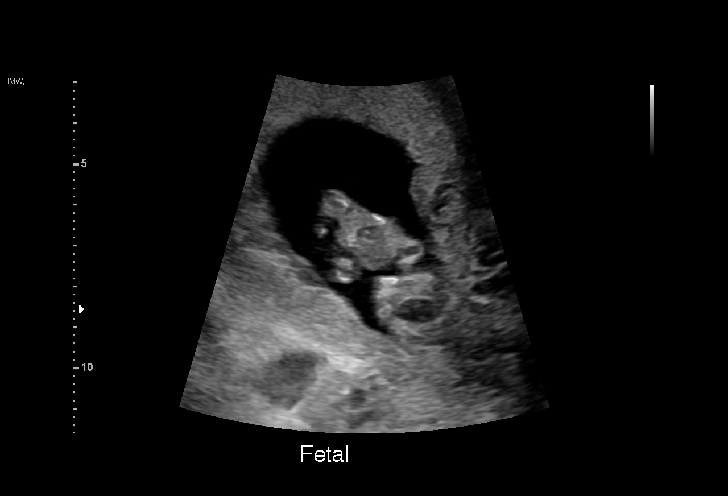
[im 28/40]
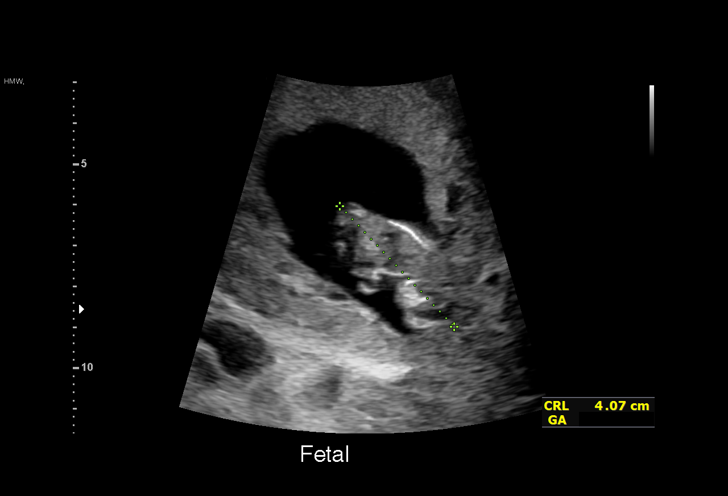
[im 31/40]
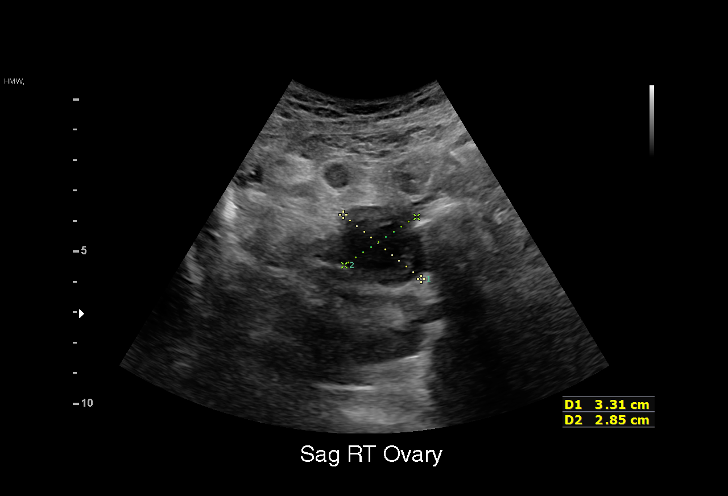
[im 34/40]
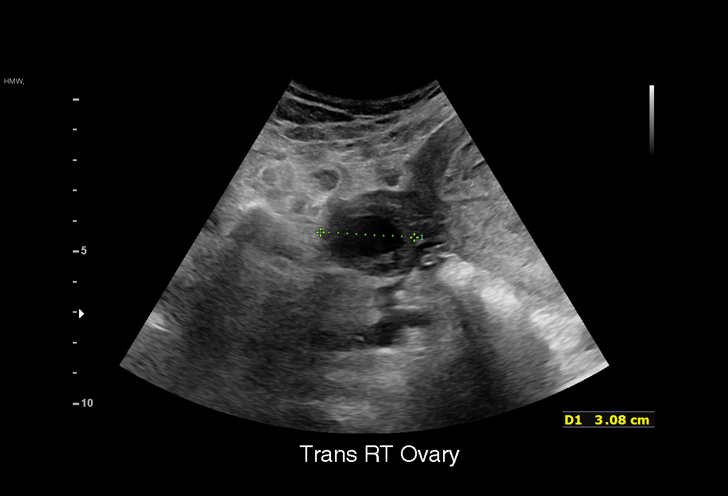
[im 37/40]
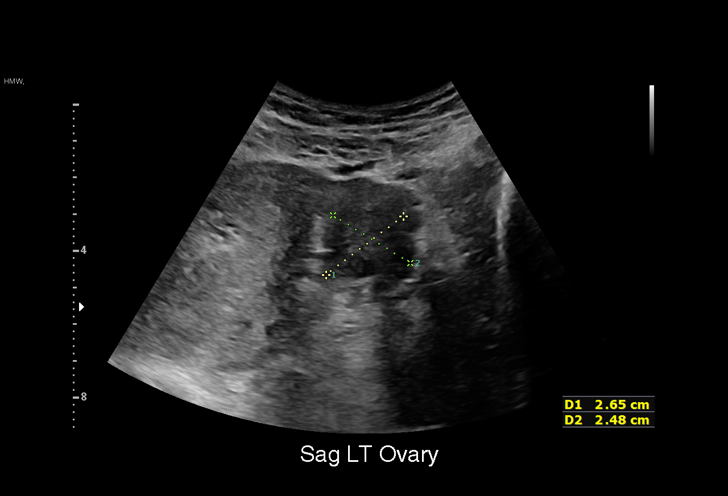
[im 40/40]
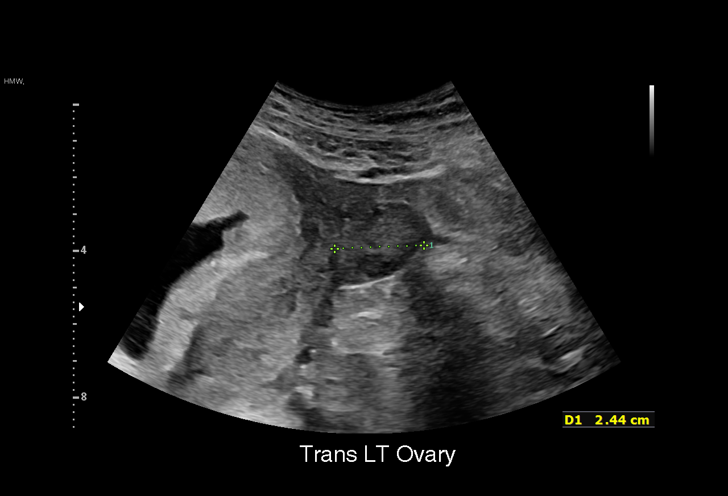

[15 of 28 positions shown; findings below may reference images not displayed]

FINDINGS: Intrauterine gestational sac: Single.

Yolk sac:  Not Visualized.

Embryo:  Visualized.

Cardiac Activity: Visualized.

Heart Rate: 173 bpm

CRL:   44.1 mm   11 w 1 d                  US EDC: [DATE]

Subchorionic hemorrhage:  None visualized.

Maternal uterus/adnexae: Unremarkable.
IMPRESSION: 1. No acute abnormality. Single live intrauterine pregnancy with
estimated gestational age of 11 weeks, 0 days, showing appropriate
interval growth since the prior study.

## 2021-01-20 MED ORDER — METRONIDAZOLE 500 MG PO TABS: 500.0000 mg | ORAL_TABLET | Freq: Two times a day (BID) | ORAL | 0 refills | Status: AC

## 2021-01-20 MED ORDER — HYDROMORPHONE HCL 1 MG/ML IJ SOLN
1.0000 mg | Freq: Once | INTRAMUSCULAR | Status: AC
  Administered 2021-01-20: 1 mg via INTRAMUSCULAR
  Filled 2021-01-20: qty 1

## 2021-01-20 MED ORDER — ACETAMINOPHEN 500 MG PO TABS: 1000.0000 mg | ORAL_TABLET | Freq: Once | ORAL | Status: DC

## 2021-01-20 MED ORDER — HYDROXYZINE HCL 25 MG PO TABS
25.0000 mg | ORAL_TABLET | Freq: Once | ORAL | Status: DC
  Filled 2021-01-20: qty 1

## 2021-01-20 NOTE — MAU Note (Signed)
Denies GI  or GU complaints.  No hx of kidney stones.  Still has appendix.

## 2021-01-20 NOTE — Discharge Instructions (Addendum)
-  Please take your Flagyl medication to treat Bacterial Vaginosis.  -Please follow up with MFM for premature rupture of membranes and repeat ultrasound.  -Your MRI is negative today and your blood work is otherwise normal. There is no concern for appendicities.

## 2021-01-20 NOTE — MAU Note (Signed)
Transport here to take pt to MRI, had not called prior to arrival as they said they would, pt was just taken to [place] (OB), should be back in a few min.  Transport waiting.

## 2021-01-20 NOTE — Progress Notes (Signed)
Per front office staff at check-in patient stated she was leaking fluid and became tearful. This RN brought patient back for check in. Patient denied any vaginal bleeding and stated fluid was clear and watery. This RN obtained fetal heart rate to be 170 bpm. Per patient she is having R sided abdominal pain. This RN notified Dr. Ephriam Jenkins, who came to evaluate patient. Per MD patient is to be sent to MAU for evaluation and new OB visit rescheduled.   Alesia Richards, RN

## 2021-01-20 NOTE — Progress Notes (Addendum)
   Subjective:  Catherine Simmons is a 35 y.o. [id number] at [redacted]w[redacted]d being seen today for prenatal care. She initially presented for new OB visit however appeared and endorsed significant left sided lower abdominal pain and tearful and uncomfortable throughout visit. She also reported increased fluid like discharge.    She is currently monitored for the following issues for this low-risk pregnancy and has Adjustment disorder with depressed mood; CARPAL TUNNEL SYNDROME; Human immunodeficiency virus (HIV) disease (HCC); Anxiety; Chronic hypertension; Marijuana use; Hidradenitis suppurativa; Sickle cell trait (HCC); BMI 40.0-44.9, adult (HCC); HIV affecting pregnancy, antepartum; Fatigue; Iron deficiency anemia; Amenorrhea; Abdominal pain, left lower quadrant; Fatty liver; Menorrhagia with irregular cycle; Supervision of high risk pregnancy, antepartum; Headache; and Influenza A on their problem list.   The following portions of the patient's history were reviewed and updated as appropriate: allergies, current medications, past family history, past medical history, past social history, past surgical history and problem list. Problem list updated.  Objective:   BP: 150/91, HR 95, Fetal heart tone: 170  General:  Appears tearful, also gripping right side lower abdomen at times  Skin: Skin is warm and dry. No rash noted.   Cardiovascular: Normal heart rate noted  Respiratory: Normal respiratory effort  Abdomen: Right sided TTP and guarding  Pelvic:       Attempted speculum exam, no blood in vault. No pooling. Due to redundant vaginal tissue unable to visualize cervix with initial speculum used. Since patient being sent over to MAU and also in worsening side pain second attempt of pelvic exam deferred.         Extremities: Normal range of motion.     Mental Status: Normal mood and affect. Normal behavior. Normal judgment and thought content.    Assessment and Plan:  Pregnancy: [id number] at [redacted]w[redacted]d Right  lower abdominal pain Started last week and acutely worsening over last night/today. Denies bleeding. Has not taken anything for pain. On pelvic exam no bleeding. Has confirmed IUP on 11/22 [place]. Given acute pain and TTP/guarding will send to MAU for further workup and imaging and consider workup for appendicitis. - patient sent to MAU. Called MAU and provided expect to MAU provider Catherine Simmons  2. Increased fluid/vaginal discharge Patient at [redacted] weeks gestation. On speculum exam white thin discharge noted. Suspect vaginal infection Swabs deferred in office as patient being sent into MAU for abdominal pain workup.   - patients Initial OB visit deferred given the above mentioned acute concerns. Will need initial OB visit rescheduled if appropriate  Catherine Mccreedy, MD, MPH OB Fellow, Faculty Practice

## 2021-01-20 NOTE — MAU Note (Signed)
Catherine Simmons is a 35 y.o. at [redacted]w[redacted]d here in MAU reporting: started right sided pain at the office and noticed she might be having some LOF. No bleeding.  Onset of complaint: today  Pain score: 10/10  Vitals:   01/20/21 1115  BP: (!) 147/93  Pulse: 76  Resp: 16  Temp: 98.2 F (36.8 C)  SpO2: 98%     FHT:180  Lab orders placed from triage: UA

## 2021-01-20 NOTE — MAU Provider Note (Addendum)
History    CSN: [id number]  Arrival date and time: 01/20/21 1101   Event Date/Time   First Provider Initiated Contact with Patient 01/20/21 1132     Chief Complaint  Patient presents with   Abdominal Pain   Vaginal Discharge   35 year old [id number] @ 11 weeks presenting with  RLQ abdominal pain that became severe all of the sudden this morning. States she has had this pain, comes and goes, for the last two weeks. Has not taken anything for pain. Describes it as sharp, comes and goes. No vaginal bleeding, discharge or itching. No urinary symptoms. Is [redacted] weeks pregnant and was seen in the office this morning. +FHT in clinic and in triage today. Does report continuous LOF. States she had a speculum exam done in clinic and was told large amounts of discharge. She feels like she is continuing to "urinate on herself".   Has had cholecystectomy but no other surgeries. No N/V, fever, diarrhea, headaches.   Abdominal Pain The current episode started today. The onset quality is sudden. The problem occurs intermittently. The most recent episode lasted 2 weeks. The problem has been rapidly worsening. The pain is located in the RLQ. The pain is at a severity of 10/10. The quality of the pain is sharp. The abdominal pain radiates to the RLQ. Pertinent negatives include no diarrhea, fever, nausea or vomiting. The pain is aggravated by movement and palpation. The pain is relieved by Nothing. She has tried nothing for the symptoms. Her past medical history is significant for abdominal surgery. cholecystectomy  Vaginal Discharge The patient's pertinent negatives include no genital itching, pelvic pain or vaginal discharge. Associated symptoms include abdominal pain. Pertinent negatives include no diarrhea, fever, nausea or vomiting. Her past medical history is significant for an abdominal surgery.   OB History     Gravida  7   Para  5   Term  4   Preterm  1   AB  1   Living  5      SAB      IAB   1   Ectopic      Multiple  0   Live Births  5           Past Medical History:  Diagnosis Date   Anemia    Cholecystitis 07/16/2010   Depression    Genital warts 2004   Headache    Hemorrhoids    HIV (human immunodeficiency virus infection) ([place])    Hx of pelvic inflammatory disease 08/31/2013   And hx of multiple STDs    Hypertension    Ovarian cyst    Pregnancy induced hypertension    Sickle cell trait ([facility])    UTI (urinary tract infection)     Past Surgical History:  Procedure Laterality Date   CHOLECYSTECTOMY  07/19/2010   EXTERNAL CEPHALIC VERSION  [id number]       WISDOM TOOTH EXTRACTION      Family History  Problem Relation Age of Onset   Cancer Mother    Hypertension Mother    Diabetes Mother    Pancreatitis Mother    Hypertension Father    Diabetes Maternal Aunt     Social History   Tobacco Use   Smoking status: Former    Years: 15.00    Types: Cigarettes    Start date: 03/11/2012   Smokeless tobacco: Never  Vaping Use   Vaping Use: Never used  Substance Use Topics   Alcohol use: Not Currently  Comment: occ   Drug use: Not Currently    Frequency: 5.0 times per week    Types: Marijuana    Comment: occ    Allergies:  Allergies  Allergen Reactions   Butorphanol Itching    Tolerates percocet   Stadol [Butorphanol Tartrate] Itching    Tolerates percocet    Medications Prior to Admission  Medication Sig Dispense Refill Last Dose   abacavir-dolutegravir-lamiVUDine (TRIUMEQ) 600-50-300 MG tablet Take 1 tablet by mouth daily. 30 tablet 0 01/19/2021   cyclobenzaprine (FLEXERIL) 10 MG tablet Take 1 tablet (10 mg total) by mouth 3 (three) times daily as needed for muscle spasms (headaches). 30 tablet 0 01/20/2021 at 0300   metoCLOPramide (REGLAN) 10 MG tablet Take 1 tablet (10 mg total) by mouth every 8 (eight) hours as needed for nausea (or headache). 30 tablet 0 01/19/2021   acetaminophen (TYLENOL) 500 MG tablet Take 500-1,000 mg by  mouth every 6 (six) hours as needed for mild pain or headache.      cyclobenzaprine (FLEXERIL) 5 MG tablet Take 1 tablet (5 mg total) by mouth 3 (three) times daily as needed for muscle spasms (or headache). (Patient not taking: Reported on 01/20/2021) 30 tablet 0    labetalol (NORMODYNE) 200 MG tablet Take 1 tablet (200 mg total) by mouth 2 (two) times daily. (Patient not taking: Reported on 01/17/2021) 60 tablet 0    oxyCODONE-acetaminophen (PERCOCET/ROXICET) 5-325 MG tablet Take 1 tablet by mouth every 6 (six) hours as needed for severe pain. 3 tablet 0    Review of Systems  Constitutional:  Negative for fever.  Eyes:  Negative for visual disturbance.  Respiratory:  Negative for shortness of breath.   Cardiovascular:  Negative for chest pain.  Gastrointestinal:  Positive for abdominal pain. Negative for diarrhea, nausea and vomiting.  Endocrine: Negative for polyuria.  Genitourinary:  Negative for pelvic pain and vaginal discharge.  Physical Exam   Blood pressure 136/83, pulse 62, temperature 98.2 F (36.8 C), temperature source Oral, resp. rate 16, height 5\' 2"  (1.575 m), weight 102.2 kg, last menstrual period 09/09/2020, SpO2 98 %, unknown if currently breastfeeding.  Physical Exam Constitutional:      General: She is in acute distress.  Cardiovascular:     Rate and Rhythm: Normal rate.  Pulmonary:     Effort: Pulmonary effort is normal.  Abdominal:     Palpations: Abdomen is soft.     Tenderness: There is abdominal tenderness in the right lower quadrant. There is guarding. Negative signs include Murphy's sign and McBurney's sign.  Genitourinary:    Vagina: Normal.  Skin:    General: Skin is warm.     Findings: No rash.  Neurological:     General: No focal deficit present.     Mental Status: She is alert.  Psychiatric:        Mood and Affect: Mood is anxious.   MAU Course  Procedures Results for orders placed or performed during the hospital encounter of 01/20/21 (from  the past 24 hour(s))  Urinalysis, Routine w reflex microscopic Urine, Clean Catch     Status: Abnormal   Collection Time: 01/20/21 11:16 AM  Result Value Ref Range   Color, Urine YELLOW YELLOW   APPearance CLEAR CLEAR   Specific Gravity, Urine <1.005 (L) 1.005 - 1.030   pH 6.5 5.0 - 8.0   Glucose, UA NEGATIVE NEGATIVE mg/dL   Hgb urine dipstick NEGATIVE NEGATIVE   Bilirubin Urine NEGATIVE NEGATIVE   Ketones, ur NEGATIVE NEGATIVE  mg/dL   Protein, ur NEGATIVE NEGATIVE mg/dL   Nitrite NEGATIVE NEGATIVE   Leukocytes,Ua NEGATIVE NEGATIVE  POCT urinalysis dip (device)     Status: Abnormal   Collection Time: 01/20/21 11:19 AM  Result Value Ref Range   Glucose, UA NEGATIVE NEGATIVE mg/dL   Bilirubin Urine NEGATIVE NEGATIVE   Ketones, ur NEGATIVE NEGATIVE mg/dL   Specific Gravity, Urine 1.020 1.005 - 1.030   Hgb urine dipstick NEGATIVE NEGATIVE   pH 6.5 5.0 - 8.0   Protein, ur NEGATIVE NEGATIVE mg/dL   Urobilinogen, UA 0.2 0.0 - 1.0 mg/dL   Nitrite NEGATIVE NEGATIVE   Leukocytes,Ua TRACE (A) NEGATIVE  CBC     Status: Abnormal   Collection Time: 01/20/21 11:58 AM  Result Value Ref Range   WBC 7.1 4.0 - 10.5 K/uL   RBC 4.20 3.87 - 5.11 MIL/uL   Hemoglobin 11.7 (L) 12.0 - 15.0 g/dL   HCT 33.8 (L) 36.0 - 46.0 %   MCV 80.5 80.0 - 100.0 fL   MCH 27.9 26.0 - 34.0 pg   MCHC 34.6 30.0 - 36.0 g/dL   RDW 11.7 11.5 - 15.5 %   Platelets 183 150 - 400 K/uL   nRBC 0.0 0.0 - 0.2 %  Comprehensive metabolic panel     Status: Abnormal   Collection Time: 01/20/21 11:58 AM  Result Value Ref Range   Sodium 136 135 - 145 mmol/L   Potassium 3.8 3.5 - 5.1 mmol/L   Chloride 105 98 - 111 mmol/L   CO2 23 22 - 32 mmol/L   Glucose, Bld 91 70 - 99 mg/dL   BUN <5 (L) 6 - 20 mg/dL   Creatinine, Ser 0.53 0.44 - 1.00 mg/dL   Calcium 8.7 (L) 8.9 - 10.3 mg/dL   Total Protein 6.1 (L) 6.5 - 8.1 g/dL   Albumin 3.0 (L) 3.5 - 5.0 g/dL   AST 14 (L) 15 - 41 U/L   ALT 19 0 - 44 U/L   Alkaline Phosphatase 50 38 -  126 U/L   Total Bilirubin 0.3 0.3 - 1.2 mg/dL   GFR, Estimated >60 >60 mL/min   Anion gap 8 5 - 15  Wet prep, genital     Status: Abnormal   Collection Time: 01/20/21 12:11 PM   Specimen: PATH Cytology Cervicovaginal Ancillary Only  Result Value Ref Range   Yeast Wet Prep HPF POC NONE SEEN NONE SEEN   Trich, Wet Prep NONE SEEN NONE SEEN   Clue Cells Wet Prep HPF POC PRESENT (A) NONE SEEN   WBC, Wet Prep HPF POC <10 <10   Sperm NONE SEEN   Fern Test     Status: None   Collection Time: 01/20/21  1:20 PM  Result Value Ref Range   POCT Fern Test Positive = ruptured amniotic membanes    MR PELVIS WO CONTRAST  Result Date: 01/20/2021 CLINICAL DATA:  Right lower quadrant abdominal pain in early pregnancy, rule out appendicitis EXAM: MRI ABDOMEN AND PELVIS WITHOUT CONTRAST TECHNIQUE: Multiplanar multisequence MR imaging of the abdomen and pelvis was performed. No intravenous contrast was administered. COMPARISON:  CT abdomen pelvis, 04/29/2020 FINDINGS: COMBINED FINDINGS FOR BOTH MR ABDOMEN AND PELVIS Lower chest: No acute findings. Hepatobiliary: No mass or other parenchymal abnormality identified. Status post cholecystectomy. Mild postoperative biliary ductal dilatation. Pancreas: No mass, inflammatory changes, or other parenchymal abnormality identified. No pancreatic ductal dilatation. Spleen:  Within normal limits in size and appearance. Adrenals/Urinary Tract: No masses identified. No evidence of hydronephrosis. Stomach/Bowel:  Visualized portions within the abdomen are unremarkable. The appendix is difficult to clearly visualize, candidate in the right lower quadrant (e.g. Series 9, image 13) without evidence of dilatation or adjacent inflammatory findings. Vascular/Lymphatic: No pathologically enlarged lymph nodes identified. No abdominal aortic aneurysm demonstrated. Reproductive: Early gestation in the uterus, which is otherwise unremarkable in appearance. Right ovarian cyst measuring 2.0 cm.  The right ovary is normal in size. Normal left ovary with small follicles. Other:  None. Musculoskeletal: No suspicious bone lesions identified. IMPRESSION: 1. The appendix is difficult to clearly visualize, candidate in the right lower quadrant without evidence of dilatation or adjacent inflammatory findings. No MR evidence of appendicitis. 2. Right ovarian cyst measuring 2.0 cm, possibly symptomatic. The ovary is otherwise normal in size and appearance without adjacent inflammatory findings. 3. Early gestation in the uterus, which is otherwise unremarkable in appearance. Electronically Signed   By: Jearld Lesch M.D.   On: 01/20/2021 15:34   MR ABDOMEN WO CONTRAST  Result Date: 01/20/2021 CLINICAL DATA:  Right lower quadrant abdominal pain in early pregnancy, rule out appendicitis EXAM: MRI ABDOMEN AND PELVIS WITHOUT CONTRAST TECHNIQUE: Multiplanar multisequence MR imaging of the abdomen and pelvis was performed. No intravenous contrast was administered. COMPARISON:  CT abdomen pelvis, 04/29/2020 FINDINGS: COMBINED FINDINGS FOR BOTH MR ABDOMEN AND PELVIS Lower chest: No acute findings. Hepatobiliary: No mass or other parenchymal abnormality identified. Status post cholecystectomy. Mild postoperative biliary ductal dilatation. Pancreas: No mass, inflammatory changes, or other parenchymal abnormality identified. No pancreatic ductal dilatation. Spleen:  Within normal limits in size and appearance. Adrenals/Urinary Tract: No masses identified. No evidence of hydronephrosis. Stomach/Bowel: Visualized portions within the abdomen are unremarkable. The appendix is difficult to clearly visualize, candidate in the right lower quadrant (e.g. Series 9, image 13) without evidence of dilatation or adjacent inflammatory findings. Vascular/Lymphatic: No pathologically enlarged lymph nodes identified. No abdominal aortic aneurysm demonstrated. Reproductive: Early gestation in the uterus, which is otherwise unremarkable in  appearance. Right ovarian cyst measuring 2.0 cm. The right ovary is normal in size. Normal left ovary with small follicles. Other:  None. Musculoskeletal: No suspicious bone lesions identified. IMPRESSION: 1. The appendix is difficult to clearly visualize, candidate in the right lower quadrant without evidence of dilatation or adjacent inflammatory findings. No MR evidence of appendicitis. 2. Right ovarian cyst measuring 2.0 cm, possibly symptomatic. The ovary is otherwise normal in size and appearance without adjacent inflammatory findings. 3. Early gestation in the uterus, which is otherwise unremarkable in appearance. Electronically Signed   By: Jearld Lesch M.D.   On: 01/20/2021 15:34   US OB Comp Less 14 Wks  Result Date: 01/20/2021 CLINICAL DATA:  Leaking fluid. Positive FERN test. Estimated gestational age of [redacted] weeks, 0 days by LMP. EXAM: OBSTETRIC <14 WK ULTRASOUND TECHNIQUE: Transabdominal ultrasound was performed for evaluation of the gestation as well as the maternal uterus and adnexal regions. COMPARISON:  December 31, 2020. FINDINGS: Intrauterine gestational sac: Single. Yolk sac:  Not Visualized. Embryo:  Visualized. Cardiac Activity: Visualized. Heart Rate: 173 bpm CRL:   44.1 mm   11 w 1 d                  [place] EDC: 08/10/2021 Subchorionic hemorrhage:  None visualized. Maternal uterus/adnexae: Unremarkable. IMPRESSION: 1. No acute abnormality. Single live intrauterine pregnancy with estimated gestational age of [redacted] weeks, 0 days, showing appropriate interval growth since the prior study. Electronically Signed   By: Obie Dredge M.D.   On: 01/20/2021 14:38  MDM Patient with +rebound tenderness and guarding in RLQ. CBC normal. Concerned for appendicitis however MR abdomen/pelvis negative for acute appendicits. +Clue cells on wet prep. Will treat with flagyl. Complaining of continuous leak of fluid. Does have + fern. Sent for transvaginal ultrasound which showed 11 week IUP with normal  interval growth. SVE done today, cervix closed. Given Dilaudid IM with improvement of pain.   Assessment and Plan   Abdominal Pain -MRI negative for appendicitis. Normal WBC. Pain likely due to BV vs early miscarriage.  -Tylenol for pain.  PPROM Threatened Miscarriage  -Positive fern test in MAU today. Reports continued leakage of fluid. On ultrasound does appear to have good level of fluid along with appropriate interval growth.  -Will follow up with MFM for interval ultrasound and eval of PPROM. - +FHT today in triage, SVE with a closed cervix. Has some vaginal spotting, and fetus appears to be low lying on ultrasound and SVE. Possibility of early miscarriage developing.  -Follow up with MFM.  -Return precautions discussed such as fever, increased abdominal pain,increased bleeding.  Bacterial Vaginosis -Will treat with Flagyl for 7 days.   Layla Barter M.D., PGY-2 01/20/2021, 11:54 AM    Attestation of Supervision of Student:  I confirm that I have verified the information documented in the  resident's  note and that I have also personally performed the history, physical exam and all medical decision making activities.  I have verified that all services and findings are accurately documented in this student's note; and I agree with management and plan as outlined in the documentation. I have also made any necessary editorial changes.  History Catherine Simmons is a 35 y.o. [id number] at [redacted]w[redacted]d who presents from the  office for evaluation of abdominal pain. Reports RLQ abdominal pain for the last few weeks that significantly worsened this morning. Rates pain 10/10. Hasn't treated symptoms. No aggravating factors. Denies n/v, fever/chills, flank pain, dysuria, vaginal bleeding.  Also reports watery discharge since this morning.   Physical exam Patient Vitals for the past 24 hrs:  BP Temp Temp src Pulse Resp SpO2 Height Weight  01/20/21 1648 136/83 -- -- 62 -- -- -- --  01/20/21 1115  (!) 147/93 98.2 F (36.8 C) Oral 76 16 98 % -- --  01/20/21 1110 -- -- -- -- -- -- 5\' 2"  (1.575 m) 102.2 kg    Physical Examination: General appearance - alert, well appearing, and in no distress Mental status - normal mood, behavior, speech, dress, motor activity, and thought processes Eyes - sclera anicteric Chest - normal respiratory effort Abdomen - soft. TTP in RLQ with rebound.  Pelvic - Thin clear discharge, no pooling. Scant amount of dark red blood. Cervix closed.  Skin - warm & dry   Assessment/Plan 1. Threatened miscarriage  -starting to bleed while in MAU but stable. Ultrasound shows IUP with appropriate growth. Cervix is closed  2. Amniotic fluid leaking  -pt had episode of watery discharge while in MAU that had positive fern. Good amount of fluid seen on ultrasound. Discussed PPROM with patient. Suspect that she may miscarry based on bleeding, pain, & exam. If doesn't, PPROM at this early gestational age will likely end in loss of pregnancy or infection. This is a desired pregnancy. Will get her follow up with MFM. -Pelvic rest & infection precautions  3. [redacted] weeks gestation of pregnancy   4. Abdominal pain during pregnancy in first trimester   5. Right lower quadrant abdominal pain  -afebrile &  no leukocytosis -normal MRI  6. Bacterial vaginosis  -Rx flagyl -GC/CT pending     Jorje Guild, NP [facility] 01/20/2021 5:11 PM

## 2021-01-20 NOTE — Progress Notes (Signed)
Opened in error

## 2021-01-20 NOTE — MAU Note (Signed)
Pt calling for ride.  Reinforced:reasons to return.  Keep Neuro appt on 12/14, MFM will be calling.

## 2021-01-21 LAB — GC/CHLAMYDIA PROBE AMP (~~LOC~~) NOT AT ARMC
Chlamydia: NEGATIVE
Comment: NEGATIVE
Comment: NORMAL
Neisseria Gonorrhea: NEGATIVE

## 2021-01-21 LAB — CSF CULTURE W GRAM STAIN: Culture: NO GROWTH

## 2021-01-22 ENCOUNTER — Other Ambulatory Visit: Payer: Self-pay | Admitting: General Practice

## 2021-01-22 ENCOUNTER — Telehealth: Payer: Self-pay | Admitting: Neurology

## 2021-01-22 ENCOUNTER — Ambulatory Visit (INDEPENDENT_AMBULATORY_CARE_PROVIDER_SITE_OTHER): Payer: MEDICAID | Admitting: Neurology

## 2021-01-22 ENCOUNTER — Encounter: Payer: Self-pay | Admitting: Neurology

## 2021-01-22 ENCOUNTER — Other Ambulatory Visit: Payer: Self-pay

## 2021-01-22 ENCOUNTER — Ambulatory Visit: Payer: Self-pay

## 2021-01-22 DIAGNOSIS — Z91199 Patient's noncompliance with other medical treatment and regimen due to unspecified reason: Secondary | ICD-10-CM

## 2021-01-22 DIAGNOSIS — O42919 Preterm premature rupture of membranes, unspecified as to length of time between rupture and onset of labor, unspecified trimester: Secondary | ICD-10-CM

## 2021-01-22 NOTE — Progress Notes (Signed)
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 She has a 17% no show rate in Epic. but if she calls she can be rescheduled since this is her first no show to [facility] but please ensure she understands that she will be dismissed permanently if she no shows again and that we have many patients that could have used this appointment slot.   Also please if referring back to neuro ensure there is need - if patient's headache has resolved (and she had an LP and thorough workup inpatient) then maybe she does not ned to be seen in neuro. If there is need, we are happy to see her. thanks

## 2021-01-22 NOTE — Telephone Encounter (Signed)
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 She has a 17% no show rate but if she calls she can be rescheduled this is her first no show to [facility] but please ensure she understands that she will be dismissed if no shows again and that we have many patients that could have used this appointment slot.

## 2021-01-24 ENCOUNTER — Ambulatory Visit: Payer: Self-pay | Admitting: Infectious Diseases

## 2021-01-27 ENCOUNTER — Encounter: Payer: Self-pay | Admitting: Obstetrics and Gynecology

## 2021-01-29 ENCOUNTER — Telehealth: Payer: Self-pay

## 2021-01-29 ENCOUNTER — Other Ambulatory Visit: Payer: Self-pay | Admitting: Obstetrics and Gynecology

## 2021-01-29 ENCOUNTER — Inpatient Hospital Stay (HOSPITAL_COMMUNITY)
Admission: AD | Admit: 2021-01-29 | Discharge: 2021-01-29 | Disposition: A | Discharge status: Home / Self Care | Payer: Medicaid Other | Attending: Obstetrics & Gynecology | Admitting: Obstetrics & Gynecology

## 2021-01-29 ENCOUNTER — Encounter (HOSPITAL_COMMUNITY): Payer: Self-pay | Admitting: Obstetrics and Gynecology

## 2021-01-29 ENCOUNTER — Inpatient Hospital Stay (HOSPITAL_COMMUNITY): Payer: Medicaid Other

## 2021-01-29 ENCOUNTER — Other Ambulatory Visit: Payer: Self-pay

## 2021-01-29 DIAGNOSIS — R519 Headache, unspecified: Secondary | ICD-10-CM

## 2021-01-29 DIAGNOSIS — O209 Hemorrhage in early pregnancy, unspecified: Secondary | ICD-10-CM | POA: Insufficient documentation

## 2021-01-29 DIAGNOSIS — O10011 Pre-existing essential hypertension complicating pregnancy, first trimester: Secondary | ICD-10-CM | POA: Diagnosis not present

## 2021-01-29 DIAGNOSIS — R109 Unspecified abdominal pain: Secondary | ICD-10-CM | POA: Insufficient documentation

## 2021-01-29 DIAGNOSIS — O0990 Supervision of high risk pregnancy, unspecified, unspecified trimester: Secondary | ICD-10-CM

## 2021-01-29 DIAGNOSIS — Z87891 Personal history of nicotine dependence: Secondary | ICD-10-CM | POA: Insufficient documentation

## 2021-01-29 DIAGNOSIS — I10 Essential (primary) hypertension: Secondary | ICD-10-CM | POA: Diagnosis not present

## 2021-01-29 DIAGNOSIS — O26891 Other specified pregnancy related conditions, first trimester: Secondary | ICD-10-CM | POA: Insufficient documentation

## 2021-01-29 DIAGNOSIS — O42911 Preterm premature rupture of membranes, unspecified as to length of time between rupture and onset of labor, first trimester: Secondary | ICD-10-CM | POA: Insufficient documentation

## 2021-01-29 DIAGNOSIS — O021 Missed abortion: Secondary | ICD-10-CM

## 2021-01-29 DIAGNOSIS — Z3A12 12 weeks gestation of pregnancy: Secondary | ICD-10-CM | POA: Insufficient documentation

## 2021-01-29 DIAGNOSIS — O4290 Premature rupture of membranes, unspecified as to length of time between rupture and onset of labor, unspecified weeks of gestation: Secondary | ICD-10-CM

## 2021-01-29 IMAGING — US US OB COMP LESS 14 WK
1 series · 15 of 28 positions shown · non-contrast
Comparison: [DATE]

CLINICAL DATA: Vaginal bleeding and leaking amniotic fluid.

EXAM:
OBSTETRIC <14 WK ULTRASOUND
TECHNIQUE: Transabdominal ultrasound was performed for evaluation of the
gestation as well as the maternal uterus and adnexal regions.

[Series 1: us ob comp less 14 wk · 47 acquisitions, 15 frames shown]
[im 1/47]
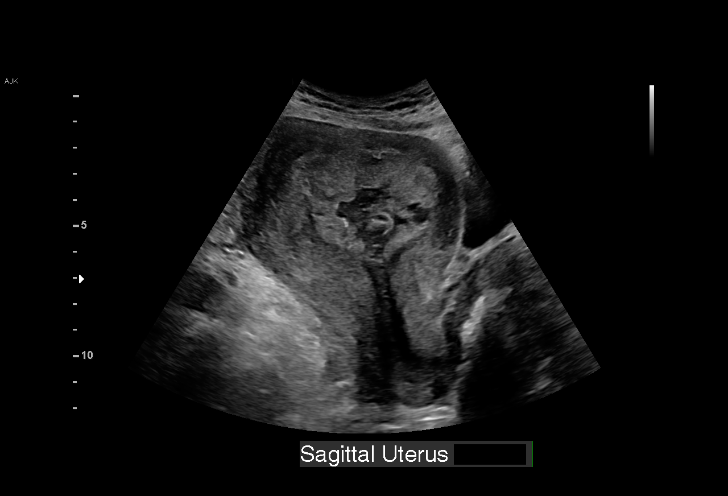
[im 4/47]
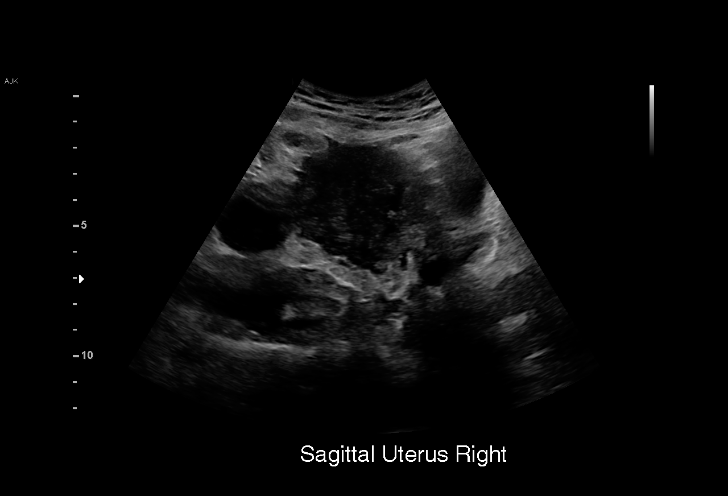
[im 7/47]
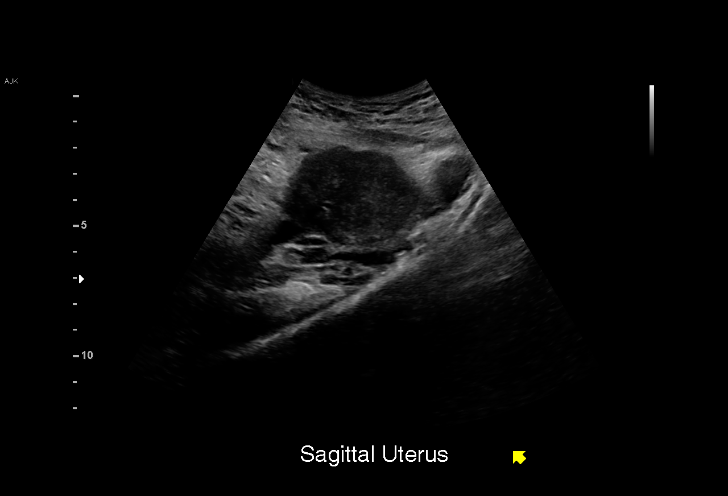
[im 11/47]
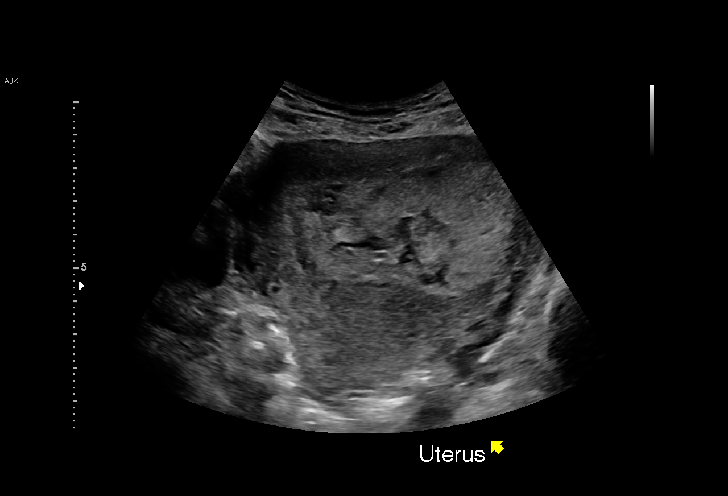
[im 14/47]
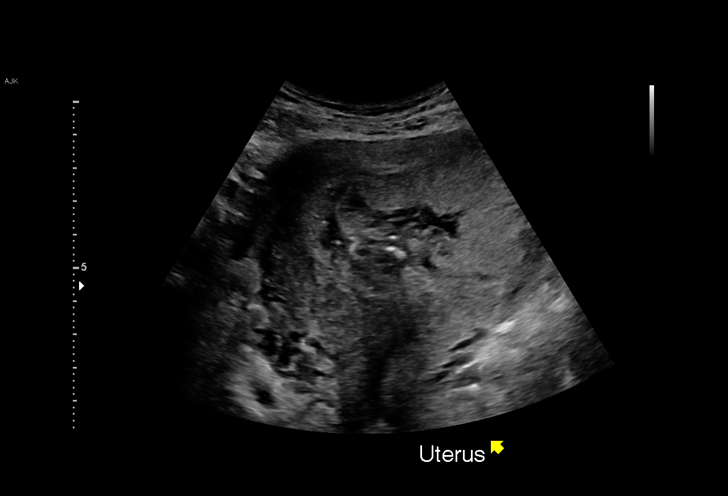
[im 18/47]
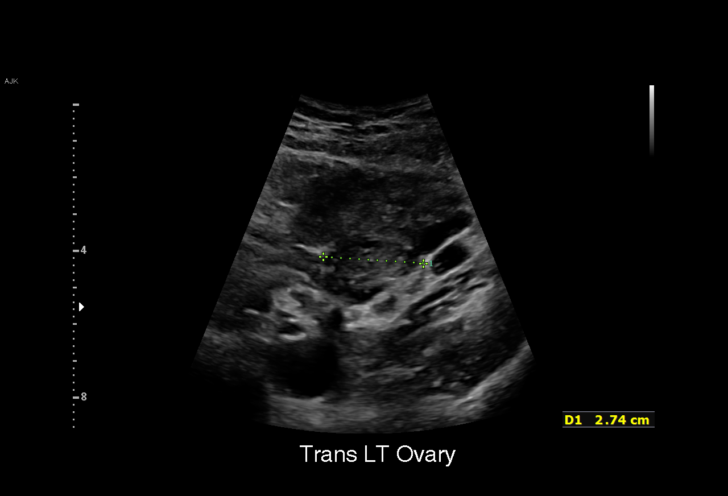
[im 21/47]
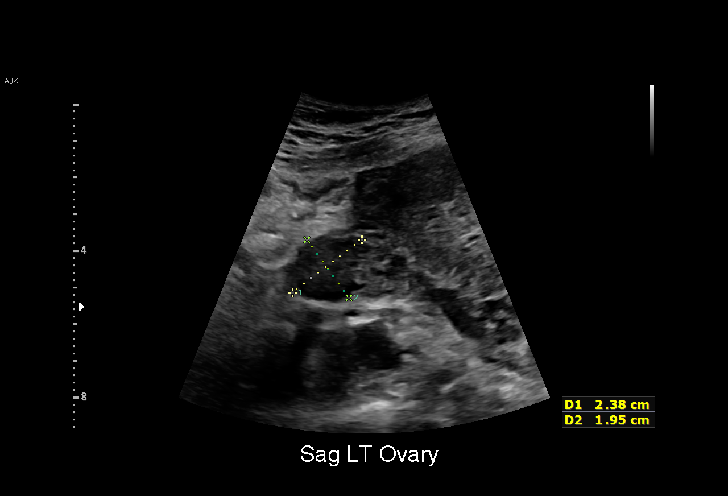
[im 24/47]
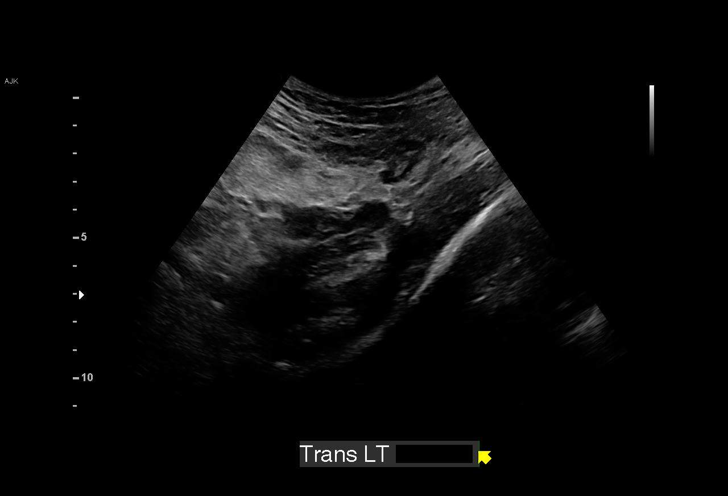
[im 26/47]
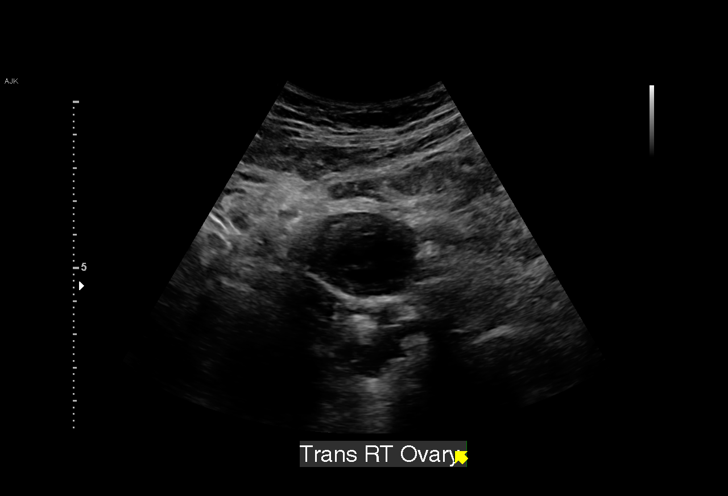
[im 29/47]
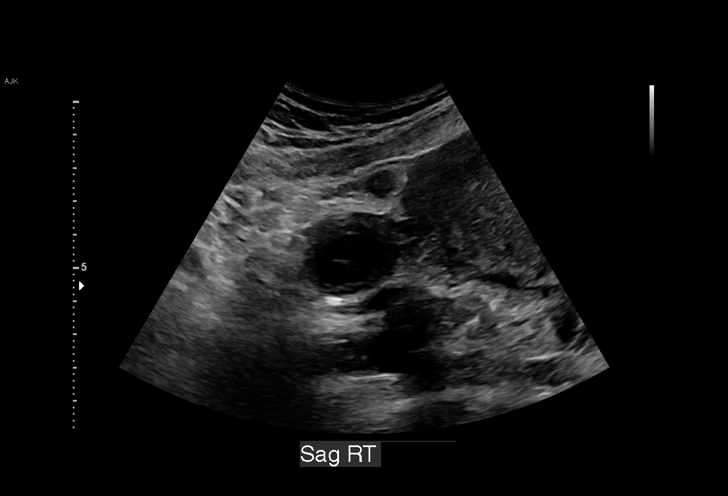
[im 33/47]
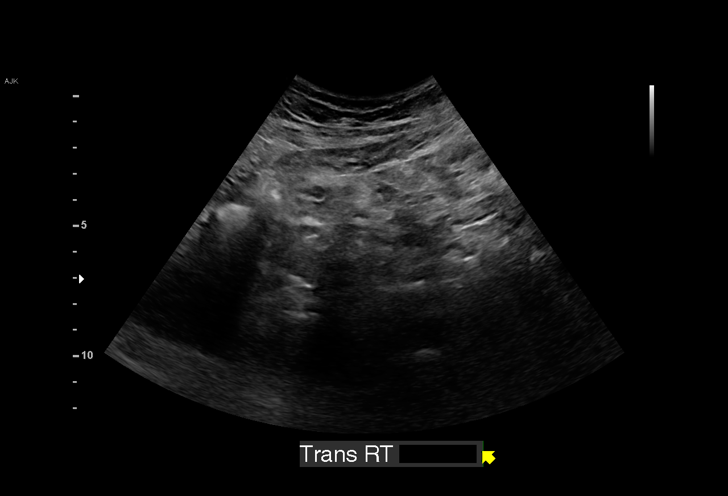
[im 36/47]
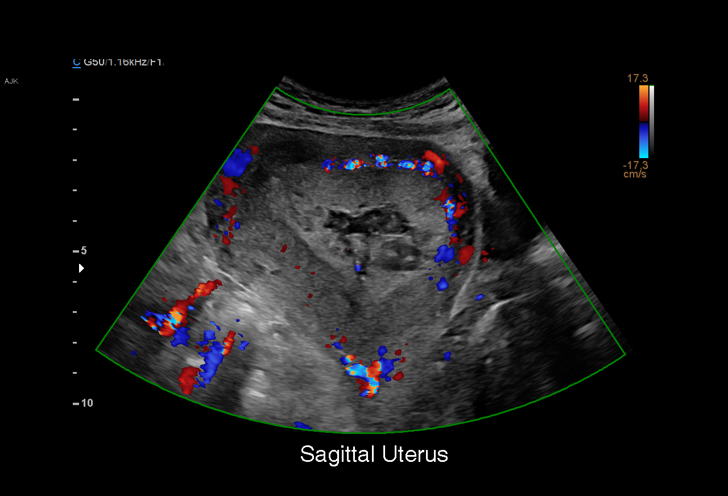
[im 40/47]
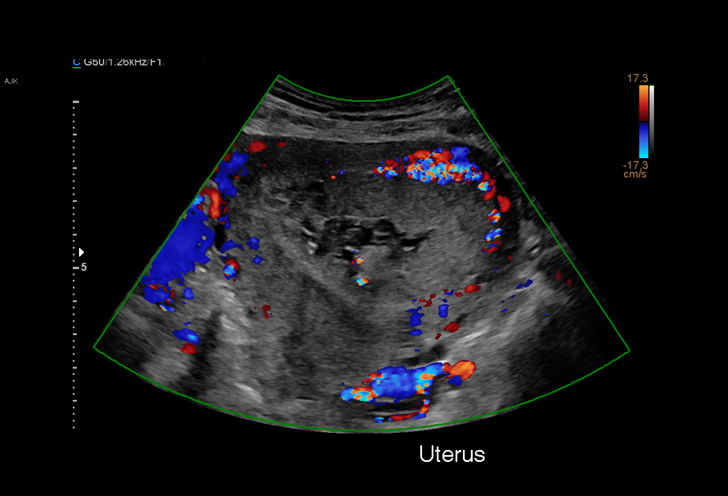
[im 43/47]
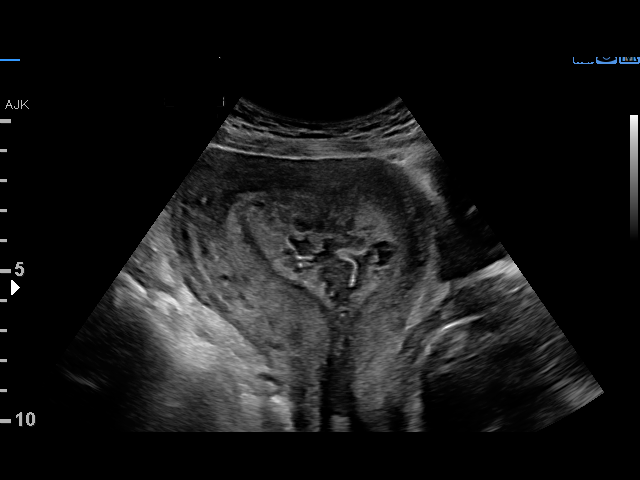
[im 47/47]
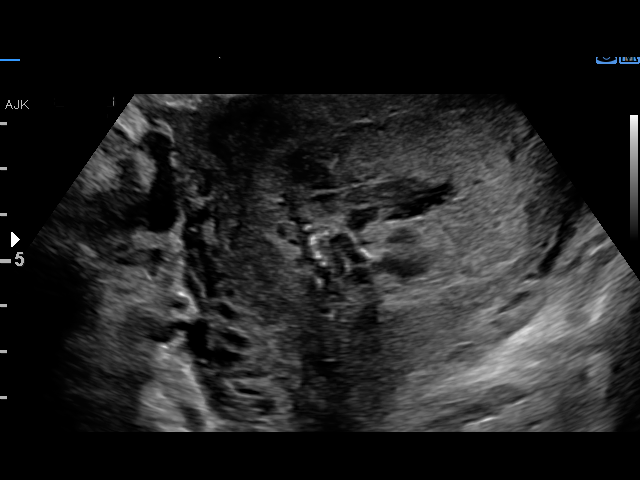

[15 of 28 positions shown; findings below may reference images not displayed]

FINDINGS: Intrauterine gestational sac: Single

Yolk sac:  Not Visualized.

Embryo:  Visualized.

Cardiac Activity: Not Visualized.

Heart Rate: N/A bpm Fetal heart motion is seen on the prior study
(173 beats per minute).

A heterogeneous area is seen within the upper uterus and appears to
represent an involuting gestational sac which contains what appears
to be fetal structures.

Subchorionic hemorrhage:  None visualized.

Maternal uterus/adnexae: The bilateral ovaries are visualized and
are normal in appearance.

No pelvic free fluid is identified.
IMPRESSION: Findings meet definitive criteria for failed pregnancy. This follows
SRU consensus guidelines: Diagnostic Criteria for Nonviable
Pregnancy Early in the First Trimester. N Engl J Med

## 2021-01-29 MED ORDER — PROMETHAZINE HCL 25 MG PO TABS: 25.0000 mg | ORAL_TABLET | Freq: Once | ORAL | Status: DC

## 2021-01-29 MED ORDER — LABETALOL HCL 100 MG PO TABS
200.0000 mg | ORAL_TABLET | Freq: Once | ORAL | Status: AC
  Administered 2021-01-29: 03:00:00 200 mg via ORAL
  Filled 2021-01-29: qty 2

## 2021-01-29 MED ORDER — CYCLOBENZAPRINE HCL 5 MG PO TABS: 10.0000 mg | ORAL_TABLET | Freq: Once | ORAL | Status: DC

## 2021-01-29 NOTE — MAU Provider Note (Signed)
History     CSN: [id number]  Arrival date and time: 01/29/21 0206   Event Date/Time   First Provider Initiated Contact with Patient 01/29/21 0252      Chief Complaint  Patient presents with   Vaginal Bleeding   Headache   Abdominal Cramping   Catherine Simmons is a 35 y.o. [id number] at [redacted]w[redacted]d who receives care at [facility].  She presents today for Vaginal Bleeding and Abdominal Cramping.  She states she started having bleeding yesterday that is bright red and with some small "string" clots.  She reports she also has cramping that started after the bleeding.  She states it is intermittent and located in her lower abdomen.  She rates the pain a 5/10.  Patient reports sexual activity on Saturday.  She also reports HA that started around 1am.  She states it is located "all over" and describes it as a "stabbing pain."  She states lights worsen her headache, but have found no relieving factors.  She rates her HA a 8/10.     OB History     Gravida  7   Para  5   Term  4   Preterm  1   AB  1   Living  5      SAB      IAB  1   Ectopic      Multiple  0   Live Births  5           Past Medical History:  Diagnosis Date   Anemia    Cholecystitis 07/16/2010   Depression    Genital warts 2004   Headache    Hemorrhoids    HIV (human immunodeficiency virus infection) ([place])    Hx of pelvic inflammatory disease 08/31/2013   And hx of multiple STDs    Hypertension    Ovarian cyst    Pregnancy induced hypertension    Sickle cell trait (HCC)    UTI (urinary tract infection)     Past Surgical History:  Procedure Laterality Date   CHOLECYSTECTOMY  07/19/2010   EXTERNAL CEPHALIC VERSION  [id number]       WISDOM TOOTH EXTRACTION      Family History  Problem Relation Age of Onset   Cancer Mother    Hypertension Mother    Diabetes Mother    Pancreatitis Mother    Hypertension Father    Diabetes Maternal Aunt     Social History   Tobacco Use   Smoking status:  Former    Years: 15.00    Types: Cigarettes    Start date: 03/11/2012   Smokeless tobacco: Never  Vaping Use   Vaping Use: Never used  Substance Use Topics   Alcohol use: Not Currently    Comment: occ   Drug use: Not Currently    Frequency: 5.0 times per week    Types: Marijuana    Comment: occ    Allergies:  Allergies  Allergen Reactions   Butorphanol Itching    Tolerates percocet   Stadol [Butorphanol Tartrate] Itching    Tolerates percocet    Medications Prior to Admission  Medication Sig Dispense Refill Last Dose   abacavir-dolutegravir-lamiVUDine (TRIUMEQ) 600-50-300 MG tablet Take 1 tablet by mouth daily. 30 tablet 0    acetaminophen (TYLENOL) 500 MG tablet Take 500-1,000 mg by mouth every 6 (six) hours as needed for mild pain or headache.      cyclobenzaprine (FLEXERIL) 10 MG tablet Take 1 tablet (10 mg total) by  mouth 3 (three) times daily as needed for muscle spasms (headaches). 30 tablet 0    cyclobenzaprine (FLEXERIL) 5 MG tablet Take 1 tablet (5 mg total) by mouth 3 (three) times daily as needed for muscle spasms (or headache). (Patient not taking: Reported on 01/20/2021) 30 tablet 0    metoCLOPramide (REGLAN) 10 MG tablet Take 1 tablet (10 mg total) by mouth every 8 (eight) hours as needed for nausea (or headache). 30 tablet 0     Review of Systems  Eyes:  Positive for visual disturbance (spots).  Neurological:  Positive for dizziness and headaches. Negative for light-headedness.  Physical Exam   Blood pressure (!) 132/93, pulse 85, temperature 98.2 F (36.8 C), temperature source Oral, resp. rate 20, height 5\' 2"  (1.575 m), weight 100 kg, last menstrual period 09/09/2020, unknown if currently breastfeeding.  Physical Exam Vitals reviewed. Exam conducted with a chaperone present.  Constitutional:      General: She is not in acute distress.    Appearance: She is well-developed. She is not toxic-appearing.  HENT:     Head: Normocephalic and atraumatic.   Eyes:     Conjunctiva/sclera: Conjunctivae normal.  Cardiovascular:     Rate and Rhythm: Normal rate and regular rhythm.     Heart sounds: Normal heart sounds.  Pulmonary:     Effort: No respiratory distress.     Breath sounds: Normal breath sounds.  Abdominal:     General: There is no distension.     Palpations: Abdomen is soft.     Tenderness: There is no abdominal tenderness.  Genitourinary:    Comments: Speculum Exam: -Normal External Genitalia: Non tender, no apparent discharge at introitus.  -Vaginal Vault: Pink mucosa. Small amt pinkish red blood. Removed with faux swab x   -Cervix:Difficult to completely visualize d/t vaginal wall prolapse. Pink, no lesions, cysts, or polyps.  Appears closed. No active bleeding from os -Bimanual Exam:  Deferred Musculoskeletal:        General: Normal range of motion.     Cervical back: Normal range of motion.  Skin:    General: Skin is warm and dry.  Neurological:     Mental Status: She is alert and oriented to person, place, and time.  Psychiatric:        Mood and Affect: Mood normal.        Behavior: Behavior normal.    MAU Course  Procedures No results found for this or any previous visit (from the past 24 hour(s)).  US OB Comp Less 14 Wks  Result Date: 01/29/2021 CLINICAL DATA:  Vaginal bleeding and leaking amniotic fluid. EXAM: OBSTETRIC <14 WK ULTRASOUND TECHNIQUE: Transabdominal ultrasound was performed for evaluation of the gestation as well as the maternal uterus and adnexal regions. COMPARISON:  January 20, 2021 FINDINGS: Intrauterine gestational sac: Single Yolk sac:  Not Visualized. Embryo:  Visualized. Cardiac Activity: Not Visualized. Heart Rate: N/A bpm Fetal heart motion is seen on the prior study (173 beats per minute). A heterogeneous area is seen within the upper uterus and appears to represent an involuting gestational sac which contains what appears to be fetal structures. Subchorionic hemorrhage:  None visualized.  Maternal uterus/adnexae: The bilateral ovaries are visualized and are normal in appearance. No pelvic free fluid is identified. IMPRESSION: Findings meet definitive criteria for failed pregnancy. This follows SRU consensus guidelines: Diagnostic Criteria for Nonviable Pregnancy Early in the First Trimester. Alison Stalling J Med [phone]. Electronically Signed   By: Virgina Norfolk M.D.   On:  01/29/2021 03:55    MDM [place] Antihypertensive Consult Assessment and Plan  35 year old, [id number]  SIUP at 12.2 weeks Known PPROM Headache-Chronic Chronic HTN  -Reviewed POC with patient. -Extensive discussion regarding potential demise of pregnancy in setting of known PPROM. -Exam performed and findings discussed.  -Will send for [place] -Discussed treatment for c/o HA.  Encouraged to follow up with neurology per plan. -Patient reports scheduled for appt next week, but not reflective in chart. -Discussed elevated blood pressure and need to resume labetalol dosing.   -Will give dose of 200mg  now and patient to continue.  -Will give phenergan and flexeril for c/o HA after completion of .   -Patient agreeable and reports she did not drive.  [place] 01/29/2021, 2:53 AM   Reassessment (4:05 AM)  -01/31/2021 results as above. -Imaging reviewed by provider. -Dr. [place]. Anyanwu consulted and informed of patient status, evaluation, interventions, and results. Advised: *Schedule for D&E -Provider to bedside to discuss results with patient.  -Condolences given. -Patient appropriately upset.  -Discussed MD recommendation for D&E.  Patient agreeable. -Informed that medication for HA to be given.  Patient declines and requests discharge. -Reviewed symptoms associated with spontaneous abortion including onset of bleeding and cramping.  Return precautions reviewed. -Instructed to continue her Labetalol dosing as previously prescribed.  -Message sent to J.Battle to schedule accordingly. -Discharged to home in  stable condition.   [name] Lunch MSN, CNM Advanced Practice Provider, [facility]

## 2021-01-29 NOTE — MAU Note (Signed)
SAYS WAS LEAKING FLUID LAST VISIT   SAYS SHE HAS BEEN BLEEDING ALL DAY  PAD ON IN TRIAGE -SMALL AMT DARK RED BLOOD. MILD CRAMPS - STARTED 8 PM  H/A STARTED 0130- NO MEDS - HAS HAD H/A X3 WEEKS  LAST WEEK WAS HERE FOR H/A- PROCEDURE HELPED

## 2021-01-29 NOTE — Progress Notes (Signed)
DUE TO COVID-19 ONLY ONE VISITOR IS ALLOWED TO COME WITH YOU AND STAY IN THE WAITING ROOM ONLY DURING PRE OP AND PROCEDURE DAY OF SURGERY.   PCP - [facility] Cardiologist - n/a Infectious Disease - Jeanine Luz, FNP  Chest x-ray - n/a EKG - n/a Stress Test - n/a ECHO - n/a Cardiac Cath - n/a  ICD Pacemaker/Loop - n/a  Sleep Study -  n/a CPAP - none  ERAS: Clear liquids til 12 Noon DOS  STOP now taking any Aspirin (unless otherwise instructed by your surgeon), Aleve, Naproxen, Ibuprofen, Motrin, Advil, Goody's, BC's, all herbal medications, fish oil, and all vitamins.   Coronavirus Screening Covid test n/a Ambulatory Surgery  Do you have any of the following symptoms:  Cough yes/no: No Fever (>100.30F)  yes/no: No Runny nose yes/no: No Sore throat yes/no: No Difficulty breathing/shortness of breath  yes/no: No  Have you traveled in the last 14 days and where? yes/no: No  Patient verbalized understanding of instructions that were given via phone.

## 2021-01-29 NOTE — Telephone Encounter (Signed)
Called patient to notify her of surgery date and time, no answer, unable to leave message, voicemail box not set up

## 2021-01-30 ENCOUNTER — Ambulatory Visit (HOSPITAL_COMMUNITY)
Admission: RE | Admit: 2021-01-30 | Discharge: 2021-01-30 | Disposition: A | Discharge status: Home / Self Care | Payer: Medicaid Other | Attending: Obstetrics and Gynecology | Admitting: Obstetrics and Gynecology

## 2021-01-30 ENCOUNTER — Ambulatory Visit (HOSPITAL_COMMUNITY): Payer: Medicaid Other | Admitting: Anesthesiology

## 2021-01-30 ENCOUNTER — Encounter (HOSPITAL_COMMUNITY)
Admission: RE | Disposition: A | Discharge status: Home / Self Care | Payer: Self-pay | Source: Home / Self Care | Attending: Obstetrics and Gynecology

## 2021-01-30 ENCOUNTER — Encounter: Payer: Medicaid Other | Admitting: Obstetrics and Gynecology

## 2021-01-30 ENCOUNTER — Other Ambulatory Visit: Payer: Self-pay

## 2021-01-30 DIAGNOSIS — O021 Missed abortion: Secondary | ICD-10-CM | POA: Diagnosis present

## 2021-01-30 DIAGNOSIS — F17211 Nicotine dependence, cigarettes, in remission: Secondary | ICD-10-CM | POA: Diagnosis not present

## 2021-01-30 HISTORY — PX: DILATION AND EVACUATION: SHX1459

## 2021-01-30 LAB — TYPE AND SCREEN
ABO/RH(D): A POS
Antibody Screen: NEGATIVE

## 2021-01-30 SURGERY — DILATION AND EVACUATION, UTERUS
Anesthesia: General

## 2021-01-30 MED ORDER — LACTATED RINGERS IV SOLN: INTRAVENOUS | Status: DC

## 2021-01-30 MED ORDER — DEXAMETHASONE SODIUM PHOSPHATE 10 MG/ML IJ SOLN
INTRAMUSCULAR | Status: DC | PRN
  Administered 2021-01-30: 10 mg via INTRAVENOUS

## 2021-01-30 MED ORDER — KETOROLAC TROMETHAMINE 30 MG/ML IJ SOLN
INTRAMUSCULAR | Status: DC | PRN
  Administered 2021-01-30: 30 mg via INTRAVENOUS

## 2021-01-30 MED ORDER — PROPOFOL 10 MG/ML IV BOLUS
INTRAVENOUS | Status: DC | PRN
  Administered 2021-01-30: 200 mg via INTRAVENOUS

## 2021-01-30 MED ORDER — IBUPROFEN 600 MG PO TABS: 600.0000 mg | ORAL_TABLET | Freq: Four times a day (QID) | ORAL | 3 refills | Status: DC | PRN

## 2021-01-30 MED ORDER — CHLOROPROCAINE HCL 1 % IJ SOLN
INTRAMUSCULAR | Status: AC
  Filled 2021-01-30: qty 30

## 2021-01-30 MED ORDER — FENTANYL CITRATE (PF) 250 MCG/5ML IJ SOLN
INTRAMUSCULAR | Status: DC | PRN
  Administered 2021-01-30 (×2): 25 ug via INTRAVENOUS

## 2021-01-30 MED ORDER — MIDAZOLAM HCL 2 MG/2ML IJ SOLN
INTRAMUSCULAR | Status: DC | PRN
  Administered 2021-01-30: 2 mg via INTRAVENOUS

## 2021-01-30 MED ORDER — KETOROLAC TROMETHAMINE 30 MG/ML IJ SOLN
INTRAMUSCULAR | Status: AC
  Filled 2021-01-30: qty 1

## 2021-01-30 MED ORDER — CHLORHEXIDINE GLUCONATE 0.12 % MT SOLN
15.0000 mL | Freq: Once | OROMUCOSAL | Status: AC
  Administered 2021-01-30: 13:00:00 15 mL via OROMUCOSAL
  Filled 2021-01-30: qty 15

## 2021-01-30 MED ORDER — ONDANSETRON HCL 4 MG/2ML IJ SOLN
INTRAMUSCULAR | Status: AC
  Filled 2021-01-30: qty 6

## 2021-01-30 MED ORDER — ORAL CARE MOUTH RINSE: 15.0000 mL | Freq: Once | OROMUCOSAL | Status: AC

## 2021-01-30 MED ORDER — ACETAMINOPHEN 500 MG PO TABS
1000.0000 mg | ORAL_TABLET | Freq: Once | ORAL | Status: AC
  Administered 2021-01-30: 14:00:00 1000 mg via ORAL
  Filled 2021-01-30: qty 2

## 2021-01-30 MED ORDER — DEXMEDETOMIDINE (PRECEDEX) IN NS 20 MCG/5ML (4 MCG/ML) IV SYRINGE
PREFILLED_SYRINGE | INTRAVENOUS | Status: DC | PRN
  Administered 2021-01-30: 8 ug via INTRAVENOUS

## 2021-01-30 MED ORDER — ONDANSETRON HCL 4 MG/2ML IJ SOLN
INTRAMUSCULAR | Status: DC | PRN
  Administered 2021-01-30: 4 mg via INTRAVENOUS

## 2021-01-30 MED ORDER — SCOPOLAMINE 1 MG/3DAYS TD PT72
1.0000 | MEDICATED_PATCH | TRANSDERMAL | Status: DC
  Administered 2021-01-30: 14:00:00 1.5 mg via TRANSDERMAL
  Filled 2021-01-30: qty 1

## 2021-01-30 MED ORDER — DEXAMETHASONE SODIUM PHOSPHATE 10 MG/ML IJ SOLN
INTRAMUSCULAR | Status: AC
  Filled 2021-01-30: qty 3

## 2021-01-30 MED ORDER — POVIDONE-IODINE 10 % EX SWAB
2.0000 "application " | Freq: Once | CUTANEOUS | Status: AC
  Administered 2021-01-30: 2 via TOPICAL

## 2021-01-30 MED ORDER — PROPOFOL 10 MG/ML IV BOLUS
INTRAVENOUS | Status: AC
  Filled 2021-01-30: qty 20

## 2021-01-30 MED ORDER — OXYCODONE-ACETAMINOPHEN 5-325 MG PO TABS: 1.0000 | ORAL_TABLET | Freq: Four times a day (QID) | ORAL | 0 refills | Status: DC | PRN

## 2021-01-30 MED ORDER — MIDAZOLAM HCL 2 MG/2ML IJ SOLN
INTRAMUSCULAR | Status: AC
  Filled 2021-01-30: qty 2

## 2021-01-30 MED ORDER — FENTANYL CITRATE (PF) 250 MCG/5ML IJ SOLN
INTRAMUSCULAR | Status: AC
  Filled 2021-01-30: qty 5

## 2021-01-30 MED ORDER — DOXYCYCLINE HYCLATE 100 MG IV SOLR
200.0000 mg | INTRAVENOUS | Status: AC
  Administered 2021-01-30: 15:00:00 200 mg via INTRAVENOUS
  Filled 2021-01-30: qty 200

## 2021-01-30 MED ORDER — CHLOROPROCAINE HCL 1 % IJ SOLN
INTRAMUSCULAR | Status: DC | PRN
  Administered 2021-01-30: 10 mL

## 2021-01-30 MED ORDER — LIDOCAINE 2% (20 MG/ML) 5 ML SYRINGE
INTRAMUSCULAR | Status: DC | PRN
  Administered 2021-01-30: 100 mg via INTRAVENOUS

## 2021-01-30 SURGICAL SUPPLY — 23 items
CATH ROBINSON RED A/P 16FR (CATHETERS) ×3 IMPLANT
DECANTER SPIKE VIAL GLASS SM (MISCELLANEOUS) ×3 IMPLANT
FILTER UTR ASPR ASSEMBLY (MISCELLANEOUS) ×3 IMPLANT
GLOVE SURG POLYISO LF SZ6.5 (GLOVE) ×3 IMPLANT
GLOVE SURG UNDER POLY LF SZ6.5 (GLOVE) ×3 IMPLANT
GLOVE SURG UNDER POLY LF SZ7 (GLOVE) ×3 IMPLANT
GOWN STRL REUS W/ TWL LRG LVL3 (GOWN DISPOSABLE) ×2 IMPLANT
GOWN STRL REUS W/TWL LRG LVL3 (GOWN DISPOSABLE) ×6
HOSE CONNECTING 18IN BERKELEY (TUBING) ×3 IMPLANT
KIT BERKELEY 1ST TRI 3/8 NO TR (MISCELLANEOUS) ×3 IMPLANT
KIT BERKELEY 1ST TRIMESTER 3/8 (MISCELLANEOUS) ×3 IMPLANT
NS IRRIG 1000ML POUR BTL (IV SOLUTION) ×3 IMPLANT
PACK VAGINAL MINOR WOMEN LF (CUSTOM PROCEDURE TRAY) ×3 IMPLANT
PAD OB MATERNITY 4.3X12.25 (PERSONAL CARE ITEMS) ×3 IMPLANT
SET BERKELEY SUCTION TUBING (SUCTIONS) ×3 IMPLANT
TOWEL GREEN STERILE FF (TOWEL DISPOSABLE) ×6 IMPLANT
UNDERPAD 30X36 HEAVY ABSORB (UNDERPADS AND DIAPERS) ×3 IMPLANT
VACURETTE 10 RIGID CVD (CANNULA) IMPLANT
VACURETTE 12 RIGID CVD (CANNULA) ×2 IMPLANT
VACURETTE 6 ASPIR F TIP BERK (CANNULA) IMPLANT
VACURETTE 7MM CVD STRL WRAP (CANNULA) IMPLANT
VACURETTE 8 RIGID CVD (CANNULA) IMPLANT
VACURETTE 9 RIGID CVD (CANNULA) IMPLANT

## 2021-01-30 NOTE — Op Note (Signed)
Catherine Simmons PROCEDURE DATE: 01/30/2021  PREOPERATIVE DIAGNOSIS: 12 week missed abortion. POSTOPERATIVE DIAGNOSIS: The same. PROCEDURE:     Dilation and Evacuation. SURGEON:  Dr. Catalina Antigua  INDICATIONS: 35 y.o. [id number] MAB at [redacted] weeks gestation, needing surgical completion.  Risks of surgery were discussed with the patient including but not limited to: bleeding which may require transfusion; infection which may require antibiotics; injury to uterus or surrounding organs;need for additional procedures including laparotomy or laparoscopy; possibility of intrauterine scarring which may impair future fertility; and other postoperative/anesthesia complications. Written informed consent was obtained.    FINDINGS:  A 12 size anteverted uterus, moderate amounts of products of conception, specimen sent to pathology.  ANESTHESIA:    Monitored intravenous sedation, paracervical block. INTRAVENOUS FLUIDS:  800 ml of LR ESTIMATED BLOOD LOSS:  Less than 20 ml. SPECIMENS:  Products of conception sent to pathology COMPLICATIONS:  None immediate.  PROCEDURE DETAILS:  The patient received intravenous antibiotics while in the preoperative area.  She was then taken to the operating room where general anesthesia was administered and was found to be adequate.  After an adequate timeout was performed, she was placed in the dorsal lithotomy position and examined; then prepped and draped in the sterile manner.   Her bladder was catheterized for an unmeasured amount of clear, yellow urine. A vaginal speculum was then placed in the patient's vagina and a single tooth tenaculum was applied to the anterior lip of the cervix.  A paracervical block using 0.5% Marcaine was administered. The cervix was gently dilated to accommodate a 12 mm suction curette that was gently advanced to the uterine fundus.  The suction device was then activated and curette slowly rotated to clear the uterus of products of conception.  A  sharp curettage was then performed to confirm complete emptying of the uterus. There was minimal bleeding noted and the tenaculum removed with good hemostasis noted.   All instruments were removed from the patient's vagina. The patient tolerated the procedure well and was taken to the recovery area awake, and in stable condition.  The patient will be discharged to home as per PACU criteria.  Routine postoperative instructions given.  She was prescribed Percocet, Ibuprofen and Colace.  She will follow up in the clinic in 2 weeks for postoperative evaluation.

## 2021-01-30 NOTE — H&P (Signed)
Catherine Simmons is a 35 y.o. female P5 at 12 weeks by LMP presenting for surgical management of a missed abortion. Patient was diagnosed with a missed Ab on 01/29/21. She reports some cramping pain and vaginal bleeding which is heavier than yesterday.   OB History     Gravida  7   Para  5   Term  4   Preterm  1   AB  1   Living  5      SAB      IAB  1   Ectopic      Multiple  0   Live Births  5          Past Medical History:  Diagnosis Date   Anemia    Cholecystitis 07/16/2010   Depression    Genital warts 2004   Headache    Hemorrhoids    HIV (human immunodeficiency virus infection) (HCC)    Hx of pelvic inflammatory disease 08/31/2013   And hx of multiple STDs    Hypertension    Ovarian cyst    Pregnancy induced hypertension    Sickle cell trait (HCC)    UTI (urinary tract infection)    Past Surgical History:  Procedure Laterality Date   CHOLECYSTECTOMY  07/19/2010   EXTERNAL CEPHALIC VERSION  12/11/2016       WISDOM TOOTH EXTRACTION     Family History: family history includes Cancer in her mother; Diabetes in her maternal aunt and mother; Hypertension in her father and mother; Pancreatitis in her mother. Social History:  reports that she quit smoking about 4 months ago. Her smoking use included cigarettes. She started smoking about 8 years ago. She has never used smokeless tobacco. She reports that she does not currently use alcohol. She reports that she does not currently use drugs after having used the following drugs: Marijuana. Frequency: 5.00 times per week.    Review of Systems See pertinent in HPI. All other systems reviewed and non contributory History   Blood pressure (!) 142/95, pulse 89, resp. rate 19, height 5\' 2"  (1.575 m), weight 99.8 kg, last menstrual period 09/09/2020, SpO2 100 %, unknown if currently breastfeeding. Exam Physical Exam  GENERAL: Well-developed, well-nourished female in no acute distress.  LUNGS: Clear to  auscultation bilaterally.  HEART: Regular rate and rhythm. ABDOMEN: Soft, nontender, nondistended. No organomegaly. PELVIC: Deferred to OR EXTREMITIES: No cyanosis, clubbing, or edema, 2+ distal pulses.  Prenatal labs: ABO, Rh: --/--/A POS (12/08 2203) Antibody: NEG (12/08 2203) Rubella: 2.47 (12/08 2203) RPR: NON REACTIVE (12/08 2203)  HBsAg: NON REACTIVE (12/08 2203)  HIV:    GBS:     CT HEAD WO CONTRAST (2204)  Result Date: 01/14/2021 CLINICAL DATA:  Headache, new or worsening, pregnant EXAM: CT HEAD WITHOUT CONTRAST TECHNIQUE: Contiguous axial images were obtained from the base of the skull through the vertex without intravenous contrast. COMPARISON:  CT head April 01, 2020. FINDINGS: Brain: No evidence of acute infarction, hemorrhage, hydrocephalus, extra-axial collection or mass lesion/mass effect. Partially empty sella. Vascular: No hyperdense vessel identified. Skull: No evidence of acute fracture. Sinuses/Orbits: Clear sinuses.  Unremarkable orbits. Other: No mastoid effusions. IMPRESSION: 1. No evidence of acute intracranial abnormality. 2. Partially empty sella, which is often a normal anatomic variant but can be associated with idiopathic intracranial hypertension. Electronically Signed   By: April 03, 2020 M.D.   On: 01/14/2021 13:36   MR ANGIO HEAD WO CONTRAST  Result Date: 01/16/2021 CLINICAL DATA:  Initial evaluation for cerebral  aneurysm screening. EXAM: MRA HEAD WITHOUT CONTRAST TECHNIQUE: Angiographic images of the Circle of Willis were acquired using MRA technique without intravenous contrast. COMPARISON:  Prior study from 01/14/2021 FINDINGS: Anterior circulation: Examination mildly degraded by motion artifact. Visualized distal cervical segments of both internal carotid arteries are widely patent with antegrade flow. Petrous, cavernous, and supraclinoid segments patent without stenosis or other abnormality. A1 segments widely patent. Normal anterior communicating  artery complex. Anterior cerebral arteries patent without stenosis. No M1 stenosis or occlusion. Normal MCA bifurcations. Distal MCA branches perfused and symmetric. Posterior circulation: Both vertebral arteries widely patent to the vertebrobasilar junction without stenosis. Both PICA origins patent and normal. Basilar widely patent to its distal aspect without stenosis. Superior cerebellar arteries patent bilaterally. Both PCAs primarily supplied via the basilar well perfused or distal aspects. Anatomic variants: None significant. Other: No intracranial aneurysm or other vascular abnormality. IMPRESSION: Normal intracranial MRA. No aneurysm or other vascular abnormality. Electronically Signed   By: Rise Mu M.D.   On: 01/16/2021 22:34   MR BRAIN WO CONTRAST  Result Date: 01/14/2021 CLINICAL DATA:  Dizziness, non-specific Headache, new or worsening, pregnant; Dural venous sinus thrombosis suspected EXAM: MRI HEAD WITHOUT CONTRAST MRV HEAD WITHOUT CONTRAST TECHNIQUE: Multiplanar, multi-echo pulse sequences of the brain and surrounding structures were acquired without intravenous contrast. Angiographic images of the intracranial venous structures were acquired using MRV technique without intravenous contrast. COMPARISON:  Same day CT head. FINDINGS: MRI HEAD WITHOUT CONTRAST Brain: No acute infarction, hemorrhage, hydrocephalus, extra-axial collection or mass lesion. Partially empty sella. Vascular: Major arterial flow voids are maintained at the skull base. Skull and upper cervical spine: Normal marrow signal. Sinuses/Orbits: Clear sinuses.  Unremarkable orbits. Other: No sizable mastoid effusions. MR VENOGRAM WITHOUT CONTRAST No evidence of dural venous sinus thrombosis. The superior sagittal sinus, transverse sinuses, and sigmoid sinuses are patent. Bilateral visualized upper internal jugular veins are patent. The visualized deep cerebral veins are patent. Stenosis of the distal transverse sinuses  bilaterally. IMPRESSION: 1. No evidence of acute intracranial abnormality. 2. No evidence of dural venous sinus thrombosis. 3. Partially empty sella none and narrowing of the distal transverse sinuses bilaterally. These findings could be anatomic variants, but can be seen with idiopathic intracranial hypertension in the correct clinical setting. Electronically Signed   By: Feliberto Harts M.D.   On: 01/14/2021 18:22   MR PELVIS WO CONTRAST  Result Date: 01/20/2021 CLINICAL DATA:  Right lower quadrant abdominal pain in early pregnancy, rule out appendicitis EXAM: MRI ABDOMEN AND PELVIS WITHOUT CONTRAST TECHNIQUE: Multiplanar multisequence MR imaging of the abdomen and pelvis was performed. No intravenous contrast was administered. COMPARISON:  CT abdomen pelvis, 04/29/2020 FINDINGS: COMBINED FINDINGS FOR BOTH MR ABDOMEN AND PELVIS Lower chest: No acute findings. Hepatobiliary: No mass or other parenchymal abnormality identified. Status post cholecystectomy. Mild postoperative biliary ductal dilatation. Pancreas: No mass, inflammatory changes, or other parenchymal abnormality identified. No pancreatic ductal dilatation. Spleen:  Within normal limits in size and appearance. Adrenals/Urinary Tract: No masses identified. No evidence of hydronephrosis. Stomach/Bowel: Visualized portions within the abdomen are unremarkable. The appendix is difficult to clearly visualize, candidate in the right lower quadrant (e.g. Series 9, image 13) without evidence of dilatation or adjacent inflammatory findings. Vascular/Lymphatic: No pathologically enlarged lymph nodes identified. No abdominal aortic aneurysm demonstrated. Reproductive: Early gestation in the uterus, which is otherwise unremarkable in appearance. Right ovarian cyst measuring 2.0 cm. The right ovary is normal in size. Normal left ovary with small follicles. Other:  None. Musculoskeletal: No  suspicious bone lesions identified. IMPRESSION: 1. The appendix is  difficult to clearly visualize, candidate in the right lower quadrant without evidence of dilatation or adjacent inflammatory findings. No MR evidence of appendicitis. 2. Right ovarian cyst measuring 2.0 cm, possibly symptomatic. The ovary is otherwise normal in size and appearance without adjacent inflammatory findings. 3. Early gestation in the uterus, which is otherwise unremarkable in appearance. Electronically Signed   By: Jearld Lesch M.D.   On: 01/20/2021 15:34   MR ABDOMEN WO CONTRAST  Result Date: 01/20/2021 CLINICAL DATA:  Right lower quadrant abdominal pain in early pregnancy, rule out appendicitis EXAM: MRI ABDOMEN AND PELVIS WITHOUT CONTRAST TECHNIQUE: Multiplanar multisequence MR imaging of the abdomen and pelvis was performed. No intravenous contrast was administered. COMPARISON:  CT abdomen pelvis, 04/29/2020 FINDINGS: COMBINED FINDINGS FOR BOTH MR ABDOMEN AND PELVIS Lower chest: No acute findings. Hepatobiliary: No mass or other parenchymal abnormality identified. Status post cholecystectomy. Mild postoperative biliary ductal dilatation. Pancreas: No mass, inflammatory changes, or other parenchymal abnormality identified. No pancreatic ductal dilatation. Spleen:  Within normal limits in size and appearance. Adrenals/Urinary Tract: No masses identified. No evidence of hydronephrosis. Stomach/Bowel: Visualized portions within the abdomen are unremarkable. The appendix is difficult to clearly visualize, candidate in the right lower quadrant (e.g. Series 9, image 13) without evidence of dilatation or adjacent inflammatory findings. Vascular/Lymphatic: No pathologically enlarged lymph nodes identified. No abdominal aortic aneurysm demonstrated. Reproductive: Early gestation in the uterus, which is otherwise unremarkable in appearance. Right ovarian cyst measuring 2.0 cm. The right ovary is normal in size. Normal left ovary with small follicles. Other:  None. Musculoskeletal: No suspicious bone  lesions identified. IMPRESSION: 1. The appendix is difficult to clearly visualize, candidate in the right lower quadrant without evidence of dilatation or adjacent inflammatory findings. No MR evidence of appendicitis. 2. Right ovarian cyst measuring 2.0 cm, possibly symptomatic. The ovary is otherwise normal in size and appearance without adjacent inflammatory findings. 3. Early gestation in the uterus, which is otherwise unremarkable in appearance. Electronically Signed   By: Jearld Lesch M.D.   On: 01/20/2021 15:34   US OB Comp Less 14 Wks  Result Date: 01/29/2021 CLINICAL DATA:  Vaginal bleeding and leaking amniotic fluid. EXAM: OBSTETRIC <14 WK ULTRASOUND TECHNIQUE: Transabdominal ultrasound was performed for evaluation of the gestation as well as the maternal uterus and adnexal regions. COMPARISON:  January 20, 2021 FINDINGS: Intrauterine gestational sac: Single Yolk sac:  Not Visualized. Embryo:  Visualized. Cardiac Activity: Not Visualized. Heart Rate: N/A bpm Fetal heart motion is seen on the prior study (173 beats per minute). A heterogeneous area is seen within the upper uterus and appears to represent an involuting gestational sac which contains what appears to be fetal structures. Subchorionic hemorrhage:  None visualized. Maternal uterus/adnexae: The bilateral ovaries are visualized and are normal in appearance. No pelvic free fluid is identified. IMPRESSION: Findings meet definitive criteria for failed pregnancy. This follows SRU consensus guidelines: Diagnostic Criteria for Nonviable Pregnancy Early in the First Trimester. [facility] Mis J Med [phone]. Electronically Signed   By: Aram Candela M.D.   On: 01/29/2021 03:55   US OB Comp Less 14 Wks  Result Date: 01/20/2021 CLINICAL DATA:  Leaking fluid. Positive FERN test. Estimated gestational age of [redacted] weeks, 0 days by LMP. EXAM: OBSTETRIC <14 WK ULTRASOUND TECHNIQUE: Transabdominal ultrasound was performed for evaluation of the  gestation as well as the maternal uterus and adnexal regions. COMPARISON:  December 31, 2020. FINDINGS: Intrauterine gestational sac: Single. Yolk  sac:  Not Visualized. Embryo:  Visualized. Cardiac Activity: Visualized. Heart Rate: 173 bpm CRL:   44.1 mm   11 w 1 d                  [place] EDC: 08/10/2021 Subchorionic hemorrhage:  None visualized. Maternal uterus/adnexae: Unremarkable. IMPRESSION: 1. No acute abnormality. Single live intrauterine pregnancy with estimated gestational age of [redacted] weeks, 0 days, showing appropriate interval growth since the prior study. Electronically Signed   By: Obie Dredge M.D.   On: 01/20/2021 14:38   MR MRV HEAD WO CM  Result Date: 01/14/2021 CLINICAL DATA:  Dizziness, non-specific Headache, new or worsening, pregnant; Dural venous sinus thrombosis suspected EXAM: MRI HEAD WITHOUT CONTRAST MRV HEAD WITHOUT CONTRAST TECHNIQUE: Multiplanar, multi-echo pulse sequences of the brain and surrounding structures were acquired without intravenous contrast. Angiographic images of the intracranial venous structures were acquired using MRV technique without intravenous contrast. COMPARISON:  Same day CT head. FINDINGS: MRI HEAD WITHOUT CONTRAST Brain: No acute infarction, hemorrhage, hydrocephalus, extra-axial collection or mass lesion. Partially empty sella. Vascular: Major arterial flow voids are maintained at the skull base. Skull and upper cervical spine: Normal marrow signal. Sinuses/Orbits: Clear sinuses.  Unremarkable orbits. Other: No sizable mastoid effusions. MR VENOGRAM WITHOUT CONTRAST No evidence of dural venous sinus thrombosis. The superior sagittal sinus, transverse sinuses, and sigmoid sinuses are patent. Bilateral visualized upper internal jugular veins are patent. The visualized deep cerebral veins are patent. Stenosis of the distal transverse sinuses bilaterally. IMPRESSION: 1. No evidence of acute intracranial abnormality. 2. No evidence of dural venous sinus thrombosis.  3. Partially empty sella none and narrowing of the distal transverse sinuses bilaterally. These findings could be anatomic variants, but can be seen with idiopathic intracranial hypertension in the correct clinical setting. Electronically Signed   By: Feliberto Harts M.D.   On: 01/14/2021 18:22    Assessment/Plan: 35 yo [id number] at 12w by LMP with a missed abortion here for surgical evacuation - Risks, benefits and alternatives were explained including but not limited to risks of bleeding, infection, uterine perforation and damage to adjacent organs. Patient verbalized understanding and all questions were answered    Davionne Dowty 01/30/2021, 1:08 PM

## 2021-01-30 NOTE — Anesthesia Procedure Notes (Signed)
Procedure Name: LMA Insertion Date/Time: 01/30/2021 2:41 PM Performed by: Carlos American, CRNA Pre-anesthesia Checklist: Patient identified, Emergency Drugs available, Suction available and Patient being monitored Patient Re-evaluated:Patient Re-evaluated prior to induction Oxygen Delivery Method: Circle System Utilized Preoxygenation: Pre-oxygenation with 100% oxygen Induction Type: IV induction Ventilation: Mask ventilation without difficulty LMA: LMA inserted LMA Size: 4.0 Number of attempts: 1 Placement Confirmation: positive ETCO2 Tube secured with: Tape Dental Injury: Teeth and Oropharynx as per pre-operative assessment

## 2021-01-30 NOTE — Transfer of Care (Signed)
Immediate Anesthesia Transfer of Care Note  Patient: Catherine Simmons  Procedure(s) Performed: DILATATION AND EVACUATION  Patient Location: PACU  Anesthesia Type:General  Level of Consciousness: drowsy and patient cooperative  Airway & Oxygen Therapy: Patient Spontanous Breathing  Post-op Assessment: Report given to RN and Post -op Vital signs reviewed and stable  Post vital signs: Reviewed and stable  Last Vitals:  Vitals Value Taken Time  BP 129/86 01/30/21 1514  Temp    Pulse 77   Resp 17 01/30/21 1517  SpO2 99   Vitals shown include unvalidated device data.  Last Pain:  Vitals:   01/30/21 1309  PainSc: 7          Complications: No notable events documented.

## 2021-01-30 NOTE — Anesthesia Preprocedure Evaluation (Addendum)
Anesthesia Evaluation  Patient identified by MRN, date of birth, ID band Patient awake    Reviewed: Allergy & Precautions, NPO status , Patient's Chart, lab work & pertinent test results  Airway Mallampati: II  TM Distance: >3 FB Neck ROM: Full    Dental  (+) Dental Advisory Given, Teeth Intact, Missing   Pulmonary neg pulmonary ROS, former smoker,    Pulmonary exam normal breath sounds clear to auscultation       Cardiovascular hypertension, Normal cardiovascular exam Rhythm:Regular Rate:Normal     Neuro/Psych  Headaches, PSYCHIATRIC DISORDERS Anxiety Depression  Neuromuscular disease    GI/Hepatic negative GI ROS, Neg liver ROS,   Endo/Other  negative endocrine ROS  Renal/GU negative Renal ROS     Musculoskeletal negative musculoskeletal ROS (+)   Abdominal (+) + obese,   Peds  Hematology  (+) Blood dyscrasia, anemia ,   Anesthesia Other Findings   Reproductive/Obstetrics                            Anesthesia Physical Anesthesia Plan  ASA: 2  Anesthesia Plan: General   Post-op Pain Management: Minimal or no pain anticipated, Tylenol PO (pre-op) and Toradol IV (intra-op)   Induction: Intravenous  PONV Risk Score and Plan: 4 or greater and Midazolam, Scopolamine patch - Pre-op, Treatment may vary due to age or medical condition, Dexamethasone and Ondansetron  Airway Management Planned: LMA  Additional Equipment: None  Intra-op Plan:   Post-operative Plan: Extubation in OR  Informed Consent: I have reviewed the patients History and Physical, chart, labs and discussed the procedure including the risks, benefits and alternatives for the proposed anesthesia with the patient or authorized representative who has indicated his/her understanding and acceptance.     Dental advisory given  Plan Discussed with: CRNA  Anesthesia Plan Comments:        Anesthesia Quick  Evaluation

## 2021-02-03 ENCOUNTER — Encounter (HOSPITAL_COMMUNITY): Payer: Self-pay | Admitting: Obstetrics and Gynecology

## 2021-02-03 NOTE — Anesthesia Postprocedure Evaluation (Addendum)
Anesthesia Post Note  Patient: Catherine Simmons  Procedure(s) Performed: DILATATION AND EVACUATION     Patient location during evaluation: PACU Anesthesia Type: General Level of consciousness: sedated and patient cooperative Pain management: pain level controlled Vital Signs Assessment: post-procedure vital signs reviewed and stable Respiratory status: spontaneous breathing Cardiovascular status: stable Anesthetic complications: no   No notable events documented.  Last Vitals:  Vitals:   01/30/21 1545 01/30/21 1600  BP: 118/76 115/78  Pulse:  64  Resp: 15 15  Temp:  36.4 C  SpO2: 100% 100%    Last Pain:  Vitals:   01/30/21 1600  PainSc: 0-No pain                 Lewie Loron

## 2021-02-04 LAB — SURGICAL PATHOLOGY

## 2021-02-04 NOTE — BH Specialist Note (Signed)
Integrated Behavioral Health via Telemedicine Visit  02/04/2021 Catherine Simmons [id number]  Number of Integrated Behavioral Health visits: 1 Session Start time: 9:17  Session End time: 9:59 Total time:  11  Referring Provider: Jaynie Collins, MD Patient/Family location: Home [facility] Provider location: [facility]  All persons participating in visit: Patient Catherine Simmons and [facility] Catherine Simmons   Types of Service: Individual psychotherapy and Video visit  I connected with Catherine Simmons and/or Catherine Simmons's  n/a  via  Telephone or Engineer, civil (consulting)  (Video is Surveyor, mining) and verified that I am speaking with the correct person using two identifiers. Discussed confidentiality: Yes  I discussed the limitations of telemedicine and the availability of in person appointments.  Discussed there is a possibility of technology failure and discussed alternative modes of communication if that failure occurs.  I discussed that engaging in this telemedicine visit, they consent to the provision of behavioral healthcare and the services will be billed under their insurance.  Patient and/or legal guardian expressed understanding and consented to Telemedicine visit: Yes   Presenting Concerns: Patient and/or family reports the following symptoms/concerns: Grieving loss of pregnancy;adjusting to loss of mom three years ago; dreamed mom was holding a baby; since pregnancy loss, poor appetite and poor sleep with headaches; drinking soda throughout the day instead of food; Staying busy with work/avoiding thinking about losses helps cope. Duration of problem: Over three weeks since loss of baby; three years since loss of mother  Patient and/or Family's Strengths/Protective Factors: Social connections, Concrete supports in place (healthy food, safe environments, etc.), and Sense of purpose  Goals  Addressed: Patient will:  Reduce symptoms of: anxiety and depression   Increase knowledge and/or ability of: healthy habits   Demonstrate ability to: Increase healthy adjustment to current life circumstances and Increase adequate support systems for patient/family  Progress towards Goals: Ongoing  Interventions: Interventions utilized:  Solution-Focused Strategies, Psychoeducation and/or Health Education, and Link to [employer] Standardized Assessments completed: Not Needed  Patient and/or Family Response: Pt agrees with treatment plan  Assessment: Patient currently experiencing Grief.   Patient may benefit from psychoeducation and brief therapeutic interventions regarding coping with symptoms of anxiety and depression related to ongoing grief  Plan: Follow up with behavioral health clinician on : Two weeks Behavioral recommendations:  -Consider replacing at least one regular soda/day with water, and switching out one other regular soda/day with decaffeinated soda. (Notice if this helps increase appetite)  -Consider eating every 5 hours in daytime (even a few bites), to help decrease headaches -Continue with plan to contact neurologist to reschedule missed appointment regarding headaches -Consider additional grief support listed on After Visit Summary Referral(s): Integrated Art gallery manager (In Clinic) and [employer]:  grief support  I discussed the assessment and treatment plan with the patient and/or parent/guardian. They were provided an opportunity to ask questions and all were answered. They agreed with the plan and demonstrated an understanding of the instructions.   They were advised to call back or seek an in-person evaluation if the symptoms worsen or if the condition fails to improve as anticipated.  Rae Lips, LCSW  Depression screen [facility] 2/9 12/20/2020 12/26/2019 11/09/2019 08/04/2018 11/27/2016  Decreased Interest 3 0 0 0 2  Down,  Depressed, Hopeless 3 0 0 0 1  PHQ - 2 Score 6 0 0 0 3  Altered sleeping - - - - 3  Tired, decreased energy - - - -  2  Change in appetite - - - - 2  Feeling bad or failure about yourself  - - - - 2  Trouble concentrating - - - - 0  Moving slowly or fidgety/restless - - - - 1  Suicidal thoughts - - - - 0  PHQ-9 Score - - - - 13  Some recent data might be hidden   GAD 7 : Generalized Anxiety Score 11/27/2016 11/19/2016 11/05/2016 10/22/2016  Nervous, Anxious, on Edge 2 1 2 2   Control/stop worrying 2 1 1 2   Worry too much - different things 2 2 2 2   Trouble relaxing 2 2 3 2   Restless 2 1 2 2   Easily annoyed or irritable 3 3 3 3   Afraid - awful might happen 1 1 1  0  Total GAD 7 Score 14 11 14 13     Edinburgh Postnatal Depression Scale Screening Tool 01/27/2017  I have been able to laugh and see the funny side of things. 0  I have looked forward with enjoyment to things. 0  I have blamed myself unnecessarily when things went wrong. 2  I have been anxious or worried for no good reason. 2  I have felt scared or panicky for no good reason. 0  Things have been getting on top of me. 2  I have been so unhappy that I have had difficulty sleeping. 2  I have felt sad or miserable. 2  I have been so unhappy that I have been crying. 2  The thought of harming myself has occurred to me. 0  Edinburgh Postnatal Depression Scale Total 12

## 2021-02-12 ENCOUNTER — Encounter: Payer: Medicaid Other | Admitting: Certified Nurse Midwife

## 2021-02-18 ENCOUNTER — Ambulatory Visit (INDEPENDENT_AMBULATORY_CARE_PROVIDER_SITE_OTHER): Payer: Medicaid Other | Admitting: Clinical

## 2021-02-18 DIAGNOSIS — F4321 Adjustment disorder with depressed mood: Secondary | ICD-10-CM

## 2021-02-18 NOTE — Patient Instructions (Signed)
Center for [facility] at [facility] [address] [place], [place] [address] [phone] (main office) [phone] (Rhythm Gubbels's office)  Hello Catherine Simmons, These are resources that have been helpful to women and families experiencing grief:   www.postpartum.net (search for pregnancy loss support groups online)  [facility] virtual bereavement support group will be facilitated by pastoral care and perinatal education.  To start, this group will meet once a month. The registration location for this support group is on [facility] website > your wellbeing > classes and support groups > support groups  [facility] (Individual and group grief support)   [facility]  641-229-3761   Also:  www.BrideEmporium.nl  www.nationalshare.org  www.missfoundation.org  www.nilmdts.org        www.stillstandingmag.com  www.rtzhope.org

## 2021-02-19 ENCOUNTER — Encounter: Payer: Self-pay | Admitting: *Deleted

## 2021-02-20 ENCOUNTER — Ambulatory Visit (INDEPENDENT_AMBULATORY_CARE_PROVIDER_SITE_OTHER): Payer: Medicaid Other | Admitting: Obstetrics and Gynecology

## 2021-02-20 ENCOUNTER — Encounter: Payer: Self-pay | Admitting: Family Medicine

## 2021-02-20 ENCOUNTER — Other Ambulatory Visit: Payer: Self-pay

## 2021-02-20 ENCOUNTER — Encounter: Payer: Self-pay | Admitting: Obstetrics and Gynecology

## 2021-02-20 VITALS — BP 165/100 | HR 72 | Wt 233.7 lb

## 2021-02-20 DIAGNOSIS — O039 Complete or unspecified spontaneous abortion without complication: Secondary | ICD-10-CM

## 2021-02-20 DIAGNOSIS — Z5189 Encounter for other specified aftercare: Secondary | ICD-10-CM

## 2021-02-20 DIAGNOSIS — Z5941 Food insecurity: Secondary | ICD-10-CM

## 2021-02-20 DIAGNOSIS — I10 Essential (primary) hypertension: Secondary | ICD-10-CM

## 2021-02-20 DIAGNOSIS — B20 Human immunodeficiency virus [HIV] disease: Secondary | ICD-10-CM

## 2021-02-20 DIAGNOSIS — Z87898 Personal history of other specified conditions: Secondary | ICD-10-CM

## 2021-02-20 NOTE — Progress Notes (Signed)
Patient has elevated PHQ9/GAD7 and is already receiving [facility] services. Patient has appointment for 03/04/21.   Paulina Fusi, RN 02/20/21

## 2021-02-20 NOTE — BH Specialist Note (Signed)
Pt did not arrive to video visit and did not answer the phone; unable to leave voicemail; left MyChart message for patient.   

## 2021-02-20 NOTE — Progress Notes (Signed)
Obstetrics and Gynecology Visit Return Patient Evaluation  Appointment Date: 02/20/2021  OBGYN Clinic: Center for [facility]  Chief Complaint: post op follow up for miscarriage  History of Present Illness:  Catherine Simmons is a 36 y.o. G40P5. She had a 12/22 suction d&c for a missed AB at 12 weeks; final pathology benign. No period yet and just occasional spotting. No sexual intercourse yet. Pt is grieving appropriately  Review of Systems: as noted in the History of Present Illness.  Patient Active Problem List   Diagnosis Date Noted   Headache 01/16/2021   Menorrhagia with irregular cycle 09/30/2020   Abdominal pain, left lower quadrant 06/20/2020   Fatty liver 06/20/2020   Amenorrhea 11/09/2019   Iron deficiency anemia 05/13/2018   Fatigue 01/03/2018   Sickle cell trait (HCC) 07/22/2016   BMI 40.0-44.9, adult (HCC) 07/22/2016   Hidradenitis suppurativa 05/13/2016   Marijuana use 12/26/2014   Chronic hypertension 03/19/2014   Anxiety 06/30/2013   Human immunodeficiency virus (HIV) disease (HCC) 10/12/2012   CARPAL TUNNEL SYNDROME 11/14/2009   Adjustment disorder with depressed mood 05/09/2008   Medications:  Chelcee L. Villalona had no medications administered during this visit. Current Outpatient Medications  Medication Sig Dispense Refill   abacavir-dolutegravir-lamiVUDine (TRIUMEQ) 600-50-300 MG tablet Take 1 tablet by mouth daily. 30 tablet 0   ibuprofen (ADVIL) 600 MG tablet Take 1 tablet (600 mg total) by mouth every 6 (six) hours as needed. 60 tablet 3   acetaminophen (TYLENOL) 500 MG tablet Take 500-1,000 mg by mouth every 6 (six) hours as needed for mild pain or headache. (Patient not taking: Reported on 02/20/2021)     cyclobenzaprine (FLEXERIL) 10 MG tablet Take 1 tablet (10 mg total) by mouth 3 (three) times daily as needed for muscle spasms (headaches). (Patient not taking: Reported on 02/20/2021) 30 tablet 0   cyclobenzaprine (FLEXERIL)  5 MG tablet Take 1 tablet (5 mg total) by mouth 3 (three) times daily as needed for muscle spasms (or headache). (Patient not taking: Reported on 01/20/2021) 30 tablet 0   metoCLOPramide (REGLAN) 10 MG tablet Take 1 tablet (10 mg total) by mouth every 8 (eight) hours as needed for nausea (or headache). (Patient not taking: Reported on 02/20/2021) 30 tablet 0   oxyCODONE-acetaminophen (PERCOCET/ROXICET) 5-325 MG tablet Take 1 tablet by mouth every 6 (six) hours as needed. (Patient not taking: Reported on 02/20/2021) 20 tablet 0   No current facility-administered medications for this visit.    Allergies: is allergic to butorphanol and stadol [butorphanol tartrate].  Physical Exam:  BP (!) 165/100    Pulse 72    Wt 233 lb 11.2 oz (106 kg)    Breastfeeding No    BMI 42.74 kg/m  Body mass index is 42.74 kg/m. General appearance: Well nourished, well developed female in no acute distress.  Neuro/Psych:  Normal mood and affect.     Assessment: pt doing well  Plan:  1. Food insecurity - AMBULATORY REFERRAL TO [facility]  2. Follow-up visit after miscarriage Pt desires birth control. Prior to pregnancy, her periods were qmonth and regular. She has used depo and nexplanon in the past and had AUB with it. She is interested in OCPs. She is currently on a different HIV medication b/c she was pregnant. I will reach out to her ID providers to see what they want to do about her regimen now that she is not pregnant, and then I told her I'll see what's okay for birth control  after this; abstinence advised  Pap and hpv neg 12/2019>>rpt in 3 years  3. History of headache I'll send a note to [facility] re: her missing her new patient visit due to the miscarriage  4. Human immunodeficiency virus (HIV) disease (HCC) See above  5. Chronic hypertension See above.    RTC: PRN  Cornelia Copa MD Attending Center for [employer] [facility])

## 2021-02-26 ENCOUNTER — Ambulatory Visit: Payer: Medicaid Other

## 2021-03-02 ENCOUNTER — Other Ambulatory Visit: Payer: Self-pay | Admitting: Infectious Diseases

## 2021-03-04 ENCOUNTER — Telehealth: Payer: Self-pay

## 2021-03-04 ENCOUNTER — Other Ambulatory Visit: Payer: Self-pay | Admitting: Obstetrics and Gynecology

## 2021-03-04 ENCOUNTER — Ambulatory Visit: Payer: Medicaid Other | Admitting: Clinical

## 2021-03-04 DIAGNOSIS — Z91199 Patient's noncompliance with other medical treatment and regimen due to unspecified reason: Secondary | ICD-10-CM

## 2021-03-04 MED ORDER — SLYND 4 MG PO TABS: 1.0000 | ORAL_TABLET | Freq: Every day | ORAL | 3 refills | Status: DC

## 2021-03-04 NOTE — Telephone Encounter (Addendum)
-----   Message from Carlyle Bing, MD sent at 03/04/2021 10:18 AM EST ----- Regarding: OCP sent in for her Can you let her know that I heard back from her HIV NP?  Also, ask the patient to touch base with them if she hasn't heard back from them because they likely will change her HIV meds since she's not pregnant anymore. Likely switch her back to biktarvy Finally, let her know I sent in OCPs for her. Thanks   Called pt; patient's VM not set up. Will attempt to call patient a second time.

## 2021-03-05 ENCOUNTER — Other Ambulatory Visit: Payer: Self-pay | Admitting: Infectious Diseases

## 2021-03-05 MED ORDER — BIKTARVY 50-200-25 MG PO TABS: 1.0000 | ORAL_TABLET | Freq: Every day | ORAL | 1 refills | Status: DC

## 2021-03-05 NOTE — Telephone Encounter (Signed)
Called pt to review provider recommendation. Encouraged pt to follow up with infectious disease provider.

## 2021-03-17 ENCOUNTER — Ambulatory Visit: Payer: Medicaid Other

## 2021-03-19 ENCOUNTER — Telehealth: Payer: Self-pay

## 2021-03-19 NOTE — Telephone Encounter (Addendum)
Patient returned my call. Patient stated that she has been keeping her self busy. Patient scheduled for follow up appointment on 2/13.       Attempted to contact patient - VM box is full.   FW: Unread Message Notification Received: Today Blanchard Kelch, NP  P [facility] Triage Nurse Pool Hi team - can someone reach out to [facility] to see how she is doing and invite her in for a visit.   I have not talked to her since she had a miscarriage and I am not sure what her mood may be like. Hopeful she answers.   ~Stephanie        Previous Messages   ----- Message -----  From: Blanchard Kelch, NP  Sent: 03/05/2021  To: [name]  Subject: Unread Message Notification                     Good morning, Merideth Abbey,   I am hopeful that this email finds you well. I wanted to see about re-scheduling an appointment with me here at [facility] so we can make sure you have everything you need.   I have talked with your gynecologist, (Dr. Vergie Living, and was going to plan to put you back on Biktarvy.  This will be a much smaller pill for you to resume taking once a day.   I will send in a 30-d supply for you for now but would like to see you back in the office to check in and arrange further refills for you. STOP the triumeq (large greyish pill) and START the Biktarvy back once daily just like before.   Take care to separate this from other multivitamins and supplements by 6 hours. OK to take at the same time with your birth control pills.   Hope to hear from you soon,  Fayette Pho, CMA

## 2021-03-20 ENCOUNTER — Telehealth: Payer: Medicaid Other | Admitting: Infectious Diseases

## 2021-03-24 ENCOUNTER — Other Ambulatory Visit: Payer: Self-pay

## 2021-03-24 ENCOUNTER — Encounter: Payer: Self-pay | Admitting: Infectious Diseases

## 2021-03-24 ENCOUNTER — Ambulatory Visit: Payer: Medicaid Other

## 2021-03-24 ENCOUNTER — Ambulatory Visit (INDEPENDENT_AMBULATORY_CARE_PROVIDER_SITE_OTHER): Payer: Medicaid Other | Admitting: Infectious Diseases

## 2021-03-24 VITALS — BP 161/108 | HR 62 | Temp 98.0°F | Wt 232.0 lb

## 2021-03-24 DIAGNOSIS — I10 Essential (primary) hypertension: Secondary | ICD-10-CM

## 2021-03-24 DIAGNOSIS — Z113 Encounter for screening for infections with a predominantly sexual mode of transmission: Secondary | ICD-10-CM

## 2021-03-24 DIAGNOSIS — G43809 Other migraine, not intractable, without status migrainosus: Secondary | ICD-10-CM | POA: Diagnosis not present

## 2021-03-24 DIAGNOSIS — N949 Unspecified condition associated with female genital organs and menstrual cycle: Secondary | ICD-10-CM

## 2021-03-24 DIAGNOSIS — G4489 Other headache syndrome: Secondary | ICD-10-CM | POA: Diagnosis not present

## 2021-03-24 DIAGNOSIS — B20 Human immunodeficiency virus [HIV] disease: Secondary | ICD-10-CM | POA: Diagnosis not present

## 2021-03-24 DIAGNOSIS — I159 Secondary hypertension, unspecified: Secondary | ICD-10-CM | POA: Diagnosis not present

## 2021-03-24 MED ORDER — METRONIDAZOLE 500 MG PO TABS: 500.0000 mg | ORAL_TABLET | Freq: Two times a day (BID) | ORAL | 0 refills | Status: DC

## 2021-03-24 MED ORDER — PROPRANOLOL HCL 40 MG PO TABS: 40.0000 mg | ORAL_TABLET | Freq: Two times a day (BID) | ORAL | 2 refills | Status: DC

## 2021-03-24 NOTE — Assessment & Plan Note (Addendum)
Doing well on biktarvy again. Tolerated Triumeq during pregnancy but had a hard time tolerating the larger pill size. Will update VL today and CD4 count. STI screen as requested.  She is doing well on contraception now.   Discussed referral to primary care / internal medicine team given her other health conditions for best treatment support.

## 2021-03-24 NOTE — Assessment & Plan Note (Signed)
Suspect BV as she has had this several times in the past. Will collect cervicovaginal swab today and provide metronidazole 500 mg BID x 7d presumptively.

## 2021-03-24 NOTE — Assessment & Plan Note (Signed)
Has had a history of hypetension previously on Amlodipine 10 + HCTZ 12.5. Did not notice much improvement on migraines then. Will start propanolol 40 mg BID for BP and migraine proph. I am sure she will need dose increase to control and maybe addition back of HCTZ but will see what her results are in 2-3 months at repeat check.

## 2021-03-24 NOTE — Progress Notes (Signed)
Name: Catherine Simmons  DOB: 1985-07-27 MRN: [id number] PCP: Pcp, No     Subjective:   CC: HIV follow up care Resumed biktarvy after miscarriage recently.  Requesting STI screening today for vaginal discomfort.     HPI: Catherine Simmons is back on biktarvy after her miscarriage recently. No missed doses and thankful for smaller pill.   Migraines that were pretty horrible during pregnancy - had spinal tap for a w/o of these and normal. Missed follow up with neurology and was hopeful to get back in to see them.  Now on progesterone only pills for contraception. Only a 2-day period in January. Used lavender pads and tissues to help clean up. Having some discomfort in lower abdomen. Sex with partner 2 weeks ago and would like to ensure no STI.   Off blood pressure medications. Previously on HCTZ + Amlodipine.    Review of Systems  Constitutional:  Negative for chills, diaphoresis and unexpected weight change.  Gastrointestinal:  Negative for blood in stool and rectal pain.  Genitourinary:  Positive for pelvic pain and vaginal pain. Negative for dyspareunia, dysuria, genital sores, menstrual problem, urgency and vaginal discharge.  Skin:  Negative for rash.  Neurological:  Positive for headaches. Negative for dizziness, facial asymmetry and weakness.  Psychiatric/Behavioral:  Negative for dysphoric mood and sleep disturbance. The patient is not nervous/anxious.      Past Medical History:  Diagnosis Date   Anemia    Cholecystitis 07/16/2010   Depression    Genital warts 2004   Headache    Hemorrhoids    HIV (human immunodeficiency virus infection) (HCC)    Hx of pelvic inflammatory disease 08/31/2013   And hx of multiple STDs    Hypertension    Influenza A 01/17/2021   Ovarian cyst    Pregnancy induced hypertension    Sickle cell trait (HCC)    UTI (urinary tract infection)     Outpatient Medications Prior to Visit  Medication Sig Dispense Refill    bictegravir-emtricitabine-tenofovir AF (BIKTARVY) 50-200-25 MG TABS tablet Take 1 tablet by mouth daily. 30 tablet 1   Drospirenone (SLYND) 4 MG TABS Take 1 tablet by mouth daily. 84 tablet 3   ibuprofen (ADVIL) 600 MG tablet Take 1 tablet (600 mg total) by mouth every 6 (six) hours as needed. 60 tablet 3   acetaminophen (TYLENOL) 500 MG tablet Take 500-1,000 mg by mouth every 6 (six) hours as needed for mild pain or headache. (Patient not taking: Reported on 02/20/2021)     cyclobenzaprine (FLEXERIL) 10 MG tablet Take 1 tablet (10 mg total) by mouth 3 (three) times daily as needed for muscle spasms (headaches). (Patient not taking: Reported on 02/20/2021) 30 tablet 0   cyclobenzaprine (FLEXERIL) 5 MG tablet Take 1 tablet (5 mg total) by mouth 3 (three) times daily as needed for muscle spasms (or headache). (Patient not taking: Reported on 01/20/2021) 30 tablet 0   metoCLOPramide (REGLAN) 10 MG tablet Take 1 tablet (10 mg total) by mouth every 8 (eight) hours as needed for nausea (or headache). (Patient not taking: Reported on 02/20/2021) 30 tablet 0   oxyCODONE-acetaminophen (PERCOCET/ROXICET) 5-325 MG tablet Take 1 tablet by mouth every 6 (six) hours as needed. (Patient not taking: Reported on 02/20/2021) 20 tablet 0   No facility-administered medications prior to visit.     Allergies  Allergen Reactions   Butorphanol Itching    Tolerates percocet   Stadol [Butorphanol Tartrate] Itching    Tolerates percocet    Social  History   Tobacco Use   Smoking status: Former    Years: 15.00    Types: Cigarettes    Start date: 03/11/2012    Quit date: 09/2020    Years since quitting: 0.5   Smokeless tobacco: Never  Vaping Use   Vaping Use: Never used  Substance Use Topics   Alcohol use: Not Currently    Comment: occ   Drug use: Not Currently    Frequency: 5.0 times per week    Types: Marijuana    Comment: last use nov 2022     Social History   Substance and Sexual Activity  Sexual  Activity Not Currently   Partners: Male   Birth control/protection: None   Comment: approx 12 3/7 wks     Objective:   Vitals:   03/24/21 1112  BP: (!) 161/108  Pulse: 62  Temp: 98 F (36.7 C)  TempSrc: Temporal  Weight: 232 lb (105.2 kg)   Body mass index is 42.43 kg/m.   Physical Exam HENT:     Mouth/Throat:     Mouth: No oral lesions.     Dentition: No dental abscesses.  Cardiovascular:     Rate and Rhythm: Normal rate and regular rhythm.     Heart sounds: Normal heart sounds.  Pulmonary:     Effort: Pulmonary effort is normal.     Breath sounds: Normal breath sounds.  Abdominal:     General: There is no distension.     Palpations: Abdomen is soft.     Tenderness: There is no abdominal tenderness.  Musculoskeletal:        General: No tenderness. Normal range of motion.  Lymphadenopathy:     Cervical: No cervical adenopathy.  Skin:    General: Skin is warm and dry.     Findings: No rash.  Neurological:     Mental Status: She is alert and oriented to person, place, and time.  Psychiatric:        Judgment: Judgment normal.     Lab Results Lab Results  Component Value Date   WBC 7.1 01/20/2021   HGB 11.7 (L) 01/20/2021   HCT 33.8 (L) 01/20/2021   MCV 80.5 01/20/2021   PLT 183 01/20/2021    Lab Results  Component Value Date   CREATININE 0.53 01/20/2021   BUN <5 (L) 01/20/2021   NA 136 01/20/2021   K 3.8 01/20/2021   CL 105 01/20/2021   CO2 23 01/20/2021    Lab Results  Component Value Date   ALT 19 01/20/2021   AST 14 (L) 01/20/2021   ALKPHOS 50 01/20/2021   BILITOT 0.3 01/20/2021    Lab Results  Component Value Date   CHOL 196 08/29/2020   HDL 84 08/29/2020   LDLCALC 97 08/29/2020   TRIG 60 08/29/2020   CHOLHDL 2.3 08/29/2020   HIV 1 RNA Quant  Date Value  12/20/2020 NOT DETECTED copies/mL  08/29/2020 Not Detected Copies/mL  10/19/2019 <20 Copies/mL   HIV-1 RNA Viral Load  Date Value  11/19/2016 <20 copies/mL  05/15/2014 <40   04/09/2014 <40   CD4 (no units)  Date Value  04/09/2014 1,016  02/19/2014 776  12/27/2013 651   CD4 T Cell Abs (/uL)  Date Value  08/29/2020 903  10/19/2019 1,026  04/27/2018 1,030     Assessment & Plan:   Problem List Items Addressed This Visit       Unprioritized   Human immunodeficiency virus (HIV) disease (HCC) (Chronic)    Doing  well on biktarvy again. Tolerated Triumeq during pregnancy but had a hard time tolerating the larger pill size. Will update VL today and CD4 count. STI screen as requested.  She is doing well on contraception now.   Discussed referral to primary care / internal medicine team given her other health conditions for best treatment support.       Relevant Medications   metroNIDAZOLE (FLAGYL) 500 MG tablet   Chronic hypertension    Has had a history of hypetension previously on Amlodipine 10 + HCTZ 12.5. Did not notice much improvement on migraines then. Will start propanolol 40 mg BID for BP and migraine proph. I am sure she will need dose increase to control and maybe addition back of HCTZ but will see what her results are in 2-3 months at repeat check.       Relevant Medications   propranolol (INDERAL) 40 MG tablet   Vaginal discomfort    Suspect BV as she has had this several times in the past. Will collect cervicovaginal swab today and provide metronidazole 500 mg BID x 7d presumptively.       Headache    Provided neurology's number to her again. Propanolol to start for prophylaxis and BP tx.  Reviewed spinal tap and was normal aside from some RBCs from the procedure.       Relevant Medications   propranolol (INDERAL) 40 MG tablet   Other Visit Diagnoses     Other migraine without status migrainosus, not intractable    -  Primary   Relevant Medications   propranolol (INDERAL) 40 MG tablet   Screening for STDs (sexually transmitted diseases)       Relevant Orders   RPR   C. trachomatis/N. gonorrhoeae RNA   SURESWAB CT/NG/T.  vaginalis   SureSwab Advanced Vaginitis Plus,TMA   HIV disease (HCC)       Relevant Medications   metroNIDAZOLE (FLAGYL) 500 MG tablet   Other Relevant Orders   HIV-1 RNA quant-no reflex-bld   RPR   Secondary hypertension       Relevant Medications   propranolol (INDERAL) 40 MG tablet      Rexene Alberts, MSN, NP-C [facility] for Infectious Disease [facility] Pager: [phone] Office: [phone]  03/24/21  11:53 AM

## 2021-03-24 NOTE — Patient Instructions (Addendum)
For the neurology team call them to reschedule - Phone:  [phone]   Propanolol is a preventative medication for migraines that should also help with your blood pressure. Start taking this 1 tab twice a day. Please come back in 2-3 months to repeat your blood pressure and see how it is working to control the migraines    For primary care - Dr. Quay Burow or Dr. Sharlet Salina I really like -->  [place], [place] [address]  [place]: [phone] Sports Medicine: [phone] Fax: [phone] Hours (M-F): 8 am - 5 pm   I sent in flagyl for you to take 2 times a day with food for the discomfort / drainage. I most suspect BV. Will let you know what your results show.

## 2021-03-24 NOTE — Assessment & Plan Note (Signed)
Provided neurology's number to her again. Propanolol to start for prophylaxis and BP tx.  Reviewed spinal tap and was normal aside from some RBCs from the procedure.

## 2021-03-25 LAB — C. TRACHOMATIS/N. GONORRHOEAE RNA
C. trachomatis RNA, TMA: DETECTED — AB
N. gonorrhoeae RNA, TMA: NOT DETECTED

## 2021-03-25 MED ORDER — DOXYCYCLINE HYCLATE 100 MG PO TABS: 100.0000 mg | ORAL_TABLET | Freq: Two times a day (BID) | ORAL | 0 refills | Status: DC

## 2021-03-25 NOTE — Addendum Note (Signed)
Addended by: [place] Callas on: 03/25/2021 03:20 PM   Modules accepted: Orders

## 2021-03-26 LAB — SURESWAB® ADVANCED VAGINITIS PLUS,TMA
C. trachomatis RNA, TMA: DETECTED — AB
CANDIDA SPECIES: NOT DETECTED
Candida glabrata: NOT DETECTED
N. gonorrhoeae RNA, TMA: NOT DETECTED
SURESWAB(R) ADV BACTERIAL VAGINOSIS(BV),TMA: POSITIVE — AB
TRICHOMONAS VAGINALIS (TV),TMA: NOT DETECTED

## 2021-03-26 LAB — RPR: RPR Ser Ql: NONREACTIVE

## 2021-03-26 LAB — HIV-1 RNA QUANT-NO REFLEX-BLD
HIV 1 RNA Quant: NOT DETECTED Copies/mL
HIV-1 RNA Quant, Log: NOT DETECTED Log cps/mL

## 2021-03-27 NOTE — Telephone Encounter (Signed)
error 

## 2021-06-02 ENCOUNTER — Emergency Department (HOSPITAL_COMMUNITY)
Admission: EM | Admit: 2021-06-02 | Discharge: 2021-06-03 | Disposition: A | Discharge status: Home / Self Care | Payer: No Typology Code available for payment source | Attending: Emergency Medicine | Admitting: Emergency Medicine

## 2021-06-02 ENCOUNTER — Emergency Department (HOSPITAL_COMMUNITY): Payer: No Typology Code available for payment source

## 2021-06-02 ENCOUNTER — Encounter (HOSPITAL_COMMUNITY): Payer: Self-pay

## 2021-06-02 ENCOUNTER — Other Ambulatory Visit: Payer: Self-pay

## 2021-06-02 DIAGNOSIS — M25552 Pain in left hip: Secondary | ICD-10-CM | POA: Insufficient documentation

## 2021-06-02 DIAGNOSIS — M25511 Pain in right shoulder: Secondary | ICD-10-CM | POA: Diagnosis not present

## 2021-06-02 DIAGNOSIS — M545 Low back pain, unspecified: Secondary | ICD-10-CM | POA: Insufficient documentation

## 2021-06-02 DIAGNOSIS — Y999 Unspecified external cause status: Secondary | ICD-10-CM | POA: Diagnosis not present

## 2021-06-02 DIAGNOSIS — Y92410 Unspecified street and highway as the place of occurrence of the external cause: Secondary | ICD-10-CM | POA: Diagnosis not present

## 2021-06-02 DIAGNOSIS — Y9389 Activity, other specified: Secondary | ICD-10-CM | POA: Insufficient documentation

## 2021-06-02 LAB — I-STAT BETA HCG BLOOD, ED (MC, WL, AP ONLY): I-stat hCG, quantitative: <5 m[IU]/mL (ref <5)
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
## 2021-06-02 MED ORDER — ONDANSETRON 4 MG PO TBDP
4.0000 mg | ORAL_TABLET | Freq: Once | ORAL | Status: AC
Start: 2021-06-02 — End: 2021-06-02
  Administered 2021-06-02: 4 mg via ORAL
  Filled 2021-06-02: qty 1
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
## 2021-06-03 DIAGNOSIS — M25511 Pain in right shoulder: Secondary | ICD-10-CM | POA: Diagnosis not present

## 2021-06-03 MED ORDER — DICLOFENAC SODIUM 75 MG PO TBEC: 75.0000 mg | DELAYED_RELEASE_TABLET | Freq: Two times a day (BID) | ORAL | 0 refills | Status: DC

## 2021-06-03 MED ORDER — IBUPROFEN 800 MG PO TABS
800.0000 mg | ORAL_TABLET | Freq: Once | ORAL | Status: AC
  Administered 2021-06-03: 800 mg via ORAL
  Filled 2021-06-03: qty 1

## 2021-06-03 MED ORDER — METHOCARBAMOL 500 MG PO TABS: 500.0000 mg | ORAL_TABLET | Freq: Four times a day (QID) | ORAL | 0 refills | Status: DC

## 2021-06-03 NOTE — ED Notes (Signed)
X2 no response for vitals 

## 2021-06-03 NOTE — Discharge Instructions (Addendum)
Return if any problems.

## 2021-06-03 NOTE — ED Notes (Signed)
Pt visibly upset, crying. States that her son is upstairs and she just wants to know her results so that she can go be with him. PA at bedside.  ?
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
## 2021-07-18 ENCOUNTER — Encounter (HOSPITAL_COMMUNITY): Payer: Self-pay

## 2021-07-18 ENCOUNTER — Other Ambulatory Visit: Payer: Self-pay

## 2021-07-18 ENCOUNTER — Emergency Department (HOSPITAL_COMMUNITY)
Admission: EM | Admit: 2021-07-18 | Discharge: 2021-07-18 | Disposition: A | Discharge status: Home / Self Care | Payer: Medicaid Other | Attending: Emergency Medicine | Admitting: Emergency Medicine

## 2021-07-18 DIAGNOSIS — R112 Nausea with vomiting, unspecified: Secondary | ICD-10-CM | POA: Diagnosis present

## 2021-07-18 LAB — CBC WITH DIFFERENTIAL/PLATELET
Abs Immature Granulocytes: 0.03 10*3/uL (ref 0.00–0.07)
Basophils Absolute: 0 10*3/uL (ref 0.0–0.1)
Basophils Relative: 1 %
Eosinophils Absolute: 0 10*3/uL (ref 0.0–0.5)
Eosinophils Relative: 0 %
HCT: 37.1 % (ref 36.0–46.0)
Hemoglobin: 12.8 g/dL (ref 12.0–15.0)
Immature Granulocytes: 0 %
Lymphocytes Relative: 22 %
Lymphs Abs: 1.8 10*3/uL (ref 0.7–4.0)
MCH: 28.6 pg (ref 26.0–34.0)
MCHC: 34.5 g/dL (ref 30.0–36.0)
MCV: 82.8 fL (ref 80.0–100.0)
Monocytes Absolute: 0.6 10*3/uL (ref 0.1–1.0)
Monocytes Relative: 7 %
Neutro Abs: 5.9 10*3/uL (ref 1.7–7.7)
Neutrophils Relative %: 70 %
Platelets: 283 10*3/uL (ref 150–400)
RBC: 4.48 MIL/uL (ref 3.87–5.11)
RDW: 13.2 % (ref 11.5–15.5)
WBC: 8.4 10*3/uL (ref 4.0–10.5)
nRBC: 0 % (ref 0.0–0.2)

## 2021-07-18 LAB — TYPE AND SCREEN
ABO/RH(D): A POS
Antibody Screen: NEGATIVE

## 2021-07-18 LAB — BASIC METABOLIC PANEL
Anion gap: 8 (ref 5–15)
BUN: 10 mg/dL (ref 6–20)
CO2: 25 mmol/L (ref 22–32)
Calcium: 8.9 mg/dL (ref 8.9–10.3)
Chloride: 109 mmol/L (ref 98–111)
Creatinine, Ser: 0.61 mg/dL (ref 0.44–1.00)
GFR, Estimated: >60 mL/min (ref >60)
Glucose, Bld: 110 mg/dL — ABNORMAL HIGH (ref 70–99)
Potassium: 3.7 mmol/L (ref 3.5–5.1)
Sodium: 142 mmol/L (ref 135–145)

## 2021-07-18 MED ORDER — ONDANSETRON HCL 4 MG PO TABS: 4.0000 mg | ORAL_TABLET | Freq: Four times a day (QID) | ORAL | 0 refills | Status: DC

## 2021-07-18 MED ORDER — ONDANSETRON 4 MG PO TBDP
4.0000 mg | ORAL_TABLET | Freq: Once | ORAL | Status: AC
  Administered 2021-07-18: 4 mg via ORAL
  Filled 2021-07-18: qty 1

## 2021-07-18 NOTE — ED Triage Notes (Signed)
Pt arrived POV from home c/o throwing up bright red blood and yellow stuff since 8am. Pt denies any pain.

## 2021-07-18 NOTE — ED Notes (Signed)
Saltine crackers and ginger ale provided to pt per request

## 2021-07-18 NOTE — ED Notes (Signed)
Patient went outside. Unsure if she is returning.

## 2021-07-18 NOTE — Discharge Instructions (Signed)
Return for any problem  Avoid alcohol consumption  Use Zofran as needed for nausea

## 2021-07-18 NOTE — ED Provider Triage Note (Signed)
Emergency Medicine Provider Triage Evaluation Note  Catherine Simmons , Simmons 36 y.o. female  was evaluated in triage.  Pt complains of vomiting since this morning around 8:00.  Says she has had blood mixed in with her vomit.  Drink alcohol last night but reports only 1 alcoholic drink.  No diarrhea no associated abdominal pain.  Is not on blood thinners.  No history of GI bleed.  Review of Systems  Positive: Nausea and vomiting Negative: As above  Physical Exam  BP (!) 189/110   Pulse 61   Temp 97.8 F (36.6 C) (Oral)   Resp 18   Ht 5\' 2"  (1.575 m)   Wt 74.8 kg   SpO2 99%   BMI 30.18 kg/m  Gen:   Awake, no distress   Resp:  Normal effort  MSK:   Moves extremities without difficulty  Other:  Generalized discomfort, rolled into Simmons ball on the stretcher.  Medical Decision Making  Medically screening exam initiated at 2:54 PM.  Appropriate orders placed.  Catherine Simmons was informed that the remainder of the evaluation will be completed by another provider, this initial triage assessment does not replace that evaluation, and the importance of remaining in the ED until their evaluation is complete.    Basic labs and type and screen   Catherine Hyder A, PA-C 07/18/21 1455
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
## 2021-07-18 NOTE — ED Notes (Signed)
Ginger ale provided to pt for fluid challenge

## 2021-07-18 NOTE — ED Notes (Signed)
E-signature pad unavailable at time of pt discharge. This RN discussed discharge materials with pt and answered all pt questions. Pt stated understanding of discharge material. ? ?

## 2021-07-18 NOTE — ED Provider Notes (Signed)
[facility] EMERGENCY DEPARTMENT Provider Note   CSN: [id number] Arrival date & time: 07/18/21  1402     History  Chief Complaint  Patient presents with   Hematemesis    Catherine Simmons is a 36 y.o. female.  36 year old female with prior medical history as detailed below presents for evaluation.  Patient reports multiple episodes of emesis earlier this morning.  She reports that her first emesis was around 8 AM.  She reports several more episodes of emesis in the hour or 2 thereafter.  She saw some reported bright red blood in her later bouts of emesis.  She denies any bleeding this afternoon.  She denies any vomiting this afternoon.  She reports that she feels better now than she did last night.  She did have some alcohol last night.  She denies recent fever or other illness.  She denies diarrhea.  The history is provided by the patient and medical records.  Emesis Severity:  Mild Duration:  12 hours Timing:  Rare Quality:  Bright red blood Progression:  Resolved Chronicity:  New      Home Medications Prior to Admission medications   Medication Sig Start Date End Date Taking? Authorizing Provider  acetaminophen (TYLENOL) 500 MG tablet Take 500-1,000 mg by mouth every 6 (six) hours as needed for mild pain or headache. Patient not taking: Reported on 02/20/2021    [provider]  bictegravir-emtricitabine-tenofovir AF (BIKTARVY) 50-200-25 MG TABS tablet Take 1 tablet by mouth daily. 03/05/21   Blanchard Kelch, NP  diclofenac (VOLTAREN) 75 MG EC tablet Take 1 tablet (75 mg total) by mouth 2 (two) times daily. 06/03/21   Elson Areas, PA-C  doxycycline (VIBRA-TABS) 100 MG tablet Take 1 tablet (100 mg total) by mouth 2 (two) times daily. 03/25/21   Blanchard Kelch, NP  Drospirenone (SLYND) 4 MG TABS Take 1 tablet by mouth daily. 03/04/21   [place] Bing, MD  ibuprofen (ADVIL) 600 MG tablet Take 1 tablet (600 mg total) by mouth every 6 (six)  hours as needed. 01/30/21   Constant, Peggy, MD  methocarbamol (ROBAXIN) 500 MG tablet Take 1 tablet (500 mg total) by mouth 4 (four) times daily. 06/03/21   Elson Areas, PA-C  metroNIDAZOLE (FLAGYL) 500 MG tablet Take 1 tablet (500 mg total) by mouth 2 (two) times daily. 03/24/21   Blanchard Kelch, NP  propranolol (INDERAL) 40 MG tablet Take 1 tablet (40 mg total) by mouth 2 (two) times daily. 03/24/21   Blanchard Kelch, NP      Allergies    Butorphanol and Stadol [butorphanol tartrate]    Review of Systems   Review of Systems  Gastrointestinal:  Positive for vomiting.  All other systems reviewed and are negative.   Physical Exam Updated Vital Signs BP (!) 168/100   Pulse 74   Temp 97.8 F (36.6 C) (Oral)   Resp 12   Ht 5\' 2"  (1.575 m)   Wt 74.8 kg   SpO2 100%   BMI 30.18 kg/m  Physical Exam Vitals and nursing note reviewed.  Constitutional:      General: She is not in acute distress.    Appearance: Normal appearance. She is well-developed.  HENT:     Head: Normocephalic and atraumatic.  Eyes:     Conjunctiva/sclera: Conjunctivae normal.     Pupils: Pupils are equal, round, and reactive to light.  Cardiovascular:     Rate and Rhythm: Normal rate and regular rhythm.  Heart sounds: Normal heart sounds.  Pulmonary:     Effort: Pulmonary effort is normal. No respiratory distress.     Breath sounds: Normal breath sounds.  Abdominal:     General: There is no distension.     Palpations: Abdomen is soft.     Tenderness: There is no abdominal tenderness.  Genitourinary:    Comments: Declines rectal exam Musculoskeletal:        General: No deformity. Normal range of motion.     Cervical back: Normal range of motion and neck supple.  Skin:    General: Skin is warm and dry.  Neurological:     General: No focal deficit present.     Mental Status: She is alert and oriented to person, place, and time.     ED Results / Procedures / Treatments   Labs (all labs  ordered are listed, but only abnormal results are displayed) Labs Reviewed  BASIC METABOLIC PANEL - Abnormal; Notable for the following components:      Result Value   Glucose, Bld 110 (*)    All other components within normal limits  CBC WITH DIFFERENTIAL/PLATELET  TYPE AND SCREEN    EKG None  Radiology No results found.  Procedures Procedures    Medications Ordered in ED Medications  ondansetron (ZOFRAN-ODT) disintegrating tablet 4 mg (4 mg Oral Given 07/18/21 2130)    ED Course/ Medical Decision Making/ A&P                           Medical Decision Making Risk Prescription drug management.    Medical Screen Complete  This patient presented to the ED with complaint of vomiting, bloody tinged emesis.  This complaint involves an extensive number of treatment options. The initial differential diagnosis includes, but is not limited to, GI bleed, upper GI bleed, metabolic abnormality, alcoholic gastritis, Mallory-Weiss tear, etc.  This presentation is: Acute, Self-Limited, Previously Undiagnosed, Uncertain Prognosis, Complicated, Systemic Symptoms, and Threat to Life/Bodily Function  Patient reports that she had some alcohol consumption last night.  She woke up this morning and vomited several times.  After several rounds of emesis she noted some bloody tinged vomit.  Symptoms improved over the course of the afternoon.  She has not had any vomiting in the last 6 to 8 hours. She denies other bleeding.   Screening labs obtained are without significant abnormality.  Patient feels improved after ED evaluation and administration of Zofran.  She is taking good p.o.  She is without observed emesis.  She is without observed bleeding.  Patient understands need for close outpatient follow-up.  Patient is advised to avoid alcohol consumption.  Patient is advised to use Zofran if needed for nausea.  If she has recurrent bleeding or other discomfort she should return to the ED for  evaluation.  Additional history obtained:  External records from outside sources obtained and reviewed including prior ED visits and prior Inpatient records.    Lab Tests:  I ordered and personally interpreted labs.  The pertinent results include: CBC, BMP  Cardiac Monitoring:  The patient was maintained on a cardiac monitor.  I personally viewed and interpreted the cardiac monitor which showed an underlying rhythm of: NSR   Medicines ordered:  I ordered medication including Zofran for nausea Reevaluation of the patient after these medicines showed that the patient: resolved  Problem List / ED Course:  Emesis   Reevaluation:  After the interventions noted above, I reevaluated  the patient and found that they have: improved  Disposition:  After consideration of the diagnostic results and the patients response to treatment, I feel that the patent would benefit from close outpatient follow-up.          Final Clinical Impression(s) / ED Diagnoses Final diagnoses:  Nausea and vomiting, unspecified vomiting type    Rx / DC Orders ED Discharge Orders          Ordered    ondansetron (ZOFRAN) 4 MG tablet  Every 6 hours        07/18/21 2226              Wynetta Fines, MD 07/18/21 2226

## 2021-09-29 ENCOUNTER — Ambulatory Visit (HOSPITAL_COMMUNITY)
Admission: EM | Admit: 2021-09-29 | Discharge: 2021-09-29 | Disposition: A | Discharge status: Home / Self Care | Payer: Medicaid Other

## 2021-09-29 ENCOUNTER — Encounter (HOSPITAL_COMMUNITY): Payer: Self-pay

## 2021-09-29 ENCOUNTER — Emergency Department (HOSPITAL_COMMUNITY)
Admission: EM | Admit: 2021-09-29 | Discharge: 2021-09-30 | Disposition: A | Discharge status: Home / Self Care | Payer: Medicaid Other | Attending: Emergency Medicine | Admitting: Emergency Medicine

## 2021-09-29 ENCOUNTER — Other Ambulatory Visit: Payer: Self-pay

## 2021-09-29 DIAGNOSIS — H6001 Abscess of right external ear: Secondary | ICD-10-CM | POA: Diagnosis present

## 2021-09-29 DIAGNOSIS — I10 Essential (primary) hypertension: Secondary | ICD-10-CM

## 2021-09-29 DIAGNOSIS — L0291 Cutaneous abscess, unspecified: Secondary | ICD-10-CM

## 2021-09-29 DIAGNOSIS — I160 Hypertensive urgency: Secondary | ICD-10-CM

## 2021-09-29 MED ORDER — OXYCODONE-ACETAMINOPHEN 5-325 MG PO TABS
1.0000 | ORAL_TABLET | Freq: Once | ORAL | Status: AC
  Administered 2021-09-29: 1 via ORAL
  Filled 2021-09-29: qty 1

## 2021-09-29 NOTE — ED Notes (Signed)
Patient is being discharged from the Urgent Care and sent to the Emergency Department via POV . Per PA, patient is in need of higher level of care due to further evaluation. Patient is aware and verbalizes understanding of plan of care.  Vitals:   09/29/21 2004  BP: (!) 204/120  Pulse: 66  Resp: 18  Temp: 98.2 F (36.8 C)  SpO2: 98%

## 2021-09-29 NOTE — ED Provider Notes (Signed)
Patient presents originally for right ear pain with abscess. On presentation patient reports headache 8/10 with blurred vision and dizziness. Her blood pressure is elevated 204/120. Affect is tearful and she appears uncomfortable.  Denies chest pain, shortness of breath, weakness, numbness or tingling.  History of hypertension for which she takes propanolol.  Reports she has been taking twice daily as prescribed, and she did take her medicines today.  With this BP, signs of end organ damage, patient classifies as hypertensive urgency and needs to be evaluated in the emergency department.  Discussed with patient urgent care does not have the resources available to fully evaluate her.  She declines EMS transport and is discharged to the emergency department via personal vehicle.   Kathrine Haddock 09/29/21 2034

## 2021-09-29 NOTE — ED Provider Triage Note (Signed)
Emergency Medicine Provider Triage Evaluation Note  NEYA CREEGAN , a 36 y.o. female  was evaluated in triage.  Pt complains of right ear pain for 5 days.  She went to urgent care to be evaluated for this but was sent to emergency room because of elevated blood pressure.  She states that she is taking her medications.  She states that her right ear hurts and seems to radiate into her head.  She denies any numbness weakness slurred speech confusion or vision changes.  No chest pain difficulty breathing  Review of Systems  Positive: Right ear pain Negative: Fever  Physical Exam  BP (!) 204/131 (BP Location: Left Arm)   Pulse 95   Temp 97.8 F (36.6 C) (Oral)   Resp 18   Ht 5\' 2"  (1.575 m)   Wt 112.9 kg   SpO2 100%   BMI 45.54 kg/m  Gen:   Awake, no distress   Resp:  Normal effort  MSK:   Moves extremities without difficulty  Other:  Abscess just within the helix of the external ear.  Medical Decision Making  Medically screening exam initiated at 11:05 PM.  Appropriate orders placed.  was informed that the remainder of the evaluation will be completed by another provider, this initial triage assessment does not replace that evaluation, and the importance of remaining in the ED until their evaluation is complete.  I suspect that some of her elevated blood pressure is related to her discomfort from her swelling in her right ear.  We will provide her with a dose of Percocet here.   [name] [place], DOLE 09/29/21 2307

## 2021-09-29 NOTE — ED Triage Notes (Signed)
Pt c/o abscess inside rt ear and behind x3 days. States pain radiates up her head and down her neck.

## 2021-09-29 NOTE — Discharge Instructions (Addendum)
D/C to ED via POV 

## 2021-09-29 NOTE — ED Triage Notes (Signed)
Pt dx with abscess to inner right ear by urgent care today. Pt c/o pain in right ear, head, radiating down her neck.

## 2021-09-30 ENCOUNTER — Encounter (HOSPITAL_COMMUNITY): Payer: Self-pay | Admitting: Emergency Medicine

## 2021-09-30 LAB — BASIC METABOLIC PANEL
Anion gap: 5 (ref 5–15)
BUN: 10 mg/dL (ref 6–20)
CO2: 25 mmol/L (ref 22–32)
Calcium: 8.7 mg/dL — ABNORMAL LOW (ref 8.9–10.3)
Chloride: 109 mmol/L (ref 98–111)
Creatinine, Ser: 0.59 mg/dL (ref 0.44–1.00)
GFR, Estimated: >60 mL/min (ref >60)
Glucose, Bld: 94 mg/dL (ref 70–99)
Potassium: 3 mmol/L — ABNORMAL LOW (ref 3.5–5.1)
Sodium: 139 mmol/L (ref 135–145)

## 2021-09-30 LAB — CBC
HCT: 36.6 % (ref 36.0–46.0)
Hemoglobin: 12.4 g/dL (ref 12.0–15.0)
MCH: 28.4 pg (ref 26.0–34.0)
MCHC: 33.9 g/dL (ref 30.0–36.0)
MCV: 83.8 fL (ref 80.0–100.0)
Platelets: 223 10*3/uL (ref 150–400)
RBC: 4.37 MIL/uL (ref 3.87–5.11)
RDW: 12.8 % (ref 11.5–15.5)
WBC: 8.1 10*3/uL (ref 4.0–10.5)
nRBC: 0 % (ref 0.0–0.2)

## 2021-09-30 MED ORDER — DOXYCYCLINE HYCLATE 100 MG PO CAPS: 100.0000 mg | ORAL_CAPSULE | Freq: Two times a day (BID) | ORAL | 0 refills | Status: DC

## 2021-09-30 MED ORDER — DOXYCYCLINE HYCLATE 100 MG PO TABS
100.0000 mg | ORAL_TABLET | Freq: Once | ORAL | Status: AC
  Administered 2021-09-30: 100 mg via ORAL
  Filled 2021-09-30: qty 1

## 2021-09-30 NOTE — ED Provider Notes (Signed)
[facility]-EMERGENCY DEPT Provider Note   CSN: [id number] Arrival date & time: 09/29/21  2234     History  Chief Complaint  Patient presents with   Abscess    Catherine Simmons is a 36 y.o. female.  36 yo F with a chief complaints of an abscess to the right ear.  This been going on for a few days.  She went to urgent care and they were concerned about her blood pressure and sent her here for evaluation.  She denies any fevers or chills.   Abscess      Home Medications Prior to Admission medications   Medication Sig Start Date End Date Taking? Authorizing Provider  doxycycline (VIBRAMYCIN) 100 MG capsule Take 1 capsule (100 mg total) by mouth 2 (two) times daily. One po bid x 7 days 09/30/21  Yes Melene Plan, DO  acetaminophen (TYLENOL) 500 MG tablet Take 500-1,000 mg by mouth every 6 (six) hours as needed for mild pain or headache. Patient not taking: Reported on 02/20/2021    [provider]  bictegravir-emtricitabine-tenofovir AF (BIKTARVY) 50-200-25 MG TABS tablet Take 1 tablet by mouth daily. 03/05/21   Blanchard Kelch, NP  diclofenac (VOLTAREN) 75 MG EC tablet Take 1 tablet (75 mg total) by mouth 2 (two) times daily. 06/03/21   Elson Areas, PA-C  Drospirenone (SLYND) 4 MG TABS Take 1 tablet by mouth daily. 03/04/21   Alice Bing, MD  ibuprofen (ADVIL) 600 MG tablet Take 1 tablet (600 mg total) by mouth every 6 (six) hours as needed. 01/30/21   Constant, Peggy, MD  methocarbamol (ROBAXIN) 500 MG tablet Take 1 tablet (500 mg total) by mouth 4 (four) times daily. 06/03/21   Elson Areas, PA-C  ondansetron (ZOFRAN) 4 MG tablet Take 1 tablet (4 mg total) by mouth every 6 (six) hours. 07/18/21   Wynetta Fines, MD  propranolol (INDERAL) 40 MG tablet Take 1 tablet (40 mg total) by mouth 2 (two) times daily. 03/24/21   Blanchard Kelch, NP      Allergies    Butorphanol and Stadol [butorphanol tartrate]    Review of Systems   Review of  Systems  Physical Exam Updated Vital Signs BP (!) 204/131 (BP Location: Left Arm)   Pulse 95   Temp 97.8 F (36.6 C) (Oral)   Resp 18   Ht 5\' 2"  (1.575 m)   Wt 112.9 kg   SpO2 100%   BMI 45.54 kg/m  Physical Exam Vitals and nursing note reviewed.  Constitutional:      General: She is not in acute distress.    Appearance: She is well-developed. She is not diaphoretic.  HENT:     Head: Normocephalic and atraumatic.     Ears:      Comments: Area of erythema and fluctuance. Tm normal.  Eyes:     Pupils: Pupils are equal, round, and reactive to light.  Cardiovascular:     Rate and Rhythm: Normal rate and regular rhythm.     Heart sounds: No murmur heard.    No friction rub. No gallop.  Pulmonary:     Effort: Pulmonary effort is normal.     Breath sounds: No wheezing or rales.  Abdominal:     General: There is no distension.     Palpations: Abdomen is soft.     Tenderness: There is no abdominal tenderness.  Musculoskeletal:        General: No tenderness.     Cervical  back: Normal range of motion and neck supple.  Skin:    General: Skin is warm and dry.  Neurological:     Mental Status: She is alert and oriented to person, place, and time.  Psychiatric:        Behavior: Behavior normal.     ED Results / Procedures / Treatments   Labs (all labs ordered are listed, but only abnormal results are displayed) Labs Reviewed  BASIC METABOLIC PANEL - Abnormal; Notable for the following components:      Result Value   Potassium 3.0 (*)    Calcium 8.7 (*)    All other components within normal limits  CBC    EKG None  Radiology No results found.  Procedures .Marland KitchenIncision and Drainage  Date/Time: 09/30/2021 2:00 AM  Performed by: Melene Plan, DO Authorized by: Melene Plan, DO   Consent:    Consent obtained:  Verbal   Consent given by:  Patient   Risks, benefits, and alternatives were discussed: yes     Risks discussed:  Bleeding, incomplete drainage, infection and  damage to other organs   Alternatives discussed:  No treatment, delayed treatment and alternative treatment Universal protocol:    Procedure explained and questions answered to patient or proxy's satisfaction: yes     Patient identity confirmed:  Verbally with patient Location:    Type:  Abscess   Location:  Head   Head location:  R external ear Pre-procedure details:    Skin preparation:  Chlorhexidine Sedation:    Sedation type:  None Anesthesia:    Anesthesia method: pain eeze. Procedure type:    Complexity:  Complex Procedure details:    Ultrasound guidance: no     Needle aspiration: no     Incision types:  Single straight   Incision depth:  Subcutaneous   Drainage:  Purulent   Drainage amount:  Copious   Wound treatment:  Wound left open   Packing materials:  None Post-procedure details:    Procedure completion:  Tolerated well, no immediate complications     Medications Ordered in ED Medications  doxycycline (VIBRA-TABS) tablet 100 mg (has no administration in time range)  oxyCODONE-acetaminophen (PERCOCET/ROXICET) 5-325 MG per tablet 1 tablet (1 tablet Oral Given 09/29/21 2346)    ED Course/ Medical Decision Making/ A&P                           Medical Decision Making Risk Prescription drug management.   36 yo F with a chief complaints of an abscess to the external ear.  Going on for a couple days.  I&D was performed at bedside.  Patient was noted to be hypertensive at urgent care and was sent over to rule out hypertensive urgency/emergency.  Patient is chronically hypertensive per my record review.  Her blood pressure is likely slightly higher due to discomfort.  She has a benign neurologic exam.  No chest pain or difficulty breathing.  Do not feel that further work-up in the emergency department is warranted.  She has no leukocytosis.  No change in renal function.  2:02 AM:  I have discussed the diagnosis/risks/treatment options with the patient.  Evaluation  and diagnostic testing in the emergency department does not suggest an emergent condition requiring admission or immediate intervention beyond what has been performed at this time.  They will follow up with  PCP. We also discussed returning to the ED immediately if new or worsening sx occur. We discussed the  sx which are most concerning (e.g., sudden worsening pain, fever, inability to tolerate by mouth) that necessitate immediate return. Medications administered to the patient during their visit and any new prescriptions provided to the patient are listed below.  Medications given during this visit Medications  doxycycline (VIBRA-TABS) tablet 100 mg (has no administration in time range)  oxyCODONE-acetaminophen (PERCOCET/ROXICET) 5-325 MG per tablet 1 tablet (1 tablet Oral Given 09/29/21 2346)     The patient appears reasonably screen and/or stabilized for discharge and I doubt any other medical condition or other [facility] requiring further screening, evaluation, or treatment in the ED at this time prior to discharge.          Final Clinical Impression(s) / ED Diagnoses Final diagnoses:  Abscess    Rx / DC Orders ED Discharge Orders          Ordered    doxycycline (VIBRAMYCIN) 100 MG capsule  2 times daily        09/30/21 0149              Melene Plan, DO 09/30/21 0202

## 2021-09-30 NOTE — Discharge Instructions (Addendum)
Warm compresses at least 4 times a day.  Please follow-up with your family doctor in the office.  Please return for rapid spreading redness or if you develop a fever. 

## 2021-10-06 ENCOUNTER — Other Ambulatory Visit (HOSPITAL_COMMUNITY)
Admission: RE | Admit: 2021-10-06 | Discharge: 2021-10-06 | Disposition: A | Discharge status: Home / Self Care | Payer: Medicaid Other | Source: Ambulatory Visit | Attending: Infectious Diseases | Admitting: Infectious Diseases

## 2021-10-06 ENCOUNTER — Ambulatory Visit: Payer: Medicaid Other | Admitting: Infectious Diseases

## 2021-10-06 ENCOUNTER — Other Ambulatory Visit: Payer: Self-pay

## 2021-10-06 ENCOUNTER — Encounter: Payer: Self-pay | Admitting: Family

## 2021-10-06 ENCOUNTER — Ambulatory Visit (INDEPENDENT_AMBULATORY_CARE_PROVIDER_SITE_OTHER): Payer: Medicaid Other | Admitting: Family

## 2021-10-06 VITALS — BP 173/122 | HR 77 | Temp 98.0°F | Wt 247.0 lb

## 2021-10-06 DIAGNOSIS — Z0000 Encounter for general adult medical examination without abnormal findings: Secondary | ICD-10-CM

## 2021-10-06 DIAGNOSIS — Z113 Encounter for screening for infections with a predominantly sexual mode of transmission: Secondary | ICD-10-CM | POA: Diagnosis present

## 2021-10-06 DIAGNOSIS — B20 Human immunodeficiency virus [HIV] disease: Secondary | ICD-10-CM

## 2021-10-06 DIAGNOSIS — I10 Essential (primary) hypertension: Secondary | ICD-10-CM | POA: Diagnosis not present

## 2021-10-06 DIAGNOSIS — N926 Irregular menstruation, unspecified: Secondary | ICD-10-CM | POA: Diagnosis not present

## 2021-10-06 DIAGNOSIS — Z23 Encounter for immunization: Secondary | ICD-10-CM | POA: Diagnosis not present

## 2021-10-06 LAB — POCT URINE PREGNANCY: Preg Test, Ur: NEGATIVE

## 2021-10-06 MED ORDER — BIKTARVY 50-200-25 MG PO TABS: 1.0000 | ORAL_TABLET | Freq: Every day | ORAL | 4 refills | Status: DC

## 2021-10-06 NOTE — Assessment & Plan Note (Signed)
Blood pressure remains elevated above goal of 140/90 with current regimen of propranolol. Will likely will need additional medication. No neurological or ophthalmologic symptoms.

## 2021-10-06 NOTE — Assessment & Plan Note (Signed)
   Discussed importance of safe sexual practice and condom use. Condoms and STD testing offered.   Prevnar20 and Menveo updated.   Due for influenza vaccination.   Encouraged to complete routine dental care and can refer to [facility] clinic if needed.

## 2021-10-06 NOTE — Assessment & Plan Note (Signed)
Has had a couple of missed menstrual cycles and at home pregnancy testing negative. Check urine and serum pregnancy test.

## 2021-10-06 NOTE — Assessment & Plan Note (Signed)
Catherine Simmons continues to have well controlled virus with good adherence and tolerance to Biktarvy. Reviewed previous lab work and discussed plan of care. Continue current dose of Biktarvy. Check lab work today. Plan for follow up in 3 months or sooner if needed with lab work on the same day.
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
## 2021-10-06 NOTE — Patient Instructions (Addendum)
Nice to see you.  We will check your lab work today.  Continue to take your medication daily as prescribed.  Refills have been sent to the pharmacy.  Plan for follow up in 3 months or sooner if needed with lab work on the same day.  Have a great day and stay safe!  

## 2021-10-07 LAB — T-HELPER CELL (CD4) - (RCID CLINIC ONLY)
CD4 % Helper T Cell: 46 % (ref 33–65)
CD4 T Cell Abs: 875 /uL (ref 400–1790)

## 2021-10-07 LAB — URINE CYTOLOGY ANCILLARY ONLY
Chlamydia: NEGATIVE
Comment: NEGATIVE
Comment: NORMAL
Neisseria Gonorrhea: NEGATIVE

## 2021-10-08 LAB — RPR: RPR Ser Ql: NONREACTIVE

## 2021-10-08 LAB — HCG, SERUM, QUALITATIVE: Preg, Serum: NEGATIVE

## 2021-10-08 LAB — HIV-1 RNA QUANT-NO REFLEX-BLD
HIV 1 RNA Quant: NOT DETECTED Copies/mL
HIV-1 RNA Quant, Log: NOT DETECTED Log cps/mL

## 2021-11-29 ENCOUNTER — Emergency Department (HOSPITAL_COMMUNITY)
Admission: EM | Admit: 2021-11-29 | Discharge: 2021-11-30 | Disposition: A | Discharge status: Home / Self Care | Payer: Medicaid Other | Attending: Emergency Medicine | Admitting: Emergency Medicine

## 2021-11-29 ENCOUNTER — Encounter (HOSPITAL_COMMUNITY): Payer: Self-pay | Admitting: Emergency Medicine

## 2021-11-29 ENCOUNTER — Emergency Department (HOSPITAL_COMMUNITY): Payer: Medicaid Other

## 2021-11-29 DIAGNOSIS — Z21 Asymptomatic human immunodeficiency virus [HIV] infection status: Secondary | ICD-10-CM | POA: Diagnosis not present

## 2021-11-29 DIAGNOSIS — R300 Dysuria: Secondary | ICD-10-CM | POA: Diagnosis present

## 2021-11-29 DIAGNOSIS — D72829 Elevated white blood cell count, unspecified: Secondary | ICD-10-CM | POA: Insufficient documentation

## 2021-11-29 DIAGNOSIS — N83202 Unspecified ovarian cyst, left side: Secondary | ICD-10-CM | POA: Insufficient documentation

## 2021-11-29 DIAGNOSIS — N3090 Cystitis, unspecified without hematuria: Secondary | ICD-10-CM | POA: Diagnosis not present

## 2021-11-29 LAB — COMPREHENSIVE METABOLIC PANEL
ALT: 14 U/L (ref 0–44)
AST: 16 U/L (ref 15–41)
Albumin: 3.9 g/dL (ref 3.5–5.0)
Alkaline Phosphatase: 67 U/L (ref 38–126)
Anion gap: 9 (ref 5–15)
BUN: 15 mg/dL (ref 6–20)
CO2: 23 mmol/L (ref 22–32)
Calcium: 8.9 mg/dL (ref 8.9–10.3)
Chloride: 103 mmol/L (ref 98–111)
Creatinine, Ser: 0.64 mg/dL (ref 0.44–1.00)
GFR, Estimated: >60 mL/min (ref >60)
Glucose, Bld: 99 mg/dL (ref 70–99)
Potassium: 3.4 mmol/L — ABNORMAL LOW (ref 3.5–5.1)
Sodium: 135 mmol/L (ref 135–145)
Total Bilirubin: 0.8 mg/dL (ref 0.3–1.2)
Total Protein: 7.1 g/dL (ref 6.5–8.1)

## 2021-11-29 LAB — URINALYSIS, MICROSCOPIC (REFLEX)
Squamous Epithelial / HPF: NONE SEEN (ref 0–5)
WBC, UA: >50 WBC/hpf (ref 0–5)

## 2021-11-29 LAB — CBC
HCT: 35.4 % — ABNORMAL LOW (ref 36.0–46.0)
Hemoglobin: 12 g/dL (ref 12.0–15.0)
MCH: 28.4 pg (ref 26.0–34.0)
MCHC: 33.9 g/dL (ref 30.0–36.0)
MCV: 83.9 fL (ref 80.0–100.0)
Platelets: 200 10*3/uL (ref 150–400)
RBC: 4.22 MIL/uL (ref 3.87–5.11)
RDW: 12.7 % (ref 11.5–15.5)
WBC: 14.7 10*3/uL — ABNORMAL HIGH (ref 4.0–10.5)
nRBC: 0 % (ref 0.0–0.2)

## 2021-11-29 LAB — I-STAT BETA HCG BLOOD, ED (MC, WL, AP ONLY): I-stat hCG, quantitative: <5 m[IU]/mL (ref <5)

## 2021-11-29 LAB — URINALYSIS, ROUTINE W REFLEX MICROSCOPIC
Bilirubin Urine: NEGATIVE
Glucose, UA: NEGATIVE mg/dL
Ketones, ur: NEGATIVE mg/dL
Nitrite: NEGATIVE
Protein, ur: 30 mg/dL — AB
Specific Gravity, Urine: 1.015 (ref 1.005–1.030)
pH: 6 (ref 5.0–8.0)

## 2021-11-29 LAB — LIPASE, BLOOD: Lipase: 31 U/L (ref 11–51)

## 2021-11-29 MED ORDER — ONDANSETRON 4 MG PO TBDP
4.0000 mg | ORAL_TABLET | Freq: Once | ORAL | Status: AC
  Administered 2021-11-29: 4 mg via ORAL
  Filled 2021-11-29: qty 1

## 2021-11-29 MED ORDER — KETOROLAC TROMETHAMINE 15 MG/ML IJ SOLN
15.0000 mg | Freq: Once | INTRAMUSCULAR | Status: AC
  Administered 2021-11-29: 15 mg via INTRAMUSCULAR
  Filled 2021-11-29: qty 1

## 2021-11-29 NOTE — ED Triage Notes (Addendum)
Pt BIB [facility] from [facility]. She reports abdominal pain worsening x6 hours. Had an episode of urinary incontinence en route. Also reports burning with urination. One episode of vomiting with EMS.

## 2021-11-29 NOTE — ED Notes (Signed)
Delay on vitals PT currently in restroom changing clothes

## 2021-11-29 NOTE — ED Provider Triage Note (Signed)
Emergency Medicine Provider Triage Evaluation Note  Catherine Simmons , a 36 y.o. female  was evaluated in triage.  Pt complains of lower abdominal pain and L sided flank pain this afternoon. Also notes dysuria. Had episode of urinary incontinence in route. States her pain is "worse than giving birth". One episode of vomiting PTA.   Review of Systems  Positive: Abdominal pain, flank pain, dysuria, chills, vomiting Negative: Diarrhea, hematuria  Physical Exam  There were no vitals taken for this visit. Gen:   Awake, no distress   Resp:  Normal effort  MSK:   Moves extremities without difficulty  Other:    Medical Decision Making  Medically screening exam initiated at 9:59 PM.  Appropriate orders placed.  [name] was informed that the remainder of the evaluation will be completed by another provider, this initial triage assessment does not replace that evaluation, and the importance of remaining in the ED until their evaluation is complete.  Workup initiated   [name], Hershal Coria 11/29/21 2159

## 2021-11-30 MED ORDER — CEPHALEXIN 500 MG PO CAPS: 500.0000 mg | ORAL_CAPSULE | Freq: Two times a day (BID) | ORAL | 0 refills | Status: DC

## 2021-11-30 MED ORDER — OXYCODONE-ACETAMINOPHEN 5-325 MG PO TABS
2.0000 | ORAL_TABLET | Freq: Once | ORAL | Status: AC
  Administered 2021-11-30: 2 via ORAL
  Filled 2021-11-30: qty 2

## 2021-11-30 MED ORDER — CEPHALEXIN 500 MG PO CAPS
500.0000 mg | ORAL_CAPSULE | Freq: Once | ORAL | Status: AC
  Administered 2021-11-30: 500 mg via ORAL
  Filled 2021-11-30: qty 1

## 2021-11-30 MED ORDER — ONDANSETRON 4 MG PO TBDP: 4.0000 mg | ORAL_TABLET | Freq: Three times a day (TID) | ORAL | 0 refills | Status: DC | PRN

## 2021-11-30 NOTE — ED Provider Notes (Signed)
[facility]-EMERGENCY DEPT [facility] Emergency Department Provider Note MRN:  [id number]  Arrival date & time: 11/30/21     Chief Complaint   Abdominal Pain   History of Present Illness   Catherine Simmons is a 36 y.o. year-old female presents to the ED with chief complaint of patient presents to the emergency department with chief of left-sided flank pain and lower abdominal pain that started earlier tonight.  She reports associated dysuria, urinary frequency, and urgency.  She states that the pain comes in waves.  She denies history of kidney stones.  She had 1 episode of vomiting earlier.  She denies any treatments prior to arrival.  History notable for HIV, compliant on Biktarvy.  History provided by patient.   Review of Systems  Pertinent positive and negative review of systems noted in HPI.    Physical Exam   Vitals:   11/29/21 2202 11/30/21 0332  BP: (!) 154/87 (!) 119/93  Pulse: 88 62  Resp: 17 18  Temp: 98.5 F (36.9 C) 98 F (36.7 C)  SpO2: 100% 100%    CONSTITUTIONAL:  nontoxic-appearing, NAD NEURO:  Alert and oriented x 3, CN 3-12 grossly intact EYES:  eyes equal and reactive ENT/NECK:  Supple, no stridor  CARDIO:  normal rate, regular rhythm, appears well-perfused  PULM:  No respiratory distress, CTAB GI/GU:  non-distended, generalized lower abdominal discomfort MSK/SPINE:  No gross deformities, no edema, moves all extremities  SKIN:  no rash, atraumatic   *Additional and/or pertinent findings included in MDM below  Diagnostic and Interventional Summary     Labs Reviewed  COMPREHENSIVE METABOLIC PANEL - Abnormal; Notable for the following components:      Result Value   Potassium 3.4 (*)    All other components within normal limits  CBC - Abnormal; Notable for the following components:   WBC 14.7 (*)    HCT 35.4 (*)    All other components within normal limits  URINALYSIS, ROUTINE W REFLEX MICROSCOPIC - Abnormal; Notable for the following  components:   Hgb urine dipstick LARGE (*)    Protein, ur 30 (*)    Leukocytes,Ua LARGE (*)    All other components within normal limits  URINALYSIS, MICROSCOPIC (REFLEX) - Abnormal; Notable for the following components:   Bacteria, UA MANY (*)    All other components within normal limits  LIPASE, BLOOD  I-STAT BETA HCG BLOOD, ED ([facility], [facility], [facility] ONLY)    CT Renal Stone Study  Final Result      Medications  ketorolac (TORADOL) 15 MG/ML injection 15 mg (15 mg Intramuscular Given 11/29/21 2235)  ondansetron (ZOFRAN-ODT) disintegrating tablet 4 mg (4 mg Oral Given 11/29/21 2235)  cephALEXin (KEFLEX) capsule 500 mg (500 mg Oral Given 11/30/21 0358)  oxyCODONE-acetaminophen (PERCOCET/ROXICET) 5-325 MG per tablet 2 tablet (2 tablets Oral Given 11/30/21 0357)     Procedures  /  Critical Care Procedures  ED Course and Medical Decision Making  I have reviewed the triage vital signs, the nursing notes, and pertinent available records from the EMR.  Social Determinants Affecting Complexity of Care: Patient has no clinically significant social determinants affecting this chief complaint..   ED Course:    Medical Decision Making Patient came in with lower abdominal pain.  Also complains of dysuria, urgency, and frequency.  Labs are discussed as below.  CT ordered in triage shows no acute intra-abdominal process.  There is an incidental 4.1 cm left ovarian cyst, which I discussed with the patient.  Patient symptoms  are consistent with UTI, will treat with Keflex.  Reassuring work-up.  I do not think that the patient requires admission at this time.  Problems Addressed: Cyst of left ovary: acute illness or injury Cystitis: acute illness or injury  Amount and/or Complexity of Data Reviewed Labs: ordered.    Details: Mild leukocytosis to 14.7, no AKI, no electrolyte derangement, pregnancy test negative, abnormal urinalysis, will treat with Keflex Radiology: independent interpretation  performed.    Details: No obvious free air or ureteral stone  Risk Prescription drug management. Decision regarding hospitalization.     Consultants: No consultations were needed in caring for this patient.   Treatment and Plan: I considered admission due to patient's initial presentation, but after considering the examination and diagnostic results, patient will not require admission and can be discharged with outpatient follow-up.    Final Clinical Impressions(s) / ED Diagnoses     ICD-10-CM   1. Cystitis  N30.90     2. Cyst of left ovary  N83.202       ED Discharge Orders          Ordered    cephALEXin (KEFLEX) 500 MG capsule  2 times daily        11/30/21 0343    ondansetron (ZOFRAN-ODT) 4 MG disintegrating tablet  Every 8 hours PRN        11/30/21 0343              Discharge Instructions Discussed with and Provided to Patient:   Discharge Instructions   None      Montine Circle, PA-C 40/08/67 [id number]    Delora Fuel, MD 09/32/67 [phone]

## 2021-12-11 HISTORY — PX: SHOULDER SURGERY: SHX246

## 2021-12-25 ENCOUNTER — Ambulatory Visit: Payer: Self-pay | Admitting: Infectious Diseases

## 2021-12-25 NOTE — Telephone Encounter (Signed)
Patient rescheduled to 11/21 @ 9am

## 2021-12-30 ENCOUNTER — Ambulatory Visit: Payer: Medicaid Other | Admitting: Infectious Diseases

## 2021-12-30 NOTE — Telephone Encounter (Signed)
Patient scheduled on 11/27 with pharmacy
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
## 2022-01-05 ENCOUNTER — Other Ambulatory Visit (HOSPITAL_COMMUNITY)
Admission: RE | Admit: 2022-01-05 | Discharge: 2022-01-05 | Disposition: A | Discharge status: Home / Self Care | Payer: Medicaid Other | Source: Ambulatory Visit | Attending: Infectious Diseases | Admitting: Infectious Diseases

## 2022-01-05 ENCOUNTER — Other Ambulatory Visit: Payer: Self-pay

## 2022-01-05 ENCOUNTER — Ambulatory Visit (INDEPENDENT_AMBULATORY_CARE_PROVIDER_SITE_OTHER): Payer: Medicaid Other | Admitting: Pharmacist

## 2022-01-05 ENCOUNTER — Other Ambulatory Visit (HOSPITAL_COMMUNITY): Payer: Self-pay

## 2022-01-05 ENCOUNTER — Other Ambulatory Visit: Payer: Self-pay | Admitting: Pharmacist

## 2022-01-05 DIAGNOSIS — Z113 Encounter for screening for infections with a predominantly sexual mode of transmission: Secondary | ICD-10-CM

## 2022-01-05 DIAGNOSIS — B20 Human immunodeficiency virus [HIV] disease: Secondary | ICD-10-CM

## 2022-01-05 DIAGNOSIS — Z23 Encounter for immunization: Secondary | ICD-10-CM

## 2022-01-05 MED ORDER — BIKTARVY 50-200-25 MG PO TABS
1.0000 | ORAL_TABLET | Freq: Every day | ORAL | 1 refills | Status: DC
  Filled 2022-01-05 (×2): qty 30, 30d supply, fill #0
  Filled 2022-01-26: qty 30, 30d supply, fill #1

## 2022-01-06 ENCOUNTER — Other Ambulatory Visit (HOSPITAL_COMMUNITY): Payer: Self-pay

## 2022-01-07 LAB — HIV-1 RNA QUANT-NO REFLEX-BLD
HIV 1 RNA Quant: 196 Copies/mL — ABNORMAL HIGH
HIV-1 RNA Quant, Log: 2.29 Log cps/mL — ABNORMAL HIGH

## 2022-01-07 LAB — CYTOLOGY, (ORAL, ANAL, URETHRAL) ANCILLARY ONLY
Chlamydia: NEGATIVE
Comment: NEGATIVE
Comment: NORMAL
Neisseria Gonorrhea: NEGATIVE

## 2022-01-07 LAB — LIPID PANEL
Cholesterol: 169 mg/dL (ref <200)
HDL: 53 mg/dL (ref >=50)
LDL Cholesterol (Calc): 89 mg/dL (calc)
Non-HDL Cholesterol (Calc): 116 mg/dL (calc) (ref <130)
Total CHOL/HDL Ratio: 3.2 (calc) (ref <5.0)
Triglycerides: 171 mg/dL — ABNORMAL HIGH (ref <150)

## 2022-01-07 LAB — URINE CYTOLOGY ANCILLARY ONLY
Chlamydia: NEGATIVE
Comment: NEGATIVE
Comment: NEGATIVE
Comment: NORMAL
Neisseria Gonorrhea: NEGATIVE
Trichomonas: NEGATIVE

## 2022-01-07 LAB — RPR: RPR Ser Ql: NONREACTIVE

## 2022-01-15 ENCOUNTER — Emergency Department (HOSPITAL_COMMUNITY)
Admission: EM | Admit: 2022-01-15 | Discharge: 2022-01-15 | Disposition: A | Discharge status: Home / Self Care | Payer: Medicaid Other | Attending: Emergency Medicine | Admitting: Emergency Medicine

## 2022-01-15 ENCOUNTER — Other Ambulatory Visit: Payer: Self-pay

## 2022-01-15 ENCOUNTER — Emergency Department (HOSPITAL_COMMUNITY): Payer: Medicaid Other

## 2022-01-15 DIAGNOSIS — R519 Headache, unspecified: Secondary | ICD-10-CM | POA: Diagnosis present

## 2022-01-15 DIAGNOSIS — Z20822 Contact with and (suspected) exposure to covid-19: Secondary | ICD-10-CM | POA: Insufficient documentation

## 2022-01-15 DIAGNOSIS — I10 Essential (primary) hypertension: Secondary | ICD-10-CM | POA: Diagnosis not present

## 2022-01-15 LAB — CBC WITH DIFFERENTIAL/PLATELET
Abs Immature Granulocytes: 0.02 10*3/uL (ref 0.00–0.07)
Basophils Absolute: 0 10*3/uL (ref 0.0–0.1)
Basophils Relative: 1 %
Eosinophils Absolute: 0.1 10*3/uL (ref 0.0–0.5)
Eosinophils Relative: 1 %
HCT: 35.3 % — ABNORMAL LOW (ref 36.0–46.0)
Hemoglobin: 11.6 g/dL — ABNORMAL LOW (ref 12.0–15.0)
Immature Granulocytes: 0 %
Lymphocytes Relative: 26 %
Lymphs Abs: 1.4 10*3/uL (ref 0.7–4.0)
MCH: 27.6 pg (ref 26.0–34.0)
MCHC: 32.9 g/dL (ref 30.0–36.0)
MCV: 83.8 fL (ref 80.0–100.0)
Monocytes Absolute: 0.5 10*3/uL (ref 0.1–1.0)
Monocytes Relative: 8 %
Neutro Abs: 3.5 10*3/uL (ref 1.7–7.7)
Neutrophils Relative %: 64 %
Platelets: 208 10*3/uL (ref 150–400)
RBC: 4.21 MIL/uL (ref 3.87–5.11)
RDW: 13.4 % (ref 11.5–15.5)
WBC: 5.5 10*3/uL (ref 4.0–10.5)
nRBC: 0 % (ref 0.0–0.2)

## 2022-01-15 LAB — BASIC METABOLIC PANEL
Anion gap: 7 (ref 5–15)
BUN: 10 mg/dL (ref 6–20)
CO2: 22 mmol/L (ref 22–32)
Calcium: 8.8 mg/dL — ABNORMAL LOW (ref 8.9–10.3)
Chloride: 110 mmol/L (ref 98–111)
Creatinine, Ser: 0.52 mg/dL (ref 0.44–1.00)
GFR, Estimated: >60 mL/min (ref >60)
Glucose, Bld: 98 mg/dL (ref 70–99)
Potassium: 3.2 mmol/L — ABNORMAL LOW (ref 3.5–5.1)
Sodium: 139 mmol/L (ref 135–145)

## 2022-01-15 LAB — RESP PANEL BY RT-PCR (FLU A&B, COVID) ARPGX2
Influenza A by PCR: NEGATIVE
Influenza B by PCR: NEGATIVE
SARS Coronavirus 2 by RT PCR: NEGATIVE

## 2022-01-15 MED ORDER — MORPHINE SULFATE (PF) 4 MG/ML IV SOLN
4.0000 mg | Freq: Once | INTRAVENOUS | Status: AC
  Administered 2022-01-15: 4 mg via INTRAVENOUS
  Filled 2022-01-15: qty 1

## 2022-01-15 MED ORDER — OXYCODONE-ACETAMINOPHEN 5-325 MG PO TABS
2.0000 | ORAL_TABLET | Freq: Once | ORAL | Status: AC
  Administered 2022-01-15: 2 via ORAL
  Filled 2022-01-15: qty 2

## 2022-01-15 MED ORDER — DIPHENHYDRAMINE HCL 50 MG/ML IJ SOLN
50.0000 mg | Freq: Once | INTRAMUSCULAR | Status: AC
  Administered 2022-01-15: 50 mg via INTRAVENOUS
  Filled 2022-01-15: qty 1

## 2022-01-15 MED ORDER — METOCLOPRAMIDE HCL 5 MG/ML IJ SOLN
10.0000 mg | Freq: Once | INTRAMUSCULAR | Status: AC
  Administered 2022-01-15: 10 mg via INTRAVENOUS
  Filled 2022-01-15: qty 2

## 2022-01-15 MED ORDER — POTASSIUM CHLORIDE CRYS ER 20 MEQ PO TBCR
40.0000 meq | EXTENDED_RELEASE_TABLET | Freq: Once | ORAL | Status: AC
  Administered 2022-01-15: 40 meq via ORAL
  Filled 2022-01-15: qty 2

## 2022-01-15 MED ORDER — HYDRALAZINE HCL 20 MG/ML IJ SOLN
10.0000 mg | Freq: Once | INTRAMUSCULAR | Status: AC
  Administered 2022-01-15: 10 mg via INTRAVENOUS
  Filled 2022-01-15: qty 1

## 2022-01-15 MED ORDER — HYDRALAZINE HCL 20 MG/ML IJ SOLN
20.0000 mg | Freq: Once | INTRAMUSCULAR | Status: AC
  Administered 2022-01-15: 20 mg via INTRAVENOUS
  Filled 2022-01-15: qty 1

## 2022-01-15 MED ORDER — OXYCODONE-ACETAMINOPHEN 5-325 MG PO TABS: 1.0000 | ORAL_TABLET | Freq: Four times a day (QID) | ORAL | 0 refills | Status: DC | PRN

## 2022-01-15 NOTE — ED Provider Notes (Signed)
[facility]-EMERGENCY DEPT Provider Note   CSN: [id number] Arrival date & time: 01/15/22  8413     History  Chief Complaint  Patient presents with   Headache   Hypertension    Catherine Simmons is a 36 y.o. female.  Patient presents to the emergency department for evaluation of headache.  Headache began around 11 PM.  Patient reports that she does have a history of chronic high blood pressure, takes Inderal.  Patient reports that she has a diffuse throbbing headache and her blood pressure has been very elevated.  She has been taking all of her medications as prescribed.       Home Medications Prior to Admission medications   Medication Sig Start Date End Date Taking? Authorizing Provider  acetaminophen (TYLENOL) 500 MG tablet Take 500-1,000 mg by mouth every 6 (six) hours as needed for mild pain or headache. Patient not taking: Reported on 02/20/2021    [provider]  bictegravir-emtricitabine-tenofovir AF (BIKTARVY) 50-200-25 MG TABS tablet Take 1 tablet by mouth daily. 01/05/22   Kuppelweiser, Cassie L, RPH-CPP  cephALEXin (KEFLEX) 500 MG capsule Take 1 capsule (500 mg total) by mouth 2 (two) times daily. 11/30/21   Roxy Horseman, PA-C  diclofenac (VOLTAREN) 75 MG EC tablet Take 1 tablet (75 mg total) by mouth 2 (two) times daily. 06/03/21   Elson Areas, PA-C  doxycycline (VIBRAMYCIN) 100 MG capsule Take 1 capsule (100 mg total) by mouth 2 (two) times daily. One po bid x 7 days 09/30/21   Melene Plan, DO  Drospirenone (SLYND) 4 MG TABS Take 1 tablet by mouth daily. 03/04/21   Plummer Bing, MD  ibuprofen (ADVIL) 600 MG tablet Take 1 tablet (600 mg total) by mouth every 6 (six) hours as needed. 01/30/21   Constant, Peggy, MD  methocarbamol (ROBAXIN) 500 MG tablet Take 1 tablet (500 mg total) by mouth 4 (four) times daily. 06/03/21   Elson Areas, PA-C  ondansetron (ZOFRAN) 4 MG tablet Take 1 tablet (4 mg total) by mouth every 6 (six) hours.  07/18/21   Wynetta Fines, MD  ondansetron (ZOFRAN-ODT) 4 MG disintegrating tablet Take 1 tablet (4 mg total) by mouth every 8 (eight) hours as needed for nausea or vomiting. 11/30/21   Roxy Horseman, PA-C  propranolol (INDERAL) 40 MG tablet Take 1 tablet (40 mg total) by mouth 2 (two) times daily. 03/24/21   Blanchard Kelch, NP      Allergies    Butorphanol and Stadol [butorphanol tartrate]    Review of Systems   Review of Systems  Physical Exam Updated Vital Signs BP (!) 213/129 (BP Location: Left Arm) Comment: RN aware of elevated bp.  Pulse 91   Temp 97.9 F (36.6 C) (Oral)   Resp 18   SpO2 98%  Physical Exam Vitals and nursing note reviewed.  Constitutional:      General: She is not in acute distress.    Appearance: She is well-developed.  HENT:     Head: Normocephalic and atraumatic.     Mouth/Throat:     Mouth: Mucous membranes are moist.  Eyes:     General: Vision grossly intact. Gaze aligned appropriately.     Extraocular Movements: Extraocular movements intact.     Conjunctiva/sclera: Conjunctivae normal.  Cardiovascular:     Rate and Rhythm: Normal rate and regular rhythm.     Pulses: Normal pulses.     Heart sounds: Normal heart sounds, S1 normal and S2 normal. No murmur  heard.    No friction rub. No gallop.  Pulmonary:     Effort: Pulmonary effort is normal. No respiratory distress.     Breath sounds: Normal breath sounds.  Abdominal:     General: Bowel sounds are normal.     Palpations: Abdomen is soft.     Tenderness: There is no abdominal tenderness. There is no guarding or rebound.     Hernia: No hernia is present.  Musculoskeletal:        General: No swelling.     Cervical back: Full passive range of motion without pain, normal range of motion and neck supple. No spinous process tenderness or muscular tenderness. Normal range of motion.     Right lower leg: No edema.     Left lower leg: No edema.  Skin:    General: Skin is warm and dry.      Capillary Refill: Capillary refill takes less than 2 seconds.     Findings: No ecchymosis, erythema, rash or wound.  Neurological:     General: No focal deficit present.     Mental Status: She is alert and oriented to person, place, and time.     GCS: GCS eye subscore is 4. GCS verbal subscore is 5. GCS motor subscore is 6.     Cranial Nerves: Cranial nerves 2-12 are intact.     Sensory: Sensation is intact.     Motor: Motor function is intact.     Coordination: Coordination is intact.  Psychiatric:        Attention and Perception: Attention normal.        Mood and Affect: Mood normal.        Speech: Speech normal.        Behavior: Behavior normal.     ED Results / Procedures / Treatments   Labs (all labs ordered are listed, but only abnormal results are displayed) Labs Reviewed  CBC WITH DIFFERENTIAL/PLATELET - Abnormal; Notable for the following components:      Result Value   Hemoglobin 11.6 (*)    HCT 35.3 (*)    All other components within normal limits  RESP PANEL BY RT-PCR (FLU A&B, COVID) [id number]  BASIC METABOLIC PANEL    EKG None  Radiology CT HEAD WO CONTRAST  Result Date: 01/15/2022 CLINICAL DATA:  Headache, elevated blood pressure. EXAM: CT HEAD WITHOUT CONTRAST TECHNIQUE: Contiguous axial images were obtained from the base of the skull through the vertex without intravenous contrast. RADIATION DOSE REDUCTION: This exam was performed according to the departmental dose-optimization program which includes automated exposure control, adjustment of the mA and/or kV according to patient size and/or use of iterative reconstruction technique. COMPARISON:  01/14/2021. FINDINGS: Brain: No acute intracranial hemorrhage, midline shift or mass effect. No extra-axial fluid collection. Gray-white matter differentiation is within normal limits. Partially empty sella is unchanged. No hydrocephalus. Vascular: No hyperdense vessel or unexpected calcification. Skull: Normal. Negative for  fracture or focal lesion. Sinuses/Orbits: No acute finding. Other: None. IMPRESSION: No acute intracranial process. Electronically Signed   By: Thornell Sartorius M.D.   On: 01/15/2022 05:00    Procedures Procedures    Medications Ordered in ED Medications  morphine (PF) 4 MG/ML injection 4 mg (4 mg Intravenous Given 01/15/22 0650)  metoCLOPramide (REGLAN) injection 10 mg (10 mg Intravenous Given 01/15/22 0651)  diphenhydrAMINE (BENADRYL) injection 50 mg (50 mg Intravenous Given 01/15/22 0652)  hydrALAZINE (APRESOLINE) injection 20 mg (20 mg Intravenous Given 01/15/22 0086)    ED Course/ Medical  Decision Making/ A&P                           Medical Decision Making Amount and/or Complexity of Data Reviewed Labs: ordered. Radiology: ordered.  Risk Prescription drug management.   Presents with headache.  Patient found to be very hypertensive.  She does have a history of chronic hypertension, has been taking her medications.  Neuroexam is nonfocal, no stroke symptoms.  CT head unremarkable.  Treat with IV hydralazine and analgesia for headache. Will sign out to oncoming ER physician.        Final Clinical Impression(s) / ED Diagnoses Final diagnoses:  Bad headache  Hypertension, unspecified type    Rx / DC Orders ED Discharge Orders     None         Dyke Weible, Canary Brim, MD 01/15/22 [phone]

## 2022-01-15 NOTE — Discharge Instructions (Signed)
Follow-up with your doctor for blood pressure medication management

## 2022-01-15 NOTE — ED Triage Notes (Signed)
Patient coming to ED for evaluation of headache and hypertension.  Reports headache started late in the evening.  Hx of hypertension and has taken prescription medications.  Having nausea and body aches

## 2022-01-15 NOTE — ED Provider Notes (Signed)
Patient is found today by Dr. Oletta Cohn pending results of blood pressure medication as well as electrolytes.  Patient renal function is improved.  She did require an additional dose of hydralazine her blood pressure is back within a reasonable range.  Will discharge patient at this time.  She is mildly hypokalemic will be given oral potassium.  Will follow-up with her doctor   Lorre Nick, MD 01/15/22 1204

## 2022-01-26 ENCOUNTER — Other Ambulatory Visit (HOSPITAL_COMMUNITY): Payer: Self-pay

## 2022-02-03 ENCOUNTER — Other Ambulatory Visit (HOSPITAL_COMMUNITY): Payer: Self-pay

## 2022-02-05 ENCOUNTER — Telehealth: Payer: Self-pay

## 2022-02-05 ENCOUNTER — Inpatient Hospital Stay (HOSPITAL_COMMUNITY)
Admission: AD | Admit: 2022-02-05 | Discharge: 2022-02-06 | Disposition: A | Discharge status: Home / Self Care | Payer: Medicaid Other | Attending: Obstetrics & Gynecology | Admitting: Obstetrics & Gynecology

## 2022-02-05 ENCOUNTER — Encounter (HOSPITAL_COMMUNITY): Payer: Self-pay | Admitting: Obstetrics & Gynecology

## 2022-02-05 DIAGNOSIS — N939 Abnormal uterine and vaginal bleeding, unspecified: Secondary | ICD-10-CM | POA: Diagnosis present

## 2022-02-05 DIAGNOSIS — I10 Essential (primary) hypertension: Secondary | ICD-10-CM

## 2022-02-05 DIAGNOSIS — Z3202 Encounter for pregnancy test, result negative: Secondary | ICD-10-CM | POA: Diagnosis not present

## 2022-02-05 DIAGNOSIS — N926 Irregular menstruation, unspecified: Secondary | ICD-10-CM | POA: Diagnosis not present

## 2022-02-05 LAB — CBC
HCT: 33.3 % — ABNORMAL LOW (ref 36.0–46.0)
Hemoglobin: 11.4 g/dL — ABNORMAL LOW (ref 12.0–15.0)
MCH: 28.1 pg (ref 26.0–34.0)
MCHC: 34.2 g/dL (ref 30.0–36.0)
MCV: 82 fL (ref 80.0–100.0)
Platelets: 206 10*3/uL (ref 150–400)
RBC: 4.06 MIL/uL (ref 3.87–5.11)
RDW: 13.5 % (ref 11.5–15.5)
WBC: 6.4 10*3/uL (ref 4.0–10.5)
nRBC: 0 % (ref 0.0–0.2)

## 2022-02-05 LAB — POCT PREGNANCY, URINE: Preg Test, Ur: NEGATIVE

## 2022-02-05 NOTE — Telephone Encounter (Signed)
Patient called c/o headaches and bleeding more than usual while on menstrual cycle. I advised patient to contact her OBGYN, contact information given, if unable to get in with them she may need to go to ED due to the heavy menstrual bleeding. Patient voiced her understanding.    Liesl Simons Lesli Albee, CMA

## 2022-02-05 NOTE — MAU Note (Signed)
.  Catherine Simmons is a 36 y.o. at Unknown here in [facility] reporting vaginal bleeding since 12/11. She had negative upt in Nov and then positive upt in Dec. Using 5-8 #5pads daily with some clots.  LMP: 11/11/21 Onset of complaint: 12/11 Pain score: 5 Vitals:   02/05/22 2201  Pulse: 62  Resp: 17  Temp: 97.8 F (36.6 C)  SpO2: 100%     FHT:n/a Lab orders placed from triage:  upt

## 2022-02-05 NOTE — MAU Provider Note (Incomplete)
None     Chief Complaint:  Vaginal Bleeding   Catherine Simmons is  36 y.o. [id number].  Patient's last menstrual period was 11/11/2021.Marland Kitchen  Her pregnancy status is {Neg/pos/unk:12251}.  She presents complaining of Vaginal Bleeding . Per RN triage note:    .Catherine Simmons is a 36 y.o. at Unknown here in MAU reporting vaginal bleeding since 12/11. She had negative upt in Nov and then positive upt in Dec. Using 5-8 #5pads daily with some clots.  LMP: 11/11/21 Onset of complaint: 12/11   Does not have a PCP  Past Medical History:  Diagnosis Date  . Anemia   . Cholecystitis 07/16/2010  . Depression   . Genital warts 2004  . Headache   . Hemorrhoids   . HIV (human immunodeficiency virus infection) (HCC)   . Hx of pelvic inflammatory disease 08/31/2013   And hx of multiple STDs   . Hypertension   . Influenza A 01/17/2021  . Ovarian cyst   . Pregnancy induced hypertension   . Sickle cell trait (HCC)   . UTI (urinary tract infection)     Past Surgical History:  Procedure Laterality Date  . CHOLECYSTECTOMY  07/19/2010  . DILATION AND EVACUATION N/A 01/30/2021   Procedure: DILATATION AND EVACUATION;  Surgeon: Catalina Antigua, MD;  Location: [facility];  Service: Gynecology;  Laterality: N/A;  . EXTERNAL CEPHALIC VERSION  12/11/2016      . WISDOM TOOTH EXTRACTION      Family History  Problem Relation Age of Onset  . Cancer Mother   . Hypertension Mother   . Diabetes Mother   . Pancreatitis Mother   . Hypertension Father   . Diabetes Maternal Aunt     Social History   Tobacco Use  . Smoking status: Former    Years: 15.00    Types: Cigarettes    Start date: 03/11/2012    Quit date: 09/2020    Years since quitting: 1.4  . Smokeless tobacco: Never  Vaping Use  . Vaping Use: Never used  Substance Use Topics  . Alcohol use: Not Currently    Comment: occ  . Drug use: Not Currently    Frequency: 5.0 times per week    Types: Marijuana    Comment: last use nov 2022     Allergies:  Allergies  Allergen Reactions  . Butorphanol Itching    Tolerates percocet  . Stadol [Butorphanol Tartrate] Itching    Tolerates percocet    Medications Prior to Admission  Medication Sig Dispense Refill Last Dose  . bictegravir-emtricitabine-tenofovir AF (BIKTARVY) 50-200-25 MG TABS tablet Take 1 tablet by mouth daily. 30 tablet 1 02/05/2022  . propranolol (INDERAL) 40 MG tablet Take 1 tablet (40 mg total) by mouth 2 (two) times daily. 60 tablet 2 02/05/2022  . acetaminophen (TYLENOL) 500 MG tablet Take 500-1,000 mg by mouth every 6 (six) hours as needed for mild pain or headache. (Patient not taking: Reported on 02/20/2021)     . cephALEXin (KEFLEX) 500 MG capsule Take 1 capsule (500 mg total) by mouth 2 (two) times daily. 20 capsule 0   . diclofenac (VOLTAREN) 75 MG EC tablet Take 1 tablet (75 mg total) by mouth 2 (two) times daily. (Patient not taking: Reported on 02/05/2022) 20 tablet 0 Not Taking  . doxycycline (VIBRAMYCIN) 100 MG capsule Take 1 capsule (100 mg total) by mouth 2 (two) times daily. One po bid x 7 days 14 capsule 0   . Drospirenone (SLYND) 4 MG TABS Take  1 tablet by mouth daily. 84 tablet 3   . ibuprofen (ADVIL) 600 MG tablet Take 1 tablet (600 mg total) by mouth every 6 (six) hours as needed. 60 tablet 3   . methocarbamol (ROBAXIN) 500 MG tablet Take 1 tablet (500 mg total) by mouth 4 (four) times daily. (Patient not taking: Reported on 02/05/2022) 20 tablet 0 Not Taking  . ondansetron (ZOFRAN) 4 MG tablet Take 1 tablet (4 mg total) by mouth every 6 (six) hours. (Patient not taking: Reported on 02/05/2022) 12 tablet 0 Not Taking  . ondansetron (ZOFRAN-ODT) 4 MG disintegrating tablet Take 1 tablet (4 mg total) by mouth every 8 (eight) hours as needed for nausea or vomiting. 10 tablet 0   . oxyCODONE-acetaminophen (PERCOCET/ROXICET) 5-325 MG tablet Take 1 tablet by mouth every 6 (six) hours as needed for severe pain. 10 tablet 0      Review of Systems    Constitutional: Negative for fever and chills Eyes: Negative for visual disturbances Respiratory: Negative for shortness of breath, dyspnea Cardiovascular: Negative for chest pain or palpitations  Gastrointestinal: Negative for vomiting, diarrhea and constipation Genitourinary: Negative for dysuria and urgency Musculoskeletal: Negative for back pain, joint pain, myalgias  Neurological: Negative for dizziness and headaches     Physical Exam   Blood pressure (!) 160/90, pulse 62, temperature 97.8 F (36.6 C), resp. rate 17, height 5\' 2"  (1.575 m), weight 103 kg, last menstrual period 11/11/2021, SpO2 100 %.  General: General appearance - alert, well appearing, and in no distress and oriented to person, place, and time Chest - normal respiratory effort Heart - normal rate and regular rhythm Abdomen - {:315920} Pelvic - {:315900} Extremities - no pedal edema noted   Labs: Results for orders placed or performed during the hospital encounter of 02/05/22 (from the past 24 hour(s))  Pregnancy, urine POC   Collection Time: 02/05/22 10:25 PM  Result Value Ref Range   Preg Test, Ur NEGATIVE NEGATIVE       Assessment: Patient Active Problem List   Diagnosis Date Noted  . Healthcare maintenance 10/06/2021  . Missed period 10/06/2021  . Headache 01/16/2021  . Vaginal discomfort 09/30/2020  . Menorrhagia with irregular cycle 09/30/2020  . Abdominal pain, left lower quadrant 06/20/2020  . Fatty liver 06/20/2020  . Amenorrhea 11/09/2019  . Iron deficiency anemia 05/13/2018  . Fatigue 01/03/2018  . Sickle cell trait (HCC) 07/22/2016  . BMI 40.0-44.9, adult (HCC) 07/22/2016  . Hidradenitis suppurativa 05/13/2016  . Marijuana use 12/26/2014  . Chronic hypertension 03/19/2014  . Anxiety 06/30/2013  . Human immunodeficiency virus (HIV) disease (HCC) 10/12/2012    Plan: 12/12/2012 [place]

## 2022-02-05 NOTE — MAU Provider Note (Cosign Needed)
None     Chief Complaint:  Vaginal Bleeding   Catherine Simmons is  36 y.o. [id number].  Patient's last menstrual period was 11/11/2021.Marland Kitchen  Her pregnancy status is negative.  She presents complaining of Vaginal Bleeding . Per RN triage note:    .Catherine Simmons is a 36 y.o. at Unknown here in MAU reporting vaginal bleeding since 12/11. She had negative upt in Nov and then positive upt in Dec. Using 5-8 #5pads daily with some clots.  LMP: 11/11/21 Onset of complaint: 12/11   Does not have a PCP--referral sent to [facility] and pt given phone number to call for an appointment  Past Medical History:  Diagnosis Date   Anemia    Cholecystitis 07/16/2010   Depression    Genital warts 2004   Headache    Hemorrhoids    HIV (human immunodeficiency virus infection) (HCC)    Hx of pelvic inflammatory disease 08/31/2013   And hx of multiple STDs    Hypertension    Influenza A 01/17/2021   Ovarian cyst    Pregnancy induced hypertension    Sickle cell trait (HCC)    UTI (urinary tract infection)     Past Surgical History:  Procedure Laterality Date   CHOLECYSTECTOMY  07/19/2010   DILATION AND EVACUATION N/A 01/30/2021   Procedure: DILATATION AND EVACUATION;  Surgeon: Catalina Antigua, MD;  Location: [facility] OR;  Service: Gynecology;  Laterality: N/A;   EXTERNAL CEPHALIC VERSION  12/11/2016       WISDOM TOOTH EXTRACTION      Family History  Problem Relation Age of Onset   Cancer Mother    Hypertension Mother    Diabetes Mother    Pancreatitis Mother    Hypertension Father    Diabetes Maternal Aunt     Social History   Tobacco Use   Smoking status: Former    Years: 15.00    Types: Cigarettes    Start date: 03/11/2012    Quit date: 09/2020    Years since quitting: 1.4   Smokeless tobacco: Never  Vaping Use   Vaping Use: Never used  Substance Use Topics   Alcohol use: Not Currently    Comment: occ   Drug use: Not Currently    Frequency: 5.0 times per week     Types: Marijuana    Comment: last use nov 2022    Allergies:  Allergies  Allergen Reactions   Butorphanol Itching    Tolerates percocet   Stadol [Butorphanol Tartrate] Itching    Tolerates percocet    No medications prior to admission.     Review of Systems   Constitutional: Negative for fever and chills Eyes: Negative for visual disturbances Respiratory: Negative for shortness of breath, dyspnea Cardiovascular: Negative for chest pain or palpitations  Gastrointestinal: Negative for vomiting, diarrhea and constipation Genitourinary: Negative for dysuria and urgency Musculoskeletal: Negative for back pain, joint pain, myalgias  Neurological: Negative for dizziness and headaches     Physical Exam   Blood pressure (!) 188/109, pulse 63, temperature 97.8 F (36.6 C), resp. rate 17, height 5\' 2"  (1.575 m), weight 103 kg, last menstrual period 11/11/2021, SpO2 100 %.  General: General appearance - alert, well appearing, and in no distress and oriented to person, place, and time Chest - normal respiratory effort Heart - normal rate and regular rhythm Abdomen - soft, nontender, nondistended, no masses or organomegaly Pelvic - normal external genitalia, vulva, vagina, cervix, uterus and adnexa, SSE:  scant amount of  dark blood in vault. Cx non friable, discharge appears normal. No odor Extremities - no pedal edema noted   Labs: Results for orders placed or performed during the hospital encounter of 02/05/22 (from the past 24 hour(s))  Pregnancy, urine POC   Collection Time: 02/05/22 10:25 PM  Result Value Ref Range   Preg Test, Ur NEGATIVE NEGATIVE  CBC   Collection Time: 02/05/22 11:25 PM  Result Value Ref Range   WBC 6.4 4.0 - 10.5 K/uL   RBC 4.06 3.87 - 5.11 MIL/uL   Hemoglobin 11.4 (L) 12.0 - 15.0 g/dL   HCT 33.3 (L) 36.0 - 46.0 %   MCV 82.0 80.0 - 100.0 fL   MCH 28.1 26.0 - 34.0 pg   MCHC 34.2 30.0 - 36.0 g/dL   RDW 13.5 11.5 - 15.5 %   Platelets 206 150 -  400 K/uL   nRBC 0.0 0.0 - 0.2 %  hCG, quantitative, pregnancy   Collection Time: 02/05/22 11:25 PM  Result Value Ref Range   hCG, Beta Chain, Quant, S <1 <5 mIU/mL       Assessment: Menses, prolonged Mildly anemic, stable   Plan: Pt requests to start birth control.  Rx for [facility] reordered (from 1 year ago). To get future refills from PCP, referral made  Christin Fudge

## 2022-02-06 ENCOUNTER — Encounter (HOSPITAL_BASED_OUTPATIENT_CLINIC_OR_DEPARTMENT_OTHER): Payer: Self-pay

## 2022-02-06 DIAGNOSIS — N926 Irregular menstruation, unspecified: Secondary | ICD-10-CM

## 2022-02-06 LAB — HCG, QUANTITATIVE, PREGNANCY: hCG, Beta Chain, Quant, S: <1 m[IU]/mL (ref <5)

## 2022-02-06 MED ORDER — SLYND 4 MG PO TABS: 1.0000 | ORAL_TABLET | Freq: Every day | ORAL | 0 refills | Status: DC

## 2022-02-06 NOTE — Discharge Instructions (Addendum)
I sent in a referral to [facility]:  call and make an appointment for your blood pressure

## 2022-02-06 NOTE — Progress Notes (Addendum)
Cathie Beams CNM in earlier to discuss test results and d/c plan. Written and verbal d/c instructions given and understanding voiced..Reminded pt to be sure and take her b/p med when she gets home. Pt voiced understanding

## 2022-02-11 ENCOUNTER — Other Ambulatory Visit: Payer: Self-pay

## 2022-02-11 ENCOUNTER — Ambulatory Visit: Payer: Medicaid Other | Admitting: Family

## 2022-02-12 ENCOUNTER — Ambulatory Visit: Payer: Medicaid Other | Admitting: Family

## 2022-02-24 ENCOUNTER — Other Ambulatory Visit (HOSPITAL_COMMUNITY): Payer: Self-pay

## 2022-02-26 ENCOUNTER — Other Ambulatory Visit: Payer: Self-pay | Admitting: Pharmacist

## 2022-02-26 ENCOUNTER — Other Ambulatory Visit (HOSPITAL_COMMUNITY): Payer: Self-pay

## 2022-02-26 DIAGNOSIS — B20 Human immunodeficiency virus [HIV] disease: Secondary | ICD-10-CM

## 2022-02-26 MED ORDER — BIKTARVY 50-200-25 MG PO TABS
1.0000 | ORAL_TABLET | Freq: Every day | ORAL | 0 refills | Status: DC
  Filled 2022-02-26: qty 30, 30d supply, fill #0

## 2022-03-10 ENCOUNTER — Ambulatory Visit: Payer: Medicaid Other

## 2022-03-13 ENCOUNTER — Other Ambulatory Visit (HOSPITAL_COMMUNITY): Payer: Self-pay

## 2022-03-13 ENCOUNTER — Encounter: Payer: Self-pay | Admitting: Physician Assistant

## 2022-03-13 ENCOUNTER — Other Ambulatory Visit: Payer: Self-pay

## 2022-03-13 ENCOUNTER — Ambulatory Visit (INDEPENDENT_AMBULATORY_CARE_PROVIDER_SITE_OTHER): Payer: Medicaid Other | Admitting: Physician Assistant

## 2022-03-13 VITALS — BP 170/125 | HR 54 | Temp 98.3°F | Resp 16 | Wt 223.4 lb

## 2022-03-13 DIAGNOSIS — N921 Excessive and frequent menstruation with irregular cycle: Secondary | ICD-10-CM

## 2022-03-13 DIAGNOSIS — G8929 Other chronic pain: Secondary | ICD-10-CM

## 2022-03-13 DIAGNOSIS — I10 Essential (primary) hypertension: Secondary | ICD-10-CM

## 2022-03-13 DIAGNOSIS — Z6841 Body Mass Index (BMI) 40.0 and over, adult: Secondary | ICD-10-CM

## 2022-03-13 DIAGNOSIS — D573 Sickle-cell trait: Secondary | ICD-10-CM

## 2022-03-13 DIAGNOSIS — B20 Human immunodeficiency virus [HIV] disease: Secondary | ICD-10-CM | POA: Diagnosis present

## 2022-03-13 DIAGNOSIS — Z30013 Encounter for initial prescription of injectable contraceptive: Secondary | ICD-10-CM

## 2022-03-13 DIAGNOSIS — Z3202 Encounter for pregnancy test, result negative: Secondary | ICD-10-CM

## 2022-03-13 DIAGNOSIS — Z3042 Encounter for surveillance of injectable contraceptive: Secondary | ICD-10-CM | POA: Diagnosis not present

## 2022-03-13 DIAGNOSIS — M25511 Pain in right shoulder: Secondary | ICD-10-CM

## 2022-03-13 LAB — POCT URINE PREGNANCY: Preg Test, Ur: NEGATIVE

## 2022-03-13 MED ORDER — MEDROXYPROGESTERONE ACETATE 150 MG/ML IM SUSP
150.0000 mg | Freq: Once | INTRAMUSCULAR | Status: AC
  Administered 2022-03-13: 150 mg via INTRAMUSCULAR

## 2022-03-13 MED ORDER — BIKTARVY 50-200-25 MG PO TABS
1.0000 | ORAL_TABLET | Freq: Every day | ORAL | 5 refills | Status: DC
  Filled 2022-03-13 – 2022-03-25 (×2): qty 30, 30d supply, fill #0
  Filled 2022-04-21: qty 30, 30d supply, fill #1
  Filled 2022-05-29: qty 30, 30d supply, fill #2
  Filled 2022-06-22: qty 30, 30d supply, fill #3
  Filled 2022-09-02: qty 30, 30d supply, fill #4

## 2022-03-13 MED ORDER — AMLODIPINE BESYLATE 10 MG PO TABS
10.0000 mg | ORAL_TABLET | Freq: Every day | ORAL | 2 refills | Status: DC
  Filled 2022-03-13: qty 30, 30d supply, fill #0
  Filled 2022-05-29: qty 30, 30d supply, fill #1
  Filled 2022-06-22: qty 30, 30d supply, fill #2

## 2022-03-13 MED ORDER — HYDROCHLOROTHIAZIDE 12.5 MG PO TABS
12.5000 mg | ORAL_TABLET | Freq: Every day | ORAL | 2 refills | Status: DC
  Filled 2022-03-13: qty 30, 30d supply, fill #0
  Filled 2022-05-29: qty 30, 30d supply, fill #1
  Filled 2022-06-22: qty 30, 30d supply, fill #2
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
## 2022-03-13 NOTE — Patient Instructions (Addendum)
Depo Provera start today, discontinue oral contraceptive Uncontrolled hypertention with propranolol 40 mg twice daily Add back amlodipine 10 mg and HCTZ 12.5 mg once daily monitor blood pressure Referral to primary care provider to address chronic hypertension and menorrhagia Refilled biktarvy continue taking as directed Follow up with Colletta [place] in 4 months Urine pregnancy today negative

## 2022-03-15 LAB — COMPLETE METABOLIC PANEL WITH GFR
AG Ratio: 1.3 (calc) (ref 1.0–2.5)
ALT: 9 U/L (ref 6–29)
AST: 8 U/L — ABNORMAL LOW (ref 10–30)
Albumin: 3.7 g/dL (ref 3.6–5.1)
Alkaline phosphatase (APISO): 57 U/L (ref 31–125)
BUN: 13 mg/dL (ref 7–25)
CO2: 25 mmol/L (ref 20–32)
Calcium: 8.6 mg/dL (ref 8.6–10.2)
Chloride: 110 mmol/L (ref 98–110)
Creat: 0.63 mg/dL (ref 0.50–0.97)
Globulin: 2.8 g/dL (calc) (ref 1.9–3.7)
Glucose, Bld: 89 mg/dL (ref 65–99)
Potassium: 3.9 mmol/L (ref 3.5–5.3)
Sodium: 141 mmol/L (ref 135–146)
Total Bilirubin: 0.3 mg/dL (ref 0.2–1.2)
Total Protein: 6.5 g/dL (ref 6.1–8.1)
eGFR: 118 mL/min/{1.73_m2} (ref >=60)

## 2022-03-15 LAB — CBC WITH DIFFERENTIAL/PLATELET
Absolute Monocytes: 374 cells/uL (ref 200–950)
Basophils Absolute: 31 cells/uL (ref 0–200)
Basophils Relative: 0.6 %
Eosinophils Absolute: 73 cells/uL (ref 15–500)
Eosinophils Relative: 1.4 %
HCT: 36.6 % (ref 35.0–45.0)
Hemoglobin: 12.3 g/dL (ref 11.7–15.5)
Lymphs Abs: 2189 cells/uL (ref 850–3900)
MCH: 28.4 pg (ref 27.0–33.0)
MCHC: 33.6 g/dL (ref 32.0–36.0)
MCV: 84.5 fL (ref 80.0–100.0)
MPV: 10.6 fL (ref 7.5–12.5)
Monocytes Relative: 7.2 %
Neutro Abs: 2532 cells/uL (ref 1500–7800)
Neutrophils Relative %: 48.7 %
Platelets: 226 10*3/uL (ref 140–400)
RBC: 4.33 10*6/uL (ref 3.80–5.10)
RDW: 12.8 % (ref 11.0–15.0)
Total Lymphocyte: 42.1 %
WBC: 5.2 10*3/uL (ref 3.8–10.8)

## 2022-03-15 LAB — LIPID PANEL
Cholesterol: 175 mg/dL (ref <200)
HDL: 52 mg/dL (ref >=50)
LDL Cholesterol (Calc): 108 mg/dL (calc) — ABNORMAL HIGH
Non-HDL Cholesterol (Calc): 123 mg/dL (calc) (ref <130)
Total CHOL/HDL Ratio: 3.4 (calc) (ref <5.0)
Triglycerides: 68 mg/dL (ref <150)

## 2022-03-15 LAB — T-HELPER CELLS (CD4) COUNT (NOT AT ARMC)
Absolute CD4: 1122 cells/uL (ref 490–1740)
CD4 T Helper %: 48 % (ref 30–61)
Total lymphocyte count: 2338 cells/uL (ref 850–3900)

## 2022-03-15 LAB — HIV-1 RNA QUANT-NO REFLEX-BLD
HIV 1 RNA Quant: NOT DETECTED Copies/mL
HIV-1 RNA Quant, Log: NOT DETECTED Log cps/mL

## 2022-03-24 ENCOUNTER — Ambulatory Visit (INDEPENDENT_AMBULATORY_CARE_PROVIDER_SITE_OTHER): Payer: Medicaid Other | Admitting: Obstetrics and Gynecology

## 2022-03-24 ENCOUNTER — Encounter: Payer: Self-pay | Admitting: Obstetrics and Gynecology

## 2022-03-24 VITALS — BP 155/107 | HR 82 | Ht 62.0 in | Wt 215.0 lb

## 2022-03-24 DIAGNOSIS — I10 Essential (primary) hypertension: Secondary | ICD-10-CM

## 2022-03-24 DIAGNOSIS — N939 Abnormal uterine and vaginal bleeding, unspecified: Secondary | ICD-10-CM | POA: Diagnosis not present

## 2022-03-24 MED ORDER — NORETHINDRONE ACETATE 5 MG PO TABS: 10.0000 mg | ORAL_TABLET | Freq: Every day | ORAL | 1 refills | Status: DC

## 2022-03-24 NOTE — Patient Instructions (Signed)
[place] [phone]) [facility] Owens Shark, MD; Erin Hearing, MD; Gwendlyn Deutscher, MD; Andria Frames, MD; McDiarmid, MD; Dutch Quint, [employer] [place], [place] [address] [phone] Mon-Fri 8:30-12:30, 1:30-5:00  Providers come to see babies during newborn hospitalization Only accepting infants of Mother's who are seen at [facility] or have siblings seen at   [place] Medicaid - Yes; [place], MD [address] [place], [place] [address] [phone] Mon, Tue, Thur, Fri 8:30-5:00, Wed 10:00-7:00 (closed 1-2pm daily for lunch) [facility] residents with no insurance.  [place] only with Medicaid/insurance; Tricare - no  [facility] for Children [facility]) - Tim and [facility], MD; Owens Shark, MD; Tamera Punt, MD; Doneen Poisson, MD; Fatima Sanger, MD; Lindwood Qua, MD; Thornell Sartorius, MD; Ronnald Ramp,  MD; Wynetta Emery, MD; Jess Barters, MD; Tami Ribas, MD; Derrell Lolling, MD; Dorothyann Peng, MD; Lucious Groves, NP [address], [place], [place] [address] [phone] Mon, Tue, Thur, Fri 8:30-5:30, Wed 9:30-5:30, Sat 8:30-12:30 Only accepting infants of first-time parents or siblings of current patients Hospital discharge coordinator will make follow-up appointment Medicaid - yes; Tricare - yes  [place] [phone]) [facility] Garnette Czech, MD; Rosana Hoes, MD; Servando Salina, MD; Rose Fillers, MD; Corinna Capra, MD; [facility], MD; Javier Glazier, MD; Janann Colonel, MD; Jimmye Norman, [place], [place], [place] [address] [phone] Mon-Fri 8:30-5:00, closed for lunch 12:30-1:30; Sat-Sun 10:00-1:00 Accepting Newborns with commercial insurance only, must call prior to delivery to be accepted into  practice.  Medicaid - no, Tricare - yes   [place] [place], [place] [address] [phone] or [phone] Mon to Fri 8am to 10pm, Sat 8am to 1pm  (virtual only on weekends) Only accepts Medicaid Healthy Blue pts  [facility]) - Pediatrics at [place] Boga, MD; Vilma Prader, MD; Vanita Panda, MD; Roxanne Mins, NP; Nestor Lewandowsky, MD; Ronney Lion, MD [address] [place], [place] [address] [phone] Mon-Fri 8:30-5:30 Medicaid - yes, Tricare - yes  [place] [phone]) [facility] Burn[facility]ette Pop, MD [address] [place] [address], [place], [place] [address] [phone] Sammuel Cooper, Wed Fri 8:30-5:00, Sat 8:30-12:00, Closed Thursdays Accepting siblings of established patients and first time mom's if you call prenatally Medicaid- yes; Tricare - yes  [place] at Arlina Robes, [place]; Mannie Stabile, MD; Jerald Kief; [place], [place]; Nancy Fetter, MD; Moreen Fowler, MD;  [address] [place], [place] [address] [phone] Mon-Fri 8:30-5:00, closed for lunch 1-2 Only accepting newborns of established patients Medicaid- no; Tricare - yes  [facility] [phone]) [place] at Leane Para, MD; [place], [place], [place] [address] [phone] Mon-Fri 8:00-5:00 Medicaid - No; Tricare - Yes  [place] at [facility], [place]; [place], [place], [place], [place] [address] [phone] Mon-Fri 8:00-5:00 Medicaid - No, Tricare - Yes  [facility] Abner Greenspan, MD; Sheran Lawless, MD; [place], [place] [address] [place] [address] [place], [place] [address] [phone]  Mon-Fri 8:00-5:00 Medicaid - No; Tricare - Yes  [address] [place], [place] [address] [phone] Mon-Fri 8:30-5:00 (lunch 12:00-1:00) Medicaid -Yes; [place] at [place], MD [place], [place],  [address] [phone] Mon-Fri 8:00-5:00 Seeing newborns of current patients only. No new patients Medicaid - No, Tricare - yes  Therapist, music at [place], [place]  [place] [address]., [place],  [address] [phone] Mon-Fri 8:00-5:00 Medicaid -yes as secondary coverage only;  Tricare - yes  [place], [place]; [place], [place]; Albertina Parr, MD; Frederic Jericho, MD; Ronney Lion, MD; [place], [place]; [place], NP; Corinna Lines, MD; [place], [address]., [place], [place] [address] [phone] Mon-Fri 8:30-5:00, Sat 9:00-11:00 Accepts commercial insurance ONLY. Offers free prenatal information sessions for families. Medicaid - No, Tricare - Call first  [place], MD; [place], [place]; [place], [place]; [place], Corin[address]., [place] [address] [phone] Mon-Fri 7:30-5:30 Medicaid - Yes; Marchelle Gearing yes  [place] [phone] & [phone])  [facility], [place] [address] [place], [place] [address] [phone] Mon-Thur 8:00-6:00, closed for lunch 12-2, closed Fridays Medicaid - yes; Tricare - no  [place], NP; Melford Aase, MD; [place], [place]; [place], [address] [place], [place], [place] [address] [phone] Mon-Fri 7:30-4:30 Medicaid - yes, Tricare - yes  [facility]  Carolynn Sayers, MD; Cristino Martes, NP; Gertie Baron, MD; Debara Pickett, NP [place] [address], [place], [place] [address] [phone] Mon-Fri 8:30-5:00, closed for lunch 1-2, Sat 8:30-12:00 - sick visits only Providers come to see babies at [facility] Only accepting newborns of siblings and first time parents ONLY if who have met with office prior to delivery Medicaid -Yes; Tricare - yes  [place], [place]; Fredderick Severance, NP; Juleen China, MD; Clydene Laming, MD:  [place] [address], [place], [place] [address] [phone] Mon- Fri 8:00-5:00, Sat 9:00-12:00 - sick visits only Accepting siblings of established patients and first time mom/baby Medicaid - Yes; Tricare - yes Patients must have vaccinations (baby vaccines)  [place] [phone] &  [phone])  [place] at [place]., [employer], [place] [address] [phone] Mon-Fri 8:00-5:00 Medicaid - no; Tricare - yes  Cooper, MD; [place], [place]; Cecil-Bishop, Tequesta [address], [place], [place] [address] [phone] Mon-Fri 8:00-5:00 Medicaid- yes; Tricare - yes  [place], MD; Ronnald Ramp, NP; [place], [place] [address] [place], [place], [place] [address] [phone] Mon-Fri 8:00-5:00 Medicaid - Yes; Tricare - yes  [address] [place] [phone])  [place], [place]; [place], [place]; Maisie Fus, MD; Charlesetta Garibaldi, MD; Barcl[place], NP; Isenhour, DO; [place], [place]; Jeannine Kitten, MD; Sheila Oats, MD; Hardin Negus, MD; [place], [place]; [place], [place]; [place], [place] [address] [place], [place], [place] [address] [phone] Mon-Fri 8:30-5:00, Sat 9:00-12:00 - sick only Please register online [email or web address] then schedule online or call office Medicaid-Yes; Tricare -yes  [facility] - Premier  [place], MD; Delora Fuel, MD; [place], MD; [place], NP; [place], [place]; Everette Rank, MD; Radford Pax, NP; Melina Modena, MD [address] [place], [place], [place] [address] [phone] Mon-Fri 8:00-5:30, Sat&Sun by appointment (phones open at 8:30) Medicaid - Yes; Tricare - yes  [place] [phone] & [phone]) [place], CPNP; Ojo Amarillo, MD; Araceli Bouche, MD; Jerelene Redden, NP; [place], DO [address], [place], [place] [address] [phone] M-F 8:00 - 5:15, Sat/Sun 9-12 sick visits only Medicaid - No; Tricare - yes  [place], PA-C; [place], PA-C; [place]oret[place], DO; [place]ocah[place], PA-C; [place], PA-C; Vassie Moselle, [address]., [place],  [address] [phone] Mon-Thur 8:00-7:00, Fri 8:00-5:00 Accepting Medicaid for 13 and under only   [facility] Medicine  at [place] [facility] [place]) [place], ; List, FNP; Selinda Eon, MD; [place], PA-C; Scholer,  MD; Modena Nunnery, FNP; Everlean Patterson, FNP; Tempie Donning, MD; Selinda Eon, [place] [address], [place], [place] [address] [phone] Mon-Fri 8:30-5:30 Medicaid - Yes; Tricare - yes  Fellows, [place]; Rolla Plate, MD; Carola Rhine, MD; Tyron Russell, MD; [place], NP [address], [place], [place] [address] [phone] Mon-Thur 8:00-5:30, Fri 8:00-5:00, Sat 9:00-12:00 Medicaid - yes, Tricare - yes  [place] [phone])  [place] at [facility], [place]; Olen Pel, MD; [place], [place], [place], [place] [address] [phone] Mon-Fri 8:00-5:00, closed for lunch 12-1 Medicaid - No; [place] - yes  Therapist, music at [facility], [place], [place], [place] [address] [phone] Mon-Fri 8:00-5:00 Medicaid - No; Tricare - yes  [place] [facility] - [facility], MD; Joelene Millin, MD; Teryl Lucy, MD; Ronnald Ramp, MD [address], [place], [place] [address] [phone] Mon-Fri 8:00-5:00 Medicaid- Yes; Tricare - yes  [place] [phone])  [place] at [facility], [place]; Ringo, MD [address] [place], [place], [place] [address] [phone] Mon-Fri 8:00-5:00 Medicaid - No; [place] - yes  [place] [address] [place], [place], [place] [address] [phone] Mon-Weds 8:00-6:00, Thurs-Fri 8:00-5:00, Sat 9:00-12:00 Medicaid - yes; Tricare - yes   [place], MD; [place], [place] [address] [place], [place] [address] [phone] Mon-Fri 8:00-5:00 Medicaid - yes; [place] - yes  [facility] Pediatric Providers  [facility] [address], [place], [place] [address] [phone] Gentry Roch: 8am -8pm, Tues, Weds: 8am - 5pm; Fri: 8-1 Medicaid - Yes; Tricare -  yes  [facility] Ilda Mori, MD; Wynetta Emery, MD; Rock Nephew, MD; [place], [place]; [place], [place] [address], [place], [place] [address] [phone] M-F 8:30 - 5:00 Medicaid - Call office; Tricare -yes  [facility] Fredderick Erb, MD; Wynelle [place], MD, Cherylann Banas, MD; Koren Bound, PNP; Terrial Rhodes, [place] [address] [place], [place] [address] [phone] M-F 8:30 - 5:00, Sat/Sun 8:30 - 12:30 (sick visits) Medicaid - Call office; Tricare -yes  [facility] Bobby Rumpf, MD; Wynetta Emery, PNP; [name], MD; [place], [place]; [place], NP; Orson Aloe [address], [place], [place], [place] [address] [phone] M-F 8:30 - 5:00 Medicaid - Call office; Tricare - yes  [facility] Collene Leyden, MD; Marney Doctor, MD; Melburn Hake, MD; Franki Cabot, MD; Nogo, MD [phone] [address], [place], [place] [address] [phone] M-Thur: 8:00 - 5:00; Fri: 8:00 - 4:00 Medicaid - yes; Tricare - yes  [facility] [address] [place], [place] [address] [phone] M-F: 8:30- 5:00, closed for lunch 12:30 - 1:00 Medicaid - yes; Tricare -yes  [place] [address], [place], [place] [id number] [phone] M-F 8:00 - 5:00 Medicaid - yes; Tricare - yes  [place], DO; [place], DO; [place], [place] [address], [place], [place] [address] [phone] M-F 8:00 - 5:00, Closed 12-1 for lunch Medicaid - Call; Tricare - yes  [place], MD [address] [place], [place] [address] [phone] M-F: 8:00-5:00, Sat: 8:00 - noon Medicaid - call; [place] -yes  [facility] [place], FNP-C [address] [place], [place], [place] [address] [phone] M-W: 8:00-5:00, Thur: 8:00 - 7:00, Fri: 8:00 - noon Medicaid - yes; Tricare - yes  [place], [place], [place],  [place] [address] [phone] M-F 8:00 - 5:00, Closed for lunch 12-1 Medicaid -  yes; [place] - yes  [facility] Pediatric Providers  [facility] at [place], [place], Trilby Drummer, MD, [place], [place], [facility], [place], [place], [place] [address] [phone] M-T 8:00-5:00, Wed-Fri 7:00-6:00 Medicaid - Yes; Tricare -yes  [place] at [facility], [place]; [address], [place], [place], [place] [address] [phone] M-F 8:00 - 5:00, closed for lunch 12-1 Medicaid - Yes; Tricare - yes  [facility]  Drema Dallas, MD; Marilynn Rail, MD; Kerby Less, MD; Margaretmary Eddy, MD; Damita Dunnings, MD; Deidre Ala, MD; Arrie Eastern, MD, Leward Quan, MD; Sherren Mocha, MD; Emmit Pomfret, MD; Morton Stall, MD; Clydene Laming, MD [address], [place], [place] [address] [phone] M-F 8:00-5:00 Medicaid - yes; Tricare - yes  [facility] Gould, MD (speaks [place] and [place]) [address] [place], [place] [address] [phone] M-F: 8:30 - 5:00, closed 12:30 - 1 for lunch Medicaid - Yes; [place] -yes  [facility] Pediatric Providers  [facility] Delice Lesch, MD; Lindell Noe, MD; Lavone Neri, [place], [place], [place] [address] [phone] M-Th: 8:00 - 5:30, Fri: 8:00 - 12:00 Medicaid - yes; Tricare - yes  [facility] at [facility], NP; Verlee Monte, MD; Nadeen Landau, MD [place] [address], [place], [place] [address] [phone] M-F: 8:00 - 5:00 Medicaid - yes; Tricare - yes  [place], NP; Deon Pilling, NP; Angelica Pou, NP; Manya Silvas, MD; Jimmye Norman, MD, [place], NP, Glo Herring, MD; Cathleen Fears [address] [place], [place] [address] [phone] M-F: 8:30 - 5:30p Medicaid - yes; Tricare - yes Other locations available as well  [facility], MD; Redmond Pulling, MD; Marcine Matar, [place], [place], [place] [address] [phone] M-W: 8:00am - 7:00pm, Thurs: 8:00am - 8:00pm; Fri: 8:00am -  5:00pm, closed daily from 12-1 for lunch Medicaid - yes; [place] - yes  [facility] Pediatric Providers  [facility] at Robb Matar, MD; Donella Stade, [place]; Carrolyn Meiers, MD; Johnnette Litter, MD; [place], [place]; Leonie Man; [place], [place]; Alcario Drought, MD;  [address] [place], [place] [address] [phone] Jerilynn Mages - Ludwig Clarks: [place], Sat 9-noon Medicaid - Yes; Tricare -yes  [facility] at Smitty Knudsen, MD; Ronnald Ramp, FNP; Sherryle Lis, MD; Teryl Lucy, [place]. Sharyl Nimrod, [phone] M-F 8:00 - 5:00 Medicaid - call; Tricare - yes  [facility]- Lollie Sails, MD; [place], [place]; Lawana Chambers, MD; Ouida Sills, MD; Red Christians, MD; Marzetta Board, MD; Emiliano Dyer; Randolm Idol, MD; Derald Macleod, MD; [place], [place], [place], [place] [address] [phone] M-F 8:00am - 5:00pm; Sat. 9:00 - 11:00 Medicaid - yes; Tricare - yes  [place] [facility] at [facility], MD [address] [place], [place] [address] [phone] M-F 8:00 - 5:00 Medicaid - [place] Medicaid only; Tricare - yes  [facility] - Signa Kell, MD; Rosana Hoes, [place]; Gordan Payment, [place], [place], [place] [address] [phone] M-F 8:00 - 5:00 Medicaid - yes; Tricare - yes  [facility] - [address] Pediatrics - Francine Graven, MD; Owens Shark, MD, [facility], MD, Olivia Lopez de Gutierrez, MD; Throop, MD; Tamala Julian, MD; [address] Nyoka Lint [place], [place] [address] [phone] M-F: 8-5 Medicaid - yes; [place] - yes  Purple [place], [place]; [place], MD; [address], [place],  [address] [phone] M-F 8-5 Medicaid - yes; Tricare - yes  [address] [place] Joycelyn Rua, MD; Joelene Millin, MD;  [place]-Courture, MD; Pell[place], DNP; [place], [place], [place], [place] [address] [phone] M-F 8-5 Medicaid - yes; Tricare - yes  [facility] Danelle Earthly, MD;  Vinson Moselle; Lucie Leather, MD [address], [place], [place] [id number] [phone] M-F 7am - 5 pm Medicaid - call; Tricare - yes  [place], [place]; Louanne Skye, MD; Quentin Cornwall, [place], [place], [place] [address] [phone] M-F 8-5 Medicaid - yes; Tricare - yes  [facility] Pascal Lux, MD; Suzan Slick, MD; Jimmye Norman, [place]; Rolena Infante, [place]; Thornton Papas, [place]; Robinette Haines; [facility] - FNP [address] [place], [place] [address] [phone] M-F 8-5 Medicaid- yes; Tricare - yes  [facility] - [place] Sellers, Lively and Willa Frater, MD; Dianna Rossetti, MD; Frances Maywood, MD; Nicole Kindred, MD; [place], [place]; Marijean Bravo, MD; Louanne Skye, MD; Benjamine Mola, MD [address] [place], [place] [address] [phone] M-F: 8-5, Sat: 9-4, Sun 9-12 Medicaid - yes; Tricare - yes  [place], PNP; Rosana Hoes, [place] [address] Nyoka Lint [place], [place] [address] [phone] M-F 8 - 5, closed 12-1 for lunch Medicaid - yes; Tricare - yes  [place], MD; Enid Derry, MD; Benjamine Mola, MD; [place], [place], [place], [place] [address] [phone] M-F 8- 5:30 Medicaid - yes; Tricare - yes  Natalia, MD; Jeanell Sparrow, MD; Bartholome Bill, [place], [place], [place] [address] [phone] Jerilynn Mages: Sharyne Richters; Tues-Fri: 8-5; Sat: 9-12 Medicaid - yes; Tricare - yes  Signa Kell [facility] - Myrene Buddy, MD; Bjorn Loser, MD; Carlis Abbott, MD; Claudean Kinds, MD; Erik Obey, MD [address], [place], [place] [address] [phone] Jerilynn MagesMarland Kitchen Sharyne RichtersDarrin Luis: 8-5; Sat: 8:30-12:30 Medicaid - yes; Tricare - yes  Dareen Piano Kingston, MD; [place], [place], [place], [place] [address] [phone] Mon-Fri: 8-5 Medicaid - yes;  Tricare - yes  [facility] - [place] Beasley, CPNP; [place], [place]; Benjamine Mola, MD; Donivan Scull, MD; Noel Journey, MD; [address] [place], [place] [address] [phone] M-F: 8-5, closed 1-2 for lunch Medicaid - yes; Tricare - yes  [place], [place]; [place], [place]; Tamala Julian, MD; [place], CPNP; [place], [place]; [place], MD; Juleen China, MD [address] [place], [place], [place] [address] [phone] M-Thurs: Sharyne Richters; Fri: 8-6; Sat: 9-12; Sun 2-4 Medicaid - yes; Tricare - yes  Signa Kell [facility] [place], MD; Vernard Gambles, MD; Carol Ada, FNP; Marjorie Smolder, DO; [address], [place], [place] [address] [phone] M-F: 8-5 Medicaid - yes; [place] - yes  [facility] Pediatric Providers  [place], MD; Pad[place], [place], [place], [place] [address] [phone] M - Fri: 8am - 5pm, closed for lunch 12-1 Medicaid - Yes; Tricare - yes  [facility] [place], MD; Ovid Curd, MD; [place]anger, DO; Vinocur, MD;Hall, PA; Volanda Napoleon, [place]; Megan Salon, NP [phone] [address], [place], [place] [address] [phone] M-F 8:00 - 5:00, Sat 8:00 - 11:30 Medicaid - yes; Tricare - yes  [facility] Humphrey Rolls, MD; [place], MD, [address] MD, [place], MD, East [place], MD; [place], NP; [place], [place];  [address], [place], [place] [address] [phone] M-F 8:10am - 5:00pm Medicaid - yes; Tricare - yes  [place], MD; Joseph, NP [address], [place], [place] [address] [phone] M-F 8:00 - 5:00 Medicaid - [place] Medicaid only; Tricare - yes  [place], [place]; [place], NP [place], [place], [place] [address] [phone] M-F 8:00 - 5:00; Closed for lunch 12 - 1:00 Medicaid - yes;  Tricare - yes  [place], MD; Darius Bump, [place], [place], [place] [address] [phone] Mon 9-5; Tues/Wed 10-5; Thurs 8:30-5; Fri: 8-12:30 Medicaid - yes; Tricare - yes  [facility] Pediatric Providers  [facility]  [place], MD; [place], [place] [address], [place], [place] [address] [phone] phone [phone] fax M-F 7:15 - 4:30 Medicaid - yes; Tricare - yes  [facility] Anastasio Champion, MD; [place], DO [address] [place], [place] [address] [phone] M-Fri: 8:30 - 5:00, closed for lunch everyday noon - 1pm Medicaid - Yes; Tricare - yes  [facility] Burdine, MD; Quillian Quince, MD; Nadara Mustard, MD; Quintin Alto, MD; [place], [place]; Antony Blackbird, [place]; [place], [place]; [place], [place]; [place], Titanic [address]. [place], [place] [address] [phone] M-Thurs: 7:30am - 7:00pm; Friday 7:30am - 4pm; Sat: 8:00 - 1:00 Medicaid - Yes; Tricare - yes  [place] [facility] Freida Busman, MD; Lanny Cramp, MD; Janit Bern, MD; [place], [place]. [address], [place], [place], [place] [address] [phone] M-Thur: 8:00 - 5:00, Fri: 8:00 - Noon Medicaid - yes; Tricare - yes No [place] Zalma Dettinger, MD; Lajuana Ripple, DO; [place], NP; Hassell Done, NP; Lilia Pro, NP; Jeneen Montgomery, NP; Thayer Ohm, NP; Livia Snellen, MD; [place], [employer] [address], [place], [place] [address] [phone] M-F 8:00 - 5:00 Medicaid - yes; Tricare - yes  Buckland Collins, FNP-C; [place], FNP-C [address] Slicker, [place] [address] [phone] M, W, R 8:00-5:00, Tues: 8:00am - 7:00pm; Fri 8:00 - noon Medicaid - Yes; Tricare - yes  [facility], MD Wilson [place], [place] [address] [phone]  M-Thurs 8:30-5:30, Fri: 8:30-12:30pm Medicaid - Yes; Tricare - N

## 2022-03-24 NOTE — Progress Notes (Signed)
   NEW GYNECOLOGY VISIT  Subjective:  Catherine Simmons is a 37 y.o. [id number] with LMP 01/20/22 presenting for AUB  Longstanding history of regular, heavy periods lasting 3-5 days. Changes pad q1-2h. Is currently having essentially daily bleeding for 2 months. Just switched from [facility] to Depo on 03/13/22.   No longer desires fertility.  Reviewed note by Silvio Pate PA-C on 03/13/22 Reviewed labs from 03/13/22: CBC w/ Hgb 12.3, plt 226; POCT UPT negative, CD4 count 48 Reviewed Pap 12/26/19 NILM/HPV negative  PMH: HIV, HTN, hx PID, depression, HS PSH: D&C, chole, shoulder, dental Meds: biktarvy, norvasc, HCTZ All: stadol OB: SVDx5 Pap Hx:  12/26/19 NILM/HPV negative 05/12/16 NILM/negative 06/16/13 NILM Soc: +tobacco, trying to quit.   Objective:   Vitals:   03/24/22 1425 03/24/22 1440  BP: (!) 134/94 (!) 155/107  Pulse: 80 82  Weight: 215 lb (97.5 kg)   Height: 5\' 2"  (1.575 m)     General:  Alert, oriented and cooperative. Patient is in no acute distress.  Skin: Skin is warm and dry. No rash noted.   Cardiovascular: Normal heart rate noted  Respiratory: Normal respiratory effort, no problems with respiration noted  Abdomen: Soft, gravid, appropriate for gestational age.        Pelvic: Deferred. Discussed need for exam if bleeding does not respond to hormone therapy and due for pap 12/2023.   Assessment and Plan:  Catherine Simmons is a 37 y.o. with AUB  1. Abnormal uterine bleeding (AUB) We reviewed a focused differential diagnosis for AUB that includes, but is not limited to anovulatory cycles (menopausal transition/POI, PCOS, hypothalamic hypogonadism), endometrial/endocervical polyps, fibroids, and thyroid disease.  CBC TSH Pelvic ultrasound We reviewed management options including progestins (continuous & cyclic), DMPA (Depo Provera), LNG-IUD (Mirena), and Lysteda (TXA). Discussed role for hysteroscopic myomectomy/polypectomy, ablation and hysterectomy as  needed. Discussed that Depo Provera can take 6-12 months to achieve favorable bleeding profile. Plan for aygestin 10mg  daily x 30d as bridge while getting labs/US.  Follow up in 1 month for results review and further discussion of management options.  Diagnoses and all orders for this visit: Abnormal uterine bleeding (AUB) -     CBC -     TSH -     US PELVIC COMPLETE WITH TRANSVAGINAL; Future -     norethindrone (AYGESTIN) 5 MG tablet; Take 2 tablets (10 mg total) by mouth daily.  Return in about 4 weeks (around 04/21/2022) for gyn follow up - results review.  Future Appointments  Date Time Provider [place]  06/11/2022  8:45 AM [place] Callas, NP [facility]    Inez Catalina, MD

## 2022-03-24 NOTE — Progress Notes (Signed)
New pt to office with AUB.  Pt states she has been bleeding x 2 months - heavy with pain. Pt states she will have a few days break from bleeding and then starts again.   Pt states she was given Depo approx 1 week ago. See note from 03/13/22.

## 2022-03-25 ENCOUNTER — Other Ambulatory Visit: Payer: Self-pay

## 2022-03-25 LAB — CBC
Hematocrit: 40.1 % (ref 34.0–46.6)
Hemoglobin: 12.8 g/dL (ref 11.1–15.9)
MCH: 27.2 pg (ref 26.6–33.0)
MCHC: 31.9 g/dL (ref 31.5–35.7)
MCV: 85 fL (ref 79–97)
Platelets: 243 10*3/uL (ref 150–450)
RBC: 4.7 x10E6/uL (ref 3.77–5.28)
RDW: 13.1 % (ref 11.7–15.4)
WBC: 3.9 10*3/uL (ref 3.4–10.8)

## 2022-03-25 LAB — TSH: TSH: 0.754 u[IU]/mL (ref 0.450–4.500)

## 2022-04-03 ENCOUNTER — Ambulatory Visit (HOSPITAL_COMMUNITY): Admission: RE | Admit: 2022-04-03 | Payer: Medicaid Other | Source: Ambulatory Visit

## 2022-04-06 ENCOUNTER — Ambulatory Visit (HOSPITAL_COMMUNITY)
Admission: RE | Admit: 2022-04-06 | Discharge: 2022-04-06 | Disposition: A | Discharge status: Home / Self Care | Payer: Medicaid Other | Source: Ambulatory Visit | Attending: Obstetrics and Gynecology | Admitting: Obstetrics and Gynecology

## 2022-04-06 DIAGNOSIS — N939 Abnormal uterine and vaginal bleeding, unspecified: Secondary | ICD-10-CM | POA: Insufficient documentation

## 2022-04-07 ENCOUNTER — Encounter: Payer: Self-pay | Admitting: Obstetrics and Gynecology

## 2022-04-21 ENCOUNTER — Other Ambulatory Visit (HOSPITAL_COMMUNITY)
Admission: RE | Admit: 2022-04-21 | Discharge: 2022-04-21 | Disposition: A | Discharge status: Home / Self Care | Payer: Medicaid Other | Source: Ambulatory Visit | Attending: Obstetrics and Gynecology | Admitting: Obstetrics and Gynecology

## 2022-04-21 ENCOUNTER — Other Ambulatory Visit (HOSPITAL_COMMUNITY): Payer: Self-pay

## 2022-04-21 ENCOUNTER — Ambulatory Visit (INDEPENDENT_AMBULATORY_CARE_PROVIDER_SITE_OTHER): Payer: Medicaid Other | Admitting: Obstetrics and Gynecology

## 2022-04-21 ENCOUNTER — Encounter: Payer: Self-pay | Admitting: Obstetrics and Gynecology

## 2022-04-21 VITALS — BP 141/101 | HR 79 | Wt 226.1 lb

## 2022-04-21 DIAGNOSIS — N939 Abnormal uterine and vaginal bleeding, unspecified: Secondary | ICD-10-CM

## 2022-04-21 DIAGNOSIS — Z113 Encounter for screening for infections with a predominantly sexual mode of transmission: Secondary | ICD-10-CM | POA: Insufficient documentation

## 2022-04-21 MED ORDER — NORETHINDRONE ACETATE 5 MG PO TABS: 15.0000 mg | ORAL_TABLET | Freq: Every day | ORAL | 2 refills | Status: DC

## 2022-04-21 NOTE — H&P (View-Only) (Signed)
 {  new or return:[id number]} GYNECOLOGY VISIT  Subjective:  Catherine Simmons is a 37 y.o. [id number] with LMP *** presenting for ***   Menstrual Hx:  PMH: PSH: Meds: All: OB: Pap Hx:  Soc: FHx:  Objective:   Vitals:   04/21/22 1123  BP: (!) 142/102  Pulse: 75  Weight: 226 lb 1.6 oz (102.6 kg)    General:  Alert, oriented and cooperative. Patient is in no acute distress.  Skin: Skin is warm and dry. No rash noted.   Cardiovascular: Normal heart rate noted  Respiratory: Normal respiratory effort, no problems with respiration noted  Abdomen: Soft, non-tender, non-distended   Pelvic: NEFG.   Exam performed in the presence of a chaperone  Assessment and Plan:  Catherine Simmons is a 37 y.o. with ***  1. Routine screening for STI (sexually transmitted infection) *** - Cervicovaginal ancillary only( [place])   No follow-ups on file.  Future Appointments  Date Time Provider Department Center  06/11/2022  8:45 AM Dixon, Stephanie N, NP [facility]    Della Homan C Hazelee Harbold, MD  

## 2022-04-21 NOTE — Progress Notes (Unsigned)
 {  new or return:[id number]} GYNECOLOGY VISIT  Subjective:  Catherine Simmons is a 37 y.o. [phone] with LMP *** presenting for ***   Menstrual Hx:  PMH: PSH: Meds: All: OB: Pap Hx:  Soc: FHx:  Objective:   Vitals:   04/21/22 1123  BP: (!) 142/102  Pulse: 75  Weight: 226 lb 1.6 oz (102.6 kg)    General:  Alert, oriented and cooperative. Patient is in no acute distress.  Skin: Skin is warm and dry. No rash noted.   Cardiovascular: Normal heart rate noted  Respiratory: Normal respiratory effort, no problems with respiration noted  Abdomen: Soft, non-tender, non-distended   Pelvic: NEFG.   Exam performed in the presence of a chaperone  Assessment and Plan:  Catherine Simmons is a 37 y.o. with ***  1. Routine screening for STI (sexually transmitted infection) *** - Cervicovaginal ancillary only( [place])   No follow-ups on file.  Future Appointments  Date Time Provider Department Center  06/11/2022  8:45 AM Blanchard Kelch, NP [facility]    Lennart Pall, MD

## 2022-04-21 NOTE — Patient Instructions (Addendum)
We are planning to do a hysteroscopy, D&C and put in a Mirena IUD This will let us look for polyps, rule out a cancer or precancer AND make your periods lighter and shorter.  Keep taking the aygestin in the meantime to help with your bleeding. You have two refills if you need them before your surgery.  Our surgery team will call you with a surgery date in the next 1-2 weeks

## 2022-04-21 NOTE — Progress Notes (Unsigned)
Pt presents to follow up AUB. She is still experiencing heavy bleeding, changing pads every 2 hrs, and "tennis ball" size blood clots.  She requests rf on Aygestin.

## 2022-04-22 ENCOUNTER — Other Ambulatory Visit (HOSPITAL_COMMUNITY): Payer: Self-pay

## 2022-04-22 LAB — CERVICOVAGINAL ANCILLARY ONLY
Chlamydia: NEGATIVE
Comment: NEGATIVE
Comment: NEGATIVE
Comment: NORMAL
Neisseria Gonorrhea: NEGATIVE
Trichomonas: NEGATIVE

## 2022-04-23 NOTE — Addendum Note (Signed)
Addended by: Gale Journey on: 04/23/2022 06:27 PM   Modules accepted: Orders

## 2022-05-04 ENCOUNTER — Other Ambulatory Visit: Payer: Self-pay

## 2022-05-04 ENCOUNTER — Encounter (HOSPITAL_BASED_OUTPATIENT_CLINIC_OR_DEPARTMENT_OTHER): Payer: Self-pay | Admitting: Obstetrics and Gynecology

## 2022-05-04 NOTE — Progress Notes (Signed)
Spoke w/ via phone for pre-op interview: patient Lab needs dos: EKG, BMP, T&S, UPT Lab results: NA COVID test: patient states asymptomatic no test needed. Arrive at 1315 05/06/22 NPO after MN except clear liquids.Clear liquids from MN until 1215pm. Med rec completed. Medications to take morning of surgery: Aygestin and Biktarvy Diabetic medication: NA Patient instructed no nail polish to be worn day of surgery. Patient instructed to bring photo id and insurance card day of surgery. Patient aware to have Driver (ride ) / caregiver for 24 hours after surgery.  Patient Special Instructions: NA Pre-Op special Istructions: NA Patient verbalized understanding of instructions that were given at this phone interview. Patient denies shortness of breath, chest pain, fever, cough at this phone interview.

## 2022-05-06 ENCOUNTER — Other Ambulatory Visit: Payer: Self-pay

## 2022-05-06 ENCOUNTER — Ambulatory Visit (HOSPITAL_BASED_OUTPATIENT_CLINIC_OR_DEPARTMENT_OTHER): Payer: Medicaid Other | Admitting: Anesthesiology

## 2022-05-06 ENCOUNTER — Encounter (HOSPITAL_BASED_OUTPATIENT_CLINIC_OR_DEPARTMENT_OTHER): Payer: Self-pay | Admitting: Obstetrics and Gynecology

## 2022-05-06 ENCOUNTER — Encounter (HOSPITAL_BASED_OUTPATIENT_CLINIC_OR_DEPARTMENT_OTHER)
Admission: RE | Disposition: A | Discharge status: Home / Self Care | Payer: Self-pay | Source: Ambulatory Visit | Attending: Obstetrics and Gynecology

## 2022-05-06 ENCOUNTER — Ambulatory Visit (HOSPITAL_BASED_OUTPATIENT_CLINIC_OR_DEPARTMENT_OTHER)
Admission: RE | Admit: 2022-05-06 | Discharge: 2022-05-06 | Disposition: A | Discharge status: Home / Self Care | Payer: Medicaid Other | Source: Ambulatory Visit | Attending: Obstetrics and Gynecology | Admitting: Obstetrics and Gynecology

## 2022-05-06 DIAGNOSIS — F32A Depression, unspecified: Secondary | ICD-10-CM | POA: Diagnosis not present

## 2022-05-06 DIAGNOSIS — N939 Abnormal uterine and vaginal bleeding, unspecified: Secondary | ICD-10-CM

## 2022-05-06 DIAGNOSIS — Z79899 Other long term (current) drug therapy: Secondary | ICD-10-CM | POA: Diagnosis not present

## 2022-05-06 DIAGNOSIS — Z30430 Encounter for insertion of intrauterine contraceptive device: Secondary | ICD-10-CM

## 2022-05-06 DIAGNOSIS — Z87891 Personal history of nicotine dependence: Secondary | ICD-10-CM | POA: Diagnosis not present

## 2022-05-06 DIAGNOSIS — Z6841 Body Mass Index (BMI) 40.0 and over, adult: Secondary | ICD-10-CM | POA: Insufficient documentation

## 2022-05-06 DIAGNOSIS — F1290 Cannabis use, unspecified, uncomplicated: Secondary | ICD-10-CM | POA: Diagnosis not present

## 2022-05-06 DIAGNOSIS — F419 Anxiety disorder, unspecified: Secondary | ICD-10-CM | POA: Diagnosis not present

## 2022-05-06 DIAGNOSIS — I10 Essential (primary) hypertension: Secondary | ICD-10-CM

## 2022-05-06 DIAGNOSIS — F418 Other specified anxiety disorders: Secondary | ICD-10-CM | POA: Diagnosis not present

## 2022-05-06 DIAGNOSIS — Z21 Asymptomatic human immunodeficiency virus [HIV] infection status: Secondary | ICD-10-CM | POA: Diagnosis not present

## 2022-05-06 DIAGNOSIS — B20 Human immunodeficiency virus [HIV] disease: Secondary | ICD-10-CM | POA: Insufficient documentation

## 2022-05-06 DIAGNOSIS — Z01818 Encounter for other preprocedural examination: Secondary | ICD-10-CM

## 2022-05-06 HISTORY — PX: HYSTEROSCOPY WITH D & C: SHX1775

## 2022-05-06 HISTORY — PX: INTRAUTERINE DEVICE (IUD) INSERTION: SHX5877

## 2022-05-06 LAB — TYPE AND SCREEN
ABO/RH(D): A POS
Antibody Screen: NEGATIVE

## 2022-05-06 LAB — BASIC METABOLIC PANEL
Anion gap: 7 (ref 5–15)
BUN: 12 mg/dL (ref 6–20)
CO2: 23 mmol/L (ref 22–32)
Calcium: 8.9 mg/dL (ref 8.9–10.3)
Chloride: 107 mmol/L (ref 98–111)
Creatinine, Ser: 0.6 mg/dL (ref 0.44–1.00)
GFR, Estimated: >60 mL/min (ref >60)
Glucose, Bld: 94 mg/dL (ref 70–99)
Potassium: 3.6 mmol/L (ref 3.5–5.1)
Sodium: 137 mmol/L (ref 135–145)

## 2022-05-06 LAB — POCT PREGNANCY, URINE: Preg Test, Ur: NEGATIVE

## 2022-05-06 SURGERY — DILATATION AND CURETTAGE /HYSTEROSCOPY
Anesthesia: General | Site: Uterus

## 2022-05-06 MED ORDER — AMLODIPINE BESYLATE 10 MG PO TABS
10.0000 mg | ORAL_TABLET | Freq: Every day | ORAL | Status: DC
  Administered 2022-05-06: 10 mg via ORAL
  Filled 2022-05-06: qty 1

## 2022-05-06 MED ORDER — ACETAMINOPHEN 500 MG PO TABS
ORAL_TABLET | ORAL | Status: AC
  Filled 2022-05-06: qty 2

## 2022-05-06 MED ORDER — CELECOXIB 200 MG PO CAPS
200.0000 mg | ORAL_CAPSULE | Freq: Once | ORAL | Status: AC
  Administered 2022-05-06: 200 mg via ORAL

## 2022-05-06 MED ORDER — FENTANYL CITRATE (PF) 100 MCG/2ML IJ SOLN
25.0000 ug | INTRAMUSCULAR | Status: DC | PRN
  Administered 2022-05-06: 50 ug via INTRAVENOUS

## 2022-05-06 MED ORDER — SODIUM CHLORIDE 0.9 % IR SOLN
Status: DC | PRN
  Administered 2022-05-06: 1000 mL

## 2022-05-06 MED ORDER — LIDOCAINE HCL 1 % IJ SOLN
INTRAMUSCULAR | Status: AC
  Filled 2022-05-06: qty 20

## 2022-05-06 MED ORDER — PROMETHAZINE HCL 25 MG/ML IJ SOLN: 6.2500 mg | INTRAMUSCULAR | Status: DC | PRN

## 2022-05-06 MED ORDER — LIDOCAINE HCL (PF) 1 % IJ SOLN
INTRAMUSCULAR | Status: DC | PRN
  Administered 2022-05-06: 10 mL

## 2022-05-06 MED ORDER — ACETAMINOPHEN 500 MG PO TABS
1000.0000 mg | ORAL_TABLET | Freq: Once | ORAL | Status: AC
  Administered 2022-05-06: 1000 mg via ORAL

## 2022-05-06 MED ORDER — PROPOFOL 10 MG/ML IV BOLUS
INTRAVENOUS | Status: AC
  Filled 2022-05-06: qty 20

## 2022-05-06 MED ORDER — CELECOXIB 200 MG PO CAPS
ORAL_CAPSULE | ORAL | Status: AC
  Filled 2022-05-06: qty 1

## 2022-05-06 MED ORDER — LEVONORGESTREL 20 MCG/DAY IU IUD
1.0000 | INTRAUTERINE_SYSTEM | INTRAUTERINE | Status: AC
  Administered 2022-05-06: 1 via INTRAUTERINE

## 2022-05-06 MED ORDER — FENTANYL CITRATE (PF) 100 MCG/2ML IJ SOLN
INTRAMUSCULAR | Status: AC
  Filled 2022-05-06: qty 2

## 2022-05-06 MED ORDER — OXYCODONE HCL 5 MG PO TABS
5.0000 mg | ORAL_TABLET | Freq: Once | ORAL | Status: AC | PRN
  Administered 2022-05-06: 5 mg via ORAL

## 2022-05-06 MED ORDER — OXYCODONE HCL 5 MG PO TABS
ORAL_TABLET | ORAL | Status: AC
  Filled 2022-05-06: qty 1

## 2022-05-06 MED ORDER — LIDOCAINE 2% (20 MG/ML) 5 ML SYRINGE
INTRAMUSCULAR | Status: DC | PRN
  Administered 2022-05-06: 100 mg via INTRAVENOUS

## 2022-05-06 MED ORDER — LEVONORGESTREL 20 MCG/DAY IU IUD
INTRAUTERINE_SYSTEM | INTRAUTERINE | Status: AC
  Filled 2022-05-06: qty 1

## 2022-05-06 MED ORDER — ONDANSETRON HCL 4 MG/2ML IJ SOLN
INTRAMUSCULAR | Status: AC
  Filled 2022-05-06: qty 2

## 2022-05-06 MED ORDER — LACTATED RINGERS IV SOLN: INTRAVENOUS | Status: DC

## 2022-05-06 MED ORDER — ONDANSETRON HCL 4 MG/2ML IJ SOLN
INTRAMUSCULAR | Status: DC | PRN
  Administered 2022-05-06: 4 mg via INTRAVENOUS

## 2022-05-06 MED ORDER — DEXAMETHASONE SODIUM PHOSPHATE 10 MG/ML IJ SOLN
INTRAMUSCULAR | Status: DC | PRN
  Administered 2022-05-06: 10 mg via INTRAVENOUS

## 2022-05-06 MED ORDER — MIDAZOLAM HCL 2 MG/2ML IJ SOLN
INTRAMUSCULAR | Status: AC
  Filled 2022-05-06: qty 2

## 2022-05-06 MED ORDER — DEXAMETHASONE SODIUM PHOSPHATE 10 MG/ML IJ SOLN
INTRAMUSCULAR | Status: AC
  Filled 2022-05-06: qty 1

## 2022-05-06 MED ORDER — MIDAZOLAM HCL 5 MG/5ML IJ SOLN
INTRAMUSCULAR | Status: DC | PRN
  Administered 2022-05-06: 2 mg via INTRAVENOUS

## 2022-05-06 MED ORDER — LIDOCAINE HCL (PF) 2 % IJ SOLN
INTRAMUSCULAR | Status: AC
  Filled 2022-05-06: qty 5

## 2022-05-06 MED ORDER — FENTANYL CITRATE (PF) 100 MCG/2ML IJ SOLN
INTRAMUSCULAR | Status: DC | PRN
  Administered 2022-05-06 (×2): 50 ug via INTRAVENOUS

## 2022-05-06 MED ORDER — PROPOFOL 10 MG/ML IV BOLUS
INTRAVENOUS | Status: DC | PRN
  Administered 2022-05-06 (×2): 50 mg via INTRAVENOUS
  Administered 2022-05-06: 200 mg via INTRAVENOUS

## 2022-05-06 MED ORDER — OXYCODONE HCL 5 MG/5ML PO SOLN: 5.0000 mg | Freq: Once | ORAL | Status: AC | PRN

## 2022-05-06 SURGICAL SUPPLY — 19 items
CATH ROBINSON RED A/P 16FR (CATHETERS) ×2 IMPLANT
CURETTE PIPELLE ENDOMTRL SUCTN (MISCELLANEOUS) IMPLANT
DEVICE MYOSURE LITE (MISCELLANEOUS) IMPLANT
DEVICE MYOSURE REACH (MISCELLANEOUS) IMPLANT
DILATOR CANAL MILEX (MISCELLANEOUS) IMPLANT
GLOVE BIO SURGEON STRL SZ7 (GLOVE) ×2 IMPLANT
GLOVE BIOGEL PI IND STRL 7.0 (GLOVE) ×2 IMPLANT
GOWN STRL REUS W/TWL LRG LVL3 (GOWN DISPOSABLE) ×4 IMPLANT
KIT PROCEDURE FLUENT (KITS) ×2 IMPLANT
KIT TURNOVER CYSTO (KITS) ×2 IMPLANT
MYOSURE XL FIBROID (MISCELLANEOUS)
Mirena Levonorgestrel-releasing intrauterine syste IMPLANT
PACK VAGINAL MINOR WOMEN LF (CUSTOM PROCEDURE TRAY) ×2 IMPLANT
PAD OB MATERNITY 4.3X12.25 (PERSONAL CARE ITEMS) ×2 IMPLANT
PIPELLE ENDOMETRIAL SUCTION CU (MISCELLANEOUS) ×2
SEAL ROD LENS SCOPE MYOSURE (ABLATOR) ×2 IMPLANT
SLEEVE SCD COMPRESS KNEE MED (STOCKING) ×2 IMPLANT
SYSTEM TISS REMOVAL MYOSURE XL (MISCELLANEOUS) IMPLANT
TOWEL OR 17X24 6PK STRL BLUE (TOWEL DISPOSABLE) ×2 IMPLANT

## 2022-05-06 NOTE — Interval H&P Note (Signed)
History and Physical Interval Note:  05/06/2022 3:17 PM  Catherine Simmons  has presented today for surgery, with the diagnosis of AUB Polyp.  The various methods of treatment have been discussed with the patient and family. After consideration of risks, benefits and other options for treatment, the patient has consented to  Procedure(s): DILATATION AND CURETTAGE /HYSTEROSCOPY (N/A) INTRAUTERINE DEVICE (IUD) INSERTION (N/A) as a surgical intervention.  The patient's history has been reviewed, patient examined, no change in status, stable for surgery.  I have reviewed the patient's chart and labs.  Questions were answered to the patient's satisfaction.     Catherine Simmons

## 2022-05-06 NOTE — Transfer of Care (Signed)
Immediate Anesthesia Transfer of Care Note  Patient: Catherine Simmons  Procedure(s) Performed: DILATATION AND CURETTAGE /HYSTEROSCOPY (Uterus) INTRAUTERINE DEVICE (IUD) INSERTION  Patient Location: PACU  Anesthesia Type:General  Level of Consciousness: drowsy  Airway & Oxygen Therapy: Patient Spontanous Breathing and Patient connected to nasal cannula oxygen  Post-op Assessment: Report given to RN and Post -op Vital signs reviewed and stable  Post vital signs: Reviewed  Last Vitals:  Vitals Value Taken Time  BP 156/100   Temp    Pulse 71 05/06/22 1618  Resp 20 05/06/22 1618  SpO2 100 % 05/06/22 1618  Vitals shown include unvalidated device data.  Last Pain:  Vitals:   05/06/22 1320  TempSrc: Oral  PainSc: 7       Patients Stated Pain Goal: 7 ([phone]  Complications: No notable events documented.

## 2022-05-06 NOTE — Anesthesia Preprocedure Evaluation (Addendum)
Anesthesia Evaluation  Patient identified by MRN, date of birth, ID band Patient awake    Reviewed: Allergy & Precautions, NPO status , Patient's Chart, lab work & pertinent test results  History of Anesthesia Complications Negative for: history of anesthetic complications  Airway Mallampati: II  TM Distance: >3 FB Neck ROM: Full    Dental  (+) Dental Advisory Given   Pulmonary Current Smoker and Patient abstained from smoking.   Pulmonary exam normal        Cardiovascular hypertension, Pt. on medications Normal cardiovascular exam     Neuro/Psych  Headaches PSYCHIATRIC DISORDERS Anxiety Depression       GI/Hepatic negative GI ROS,,,(+)     substance abuse  marijuana use  Endo/Other    Morbid obesity  Renal/GU negative Renal ROS     Musculoskeletal negative musculoskeletal ROS (+)    Abdominal  (+) + obese  Peds  Hematology  (+) Blood dyscrasia, Sickle cell trait , HIV  Anesthesia Other Findings   Reproductive/Obstetrics  Hx PIH PID                              Anesthesia Physical Anesthesia Plan  ASA: 3  Anesthesia Plan: General   Post-op Pain Management: Tylenol PO (pre-op)* and Celebrex PO (pre-op)*   Induction: Intravenous  PONV Risk Score and Plan: 2 and Treatment may vary due to age or medical condition, Ondansetron, Dexamethasone and Midazolam  Airway Management Planned: LMA  Additional Equipment: None  Intra-op Plan:   Post-operative Plan: Extubation in OR  Informed Consent: I have reviewed the patients History and Physical, chart, labs and discussed the procedure including the risks, benefits and alternatives for the proposed anesthesia with the patient or authorized representative who has indicated his/her understanding and acceptance.     Dental advisory given  Plan Discussed with: CRNA and Anesthesiologist  Anesthesia Plan Comments:         Anesthesia Quick Evaluation

## 2022-05-06 NOTE — Anesthesia Postprocedure Evaluation (Signed)
Anesthesia Post Note  Patient: Catherine Simmons  Procedure(s) Performed: DILATATION AND CURETTAGE /HYSTEROSCOPY (Uterus) INTRAUTERINE DEVICE (IUD) INSERTION     Patient location during evaluation: Phase II Anesthesia Type: General Level of consciousness: awake and alert, patient cooperative and oriented Pain management: pain level controlled Vital Signs Assessment: post-procedure vital signs reviewed and stable Respiratory status: spontaneous breathing, nonlabored ventilation and respiratory function stable Cardiovascular status: stable and blood pressure returned to baseline Postop Assessment: able to ambulate and no apparent nausea or vomiting Anesthetic complications: no   No notable events documented.  Last Vitals:  Vitals:   05/06/22 1658 05/06/22 1702  BP:  (!) 139/93  Pulse: (!) 55 (!) 53  Resp: 19 (!) 21  Temp:  36.7 C  SpO2: 100% 100%    Last Pain:  Vitals:   05/06/22 1320  TempSrc: Oral  PainSc: 7                  Luka Reisch,E. Nieko Clarin

## 2022-05-06 NOTE — Anesthesia Procedure Notes (Signed)
Procedure Name: LMA Insertion Date/Time: 05/06/2022 3:27 PM  Performed by: Rogers Blocker, CRNAPre-anesthesia Checklist: Patient identified, Emergency Drugs available, Suction available and Patient being monitored Patient Re-evaluated:Patient Re-evaluated prior to induction Oxygen Delivery Method: Circle System Utilized Preoxygenation: Pre-oxygenation with 100% oxygen Induction Type: IV induction Ventilation: Mask ventilation without difficulty LMA: LMA inserted LMA Size: 4.0 Number of attempts: 1 Airway Equipment and Method: Bite block Placement Confirmation: positive ETCO2 Tube secured with: Tape Dental Injury: Teeth and Oropharynx as per pre-operative assessment

## 2022-05-06 NOTE — Discharge Instructions (Signed)
  Post Anesthesia Home Care Instructions  Activity: Get plenty of rest for the remainder of the day. A responsible adult should stay with you for 24 hours following the procedure.  For the next 24 hours, DO NOT: -Drive a car -Paediatric nurse -Drink alcoholic beverages -Take any medication unless instructed by your physician -Make any legal decisions or sign important papers.  Meals: Start with liquid foods such as gelatin or soup. Progress to regular foods as tolerated. Avoid greasy, spicy, heavy foods. If nausea and/or vomiting occur, drink only clear liquids until the nausea and/or vomiting subsides. Call your physician if vomiting continues.  Special Instructions/Symptoms: Your throat may feel dry or sore from the anesthesia or the breathing tube placed in your throat during surgery. If this causes discomfort, gargle with warm salt water. The discomfort should disappear within 24 hours.  I

## 2022-05-06 NOTE — Op Note (Signed)
05/06/22 4:09 PM  Preoperative Diagnosis: Abnormal uterine bleeding Postoperative Diagnosis: Same Procedure: Hysteroscopy D&C, insertion of Mirena IUD  Surgeon: Dr. Gale Journey Assistant: None EBL 5 cc IVF: 700 cc  UOP: not collected Deficit: 180cc  Specimens: endometrial curetting  Findings: Redundancy of vaginal tissue initially made it challenging to visualize the cervix, but once long weight speculum & Deaver retractors were placed, patient had a normal appearing cervix with moderate descensus. Uterus mobile, sounded to 10cm. Hysteroscopic exam unremarkable with normal appearing endometrium, no polyp or fibroids.  Description of the procedure: Preop antibiotics not indicated. Informed consent reviewed and signed. Pt given opportunity to ask questions.   Pt prepped and draped in the dorsal lithotomy fashion after LMA anesthesia found to be adequate. Timeout performed.   Open sided speculum placed into the patient's vagina. As explained above, difficult to visualize cervix but after placing a long weight speculum and Deaver retractor anteriorly, was able to identify the cervix and apply a single tooth tenaculum.  Retractors replaced with the open sided speculum. Cervix progressively dilated to 19 [place]. Hysteroscope was inserted into the cavity and normal endometrium noted. Uterus sounded to 10cm. EMC performed and then endometrial biopsy pipelle was passed to collect any additional tissue. The Mirena IUD was then inserted into the uterus per manufacturer's instructions. IUD strings trimmed to 3cm. Hemostatic at the end of the procedure. Procedure completed. All instruments removed. Counts correct x2.  Pt taken to recovery room in stable condition.  Gale Journey, MD [place], [facility] for [employer], [place]

## 2022-05-07 ENCOUNTER — Encounter (HOSPITAL_BASED_OUTPATIENT_CLINIC_OR_DEPARTMENT_OTHER): Payer: Self-pay | Admitting: Obstetrics and Gynecology

## 2022-05-08 LAB — SURGICAL PATHOLOGY

## 2022-05-12 ENCOUNTER — Other Ambulatory Visit: Payer: Self-pay | Admitting: Advanced Practice Midwife

## 2022-05-12 ENCOUNTER — Encounter: Payer: Self-pay | Admitting: Obstetrics

## 2022-05-14 ENCOUNTER — Other Ambulatory Visit (HOSPITAL_COMMUNITY): Payer: Self-pay

## 2022-05-18 ENCOUNTER — Other Ambulatory Visit (HOSPITAL_COMMUNITY): Payer: Self-pay

## 2022-05-29 ENCOUNTER — Other Ambulatory Visit: Payer: Self-pay

## 2022-05-29 ENCOUNTER — Other Ambulatory Visit (HOSPITAL_COMMUNITY): Payer: Self-pay

## 2022-06-11 ENCOUNTER — Ambulatory Visit (INDEPENDENT_AMBULATORY_CARE_PROVIDER_SITE_OTHER): Payer: Medicaid Other | Admitting: Infectious Diseases

## 2022-06-11 ENCOUNTER — Encounter: Payer: Self-pay | Admitting: Infectious Diseases

## 2022-06-11 ENCOUNTER — Other Ambulatory Visit: Payer: Self-pay

## 2022-06-11 ENCOUNTER — Encounter: Payer: Self-pay | Admitting: Obstetrics and Gynecology

## 2022-06-11 VITALS — BP 136/86 | HR 69 | Temp 97.7°F | Ht 62.0 in | Wt 220.0 lb

## 2022-06-11 DIAGNOSIS — N939 Abnormal uterine and vaginal bleeding, unspecified: Secondary | ICD-10-CM | POA: Diagnosis not present

## 2022-06-11 DIAGNOSIS — Z23 Encounter for immunization: Secondary | ICD-10-CM | POA: Diagnosis not present

## 2022-06-11 DIAGNOSIS — B20 Human immunodeficiency virus [HIV] disease: Secondary | ICD-10-CM

## 2022-06-11 DIAGNOSIS — S025XXD Fracture of tooth (traumatic), subsequent encounter for fracture with routine healing: Secondary | ICD-10-CM | POA: Diagnosis not present

## 2022-06-11 DIAGNOSIS — S025XXA Fracture of tooth (traumatic), initial encounter for closed fracture: Secondary | ICD-10-CM | POA: Insufficient documentation

## 2022-06-11 NOTE — Patient Instructions (Signed)
Send Dr. Berton Lan a message to help her understand the bleeding you have noticed. It may be something to be expected with the IUD in place, but if you can't feel your strings and it feels like it may have moved they need to do a pelvic exam.   Please call Clar[facility] Che @ [phone] to schedule a dental appointment - they should call you but if you don't hear from them by Monday given them a call  Will see you back in 4 months.

## 2022-06-11 NOTE — Assessment & Plan Note (Signed)
4d of recurrent heavy bleeding following diagnostic D&C in late march. Pathology revealed no cancer / dysplasia concerns of endometrium. She is going through about 5 pads a day x 4d now.  We discussed plan to reach out to GYN today for further guidance - may be that these changes could be expected over the next 55m of IUD placement, however the fact she has not been able to feel her strings and has a sensation of foreign body in vagina I would prefer she go for pelvic exam.

## 2022-06-11 NOTE — Progress Notes (Signed)
Name: Catherine Simmons  DOB: Jun 24, 1985 MRN: [id number] PCP: Lennart Pall, MD     Subjective:   Chief Complaint  Patient presents with   Follow-up    Heavy vaginal bleeding started 4 days ago with cramping      HPI: Catherine Simmons is here for routine follow-up care.  Has been doing very well on her Biktarvy and has not missed any doses.  Even in the setting of significant high stress with working multiple jobs.  She is not sleeping much due to her very busy schedule.  She is hopeful that she can narrow down her 4 jobs to 2 soon.  Main concern today is return of heavy vaginal bleeding for the last 4 to 5 days now.  At the end of March she underwent a diagnostic D&C with Dr. Roma Kayser to workup abnormal uterine bleeding.  She also had a hormonal IUD placed at this time for better bleeding management due to refractory bleeding from Depo shots.  Pathology was negative for any malignancy or hyperplasia.  She says the bleeding had subsided initially but has been very heavy out of her normal cycle menses flow.  She has not reached out to her GYN team yet.  She goes through about 5 pads a day, notices clots and bright red flow.  She has not been able to feel her IUD strings and states that she feels like there is a tampon in her vaginal vault all the time.  She has a broken molar in the left mandible that she needs attention to with dentistry.  She recently completed a course of antibiotics for this.  It is uncomfortable with shooting pains at times but there is no drainage or swelling to the head or face.  No adenopathy no fevers chills.   Review of Systems  Constitutional:  Negative for chills, diaphoresis and unexpected weight change.  HENT:  Positive for dental problem.   Gastrointestinal:  Negative for blood in stool and rectal pain.  Genitourinary:  Positive for menstrual problem and pelvic pain. Negative for dyspareunia, dysuria, genital sores, urgency, vaginal discharge and vaginal  pain.  Skin:  Negative for rash.  Neurological:  Negative for dizziness, facial asymmetry, weakness and headaches.  Psychiatric/Behavioral:  Negative for dysphoric mood and sleep disturbance. The patient is not nervous/anxious.       Past Medical History:  Diagnosis Date   Anemia    Cholecystitis 07/16/2010   Depression    Genital warts 2004   Headache    Hemorrhoids    HIV (human immunodeficiency virus infection) (HCC)    Hx of pelvic inflammatory disease 08/31/2013   And hx of multiple STDs    Hypertension    Influenza A 01/17/2021   Ovarian cyst    Pregnancy induced hypertension    Sickle cell trait (HCC)    UTI (urinary tract infection)     Outpatient Medications Prior to Visit  Medication Sig Dispense Refill   amLODipine (NORVASC) 10 MG tablet Take 1 tablet (10 mg total) by mouth daily. 30 tablet 2   bictegravir-emtricitabine-tenofovir AF (BIKTARVY) 50-200-25 MG TABS tablet Take 1 tablet by mouth daily. 30 tablet 5   hydrochlorothiazide (HYDRODIURIL) 12.5 MG tablet Take 1 tablet (12.5 mg total) by mouth daily. 30 tablet 2   norethindrone (AYGESTIN) 5 MG tablet Take 3 tablets (15 mg total) by mouth daily. 90 tablet 2   No facility-administered medications prior to visit.     Allergies  Allergen Reactions   Butorphanol  Itching    Tolerates percocet   Stadol [Butorphanol Tartrate] Itching    Tolerates percocet    Social History   Tobacco Use   Smoking status: Some Days    Years: 15    Types: Cigarettes    Start date: 03/11/2012    Last attempt to quit: 09/2020    Years since quitting: 1.7   Smokeless tobacco: Never  Vaping Use   Vaping Use: Never used  Substance Use Topics   Alcohol use: Yes    Comment: occ   Drug use: Yes    Frequency: 5.0 times per week    Types: Marijuana     Social History   Substance and Sexual Activity  Sexual Activity Not Currently   Partners: Male   Birth control/protection: None, Injection   Comment: provided condoms  06/2022     Objective:   Vitals:   06/11/22 0845  BP: 136/86  Pulse: 69  Temp: 97.7 F (36.5 C)  TempSrc: Temporal  SpO2: 96%  Weight: 220 lb (99.8 kg)  Height: 5\' 2"  (1.575 m)   Body mass index is 40.24 kg/m.   Physical Exam Constitutional:      Appearance: Normal appearance. She is not ill-appearing.  HENT:     Mouth/Throat:     Mouth: Mucous membranes are moist.     Pharynx: Oropharynx is clear.  Eyes:     General: No scleral icterus. Cardiovascular:     Rate and Rhythm: Normal rate and regular rhythm.  Pulmonary:     Effort: Pulmonary effort is normal.  Neurological:     Mental Status: She is oriented to person, place, and time.  Psychiatric:        Mood and Affect: Mood normal.        Thought Content: Thought content normal.      Lab Results Lab Results  Component Value Date   WBC 3.9 03/24/2022   HGB 12.8 03/24/2022   HCT 40.1 03/24/2022   MCV 85 03/24/2022   PLT 243 03/24/2022    Lab Results  Component Value Date   CREATININE 0.60 05/06/2022   BUN 12 05/06/2022   NA 137 05/06/2022   K 3.6 05/06/2022   CL 107 05/06/2022   CO2 23 05/06/2022    Lab Results  Component Value Date   ALT 9 03/13/2022   AST 8 (L) 03/13/2022   ALKPHOS 67 11/29/2021   BILITOT 0.3 03/13/2022    Lab Results  Component Value Date   CHOL 175 03/13/2022   HDL 52 03/13/2022   LDLCALC 108 (H) 03/13/2022   TRIG 68 03/13/2022   CHOLHDL 3.4 03/13/2022   HIV 1 RNA Quant (Copies/mL)  Date Value  03/13/2022 Not Detected  01/05/2022 196 (H)  10/06/2021 Not Detected   HIV-1 RNA Viral Load  Date Value  11/19/2016 <20 copies/mL  05/15/2014 <40  04/09/2014 <40   CD4 (no units)  Date Value  04/09/2014 1,016  02/19/2014 776  12/27/2013 651   CD4 T Cell Abs (/uL)  Date Value  10/06/2021 875  08/29/2020 903  10/19/2019 1,026     Assessment & Plan:   Problem List Items Addressed This Visit       Unprioritized   Human immunodeficiency virus (HIV) disease  (HCC) - Primary (Chronic)    Well controlled on Biktarvy once daily. Will defer lab draw today with increased uterine bleeding - she was undetectable in February this year with healthy CD4 > 1100 and reports excellent adherence to her  medication. Will check at return visit in 65m HPV#2 given today with final dose next OV.  Pap smear up to date, mammogram at 40 to start.   Return in about 4 months (around 10/12/2022).       Relevant Orders   AMB REFERRAL TO [facility]   HPV 9-valent vaccine,Recombinat (Completed)   Abnormal uterine bleeding (AUB)    4d of recurrent heavy bleeding following diagnostic D&C in late march. Pathology revealed no cancer / dysplasia concerns of endometrium. She is going through about 5 pads a day x 4d now.  We discussed plan to reach out to GYN today for further guidance - may be that these changes could be expected over the next 68m of IUD placement, however the fact she has not been able to feel her strings and has a sensation of foreign body in vagina I would prefer she go for pelvic exam.       Broken tooth    She has a fractured molar in the left mandible. There is no signs of acute infection today (though just finished course of abx). I will help her complete dental referral and request urgent eval.  I asked her to contact me back if she notices changes that would be concerning for secondary infection.       Other Visit Diagnoses     Need for vaccination against human papillomavirus       Relevant Orders   HPV 9-valent vaccine,Recombinat (Completed)      Rexene Alberts, MSN, NP-C [facility] Pager: [phone] Office: [phone]  06/11/22  9:54 AM

## 2022-06-11 NOTE — Assessment & Plan Note (Signed)
Well controlled on Biktarvy once daily. Will defer lab draw today with increased uterine bleeding - she was undetectable in February this year with healthy CD4 > 1100 and reports excellent adherence to her medication. Will check at return visit in 39m HPV#2 given today with final dose next OV.  Pap smear up to date, mammogram at 40 to start.   Return in about 4 months (around 10/12/2022).

## 2022-06-11 NOTE — Assessment & Plan Note (Signed)
She has a fractured molar in the left mandible. There is no signs of acute infection today (though just finished course of abx). I will help her complete dental referral and request urgent eval.  I asked her to contact me back if she notices changes that would be concerning for secondary infection.

## 2022-06-18 ENCOUNTER — Other Ambulatory Visit (HOSPITAL_COMMUNITY): Payer: Self-pay

## 2022-06-22 ENCOUNTER — Other Ambulatory Visit (HOSPITAL_COMMUNITY): Payer: Self-pay

## 2022-06-22 ENCOUNTER — Other Ambulatory Visit: Payer: Self-pay

## 2022-06-29 ENCOUNTER — Ambulatory Visit: Payer: Medicaid Other | Admitting: Obstetrics and Gynecology

## 2022-06-30 ENCOUNTER — Ambulatory Visit (HOSPITAL_BASED_OUTPATIENT_CLINIC_OR_DEPARTMENT_OTHER): Payer: Medicaid Other | Admitting: Family Medicine

## 2022-07-04 ENCOUNTER — Emergency Department (HOSPITAL_COMMUNITY): Payer: Medicaid Other

## 2022-07-04 ENCOUNTER — Other Ambulatory Visit: Payer: Self-pay

## 2022-07-04 ENCOUNTER — Encounter (HOSPITAL_COMMUNITY): Payer: Self-pay | Admitting: Pharmacy Technician

## 2022-07-04 ENCOUNTER — Encounter (HOSPITAL_COMMUNITY): Payer: Self-pay | Admitting: *Deleted

## 2022-07-04 ENCOUNTER — Emergency Department (HOSPITAL_COMMUNITY)
Admission: EM | Admit: 2022-07-04 | Discharge: 2022-07-04 | Disposition: A | Discharge status: Home / Self Care | Payer: Medicaid Other | Attending: Emergency Medicine | Admitting: Emergency Medicine

## 2022-07-04 ENCOUNTER — Ambulatory Visit (HOSPITAL_COMMUNITY)
Admission: EM | Admit: 2022-07-04 | Discharge: 2022-07-04 | Disposition: A | Discharge status: Home / Self Care | Payer: Medicaid Other

## 2022-07-04 DIAGNOSIS — Z79899 Other long term (current) drug therapy: Secondary | ICD-10-CM | POA: Diagnosis not present

## 2022-07-04 DIAGNOSIS — R079 Chest pain, unspecified: Secondary | ICD-10-CM | POA: Diagnosis not present

## 2022-07-04 DIAGNOSIS — I10 Essential (primary) hypertension: Secondary | ICD-10-CM | POA: Insufficient documentation

## 2022-07-04 DIAGNOSIS — R0602 Shortness of breath: Secondary | ICD-10-CM | POA: Insufficient documentation

## 2022-07-04 DIAGNOSIS — R0789 Other chest pain: Secondary | ICD-10-CM

## 2022-07-04 DIAGNOSIS — Z21 Asymptomatic human immunodeficiency virus [HIV] infection status: Secondary | ICD-10-CM | POA: Diagnosis not present

## 2022-07-04 LAB — TROPONIN I (HIGH SENSITIVITY): Troponin I (High Sensitivity): 5 ng/L (ref <18)

## 2022-07-04 LAB — D-DIMER, QUANTITATIVE: D-Dimer, Quant: 0.39 ug/mL-FEU (ref 0.00–0.50)

## 2022-07-04 LAB — I-STAT BETA HCG BLOOD, ED (MC, WL, AP ONLY): I-stat hCG, quantitative: <5 m[IU]/mL (ref <5)

## 2022-07-04 LAB — BASIC METABOLIC PANEL
Anion gap: 10 (ref 5–15)
BUN: 8 mg/dL (ref 6–20)
CO2: 22 mmol/L (ref 22–32)
Calcium: 8.7 mg/dL — ABNORMAL LOW (ref 8.9–10.3)
Chloride: 102 mmol/L (ref 98–111)
Creatinine, Ser: 0.83 mg/dL (ref 0.44–1.00)
GFR, Estimated: >60 mL/min (ref >60)
Glucose, Bld: 125 mg/dL — ABNORMAL HIGH (ref 70–99)
Potassium: 3.3 mmol/L — ABNORMAL LOW (ref 3.5–5.1)
Sodium: 134 mmol/L — ABNORMAL LOW (ref 135–145)

## 2022-07-04 LAB — CBC
HCT: 38.3 % (ref 36.0–46.0)
Hemoglobin: 12.6 g/dL (ref 12.0–15.0)
MCH: 26.5 pg (ref 26.0–34.0)
MCHC: 32.9 g/dL (ref 30.0–36.0)
MCV: 80.6 fL (ref 80.0–100.0)
Platelets: 288 10*3/uL (ref 150–400)
RBC: 4.75 MIL/uL (ref 3.87–5.11)
RDW: 13.1 % (ref 11.5–15.5)
WBC: 6.6 10*3/uL (ref 4.0–10.5)
nRBC: 0 % (ref 0.0–0.2)

## 2022-07-04 MED ORDER — NAPROXEN 500 MG PO TABS: 500.0000 mg | ORAL_TABLET | Freq: Two times a day (BID) | ORAL | 0 refills | Status: DC

## 2022-07-04 NOTE — ED Provider Notes (Signed)
[place] EMERGENCY DEPARTMENT AT [facility] Provider Note   CSN: [id number] Arrival date & time: 07/04/22  1653     History  Chief Complaint  Patient presents with   Shortness of Breath    Catherine Simmons is a 37 y.o. female.   Shortness of Breath  This patient is a 37 year old female, she has HIV, she has hypertension, she reports that she this morning woke up and felt a little bit off like there was some numbness and tingling in her fingers, she had some symptoms chest pain and difficulty breathing this morning that seem to resolve and then come back while she was on her way to work.  She is virtually asymptomatic at this time.  She denies swelling of her legs, she has 2 jobs, she drives a bus and works in [employer], she smokes occasionally, no recent surgeries, she is on norethindrone, no prior cardiac or pulmonary history    Home Medications Prior to Admission medications   Medication Sig Start Date End Date Taking? Authorizing Provider  naproxen (NAPROSYN) 500 MG tablet Take 1 tablet (500 mg total) by mouth 2 (two) times daily with a meal. 07/04/22  Yes Eber Hong, MD  amLODipine (NORVASC) 10 MG tablet Take 1 tablet (10 mg total) by mouth daily. 03/13/22 07/28/22  Horton Finer, PA-C  bictegravir-emtricitabine-tenofovir AF (BIKTARVY) 50-200-25 MG TABS tablet Take 1 tablet by mouth daily. 03/13/22   Horton Finer, PA-C  hydrochlorothiazide (HYDRODIURIL) 12.5 MG tablet Take 1 tablet (12.5 mg total) by mouth daily. 03/13/22   Horton Finer, PA-C  norethindrone (AYGESTIN) 5 MG tablet Take 3 tablets (15 mg total) by mouth daily. 04/21/22   Lennart Pall, MD      Allergies    Butorphanol and Stadol [butorphanol tartrate]    Review of Systems   Review of Systems  Respiratory:  Positive for shortness of breath.   All other systems reviewed and are negative.   Physical Exam Updated Vital Signs BP (!) 136/104   Pulse 73   Temp 98.3 F  (36.8 C)   Resp 16   SpO2 100%  Physical Exam Vitals and nursing note reviewed.  Constitutional:      General: She is not in acute distress.    Appearance: She is well-developed.  HENT:     Head: Normocephalic and atraumatic.     Mouth/Throat:     Pharynx: No oropharyngeal exudate.  Eyes:     General: No scleral icterus.       Right eye: No discharge.        Left eye: No discharge.     Conjunctiva/sclera: Conjunctivae normal.     Pupils: Pupils are equal, round, and reactive to light.  Neck:     Thyroid: No thyromegaly.     Vascular: No JVD.  Cardiovascular:     Rate and Rhythm: Normal rate and regular rhythm.     Heart sounds: Normal heart sounds. No murmur heard.    No friction rub. No gallop.  Pulmonary:     Effort: Pulmonary effort is normal. No respiratory distress.     Breath sounds: Normal breath sounds. No wheezing or rales.  Abdominal:     General: Bowel sounds are normal. There is no distension.     Palpations: Abdomen is soft. There is no mass.     Tenderness: There is no abdominal tenderness.  Musculoskeletal:        General: No tenderness. Normal range  of motion.     Cervical back: Normal range of motion and neck supple.  Lymphadenopathy:     Cervical: No cervical adenopathy.  Skin:    General: Skin is warm and dry.     Findings: No erythema or rash.  Neurological:     Mental Status: She is alert.     Coordination: Coordination normal.  Psychiatric:        Behavior: Behavior normal.     ED Results / Procedures / Treatments   Labs (all labs ordered are listed, but only abnormal results are displayed) Labs Reviewed  BASIC METABOLIC PANEL - Abnormal; Notable for the following components:      Result Value   Sodium 134 (*)    Potassium 3.3 (*)    Glucose, Bld 125 (*)    Calcium 8.7 (*)    All other components within normal limits  CBC  D-DIMER, QUANTITATIVE  I-STAT BETA HCG BLOOD, ED ([facility], [facility], [facility] ONLY)  TROPONIN I (HIGH SENSITIVITY)     EKG EKG Interpretation  Date/Time:  Saturday Jul 04 2022 16:41:20 EDT Ventricular Rate:  73 PR Interval:  158 QRS Duration: 100 QT Interval:  360 QTC Calculation: 396 R Axis:   56 Text Interpretation: Normal sinus rhythm Minimal voltage criteria for LVH, may be normal variant ( Sokolow-Lyon ) Nonspecific T wave abnormality Abnormal ECG When compared with ECG of 04-Jul-2022 12:55, PREVIOUS ECG IS PRESENT No significant change was found Confirmed by Glynn Octave [phone]) on 07/04/2022 5:45:39 PM  Radiology DG Chest 2 View  Result Date: 07/04/2022 CLINICAL DATA:  Chest pain EXAM: CHEST - 2 VIEW COMPARISON:  11/02/2018 FINDINGS: The heart size and mediastinal contours are within normal limits. Both lungs are clear. The visualized skeletal structures are unremarkable. IMPRESSION: Normal study. Electronically Signed   By: Charlett Nose M.D.   On: 07/04/2022 18:04    Procedures Procedures    Medications Ordered in ED Medications - No data to display  ED Course/ Medical Decision Making/ A&P                             Medical Decision Making Amount and/or Complexity of Data Reviewed Labs: ordered. Radiology: ordered.  Risk Prescription drug management.    This patient presents to the ED for concern of chest pain and shortness of breath differential diagnosis includes reactive airway disease, pulmonary embolism, pneumonia, pneumothorax    Additional history obtained:  Additional history obtained from medical record External records from outside source obtained and reviewed including patient had D&C performed in March, no other significant recent surgeries   Lab Tests:  I Ordered, and personally interpreted labs.  The pertinent results include:  normal l abs - neormal trop and d dimer.   Imaging Studies ordered:  I ordered imaging studies including CXR  I independently visualized and interpreted imaging which showed no acute findings. I agree with the radiologist  interpretation   Medicines ordered and prescription drug management:  Home with nsaids as needed I have reviewed the patients home medicines and have made adjustments as needed   Problem List / ED Course:  No acute findings - low risk for ACS - dimer neg.   Social Determinants of Health:  Tobacco use.           Final Clinical Impression(s) / ED Diagnoses Final diagnoses:  Chest pain, unspecified type  Shortness of breath    Rx / DC Orders ED Discharge  Orders          Ordered    naproxen (NAPROSYN) 500 MG tablet  2 times daily with meals        07/04/22 1942              Eber Hong, MD 07/04/22 1944

## 2022-07-04 NOTE — ED Triage Notes (Signed)
Pt reports she had SHOB around 5Am and 1130 today.

## 2022-07-04 NOTE — Discharge Instructions (Addendum)
EKG in urgent care does not show any emergent findings but at this time we do not have a cause for your symptoms and you will need additional workup and monitoring to ensure nothing more serious is occurring  Please goes to the nearest emergency department for further evaluation and management

## 2022-07-04 NOTE — Discharge Instructions (Signed)
Your test are normal, ER for worsening symptoms Stay out of work for 24 hours.

## 2022-07-04 NOTE — ED Triage Notes (Signed)
Pt here with reports of intermittent chest pain since this morning along with shob.

## 2022-07-04 NOTE — ED Notes (Signed)
Pt ambulated back from lobby to the back corner of the ER. Pt's sats were at 100% immediately after ambulation.

## 2022-07-04 NOTE — ED Provider Notes (Signed)
Patient presents for evaluation of shortness of breath at rest, left-sided chest pain following the breast line, left-sided arm weakness, dizziness, mild bilateral blurred vision and general malaise beginning this morning at 5:30 AM.  Symptoms occurring intermittently, temporarily resolved until 11:30 AM and has been persistent since.  Feels as if it is difficult to take a deep breath but denies pain with breathing.  History of hypertension and intermittent tobacco use but denies cigarettes in the past 2 weeks.    History of hypertension, currently taking hydrochlorothiazide.  EKG shows normal sinus rhythm, some artifact present, vital signs are stable, based on symptomology patient is being sent to the nearest emergency department for further evaluation and management, to escort self.   Valinda Hoar, NP 07/04/22 1306

## 2022-07-15 ENCOUNTER — Other Ambulatory Visit (HOSPITAL_COMMUNITY): Payer: Self-pay

## 2022-07-17 ENCOUNTER — Other Ambulatory Visit (HOSPITAL_COMMUNITY): Payer: Self-pay

## 2022-07-20 ENCOUNTER — Other Ambulatory Visit (HOSPITAL_COMMUNITY): Payer: Self-pay

## 2022-07-20 ENCOUNTER — Encounter (HOSPITAL_COMMUNITY): Payer: Self-pay

## 2022-07-24 ENCOUNTER — Ambulatory Visit: Payer: Medicaid Other | Admitting: Obstetrics and Gynecology

## 2022-07-27 ENCOUNTER — Encounter: Payer: Self-pay | Admitting: Pharmacy Technician

## 2022-07-31 ENCOUNTER — Ambulatory Visit (INDEPENDENT_AMBULATORY_CARE_PROVIDER_SITE_OTHER): Payer: Medicaid Other | Admitting: Obstetrics and Gynecology

## 2022-07-31 ENCOUNTER — Encounter: Payer: Self-pay | Admitting: Obstetrics and Gynecology

## 2022-07-31 VITALS — BP 122/82 | HR 66 | Ht 62.0 in | Wt 227.0 lb

## 2022-07-31 DIAGNOSIS — Z30431 Encounter for routine checking of intrauterine contraceptive device: Secondary | ICD-10-CM | POA: Diagnosis not present

## 2022-07-31 DIAGNOSIS — N8111 Cystocele, midline: Secondary | ICD-10-CM

## 2022-07-31 NOTE — Progress Notes (Signed)
   RETURN GYNECOLOGY VISIT  Subjective:  Catherine Simmons is a 37 y.o. [id number] Mirena IUD in place (05/06/22) presenting for irregular bleeding  Is s/p hsc D&C with IUD placement by myself on 3/27. Path benign.   Reports vaginal bulge/pressure.  Had intercourse recently with bleeding after intercourse. Partner could feel strings. Bleeding is still irregular but lighter and better than her periods before the IUD.  Objective:   Vitals:   07/31/22 0933  BP: 122/82  Pulse: 66  Weight: 227 lb (103 kg)  Height: 5\' 2"  (1.575 m)    General:  Alert, oriented and cooperative. Patient is in no acute distress.  Skin: Skin is warm and dry. No rash noted.   Cardiovascular: Normal heart rate noted  Respiratory: Normal respiratory effort, no problems with respiration noted  Abdomen: Soft, non-tender, non-distended   Pelvic: NEFG. Anterior vagina comes to level of hymen with valsava, full POPQ deferred. No vaginal masses or foreign body within the vagina. Strings visualized at 3cm in length.   Exam performed in the presence of a chaperone  Assessment and Plan:  Catherine Simmons is a 37 y.o. with IUD in place & midline cystocele.  Reviewed that IUD appears appropriately positioned on exam, no e/o expulsion and strings are appropriate length. Offered to trim strings or remove IUD, but pt is currently happy with the effect it has had on her bleeding. Discussed that irregular bleeding is common and should improve over the next 3 months. Reviewed that strings will often soften/wrap around the cervix over time so that they are less noticeable by sexual partner.   We also discussed findings of cystocele on exam. Discussed that this is a common finding and is often related to age and prior vaginal deliveries. Reviewed options for management include expectant management, PFPT, pessary and surgery. Her symptoms are mild so she opts for expectant management at this time.  Catherine. Simmons plans to continue  IUD and monitor symptoms for the next 3 months. Will return prn or in 1 year for annual exam.  Return if symptoms worsen or fail to improve.  I spent a total of 15 minutes interviewing, counseling and examining the patient and an additional 6 minutes reviewing her chart and documenting our encounter. Total encounter time: 21 minutes.   Future Appointments  Date Time Provider Department Center  10/30/2022  8:45 AM Blanchard Kelch, NP [facility]   Lennart Pall, MD

## 2022-07-31 NOTE — Progress Notes (Signed)
37 y.o. GYN presents for Post Op FU.  C/o intermittent bleeding/ spotting since the end of April after having intercourse.

## 2022-08-28 ENCOUNTER — Other Ambulatory Visit: Payer: Self-pay | Admitting: Physician Assistant

## 2022-08-28 ENCOUNTER — Other Ambulatory Visit (HOSPITAL_COMMUNITY): Payer: Self-pay

## 2022-08-28 DIAGNOSIS — I10 Essential (primary) hypertension: Secondary | ICD-10-CM

## 2022-08-31 ENCOUNTER — Other Ambulatory Visit (HOSPITAL_COMMUNITY): Payer: Self-pay

## 2022-08-31 MED ORDER — HYDROCHLOROTHIAZIDE 12.5 MG PO TABS
12.5000 mg | ORAL_TABLET | Freq: Every day | ORAL | 2 refills | Status: DC
  Filled 2022-08-31: qty 30, 30d supply, fill #0
  Filled 2022-11-03: qty 30, 30d supply, fill #1

## 2022-08-31 MED ORDER — AMLODIPINE BESYLATE 10 MG PO TABS
10.0000 mg | ORAL_TABLET | Freq: Every day | ORAL | 2 refills | Status: DC
  Filled 2022-08-31: qty 30, 30d supply, fill #0
  Filled 2022-11-03: qty 30, 30d supply, fill #1

## 2022-09-02 ENCOUNTER — Other Ambulatory Visit: Payer: Self-pay | Admitting: Obstetrics and Gynecology

## 2022-09-02 ENCOUNTER — Other Ambulatory Visit (HOSPITAL_COMMUNITY): Payer: Self-pay

## 2022-09-02 ENCOUNTER — Telehealth: Payer: Self-pay | Admitting: Obstetrics and Gynecology

## 2022-09-02 MED ORDER — NORETHINDRONE ACETATE 5 MG PO TABS: 10.0000 mg | ORAL_TABLET | Freq: Every day | ORAL | 1 refills | Status: DC

## 2022-09-02 NOTE — Telephone Encounter (Signed)
Taking aygestin w/ IUD to control bleeding. Ran out last week and having heavy painful bleeding. Just confirmed at [facility] that IUD is in place. Will refill aygestin. Plan for 10mg  daily until end of September when IUD should have full effect.   Harvie Bridge, MD Obstetrician & Gynecologist, [facility] for [employer], [facility]

## 2022-10-05 ENCOUNTER — Other Ambulatory Visit: Payer: Self-pay

## 2022-10-20 ENCOUNTER — Other Ambulatory Visit: Payer: Self-pay | Admitting: Obstetrics and Gynecology

## 2022-10-30 ENCOUNTER — Ambulatory Visit: Payer: Medicaid Other | Admitting: Infectious Diseases

## 2022-11-03 ENCOUNTER — Encounter: Payer: Self-pay | Admitting: Pharmacist

## 2022-11-03 ENCOUNTER — Other Ambulatory Visit: Payer: Self-pay

## 2022-11-05 ENCOUNTER — Other Ambulatory Visit: Payer: Self-pay

## 2022-11-12 ENCOUNTER — Other Ambulatory Visit (HOSPITAL_COMMUNITY)
Admission: RE | Admit: 2022-11-12 | Discharge: 2022-11-12 | Disposition: A | Discharge status: Home / Self Care | Payer: Medicaid Other | Source: Ambulatory Visit | Attending: Infectious Diseases | Admitting: Infectious Diseases

## 2022-11-12 ENCOUNTER — Other Ambulatory Visit (HOSPITAL_COMMUNITY): Payer: Self-pay

## 2022-11-12 ENCOUNTER — Ambulatory Visit (INDEPENDENT_AMBULATORY_CARE_PROVIDER_SITE_OTHER): Payer: Medicaid Other | Admitting: Family

## 2022-11-12 ENCOUNTER — Other Ambulatory Visit: Payer: Self-pay

## 2022-11-12 ENCOUNTER — Encounter: Payer: Self-pay | Admitting: Family

## 2022-11-12 VITALS — BP 138/93 | HR 60 | Temp 98.1°F | Resp 16 | Wt 234.4 lb

## 2022-11-12 DIAGNOSIS — N898 Other specified noninflammatory disorders of vagina: Secondary | ICD-10-CM | POA: Diagnosis not present

## 2022-11-12 DIAGNOSIS — I10 Essential (primary) hypertension: Secondary | ICD-10-CM | POA: Diagnosis not present

## 2022-11-12 DIAGNOSIS — Z0000 Encounter for general adult medical examination without abnormal findings: Secondary | ICD-10-CM

## 2022-11-12 DIAGNOSIS — Z23 Encounter for immunization: Secondary | ICD-10-CM | POA: Diagnosis not present

## 2022-11-12 DIAGNOSIS — B20 Human immunodeficiency virus [HIV] disease: Secondary | ICD-10-CM | POA: Diagnosis present

## 2022-11-12 DIAGNOSIS — N644 Mastodynia: Secondary | ICD-10-CM | POA: Diagnosis not present

## 2022-11-12 MED ORDER — BIKTARVY 50-200-25 MG PO TABS
1.0000 | ORAL_TABLET | Freq: Every day | ORAL | 5 refills | Status: DC
  Filled 2022-11-12: qty 30, 30d supply, fill #0
  Filled 2022-12-01: qty 30, 30d supply, fill #1
  Filled 2023-03-13: qty 30, 30d supply, fill #2

## 2022-11-12 MED ORDER — AMLODIPINE BESYLATE 10 MG PO TABS
10.0000 mg | ORAL_TABLET | Freq: Every day | ORAL | 2 refills | Status: DC
  Filled 2022-11-12: qty 30, 30d supply, fill #0
  Filled 2022-12-01: qty 30, 30d supply, fill #1
  Filled 2023-03-13: qty 30, 30d supply, fill #2

## 2022-11-12 MED ORDER — HYDROCHLOROTHIAZIDE 12.5 MG PO TABS
12.5000 mg | ORAL_TABLET | Freq: Every day | ORAL | 4 refills | Status: DC
  Filled 2022-11-12: qty 30, 30d supply, fill #0
  Filled 2022-12-01: qty 30, 30d supply, fill #1
  Filled 2023-03-13: qty 30, 30d supply, fill #2
  Filled 2023-05-24 – 2023-06-03 (×2): qty 30, 30d supply, fill #3
  Filled 2023-07-08: qty 30, 30d supply, fill #4

## 2022-11-12 NOTE — Progress Notes (Signed)
Brief Narrative   Patient ID: [name], female    DOB: 1985-11-23, 37 y.o.   MRN: [id number]  Ms. Dungan is a 37 y/o AA female diagnosed with HIV disease in September 2014 with risk factor of heterosexual contact. Initial viral load of 1015 and CD4 count 620. 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 No history of opportunistic infection. [id number] negative. Previous ART experience with Triumeq, Prezcobix, Truvada, Isentress, Ritonavir and now [employer].   Subjective:    Chief Complaint  Patient presents with   Follow-up    B20 - C/o breast pain x 9 days.     HPI:  ARIEYANA BREDEHOEFT is a 37 y.o. female with HIV disease last seen on 03/13/22 by Arvilla Meres, PA-C with well controlled virus despite missing a few weeks of Biktarvy. Viral load was undetectable and CD4 count 1,122. Kidney function, liver function and electrolytes within normal ranges. Here today for follow up.   Ms. Hembree has been doing well since her last office visit and went to [place] in June to celebrate her daughter's graduation. Continues to take her Biktarvy as prescribed with no adverse side effects or problems obtaining medications. Covered by Medicaid. Has been having left sided breast pain and bilateral nipple tenderness going on for at least 10 days now. No fevers, chills, nipple discharge, or changes to breast. Also having vaginal discharge for several days and concerned for possible yeast infection. Reviewed vaccinations.   Denies fevers, chills, night sweats, headaches, changes in vision, neck pain/stiffness, nausea, diarrhea, vomiting, lesions or rashes.  Lab Results  Component Value Date   CD4TCELL 48 03/13/2022   CD4TABS 875 10/06/2021   Lab Results  Component Value Date   HIV1RNAQUANT Not Detected 03/13/2022     Allergies  Allergen Reactions   Butorphanol Itching    Tolerates percocet   Stadol [Butorphanol Tartrate] Itching    Tolerates percocet      Outpatient  Medications Prior to Visit  Medication Sig Dispense Refill   naproxen (NAPROSYN) 500 MG tablet Take 1 tablet (500 mg total) by mouth 2 (two) times daily with a meal. 30 tablet 0   norethindrone (AYGESTIN) 5 MG tablet TAKE 3 TABLETS(15 MG) BY MOUTH DAILY 90 tablet 2   amLODipine (NORVASC) 10 MG tablet Take 1 tablet (10 mg total) by mouth daily. 30 tablet 2   bictegravir-emtricitabine-tenofovir AF (BIKTARVY) 50-200-25 MG TABS tablet Take 1 tablet by mouth daily. 30 tablet 5   hydrochlorothiazide (HYDRODIURIL) 12.5 MG tablet Take 1 tablet (12.5 mg total) by mouth daily. 30 tablet 2   No facility-administered medications prior to visit.     Past Medical History:  Diagnosis Date   Anemia    Cholecystitis 07/16/2010   Depression    Genital warts 2004   Headache    Hemorrhoids    HIV (human immunodeficiency virus infection) (HCC)    Hx of pelvic inflammatory disease 08/31/2013   And hx of multiple STDs    Hypertension    Influenza A 01/17/2021   Ovarian cyst    Pregnancy induced hypertension    Sickle cell trait (HCC)    UTI (urinary tract infection)      Past Surgical History:  Procedure Laterality Date   CHOLECYSTECTOMY  07/19/2010   DILATION AND EVACUATION N/A 01/30/2021   Procedure: DILATATION AND EVACUATION;  Surgeon: Catalina Antigua, MD;  Location: [facility] OR;  Service: Gynecology;  Laterality: N/A;   EXTERNAL CEPHALIC VERSION  12/11/2016  HYSTEROSCOPY WITH D & C N/A 05/06/2022   Procedure: DILATATION AND CURETTAGE /HYSTEROSCOPY;  Surgeon: Lennart Pall, MD;  Location: [facility];  Service: Gynecology;  Laterality: N/A;   INTRAUTERINE DEVICE (IUD) INSERTION N/A 05/06/2022   Procedure: INTRAUTERINE DEVICE (IUD) INSERTION;  Surgeon: Lennart Pall, MD;  Location: [facility];  Service: Gynecology;  Laterality: N/A;   SHOULDER SURGERY  12/11/2021   WISDOM TOOTH EXTRACTION        Review of Systems  Constitutional:  Negative for  appetite change, chills, diaphoresis, fatigue, fever and unexpected weight change.  Eyes:        Negative for acute change in vision  Respiratory:  Negative for chest tightness, shortness of breath and wheezing.   Cardiovascular:  Negative for chest pain.  Gastrointestinal:  Negative for diarrhea, nausea and vomiting.  Genitourinary:  Negative for dysuria, pelvic pain and vaginal discharge.  Musculoskeletal:  Negative for neck pain and neck stiffness.  Skin:  Negative for rash.  Neurological:  Negative for seizures, syncope, weakness and headaches.  Hematological:  Negative for adenopathy. Does not bruise/bleed easily.  Psychiatric/Behavioral:  Negative for hallucinations.       Objective:    BP (!) 138/93   Pulse 60   Temp 98.1 F (36.7 C) (Temporal)   Resp 16   Wt 234 lb 6.4 oz (106.3 kg)   SpO2 100%   BMI 42.87 kg/m  Nursing note and vital signs reviewed.  Physical Exam Constitutional:      General: She is not in acute distress.    Appearance: She is well-developed.  Eyes:     Conjunctiva/sclera: Conjunctivae normal.  Cardiovascular:     Rate and Rhythm: Normal rate and regular rhythm.     Heart sounds: Normal heart sounds. No murmur heard.    No friction rub. No gallop.  Pulmonary:     Effort: Pulmonary effort is normal. No respiratory distress.     Breath sounds: Normal breath sounds. No wheezing or rales.  Chest:     Chest wall: No tenderness.  Abdominal:     General: Bowel sounds are normal.     Palpations: Abdomen is soft.     Tenderness: There is no abdominal tenderness.  Musculoskeletal:     Cervical back: Neck supple.  Lymphadenopathy:     Cervical: No cervical adenopathy.  Skin:    General: Skin is warm and dry.     Findings: No rash.  Neurological:     Mental Status: She is alert and oriented to person, place, and time.  Psychiatric:        Behavior: Behavior normal.        Thought Content: Thought content normal.        Judgment: Judgment  normal.         11/12/2022   10:46 AM 06/11/2022    8:54 AM 03/13/2022    9:36 AM 03/24/2021   11:18 AM 02/20/2021   11:07 AM  Depression screen PHQ 2/9  Decreased Interest 0 0 0 0 2  Down, Depressed, Hopeless 0 0 1 0 2  PHQ - 2 Score 0 0 1 0 4  Altered sleeping     2  Tired, decreased energy     2  Change in appetite     2  Feeling bad or failure about yourself      1  Trouble concentrating     1  Moving slowly or fidgety/restless     0  Suicidal thoughts     0  PHQ-9 Score     12       Assessment & Plan:    Patient Active Problem List   Diagnosis Date Noted   Vaginal discharge 11/12/2022   Breast pain 11/12/2022   Broken tooth 06/11/2022   Healthcare maintenance 10/06/2021   Headache 01/16/2021   Vaginal discomfort 09/30/2020   Menorrhagia with irregular cycle 09/30/2020   Fatty liver 06/20/2020   Abnormal uterine bleeding (AUB) 05/13/2018   Iron deficiency anemia 05/13/2018   Fatigue 01/03/2018   Sickle cell trait (HCC) 07/22/2016   BMI 40.0-44.9, adult (HCC) 07/22/2016   Hidradenitis suppurativa 05/13/2016   Marijuana use 12/26/2014   Chronic hypertension 03/19/2014   Anxiety 06/30/2013   Human immunodeficiency virus (HIV) disease (HCC) 10/12/2012     Problem List Items Addressed This Visit       Cardiovascular and Mediastinum   Chronic hypertension   Relevant Medications   amLODipine (NORVASC) 10 MG tablet   hydrochlorothiazide (HYDRODIURIL) 12.5 MG tablet     Other   Human immunodeficiency virus (HIV) disease (HCC) - Primary (Chronic)    Hortense continues to have well controlled virus with good adherence and tolerance to [employer]. Reviewed previous lab work and discussed plan of care and U equals U. Interested in Bruno and will obtain [employer] to ensure no underlying medication resistant mutations. Discussed the risks, benefits, and logistics of Cabenuva. Continue current dose of Biktarvy. Plan for follow up in 4 months or sooner if needed  pending lab work results and potential start of Cabenuva.       Relevant Medications   bictegravir-emtricitabine-tenofovir AF (BIKTARVY) 50-200-25 MG TABS tablet   Other Relevant Orders   COMPLETE METABOLIC PANEL WITH GFR   HIV-1 RNA quant-no reflex-bld   T-helper cell (CD4)- ([facility] clinic only)   Healthcare maintenance    Discussed importance of safe sexual practice and condom use. Condoms and STD testing offered.  Influenza vaccination updated.  Mammogram scheduled for breast pain.       Vaginal discharge    Ryane has vaginal discharge with concern for possible yeast infection. Will check swab for STD, yeast, and BV. Treatment pending lab work results.       Relevant Orders   RPR   Cervicovaginal ancillary only( [place])   Breast pain    Bilateral breast and nipple pain. Will check mammogram to rule out underlying pathology      Relevant Orders   MM 3D DIAGNOSTIC MAMMOGRAM BILATERAL BREAST   US LIMITED ULTRASOUND INCLUDING AXILLA LEFT BREAST    [place] LIMITED ULTRASOUND INCLUDING AXILLA RIGHT BREAST   Other Visit Diagnoses     Encounter for immunization       Relevant Orders   Flu vaccine trivalent PF, 6mos and older(Flulaval,Afluria,Fluarix,Fluzone) (Completed)        I am having Shatera L. Monestime maintain her naproxen, norethindrone, amLODipine, hydrochlorothiazide, and Biktarvy.   Meds ordered this encounter  Medications   amLODipine (NORVASC) 10 MG tablet    Sig: Take 1 tablet (10 mg total) by mouth daily.    Dispense:  30 tablet    Refill:  2    Please mail    Order Specific Question:   Supervising Provider    Answer:   Judyann Munson [[id number]]   hydrochlorothiazide (HYDRODIURIL) 12.5 MG tablet    Sig: Take 1 tablet (12.5 mg total) by mouth daily.    Dispense:  30 tablet    Refill:  4    Please mail    Order Specific Question:   Supervising Provider    Answer:   Judyann Munson [[id number]]   bictegravir-emtricitabine-tenofovir AF (BIKTARVY) 50-200-25 MG  TABS tablet    Sig: Take 1 tablet by mouth daily.    Dispense:  30 tablet    Refill:  5    Please mail  Initial fill has been confirmed with patient. Please complete workflow in [facility].    Patient has been on medication technically a renewal.    Order Specific Question:   Supervising Provider    Answer:   Judyann Munson [phone]    Order Specific Question:   Prescription Type:    Answer:   Renewal     Follow-up: Return in about 4 months (around 03/15/2023). or sooner if needed.    Marcos Eke, MSN, FNP-C Nurse Practitioner [facility] for [facility] Main number: [phone]

## 2022-11-12 NOTE — Progress Notes (Signed)
Specialty Pharmacy Initiation Note   Catherine Simmons is a 37 y.o. female who will be followed by the specialty pharmacy service for RxSp HIV    Review of administration, indication, effectiveness, safety, potential side effects, storage/disposable, and missed dose instructions occurred today for patient's specialty medication(s) Biktarvy    Patient did not have any additional questions or concerns.   Patient's therapy is appropriate to: Initiate    Goals Addressed             This Visit's Progress    Achieve Undetectable HIV Viral Load < 20       Patient is initiating therapy. Patient will maintain adherence.      Comply with lab assessments       Patient is initiating therapy. Patient will adhere to provider and/or lab appointments.      Maintain optimal adherence to therapy       Patient is initiating therapy. Patient will work on increased adherence.          Osker Ayoub L. Jannette Fogo, PharmD, BCIDP, AAHIVP, CPP Clinical Pharmacist Practitioner Infectious Diseases Clinical Pharmacist [facility] 11/12/2022, 11:40 AM

## 2022-11-12 NOTE — Patient Instructions (Addendum)
Nice to see you.  We will check your lab work today.  Continue to take your medication daily as prescribed.  Refills have been sent to the pharmacy.  Plan for follow up in 4 months or sooner if needed with lab work on the same day.  Have a great day and stay safe!  

## 2022-11-12 NOTE — Assessment & Plan Note (Signed)
Catherine Simmons has vaginal discharge with concern for possible yeast infection. Will check swab for STD, yeast, and BV. Treatment pending lab work results.

## 2022-11-12 NOTE — Assessment & Plan Note (Signed)
Discussed importance of safe sexual practice and condom use. Condoms and STD testing offered.  Influenza vaccination updated.  Mammogram scheduled for breast pain.

## 2022-11-12 NOTE — Progress Notes (Addendum)
Specialty Pharmacy Initial Fill Coordination Note  Catherine Simmons is a 37 y.o. female contacted today regarding refills of specialty medication(s) Bictegravir-Emtricitab-Tenofov   Patient requested Daryll Drown at [facility] at [place] date: 11/12/22   Medication will be filled on 11/12/22.   Patient is aware of $0 copayment.

## 2022-11-12 NOTE — Assessment & Plan Note (Signed)
Bilateral breast and nipple pain. Will check mammogram to rule out underlying pathology

## 2022-11-12 NOTE — Assessment & Plan Note (Signed)
Catherine Simmons continues to have well controlled virus with good adherence and tolerance to [employer]. Reviewed previous lab work and discussed plan of care and U equals U. Interested in [place] and will obtain [employer] to ensure no underlying medication resistant mutations. Discussed the risks, benefits, and logistics of Cabenuva. Continue current dose of Biktarvy. Plan for follow up in 4 months or sooner if needed pending lab work results and potential start of Cabenuva.

## 2022-11-13 LAB — T-HELPER CELL (CD4) - (RCID CLINIC ONLY)
CD4 % Helper T Cell: 46 % (ref 33–65)
CD4 T Cell Abs: 972 /uL (ref 400–1790)

## 2022-11-14 LAB — COMPLETE METABOLIC PANEL WITH GFR
AG Ratio: 1.5 (calc) (ref 1.0–2.5)
ALT: 14 U/L (ref 6–29)
AST: 11 U/L (ref 10–30)
Albumin: 4 g/dL (ref 3.6–5.1)
Alkaline phosphatase (APISO): 67 U/L (ref 31–125)
BUN: 14 mg/dL (ref 7–25)
CO2: 25 mmol/L (ref 20–32)
Calcium: 9.2 mg/dL (ref 8.6–10.2)
Chloride: 105 mmol/L (ref 98–110)
Creat: 0.57 mg/dL (ref 0.50–0.97)
Globulin: 2.6 g/dL (ref 1.9–3.7)
Glucose, Bld: 91 mg/dL (ref 65–99)
Potassium: 4.1 mmol/L (ref 3.5–5.3)
Sodium: 137 mmol/L (ref 135–146)
Total Bilirubin: 0.4 mg/dL (ref 0.2–1.2)
Total Protein: 6.6 g/dL (ref 6.1–8.1)
eGFR: 120 mL/min/{1.73_m2} (ref >=60)

## 2022-11-14 LAB — RPR: RPR Ser Ql: NONREACTIVE

## 2022-11-14 LAB — HIV-1 RNA QUANT-NO REFLEX-BLD
HIV 1 RNA Quant: NOT DETECTED {copies}/mL
HIV-1 RNA Quant, Log: NOT DETECTED {Log}

## 2022-11-16 ENCOUNTER — Other Ambulatory Visit (HOSPITAL_COMMUNITY): Payer: Self-pay

## 2022-11-16 ENCOUNTER — Other Ambulatory Visit: Payer: Self-pay

## 2022-11-16 LAB — CERVICOVAGINAL ANCILLARY ONLY
Bacterial Vaginitis (gardnerella): POSITIVE — AB
Candida Glabrata: NEGATIVE
Candida Vaginitis: NEGATIVE
Chlamydia: NEGATIVE
Comment: NEGATIVE
Comment: NEGATIVE
Comment: NEGATIVE
Comment: NEGATIVE
Comment: NEGATIVE
Comment: NORMAL
Neisseria Gonorrhea: NEGATIVE
Trichomonas: NEGATIVE

## 2022-11-16 MED ORDER — METRONIDAZOLE 500 MG PO TABS
500.0000 mg | ORAL_TABLET | Freq: Two times a day (BID) | ORAL | 0 refills | Status: DC
  Filled 2022-11-16: qty 14, 7d supply, fill #0

## 2022-11-16 NOTE — Addendum Note (Signed)
Addended by: Jeanine Luz D on: 11/16/2022 08:49 AM   Modules accepted: Orders

## 2022-11-17 ENCOUNTER — Encounter: Payer: Self-pay | Admitting: Pharmacist

## 2022-11-17 ENCOUNTER — Other Ambulatory Visit (HOSPITAL_COMMUNITY): Payer: Self-pay

## 2022-11-17 ENCOUNTER — Other Ambulatory Visit: Payer: Self-pay

## 2022-11-18 ENCOUNTER — Other Ambulatory Visit (HOSPITAL_COMMUNITY): Payer: Self-pay

## 2022-11-26 ENCOUNTER — Ambulatory Visit: Payer: Medicaid Other

## 2022-11-26 ENCOUNTER — Ambulatory Visit
Admission: RE | Admit: 2022-11-26 | Discharge: 2022-11-26 | Disposition: A | Discharge status: Home / Self Care | Payer: Medicaid Other | Source: Ambulatory Visit | Attending: Family | Admitting: Family

## 2022-11-26 DIAGNOSIS — N644 Mastodynia: Secondary | ICD-10-CM

## 2022-12-01 ENCOUNTER — Other Ambulatory Visit: Payer: Self-pay

## 2022-12-01 NOTE — Progress Notes (Signed)
Specialty Pharmacy Refill Coordination Note  Catherine Simmons is a 37 y.o. female contacted today regarding refills of specialty medication(s) Bictegravir-Emtricitab-Tenofov   Patient requested Delivery   Delivery date: 12/07/22   Verified address: [address] [place] [address]   Medication will be filled on 12/04/22.

## 2022-12-04 ENCOUNTER — Other Ambulatory Visit (HOSPITAL_COMMUNITY): Payer: Self-pay

## 2022-12-15 ENCOUNTER — Other Ambulatory Visit (HOSPITAL_COMMUNITY): Payer: Self-pay

## 2022-12-17 ENCOUNTER — Telehealth: Payer: Self-pay

## 2022-12-17 DIAGNOSIS — B20 Human immunodeficiency virus [HIV] disease: Secondary | ICD-10-CM

## 2022-12-17 MED ORDER — CABENUVA 600 & 900 MG/3ML IM SUER
1.0000 | INTRAMUSCULAR | 1 refills | Status: AC
  Filled 2022-12-18: qty 6, 30d supply, fill #0

## 2022-12-17 NOTE — Telephone Encounter (Signed)
Cumulative HIV Genotype Data  RT Mutations  V21I, K32R, V35T, K49R, V60I, W88S, Q102K/R, K103N, K122E, D123N, I135I/V, C162S, T165E, K173R, Q174K, D177E, G196E, E203E/D, Q207E, R211K, V245Q, E248N, A272S, N829F, A213Y, T286A, V292I, E297A, H315H/Y, G335G/D, F346H, M357R, G359S, K366R, I375V, T377L, S379A, K390R  PI Mutations  I13V, K14R, G16E, K20I, E35D, M36I, N37S, R41K, K43R, I64M, H69K, K70R, L89M  Integrase Mutations  E11D, K14R, S24N, V31I, M50I, L101I, T112I, V113I, T124G, T125A, G134N, K136T, D167E, V201I, T206S, T218T/I, N222K, V234I, D256E, R269K, S283G   Interpretation of Genotype Data per [employer] HIV Drug Resistance Database:  Nucleoside RTIs  Abacavir - Susceptible Zidovudine - Susceptible Emtricitabine - Susceptible Lamivudine - Susceptible Tenofovir - Susceptible   Non-Nucleoside RTIs  Doravirine - Susceptible Efavirenz - High-level resistance Etravirine - Susceptible Nevirapine - High-level resistance Rilpivirine -    Protease Inhibitors  Atazanavir - Susceptible Darunavir - Susceptible Lopinavir - Susceptible   Integrase Inhibitors  Bictegravir - Susceptible Cabotegravir - Susceptible Dolutegravir - Susceptible Elvitegravir - Susceptible Raltegravir - Susceptible

## 2022-12-17 NOTE — Telephone Encounter (Signed)
Discussed switch from [place] to [place].   Counseled that [place] is two separate intramuscular injections in the gluteal muscle on each side for each visit. Explained that the second injection is 30 days after the initial injection then every 2 months thereafter. Discussed the rare but significant chance of developing resistance despite compliance. Explained that showing up to injection appointments is very important and warned that if 2 appointments are missed, it will be reassessed by their provider whether they are a good candidate for injection therapy. Counseled on possible side effects associated with the injections such as injection site pain, which is usually mild to moderate in nature, injection site nodules, and injection site reactions.   Radhika is excited to start Cabenuva, and we have her on schedule for next week for her first administration.

## 2022-12-18 ENCOUNTER — Other Ambulatory Visit: Payer: Self-pay

## 2022-12-18 ENCOUNTER — Other Ambulatory Visit (HOSPITAL_COMMUNITY): Payer: Self-pay

## 2022-12-18 NOTE — Progress Notes (Signed)
Specialty Pharmacy Initial Fill Coordination Note  Catherine Simmons is a 37 y.o. female contacted today regarding refills of specialty medication(s) Cabotegravir & Rilpivirine   Patient requested Courier to Provider Office   Delivery date: 12/21/22   Verified address: [facility] [address] [place] [address]   Medication will be filled on 12/18/22.   Patient is aware of 0.00 copayment.

## 2022-12-21 ENCOUNTER — Telehealth: Payer: Self-pay

## 2022-12-21 ENCOUNTER — Other Ambulatory Visit (HOSPITAL_COMMUNITY): Payer: Self-pay

## 2022-12-21 NOTE — Telephone Encounter (Signed)
 [facility] Patient Advocate Encounter  Patient's medications CABENUVA have been couriered to [facility] from [facility] and will be administered at the patients appointment on 12/23/22.  Kae Heller, CPhT Specialty Pharmacy Patient [facility] for [facility] Phone: [phone] Fax:  [phone]

## 2022-12-22 NOTE — Telephone Encounter (Signed)
Note Rilpivirine phenotype: Susceptible

## 2022-12-22 NOTE — Progress Notes (Unsigned)
HPI: Catherine Simmons is a 37 y.o. female who presents to the [facility] pharmacy clinic for [place] administration.  Patient Active Problem List   Diagnosis Date Noted   Vaginal discharge 11/12/2022   Breast pain 11/12/2022   Broken tooth 06/11/2022   Healthcare maintenance 10/06/2021   Headache 01/16/2021   Vaginal discomfort 09/30/2020   Menorrhagia with irregular cycle 09/30/2020   Fatty liver 06/20/2020   Abnormal uterine bleeding (AUB) 05/13/2018   Iron deficiency anemia 05/13/2018   Fatigue 01/03/2018   Sickle cell trait (HCC) 07/22/2016   BMI 40.0-44.9, adult (HCC) 07/22/2016   Hidradenitis suppurativa 05/13/2016   Marijuana use 12/26/2014   Chronic hypertension 03/19/2014   Anxiety 06/30/2013   Human immunodeficiency virus (HIV) disease (HCC) 10/12/2012    Patient's Medications  New Prescriptions   No medications on file  Previous Medications   AMLODIPINE (NORVASC) 10 MG TABLET    Take 1 tablet (10 mg total) by mouth daily.   BICTEGRAVIR-EMTRICITABINE-TENOFOVIR AF (BIKTARVY) 50-200-25 MG TABS TABLET    Take 1 tablet by mouth daily.   CABOTEGRAVIR & RILPIVIRINE ER (CABENUVA) 600 & 900 MG/3ML INJECTION    Inject 1 kit into the muscle every 30 (thirty) days.   HYDROCHLOROTHIAZIDE (HYDRODIURIL) 12.5 MG TABLET    Take 1 tablet (12.5 mg total) by mouth daily.   METRONIDAZOLE (FLAGYL) 500 MG TABLET    Take 1 tablet (500 mg total) by mouth 2 (two) times daily.   NAPROXEN (NAPROSYN) 500 MG TABLET    Take 1 tablet (500 mg total) by mouth 2 (two) times daily with a meal.   NORETHINDRONE (AYGESTIN) 5 MG TABLET    TAKE 3 TABLETS(15 MG) BY MOUTH DAILY  Modified Medications   No medications on file  Discontinued Medications   No medications on file    Allergies: Allergies  Allergen Reactions   Butorphanol Itching    Tolerates percocet   Stadol [Butorphanol Tartrate] Itching    Tolerates percocet    Labs: Lab Results  Component Value Date   HIV1RNAQUANT Not Detected  11/12/2022   HIV1RNAQUANT Not Detected 03/13/2022   HIV1RNAQUANT 196 (H) 01/05/2022   HIV1RNAVL <20 11/19/2016   HIV1RNAVL <40 05/15/2014   HIV1RNAVL <40 04/09/2014   CD4TABS 972 11/12/2022   CD4TABS 875 10/06/2021   CD4TABS 903 08/29/2020    RPR and STI Lab Results  Component Value Date   LABRPR NON-REACTIVE 11/12/2022   LABRPR NON-REACTIVE 01/05/2022   LABRPR NON-REACTIVE 10/06/2021   LABRPR NON-REACTIVE 03/24/2021   LABRPR NON REACTIVE 01/16/2021    STI Results GC CT  11/12/2022 11:13 AM Negative  Negative   04/21/2022 11:48 AM Negative  Negative   01/05/2022  4:04 PM Negative    Negative  Negative    Negative   10/06/2021 11:26 AM Negative  Negative   01/20/2021 11:46 AM Negative  Negative   12/20/2020 10:15 AM Negative  Negative   09/30/2020 10:17 AM Negative    Negative  Negative    Negative   06/20/2020  9:58 AM Negative  Negative   12/26/2019  9:45 AM Negative  Negative   11/09/2019 10:00 AM Positive  Negative   04/27/2018 12:00 AM Negative  Negative   04/28/2017 12:00 AM Negative  Negative   12/03/2016 12:00 AM Negative  Negative   10/08/2016 12:00 AM Negative  Negative   08/18/2016 12:00 AM Negative  Negative   07/19/2016 12:00 AM Negative  Negative   07/02/2016 12:00 AM  Negative  05/12/2016 12:00 AM Negative  Negative   11/06/2015 12:00 AM Negative  Negative   06/20/2015 12:00 AM  Negative         This result is from an external source.   Multiple values from one day are sorted in reverse-chronological order    Hepatitis B Lab Results  Component Value Date   HEPBSAB POS (A) 10/20/2012   HEPBSAG NON REACTIVE 01/16/2021   HEPBCAB NEG 10/20/2012   Hepatitis C No results found for: "HEPCAB", "HCVRNAPCRQN" Hepatitis A Lab Results  Component Value Date   HAV NEG 10/20/2012   Lipids: Lab Results  Component Value Date   CHOL 175 03/13/2022   TRIG 68 03/13/2022   HDL 52 03/13/2022   CHOLHDL 3.4 03/13/2022   VLDL 12 10/20/2012    LDLCALC 108 (H) 03/13/2022    Current HIV Regimen: Biktarvy  TARGET DATE: The 13th of the month  Assessment: Raelena presents today for her first initiation injection for Cabenuva. Counseled that [place] is two separate intramuscular injections in the gluteal muscle on each side for each visit. Explained that the second injection is 30 days after the initial injection then every 2 months thereafter. Discussed the rare but significant chance of developing resistance despite compliance. Explained that showing up to injection appointments is very important and warned that if 2 appointments are missed, it will be reassessed by their provider whether they are a good candidate for injection therapy. Counseled on possible side effects associated with the injections such as injection site pain, which is usually mild to moderate in nature, injection site nodules, and injection site reactions. Asked to call the clinic or send me a mychart message if they experience any issues, such as fatigue, nausea, headache, rash, or dizziness. Advised that they can take ibuprofen or tylenol for injection site pain if needed.   Immunizations: Due for HPV 3/3, COVID. Laynee *** these today. Additionally due for HAV antibody post immunization last year.  Administered cabotegravir 600mg /37mL in left upper outer quadrant of the gluteal muscle. Administered rilpivirine 900 mg/37mL in the right upper outer quadrant of the gluteal muscle. Monitored patient for 10 minutes after injection. Injections were tolerated well without issue. Counseled to stop taking Biktarvy after today's dose and to call with any issues that may arise. Will make follow up appointments for second initiation injection in 30 days and then maintenance injections every 2 months thereafter.   Plan: - Stop Biktarvy after today's dose - First Cabenuva injections administered - Immunizations: *** - Second initiation injection scheduled for *** - Maintenance  injections scheduled for *** - Call with any issues or questions  Lora Paula, PharmD PGY-2 Infectious Diseases Pharmacy Resident [facility] 12/22/2022 12:51 PM

## 2022-12-23 ENCOUNTER — Ambulatory Visit: Payer: 59 | Admitting: Pharmacist

## 2022-12-25 ENCOUNTER — Other Ambulatory Visit (HOSPITAL_COMMUNITY): Payer: Self-pay

## 2022-12-31 ENCOUNTER — Encounter (HOSPITAL_BASED_OUTPATIENT_CLINIC_OR_DEPARTMENT_OTHER): Payer: Self-pay | Admitting: Family Medicine

## 2023-01-01 ENCOUNTER — Telehealth: Payer: Self-pay

## 2023-01-01 NOTE — Telephone Encounter (Signed)
Left voicemail regarding no show for first [facility] appointment. Will plan to follow up again.

## 2023-01-04 ENCOUNTER — Other Ambulatory Visit (HOSPITAL_COMMUNITY): Payer: Self-pay

## 2023-01-05 ENCOUNTER — Other Ambulatory Visit: Payer: Self-pay

## 2023-01-05 ENCOUNTER — Encounter: Payer: Self-pay | Admitting: Pharmacist

## 2023-01-05 NOTE — Telephone Encounter (Signed)
Patient missed first Cabenuva appointment last week and has not called back to reschedule. Will send MyChart message today as well. Otherwise, will await patient's response.  Margarite Gouge, PharmD, CPP, BCIDP, AAHIVP Clinical Pharmacist Practitioner Infectious Diseases Clinical Pharmacist [facility]

## 2023-01-06 NOTE — Telephone Encounter (Signed)
FYI

## 2023-01-06 NOTE — Telephone Encounter (Signed)
Can you respond about scheduling in mid-December?

## 2023-01-17 ENCOUNTER — Other Ambulatory Visit: Payer: Self-pay

## 2023-01-17 ENCOUNTER — Emergency Department (HOSPITAL_COMMUNITY): Payer: 59

## 2023-01-17 ENCOUNTER — Encounter (HOSPITAL_COMMUNITY): Payer: Self-pay

## 2023-01-17 ENCOUNTER — Emergency Department (HOSPITAL_COMMUNITY)
Admission: EM | Admit: 2023-01-17 | Discharge: 2023-01-17 | Disposition: A | Discharge status: Home / Self Care | Payer: 59 | Attending: Emergency Medicine | Admitting: Emergency Medicine

## 2023-01-17 DIAGNOSIS — Z21 Asymptomatic human immunodeficiency virus [HIV] infection status: Secondary | ICD-10-CM | POA: Insufficient documentation

## 2023-01-17 DIAGNOSIS — M542 Cervicalgia: Secondary | ICD-10-CM | POA: Insufficient documentation

## 2023-01-17 DIAGNOSIS — I10 Essential (primary) hypertension: Secondary | ICD-10-CM | POA: Diagnosis not present

## 2023-01-17 LAB — CBC WITH DIFFERENTIAL/PLATELET
Abs Immature Granulocytes: 0.01 10*3/uL (ref 0.00–0.07)
Basophils Absolute: 0 10*3/uL (ref 0.0–0.1)
Basophils Relative: 1 %
Eosinophils Absolute: 0.1 10*3/uL (ref 0.0–0.5)
Eosinophils Relative: 3 %
HCT: 39.5 % (ref 36.0–46.0)
Hemoglobin: 13 g/dL (ref 12.0–15.0)
Immature Granulocytes: 0 %
Lymphocytes Relative: 51 %
Lymphs Abs: 2.5 10*3/uL (ref 0.7–4.0)
MCH: 28.4 pg (ref 26.0–34.0)
MCHC: 32.9 g/dL (ref 30.0–36.0)
MCV: 86.4 fL (ref 80.0–100.0)
Monocytes Absolute: 0.5 10*3/uL (ref 0.1–1.0)
Monocytes Relative: 9 %
Neutro Abs: 1.8 10*3/uL (ref 1.7–7.7)
Neutrophils Relative %: 36 %
Platelets: 256 10*3/uL (ref 150–400)
RBC: 4.57 MIL/uL (ref 3.87–5.11)
RDW: 12.8 % (ref 11.5–15.5)
WBC: 4.9 10*3/uL (ref 4.0–10.5)
nRBC: 0 % (ref 0.0–0.2)

## 2023-01-17 LAB — COMPREHENSIVE METABOLIC PANEL
ALT: 16 U/L (ref 0–44)
AST: 14 U/L — ABNORMAL LOW (ref 15–41)
Albumin: 3.3 g/dL — ABNORMAL LOW (ref 3.5–5.0)
Alkaline Phosphatase: 63 U/L (ref 38–126)
Anion gap: 7 (ref 5–15)
BUN: 9 mg/dL (ref 6–20)
CO2: 23 mmol/L (ref 22–32)
Calcium: 8.9 mg/dL (ref 8.9–10.3)
Chloride: 109 mmol/L (ref 98–111)
Creatinine, Ser: 0.69 mg/dL (ref 0.44–1.00)
GFR, Estimated: >60 mL/min (ref >60)
Glucose, Bld: 101 mg/dL — ABNORMAL HIGH (ref 70–99)
Potassium: 3.7 mmol/L (ref 3.5–5.1)
Sodium: 139 mmol/L (ref 135–145)
Total Bilirubin: 0.7 mg/dL (ref <1.2)
Total Protein: 6.5 g/dL (ref 6.5–8.1)

## 2023-01-17 LAB — MAGNESIUM: Magnesium: 1.9 mg/dL (ref 1.7–2.4)

## 2023-01-17 MED ORDER — CYCLOBENZAPRINE HCL 10 MG PO TABS: 10.0000 mg | ORAL_TABLET | Freq: Two times a day (BID) | ORAL | 0 refills | Status: DC | PRN

## 2023-01-17 MED ORDER — DEXAMETHASONE 4 MG PO TABS
10.0000 mg | ORAL_TABLET | Freq: Once | ORAL | Status: AC
  Administered 2023-01-17: 10 mg via ORAL
  Filled 2023-01-17: qty 3

## 2023-01-17 MED ORDER — OXYCODONE HCL 5 MG PO TABS
5.0000 mg | ORAL_TABLET | Freq: Once | ORAL | Status: AC
  Administered 2023-01-17: 5 mg via ORAL
  Filled 2023-01-17: qty 1

## 2023-01-17 MED ORDER — OXYCODONE HCL 5 MG PO TABS: 5.0000 mg | ORAL_TABLET | Freq: Four times a day (QID) | ORAL | 0 refills | Status: AC | PRN

## 2023-01-17 MED ORDER — KETOROLAC TROMETHAMINE 60 MG/2ML IM SOLN
60.0000 mg | Freq: Once | INTRAMUSCULAR | Status: AC
Start: 2023-01-17 — End: 2023-01-17
  Administered 2023-01-17: 60 mg via INTRAMUSCULAR
  Filled 2023-01-17: qty 2

## 2023-01-17 MED ORDER — LIDOCAINE 5 % EX PTCH: 1.0000 | MEDICATED_PATCH | CUTANEOUS | 0 refills | Status: AC

## 2023-01-17 MED ORDER — DIAZEPAM 2 MG PO TABS
2.0000 mg | ORAL_TABLET | Freq: Once | ORAL | Status: AC
  Administered 2023-01-17: 2 mg via ORAL
  Filled 2023-01-17: qty 1

## 2023-01-17 MED ORDER — METHYLPREDNISOLONE 4 MG PO TBPK: ORAL_TABLET | ORAL | 0 refills | Status: DC

## 2023-01-17 MED ORDER — LIDOCAINE 5 % EX PTCH
1.0000 | MEDICATED_PATCH | CUTANEOUS | Status: DC
  Administered 2023-01-17: 1 via TRANSDERMAL
  Filled 2023-01-17: qty 1

## 2023-01-17 MED ORDER — ACETAMINOPHEN 500 MG PO TABS
1000.0000 mg | ORAL_TABLET | Freq: Once | ORAL | Status: AC
  Administered 2023-01-17: 1000 mg via ORAL
  Filled 2023-01-17: qty 2

## 2023-01-17 NOTE — Discharge Instructions (Signed)
Take 1000 mg of Tylenol every 6 hours as needed for pain.  Take Medrol Dosepak as prescribed.  Take your first dose tomorrow.  I have prescribed you a muscle relaxant called Flexeril and a narcotic pain medicine called Roxicodone.  Take as prescribed.  These medications are sedating.  Do not mix with alcohol or drugs or dangerous activities including driving.  I have ordered you lidocaine patches as well to take.  Care doctor.

## 2023-01-17 NOTE — ED Triage Notes (Signed)
Pt states her neck started hurting 2 days ago and has worsened. Pt states her head and shoulders are hurting today. Pt unable to move head side to side. Pt able to get chin halfway to chest. Pt denies fevers. Pt has had a cough.

## 2023-01-17 NOTE — ED Provider Triage Note (Signed)
Emergency Medicine Provider Triage Evaluation Note  Catherine Simmons , a 37 y.o. female  was evaluated in triage.  Pt complains of neck pain, HA, and stiffness for the past three days.  She sought ED evaluation because she had some dizziness and her son had to help her to bed  Review of Systems  Positive: neck pain, HA, and stiffness Negative: fevers  Physical Exam  BP (!) 134/106 (BP Location: Right Arm)   Pulse 75   Temp 98.3 F (36.8 C)   Resp 15   Ht 5\' 2"  (1.575 m)   Wt 104.3 kg   SpO2 99%   BMI 42.07 kg/m  Gen:   Awake, no distress   Resp:  Normal effort  MSK:   Moves extremities without difficulty  Other:    Medical Decision Making  Medically screening exam initiated at 2:59 PM.  Appropriate orders placed.  Catherine Simmons was informed that the remainder of the evaluation will be completed by another provider, this initial triage assessment does not replace that evaluation, and the importance of remaining in the ED until their evaluation is complete.  Labs and imaging ordered   Catherine Simmons, [place] 01/17/23 1512

## 2023-01-17 NOTE — ED Provider Notes (Signed)
[place] EMERGENCY DEPARTMENT AT [facility] Provider Note   CSN: [id number] Arrival date & time: 01/17/23  1407     History  Chief Complaint  Patient presents with   Neck Pain    Catherine Simmons is a 37 y.o. female.  Patient here with left-sided neck pain and stiffness for the last few days.  She denies any fevers or chills.  No obvious strenuous activities or trauma.  She denies any weakness numbness tingling.  No headache vision loss speech changes.  History of hypertension HIV depression.  Pain mostly in the left side of her neck worse when she moves.  The history is provided by the patient.       Home Medications Prior to Admission medications   Medication Sig Start Date End Date Taking? Authorizing Provider  cyclobenzaprine (FLEXERIL) 10 MG tablet Take 1 tablet (10 mg total) by mouth 2 (two) times daily as needed for muscle spasms. 01/17/23  Yes Armand Preast, DO  lidocaine (LIDODERM) 5 % Place 1 patch onto the skin daily. Remove & Discard patch within 12 hours or as directed by MD 01/17/23  Yes Koben Daman, DO  methylPREDNISolone (MEDROL DOSEPAK) 4 MG TBPK tablet Follow package insert 01/17/23  Yes Nels Munn, DO  oxyCODONE (ROXICODONE) 5 MG immediate release tablet Take 1 tablet (5 mg total) by mouth every 6 (six) hours as needed for up to 10 days for breakthrough pain. 01/17/23 01/27/23 Yes Sharunda Salmon, DO  amLODipine (NORVASC) 10 MG tablet Take 1 tablet (10 mg total) by mouth daily. 11/12/22 02/10/23  Veryl Speak, FNP  bictegravir-emtricitabine-tenofovir AF (BIKTARVY) 50-200-25 MG TABS tablet Take 1 tablet by mouth daily. 11/12/22   Veryl Speak, FNP  cabotegravir & rilpivirine ER (CABENUVA) 600 & 900 MG/3ML injection Inject 1 kit into the muscle every 30 (thirty) days. 12/17/22   Jennette Kettle, RPH-CPP  hydrochlorothiazide (HYDRODIURIL) 12.5 MG tablet Take 1 tablet (12.5 mg total) by mouth daily. 11/12/22   Veryl Speak, FNP   metroNIDAZOLE (FLAGYL) 500 MG tablet Take 1 tablet (500 mg total) by mouth 2 (two) times daily. 11/16/22   Veryl Speak, FNP  naproxen (NAPROSYN) 500 MG tablet Take 1 tablet (500 mg total) by mouth 2 (two) times daily with a meal. 07/04/22   Eber Hong, MD  norethindrone (AYGESTIN) 5 MG tablet TAKE 3 TABLETS(15 MG) BY MOUTH DAILY 10/28/22   Lennart Pall, MD      Allergies    Butorphanol and Stadol [butorphanol tartrate]    Review of Systems   Review of Systems  Physical Exam Updated Vital Signs BP 139/89   Pulse 73   Temp 98.4 F (36.9 C) (Oral)   Resp 20   Ht 5\' 2"  (1.575 m)   Wt 104.3 kg   SpO2 100%   BMI 42.07 kg/m  Physical Exam Vitals and nursing note reviewed.  Constitutional:      General: She is not in acute distress.    Appearance: She is well-developed. She is not ill-appearing.  HENT:     Head: Normocephalic and atraumatic.     Nose: Nose normal.     Mouth/Throat:     Mouth: Mucous membranes are moist.  Eyes:     Extraocular Movements: Extraocular movements intact.     Conjunctiva/sclera: Conjunctivae normal.     Pupils: Pupils are equal, round, and reactive to light.  Cardiovascular:     Rate and Rhythm: Normal rate and regular rhythm.  Pulses: Normal pulses.     Heart sounds: Normal heart sounds. No murmur heard. Pulmonary:     Effort: Pulmonary effort is normal. No respiratory distress.     Breath sounds: Normal breath sounds.  Abdominal:     Palpations: Abdomen is soft.     Tenderness: There is no abdominal tenderness.  Musculoskeletal:        General: Tenderness present. No swelling.     Cervical back: Normal range of motion and neck supple.     Comments: Tenderness to the left paracervical spinal muscles and left trapezius muscles worse with movement with increased tone  Skin:    General: Skin is warm and dry.     Capillary Refill: Capillary refill takes less than 2 seconds.  Neurological:     General: No focal deficit present.      Mental Status: She is alert and oriented to person, place, and time.     Cranial Nerves: No cranial nerve deficit.     Sensory: No sensory deficit.     Motor: No weakness.     Coordination: Coordination normal.     Comments: 5+ out of 5 strength throughout, normal sensation, no drift, normal finger-nose-finger, normal speech  Psychiatric:        Mood and Affect: Mood normal.     ED Results / Procedures / Treatments   Labs (all labs ordered are listed, but only abnormal results are displayed) Labs Reviewed  COMPREHENSIVE METABOLIC PANEL - Abnormal; Notable for the following components:      Result Value   Glucose, Bld 101 (*)    Albumin 3.3 (*)    AST 14 (*)    All other components within normal limits  CBC WITH DIFFERENTIAL/PLATELET  MAGNESIUM    EKG None  Radiology DG Chest 2 View  Result Date: 01/17/2023 CLINICAL DATA:  Cough EXAM: CHEST - 2 VIEW COMPARISON:  Chest radiograph dated 07/04/2022. FINDINGS: The heart size and mediastinal contours are within normal limits. Both lungs are clear. The visualized skeletal structures are unremarkable. IMPRESSION: No active cardiopulmonary disease. Electronically Signed   By: Romona Curls M.D.   On: 01/17/2023 16:27   CT Head Wo Contrast  Result Date: 01/17/2023 CLINICAL DATA:  Neck pain beginning 2 days ago. Now also with Head and Shoulders hurting. EXAM: CT HEAD WITHOUT CONTRAST CT CERVICAL SPINE WITHOUT CONTRAST TECHNIQUE: Multidetector CT imaging of the head and cervical spine was performed following the standard protocol without intravenous contrast. Multiplanar CT image reconstructions of the cervical spine were also generated. RADIATION DOSE REDUCTION: This exam was performed according to the departmental dose-optimization program which includes automated exposure control, adjustment of the mA and/or kV according to patient size and/or use of iterative reconstruction technique. COMPARISON:  Head CT, 01/15/2022. FINDINGS: CT  HEAD FINDINGS Brain: No evidence of acute infarction, hemorrhage, hydrocephalus, extra-axial collection or mass lesion/mass effect. Vascular: No hyperdense vessel or unexpected calcification. Skull: Normal. Negative for fracture or focal lesion. Sinuses/Orbits: Globes and orbits are unremarkable. Visualized sinuses are clear. Other: None. CT CERVICAL SPINE FINDINGS Alignment: Normal. Skull base and vertebrae: No acute fracture. No primary bone lesion or focal pathologic process. Soft tissues and spinal canal: No prevertebral fluid or swelling. No visible canal hematoma. Disc levels: Disc spaces are well maintained. No disc bulging or evidence of a disc herniation. No stenosis. Upper chest: Negative. Other: None. IMPRESSION: 1. Head CT. Normal. 2. Cervical CT. Normal. Electronically Signed   By: Amie Portland M.D.   On:  01/17/2023 16:21   CT Cervical Spine Wo Contrast  Result Date: 01/17/2023 CLINICAL DATA:  Neck pain beginning 2 days ago. Now also with Head and Shoulders hurting. EXAM: CT HEAD WITHOUT CONTRAST CT CERVICAL SPINE WITHOUT CONTRAST TECHNIQUE: Multidetector CT imaging of the head and cervical spine was performed following the standard protocol without intravenous contrast. Multiplanar CT image reconstructions of the cervical spine were also generated. RADIATION DOSE REDUCTION: This exam was performed according to the departmental dose-optimization program which includes automated exposure control, adjustment of the mA and/or kV according to patient size and/or use of iterative reconstruction technique. COMPARISON:  Head CT, 01/15/2022. FINDINGS: CT HEAD FINDINGS Brain: No evidence of acute infarction, hemorrhage, hydrocephalus, extra-axial collection or mass lesion/mass effect. Vascular: No hyperdense vessel or unexpected calcification. Skull: Normal. Negative for fracture or focal lesion. Sinuses/Orbits: Globes and orbits are unremarkable. Visualized sinuses are clear. Other: None. CT CERVICAL SPINE  FINDINGS Alignment: Normal. Skull base and vertebrae: No acute fracture. No primary bone lesion or focal pathologic process. Soft tissues and spinal canal: No prevertebral fluid or swelling. No visible canal hematoma. Disc levels: Disc spaces are well maintained. No disc bulging or evidence of a disc herniation. No stenosis. Upper chest: Negative. Other: None. IMPRESSION: 1. Head CT. Normal. 2. Cervical CT. Normal. Electronically Signed   By: Amie Portland M.D.   On: 01/17/2023 16:21    Procedures Procedures    Medications Ordered in ED Medications  lidocaine (LIDODERM) 5 % 1 patch (1 patch Transdermal Patch Applied 01/17/23 1716)  diazepam (VALIUM) tablet 2 mg (2 mg Oral Given 01/17/23 1717)  acetaminophen (TYLENOL) tablet 1,000 mg (1,000 mg Oral Given 01/17/23 1717)  ketorolac (TORADOL) injection 60 mg (60 mg Intramuscular Given 01/17/23 1717)  dexamethasone (DECADRON) tablet 10 mg (10 mg Oral Given 01/17/23 1716)  oxyCODONE (Oxy IR/ROXICODONE) immediate release tablet 5 mg (5 mg Oral Given 01/17/23 1819)    ED Course/ Medical Decision Making/ A&P                                 Medical Decision Making Risk OTC drugs. Prescription drug management.   [name] is here with neck pain.  History of HIV.  Overall she has no headache no fever very well-appearing.  She has had basic labs and CT scan signed prior to my evaluation.  Differential diagnosis likely muscle spasm/torticollis.  Have no concern for meningitis or infectious process.  She has no traumatic history.  She had a CBC CMP chest x-ray CT head and neck done prior to my evaluation.  She is neurologically and neurovascularly intact.  She has normal strength and sensation throughout.  She is got diffuse tenderness in the paraspinal cervical muscles on the left and left trapezius.  Pain with range of motion but only to the left.  Overall I think that this is muscular given that her CT of her head neck and x-ray of her chest are  unremarkable.  Her lab work is unremarkable.  She has no leukocytosis or anemia or electrolyte abnormality or kidney injury.  Overall she was given cocktail of Valium, Roxicodone, Toradol and Tylenol and Decadron with great improvement.  Range of motion is improving.  I will give her Flexeril Roxicodone lidocaine patch and Medrol Dosepak prescription for home.  Recommend massage and follow-up with primary care doctor.  She has a TENS unit at home that she uses for other issues that I  think could be reasonable as well.  Overall she is very well-appearing.  Have no concern for other emergent process.  Discharged in good condition.  This chart was dictated using voice recognition software.  Despite best efforts to proofread,  errors can occur which can change the documentation meaning.         Final Clinical Impression(s) / ED Diagnoses Final diagnoses:  Neck pain    Rx / DC Orders ED Discharge Orders          Ordered    methylPREDNISolone (MEDROL DOSEPAK) 4 MG TBPK tablet        01/17/23 1859    cyclobenzaprine (FLEXERIL) 10 MG tablet  2 times daily PRN        01/17/23 1859    oxyCODONE (ROXICODONE) 5 MG immediate release tablet  Every 6 hours PRN        01/17/23 1859    lidocaine (LIDODERM) 5 %  Every 24 hours        01/17/23 1859              Virgina Norfolk, DO 01/17/23 1902

## 2023-02-23 ENCOUNTER — Other Ambulatory Visit: Payer: Self-pay

## 2023-02-23 ENCOUNTER — Telehealth: Payer: Self-pay

## 2023-02-23 NOTE — Telephone Encounter (Signed)
 That could help for sure - is that a template the front/triage already has set up?

## 2023-02-23 NOTE — Telephone Encounter (Signed)
 LVM to schedule appointment for patient with Marcos Eke, NP next month (4 month follow up from last visit). Have not been able to get in touch with her for starting Cabenuva with [facility]. Last fill date for [facility] of 12/04/22.

## 2023-02-23 NOTE — Telephone Encounter (Signed)
 Letter mailed to patient to reach out to our office

## 2023-02-23 NOTE — Telephone Encounter (Signed)
 FYI - have not been able to reach her regarding initial Cabenuva injection. She hasn't filled Biktarvy in the last couple months. Recommend following with Tammy Sours before considering transition to [place] again. Thanks!

## 2023-02-23 NOTE — Telephone Encounter (Signed)
 Thanks! I know some patients we send a generic letter requesting they contact our office. Sometimes that helps with getting them to call back.  Not sure if this is something that you guys would like to try.

## 2023-03-01 ENCOUNTER — Other Ambulatory Visit: Payer: Self-pay

## 2023-03-01 NOTE — Progress Notes (Signed)
Patient never came for appointment

## 2023-03-10 ENCOUNTER — Other Ambulatory Visit: Payer: Self-pay

## 2023-03-10 ENCOUNTER — Other Ambulatory Visit: Payer: 59

## 2023-03-10 ENCOUNTER — Telehealth: Payer: Self-pay

## 2023-03-10 DIAGNOSIS — B20 Human immunodeficiency virus [HIV] disease: Secondary | ICD-10-CM

## 2023-03-10 NOTE — Addendum Note (Signed)
Addended by: Harley Alto on: 03/10/2023 01:19 PM   Modules accepted: Orders

## 2023-03-10 NOTE — Telephone Encounter (Signed)
Patient called stating that she needs a TB test for her job at [employer]. Informed patient we only check for TB through lab draw. Patient agreeable and scheduled to come in this afternoon. Lab ordered.    Leisel Pinette Lesli Albee, CMA

## 2023-03-13 ENCOUNTER — Other Ambulatory Visit (HOSPITAL_COMMUNITY): Payer: Self-pay

## 2023-03-13 LAB — QUANTIFERON-TB GOLD PLUS
Mitogen-NIL: 9.79 [IU]/mL
NIL: 0.01 [IU]/mL
QuantiFERON-TB Gold Plus: NEGATIVE
TB1-NIL: 0 [IU]/mL
TB2-NIL: 0 [IU]/mL

## 2023-03-15 ENCOUNTER — Other Ambulatory Visit: Payer: Self-pay

## 2023-03-15 ENCOUNTER — Other Ambulatory Visit (HOSPITAL_COMMUNITY): Payer: Self-pay

## 2023-03-15 NOTE — Progress Notes (Signed)
Patient refilled Biktarvy over IVR. Per Marchelle Folks ok to fill without new onboarding as she has not started [place] and has an upcoming follow up with provider. Marchelle Folks aware that we will need to speak to patient before filling. LVM and sent mychart message and we can re-enroll and fill when she calls back.

## 2023-03-31 ENCOUNTER — Ambulatory Visit: Payer: 59 | Admitting: Family

## 2023-04-08 ENCOUNTER — Ambulatory Visit: Payer: 59 | Admitting: Family

## 2023-05-06 ENCOUNTER — Other Ambulatory Visit: Payer: Self-pay

## 2023-05-06 ENCOUNTER — Other Ambulatory Visit (HOSPITAL_COMMUNITY): Payer: Self-pay

## 2023-05-06 DIAGNOSIS — B20 Human immunodeficiency virus [HIV] disease: Secondary | ICD-10-CM

## 2023-05-06 MED ORDER — BIKTARVY 50-200-25 MG PO TABS: 1.0000 | ORAL_TABLET | Freq: Every day | ORAL | 0 refills | Status: DC

## 2023-05-24 ENCOUNTER — Other Ambulatory Visit: Payer: Self-pay

## 2023-05-24 ENCOUNTER — Other Ambulatory Visit (HOSPITAL_COMMUNITY): Payer: Self-pay

## 2023-05-24 ENCOUNTER — Other Ambulatory Visit: Payer: Self-pay | Admitting: Emergency Medicine

## 2023-05-24 ENCOUNTER — Other Ambulatory Visit: Payer: Self-pay | Admitting: Family

## 2023-05-24 DIAGNOSIS — B20 Human immunodeficiency virus [HIV] disease: Secondary | ICD-10-CM

## 2023-05-24 DIAGNOSIS — I10 Essential (primary) hypertension: Secondary | ICD-10-CM

## 2023-05-24 MED ORDER — BIKTARVY 50-200-25 MG PO TABS: 1.0000 | ORAL_TABLET | Freq: Every day | ORAL | 0 refills | Status: DC

## 2023-05-26 ENCOUNTER — Other Ambulatory Visit: Payer: Self-pay | Admitting: Family

## 2023-05-26 DIAGNOSIS — B20 Human immunodeficiency virus [HIV] disease: Secondary | ICD-10-CM

## 2023-05-27 ENCOUNTER — Other Ambulatory Visit (HOSPITAL_COMMUNITY): Payer: Self-pay

## 2023-05-28 ENCOUNTER — Other Ambulatory Visit: Payer: Self-pay | Admitting: Family

## 2023-05-28 DIAGNOSIS — B20 Human immunodeficiency virus [HIV] disease: Secondary | ICD-10-CM

## 2023-05-29 ENCOUNTER — Emergency Department (HOSPITAL_COMMUNITY)
Admission: EM | Admit: 2023-05-29 | Discharge: 2023-05-29 | Disposition: A | Discharge status: Home / Self Care | Attending: Emergency Medicine | Admitting: Emergency Medicine

## 2023-05-29 ENCOUNTER — Encounter (HOSPITAL_COMMUNITY): Payer: Self-pay

## 2023-05-29 ENCOUNTER — Emergency Department (HOSPITAL_COMMUNITY)

## 2023-05-29 ENCOUNTER — Other Ambulatory Visit (HOSPITAL_COMMUNITY): Payer: Self-pay

## 2023-05-29 ENCOUNTER — Other Ambulatory Visit: Payer: Self-pay

## 2023-05-29 DIAGNOSIS — Z21 Asymptomatic human immunodeficiency virus [HIV] infection status: Secondary | ICD-10-CM | POA: Diagnosis not present

## 2023-05-29 DIAGNOSIS — S39012A Strain of muscle, fascia and tendon of lower back, initial encounter: Secondary | ICD-10-CM

## 2023-05-29 DIAGNOSIS — M545 Low back pain, unspecified: Secondary | ICD-10-CM | POA: Insufficient documentation

## 2023-05-29 LAB — CBC
HCT: 37.2 % (ref 36.0–46.0)
Hemoglobin: 12.9 g/dL (ref 12.0–15.0)
MCH: 29.5 pg (ref 26.0–34.0)
MCHC: 34.7 g/dL (ref 30.0–36.0)
MCV: 84.9 fL (ref 80.0–100.0)
Platelets: 254 10*3/uL (ref 150–400)
RBC: 4.38 MIL/uL (ref 3.87–5.11)
RDW: 12.5 % (ref 11.5–15.5)
WBC: 8.3 10*3/uL (ref 4.0–10.5)
nRBC: 0 % (ref 0.0–0.2)

## 2023-05-29 LAB — URINALYSIS, ROUTINE W REFLEX MICROSCOPIC
Bilirubin Urine: NEGATIVE
Glucose, UA: NEGATIVE mg/dL
Hgb urine dipstick: NEGATIVE
Ketones, ur: NEGATIVE mg/dL
Nitrite: NEGATIVE
Protein, ur: NEGATIVE mg/dL
Specific Gravity, Urine: 1.01 (ref 1.005–1.030)
pH: 6 (ref 5.0–8.0)

## 2023-05-29 LAB — COMPREHENSIVE METABOLIC PANEL WITH GFR
ALT: 16 U/L (ref 0–44)
AST: 14 U/L — ABNORMAL LOW (ref 15–41)
Albumin: 3.3 g/dL — ABNORMAL LOW (ref 3.5–5.0)
Alkaline Phosphatase: 63 U/L (ref 38–126)
Anion gap: 9 (ref 5–15)
BUN: 11 mg/dL (ref 6–20)
CO2: 22 mmol/L (ref 22–32)
Calcium: 8.9 mg/dL (ref 8.9–10.3)
Chloride: 105 mmol/L (ref 98–111)
Creatinine, Ser: 0.67 mg/dL (ref 0.44–1.00)
GFR, Estimated: >60 mL/min (ref >60)
Glucose, Bld: 103 mg/dL — ABNORMAL HIGH (ref 70–99)
Potassium: 3.8 mmol/L (ref 3.5–5.1)
Sodium: 136 mmol/L (ref 135–145)
Total Bilirubin: 0.6 mg/dL (ref 0.0–1.2)
Total Protein: 6.7 g/dL (ref 6.5–8.1)

## 2023-05-29 LAB — HCG, SERUM, QUALITATIVE: Preg, Serum: NEGATIVE

## 2023-05-29 LAB — LIPASE, BLOOD: Lipase: 39 U/L (ref 11–51)

## 2023-05-29 MED ORDER — KETOROLAC TROMETHAMINE 30 MG/ML IJ SOLN
30.0000 mg | Freq: Once | INTRAMUSCULAR | Status: AC
  Administered 2023-05-29: 30 mg via INTRAVENOUS
  Filled 2023-05-29: qty 1

## 2023-05-29 MED ORDER — ONDANSETRON HCL 4 MG/2ML IJ SOLN
4.0000 mg | Freq: Once | INTRAMUSCULAR | Status: AC
  Administered 2023-05-29: 4 mg via INTRAVENOUS
  Filled 2023-05-29: qty 2

## 2023-05-29 MED ORDER — METHYLPREDNISOLONE 4 MG PO TBPK: ORAL_TABLET | ORAL | 0 refills | Status: DC

## 2023-05-29 MED ORDER — FENTANYL CITRATE PF 50 MCG/ML IJ SOSY
50.0000 ug | PREFILLED_SYRINGE | Freq: Once | INTRAMUSCULAR | Status: AC
  Administered 2023-05-29: 50 ug via INTRAVENOUS
  Filled 2023-05-29: qty 1

## 2023-05-29 MED ORDER — CELECOXIB 200 MG PO CAPS: 200.0000 mg | ORAL_CAPSULE | Freq: Two times a day (BID) | ORAL | 0 refills | Status: DC

## 2023-05-29 MED ORDER — DIAZEPAM 5 MG/ML IJ SOLN
2.5000 mg | Freq: Once | INTRAMUSCULAR | Status: AC
  Administered 2023-05-29: 2.5 mg via INTRAVENOUS
  Filled 2023-05-29: qty 2

## 2023-05-29 MED ORDER — DEXAMETHASONE SODIUM PHOSPHATE 10 MG/ML IJ SOLN
10.0000 mg | Freq: Once | INTRAMUSCULAR | Status: AC
  Administered 2023-05-29: 10 mg via INTRAVENOUS
  Filled 2023-05-29: qty 1

## 2023-05-29 MED ORDER — CYCLOBENZAPRINE HCL 10 MG PO TABS: 5.0000 mg | ORAL_TABLET | Freq: Two times a day (BID) | ORAL | 0 refills | Status: DC | PRN

## 2023-05-29 NOTE — Discharge Instructions (Signed)
Contact a health care provider if: Your back pain does not improve after several weeks of treatment. Your symptoms get worse. You have a fever. Get help right away if: Your back pain is severe. You are unable to stand or walk. You develop pain in your legs. You have weakness in your buttocks or legs. You have trouble controlling when you urinate or when you have a bowel movement. You have frequent, painful, or bloody urination.

## 2023-05-29 NOTE — ED Notes (Signed)
 Patient stated her pain all started after coughing last night. Patient states her pain is in the lower back radiating down her right leg

## 2023-05-29 NOTE — ED Triage Notes (Signed)
 Pt arrived from home via [facility] c/o right flank pain 10/10 pain described as throbbing that began at 2030 on 05/28/2023.

## 2023-05-29 NOTE — ED Notes (Signed)
 Patient verbalized understanding of discharge instructions, and verbalized she will not drive when taking her muscle relaxants. Patient wheeled out to ed lobby, waiting for her daughter to pick her up.

## 2023-05-29 NOTE — ED Provider Notes (Signed)
 [facility] Provider Note   CSN: [id number] Arrival date & time: 05/29/23  4098     History  Chief Complaint  Patient presents with   Flank Pain    Catherine Simmons is a 38 y.o. female with a past medical history of obesity, HIV who presents emergency department with a chief complaint of right flank/lower back pain.  Patient reports that she was at a picnic yesterday evening with her family when she sneezed and had sudden onset set of severe pain in her right flank/lower back.  It radiates down the back of her leg.  Worse with movement and changing position.  Rates the pain as severe.  She was recently diagnosed with a urinary tract infection at the health department and was ordered antibiotics which she picked up last night but has not began taking it.  She denies fevers chills urinary urgency vaginal symptoms vomiting.Denies weakness, loss of bowel/bladder function or saddle anesthesia. Denies neck stiffness, headache, rash.  Denies fever or recent procedures to back or IVDU.    Flank Pain       Home Medications Prior to Admission medications   Medication Sig Start Date End Date Taking? Authorizing Provider  amLODipine  (NORVASC ) 10 MG tablet Take 1 tablet (10 mg total) by mouth daily. 11/12/22 04/15/23  Calone, Gregory D, FNP  bictegravir-emtricitabine -tenofovir  AF (BIKTARVY ) 50-200-25 MG TABS tablet Take 1 tablet by mouth daily. 05/24/23   Calone, Gregory D, FNP  cabotegravir  & rilpivirine  ER (CABENUVA ) 600 & 900 MG/3ML injection Inject 1 kit into the muscle every 30 (thirty) days. 12/17/22   Sonya Duster, RPH-CPP  cyclobenzaprine  (FLEXERIL ) 10 MG tablet Take 1 tablet (10 mg total) by mouth 2 (two) times daily as needed for muscle spasms. 01/17/23   Curatolo, Adam, DO  hydrochlorothiazide  (HYDRODIURIL ) 12.5 MG tablet Take 1 tablet (12.5 mg total) by mouth daily. 11/12/22   Calone, Gregory D, FNP  lidocaine  (LIDODERM ) 5 % Place 1 patch  onto the skin daily. Remove & Discard patch within 12 hours or as directed by MD 01/17/23   Lowery Rue, DO  methylPREDNISolone  (MEDROL  DOSEPAK) 4 MG TBPK tablet Follow package insert 01/17/23   Curatolo, Adam, DO  metroNIDAZOLE  (FLAGYL ) 500 MG tablet Take 1 tablet (500 mg total) by mouth 2 (two) times daily. 11/16/22   Calone, Gregory D, FNP  naproxen  (NAPROSYN ) 500 MG tablet Take 1 tablet (500 mg total) by mouth 2 (two) times daily with a meal. 07/04/22   Early Glisson, MD  norethindrone  (AYGESTIN ) 5 MG tablet TAKE 3 TABLETS(15 MG) BY MOUTH DAILY 10/28/22   Izell Marsh, MD      Allergies    Butorphanol and Stadol [butorphanol tartrate]    Review of Systems   Review of Systems  Genitourinary:  Positive for flank pain.    Physical Exam Updated Vital Signs BP (!) 164/105   Pulse 71   Temp 98.4 F (36.9 C)   Resp 15   Ht 5\' 2"  (1.575 m)   Wt 104.3 kg   LMP 05/12/2023 (Approximate)   SpO2 100%   BMI 42.06 kg/m  Physical Exam Vitals and nursing note reviewed.  Constitutional:      General: She is not in acute distress.    Appearance: She is well-developed. She is not diaphoretic.  HENT:     Head: Normocephalic and atraumatic.     Right Ear: External ear normal.     Left Ear: External ear normal.  Nose: Nose normal.     Mouth/Throat:     Mouth: Mucous membranes are moist.  Eyes:     General: No scleral icterus.    Conjunctiva/sclera: Conjunctivae normal.  Cardiovascular:     Rate and Rhythm: Normal rate and regular rhythm.     Heart sounds: Normal heart sounds. No murmur heard.    No friction rub. No gallop.  Pulmonary:     Effort: Pulmonary effort is normal. No respiratory distress.     Breath sounds: Normal breath sounds.  Abdominal:     General: Bowel sounds are normal. There is no distension.     Palpations: Abdomen is soft. There is no mass.     Tenderness: There is no abdominal tenderness. There is no guarding.  Musculoskeletal:     Cervical back:  Normal range of motion.     Comments: No midline spinal tenderness, tenderness to palpation of the lumbar paraspinal muscles, pain with passive flexion of the hip.  Relief with traction on the leg.  Normal flexion extension at the ankle, reflexes 2+ at the patellar tendon bilaterally, normal sensation and pulses.  Skin:    General: Skin is warm and dry.  Neurological:     Mental Status: She is alert and oriented to person, place, and time.  Psychiatric:        Behavior: Behavior normal.    ED Results / Procedures / Treatments   Labs (all labs ordered are listed, but only abnormal results are displayed) Labs Reviewed  COMPREHENSIVE METABOLIC PANEL WITH GFR - Abnormal; Notable for the following components:      Result Value   Glucose, Bld 103 (*)    Albumin 3.3 (*)    AST 14 (*)    All other components within normal limits  URINALYSIS, ROUTINE W REFLEX MICROSCOPIC - Abnormal; Notable for the following components:   APPearance HAZY (*)    Leukocytes,Ua MODERATE (*)    Bacteria, UA RARE (*)    All other components within normal limits  LIPASE, BLOOD  CBC  HCG, SERUM, QUALITATIVE  BASIC METABOLIC PANEL WITH GFR    EKG None  Radiology No results found.  Procedures Procedures    Medications Ordered in ED Medications  diazepam  (VALIUM ) injection 2.5 mg (has no administration in time range)  dexamethasone  (DECADRON ) injection 10 mg (has no administration in time range)  fentaNYL  (SUBLIMAZE ) injection 50 mcg (50 mcg Intravenous Given 05/29/23 0807)  ondansetron  (ZOFRAN ) injection 4 mg (4 mg Intravenous Given 05/29/23 1610)    ED Course/ Medical Decision Making/ A&P                                 Medical Decision Making Amount and/or Complexity of Data Reviewed Labs: ordered. Radiology: ordered.  Risk Prescription drug management.   This patient presents to the ED with chief complaint(s) of flank pain  with pertinent past medical history of Obesity/HIV which further  complicates the presenting complaint. The complaint involves an extensive differential diagnosis and treatment options and also carries with it a high risk of complications and morbidity.    The differential diagnosis of emergent flank pain includes, but is not limited to :Abdominal aortic aneurysm,, Renal artery embolism,Renal vein thrombosis, Aortic dissection, Mesenteric ischemia, Pyelonephritis, Renal infarction, Renal hemorrhage, Nephrolithiasis/ Renal Colic, Bladder tumor,Cystitis, Biliary colic, Pancreatitis Perforated peptic ulcer Appendicitis ,Inguinal Hernia, Diverticulitis, Bowel obstruction  Shingles Lower lobe pneumonia, Retroperitoneal hematoma/abscess/tumor, Epidural abscess, Epidural  hematoma    The initial plan is to order labs, CT and to treat patient with pain meds/ valium  for suspected musculoskeletal flank pain      Reassessment and review (also see workup area): Lab Tests: I Ordered, and personally interpreted labs.  The pertinent results include:   Labs reviewed, normal white count and hemoglobin, negative pregnancy and lipase.  CMP with mildly elevated glucose of insignificant value Imaging Studies: I ordered and independently visualized and interpreted the following imaging CT renal stone study which showed no acute findings The interpretation of the imaging was limited to assessing for emergent pathology, for which purpose it was ordered.  Medicines ordered and prescription drug management: I ordered the following medications  Medications  ketorolac  (TORADOL ) 30 MG/ML injection 30 mg (has no administration in time range)  fentaNYL  (SUBLIMAZE ) injection 50 mcg (50 mcg Intravenous Given 05/29/23 0807)  ondansetron  (ZOFRAN ) injection 4 mg (4 mg Intravenous Given 05/29/23 0806)  diazepam  (VALIUM ) injection 2.5 mg (2.5 mg Intravenous Given 05/29/23 0841)  dexamethasone  (DECADRON ) injection 10 mg (10 mg Intravenous Given 05/29/23 0841)  '  for back pain     Reevaluation of the patient after these medicines showed that the patient    minimally improved  Social Determinants of Health: Patient's lack of access to primary care increases the complexity of managing their presentation    Complexity of problems addressed: Patient's presentation is most consistent with  acute complicated illness/injury requiring diagnostic workup During patient's assessment  Disposition: After consideration of the diagnostic results and the patient's response to treatment,  I feel that the patent would benefit from discharge with symptom management.  Patient's symptoms are consistent with musculoskeletal flank pain.  She is already on treatment for urinary tract infection.  No evidence of pyelonephritis or kidney stone.  Patient has some radiation to her leg will treat for potential radiculopathy or herniated disc.  She has no red flag symptoms.  Her history of HIV complicates the workup but I have a low suspicion for epidural abscess, discitis, osteomyelitis.  Patient appears otherwise appropriate for discharge with symptomatic treatment and return precautions. .          Final Clinical Impression(s) / ED Diagnoses Final diagnoses:  None    Rx / DC Orders ED Discharge Orders     None         Tama Fails, PA-C 05/29/23 0945    Scarlette Currier, MD 05/29/23 2349

## 2023-06-03 ENCOUNTER — Other Ambulatory Visit (HOSPITAL_COMMUNITY): Payer: Self-pay

## 2023-06-11 ENCOUNTER — Other Ambulatory Visit: Payer: Self-pay | Admitting: Family

## 2023-06-11 ENCOUNTER — Other Ambulatory Visit (HOSPITAL_COMMUNITY): Payer: Self-pay

## 2023-06-11 DIAGNOSIS — I10 Essential (primary) hypertension: Secondary | ICD-10-CM

## 2023-06-14 ENCOUNTER — Other Ambulatory Visit: Payer: Self-pay

## 2023-06-14 ENCOUNTER — Other Ambulatory Visit (HOSPITAL_COMMUNITY): Payer: Self-pay

## 2023-06-14 MED ORDER — AMLODIPINE BESYLATE 10 MG PO TABS
10.0000 mg | ORAL_TABLET | Freq: Every day | ORAL | 0 refills | Status: DC
  Filled 2023-06-14: qty 30, 30d supply, fill #0

## 2023-06-14 NOTE — Telephone Encounter (Signed)
 Okay to refill? Pt has only filled 3 times in the last 7 months.

## 2023-06-22 ENCOUNTER — Ambulatory Visit: Admitting: Family

## 2023-07-08 ENCOUNTER — Other Ambulatory Visit: Payer: Self-pay | Admitting: Family

## 2023-07-08 ENCOUNTER — Other Ambulatory Visit: Payer: Self-pay

## 2023-07-08 ENCOUNTER — Other Ambulatory Visit (HOSPITAL_COMMUNITY): Payer: Self-pay

## 2023-07-08 ENCOUNTER — Encounter: Payer: Self-pay | Admitting: Obstetrics and Gynecology

## 2023-07-08 DIAGNOSIS — I10 Essential (primary) hypertension: Secondary | ICD-10-CM

## 2023-07-08 MED ORDER — AMLODIPINE BESYLATE 10 MG PO TABS
10.0000 mg | ORAL_TABLET | Freq: Every day | ORAL | 0 refills | Status: DC
Start: 2023-07-08 — End: 2023-09-07
  Filled 2023-07-08: qty 30, 30d supply, fill #0

## 2023-07-09 NOTE — Telephone Encounter (Signed)
 Patient scheduled for sooner appt

## 2023-07-12 ENCOUNTER — Ambulatory Visit (INDEPENDENT_AMBULATORY_CARE_PROVIDER_SITE_OTHER): Admitting: Infectious Disease

## 2023-07-12 ENCOUNTER — Encounter: Payer: Self-pay | Admitting: Infectious Disease

## 2023-07-12 ENCOUNTER — Other Ambulatory Visit: Payer: Self-pay

## 2023-07-12 ENCOUNTER — Other Ambulatory Visit (HOSPITAL_COMMUNITY)
Admission: RE | Admit: 2023-07-12 | Discharge: 2023-07-12 | Disposition: A | Discharge status: Home / Self Care | Source: Ambulatory Visit | Attending: Infectious Disease | Admitting: Infectious Disease

## 2023-07-12 VITALS — BP 133/88 | HR 66 | Resp 16 | Ht 62.0 in | Wt 229.8 lb

## 2023-07-12 DIAGNOSIS — N939 Abnormal uterine and vaginal bleeding, unspecified: Secondary | ICD-10-CM

## 2023-07-12 DIAGNOSIS — I10 Essential (primary) hypertension: Secondary | ICD-10-CM

## 2023-07-12 DIAGNOSIS — B20 Human immunodeficiency virus [HIV] disease: Secondary | ICD-10-CM | POA: Diagnosis present

## 2023-07-12 MED ORDER — BIKTARVY 50-200-25 MG PO TABS
1.0000 | ORAL_TABLET | Freq: Every day | ORAL | 11 refills | Status: AC
  Filled 2023-07-13: qty 30, 30d supply, fill #0
  Filled 2023-08-06 – 2023-08-09 (×2): qty 30, 30d supply, fill #1
  Filled 2023-09-03: qty 30, 30d supply, fill #2
  Filled 2023-10-04: qty 30, 30d supply, fill #3
  Filled 2023-11-02: qty 30, 30d supply, fill #4
  Filled 2023-11-30: qty 30, 30d supply, fill #5

## 2023-07-12 NOTE — Progress Notes (Signed)
 Subjective:  Chief complaint: follow-up for HIV disease on medications   Patient ID: Catherine Simmons, female    DOB: 23-Aug-1985, 38 y.o.   MRN: [id number]  HPI   Past Medical History:  Diagnosis Date   Anemia    Cholecystitis 07/16/2010   Depression    Genital warts 2004   Headache    Hemorrhoids    HIV (human immunodeficiency virus infection) (HCC)    Hx of pelvic inflammatory disease 08/31/2013   And hx of multiple STDs    Hypertension    Influenza A 01/17/2021   Ovarian cyst    Pregnancy induced hypertension    Sickle cell trait (HCC)    UTI (urinary tract infection)     Past Surgical History:  Procedure Laterality Date   CHOLECYSTECTOMY  07/19/2010   DILATION AND EVACUATION N/A 01/30/2021   Procedure: DILATATION AND EVACUATION;  Surgeon: Verlyn Goad, MD;  Location: [facility] OR;  Service: Gynecology;  Laterality: N/A;   EXTERNAL CEPHALIC VERSION  12/11/2016       HYSTEROSCOPY WITH D & C N/A 05/06/2022   Procedure: DILATATION AND CURETTAGE /HYSTEROSCOPY;  Surgeon: Izell Marsh, MD;  Location: [facility];  Service: Gynecology;  Laterality: N/A;   INTRAUTERINE DEVICE (IUD) INSERTION N/A 05/06/2022   Procedure: INTRAUTERINE DEVICE (IUD) INSERTION;  Surgeon: Izell Marsh, MD;  Location: [facility];  Service: Gynecology;  Laterality: N/A;   SHOULDER SURGERY  12/11/2021   WISDOM TOOTH EXTRACTION      Family History  Problem Relation Age of Onset   Cancer Mother    Hypertension Mother    Diabetes Mother    Pancreatitis Mother    Hypertension Father    Diabetes Maternal Aunt       Social History   Socioeconomic History   Marital status: Single    Spouse name: Not on file   Number of children: Not on file   Years of education: Not on file   Highest education level: Not on file  Occupational History   Not on file  Tobacco Use   Smoking status: Former    Current packs/day: 0.00    Types: Cigarettes    Start date:  03/11/2012    Quit date: 09/2020    Years since quitting: 2.8    Passive exposure: Never   Smokeless tobacco: Never  Vaping Use   Vaping status: Never Used  Substance and Sexual Activity   Alcohol use: Yes    Comment: occ   Drug use: Yes    Frequency: 5.0 times per week    Types: Marijuana   Sexual activity: Yes    Partners: Male    Birth control/protection: None, I.U.D.    Comment: declined condoms  Other Topics Concern   Not on file  Social History Narrative   Not on file   Social Drivers of Health   Financial Resource Strain: Not on file  Food Insecurity: Food Insecurity Present (02/20/2021)   Hunger Vital Sign    Worried About Running Out of Food in the Last Year: Never true    Ran Out of Food in the Last Year: Sometimes true  Transportation Needs: No Transportation Needs (02/20/2021)   PRAPARE - Administrator, Civil Service (Medical): No    Lack of Transportation (Non-Medical): No  Physical Activity: Not on file  Stress: Not on file  Social Connections: Unknown (07/29/2021)   Received from [facility], [facility]   Social Network  Social Network: Not on file    Allergies  Allergen Reactions   Butorphanol Itching    Tolerates percocet   Stadol [Butorphanol Tartrate] Itching    Tolerates percocet     Current Outpatient Medications:    amLODipine  (NORVASC ) 10 MG tablet, Take 1 tablet (10 mg total) by mouth daily., Disp: 30 tablet, Rfl: 0   bictegravir-emtricitabine -tenofovir  AF (BIKTARVY ) 50-200-25 MG TABS tablet, Take 1 tablet by mouth daily., Disp: 30 tablet, Rfl: 11   celecoxib  (CELEBREX ) 200 MG capsule, Take 1 capsule (200 mg total) by mouth 2 (two) times daily., Disp: 20 capsule, Rfl: 0   cyclobenzaprine  (FLEXERIL ) 10 MG tablet, Take 1 tablet (10 mg total) by mouth 2 (two) times daily as needed for muscle spasms., Disp: 20 tablet, Rfl: 0   cyclobenzaprine  (FLEXERIL ) 10 MG tablet, Take 0.5-1 tablets (5-10 mg total) by mouth 2 (two) times  daily as needed for muscle spasms., Disp: 20 tablet, Rfl: 0   hydrochlorothiazide  (HYDRODIURIL ) 12.5 MG tablet, Take 1 tablet (12.5 mg total) by mouth daily., Disp: 30 tablet, Rfl: 4   cabotegravir  & rilpivirine  ER (CABENUVA ) 600 & 900 MG/3ML injection, Inject 1 kit into the muscle every 30 (thirty) days. (Patient not taking: Reported on 07/12/2023), Disp: 6 mL, Rfl: 1   lidocaine  (LIDODERM ) 5 %, Place 1 patch onto the skin daily. Remove & Discard patch within 12 hours or as directed by MD (Patient not taking: Reported on 07/12/2023), Disp: 30 patch, Rfl: 0   methylPREDNISolone  (MEDROL  DOSEPAK) 4 MG TBPK tablet, Follow package insert (Patient not taking: Reported on 07/12/2023), Disp: 21 each, Rfl: 0   methylPREDNISolone  (MEDROL  DOSEPAK) 4 MG TBPK tablet, Use as directed (Patient not taking: Reported on 07/12/2023), Disp: 21 tablet, Rfl: 0   metroNIDAZOLE  (FLAGYL ) 500 MG tablet, Take 1 tablet (500 mg total) by mouth 2 (two) times daily. (Patient not taking: Reported on 07/12/2023), Disp: 14 tablet, Rfl: 0   naproxen  (NAPROSYN ) 500 MG tablet, Take 1 tablet (500 mg total) by mouth 2 (two) times daily with a meal. (Patient not taking: Reported on 07/12/2023), Disp: 30 tablet, Rfl: 0   norethindrone  (AYGESTIN ) 5 MG tablet, TAKE 3 TABLETS(15 MG) BY MOUTH DAILY (Patient not taking: Reported on 07/12/2023), Disp: 90 tablet, Rfl: 2   Review of Systems  Constitutional:  Negative for activity change, appetite change, chills, diaphoresis, fatigue, fever and unexpected weight change.  HENT:  Negative for congestion, rhinorrhea, sinus pressure, sneezing, sore throat and trouble swallowing.   Eyes:  Negative for photophobia and visual disturbance.  Respiratory:  Negative for cough, chest tightness, shortness of breath, wheezing and stridor.   Cardiovascular:  Negative for chest pain, palpitations and leg swelling.  Gastrointestinal:  Negative for abdominal distention, abdominal pain, anal bleeding, blood in stool,  constipation, diarrhea, nausea and vomiting.  Genitourinary:  Negative for difficulty urinating, dysuria, flank pain and hematuria.  Musculoskeletal:  Negative for arthralgias, back pain, gait problem, joint swelling and myalgias.  Skin:  Negative for color change, pallor, rash and wound.  Neurological:  Negative for dizziness, tremors, weakness and light-headedness.  Hematological:  Negative for adenopathy. Does not bruise/bleed easily.  Psychiatric/Behavioral:  Negative for agitation, behavioral problems, confusion, decreased concentration, dysphoric mood and sleep disturbance.        Objective:   Physical Exam Constitutional:      General: She is not in acute distress.    Appearance: Normal appearance. She is well-developed. She is not ill-appearing or diaphoretic.  HENT:  Head: Normocephalic and atraumatic.     Right Ear: Hearing and external ear normal.     Left Ear: Hearing and external ear normal.     Nose: No nasal deformity or rhinorrhea.  Eyes:     General: No scleral icterus.    Conjunctiva/sclera: Conjunctivae normal.     Right eye: Right conjunctiva is not injected.     Left eye: Left conjunctiva is not injected.     Pupils: Pupils are equal, round, and reactive to light.  Neck:     Vascular: No JVD.  Cardiovascular:     Rate and Rhythm: Normal rate and regular rhythm.     Heart sounds: Normal heart sounds, S1 normal and S2 normal.  Pulmonary:     Effort: Pulmonary effort is normal. No respiratory distress.     Breath sounds: No wheezing.  Abdominal:     General: Bowel sounds are normal. There is no distension.     Palpations: Abdomen is soft.     Tenderness: There is no abdominal tenderness.  Musculoskeletal:        General: Normal range of motion.     Right shoulder: Normal.     Left shoulder: Normal.     Cervical back: Normal range of motion and neck supple.     Right hip: Normal.     Left hip: Normal.     Right knee: Normal.     Left knee: Normal.   Lymphadenopathy:     Head:     Right side of head: No submandibular, preauricular or posterior auricular adenopathy.     Left side of head: No submandibular, preauricular or posterior auricular adenopathy.     Cervical: No cervical adenopathy.     Right cervical: No superficial or deep cervical adenopathy.    Left cervical: No superficial or deep cervical adenopathy.  Skin:    General: Skin is warm and dry.     Coloration: Skin is not pale.     Findings: No abrasion, bruising, ecchymosis, erythema, lesion or rash.     Nails: There is no clubbing.  Neurological:     Mental Status: She is alert and oriented to person, place, and time.     Sensory: No sensory deficit.     Coordination: Coordination normal.     Gait: Gait normal.  Psychiatric:        Attention and Perception: She is attentive.        Mood and Affect: Mood normal.        Speech: Speech normal.        Behavior: Behavior normal. Behavior is cooperative.        Thought Content: Thought content normal.        Judgment: Judgment normal.           Assessment & Plan:   Assessment and Plan    HIV infection Managed with Biktarvy . Irregular menstrual bleeding noted with inconsistent adherence. Interested in switching to Cabenuva , pending lab results. Discussed Cabenuva  benefits and risks, emphasizing timely injections. - Check labs to assess viral load control. - Consider [facility] study enrollment if viral load is not well controlled.--she has participated in [facility] pregnancy study we did with [employer] years ago and would be interested - Initiate Cabenuva  therapy if viral load is controlled and insurance covers. - Ensure she signs a contract for Cabenuva  therapy, explaining adherence importance and risks. - in this scenario Schedule Cabenuva  injections every two months.  Hypertension Managed with amlodipine  and hydrochlorothiazide .  General Health Maintenance Discussed potential need for vaccines. No immediate vaccinations  required. - Check vaccination status and update as necessary.

## 2023-07-13 ENCOUNTER — Other Ambulatory Visit: Payer: Self-pay

## 2023-07-13 ENCOUNTER — Other Ambulatory Visit: Payer: Self-pay | Admitting: Pharmacist

## 2023-07-13 ENCOUNTER — Other Ambulatory Visit (HOSPITAL_COMMUNITY): Payer: Self-pay

## 2023-07-13 LAB — T-HELPER CELLS (CD4) COUNT (NOT AT ARMC)
CD4 % Helper T Cell: 50 % (ref 33–65)
CD4 T Cell Abs: 1125 /uL (ref 400–1790)

## 2023-07-13 LAB — URINE CYTOLOGY ANCILLARY ONLY
Chlamydia: NEGATIVE
Comment: NEGATIVE
Comment: NORMAL
Neisseria Gonorrhea: NEGATIVE

## 2023-07-13 NOTE — Progress Notes (Signed)
 Specialty Pharmacy Initial Fill Coordination Note  Catherine Simmons is a 38 y.o. female contacted today regarding initial fill of specialty medication(s) Bictegravir-Emtricitab-Tenofov (Biktarvy )   Patient requested Delivery   Delivery date: 07/15/23   Verified address: [address] [place] [address]   Medication will be filled on 07/14/23.   Patient is aware of $0 copayment.

## 2023-07-13 NOTE — Progress Notes (Signed)
 Specialty Pharmacy Initiation Note   Catherine Simmons is a 38 y.o. female who will be followed by the specialty pharmacy service for RxSp HIV    Review of administration, indication, effectiveness, safety, potential side effects, storage/disposable, and missed dose instructions occurred today for patient's specialty medication(s) Bictegravir-Emtricitab-Tenofov (Biktarvy )     Patient/Caregiver did not have any additional questions or concerns.   Patient's therapy is appropriate to: Continue    Goals Addressed             This Visit's Progress    Achieve Undetectable HIV Viral Load < 20   On track    Patient is initiating therapy. Patient will maintain adherence.      Comply with lab assessments   On track    Patient is initiating therapy. Patient will adhere to provider and/or lab appointments.      Maintain optimal adherence to therapy   Improving    Patient is initiating therapy. Patient will work on increased adherence.         Sonya Duster Specialty Pharmacist

## 2023-07-14 ENCOUNTER — Other Ambulatory Visit (HOSPITAL_COMMUNITY): Payer: Self-pay

## 2023-07-14 ENCOUNTER — Other Ambulatory Visit: Payer: Self-pay

## 2023-07-14 LAB — COMPLETE METABOLIC PANEL WITHOUT GFR
AG Ratio: 1.6 (calc) (ref 1.0–2.5)
ALT: 13 U/L (ref 6–29)
AST: 11 U/L (ref 10–30)
Albumin: 4 g/dL (ref 3.6–5.1)
Alkaline phosphatase (APISO): 76 U/L (ref 31–125)
BUN: 15 mg/dL (ref 7–25)
CO2: 23 mmol/L (ref 20–32)
Calcium: 9.2 mg/dL (ref 8.6–10.2)
Chloride: 106 mmol/L (ref 98–110)
Creat: 0.69 mg/dL (ref 0.50–0.97)
Globulin: 2.5 g/dL (ref 1.9–3.7)
Glucose, Bld: 91 mg/dL (ref 65–99)
Potassium: 4 mmol/L (ref 3.5–5.3)
Sodium: 137 mmol/L (ref 135–146)
Total Bilirubin: 0.6 mg/dL (ref 0.2–1.2)
Total Protein: 6.5 g/dL (ref 6.1–8.1)

## 2023-07-14 LAB — CBC WITH DIFFERENTIAL/PLATELET
Absolute Lymphocytes: 2427 {cells}/uL (ref 850–3900)
Absolute Monocytes: 472 {cells}/uL (ref 200–950)
Basophils Absolute: 21 {cells}/uL (ref 0–200)
Basophils Relative: 0.4 %
Eosinophils Absolute: 80 {cells}/uL (ref 15–500)
Eosinophils Relative: 1.5 %
HCT: 40 % (ref 35.0–45.0)
Hemoglobin: 13.1 g/dL (ref 11.7–15.5)
MCH: 29.4 pg (ref 27.0–33.0)
MCHC: 32.8 g/dL (ref 32.0–36.0)
MCV: 89.7 fL (ref 80.0–100.0)
MPV: 10.1 fL (ref 7.5–12.5)
Monocytes Relative: 8.9 %
Neutro Abs: 2300 {cells}/uL (ref 1500–7800)
Neutrophils Relative %: 43.4 %
Platelets: 244 10*3/uL (ref 140–400)
RBC: 4.46 10*6/uL (ref 3.80–5.10)
RDW: 12.1 % (ref 11.0–15.0)
Total Lymphocyte: 45.8 %
WBC: 5.3 10*3/uL (ref 3.8–10.8)

## 2023-07-14 LAB — LIPID PANEL
Cholesterol: 202 mg/dL — ABNORMAL HIGH (ref <200)
HDL: 64 mg/dL (ref >=50)
LDL Cholesterol (Calc): 119 mg/dL — ABNORMAL HIGH
Non-HDL Cholesterol (Calc): 138 mg/dL — ABNORMAL HIGH (ref <130)
Total CHOL/HDL Ratio: 3.2 (calc) (ref <5.0)
Triglycerides: 87 mg/dL (ref <150)

## 2023-07-14 LAB — HIV-1 RNA QUANT-NO REFLEX-BLD
HIV 1 RNA Quant: NOT DETECTED {copies}/mL
HIV-1 RNA Quant, Log: NOT DETECTED {Log_copies}/mL

## 2023-07-14 LAB — RPR: RPR Ser Ql: NONREACTIVE

## 2023-07-15 ENCOUNTER — Encounter: Payer: Self-pay | Admitting: Pharmacist

## 2023-07-20 ENCOUNTER — Ambulatory Visit: Admitting: Family

## 2023-07-22 ENCOUNTER — Telehealth: Payer: Self-pay

## 2023-07-22 NOTE — Telephone Encounter (Signed)
 Attempted to speak to patient regarding interest in Cabenuva  for treatment of HIV. Unable to reach her. Left voicemail with clinic call back number.   Tolu Yurem Viner, PharmD [facility] PGY-1

## 2023-07-27 ENCOUNTER — Ambulatory Visit: Payer: Self-pay | Admitting: Pharmacist

## 2023-07-27 NOTE — Progress Notes (Signed)
 We have called her multiple times and sent her a MyChart message with no luck. Will leave responsibility on her to reach back out to us . Based on this lack of communication and history of some missed appointments, I do not think she would be the most reliable patient for Cabenuva . Thanks!

## 2023-08-06 ENCOUNTER — Other Ambulatory Visit: Payer: Self-pay

## 2023-08-09 ENCOUNTER — Other Ambulatory Visit: Payer: Self-pay

## 2023-08-09 NOTE — Progress Notes (Signed)
 Specialty Pharmacy Refill Coordination Note  Catherine Simmons is a 38 y.o. female contacted today regarding refills of specialty medication(s) Bictegravir-Emtricitab-Tenofov (Biktarvy )   Patient requested Delivery   Delivery date: 08/10/23   Verified address: [address] [place] [address]   Medication will be filled on 06.30.25.

## 2023-08-25 ENCOUNTER — Telehealth: Payer: Self-pay | Admitting: Family

## 2023-08-25 NOTE — Telephone Encounter (Signed)
 Catherine Simmons called inquiring about being in a study and getting an injection. I asked if it was regarding [place]  but pt was unsure. She can be reached at [phone].

## 2023-09-03 ENCOUNTER — Other Ambulatory Visit: Payer: Self-pay

## 2023-09-07 ENCOUNTER — Other Ambulatory Visit: Payer: Self-pay

## 2023-09-07 ENCOUNTER — Other Ambulatory Visit: Payer: Self-pay | Admitting: Family

## 2023-09-07 ENCOUNTER — Other Ambulatory Visit (HOSPITAL_COMMUNITY): Payer: Self-pay

## 2023-09-07 DIAGNOSIS — I10 Essential (primary) hypertension: Secondary | ICD-10-CM

## 2023-09-07 MED ORDER — HYDROCHLOROTHIAZIDE 12.5 MG PO TABS
12.5000 mg | ORAL_TABLET | Freq: Every day | ORAL | 4 refills | Status: AC
  Filled 2023-09-07: qty 30, 30d supply, fill #0
  Filled 2023-10-31: qty 30, 30d supply, fill #1
  Filled 2024-02-07: qty 30, 30d supply, fill #2

## 2023-09-07 MED ORDER — AMLODIPINE BESYLATE 10 MG PO TABS
10.0000 mg | ORAL_TABLET | Freq: Every day | ORAL | 0 refills | Status: DC
  Filled 2023-09-07: qty 30, 30d supply, fill #0

## 2023-09-07 NOTE — Progress Notes (Signed)
 Specialty Pharmacy Refill Coordination Note  Catherine Simmons is a 38 y.o. female contacted today regarding refills of specialty medication(s) Bictegravir-Emtricitab-Tenofov (Biktarvy )   Patient requested Delivery   Delivery date: 09/08/23   Verified address: [address] [place] [address]   Medication will be filled on 09/07/23.

## 2023-10-04 ENCOUNTER — Other Ambulatory Visit: Payer: Self-pay

## 2023-10-06 ENCOUNTER — Other Ambulatory Visit: Payer: Self-pay

## 2023-10-06 NOTE — Progress Notes (Signed)
 Specialty Pharmacy Refill Coordination Note  Catherine Simmons is a 38 y.o. female contacted today regarding refills of specialty medication(s) Bictegravir-Emtricitab-Tenofov (Biktarvy )   Patient requested Delivery   Delivery date: 10/08/23   Verified address: [address] [place] [address]   Medication will be filled on 10/07/23.

## 2023-10-31 ENCOUNTER — Other Ambulatory Visit (HOSPITAL_COMMUNITY): Payer: Self-pay

## 2023-10-31 ENCOUNTER — Other Ambulatory Visit: Payer: Self-pay | Admitting: Family

## 2023-10-31 DIAGNOSIS — I10 Essential (primary) hypertension: Secondary | ICD-10-CM

## 2023-11-01 ENCOUNTER — Other Ambulatory Visit (HOSPITAL_COMMUNITY): Payer: Self-pay

## 2023-11-01 ENCOUNTER — Other Ambulatory Visit: Payer: Self-pay

## 2023-11-01 MED ORDER — AMLODIPINE BESYLATE 10 MG PO TABS
10.0000 mg | ORAL_TABLET | Freq: Every day | ORAL | 2 refills | Status: AC
  Filled 2023-11-01: qty 30, 30d supply, fill #0
  Filled 2024-02-07: qty 30, 30d supply, fill #1

## 2023-11-01 NOTE — Telephone Encounter (Signed)
 Patient should be taking with hydrochlorothiazide , sending in 3 refills to match that supply.  Radley Barto, BSN, RN

## 2023-11-02 ENCOUNTER — Other Ambulatory Visit: Payer: Self-pay

## 2023-11-02 ENCOUNTER — Other Ambulatory Visit (HOSPITAL_COMMUNITY): Payer: Self-pay

## 2023-11-02 NOTE — Progress Notes (Signed)
 Specialty Pharmacy Refill Coordination Note  Spoke with Catherine Simmons is a 38 y.o. female contacted today regarding refills of specialty medication(s) Bictegravir-Emtricitab-Tenofov (Biktarvy )  Doses on hand: 6  Patient requested: Delivery   Delivery date: 11/05/23   Verified address: [address] [place] [address]  Medication will be filled on 11/04/23.

## 2023-11-03 ENCOUNTER — Other Ambulatory Visit: Payer: Self-pay

## 2023-11-04 ENCOUNTER — Other Ambulatory Visit: Payer: Self-pay

## 2023-11-30 ENCOUNTER — Other Ambulatory Visit: Payer: Self-pay

## 2023-12-02 ENCOUNTER — Other Ambulatory Visit (HOSPITAL_COMMUNITY): Payer: Self-pay

## 2024-02-04 ENCOUNTER — Encounter (HOSPITAL_COMMUNITY): Payer: Self-pay

## 2024-02-04 ENCOUNTER — Other Ambulatory Visit: Payer: Self-pay

## 2024-02-04 ENCOUNTER — Emergency Department (HOSPITAL_COMMUNITY)
Admission: EM | Admit: 2024-02-04 | Discharge: 2024-02-05 | Disposition: A | Discharge status: Home / Self Care | Attending: Emergency Medicine | Admitting: Emergency Medicine

## 2024-02-04 ENCOUNTER — Emergency Department (HOSPITAL_COMMUNITY)

## 2024-02-04 DIAGNOSIS — I10 Essential (primary) hypertension: Secondary | ICD-10-CM | POA: Insufficient documentation

## 2024-02-04 DIAGNOSIS — M25561 Pain in right knee: Secondary | ICD-10-CM | POA: Insufficient documentation

## 2024-02-04 DIAGNOSIS — Z87891 Personal history of nicotine dependence: Secondary | ICD-10-CM | POA: Insufficient documentation

## 2024-02-04 DIAGNOSIS — Z21 Asymptomatic human immunodeficiency virus [HIV] infection status: Secondary | ICD-10-CM | POA: Insufficient documentation

## 2024-02-04 NOTE — ED Triage Notes (Signed)
 Patient reports R knee pain and swelling

## 2024-02-04 NOTE — ED Triage Notes (Signed)
 PT complains of right knee pain x 1 month after tripping and falling on rocks. Pt states has not been able to fully straighten leg since happening. Pain is worse today

## 2024-02-05 MED ORDER — NAPROXEN 500 MG PO TABS: 500.0000 mg | ORAL_TABLET | Freq: Two times a day (BID) | ORAL | 0 refills | Status: AC

## 2024-02-05 NOTE — ED Provider Notes (Signed)
 " [facility] Emergency Department Provider Note MRN:  [id number]  Arrival date & time: 02/05/2024     Chief Complaint   Knee Pain   History of Present Illness   Catherine Simmons is a 38 y.o. year-old female with a history of HIV presenting to the ED with chief complaint of knee pain.  Persistent knee pain for the past month.  Walked on uneven ground and felt immediate sharp pain to the right knee with a pop.  Having continued pain since then with any ambulation, increased pain with full flexion or extension.  Has tried rest, Tylenol , Motrin , not going away.  Review of Systems  A thorough review of systems was obtained and all systems are negative except as noted in the HPI and PMH.   Patient's Health History    Past Medical History:  Diagnosis Date   Anemia    Cholecystitis 07/16/2010   Depression    Genital warts 2004   Headache    Hemorrhoids    HIV (human immunodeficiency virus infection) (HCC)    Hx of pelvic inflammatory disease 08/31/2013   And hx of multiple STDs    Hypertension    Influenza A 01/17/2021   Ovarian cyst    Pregnancy induced hypertension    Sickle cell trait    UTI (urinary tract infection)     Past Surgical History:  Procedure Laterality Date   CHOLECYSTECTOMY  07/19/2010   DILATION AND EVACUATION N/A 01/30/2021   Procedure: DILATATION AND EVACUATION;  Surgeon: Alger Gong, MD;  Location: [facility];  Service: Gynecology;  Laterality: N/A;   EXTERNAL CEPHALIC VERSION  12/11/2016       HYSTEROSCOPY WITH D & C N/A 05/06/2022   Procedure: DILATATION AND CURETTAGE /HYSTEROSCOPY;  Surgeon: Erik Kieth BROCKS, MD;  Location: [facility];  Service: Gynecology;  Laterality: N/A;   INTRAUTERINE DEVICE (IUD) INSERTION N/A 05/06/2022   Procedure: INTRAUTERINE DEVICE (IUD) INSERTION;  Surgeon: Erik Kieth BROCKS, MD;  Location: [facility];  Service: Gynecology;  Laterality: N/A;   SHOULDER SURGERY   12/11/2021   WISDOM TOOTH EXTRACTION      Family History  Problem Relation Age of Onset   Cancer Mother    Hypertension Mother    Diabetes Mother    Pancreatitis Mother    Hypertension Father    Diabetes Maternal Aunt     Social History   Socioeconomic History   Marital status: Single    Spouse name: Not on file   Number of children: Not on file   Years of education: Not on file   Highest education level: Not on file  Occupational History   Not on file  Tobacco Use   Smoking status: Former    Current packs/day: 0.00    Types: Cigarettes    Start date: 03/11/2012    Quit date: 09/2020    Years since quitting: 3.4    Passive exposure: Never   Smokeless tobacco: Never  Vaping Use   Vaping status: Never Used  Substance and Sexual Activity   Alcohol use: Yes    Comment: occ   Drug use: Yes    Frequency: 5.0 times per week    Types: Marijuana   Sexual activity: Yes    Partners: Male    Birth control/protection: None, I.U.D.    Comment: declined condoms  Other Topics Concern   Not on file  Social History Narrative   Not on file   Social Drivers of  Health   Tobacco Use: Medium Risk (07/12/2023)   Patient History    Smoking Tobacco Use: Former    Smokeless Tobacco Use: Never    Passive Exposure: Never  Physicist, Medical Strain: Not on file  Food Insecurity: Food Insecurity Present (02/20/2021)   Hunger Vital Sign    Worried About Running Out of Food in the Last Year: Never true    Ran Out of Food in the Last Year: Sometimes true  Transportation Needs: No Transportation Needs (02/20/2021)   PRAPARE - Administrator, Civil Service (Medical): No    Lack of Transportation (Non-Medical): No  Physical Activity: Not on file  Stress: Not on file  Social Connections: Unknown (07/10/2021)   Received from [facility]   Social Network    Social Network: Not on file  Intimate Partner Violence: Unknown (07/10/2021)   Received from [facility]   HITS    Scream  or Curse: Not on file    Threaten Physical Harm: Not on file    Insult or Talk Down To: Not on file    Physically Hurt: Not on file  Depression (PHQ2-9): Medium Risk (07/12/2023)   Depression (PHQ2-9)    PHQ-2 Score: 6  Alcohol Screen: Not on file  Housing: Not on file  Utilities: Not on file  Health Literacy: Not on file     Physical Exam   Vitals:   02/05/24 0209 02/05/24 0213  BP: (!) 186/102 (!) 158/109  Pulse: 60 71  Resp: 16 16  Temp: 98.3 F (36.8 C) 97.6 F (36.4 C)  SpO2: 100% 100%    CONSTITUTIONAL: Well-appearing, NAD NEURO/PSYCH:  Alert and oriented x 3, no focal deficits EYES:  eyes equal and reactive ENT/NECK:  no LAD, no JVD CARDIO: Regular rate, well-perfused, normal S1 and S2 PULM:  CTAB no wheezing or rhonchi GI/GU:  non-distended, non-tender MSK/SPINE:  No gross deformities, no edema SKIN:  no rash, atraumatic   *Additional and/or pertinent findings included in MDM below  Diagnostic and Interventional Summary    EKG Interpretation Date/Time:    Ventricular Rate:    PR Interval:    QRS Duration:    QT Interval:    QTC Calculation:   R Axis:      Text Interpretation:         Labs Reviewed - No data to display  DG Knee Complete 4 Views Right  Final Result      Medications - No data to display   Procedures  /  Critical Care Procedures  ED Course and Medical Decision Making  Initial Impression and Ddx Limb is neurovascularly intact, range of motion is largely intact.  She hurts the most medially, she is pointing directly at her meniscus area and so that is a concern.  Fracture is a possibility but felt to be less likely.  Could also be ligamentous injury, ACL or PCL.  No other complaints.  Past medical/surgical history that increases complexity of ED encounter: HIV  Interpretation of Diagnostics I personally reviewed the knee x-ray and my interpretation is as follows: No fracture    Patient Reassessment and Ultimate  Disposition/Management     Plan is for discharge with orthopedic follow-up.  Patient management required discussion with the following services or consulting groups:  None  Complexity of Problems Addressed Acute complicated illness or Injury  Additional Data Reviewed and Analyzed Further history obtained from: Further history from spouse/family member  Additional Factors Impacting ED Encounter Risk Prescriptions  Ozell  EMERSON Poisson, MD [facility] Emergency Medicine [facility] [email or web address]  Final Clinical Impressions(s) / ED Diagnoses     ICD-10-CM   1. Acute pain of right knee  M25.561       ED Discharge Orders          Ordered    naproxen  (NAPROSYN ) 500 MG tablet  2 times daily        02/05/24 0246             Discharge Instructions Discussed with and Provided to Patient:    Discharge Instructions      You were evaluated in the Emergency Department and after careful evaluation, we did not find any emergent condition requiring admission or further testing in the hospital.  Your exam/testing today is overall reassuring.  Symptoms may be due to injury to the knee ligaments or meniscus.  Recommend follow-up with orthopedic surgery for future management.  Take the Naprosyn  anti-inflammatory twice daily for pain.  Try to rest the knee, use the brace.  Call the number provided to set up an appointment.  Please return to the Emergency Department if you experience any worsening of your condition.   Thank you for allowing us  to be a part of your care.      Poisson Ozell HERO, MD 02/05/24 [phone]  "

## 2024-02-05 NOTE — Discharge Instructions (Addendum)
 You were evaluated in the Emergency Department and after careful evaluation, we did not find any emergent condition requiring admission or further testing in the hospital.  Your exam/testing today is overall reassuring.  Symptoms may be due to injury to the knee ligaments or meniscus.  Recommend follow-up with orthopedic surgery for future management.  Take the Naprosyn  anti-inflammatory twice daily for pain.  Try to rest the knee, use the brace.  Call the number provided to set up an appointment.  Please return to the Emergency Department if you experience any worsening of your condition.   Thank you for allowing us  to be a part of your care.

## 2024-02-07 ENCOUNTER — Other Ambulatory Visit: Payer: Self-pay

## 2024-02-07 ENCOUNTER — Other Ambulatory Visit (HOSPITAL_COMMUNITY): Payer: Self-pay

## 2024-02-17 ENCOUNTER — Ambulatory Visit: Payer: Self-pay | Admitting: Infectious Diseases

## 2024-03-01 ENCOUNTER — Ambulatory Visit: Admitting: Infectious Diseases

## 2024-03-10 ENCOUNTER — Emergency Department (HOSPITAL_COMMUNITY)

## 2024-03-10 ENCOUNTER — Emergency Department (HOSPITAL_COMMUNITY)
Admission: EM | Admit: 2024-03-10 | Discharge: 2024-03-11 | Disposition: A | Attending: Emergency Medicine | Admitting: Emergency Medicine

## 2024-03-10 ENCOUNTER — Encounter (HOSPITAL_COMMUNITY): Payer: Self-pay

## 2024-03-10 ENCOUNTER — Other Ambulatory Visit: Payer: Self-pay

## 2024-03-10 DIAGNOSIS — G2582 Stiff-man syndrome: Secondary | ICD-10-CM | POA: Insufficient documentation

## 2024-03-10 DIAGNOSIS — R079 Chest pain, unspecified: Secondary | ICD-10-CM | POA: Insufficient documentation

## 2024-03-10 DIAGNOSIS — M546 Pain in thoracic spine: Secondary | ICD-10-CM | POA: Diagnosis present

## 2024-03-10 DIAGNOSIS — M549 Dorsalgia, unspecified: Secondary | ICD-10-CM

## 2024-03-10 LAB — BASIC METABOLIC PANEL WITH GFR
Anion gap: 11 (ref 5–15)
BUN: 14 mg/dL (ref 6–20)
CO2: 25 mmol/L (ref 22–32)
Calcium: 9.6 mg/dL (ref 8.9–10.3)
Chloride: 101 mmol/L (ref 98–111)
Creatinine, Ser: 0.73 mg/dL (ref 0.44–1.00)
GFR, Estimated: >60 mL/min
Glucose, Bld: 98 mg/dL (ref 70–99)
Potassium: 3.5 mmol/L (ref 3.5–5.1)
Sodium: 137 mmol/L (ref 135–145)

## 2024-03-10 LAB — TROPONIN T, HIGH SENSITIVITY
Troponin T High Sensitivity: <6 ng/L (ref 0–19)
Troponin T High Sensitivity: <6 ng/L (ref 0–19)

## 2024-03-10 LAB — HCG, SERUM, QUALITATIVE: Preg, Serum: NEGATIVE

## 2024-03-10 LAB — CBC
HCT: 38.1 % (ref 36.0–46.0)
Hemoglobin: 13 g/dL (ref 12.0–15.0)
MCH: 29 pg (ref 26.0–34.0)
MCHC: 34.1 g/dL (ref 30.0–36.0)
MCV: 85 fL (ref 80.0–100.0)
Platelets: 274 10*3/uL (ref 150–400)
RBC: 4.48 MIL/uL (ref 3.87–5.11)
RDW: 12.3 % (ref 11.5–15.5)
WBC: 8.4 10*3/uL (ref 4.0–10.5)
nRBC: 0 % (ref 0.0–0.2)

## 2024-03-10 NOTE — ED Triage Notes (Signed)
 Patient arrives POV for left chest pain, right back pain, and shortness of breath. Patient endorses increased pain upon inhalation. Patient reports some cough associated. Patient denies hx of same. Patient endorses she took some tylenol  and motrin .

## 2024-03-10 NOTE — ED Triage Notes (Signed)
 Pt reports SHOB, left sided chest pain that radiates to left shoulder and back pain that started at 0800 this morning.

## 2024-03-11 ENCOUNTER — Emergency Department (HOSPITAL_COMMUNITY)

## 2024-03-11 MED ORDER — METHOCARBAMOL 500 MG PO TABS: 500.0000 mg | ORAL_TABLET | Freq: Two times a day (BID) | ORAL | 0 refills | Status: AC

## 2024-03-11 MED ORDER — IOHEXOL 350 MG/ML SOLN
75.0000 mL | Freq: Once | INTRAVENOUS | Status: AC | PRN
  Administered 2024-03-11: 75 mL via INTRAVENOUS

## 2024-03-11 MED ORDER — OXYCODONE-ACETAMINOPHEN 5-325 MG PO TABS
1.0000 | ORAL_TABLET | Freq: Once | ORAL | Status: AC
  Administered 2024-03-11: 1 via ORAL
  Filled 2024-03-11: qty 1

## 2024-03-11 NOTE — ED Provider Notes (Signed)
 " [place] EMERGENCY DEPARTMENT AT [facility] Provider Note   CSN: [id number] Arrival date & time: 03/10/24  1719     Patient presents with: Chest Pain, Back Pain, and Shortness of Breath   Catherine Simmons is a 39 y.o. female.   Patient to ED for evaluation of sharp pain in the right upper back since yesterday (03/10/24) without injury or fall. No cough or fever. She also reports left anterior chest pain. She reports pain is increased with movement or breathing. No abdominal or flank pain. No history of PE/DVT. Nonsmoker.   The history is provided by the patient. No language interpreter was used.  Chest Pain Associated symptoms: back pain and shortness of breath   Back Pain Associated symptoms: chest pain   Shortness of Breath Associated symptoms: chest pain        Prior to Admission medications  Medication Sig Start Date End Date Taking? Authorizing Provider  amLODipine  (NORVASC ) 10 MG tablet Take 1 tablet (10 mg total) by mouth daily. 11/01/23 03/09/24  Calone, Gregory D, FNP  bictegravir-emtricitabine -tenofovir  AF (BIKTARVY ) 50-200-25 MG TABS tablet Take 1 tablet by mouth daily. 07/12/23   Fleeta Kathie Jomarie LOISE, MD  cabotegravir  & rilpivirine  ER (CABENUVA ) 600 & 900 MG/3ML injection Inject 1 kit into the muscle every 30 (thirty) days. Patient not taking: Reported on 07/12/2023 12/17/22   Waddell Alan PARAS, RPH-CPP  hydrochlorothiazide  (HYDRODIURIL ) 12.5 MG tablet Take 1 tablet (12.5 mg total) by mouth daily. 09/07/23   Calone, Gregory D, FNP  lidocaine  (LIDODERM ) 5 % Place 1 patch onto the skin daily. Remove & Discard patch within 12 hours or as directed by MD Patient not taking: Reported on 07/12/2023 01/17/23   Ruthe Cornet, DO  naproxen  (NAPROSYN ) 500 MG tablet Take 1 tablet (500 mg total) by mouth 2 (two) times daily. 02/05/24   Theadore Ozell HERO, MD  norethindrone  (AYGESTIN ) 5 MG tablet TAKE 3 TABLETS(15 MG) BY MOUTH DAILY Patient not taking: Reported on 07/12/2023 10/28/22    Erik Kieth BROCKS, MD    Allergies: Butorphanol and Stadol [butorphanol tartrate]    Review of Systems  Respiratory:  Positive for shortness of breath.   Cardiovascular:  Positive for chest pain.  Musculoskeletal:  Positive for back pain.    Updated Vital Signs BP (!) 138/100   Pulse 76   Temp 98.8 F (37.1 C) (Oral)   Resp 14   Ht 5' 2 (1.575 m)   Wt 102.1 kg   SpO2 100%   BMI 41.15 kg/m   Physical Exam Vitals and nursing note reviewed.  Constitutional:      Appearance: She is well-developed.  HENT:     Head: Normocephalic.     Mouth/Throat:     Mouth: Mucous membranes are moist.  Cardiovascular:     Rate and Rhythm: Normal rate and regular rhythm.     Heart sounds: No murmur heard. Pulmonary:     Effort: Pulmonary effort is normal.     Breath sounds: Normal breath sounds. No wheezing, rhonchi or rales.  Chest:     Chest wall: No tenderness.  Abdominal:     Palpations: Abdomen is soft.     Tenderness: There is no abdominal tenderness. There is no guarding or rebound.  Musculoskeletal:        General: Normal range of motion.     Cervical back: Normal range of motion and neck supple.     Right lower leg: No edema.     Left  lower leg: No edema.     Comments: Right upper back tender to palpation. No swelling. No calf tenderness or swelling.  Skin:    General: Skin is warm and dry.  Neurological:     General: No focal deficit present.     Mental Status: She is alert and oriented to person, place, and time.     (all labs ordered are listed, but only abnormal results are displayed) Labs Reviewed  BASIC METABOLIC PANEL WITH GFR  CBC  HCG, SERUM, QUALITATIVE  TROPONIN T, HIGH SENSITIVITY  TROPONIN T, HIGH SENSITIVITY    EKG: EKG Interpretation Date/Time:  Friday March 10 2024 17:46:37 EST Ventricular Rate:  79 PR Interval:  158 QRS Duration:  94 QT Interval:  388 QTC Calculation: 444 R Axis:   62  Text Interpretation: Normal sinus rhythm  Minimal voltage criteria for LVH, may be normal variant ( Sokolow-Lyon ) Borderline ECG Confirmed by Midge Golas ([id number]) on 03/11/2024 12:21:01 AM  Radiology: DG Chest 2 View Result Date: 03/10/2024 CLINICAL DATA:  Chest pain. EXAM: CHEST - 2 VIEW COMPARISON:  01/17/2023 FINDINGS: Both lungs are clear. Heart and mediastinum are within normal limits. Trachea is midline. Surgical clips in the abdomen. No large pleural effusions. IMPRESSION: No acute cardiopulmonary disease. Electronically Signed   By: Juliene Balder M.D.   On: 03/10/2024 18:37     Procedures   Medications Ordered in the ED  oxyCODONE -acetaminophen  (PERCOCET/ROXICET) 5-325 MG per tablet 1 tablet (1 tablet Oral Given 03/11/24 0221)  iohexol  (OMNIPAQUE ) 350 MG/ML injection 75 mL (75 mLs Intravenous Contrast Given 03/11/24 0420)    Clinical Course as of 03/11/24 [id number]  Sat Mar 11, 2024  0135 Patient with pain in the torso as detailed in the HPI. Normal VS with mild hypertension - h/o same. Not tachycardic, no hypoxia with O2 sats 100%. Pain addressed.   Feel the upper back pain is reproducible to palpation. Normal EKG, troponin x 2 - doubt chest pain ACS. CXR clear - no PTX, PNA.  Will ambulate for hypoxia and/or tachycardia. Consider further testing with d-dimer vs CTA if either present.  [SU]  [id number] Patient reports Percocet provided some relief. Still appears significantly uncomfortable. Ambulated with pulse ox. No desaturation, but she does become tachycardic - 104. Given intensity of pain, tachycardia, pleuritic component, IUD - will get CTA PE. Patient updated.  [SU]    Clinical Course User Index [SU] Odell Balls, PA-C                                 Medical Decision Making Amount and/or Complexity of Data Reviewed Labs: ordered. Radiology: ordered.  Risk Prescription drug management.        Final diagnoses:  Musculoskeletal back pain  Muscular rigidity and spasm, progressive  Nonspecific chest pain    ED  Discharge Orders     None          Odell Balls RIGGERS 03/11/24 9366    Midge Golas, MD 03/11/24 [phone]  "

## 2024-03-11 NOTE — Discharge Instructions (Signed)
 Evaluation today was overall reassuring.  This is likely muscular pain.  Please treat conservatively with gentle stretching daily, applying ice and you can take ibuprofen  and Tylenol  as needed.  Also sent muscle relaxers to your pharmacy as well.  They will make you drowsy so recommend taking them at night.  Please follow-up your PCP.

## 2024-03-11 NOTE — ED Notes (Signed)
 Pt. Ambulated , O2 was 100 percent  and HR highest was 104

## 2024-03-11 NOTE — ED Provider Notes (Signed)
 Patient signed out to me by Margit Paris, PA.  Here for upper back pain.  Likely musculoskeletal.  CT angio of the chest to rule out PE is pending.  Anticipate discharge if CT is unremarkable. Physical Exam  BP 123/82   Pulse 64   Temp 98.6 F (37 C)   Resp 16   Ht 5' 2 (1.575 m)   Wt 102.1 kg   SpO2 100%   BMI 41.15 kg/m   Physical Exam  Procedures  Procedures  ED Course / MDM   Clinical Course as of 03/11/24 0727  Sat Mar 11, 2024  0135 Patient with pain in the torso as detailed in the HPI. Normal VS with mild hypertension - h/o same. Not tachycardic, no hypoxia with O2 sats 100%. Pain addressed.   Feel the upper back pain is reproducible to palpation. Normal EKG, troponin x 2 - doubt chest pain ACS. CXR clear - no PTX, PNA.  Will ambulate for hypoxia and/or tachycardia. Consider further testing with d-dimer vs CTA if either present.  [SU]  [id number] Patient reports Percocet provided some relief. Still appears significantly uncomfortable. Ambulated with pulse ox. No desaturation, but she does become tachycardic - 104. Given intensity of pain, tachycardia, pleuritic component, IUD - will get CTA PE. Patient updated.  [SU]  0635 Upper back pain. Likely MSK. CT angio chest for PE is pending. Anticipate DC if ct ok.  [JR]    Clinical Course User Index [JR] Shalunda Lindh K, PA-C [SU] Paris Margit, PA-C   Medical Decision Making Amount and/or Complexity of Data Reviewed Labs: ordered. Radiology: ordered.  Risk Prescription drug management.   No acute findings on CT. advised supportive care and NSAIDs for what is likely musculoskeletal pain.  Also sent Robaxin  to her pharmacy.  Advised PCP follow-up.  Discharged good condition.       Lang Norleen POUR, PA-C 03/11/24 9266    Midge Golas, MD 03/11/24 [phone]

## 2024-03-23 ENCOUNTER — Ambulatory Visit: Payer: Self-pay
